# Patient Record
Sex: Female | Born: 1950 | Race: White | Hispanic: No | Smoking: Current every day smoker
Health system: Southern US, Community
[De-identification: ages and names within clinical notes are randomized; demographics above are authoritative.]

## PROBLEM LIST (undated history)

## (undated) DIAGNOSIS — T7840XA Allergy, unspecified, initial encounter: Secondary | ICD-10-CM

## (undated) DIAGNOSIS — I10 Essential (primary) hypertension: Secondary | ICD-10-CM

## (undated) DIAGNOSIS — Z460 Encounter for fitting and adjustment of spectacles and contact lenses: Secondary | ICD-10-CM

## (undated) DIAGNOSIS — C3490 Malignant neoplasm of unspecified part of unspecified bronchus or lung: Secondary | ICD-10-CM

## (undated) HISTORY — DX: Essential (primary) hypertension: I10

## (undated) HISTORY — DX: Allergy, unspecified, initial encounter: T78.40XA

## (undated) HISTORY — PX: ABDOMINAL HYSTERECTOMY: SHX81

## (undated) HISTORY — PX: TUBAL LIGATION: SHX77

## (undated) HISTORY — DX: Malignant neoplasm of unspecified part of unspecified bronchus or lung: C34.90

---

## 1997-07-14 ENCOUNTER — Ambulatory Visit: Admission: RE | Admit: 1997-07-14 | Discharge: 1997-07-14 | Payer: Self-pay | Admitting: Family Medicine

## 1997-11-15 ENCOUNTER — Other Ambulatory Visit: Admission: RE | Admit: 1997-11-15 | Discharge: 1997-11-15 | Payer: Self-pay | Admitting: Obstetrics and Gynecology

## 1998-11-23 ENCOUNTER — Other Ambulatory Visit: Admission: RE | Admit: 1998-11-23 | Discharge: 1998-11-23 | Payer: Self-pay | Admitting: Obstetrics and Gynecology

## 1999-12-28 ENCOUNTER — Other Ambulatory Visit: Admission: RE | Admit: 1999-12-28 | Discharge: 1999-12-28 | Payer: Self-pay | Admitting: Obstetrics and Gynecology

## 2001-02-26 ENCOUNTER — Other Ambulatory Visit: Admission: RE | Admit: 2001-02-26 | Discharge: 2001-02-26 | Payer: Self-pay | Admitting: Obstetrics and Gynecology

## 2002-03-16 ENCOUNTER — Other Ambulatory Visit: Admission: RE | Admit: 2002-03-16 | Discharge: 2002-03-16 | Payer: Self-pay | Admitting: Obstetrics and Gynecology

## 2004-10-10 ENCOUNTER — Ambulatory Visit (HOSPITAL_COMMUNITY): Admission: RE | Admit: 2004-10-10 | Discharge: 2004-10-10 | Payer: Self-pay | Admitting: Obstetrics and Gynecology

## 2004-11-19 ENCOUNTER — Ambulatory Visit: Payer: Self-pay | Admitting: Internal Medicine

## 2004-11-28 ENCOUNTER — Encounter (INDEPENDENT_AMBULATORY_CARE_PROVIDER_SITE_OTHER): Payer: Self-pay | Admitting: Specialist

## 2004-11-28 ENCOUNTER — Encounter: Admission: RE | Admit: 2004-11-28 | Discharge: 2004-11-28 | Payer: Self-pay | Admitting: Surgery

## 2004-11-28 ENCOUNTER — Other Ambulatory Visit: Admission: RE | Admit: 2004-11-28 | Discharge: 2004-11-28 | Payer: Self-pay | Admitting: Interventional Radiology

## 2005-11-07 ENCOUNTER — Encounter: Admission: RE | Admit: 2005-11-07 | Discharge: 2005-11-07 | Payer: Self-pay | Admitting: Surgery

## 2006-02-27 ENCOUNTER — Ambulatory Visit: Payer: Self-pay | Admitting: Internal Medicine

## 2006-05-08 ENCOUNTER — Ambulatory Visit: Payer: Self-pay | Admitting: Internal Medicine

## 2006-08-11 ENCOUNTER — Ambulatory Visit: Payer: Self-pay | Admitting: Internal Medicine

## 2006-11-04 ENCOUNTER — Ambulatory Visit: Payer: Self-pay | Admitting: Internal Medicine

## 2006-11-04 LAB — CONVERTED CEMR LAB
Cholesterol: 228 mg/dL (ref 0–200)
Total CHOL/HDL Ratio: 6.5
Triglycerides: 137 mg/dL (ref 0–149)
VLDL: 27 mg/dL (ref 0–40)

## 2006-11-06 ENCOUNTER — Ambulatory Visit: Payer: Self-pay | Admitting: Internal Medicine

## 2007-01-12 ENCOUNTER — Encounter: Admission: RE | Admit: 2007-01-12 | Discharge: 2007-01-12 | Payer: Self-pay | Admitting: Surgery

## 2007-03-16 ENCOUNTER — Ambulatory Visit: Payer: Self-pay | Admitting: Internal Medicine

## 2008-01-20 ENCOUNTER — Encounter: Admission: RE | Admit: 2008-01-20 | Discharge: 2008-01-20 | Payer: Self-pay | Admitting: Surgery

## 2008-02-05 ENCOUNTER — Encounter: Admission: RE | Admit: 2008-02-05 | Discharge: 2008-02-05 | Payer: Self-pay | Admitting: Obstetrics and Gynecology

## 2008-02-05 ENCOUNTER — Encounter (INDEPENDENT_AMBULATORY_CARE_PROVIDER_SITE_OTHER): Payer: Self-pay | Admitting: Diagnostic Radiology

## 2008-02-23 ENCOUNTER — Ambulatory Visit (HOSPITAL_BASED_OUTPATIENT_CLINIC_OR_DEPARTMENT_OTHER): Admission: RE | Admit: 2008-02-23 | Discharge: 2008-02-23 | Payer: Self-pay | Admitting: Surgery

## 2008-02-23 ENCOUNTER — Encounter: Admission: RE | Admit: 2008-02-23 | Discharge: 2008-02-23 | Payer: Self-pay | Admitting: Surgery

## 2008-02-23 ENCOUNTER — Encounter (INDEPENDENT_AMBULATORY_CARE_PROVIDER_SITE_OTHER): Payer: Self-pay | Admitting: Surgery

## 2008-04-08 ENCOUNTER — Ambulatory Visit: Payer: Self-pay | Admitting: Internal Medicine

## 2008-06-10 HISTORY — PX: BREAST SURGERY: SHX581

## 2008-06-21 ENCOUNTER — Encounter: Admission: RE | Admit: 2008-06-21 | Discharge: 2008-06-21 | Payer: Self-pay | Admitting: Surgery

## 2009-02-22 ENCOUNTER — Encounter (INDEPENDENT_AMBULATORY_CARE_PROVIDER_SITE_OTHER): Payer: Self-pay | Admitting: *Deleted

## 2009-07-10 ENCOUNTER — Encounter: Admission: RE | Admit: 2009-07-10 | Discharge: 2009-07-10 | Payer: Self-pay | Admitting: Obstetrics and Gynecology

## 2009-12-01 ENCOUNTER — Encounter: Admission: RE | Admit: 2009-12-01 | Discharge: 2009-12-01 | Payer: Self-pay | Admitting: Family Medicine

## 2010-01-17 ENCOUNTER — Other Ambulatory Visit: Admission: RE | Admit: 2010-01-17 | Discharge: 2010-01-17 | Payer: Self-pay | Admitting: Interventional Radiology

## 2010-01-17 ENCOUNTER — Encounter: Admission: RE | Admit: 2010-01-17 | Discharge: 2010-01-17 | Payer: Self-pay | Admitting: Internal Medicine

## 2010-10-23 NOTE — Assessment & Plan Note (Signed)
[facility]                            CARDIOLOGY OFFICE NOTE   Hayley Wu, Hayley Wu                      MRN:          [id number]  DATE:11/06/2006                            DOB:          1950-07-30    IDENTIFICATION:  Hayley Wu is a 60 year old woman with hypertension  and tobacco use. I saw her back in March.   When I saw her in March, I increased her Norvasc to 5.   The patient is taking it, otherwise notes no problems. She denies edema.   She is due to be seen by Dr. Gerrit Friends, her thyroid dose has been held down  and her thyroid nodules will be followed this summer.   CURRENT MEDICATIONS:  1. Multivitamin.  2. Synthroid 50 mcg daily.  3. Norvasc 5 mg daily.   REVIEW OF SYSTEMS:  Continues to smoke.   PHYSICAL EXAMINATION:  GENERAL:  The patient is no distress.  VITAL SIGNS:  Blood pressure 150/80 on arrival, 144/84 on my check,  pulse 75, weight 167 down 4 pounds from last visit.  LUNGS:  Clear.  NECK:  JVP is normal.  CARDIAC:  Regular rate and rhythm, S1, S2, no S3. No murmurs.  ABDOMEN:  Benign.  EXTREMITIES:  No edema.   IMPRESSION:  1. Hypertension. Not optimally controlled. I think that she is      probably going higher than the 140s during the day. We will      increase her Norvasc to 10 mg and follow up later this summer.  2. Dyslipidemia. Lipid panel done recently was worse than before with      an LDL of 156. Question if this is related to her thyroid. We will      check a TSH today for follow. We will be in touch with her and      where to proceed.  3. Healthcare maintenance. Counseled on smoking cessation, also I have      given her a prescription for Chantix.   Tentative follow up for six months, but I will be in touch with the  patient and she will call with her blood pressures and we will discuss  her lipids also.     Hayley Riffle, MD, [facility]  Electronically Signed    PVR/MedQ  DD: 11/06/2006  DT: 11/07/2006   Job #: [id number]   cc:   Juluis Mire, M.D.  Velora Heckler, MD

## 2010-10-23 NOTE — Assessment & Plan Note (Signed)
[facility]                            CARDIOLOGY OFFICE NOTE   NAME:Hayley Wu, Hayley Wu                      MRN:          [id number]  DATE:04/08/2008                            DOB:          1951/05/04    IDENTIFICATION:  Hayley Wu is a 60 year old woman with history of  hypertension, tobacco use, and dyslipidemia.  I last saw her back in  October 2008.   In the interval, she has done okay.  Denies chest pain.  No significant  shortness breath.  She exercises about 10-15 minutes a few times per  week.  Continues to smoke about a pack per day, which she has since age  4, has never tried to quit.   CURRENT MEDICATIONS:  1. Synthroid 25 mcg.  2. Norvasc 7.5.  3. Bactrim (sinus infection).  4. Nasonex.   PHYSICAL EXAMINATION:  GENERAL:  The patient is in no distress.  VITAL SIGNS:  Blood pressure 140/70, pulse is 76 and regular, weight  179, which is up from 171 in I last year.  NECK:  JVP is normal.  No bruits.  Prominent nodular right thyroid.  LUNGS:  Clear.  CARDIAC:  Regular rate and rhythm.  S1 and S2.  No S3.  No significant  murmurs.  ABDOMEN:  Benign.  EXTREMITIES:  No edema.   A 12-lead EKG, normal sinus rhythm, 79 beats per minute.   IMPRESSION:  1. Dyslipidemia.  We will check fasting lipids today.  2. Hypertension, fair control.  We  would continue.  3. Health care maintenance.  Counseled again extensively on smoking      cessation, gave her hand out for the program at [facility].  Also encouraged to increase her activity.  4. Thyroid nodule followed by Dr. Gerrit Friends.  No further testing for now      per her report.   I will set followup for 1 year, sooner if problems develop.     Pricilla Riffle, MD, [facility]  Electronically Signed    PVR/MedQ  DD: 04/08/2008  DT: 04/09/2008  Job #: [id number]

## 2010-10-23 NOTE — Assessment & Plan Note (Signed)
[facility]                            CARDIOLOGY OFFICE NOTE   Hayley Wu, Hayley Wu                      MRN:          [id number]  DATE:03/16/2007                            DOB:          May 13, 1951    IDENTIFICATION:  Hayley Wu is a 60 year old woman with a history of  hypertension, tobacco use and dyslipidemia.  I last saw her back in May.   In the interval, she has done okay.  She is now on the Norvasc 7.5 mg.  Her blood pressure appears to be better.  She says it is probably better  since she is down on her Synthroid.   The patient was seen by Dr. Velora Heckler.  Her Synthroid was decreased  to 25 mcg.  She says she feels her throat is a little more full with  this dose.   She is active and denies chest pain.  She is still smoking one pack per  day.   CURRENT MEDICATIONS:  1. Women's multivitamins.  2. Synthroid 25 mcg daily.  3. Norvasc 7.5 mg daily.   PHYSICAL EXAMINATION:  GENERAL:  The patient is in no distress.  VITAL SIGNS:  Blood pressure 120/70, pulse 83, weight 171 pounds, up  from 167 pounds.  NECK:  No bruits.  LUNGS:  Clear.  No rales.  HEART:  A regular rate and rhythm.  S1 and S2.  No S3.  ABDOMEN:  Benign.  EXTREMITIES:  No edema.   IMPRESSION:  1. Hypertension, under adequate control:  Would continue.  She does      not want to increase her dose further.  2. Dyslipidemia:  Her last lipid panel was not very good back in May.      An LDL of 157,  HDL 35, total of 228.  We talked about diet.      Again, I would put this behind stopping smoking.  Will reassess      with a Lipo-Medi next summer.  3. Health care maintenance:  Counseled at length about tobacco and      also gave her information regarding a program at [facility] for tobacco cessation.   I will set followup for the end of October with a Lipo-Medi the same  day.     Pricilla Riffle, MD, [facility]  Electronically Signed    PVR/MedQ  DD:  03/16/2007  DT: 03/17/2007  Job #: [id number]   cc:   Juluis Mire, M.D.  Velora Heckler, MD

## 2010-10-23 NOTE — Op Note (Signed)
NAME:  Hayley Wu, Hayley Wu               ACCOUNT NO.:  [id number]   MEDICAL RECORD NO.:  [id number]          PATIENT TYPE:  AMB   LOCATION:  [facility]                          FACILITY:  [facility]   PHYSICIAN:  Velora Heckler, MD      DATE OF BIRTH:  1951-03-25   DATE OF PROCEDURE:  DATE OF DISCHARGE:                               OPERATIVE REPORT   PREOPERATIVE DIAGNOSIS:  Granular cell tumor, right breast.   POSTOPERATIVE DIAGNOSIS:  Granular cell tumor, right breast.   PROCEDURE:  Right partial mastectomy with wire localization.   SURGEON:  Velora Heckler, MD, FACS   ANESTHESIA:  General Per Dr. Autumn Patty.   ESTIMATED BLOOD LOSS:  Minimal.   PREPARATION:  Betadine.   COMPLICATIONS:  None.   INDICATIONS:  The patient is a 60 year old white female followed in my  practice for bilateral thyroid nodules.  The patient had a routine  screening mammogram in late August 2009.  This showed an abnormality in  the upper portion of the right breast.  Core needle biopsy was performed  and shows a granular cell tumor.  The patient now comes to surgery for  definitive excision with wire localization.   OPERATIVE PROCEDURE:  Procedure was done in OR #8 at the [facility].  The patient was brought to the operating room, placed  in supine position on the operating room table.  Following  administration of general anesthesia, the patient was positioned and  then prepped and draped in usual strict aseptic fashion.  Previous  guidewire has been inserted from the upper medial quadrant of the right  breast.  Skin is incised with a #15 blade in a curvilinear fashion.  Dissection was carried into the subcutaneous tissues and hemostasis  obtained with the electrocautery.  A core of tissue was excised around  the guidewire until nearly reaching the chest wall.  At this point, the  mass is palpable.  It measures approximately 1.5 cm in greatest  dimension.  It appears to be fixed to the  underlying pectoralis major  muscle.  Using the electrocautery for hemostasis, margin of tissue was  excised surrounding the mass including approximately a 1 cm segment of  pectoralis major muscle.  The entire mass was excised in this fashion.  Guidewire appears to penetrate the mass and extends into the pectoralis  major muscle.  Entire specimen was excised and a mammogram of this  specimen was provided to radiology using the Faxitron.  This confirms  the presence of the lesion with the previous marking clip and guidewire  to be present within the specimen.  This was reviewed by Dr. Anselmo Pickler.   Good hemostasis was noted throughout the wound.  Subcutaneous tissues  were closed with interrupted 3-0 Vicryl sutures.  Skin was closed with a  running 4-0 subcuticular Vicryl suture.  Wound was dressed with  Dermabond as she has a latex allergy.   The specimen was oriented with sutures marking the deep margin, the  medial margin, and the lateral margin.  It was submitted to pathology  for  review.   The patient was awakened from anesthesia and brought to the recovery  room in stable condition.  The patient tolerated the procedure well.      Velora Heckler, MD  Electronically Signed     TMG/MEDQ  D:  02/23/2008  T:  02/24/2008  Job:  [id number]   cc:   Daryl Eastern, M.D.  Juluis Mire, M.D.

## 2010-10-26 NOTE — Assessment & Plan Note (Signed)
[facility]                            CARDIOLOGY OFFICE NOTE   Hayley Wu, Hayley Wu                      MRN:          [id number]  DATE:08/11/2006                            DOB:          12-Nov-1950    Hayley Wu is a 60 year old woman with hypertension and tobacco use.  I saw her in November.   When i saw her last, I had her on Norvasc 2.5 per day.  Actually on my  check, her blood pressure was a little high (158/88).   In the interval, she has been doing okay.  She still smokes, trying to  cut back.   CURRENT MEDICATIONS:  1. Norvasc 2.5.  2. Women multivitamin.  3. Synthroid 50 mcg.   REVIEW OF SYSTEMS:  Being followed by Dr. Gerrit Friends for a thyroid nodule.  Lipids also done by Dr. Arelia Sneddon.   PHYSICAL EXAMINATION:  On exam, the patient is in no distress.  Blood  pressure 149/85, pulse is 94.  On my check, blood pressure 136/82, pulse  94.  Weight 171.  LUNGS:  Clear.  NECK:  Thyroid slightly prominent.  CARDIAC EXAM:  Regular rate and rhythm.  S1-S2.  No S3, no murmurs.  ABDOMEN:  Benign.  EXTREMITIES:  No edema.  Good pulses.   IMPRESSION:  1. Hypertension:  I would increase her Norvasc to 5 and really treat      it with a goal of 110 to 130 for a systolic.  We will follow up in      a few months.  2. Thyroid:  Being followed by Dr. Gerrit Friends.  3. Health care maintenance:  We will follow up with Dr. Arelia Sneddon and her      lipid panel.  Encouraged her to continue staying active with      walking.     Pricilla Riffle, MD, [facility]  Electronically Signed    PVR/MedQ  DD: 08/11/2006  DT: 08/12/2006  Job #: [id number]   cc:   Juluis Mire, M.D.

## 2010-10-26 NOTE — Assessment & Plan Note (Signed)
[facility]                              CARDIOLOGY OFFICE NOTE   Hayley Wu, Hayley Wu Hayley Wu                      MRN:          [id number]  DATE:02/27/2006                            DOB:          [birth year]    IDENTIFICATION:  Ms. Hayley Wu is a 60 year old who was referred by Dr.  Arelia Sneddon for evaluation of hypertension.   HISTORY OF PRESENT ILLNESS:  The patient has been seen by Dr. Arelia Sneddon in the  past.  Blood pressure has been marked to be elevated on a few occasions  2006, September, at 156/92, another visit in 2007 168/90 and then 156/92.  Note here by Dr. Juanda Chance Lina Sar), blood pressure in June of 206 was  110/70.   On talking with the patient, she is under increased stress.  Otherwise, she  is active.  She played some golf, denies shortness of breath, no chest  pressure.   ALLERGIES:  1. AMOXICILLIN.  2. E-MYCIN.  3. PREDNISONE.   CURRENT MEDICATIONS:  Synthroid 50 mcg daily.   PAST MEDICAL HISTORY:  1. Hypothyroidism.  2. Hypertension.   PAST SURGICAL HISTORY:  1. Status post tubal ligation.  2. Status post hysterectomy, oophorectomy 1997.   SOCIAL HISTORY:  Patient is recently widowed this summer.  She smokes about  a pack per day for 35 years, does not drink.   FAMILY HISTORY:  Father with MI at age 96, brother with an MI at age 6,  mother died of lung cancer at age 75, father died, again, of heart problems  at age 11.  Four sisters, two other brothers.   REVIEW OF SYSTEMS:  Recent lipid panel:  Total cholesterol 201,  triglycerides of 165, HDL of 40, LDL of 128.  Talking to the patient about  her diet, breakfast is oatmeal with brown sugar, lunch is usually a  sandwich, dinner grilled meat.  She does eat junk food in between though.   PHYSICAL EXAM:  On exam, the patient is in no distress.  Blood pressure  162/86 in left arm, pulse is 87, weight 181.  NECK:  No bruits, thyroid is prominent, JVP is normal.  CARDIAC EXAM:   Regular rate and rhythm, S1, S2.  No S3, no significant  murmurs.  LUNGS:  Clear.  ABDOMEN:  Benign.  No hepatomegaly.  EXTREMITIES:  Good distal pulses, no edema.   A 12 lead EKG shows normal sinus rhythm at 87 b.p.m.   IMPRESSION:  1. Hypertension.  Again, she is high today and I think it has been on      several occasions.  I would recommend beginning therapy with Norvasc      2.5 mg daily, I will follow up in about six weeks' time.  Again, if she      were to get the weight down that may improve her blood pressure, but I      would treat for now.  2. Dyslipidemia.  In the setting with family history of hypertension again      cannot be ignored, along with her family history as well.  She  would      like to try diet changes and lifestyle changes and I encourage this.  I      have given her American Heart Association web site and the information      through them.  She is entering some diet program at work that is      starting up.  I would like to check the lipids in about 4-6 months'      time.   Again, follow up as noted above.                                Pricilla Riffle, MD, [facility]    PVR/MedQ  DD:  02/27/2006  DT:  03/01/2006  Job #:  [id number]   cc:   Juluis Mire, M.D.

## 2010-10-26 NOTE — Assessment & Plan Note (Signed)
[facility]                            CARDIOLOGY OFFICE NOTE   Hayley, Wu Hayley Wu                      MRN:          [id number]  DATE:05/08/2006                            DOB:          [birth year]    IDENTIFICATION:  Hayley Wu is a woman I saw back in September.  She  is a 60 year old with history of hypertension.   When I saw her last, I put her on a little bit of Norvasc.  She seems to  have tolerated it well.  Denies edema.   Note, at work she actually is involved in quite a bit of preventative  measures.  They have a workout facility now.  There are nutrition  classes.  She actually has lost quite a bit of weight.   She is without complaints.   CURRENT MEDICATIONS:  1. Norvasc 2.5 daily.  2. Multivitamin.  3. Synthroid 50 mcg daily.   PHYSICAL EXAMINATION:  Patient is in no distress.  Blood pressure in arrival 128/84, on my check 158/88.  Pulse is 80.  Weight 168, down from 181 on my check last visit.  LUNGS:  Clear.  CARDIAC:  Regular rate and rhythm.  S1 and S2.  No S3.  No murmurs.  ABDOMEN:  Benign.  EXTREMITIES:  No edema.   IMPRESSION:  1. Hypertension.  I think it is better overall.  I would continue on      the current regimen and follow up in several months.  Again, she      can have this checked locally as well.  2. Dyslipidemia.  She is making changes in diet.  Her weight has gone      down.  I would not recheck for now.  3. I will set to see her back in the spring, sooner if problems      develop.     Pricilla Riffle, MD, [facility]  Electronically Signed    PVR/MedQ  DD: 05/08/2006  DT: 05/09/2006  Job #: [id number]   cc:   Juluis Mire, M.D.

## 2011-02-13 ENCOUNTER — Other Ambulatory Visit: Payer: Self-pay | Admitting: Internal Medicine

## 2011-02-13 DIAGNOSIS — E042 Nontoxic multinodular goiter: Secondary | ICD-10-CM

## 2011-03-04 ENCOUNTER — Ambulatory Visit
Admission: RE | Admit: 2011-03-04 | Discharge: 2011-03-04 | Disposition: A | Discharge status: Home / Self Care | Payer: BC Managed Care – PPO | Source: Ambulatory Visit | Attending: Internal Medicine | Admitting: Internal Medicine

## 2011-03-04 DIAGNOSIS — E042 Nontoxic multinodular goiter: Secondary | ICD-10-CM

## 2011-03-13 LAB — CBC
HCT: 47.2 — ABNORMAL HIGH
MCHC: 34.2
MCV: 88.2
RBC: 5.35 — ABNORMAL HIGH
RDW: 13.1

## 2011-03-13 LAB — URINALYSIS, ROUTINE W REFLEX MICROSCOPIC
Hgb urine dipstick: NEGATIVE
Protein, ur: NEGATIVE
Specific Gravity, Urine: 1.003 — ABNORMAL LOW
Urobilinogen, UA: 0.2

## 2011-03-13 LAB — BASIC METABOLIC PANEL
BUN: 8
Creatinine, Ser: 0.61
GFR calc Af Amer: >60
Glucose, Bld: 98
Potassium: 4
Sodium: 142

## 2011-03-13 LAB — DIFFERENTIAL
Lymphocytes Relative: 27
Monocytes Absolute: 0.5
Monocytes Relative: 5
Neutrophils Relative %: 67

## 2011-03-13 LAB — URINE MICROSCOPIC-ADD ON

## 2011-03-13 LAB — PROTIME-INR
INR: 0.9
Prothrombin Time: 12.5

## 2011-09-13 ENCOUNTER — Ambulatory Visit (INDEPENDENT_AMBULATORY_CARE_PROVIDER_SITE_OTHER): Payer: BC Managed Care – PPO | Admitting: Physician Assistant

## 2011-09-13 VITALS — BP 151/81 | HR 94 | Temp 98.3°F | Resp 16 | Ht 63.0 in | Wt 174.0 lb

## 2011-09-13 DIAGNOSIS — H8090 Unspecified otosclerosis, unspecified ear: Secondary | ICD-10-CM

## 2011-09-13 DIAGNOSIS — H6690 Otitis media, unspecified, unspecified ear: Secondary | ICD-10-CM

## 2011-09-13 DIAGNOSIS — H9209 Otalgia, unspecified ear: Secondary | ICD-10-CM

## 2011-09-13 MED ORDER — LEVOFLOXACIN 500 MG PO TABS: 500.0000 mg | ORAL_TABLET | Freq: Every day | ORAL | Status: AC

## 2011-09-13 NOTE — Progress Notes (Signed)
Patient ID: Hayley Wu MRN: [id number], DOB: June 08, 1951, 61 y.o. Date of Encounter: 09/13/2011, 7:10 PM  Primary Physician: No primary provider on file.  Chief Complaint:  Chief Complaint  Patient presents with  . Otalgia    x 1 days    HPI: 61 y.o. year old female presents with a 2 day history of left otalgia. Symptoms began with nasal congestion, sore throat, and cough. Afebrile. Nasal congestion thick and green/yellow. Cough is mild and nonproductive. Ear painful and  feels full, leading to sensation of muffled hearing. Over night she noticed some drainage coming from the left ear. Left sided hearing is muffled. She does have a history of a perforated left TM years ago, but stats she was told this resolved without surgery. Has tried OTC cold preps without success. No GI complaints. Appetite normal. Smokes tobacco daily.  No sick contacts, recent antibiotics, or recent travels.    No past medical history on file.   Home Meds: Prior to Admission medications   Medication Sig Start Date End Date Taking? Authorizing Provider  amLODipine (NORVASC) 5 MG tablet Take 7.5 mg by mouth daily.   Yes Historical Provider, MD  levofloxacin (LEVAQUIN) 500 MG tablet Take 1 tablet (500 mg total) by mouth daily. 09/13/11 09/23/11  Sondra Barges, PA-C    Allergies:  Allergies  Allergen Reactions  . Amoxicillin   . Erythromycin Diarrhea  . Prednisone     History   Social History  . Marital Status: Widowed    Spouse Name: N/A    Number of Children: N/A  . Years of Education: N/A   Occupational History  . Not on file.   Social History Main Topics  . Smoking status: Current Everyday Smoker  . Smokeless tobacco: Not on file  . Alcohol Use: Not on file  . Drug Use: Not on file  . Sexually Active: Not on file   Other Topics Concern  . Not on file   Social History Narrative  . No narrative on file     Review of Systems: Constitutional: negative for chills, fever, night sweats or  weight changes HEENT: see above Respiratory: negative for hemoptysis, wheezing, or shortness of breath Abdominal: negative for abdominal pain, nausea, vomiting or diarrhea Dermatological: negative for rash Neurologic: negative for headache   Physical Exam: Blood pressure 151/81, pulse 94, temperature 98.3 F (36.8 C), temperature source Oral, resp. rate 16, height 5\' 3"  (1.6 m), weight 174 lb (78.926 kg)., Body mass index is 30.82 kg/(m^2). General: Well developed, well nourished, in no acute distress. Head: Normocephalic, atraumatic, eyes without discharge, sclera non-icteric, nares are congested. Bilateral auditory canals clear. Left TM with perforation, erythematous, dull, and purulent effusion behind. Contralateral TM pearly grey with reflective cone of light, no effusion or perforation visualized. Oral cavity moist, dentition normal. Posterior pharynx with post nasal drip and mild erythema. No peritonsillar abscess or tonsillar exudate. Neck: Supple. No thyromegaly. Full ROM. No lymphadenopathy. Lungs: Clear bilaterally to auscultation without wheezes, rales, or rhonchi. Breathing is unlabored.  Heart: RRR with S1 S2. No murmurs, rubs, or gallops appreciated. Msk:  Strength and tone normal for age. Extremities: No clubbing or cyanosis. No edema. Neuro: Alert and oriented X 3. Moves all extremities spontaneously. CNII-XII grossly in tact. Psych:  Responds to questions appropriately with a normal affect.     ASSESSMENT AND PLAN:  61 y.o. year old female with acute otitis media of left ear with perforation. -Levaquin 500 mg #10 1 po  daily no RF -Mucinex  -Tylenol prn -Rest/fluids -Recheck 48-72 hours to check for healing of perforation, if persists consider ENT -RTC precautions  Signed, Eula Listen, PA-C 09/13/2011 7:10 PM

## 2011-09-23 ENCOUNTER — Ambulatory Visit (INDEPENDENT_AMBULATORY_CARE_PROVIDER_SITE_OTHER): Payer: BC Managed Care – PPO | Admitting: Internal Medicine

## 2011-09-23 VITALS — BP 147/81 | HR 88 | Temp 98.3°F | Resp 16 | Ht 63.0 in | Wt 177.8 lb

## 2011-09-23 DIAGNOSIS — I10 Essential (primary) hypertension: Secondary | ICD-10-CM | POA: Insufficient documentation

## 2011-09-23 DIAGNOSIS — J309 Allergic rhinitis, unspecified: Secondary | ICD-10-CM

## 2011-09-23 DIAGNOSIS — H6980 Other specified disorders of Eustachian tube, unspecified ear: Secondary | ICD-10-CM

## 2011-09-23 MED ORDER — FLUTICASONE PROPIONATE 50 MCG/ACT NA SUSP: 2.0000 | Freq: Every day | NASAL | Status: DC

## 2011-09-23 NOTE — Patient Instructions (Signed)
Take your Zyrtec twice daily for 3 days.  Use your Flonase everyday.  It is fine to use Nasal Saline spray if you wish.  Your symptoms should improve within 7 days.  Allergies, Generic Allergies may happen from anything your body is sensitive to. This may be food, medicines, pollens, chemicals, and nearly anything around you in everyday life that produces allergens. An allergen is anything that causes an allergy producing substance. Heredity is often a factor in causing these problems. This means you may have some of the same allergies as your parents. Food allergies happen in all age groups. Food allergies are some of the most severe and life threatening. Some common food allergies are cow's milk, seafood, eggs, nuts, wheat, and soybeans. SYMPTOMS   Swelling around the mouth.   An itchy red rash or hives.   Vomiting or diarrhea.   Difficulty breathing.  SEVERE ALLERGIC REACTIONS ARE LIFE-THREATENING. This reaction is called anaphylaxis. It can cause the mouth and throat to swell and cause difficulty with breathing and swallowing. In severe reactions only a trace amount of food (for example, peanut oil in a salad) may cause death within seconds. Seasonal allergies occur in all age groups. These are seasonal because they usually occur during the same season every year. They may be a reaction to molds, grass pollens, or tree pollens. Other causes of problems are house dust mite allergens, pet dander, and mold spores. The symptoms often consist of nasal congestion, a runny itchy nose associated with sneezing, and tearing itchy eyes. There is often an associated itching of the mouth and ears. The problems happen when you come in contact with pollens and other allergens. Allergens are the particles in the air that the body reacts to with an allergic reaction. This causes you to release allergic antibodies. Through a chain of events, these eventually cause you to release histamine into the blood stream.  Although it is meant to be protective to the body, it is this release that causes your discomfort. This is why you were given anti-histamines to feel better. If you are unable to pinpoint the offending allergen, it may be determined by skin or blood testing. Allergies cannot be cured but can be controlled with medicine. Hay fever is a collection of all or some of the seasonal allergy problems. It may often be treated with simple over-the-counter medicine such as diphenhydramine. Take medicine as directed. Do not drink alcohol or drive while taking this medicine. Check with your caregiver or package insert for child dosages. If these medicines are not effective, there are many new medicines your caregiver can prescribe. Stronger medicine such as nasal spray, eye drops, and corticosteroids may be used if the first things you try do not work well. Other treatments such as immunotherapy or desensitizing injections can be used if all else fails. Follow up with your caregiver if problems continue. These seasonal allergies are usually not life threatening. They are generally more of a nuisance that can often be handled using medicine. HOME CARE INSTRUCTIONS   If unsure what causes a reaction, keep a diary of foods eaten and symptoms that follow. Avoid foods that cause reactions.   If hives or rash are present:   Take medicine as directed.   You may use an over-the-counter antihistamine (diphenhydramine) for hives and itching as needed.   Apply cold compresses (cloths) to the skin or take baths in cool water. Avoid hot baths or showers. Heat will make a rash and itching worse.  If you are severely allergic:   Following a treatment for a severe reaction, hospitalization is often required for closer follow-up.   Wear a medic-alert bracelet or necklace stating the allergy.   You and your family must learn how to give adrenaline or use an anaphylaxis kit.   If you have had a severe reaction, always  carry your anaphylaxis kit or EpiPen with you. Use this medicine as directed by your caregiver if a severe reaction is occurring. Failure to do so could have a fatal outcome.  SEEK MEDICAL CARE IF:  You suspect a food allergy. Symptoms generally happen within 30 minutes of eating a food.   Your symptoms have not gone away within 2 days or are getting worse.   You develop new symptoms.   You want to retest yourself or your child with a food or drink you think causes an allergic reaction. Never do this if an anaphylactic reaction to that food or drink has happened before. Only do this under the care of a caregiver.  SEEK IMMEDIATE MEDICAL CARE IF:   You have difficulty breathing, are wheezing, or have a tight feeling in your chest or throat.   You have a swollen mouth, or you have hives, swelling, or itching all over your body.   You have had a severe reaction that has responded to your anaphylaxis kit or an EpiPen. These reactions may return when the medicine has worn off. These reactions should be considered life threatening.  MAKE SURE YOU:   Understand these instructions.   Will watch your condition.   Will get help right away if you are not doing well or get worse.  Document Released: 08/20/2002 Document Revised: 05/16/2011 Document Reviewed: 01/25/2008 [facility] Patient Information 2012 [place], [place].

## 2011-09-23 NOTE — Progress Notes (Signed)
  Subjective:    Patient ID: Hayley Wu, female    DOB: 10/26/1950, 61 y.o.   MRN: [id number]  HPI  Hayley Wu is here for a follow-up on her left ear infection.  She states she has popping in her ears at times, no drainage or fever.  She has a history of allergies and has not been taking any antihistamines for this but has used Flonase in the past with good results.  She has no cough or wheezing in her chest.  She continues to smoke.    Review of Systems  All other systems reviewed and are negative.  Negative except as noted in HPI     Objective:   Physical Exam  Vitals reviewed. Constitutional: She is oriented to person, place, and time. She appears well-developed and well-nourished.  HENT:  Head: Normocephalic.  Right Ear: External ear normal.  Mouth/Throat: Oropharynx is clear and moist. No oropharyngeal exudate.       Left TM is clear and intact, slightly injected but no perf seen  Eyes:       Conjunctiva injected  Neck: Neck supple.  Cardiovascular: Normal rate, regular rhythm and normal heart sounds.   Pulmonary/Chest: Effort normal and breath sounds normal. She has no wheezes. She has no rales.  Lymphadenopathy:    She has no cervical adenopathy.  Neurological: She is alert and oriented to person, place, and time.  Skin: Skin is warm and dry.  Psychiatric: She has a normal mood and affect. Her behavior is normal.          Assessment & Plan:  Left Otitis Media has resolved ETD:  Flonase prescribed. Allergic Rhinitis:  Advised to take Zyrtec 10 mg BID for 2-3 days then once daily for at least 2 weeks.  RTC if symptoms persist despite medical treatment.  AVS printed and given pt

## 2011-10-18 ENCOUNTER — Other Ambulatory Visit: Payer: Self-pay | Admitting: Internal Medicine

## 2011-10-18 DIAGNOSIS — E042 Nontoxic multinodular goiter: Secondary | ICD-10-CM

## 2011-10-23 ENCOUNTER — Ambulatory Visit
Admission: RE | Admit: 2011-10-23 | Discharge: 2011-10-23 | Disposition: A | Discharge status: Home / Self Care | Payer: BC Managed Care – PPO | Source: Ambulatory Visit | Attending: Internal Medicine | Admitting: Internal Medicine

## 2011-10-23 DIAGNOSIS — E042 Nontoxic multinodular goiter: Secondary | ICD-10-CM

## 2012-08-07 ENCOUNTER — Ambulatory Visit: Payer: BC Managed Care – PPO | Admitting: Family Medicine

## 2012-08-07 VITALS — BP 135/77 | HR 98 | Temp 98.7°F | Resp 18 | Ht 63.5 in | Wt 186.0 lb

## 2012-08-07 DIAGNOSIS — R0981 Nasal congestion: Secondary | ICD-10-CM

## 2012-08-07 DIAGNOSIS — H9202 Otalgia, left ear: Secondary | ICD-10-CM

## 2012-08-07 DIAGNOSIS — H9209 Otalgia, unspecified ear: Secondary | ICD-10-CM

## 2012-08-07 DIAGNOSIS — J3489 Other specified disorders of nose and nasal sinuses: Secondary | ICD-10-CM

## 2012-08-07 DIAGNOSIS — J019 Acute sinusitis, unspecified: Secondary | ICD-10-CM

## 2012-08-07 MED ORDER — LEVOFLOXACIN 500 MG PO TABS: 500.0000 mg | ORAL_TABLET | Freq: Every day | ORAL | Status: DC

## 2012-08-07 NOTE — Progress Notes (Signed)
[facility]:  Office Visit  Chief Complaint:  Chief Complaint  Patient presents with  . Otalgia    1 week  . Cough  . URI  . Sore Throat    HPI: Hayley Wu is a 62 y.o. female who complains of  1 week h/o left ear pain, sinus congestion, tickle in back of throat, denies fevers, chills. [name] was sick, he is 4 months. Tried tussin , nasal spray and also dimetapp. + tobacco use.  Past Medical History  Diagnosis Date  . Allergy   . Hypertension    Past Surgical History  Procedure Laterality Date  . Breast surgery    . Abdominal hysterectomy     History   Social History  . Marital Status: Widowed    Spouse Name: N/A    Number of Children: N/A  . Years of Education: N/A   Social History Main Topics  . Smoking status: Current Every Day Smoker  . Smokeless tobacco: None  . Alcohol Use: No  . Drug Use: No  . Sexually Active: No   Other Topics Concern  . None   Social History Narrative  . None   Family History  Problem Relation Age of Onset  . Cancer Mother   . Hypertension Father   . Heart attack Father   . Hypertension Sister   . Hypertension Brother   . Heart attack Brother   . Cancer Daughter     thyroid  . Diabetes Maternal Grandfather    Allergies  Allergen Reactions  . Amoxicillin   . Erythromycin Diarrhea  . Prednisone    Prior to Admission medications   Medication Sig Start Date End Date Taking? Authorizing Provider  amLODipine (NORVASC) 5 MG tablet Take 7.5 mg by mouth daily.   Yes Historical Provider, MD  fluticasone (FLONASE) 50 MCG/ACT nasal spray Place 2 sprays into the nose daily. 09/23/11 09/22/12  Rickard Patience, PA-C     ROS: The patient denies fevers, chills, night sweats, unintentional weight loss, chest pain, palpitations, wheezing, dyspnea on exertion, nausea, vomiting, abdominal pain, dysuria, hematuria, melena, numbness, weakness, or tingling.   All other systems have been reviewed and were otherwise  negative with the exception of those mentioned in the HPI and as above.    PHYSICAL EXAM: Filed Vitals:   08/07/12 1508  BP: 135/77  Pulse: 98  Temp: 98.7 F (37.1 C)  Resp: 18   Filed Vitals:   08/07/12 1508  Height: 5' 3.5" (1.613 m)  Weight: 186 lb (84.369 kg)   Body mass index is 32.43 kg/(m^2).  General: Alert, no acute distress HEENT:  Normocephalic, atraumatic, oropharynx patent. + scarring left TM, no ear infection. + sinus tenderness, erythematous throat.  Cardiovascular:  Regular rate and rhythm, no rubs murmurs or gallops.  No Carotid bruits, radial pulse intact. No pedal edema.  Respiratory: Clear to auscultation bilaterally.  No wheezes, rales, or rhonchi.  No cyanosis, no use of accessory musculature GI: No organomegaly, abdomen is soft and non-tender, positive bowel sounds.  No masses. Skin: No rashes. Neurologic: Facial musculature symmetric. Psychiatric: Patient is appropriate throughout our interaction. Lymphatic: No cervical lymphadenopathy Musculoskeletal: Gait intact.   LABS: Results for orders placed during the hospital encounter of 02/23/08  URINALYSIS, ROUTINE W REFLEX MICROSCOPIC      Result Value Range   Color, Urine YELLOW     APPearance CLEAR     Specific Gravity, Urine 1.003 (*)    pH 7.0  Glucose, UA NEGATIVE     Hgb urine dipstick NEGATIVE     Bilirubin Urine NEGATIVE     Ketones, ur NEGATIVE     Protein, ur NEGATIVE     Urobilinogen, UA 0.2     Nitrite NEGATIVE     Leukocytes, UA TRACE (*)   URINE MICROSCOPIC-ADD ON      Result Value Range   Squamous Epithelial / LPF RARE     WBC, UA 0-2     RBC / HPF 0-2    BASIC METABOLIC PANEL      Result Value Range   Sodium 142     Potassium 4.0     Chloride 106     CO2 27     Glucose, Bld 98     BUN 8     Creatinine, Ser 0.61     Calcium 9.4     GFR calc non Af Amer >60     GFR calc Af Amer       Value: >60            The eGFR has been calculated     using the MDRD equation.      This calculation has not been     validated in all clinical  CBC      Result Value Range   WBC 10.4     RBC 5.35 (*)    Hemoglobin 16.1 (*)    HCT 47.2 (*)    MCV 88.2     MCHC 34.2     RDW 13.1     Platelets 196    DIFFERENTIAL      Result Value Range   Neutrophils Relative 67     Neutro Abs 7.0     Lymphocytes Relative 27     Lymphs Abs 2.8     Monocytes Relative 5     Monocytes Absolute 0.5     Eosinophils Relative 1     Eosinophils Absolute 0.1     Basophils Relative 0     Basophils Absolute 0.0    PROTIME-INR      Result Value Range   Prothrombin Time 12.5     INR 0.9       EKG/XRAY:   Primary read interpreted by Dr. Conley Rolls at [facility].   ASSESSMENT/PLAN: Encounter Diagnoses  Name Primary?  . Acute sinusitis Yes  . Sinus congestion   . Otalgia of left ear    Rx levaquin Patient to use her flonase F/u prn    Donna Snooks PHUONG, DO 08/07/2012 3:23 PM

## 2012-11-11 ENCOUNTER — Other Ambulatory Visit: Payer: Self-pay | Admitting: Internal Medicine

## 2012-11-11 DIAGNOSIS — E049 Nontoxic goiter, unspecified: Secondary | ICD-10-CM

## 2012-11-23 ENCOUNTER — Ambulatory Visit
Admission: RE | Admit: 2012-11-23 | Discharge: 2012-11-23 | Disposition: A | Discharge status: Home / Self Care | Payer: BC Managed Care – PPO | Source: Ambulatory Visit | Attending: Internal Medicine | Admitting: Internal Medicine

## 2012-11-23 DIAGNOSIS — E049 Nontoxic goiter, unspecified: Secondary | ICD-10-CM

## 2013-11-23 ENCOUNTER — Ambulatory Visit (INDEPENDENT_AMBULATORY_CARE_PROVIDER_SITE_OTHER): Payer: Self-pay | Admitting: Family Medicine

## 2013-11-23 ENCOUNTER — Ambulatory Visit: Payer: Self-pay

## 2013-11-23 ENCOUNTER — Ambulatory Visit (INDEPENDENT_AMBULATORY_CARE_PROVIDER_SITE_OTHER): Payer: Self-pay

## 2013-11-23 VITALS — BP 144/82 | HR 106 | Temp 98.3°F | Resp 18 | Wt 191.6 lb

## 2013-11-23 DIAGNOSIS — S59909A Unspecified injury of unspecified elbow, initial encounter: Secondary | ICD-10-CM

## 2013-11-23 DIAGNOSIS — S59919A Unspecified injury of unspecified forearm, initial encounter: Secondary | ICD-10-CM

## 2013-11-23 DIAGNOSIS — S59912A Unspecified injury of left forearm, initial encounter: Secondary | ICD-10-CM

## 2013-11-23 DIAGNOSIS — S6990XA Unspecified injury of unspecified wrist, hand and finger(s), initial encounter: Secondary | ICD-10-CM

## 2013-11-23 DIAGNOSIS — S59911A Unspecified injury of right forearm, initial encounter: Secondary | ICD-10-CM

## 2013-11-23 DIAGNOSIS — S52599A Other fractures of lower end of unspecified radius, initial encounter for closed fracture: Secondary | ICD-10-CM

## 2013-11-23 DIAGNOSIS — S52501A Unspecified fracture of the lower end of right radius, initial encounter for closed fracture: Secondary | ICD-10-CM

## 2013-11-23 MED ORDER — IBUPROFEN 200 MG PO TABS: 400.0000 mg | ORAL_TABLET | Freq: Once | ORAL | Status: DC

## 2013-11-23 MED ORDER — HYDROCODONE-ACETAMINOPHEN 5-325 MG PO TABS: 1.0000 | ORAL_TABLET | Freq: Three times a day (TID) | ORAL | Status: DC | PRN

## 2013-11-23 NOTE — Patient Instructions (Signed)
Leave the splint in place until you are seen by orthopedics- I will get this scheduled in the next day or two.  I will give you a call tomorrow. If you do not hear from me please let me know!  You can use the vicodin as needed for pain- remember this will make you sleepy

## 2013-11-23 NOTE — Progress Notes (Signed)
[facility] [address], [place] [id number] [phone]  Date:  11/23/2013   Name:  Hayley MeringMary E Cappella   DOB:  1950/07/15   MRN:  [id number]  PCP:  No PCP Per Patient    Chief Complaint: Arm Injury   History of Present Illness:  Hayley Wu is a 63 y.o. very pleasant female patient who presents with the following:  About 2 hours ago she tripped, fell back and landed on her left wrist in a FOOSH type injury. She is otherwise uninjured.  She takes norvasc for her HTN, is otherwise generally healthy.  She is afraid the wrist is broken- it is swollen and very painful.    Patient Active Problem List   Diagnosis Date Noted  . Hypertension 09/23/2011    Past Medical History  Diagnosis Date  . Allergy   . Hypertension     Past Surgical History  Procedure Laterality Date  . Breast surgery    . Abdominal hysterectomy      History  Substance Use Topics  . Smoking status: Current Every Day Smoker  . Smokeless tobacco: Not on file  . Alcohol Use: No    Family History  Problem Relation Age of Onset  . Cancer Mother   . Hypertension Father   . Heart attack Father   . Hypertension Sister   . Hypertension Brother   . Heart attack Brother   . Cancer Daughter     thyroid  . Diabetes Maternal Grandfather     Allergies  Allergen Reactions  . Amoxicillin   . Erythromycin Diarrhea  . Prednisone     Medication list has been reviewed and updated.  Current Outpatient Prescriptions on File Prior to Visit  Medication Sig Dispense Refill  . amLODipine (NORVASC) 5 MG tablet Take 7.5 mg by mouth daily.      . fluticasone (FLONASE) 50 MCG/ACT nasal spray Place 2 sprays into the nose daily.  16 g  3   No current facility-administered medications on file prior to visit.    Review of Systems:  As per HPI- otherwise negative.   Physical Examination: Filed Vitals:   11/23/13 1617  BP: 144/82  Pulse: 106  Temp: 98.3 F (36.8 C)  Resp: 18    Filed Vitals:   11/23/13 1617  Weight: 191 lb 9.6 oz (86.909 kg)   Body mass index is 33.4 kg/(m^2). Ideal Body Weight:    GEN: WDWN, NAD, Non-toxic, A & O x 3, overweight, looks well HEENT: Atraumatic, Normocephalic. Neck supple. No masses, No LAD. Ears and Nose: No external deformity. CV: RRR, No M/G/R. No JVD. No thrill. No extra heart sounds. PULM: CTA B, no wheezes, crackles, rhonchi. No retractions. No resp. distress. No accessory muscle use. EXTR: No c/c/e NEURO Normal gait.  PSYCH: Normally interactive. Conversant. Not depressed or anxious appearing.  Calm demeanor.  Left forearm: she is tender over the distal forearm and appears to have a broken wrist.  Normal perfusion and cap refill, normal pulses in his wrist and normal sensation of the hand No evidence of shoulder or elbow fracture or dislocation  [facility] reading (PRIMARY) by  Dr. Patsy Lageropland. Left wrist: angulated, communiated distal radius fracture Left forearm: as above, elbow is ok  LEFT WRIST - COMPLETE 3+ VIEW  COMPARISON: None.  FINDINGS:  Reverse Barton fracture of the distal radius with anterior  displacement of distal radial fracture fragments and the carpus. The  fracture is comminuted with transverse component  in the metaphysis  and sagittal/oblique components extending into the distal articular  surface of the radius.  Osteoarthritis of the first carpometacarpal articulation. Mild  lucency centrally in the scaphoid probably represents a geode.  IMPRESSION:  1. Reverse Barton fracture.  LEFT FOREARM - 2 VIEW  COMPARISON: None.  FINDINGS:  There is a comminuted fracture the distal radius which enters the  articular surface. The radiocarpal joint is intact. No evidence of  elbow fracture or dislocation.  IMPRESSION:  Comminuted fracture of the distal radius.    Assessment and Plan: Injury of left lower arm - Plan: DG Wrist Complete Left, DG Forearm Left  Injury of right lower arm - Plan: ibuprofen  (ADVIL,MOTRIN) tablet 400 mg, CANCELED: DG Wrist Complete Right, CANCELED: DG Forearm Right  Fracture of radius, distal, right, closed - Plan: HYDROcodone-acetaminophen (NORCO/VICODIN) 5-325 MG per tablet  Distal radius fracture. Placed in a sugar tong splint and sling. vicodin to use prn pain She has seen Dr. Amanda PeaGramig in the past- years ago.  Would be happy to see him again if possible but is also willing to see someone else. Schedule asap Signed Abbe AmsterdamJessica Copland, MD

## 2013-11-24 ENCOUNTER — Telehealth: Payer: Self-pay

## 2013-11-24 DIAGNOSIS — S59212D Salter-Harris Type I physeal fracture of lower end of radius, left arm, subsequent encounter for fracture with routine healing: Secondary | ICD-10-CM

## 2013-11-24 NOTE — Telephone Encounter (Signed)
Called [facility] per Dr. Cyndie Chimeopland's request to make an appt for the left distal radius fx on Hayley Wu. Made an appointment for 1:45 on 11/25/13.  Pt notified of appointment. Referrals fyi

## 2013-11-24 NOTE — Telephone Encounter (Signed)
Patient called to ask Ladona Ridgelaylor what her appointment time and date for ortho was, she received a voicemail from her. Dr. Patsy Lageropland needs to put referrals in epic. So this would have been worked on appropriately for the patient.   Cell phone not working.  Home: [phone]

## 2013-11-25 ENCOUNTER — Encounter (HOSPITAL_BASED_OUTPATIENT_CLINIC_OR_DEPARTMENT_OTHER)
Admission: RE | Admit: 2013-11-25 | Discharge: 2013-11-25 | Disposition: A | Discharge status: Home / Self Care | Payer: Self-pay | Source: Ambulatory Visit | Attending: Orthopedic Surgery | Admitting: Orthopedic Surgery

## 2013-11-25 ENCOUNTER — Other Ambulatory Visit: Payer: Self-pay | Admitting: Orthopedic Surgery

## 2013-11-25 ENCOUNTER — Encounter (HOSPITAL_BASED_OUTPATIENT_CLINIC_OR_DEPARTMENT_OTHER): Payer: Self-pay | Admitting: *Deleted

## 2013-11-25 ENCOUNTER — Other Ambulatory Visit: Payer: Self-pay

## 2013-11-25 DIAGNOSIS — Z01810 Encounter for preprocedural cardiovascular examination: Secondary | ICD-10-CM | POA: Insufficient documentation

## 2013-11-25 LAB — BASIC METABOLIC PANEL
BUN: 9 mg/dL (ref 6–23)
CHLORIDE: 101 meq/L (ref 96–112)
CO2: 24 meq/L (ref 19–32)
Calcium: 9.5 mg/dL (ref 8.4–10.5)
Creatinine, Ser: 0.61 mg/dL (ref 0.50–1.10)
GFR calc Af Amer: >90 mL/min (ref >90)
GFR calc non Af Amer: >90 mL/min (ref >90)
GLUCOSE: 100 mg/dL — AB (ref 70–99)
POTASSIUM: 4.1 meq/L (ref 3.7–5.3)
SODIUM: 141 meq/L (ref 137–147)

## 2013-11-25 NOTE — H&P (Signed)
Hayley Wu is an 63 y.o. female.   Chief Complaint: Left Wrist Pain  HPI: Patient presents with a chief complaint of patient slipped and fell 11/23/2013 and sustained a displaced distal radius fracture [facility] and [facility] obtained.  She is placed in a sugar tong splint and given a sling.  She reports that the pain in the wrist is constant moderate with throbbing and aching there.  Some associated swelling of the fingertips was gone we'll bit worse over the last 24 hours, but no numbness or tingling.  She has been icing the region as well.  Past Medical History  Diagnosis Date  . Allergy   . Hypertension   . Contact lens/glasses fitting     Past Surgical History  Procedure Laterality Date  . Abdominal hysterectomy    . Breast surgery  2010    rt lumpectomy-neg  . Tubal ligation      Family History  Problem Relation Age of Onset  . Cancer Mother   . Hypertension Father   . Heart attack Father   . Hypertension Sister   . Hypertension Brother   . Heart attack Brother   . Cancer Daughter     thyroid  . Diabetes Maternal Grandfather    Social History:  reports that she has been smoking.  She does not have any smokeless tobacco history on file. She reports that she does not drink alcohol or use illicit drugs.  Allergies:  Allergies  Allergen Reactions  . Erythromycin Diarrhea  . Amoxicillin Rash  . Prednisone Rash    No prescriptions prior to admission    No results found for this or any previous visit (from the past 48 hour(s)). Dg Forearm Left  11/23/2013   CLINICAL DATA:  Fall, pain  EXAM: LEFT FOREARM - 2 VIEW  COMPARISON:  None.  FINDINGS: There is a comminuted fracture the distal radius which enters the articular surface. The radiocarpal joint is intact. No evidence of elbow fracture or dislocation.  IMPRESSION: Comminuted fracture of the distal radius.   Electronically Signed   By: Genevive BiStewart  Edmunds M.D.   On: 11/23/2013 19:00   Dg Wrist Complete  Left  11/23/2013   CLINICAL DATA:  Fall.  Wrist pain and fracture.  EXAM: LEFT WRIST - COMPLETE 3+ VIEW  COMPARISON:  None.  FINDINGS: Reverse Barton fracture of the distal radius with anterior displacement of distal radial fracture fragments and the carpus. The fracture is comminuted with transverse component in the metaphysis and sagittal/oblique components extending into the distal articular surface of the radius.  Osteoarthritis of the first carpometacarpal articulation. Mild lucency centrally in the scaphoid probably represents a geode.  IMPRESSION: 1. Reverse Barton fracture.   Electronically Signed   By: Herbie BaltimoreWalt  Liebkemann M.D.   On: 11/23/2013 19:00    Review of Systems  Constitutional: Negative.   HENT: Negative.   Eyes: Negative.   Respiratory: Negative.   Cardiovascular:       HTN  Gastrointestinal: Negative.   Musculoskeletal: Positive for joint pain and myalgias.  Skin: Negative.   Neurological: Negative.   Endo/Heme/Allergies: Negative.   Psychiatric/Behavioral: Positive for depression. The patient is nervous/anxious.     Height 5' 3.5" (1.613 m), weight 86.183 kg (190 lb). Physical Exam  Constitutional: She is oriented to person, place, and time. She appears well-developed and well-nourished.  HENT:  Head: Normocephalic and atraumatic.  Eyes: Pupils are equal, round, and reactive to light.  Neck: Normal range of motion.  Neck supple.  Cardiovascular: Intact distal pulses.   Respiratory: Effort normal.  Musculoskeletal: She exhibits tenderness.  The patient is 1-2+ swelling to the fingertips was some bruising going out to the fingertips.  Normal sensation of the fingertips.  Her wrist is 2+ swollen and tender to palpation.  I did not go through a range of motion secondary to the fracture.  Her elbow is nontender.    Neurological: She is alert and oriented to person, place, and time.  Skin: Skin is warm and dry.  Psychiatric: She has a normal mood and affect. Her behavior is  normal. Judgment and thought content normal.    [facility] showing a displaced volar Barton's fracture with ulnar neutral on the AP.  Assessment/Plan Assess: Displaced volar Barton's fracture, left wrist and a right hand dominant woman.  Plan: Options were discussed at length.  We'll schedule her set up for open reduction internal fixation using a DVR plate, preferably sometime in the next day or 2.  Healing time will be a couple of months.  Today we'll put her in a Velfoam wrist forearm splint.  She has Norco for pain control.  The patient is aware of the risks and benefits of surgery  Colbey Wirtanen R 11/25/2013, 6:03 PM

## 2013-11-25 NOTE — Progress Notes (Signed)
Here for [id number]-bmet

## 2013-11-26 ENCOUNTER — Ambulatory Visit (HOSPITAL_BASED_OUTPATIENT_CLINIC_OR_DEPARTMENT_OTHER): Payer: Self-pay | Admitting: Anesthesiology

## 2013-11-26 ENCOUNTER — Encounter (HOSPITAL_BASED_OUTPATIENT_CLINIC_OR_DEPARTMENT_OTHER)
Admission: RE | Disposition: A | Discharge status: Home / Self Care | Payer: Self-pay | Source: Ambulatory Visit | Attending: Orthopedic Surgery

## 2013-11-26 ENCOUNTER — Encounter (HOSPITAL_BASED_OUTPATIENT_CLINIC_OR_DEPARTMENT_OTHER): Payer: Self-pay | Admitting: Anesthesiology

## 2013-11-26 ENCOUNTER — Ambulatory Visit (HOSPITAL_BASED_OUTPATIENT_CLINIC_OR_DEPARTMENT_OTHER)
Admission: RE | Admit: 2013-11-26 | Discharge: 2013-11-26 | Disposition: A | Discharge status: Home / Self Care | Payer: Self-pay | Source: Ambulatory Visit | Attending: Orthopedic Surgery | Admitting: Orthopedic Surgery

## 2013-11-26 DIAGNOSIS — S52562A Barton's fracture of left radius, initial encounter for closed fracture: Secondary | ICD-10-CM

## 2013-11-26 DIAGNOSIS — I10 Essential (primary) hypertension: Secondary | ICD-10-CM | POA: Insufficient documentation

## 2013-11-26 DIAGNOSIS — W010XXA Fall on same level from slipping, tripping and stumbling without subsequent striking against object, initial encounter: Secondary | ICD-10-CM | POA: Insufficient documentation

## 2013-11-26 DIAGNOSIS — Z87891 Personal history of nicotine dependence: Secondary | ICD-10-CM | POA: Insufficient documentation

## 2013-11-26 DIAGNOSIS — S52599A Other fractures of lower end of unspecified radius, initial encounter for closed fracture: Secondary | ICD-10-CM | POA: Insufficient documentation

## 2013-11-26 DIAGNOSIS — S52501A Unspecified fracture of the lower end of right radius, initial encounter for closed fracture: Secondary | ICD-10-CM

## 2013-11-26 HISTORY — DX: Encounter for fitting and adjustment of spectacles and contact lenses: Z46.0

## 2013-11-26 HISTORY — PX: OPEN REDUCTION INTERNAL FIXATION (ORIF) DISTAL RADIAL FRACTURE: SHX5989

## 2013-11-26 LAB — POCT HEMOGLOBIN-HEMACUE: Hemoglobin: 12.8 g/dL (ref 12.0–15.0)

## 2013-11-26 SURGERY — OPEN REDUCTION INTERNAL FIXATION (ORIF) DISTAL RADIUS FRACTURE
Anesthesia: General | Site: Wrist | Laterality: Left

## 2013-11-26 MED ORDER — BUPIVACAINE-EPINEPHRINE (PF) 0.5% -1:200000 IJ SOLN
INTRAMUSCULAR | Status: DC | PRN
  Administered 2013-11-26: 30 mL via PERINEURAL

## 2013-11-26 MED ORDER — MIDAZOLAM HCL 5 MG/5ML IJ SOLN
INTRAMUSCULAR | Status: DC | PRN
  Administered 2013-11-26: 2 mg via INTRAVENOUS

## 2013-11-26 MED ORDER — VANCOMYCIN HCL 1000 MG IV SOLR
1000.0000 mg | INTRAVENOUS | Status: DC | PRN
  Administered 2013-11-26: 1000 mg via INTRAVENOUS

## 2013-11-26 MED ORDER — LIDOCAINE HCL (CARDIAC) 20 MG/ML IV SOLN
INTRAVENOUS | Status: DC | PRN
  Administered 2013-11-26: 30 mg via INTRAVENOUS

## 2013-11-26 MED ORDER — ONDANSETRON HCL 4 MG/2ML IJ SOLN: 4.0000 mg | Freq: Four times a day (QID) | INTRAMUSCULAR | Status: DC | PRN

## 2013-11-26 MED ORDER — HYDROCODONE-ACETAMINOPHEN 5-325 MG PO TABS: 1.0000 | ORAL_TABLET | Freq: Three times a day (TID) | ORAL | Status: DC | PRN

## 2013-11-26 MED ORDER — METOCLOPRAMIDE HCL 5 MG PO TABS: 5.0000 mg | ORAL_TABLET | Freq: Three times a day (TID) | ORAL | Status: DC | PRN

## 2013-11-26 MED ORDER — OXYCODONE HCL 5 MG/5ML PO SOLN: 5.0000 mg | Freq: Once | ORAL | Status: DC | PRN

## 2013-11-26 MED ORDER — DEXTROSE-NACL 5-0.45 % IV SOLN: INTRAVENOUS | Status: DC

## 2013-11-26 MED ORDER — FENTANYL CITRATE 0.05 MG/ML IJ SOLN
INTRAMUSCULAR | Status: AC
  Filled 2013-11-26: qty 2

## 2013-11-26 MED ORDER — BUPIVACAINE HCL (PF) 0.5 % IJ SOLN
INTRAMUSCULAR | Status: AC
  Filled 2013-11-26: qty 30

## 2013-11-26 MED ORDER — ONDANSETRON HCL 4 MG/2ML IJ SOLN
INTRAMUSCULAR | Status: DC | PRN
  Administered 2013-11-26: 4 mg via INTRAVENOUS

## 2013-11-26 MED ORDER — ONDANSETRON HCL 4 MG PO TABS: 4.0000 mg | ORAL_TABLET | Freq: Four times a day (QID) | ORAL | Status: DC | PRN

## 2013-11-26 MED ORDER — CHLORHEXIDINE GLUCONATE 4 % EX LIQD: 60.0000 mL | Freq: Once | CUTANEOUS | Status: DC

## 2013-11-26 MED ORDER — MEPERIDINE HCL 25 MG/ML IJ SOLN: 6.2500 mg | INTRAMUSCULAR | Status: DC | PRN

## 2013-11-26 MED ORDER — OXYCODONE HCL 5 MG PO TABS: 5.0000 mg | ORAL_TABLET | Freq: Once | ORAL | Status: DC | PRN

## 2013-11-26 MED ORDER — ONDANSETRON HCL 4 MG/2ML IJ SOLN
4.0000 mg | Freq: Once | INTRAMUSCULAR | Status: DC | PRN
Start: 2013-11-26 — End: 2013-11-26

## 2013-11-26 MED ORDER — PROPOFOL 10 MG/ML IV BOLUS
INTRAVENOUS | Status: AC
  Filled 2013-11-26: qty 60

## 2013-11-26 MED ORDER — FENTANYL CITRATE 0.05 MG/ML IJ SOLN
INTRAMUSCULAR | Status: DC | PRN
  Administered 2013-11-26 (×2): 50 ug via INTRAVENOUS

## 2013-11-26 MED ORDER — FENTANYL CITRATE 0.05 MG/ML IJ SOLN
50.0000 ug | INTRAMUSCULAR | Status: DC | PRN
  Administered 2013-11-26: 50 ug via INTRAVENOUS

## 2013-11-26 MED ORDER — BUPIVACAINE HCL (PF) 0.25 % IJ SOLN
INTRAMUSCULAR | Status: AC
  Filled 2013-11-26: qty 30

## 2013-11-26 MED ORDER — MIDAZOLAM HCL 2 MG/2ML IJ SOLN
1.0000 mg | INTRAMUSCULAR | Status: DC | PRN
  Administered 2013-11-26: 2 mg via INTRAVENOUS

## 2013-11-26 MED ORDER — VANCOMYCIN HCL IN DEXTROSE 1-5 GM/200ML-% IV SOLN: 1000.0000 mg | INTRAVENOUS | Status: DC

## 2013-11-26 MED ORDER — METOCLOPRAMIDE HCL 5 MG/ML IJ SOLN: 5.0000 mg | Freq: Three times a day (TID) | INTRAMUSCULAR | Status: DC | PRN

## 2013-11-26 MED ORDER — PROPOFOL INFUSION 10 MG/ML OPTIME
INTRAVENOUS | Status: DC | PRN
  Administered 2013-11-26: 100 ug/kg/min via INTRAVENOUS

## 2013-11-26 MED ORDER — MIDAZOLAM HCL 2 MG/2ML IJ SOLN
INTRAMUSCULAR | Status: AC
  Filled 2013-11-26: qty 2

## 2013-11-26 MED ORDER — FENTANYL CITRATE 0.05 MG/ML IJ SOLN
INTRAMUSCULAR | Status: AC
  Filled 2013-11-26: qty 6

## 2013-11-26 MED ORDER — LACTATED RINGERS IV SOLN
INTRAVENOUS | Status: DC
  Administered 2013-11-26 (×2): via INTRAVENOUS

## 2013-11-26 MED ORDER — HYDROMORPHONE HCL PF 1 MG/ML IJ SOLN: 0.2500 mg | INTRAMUSCULAR | Status: DC | PRN

## 2013-11-26 MED ORDER — VANCOMYCIN HCL IN DEXTROSE 1-5 GM/200ML-% IV SOLN
INTRAVENOUS | Status: AC
  Filled 2013-11-26: qty 200

## 2013-11-26 SURGICAL SUPPLY — 72 items
BANDAGE ELASTIC 3 VELCRO ST LF (GAUZE/BANDAGES/DRESSINGS) ×3 IMPLANT
BANDAGE ELASTIC 4 VELCRO ST LF (GAUZE/BANDAGES/DRESSINGS) IMPLANT
BIT DRILL 2 FAST STEP (BIT) ×3 IMPLANT
BIT DRILL 2.5X4 QC (BIT) ×3 IMPLANT
BLADE SURG 15 STRL LF DISP TIS (BLADE) ×2 IMPLANT
BLADE SURG 15 STRL SS (BLADE) ×4
BNDG COHESIVE 3X5 TAN STRL LF (GAUZE/BANDAGES/DRESSINGS) ×3 IMPLANT
BNDG ESMARK 4X9 LF (GAUZE/BANDAGES/DRESSINGS) ×3 IMPLANT
CANISTER SUCT 1200ML W/VALVE (MISCELLANEOUS) IMPLANT
COVER TABLE BACK 60X90 (DRAPES) ×3 IMPLANT
DECANTER SPIKE VIAL GLASS SM (MISCELLANEOUS) IMPLANT
DRAPE EXTREMITY T 121X128X90 (DRAPE) ×3 IMPLANT
DRAPE OEC MINIVIEW 54X84 (DRAPES) IMPLANT
DRAPE SURG 17X23 STRL (DRAPES) ×6 IMPLANT
DRAPE U 20/CS (DRAPES) IMPLANT
DURAPREP 26ML APPLICATOR (WOUND CARE) ×3 IMPLANT
ELECT REM PT RETURN 9FT ADLT (ELECTROSURGICAL) ×3
ELECTRODE REM PT RTRN 9FT ADLT (ELECTROSURGICAL) ×1 IMPLANT
GAUZE SPONGE 4X4 12PLY STRL (GAUZE/BANDAGES/DRESSINGS) ×3 IMPLANT
GAUZE XEROFORM 1X8 LF (GAUZE/BANDAGES/DRESSINGS) ×3 IMPLANT
GLOVE BIO SURGEON STRL SZ7.5 (GLOVE) IMPLANT
GLOVE BIO SURGEON STRL SZ8.5 (GLOVE) IMPLANT
GLOVE BIOGEL PI IND STRL 7.0 (GLOVE) ×1 IMPLANT
GLOVE BIOGEL PI IND STRL 8 (GLOVE) ×1 IMPLANT
GLOVE BIOGEL PI IND STRL 9 (GLOVE) ×1 IMPLANT
GLOVE BIOGEL PI INDICATOR 7.0 (GLOVE) ×2
GLOVE BIOGEL PI INDICATOR 8 (GLOVE) ×2
GLOVE BIOGEL PI INDICATOR 9 (GLOVE) ×2
GLOVE SURG SS PI 6.5 STRL IVOR (GLOVE) ×3 IMPLANT
GLOVE SURG SS PI 7.5 STRL IVOR (GLOVE) ×3 IMPLANT
GLOVE SURG SS PI 8.0 STRL IVOR (GLOVE) ×3 IMPLANT
GOWN STRL REUS W/ TWL LRG LVL3 (GOWN DISPOSABLE) ×2 IMPLANT
GOWN STRL REUS W/ TWL XL LVL3 (GOWN DISPOSABLE) ×1 IMPLANT
GOWN STRL REUS W/TWL LRG LVL3 (GOWN DISPOSABLE) ×4
GOWN STRL REUS W/TWL XL LVL3 (GOWN DISPOSABLE) ×2
NEEDLE HYPO 25X1 1.5 SAFETY (NEEDLE) IMPLANT
NS IRRIG 1000ML POUR BTL (IV SOLUTION) ×3 IMPLANT
PACK BASIN DAY SURGERY FS (CUSTOM PROCEDURE TRAY) ×3 IMPLANT
PAD CAST 3X4 CTTN HI CHSV (CAST SUPPLIES) ×1 IMPLANT
PAD CAST 4YDX4 CTTN HI CHSV (CAST SUPPLIES) IMPLANT
PADDING CAST ABS 4INX4YD NS (CAST SUPPLIES) ×2
PADDING CAST ABS COTTON 4X4 ST (CAST SUPPLIES) ×1 IMPLANT
PADDING CAST COTTON 3X4 STRL (CAST SUPPLIES) ×2
PADDING CAST COTTON 4X4 STRL (CAST SUPPLIES)
PEG SUBCHONDRAL SMOOTH 2.0X18 (Peg) ×6 IMPLANT
PEG SUBCHONDRAL SMOOTH 2.0X20 (Peg) ×6 IMPLANT
PENCIL BUTTON HOLSTER BLD 10FT (ELECTRODE) ×3 IMPLANT
PLATE STAN 24.4X59.5 LT (Plate) ×3 IMPLANT
SCREW BN 12X3.5XNS CORT TI (Screw) ×2 IMPLANT
SCREW BN 13X3.5XNS CORT TI (Screw) ×1 IMPLANT
SCREW CORT 3.5X12 (Screw) ×4 IMPLANT
SCREW CORT 3.5X13 (Screw) ×2 IMPLANT
SCREW CORT 3.5X15 (Screw) ×3 IMPLANT
SCREW CORT 3.5X16 LNG (Screw) ×3 IMPLANT
SHEET MEDIUM DRAPE 40X70 STRL (DRAPES) ×3 IMPLANT
SLEEVE SCD COMPRESS KNEE MED (MISCELLANEOUS) IMPLANT
SPLINT PLASTER CAST XFAST 3X15 (CAST SUPPLIES) IMPLANT
SPLINT PLASTER XTRA FASTSET 3X (CAST SUPPLIES)
SPONGE LAP 4X18 X RAY DECT (DISPOSABLE) IMPLANT
STOCKINETTE 4X48 STRL (DRAPES) IMPLANT
SUCTION FRAZIER TIP 10 FR DISP (SUCTIONS) IMPLANT
SUT ETHILON 3 0 PS 1 (SUTURE) IMPLANT
SUT VIC AB 2-0 SH 27 (SUTURE) ×2
SUT VIC AB 2-0 SH 27XBRD (SUTURE) ×1 IMPLANT
SUT VIC AB 3-0 SH 27 (SUTURE) ×2
SUT VIC AB 3-0 SH 27X BRD (SUTURE) ×1 IMPLANT
SYR BULB 3OZ (MISCELLANEOUS) ×3 IMPLANT
SYR CONTROL 10ML LL (SYRINGE) IMPLANT
TOWEL OR 17X24 6PK STRL BLUE (TOWEL DISPOSABLE) ×6 IMPLANT
TUBE CONNECTING 20'X1/4 (TUBING)
TUBE CONNECTING 20X1/4 (TUBING) IMPLANT
UNDERPAD 30X30 INCONTINENT (UNDERPADS AND DIAPERS) ×3 IMPLANT

## 2013-11-26 NOTE — Anesthesia Procedure Notes (Addendum)
Anesthesia Regional Block:  Supraclavicular block  Pre-Anesthetic Checklist: ,, timeout performed, Correct Patient, Correct Site, Correct Laterality, Correct Procedure, Correct Position, site marked, Risks and benefits discussed,  Surgical consent,  Pre-op evaluation,  At surgeon's request and post-op pain management  Laterality: Left  Prep: chloraprep       Needles:   Needle Type: Echogenic Stimulator Needle     Needle Length: 10cm 10 cm Needle Gauge: 21 and 21 G    Additional Needles:  Procedures: ultrasound guided (picture in chart) and nerve stimulator Supraclavicular block  Nerve Stimulator or Paresthesia:  Response: 0.4 mA,   Additional Responses:   Narrative:  Start time: 11/26/2013 8:15 AM End time: 11/26/2013 8:25 AM Injection made incrementally with aspirations every 5 mL.  Performed by: Personally  Anesthesiologist: Arta BruceKevin Ossey MD  Additional Notes: Monitors applied. Patient sedated. Sterile prep and drape,hand hygiene and sterile gloves were used. Relevant anatomy identified.Needle position confirmed.Local anesthetic injected incrementally after negative aspiration. Local anesthetic spread visualized around nerve(s). Vascular puncture avoided. No complications. Image printed for medical record.The patient tolerated the procedure well.        Performed by: [place] DesanctisLINKA, MARGARET L    Procedure Name: MAC Date/Time: 11/26/2013 9:09 AM Performed by:  DesanctisLINKA, MARGARET L Pre-anesthesia Checklist: Patient identified, Emergency Drugs available, Suction available, Patient being monitored and Timeout performed Patient Re-evaluated:Patient Re-evaluated prior to inductionOxygen Delivery Method: Circle system utilized and Simple face mask Comments: Spontaneous respirations maintained throughout.

## 2013-11-26 NOTE — Interval H&P Note (Signed)
History and Physical Interval Note:  11/26/2013 8:48 AM  Hayley Wu  has presented today for surgery, with the diagnosis of LEFT DISTAL RADIUS FRACTURE   The various methods of treatment have been discussed with the patient and family. After consideration of risks, benefits and other options for treatment, the patient has consented to  Procedure(s): OPEN REDUCTION INTERNAL FIXATION (ORIF) LEFT  DISTAL RADIUS  (Left) as a surgical intervention .  The patient's history has been reviewed, patient examined, no change in status, stable for surgery.  I have reviewed the patient's chart and labs.  Questions were answered to the patient's satisfaction.     Nestor LewandowskyOWAN,FRANK J

## 2013-11-26 NOTE — Anesthesia Preprocedure Evaluation (Signed)
Anesthesia Evaluation  Patient identified by MRN, date of birth, ID band Patient awake    Reviewed: Allergy & Precautions, H&P , NPO status , Patient's Chart, lab work & pertinent test results  Airway Mallampati: I TM Distance: >3 FB Neck ROM: Full    Dental   Pulmonary Current Smoker,          Cardiovascular hypertension, Pt. on medications     Neuro/Psych    GI/Hepatic   Endo/Other    Renal/GU      Musculoskeletal   Abdominal   Peds  Hematology   Anesthesia Other Findings   Reproductive/Obstetrics                           Anesthesia Physical Anesthesia Plan  ASA: II  Anesthesia Plan: General   Post-op Pain Management:    Induction: Intravenous  Airway Management Planned: LMA  Additional Equipment:   Intra-op Plan:   Post-operative Plan: Extubation in OR  Informed Consent: I have reviewed the patients History and Physical, chart, labs and discussed the procedure including the risks, benefits and alternatives for the proposed anesthesia with the patient or authorized representative who has indicated his/her understanding and acceptance.     Plan Discussed with: CRNA and Surgeon  Anesthesia Plan Comments:         Anesthesia Quick Evaluation  

## 2013-11-26 NOTE — Op Note (Signed)
Pre Op Dx: Comminuted displaced left volar Barton's distal radius fracture, intra-articular  Post Op Dx: Same  Procedure: Open reduction internal fixation left distal radius fracture using a Biomet standard DVR plate  Surgeon: Nestor LewandowskyFrank J Rowan, MD  Assistant: Tomi LikensEric K. Gaylene BrooksPhillips PA-C  (present throughout entire procedure and necessary for timely completion of the procedure)  Anesthesia: Axillary block with MAC  EBL: Minimal  Fluids: 800 cc  Tourniquet Time: 44 min  Indications: 63 year old woman who slipped and fell 2 days ago sustaining a comminuted intra-articular displaced left distal radius volar Barton's fracture. Was seen at urgent care placed in a splint and evaluated in our office a few hours later and scheduled for urgent open reduction internal fixation to decrease pain increase function and give her a more congruent joint. The wrist joint was subluxed off about 50% volarly. The risks and benefits of surgery discussed at length and the patient is prepared for surgical intervention.  Procedure: Patient was identified by arm band and vancomycin preoperative IV antibiotics in the holding area at the day surgery Center. Axillary block was also placed in the holding area. She is taken to operating room 8 where the appropriate anesthetic monitors were attached and IV sedation administered. A tourniquet was applied to the left forearm proximally in the left upper extremity prepped and draped in usual sterile fashion from the fingertips to the tourniquet. A timeout procedure was performed. The limb was wrapped with an Esmarch bandage and the tourniquet inflated to 250 mm of mercury. We began the operation by making a longitudinal skin incision along the FCR tendon going from the wrist proximally for 8 cm. Small bleeders were identified and cauterized the tendon sheath over the FCR tendon was incised and FCR was retracted laterally. We then incised the dorsal sheath of the tendon taking us down to the  supinator over the fracture site the supinator was then detached from its radial border and reflected ulnarly exposing the fracture site itself. Using traction and manual pressure the fracture was reducible. We then selected a standard left lamina DVR plate and fixed it to the proximal radius through the glide hole. Using the mini C-arm we then took images in the AP and lateral plane confirming good reduction and adjusting the position of the plate longitudinally on the radius. The glide screw was then fixed and the distal row of fixation screws filled with pegs to support the subchondral bone of the distal radius. 3 more bicortical proximal screws were applied C-arm images were taken in the AP and lateral plane confirming good reduction of the fracture and the wrist is no longer subluxed and now out to length ulnar -2-3 mm which was appropriate. At this point a tourniquet was let down small bleeders identified and cauterized the wound irrigated out normal saline solution and closed in layers with 3-0 Vicryl subcutaneous suture and 3-0 Vicryl subcuticular suture followed by a dressing of Xerofoam 4 x 4 dressing sponges 2 inch web roll Ace wrap and a Velcro splint. The patient was then taken to the recovery without difficulty appear

## 2013-11-26 NOTE — Progress Notes (Signed)
Assisted Dr. Ossey with left, ultrasound guided, supraclavicular block. Side rails up, monitors on throughout procedure. See vital signs in flow sheet. Tolerated Procedure well. 

## 2013-11-26 NOTE — Anesthesia Postprocedure Evaluation (Signed)
Anesthesia Post Note  Patient: Hayley Wu  Procedure(s) Performed: Procedure(s) (LRB): OPEN REDUCTION INTERNAL FIXATION (ORIF) LEFT  DISTAL RADIUS  (Left)  Anesthesia type: general  Patient location: PACU  Post pain: Pain level controlled  Post assessment: Patient's Cardiovascular Status Stable  Last Vitals:  Filed Vitals:   11/26/13 1236  BP: 122/59  Pulse: 70  Temp: 36.6 C  Resp: 14    Post vital signs: Reviewed and stable  Level of consciousness: sedated  Complications: No apparent anesthesia complications

## 2013-11-26 NOTE — Transfer of Care (Signed)
Immediate Anesthesia Transfer of Care Note  Patient: Hayley Wu  Procedure(s) Performed: Procedure(s): OPEN REDUCTION INTERNAL FIXATION (ORIF) LEFT  DISTAL RADIUS  (Left)  Patient Location: PACU  Anesthesia Type:MAC  Level of Consciousness: awake, alert , oriented and patient cooperative  Airway & Oxygen Therapy: Patient Spontanous Breathing and Patient connected to face mask oxygen  Post-op Assessment: Report given to PACU RN and Post -op Vital signs reviewed and stable  Post vital signs: Reviewed and stable  Complications: No apparent anesthesia complications

## 2013-11-26 NOTE — Discharge Instructions (Addendum)
Radial Fracture You have a broken bone (fracture) of the forearm. This is the part of your arm between the elbow and your wrist. Your forearm is made up of two bones. These are the radius and ulna. Your fracture is in the radial shaft. This is the bone in your forearm located on the thumb side. A cast or splint is used to protect and keep your injured bone from moving. The cast or splint will be on generally for about 5 to 6 weeks, with individual variations. HOME CARE INSTRUCTIONS   Keep the injured part elevated while sitting or lying down. Keep the injury above the level of your heart (the center of the chest). This will decrease swelling and pain.  Apply ice to the injury for 15-20 minutes, 03-04 times per day while awake, for 2 days. Put the ice in a plastic bag and place a towel between the bag of ice and your cast or splint.  Move your fingers to avoid stiffness and minimize swelling.  If you have a plaster or fiberglass cast:  Do not try to scratch the skin under the cast using sharp or pointed objects.  Check the skin around the cast every day. You may put lotion on any red or sore areas.  Keep your cast dry and clean.  If you have a plaster splint:  Wear the splint as directed.  You may loosen the elastic around the splint if your fingers become numb, tingle, or turn cold or blue.  Do not put pressure on any part of your cast or splint. It may break. Rest your cast only on a pillow for the first 24 hours until it is fully hardened.  Your cast or splint can be protected during bathing with a plastic bag. Do not lower the cast or splint into water.  Only take over-the-counter or prescription medicines for pain, discomfort, or fever as directed by your caregiver. SEEK IMMEDIATE MEDICAL CARE IF:   Your cast gets damaged or breaks.  You have more severe pain or swelling than you did before getting the cast.  You have severe pain when stretching your fingers.  There is a bad  smell, new stains and/or pus-like (purulent) drainage coming from under the cast.  Your fingers or hand turn pale or blue and become cold or your loose feeling. Document Released: 11/07/2005 Document Revised: 08/19/2011 Document Reviewed: 02/03/2006 [facility] Patient Information 2015 [employer] [place]. This information is not intended to replace advice given to you by your health care provider. Make sure you discuss any questions you have with your health care provider.  Regional Anesthesia Blocks  1. Numbness or the inability to move the "blocked" extremity may last from 3-48 hours after placement. The length of time depends on the medication injected and your individual response to the medication. If the numbness is not going away after 48 hours, call your surgeon.  2. The extremity that is blocked will need to be protected until the numbness is gone and the  Strength has returned. Because you cannot feel it, you will need to take extra care to avoid injury. Because it may be weak, you may have difficulty moving it or using it. You may not know what position it is in without looking at it while the block is in effect.  3. For blocks in the legs and feet, returning to weight bearing and walking needs to be done carefully. You will need to wait until the numbness is entirely gone and the  strength has returned. You should be able to move your leg and foot normally before you try and bear weight or walk. You will need someone to be with you when you first try to ensure you do not fall and possibly risk injury.  4. Bruising and tenderness at the needle site are common side effects and will resolve in a few days.  5. Persistent numbness or new problems with movement should be communicated to the surgeon or the [facility] [phone])/ [facility] [phone]).  Post Anesthesia Home Care Instructions  Activity: Get plenty of rest for the remainder of the day. A responsible  adult should stay with you for 24 hours following the procedure.  For the next 24 hours, DO NOT: -Drive a car -Advertising copywriterperate machinery -Drink alcoholic beverages -Take any medication unless instructed by your physician -Make any legal decisions or sign important papers.  Meals: Start with liquid foods such as gelatin or soup. Progress to regular foods as tolerated. Avoid greasy, spicy, heavy foods. If nausea and/or vomiting occur, drink only clear liquids until the nausea and/or vomiting subsides. Call your physician if vomiting continues.  Special Instructions/Symptoms: Your throat may feel dry or sore from the anesthesia or the breathing tube placed in your throat during surgery. If this causes discomfort, gargle with warm salt water. The discomfort should disappear within 24 hours.

## 2013-11-29 ENCOUNTER — Encounter (HOSPITAL_BASED_OUTPATIENT_CLINIC_OR_DEPARTMENT_OTHER): Payer: Self-pay | Admitting: Orthopedic Surgery

## 2014-05-23 ENCOUNTER — Other Ambulatory Visit: Payer: Self-pay | Admitting: Internal Medicine

## 2014-05-23 DIAGNOSIS — E049 Nontoxic goiter, unspecified: Secondary | ICD-10-CM

## 2014-05-24 ENCOUNTER — Other Ambulatory Visit: Payer: Self-pay

## 2014-11-29 ENCOUNTER — Ambulatory Visit
Admission: RE | Admit: 2014-11-29 | Discharge: 2014-11-29 | Disposition: A | Discharge status: Home / Self Care | Payer: 59 | Source: Ambulatory Visit | Attending: Internal Medicine | Admitting: Internal Medicine

## 2014-11-29 DIAGNOSIS — E049 Nontoxic goiter, unspecified: Secondary | ICD-10-CM

## 2015-03-12 IMAGING — CR DG FOREARM 2V*L*
2 series · 2 of 2 positions shown · non-contrast
Comparison: None.

CLINICAL DATA: Fall, pain

EXAM:
LEFT FOREARM - 2 VIEW

[AP]
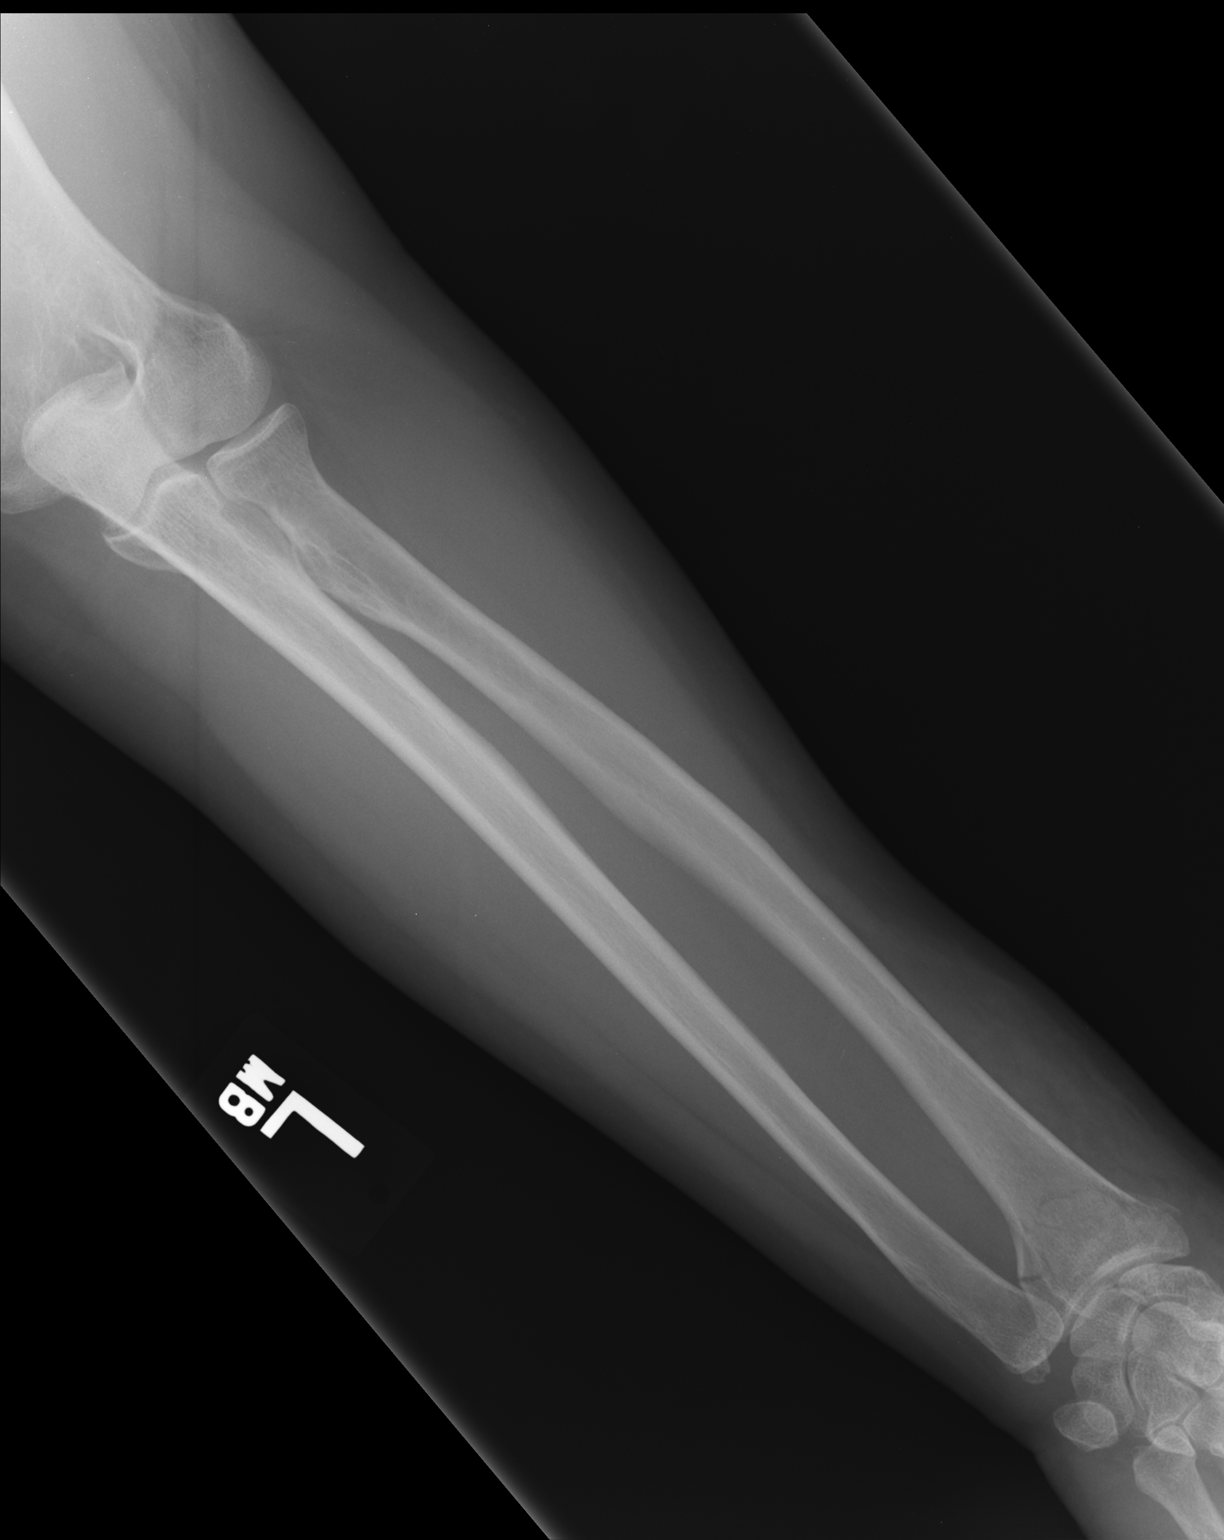

[lateral]
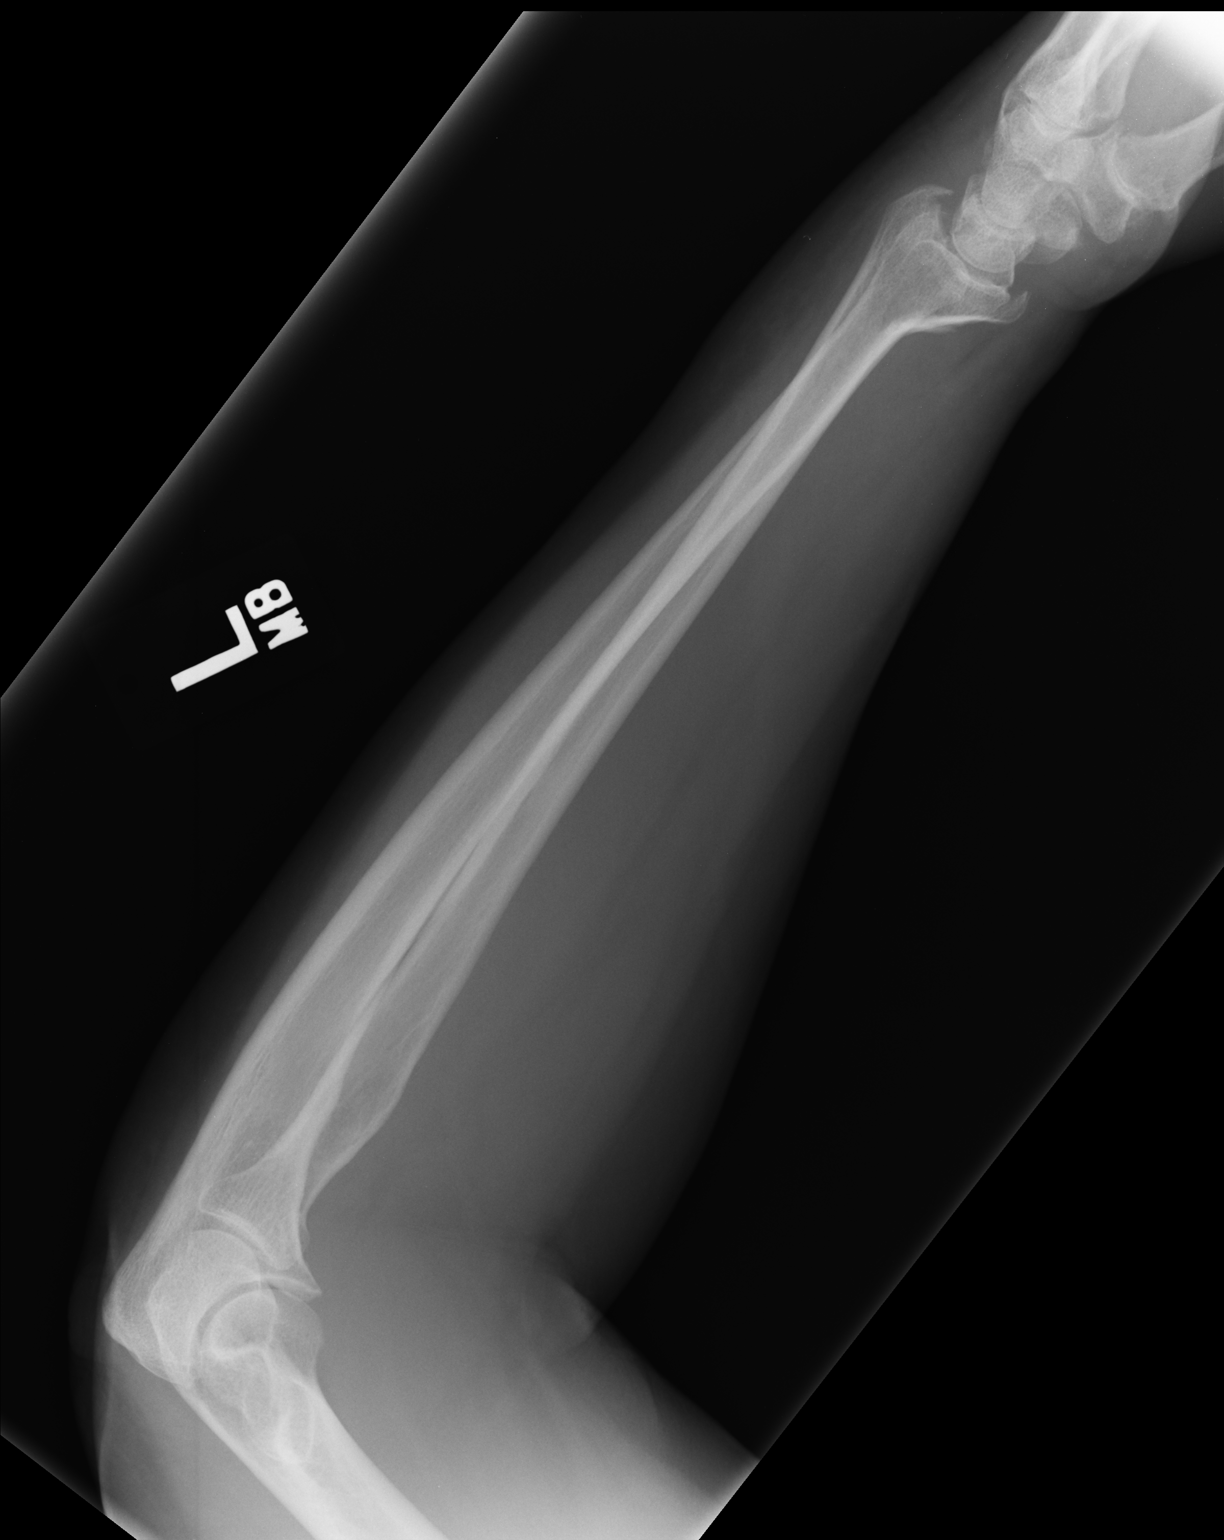

[2 of 2 positions shown; findings below may reference images not displayed]

FINDINGS: There is a comminuted fracture the distal radius which enters the
articular surface. The radiocarpal joint is intact. No evidence of
elbow fracture or dislocation.
IMPRESSION: Comminuted fracture of the distal radius.

## 2016-03-17 IMAGING — US US SOFT TISSUE HEAD/NECK
1 series · 13 of 25 positions shown · non-contrast
Comparison: 11/23/2012, 10/23/2011, 03/04/2011, 01/17/2010,
12/01/2009, 01/20/2008

Prior biopsy of 2 right-sided thyroid nodules 01/17/2010

CLINICAL DATA: 63-year-old female with a history of multinodular
goiter

EXAM:
THYROID ULTRASOUND
TECHNIQUE: Ultrasound examination of the thyroid gland and adjacent soft
tissues was performed.

[Series 1: us soft tissue head/neck · 0.08mm/px · 13 of 37 slices shown]
[im 1/37]
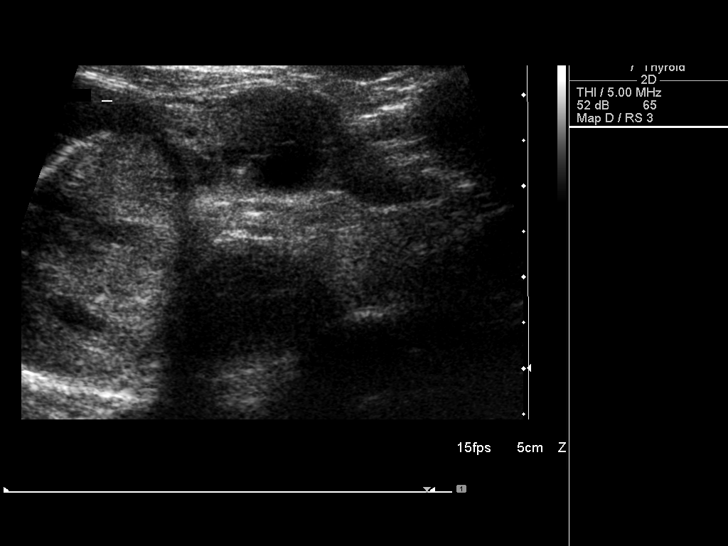
[im 4/37]
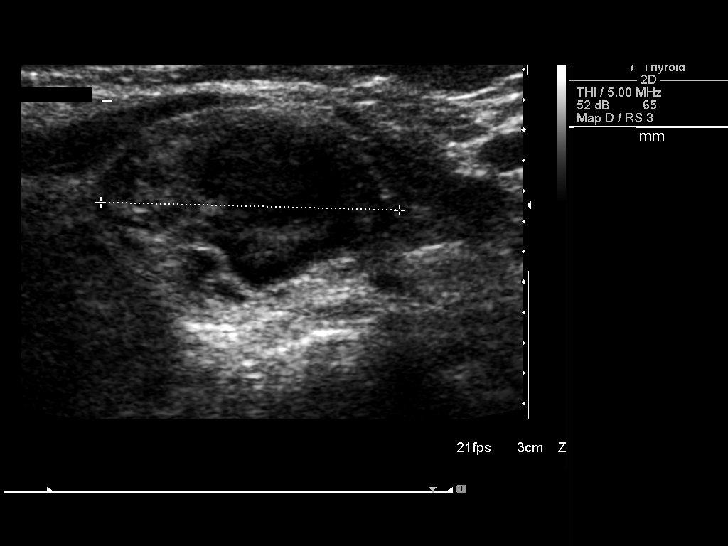
[im 7/37]
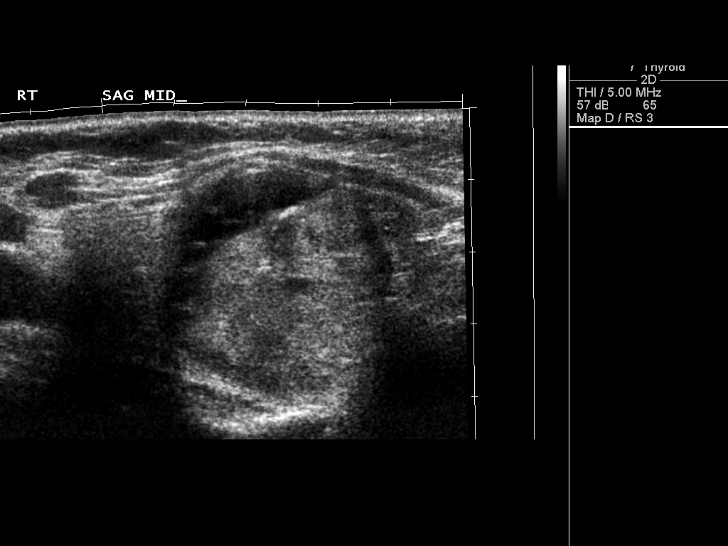
[im 10/37]
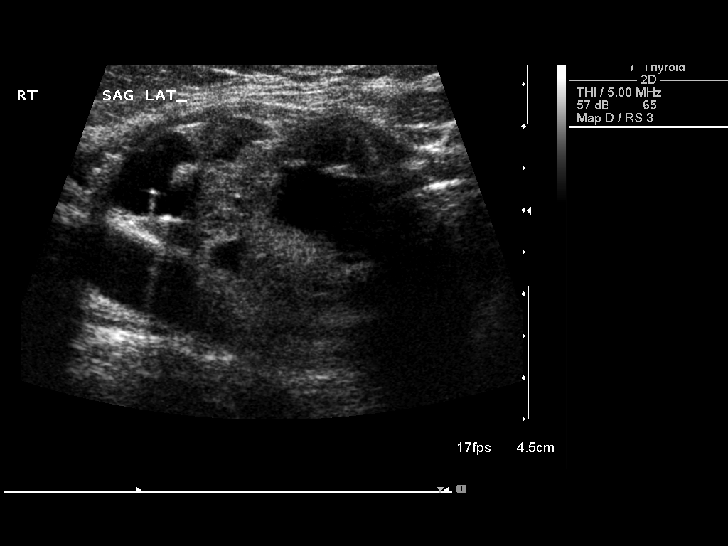
[im 13/37]
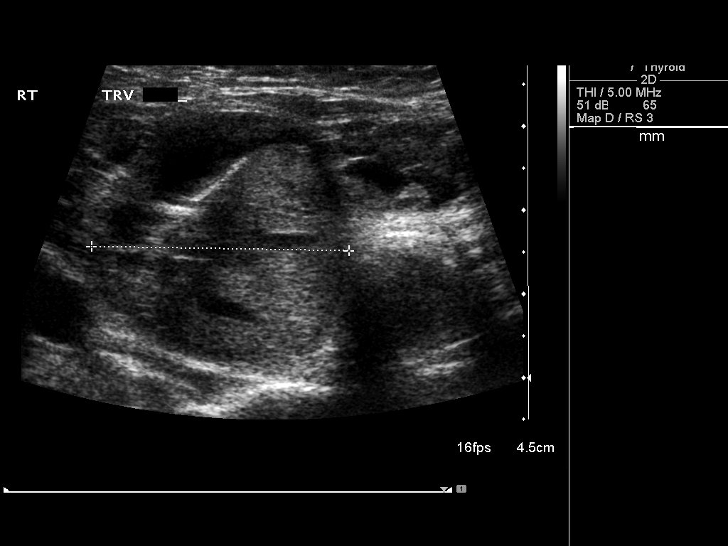
[im 16/37]
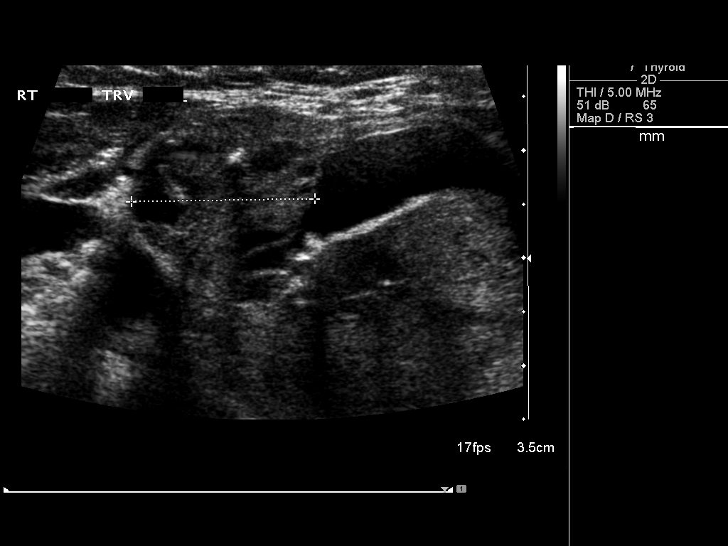
[im 19/37]
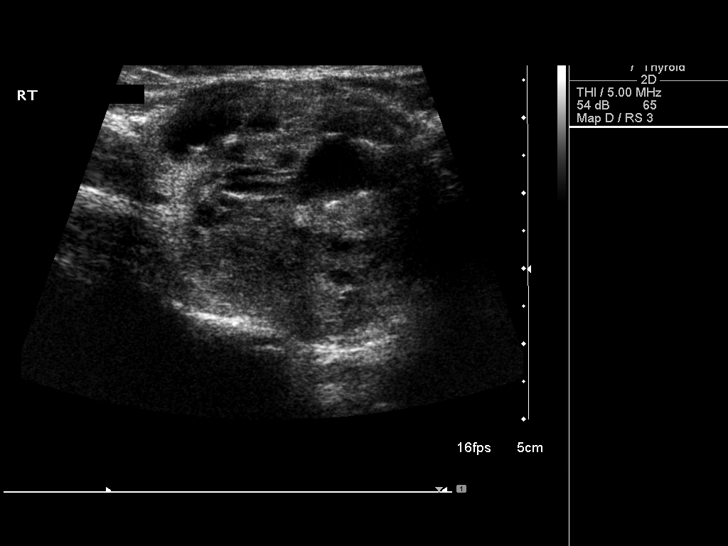
[im 22/37]
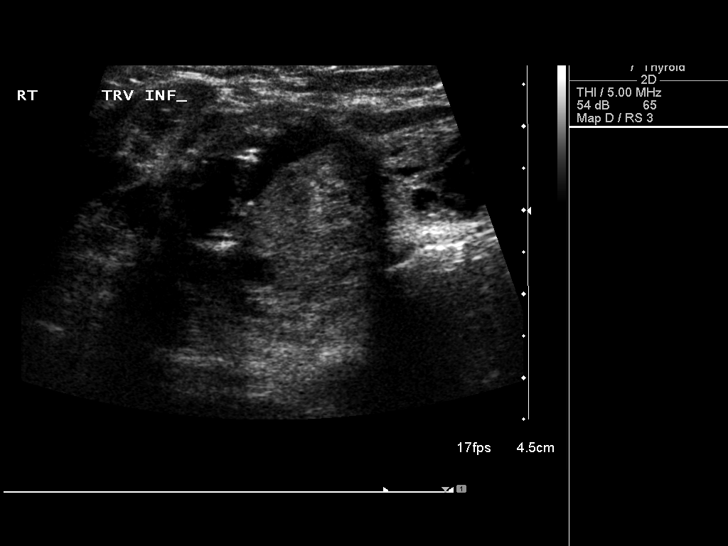
[im 25/37]
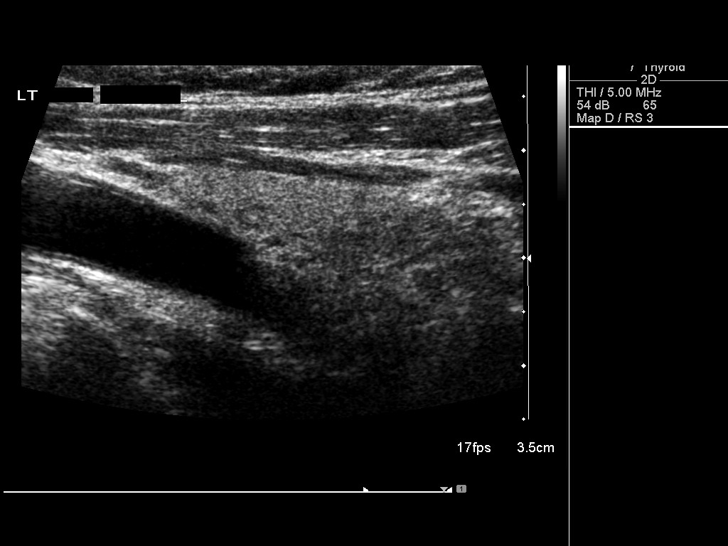
[im 28/37]
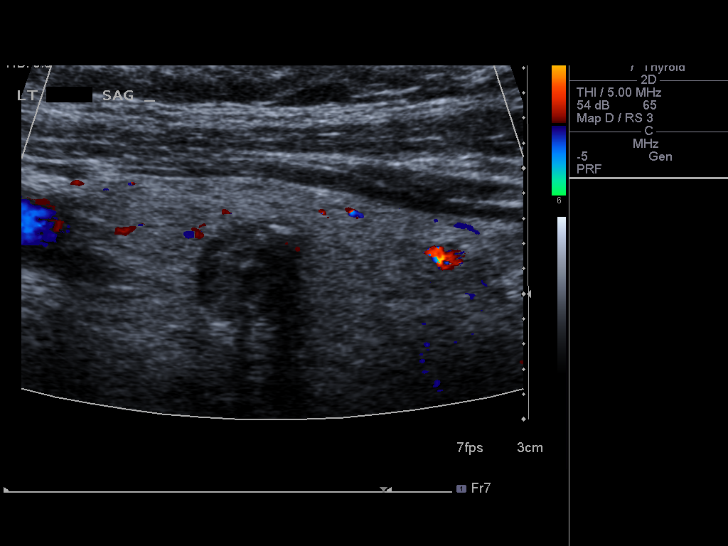
[im 31/37]
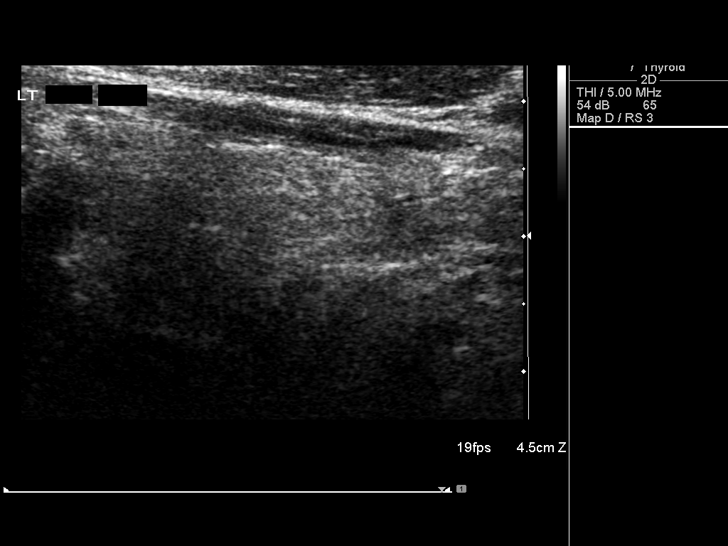
[im 34/37]
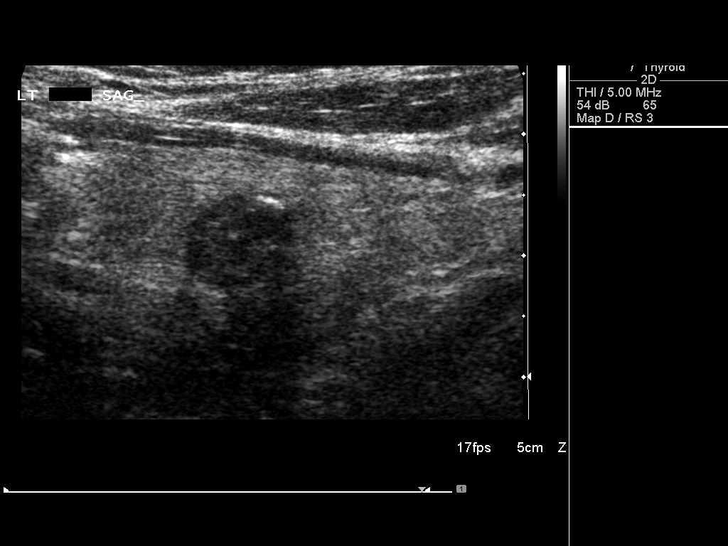
[im 37/37]
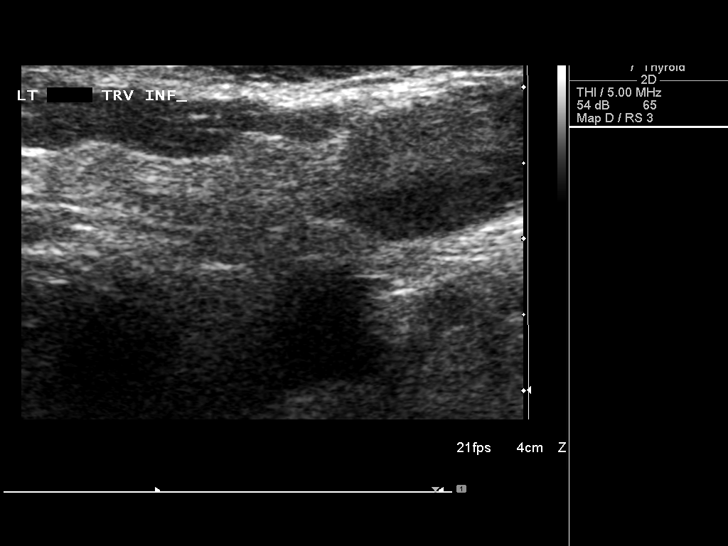

[13 of 25 positions shown; findings below may reference images not displayed]

FINDINGS: Right thyroid lobe

Measurements: 5.5 cm x 3.5 cm x 3.5 cm. Multiple nodules on the
right:

Superior: 2.6 cm x 1.3 cm x 1.7 cm. This nodule has solid features
previously biopsied.

Dominant nodule is inferior: 3.7 cm x 3.2 cm x 3.1 cm. Cystic and
solid changes with coarse calcifications or colloid. This has been
previously biopsied.

Medial sat level of isthmus:  2.2 cm x 1.3 cm x 2.0 cm

Left thyroid lobe

Measurements: 4.0 cm x 1.7 cm x 1.6 cm. Single left nodule measures
no greater than 9 mm. Coarse calcifications on the margin.

Isthmus

Thickness: 4 mm.  No nodules visualized.

Lymphadenopathy

None visualized.
IMPRESSION: Multinodular thyroid, with 2 right-sided nodules having previously
been biopsied. Recommend correlation with prior biopsy results.

Right-sided nodule at the isthmus also meets criteria for biopsy.
Ultrasound-guided fine needle aspiration should be considered, as
per the consensus statement: Management of Thyroid Nodules Detected
at US: Society of Radiologists in Ultrasound Consensus Conference

## 2016-10-11 ENCOUNTER — Other Ambulatory Visit: Payer: Self-pay | Admitting: Family Medicine

## 2016-10-11 DIAGNOSIS — E042 Nontoxic multinodular goiter: Secondary | ICD-10-CM

## 2017-06-09 DIAGNOSIS — N3000 Acute cystitis without hematuria: Secondary | ICD-10-CM | POA: Diagnosis not present

## 2017-06-09 DIAGNOSIS — N390 Urinary tract infection, site not specified: Secondary | ICD-10-CM | POA: Diagnosis not present

## 2017-10-10 DIAGNOSIS — E042 Nontoxic multinodular goiter: Secondary | ICD-10-CM | POA: Diagnosis not present

## 2017-10-10 DIAGNOSIS — I10 Essential (primary) hypertension: Secondary | ICD-10-CM | POA: Diagnosis not present

## 2017-10-10 DIAGNOSIS — E7800 Pure hypercholesterolemia, unspecified: Secondary | ICD-10-CM | POA: Diagnosis not present

## 2017-10-10 DIAGNOSIS — E559 Vitamin D deficiency, unspecified: Secondary | ICD-10-CM | POA: Diagnosis not present

## 2017-10-10 DIAGNOSIS — Z720 Tobacco use: Secondary | ICD-10-CM | POA: Diagnosis not present

## 2017-10-13 ENCOUNTER — Other Ambulatory Visit: Payer: Self-pay | Admitting: Family Medicine

## 2017-10-13 DIAGNOSIS — E042 Nontoxic multinodular goiter: Secondary | ICD-10-CM

## 2017-10-23 ENCOUNTER — Ambulatory Visit
Admission: RE | Admit: 2017-10-23 | Discharge: 2017-10-23 | Disposition: A | Discharge status: Home / Self Care | Payer: PPO | Source: Ambulatory Visit | Attending: Family Medicine | Admitting: Family Medicine

## 2017-10-23 DIAGNOSIS — E042 Nontoxic multinodular goiter: Secondary | ICD-10-CM | POA: Diagnosis not present

## 2017-11-06 DIAGNOSIS — E042 Nontoxic multinodular goiter: Secondary | ICD-10-CM | POA: Diagnosis not present

## 2017-11-06 DIAGNOSIS — Z808 Family history of malignant neoplasm of other organs or systems: Secondary | ICD-10-CM | POA: Diagnosis not present

## 2017-11-06 DIAGNOSIS — R7989 Other specified abnormal findings of blood chemistry: Secondary | ICD-10-CM | POA: Diagnosis not present

## 2017-11-11 DIAGNOSIS — R7989 Other specified abnormal findings of blood chemistry: Secondary | ICD-10-CM | POA: Diagnosis not present

## 2017-11-11 DIAGNOSIS — E042 Nontoxic multinodular goiter: Secondary | ICD-10-CM | POA: Diagnosis not present

## 2017-11-21 ENCOUNTER — Other Ambulatory Visit (HOSPITAL_COMMUNITY): Payer: Self-pay | Admitting: Internal Medicine

## 2017-11-21 DIAGNOSIS — E042 Nontoxic multinodular goiter: Secondary | ICD-10-CM

## 2017-11-21 DIAGNOSIS — E0590 Thyrotoxicosis, unspecified without thyrotoxic crisis or storm: Secondary | ICD-10-CM

## 2017-12-02 ENCOUNTER — Encounter (HOSPITAL_COMMUNITY): Payer: PPO

## 2017-12-02 ENCOUNTER — Encounter (HOSPITAL_COMMUNITY): Payer: PPO | Attending: Internal Medicine

## 2017-12-02 ENCOUNTER — Encounter (HOSPITAL_COMMUNITY): Payer: Self-pay

## 2017-12-03 ENCOUNTER — Encounter (HOSPITAL_COMMUNITY): Admission: RE | Admit: 2017-12-03 | Payer: PPO | Source: Ambulatory Visit

## 2018-02-23 ENCOUNTER — Encounter (HOSPITAL_COMMUNITY)
Admission: RE | Admit: 2018-02-23 | Discharge: 2018-02-23 | Disposition: A | Discharge status: Home / Self Care | Payer: PPO | Source: Ambulatory Visit | Attending: Internal Medicine | Admitting: Internal Medicine

## 2018-02-23 DIAGNOSIS — E0590 Thyrotoxicosis, unspecified without thyrotoxic crisis or storm: Secondary | ICD-10-CM

## 2018-02-23 DIAGNOSIS — E0520 Thyrotoxicosis with toxic multinodular goiter without thyrotoxic crisis or storm: Secondary | ICD-10-CM | POA: Diagnosis not present

## 2018-02-23 DIAGNOSIS — E042 Nontoxic multinodular goiter: Secondary | ICD-10-CM

## 2018-02-23 MED ORDER — SODIUM IODIDE I-123 7.4 MBQ CAPS
426.0000 | ORAL_CAPSULE | Freq: Once | ORAL | Status: AC
  Administered 2018-02-23: 426 via ORAL

## 2018-02-24 ENCOUNTER — Encounter (HOSPITAL_COMMUNITY)
Admission: RE | Admit: 2018-02-24 | Discharge: 2018-02-24 | Disposition: A | Discharge status: Home / Self Care | Payer: PPO | Source: Ambulatory Visit | Attending: Internal Medicine | Admitting: Internal Medicine

## 2018-02-24 DIAGNOSIS — E042 Nontoxic multinodular goiter: Secondary | ICD-10-CM | POA: Diagnosis not present

## 2018-03-10 DIAGNOSIS — E042 Nontoxic multinodular goiter: Secondary | ICD-10-CM | POA: Diagnosis not present

## 2018-03-10 DIAGNOSIS — E559 Vitamin D deficiency, unspecified: Secondary | ICD-10-CM | POA: Diagnosis not present

## 2018-03-17 ENCOUNTER — Other Ambulatory Visit (HOSPITAL_COMMUNITY): Payer: Self-pay | Admitting: Internal Medicine

## 2018-03-17 DIAGNOSIS — E042 Nontoxic multinodular goiter: Secondary | ICD-10-CM | POA: Diagnosis not present

## 2018-03-17 DIAGNOSIS — E0590 Thyrotoxicosis, unspecified without thyrotoxic crisis or storm: Secondary | ICD-10-CM | POA: Diagnosis not present

## 2018-03-17 DIAGNOSIS — Z720 Tobacco use: Secondary | ICD-10-CM | POA: Diagnosis not present

## 2018-03-17 DIAGNOSIS — Z808 Family history of malignant neoplasm of other organs or systems: Secondary | ICD-10-CM | POA: Diagnosis not present

## 2018-04-01 ENCOUNTER — Ambulatory Visit (HOSPITAL_COMMUNITY)
Admission: RE | Admit: 2018-04-01 | Discharge: 2018-04-01 | Disposition: A | Discharge status: Home / Self Care | Payer: PPO | Source: Ambulatory Visit | Attending: Internal Medicine | Admitting: Internal Medicine

## 2018-04-01 DIAGNOSIS — E0520 Thyrotoxicosis with toxic multinodular goiter without thyrotoxic crisis or storm: Secondary | ICD-10-CM | POA: Diagnosis not present

## 2018-04-01 DIAGNOSIS — E042 Nontoxic multinodular goiter: Secondary | ICD-10-CM | POA: Insufficient documentation

## 2018-04-01 DIAGNOSIS — E0590 Thyrotoxicosis, unspecified without thyrotoxic crisis or storm: Secondary | ICD-10-CM

## 2018-04-01 MED ORDER — SODIUM IODIDE I 131 CAPSULE
40.0000 | Freq: Once | INTRAVENOUS | Status: AC | PRN
  Administered 2018-04-01: 40 via ORAL

## 2018-04-03 ENCOUNTER — Ambulatory Visit (HOSPITAL_COMMUNITY): Payer: PPO

## 2018-04-28 DIAGNOSIS — E0590 Thyrotoxicosis, unspecified without thyrotoxic crisis or storm: Secondary | ICD-10-CM | POA: Diagnosis not present

## 2018-06-04 DIAGNOSIS — E042 Nontoxic multinodular goiter: Secondary | ICD-10-CM | POA: Diagnosis not present

## 2018-06-17 DIAGNOSIS — Z720 Tobacco use: Secondary | ICD-10-CM | POA: Diagnosis not present

## 2018-06-17 DIAGNOSIS — E042 Nontoxic multinodular goiter: Secondary | ICD-10-CM | POA: Diagnosis not present

## 2018-06-17 DIAGNOSIS — Z808 Family history of malignant neoplasm of other organs or systems: Secondary | ICD-10-CM | POA: Diagnosis not present

## 2018-06-21 DIAGNOSIS — M25571 Pain in right ankle and joints of right foot: Secondary | ICD-10-CM | POA: Diagnosis not present

## 2018-07-13 DIAGNOSIS — E042 Nontoxic multinodular goiter: Secondary | ICD-10-CM | POA: Diagnosis not present

## 2018-08-19 DIAGNOSIS — R7989 Other specified abnormal findings of blood chemistry: Secondary | ICD-10-CM | POA: Diagnosis not present

## 2018-10-15 DIAGNOSIS — E042 Nontoxic multinodular goiter: Secondary | ICD-10-CM | POA: Diagnosis not present

## 2018-12-15 DIAGNOSIS — E042 Nontoxic multinodular goiter: Secondary | ICD-10-CM | POA: Diagnosis not present

## 2018-12-17 DIAGNOSIS — E039 Hypothyroidism, unspecified: Secondary | ICD-10-CM | POA: Diagnosis not present

## 2018-12-17 DIAGNOSIS — Z808 Family history of malignant neoplasm of other organs or systems: Secondary | ICD-10-CM | POA: Diagnosis not present

## 2018-12-17 DIAGNOSIS — E042 Nontoxic multinodular goiter: Secondary | ICD-10-CM | POA: Diagnosis not present

## 2018-12-17 DIAGNOSIS — Z720 Tobacco use: Secondary | ICD-10-CM | POA: Diagnosis not present

## 2019-01-29 DIAGNOSIS — E039 Hypothyroidism, unspecified: Secondary | ICD-10-CM | POA: Diagnosis not present

## 2019-04-14 DIAGNOSIS — E039 Hypothyroidism, unspecified: Secondary | ICD-10-CM | POA: Diagnosis not present

## 2019-06-25 DIAGNOSIS — E559 Vitamin D deficiency, unspecified: Secondary | ICD-10-CM | POA: Diagnosis not present

## 2019-06-25 DIAGNOSIS — R739 Hyperglycemia, unspecified: Secondary | ICD-10-CM | POA: Diagnosis not present

## 2019-06-25 DIAGNOSIS — I10 Essential (primary) hypertension: Secondary | ICD-10-CM | POA: Diagnosis not present

## 2019-06-25 DIAGNOSIS — E7800 Pure hypercholesterolemia, unspecified: Secondary | ICD-10-CM | POA: Diagnosis not present

## 2019-12-16 DIAGNOSIS — E039 Hypothyroidism, unspecified: Secondary | ICD-10-CM | POA: Diagnosis not present

## 2019-12-17 DIAGNOSIS — Z720 Tobacco use: Secondary | ICD-10-CM | POA: Diagnosis not present

## 2019-12-17 DIAGNOSIS — E042 Nontoxic multinodular goiter: Secondary | ICD-10-CM | POA: Diagnosis not present

## 2019-12-17 DIAGNOSIS — Z808 Family history of malignant neoplasm of other organs or systems: Secondary | ICD-10-CM | POA: Diagnosis not present

## 2019-12-17 DIAGNOSIS — E039 Hypothyroidism, unspecified: Secondary | ICD-10-CM | POA: Diagnosis not present

## 2020-03-21 IMAGING — US US THYROID
1 series · 12 of 25 positions shown · non-contrast
Comparison: 11/29/2014 and previous back to 01/17/2010

CLINICAL DATA: Nontoxic multinodular goiter. Previous FNA biopsy of
2 right nodules 01/17/2010.

EXAM:
THYROID ULTRASOUND
TECHNIQUE: Ultrasound examination of the thyroid gland and adjacent soft
tissues was performed.

[Series 1: us thyroid · 0.05mm/px · 12 of 67 slices shown]
[im 3/67]
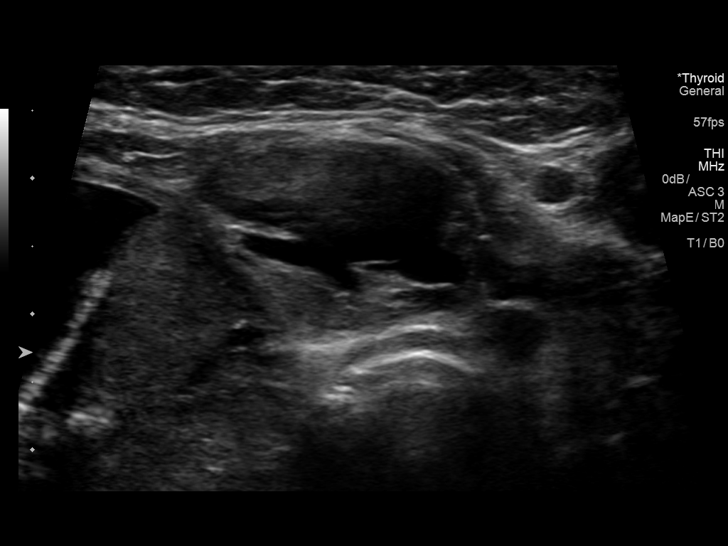
[im 9/67]
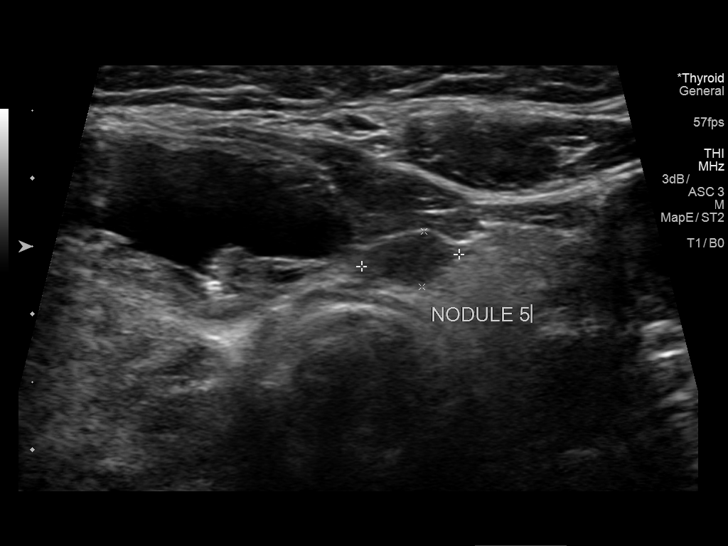
[im 14/67]
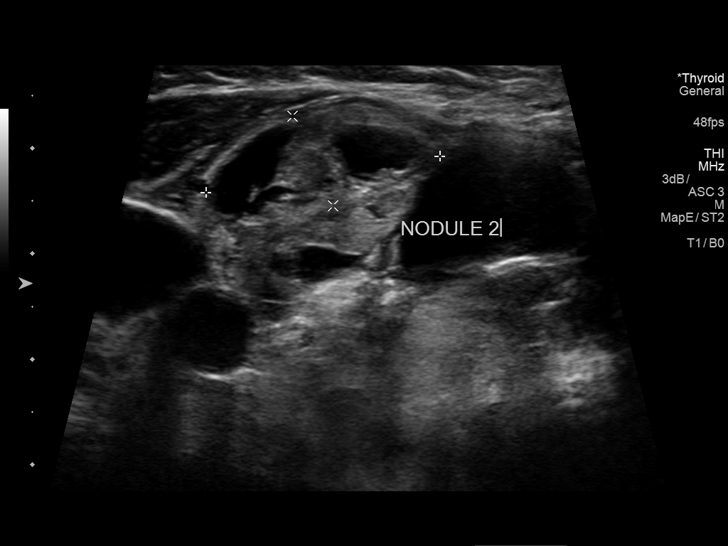
[im 20/67]
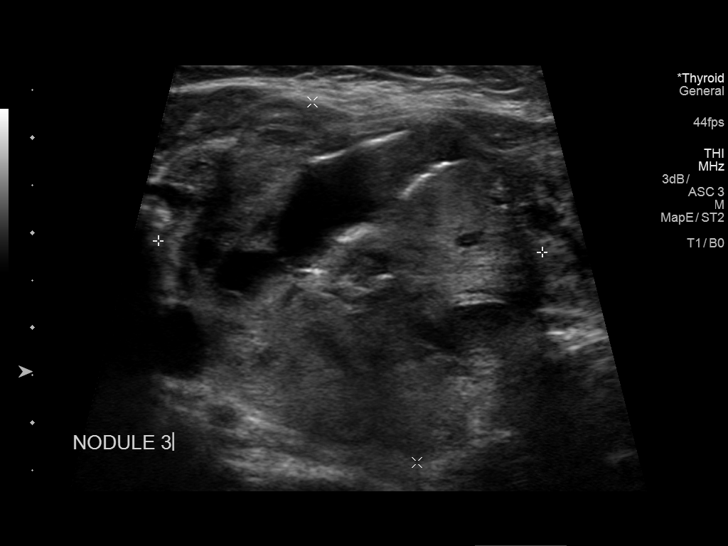
[im 25/67]
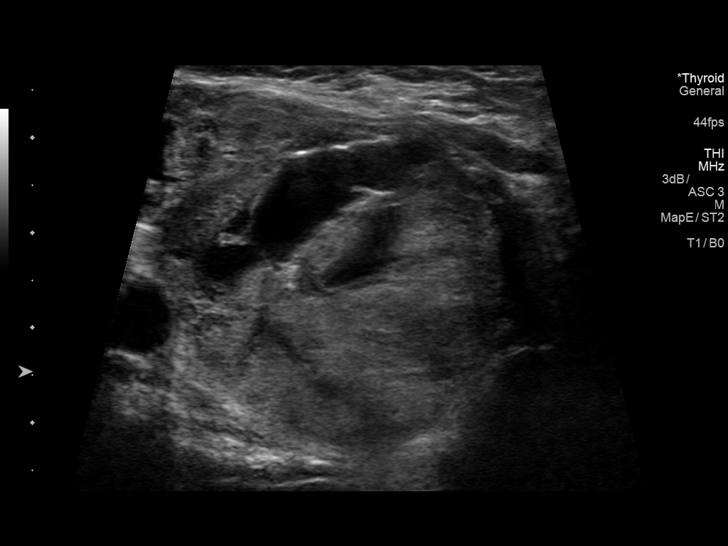
[im 31/67]
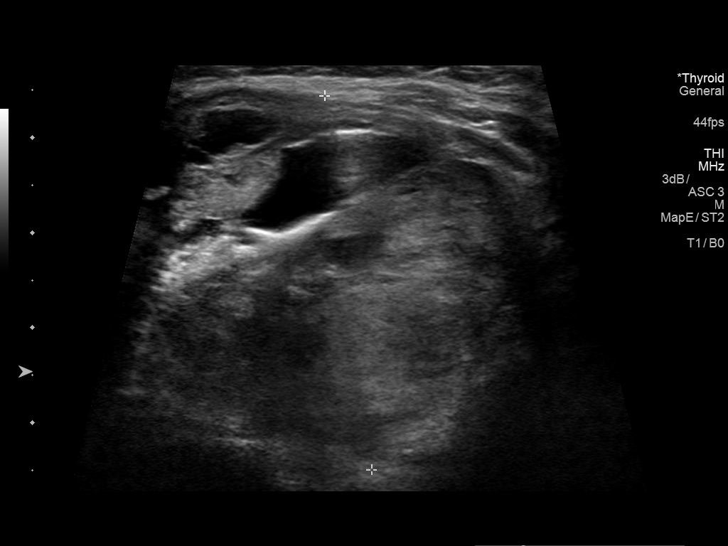
[im 36/67]
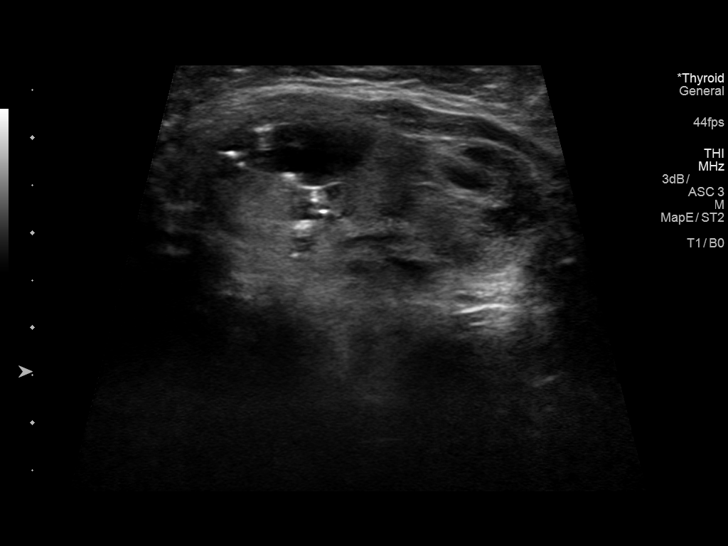
[im 42/67]
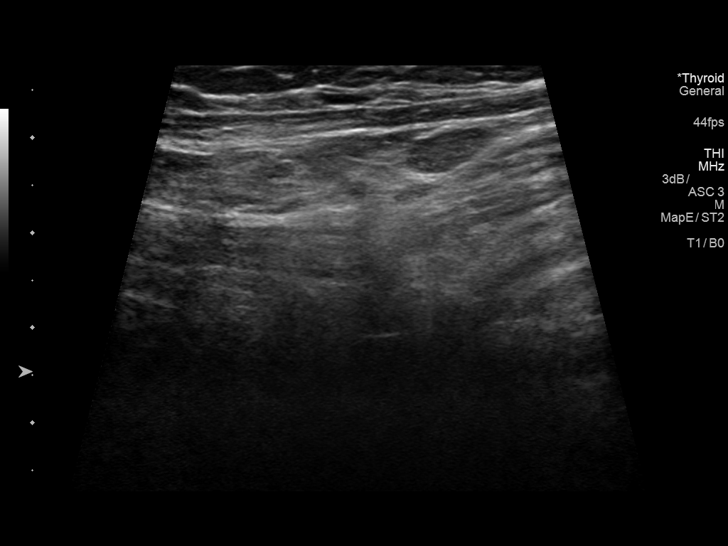
[im 47/67]
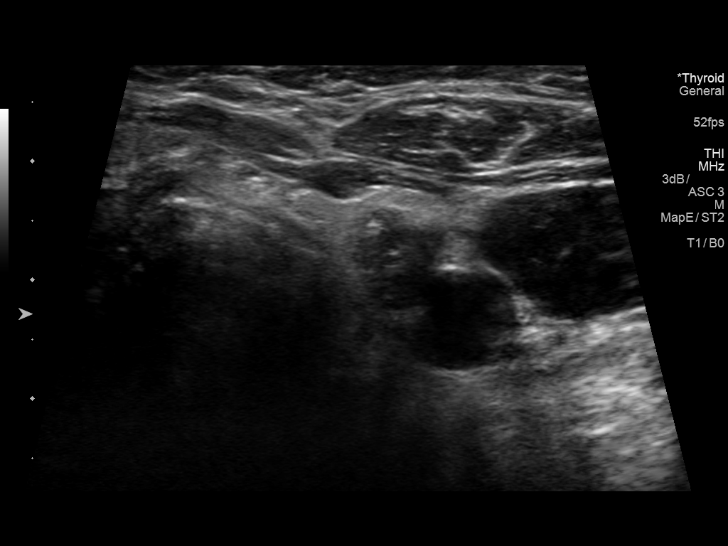
[im 53/67]
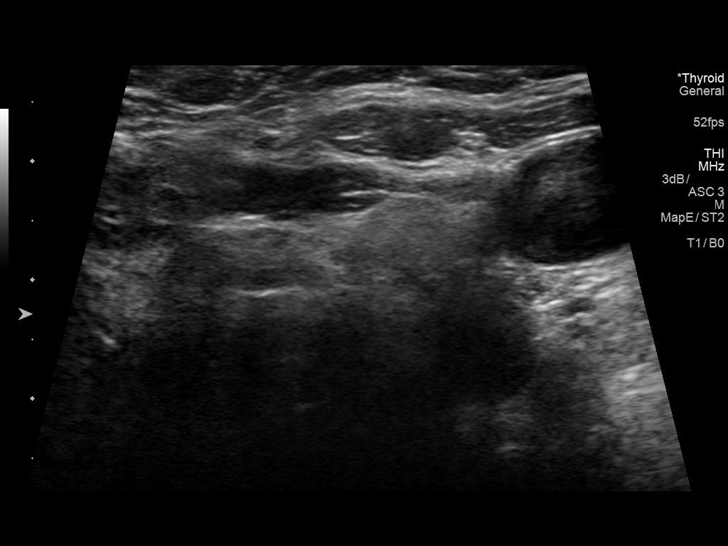
[im 58/67]
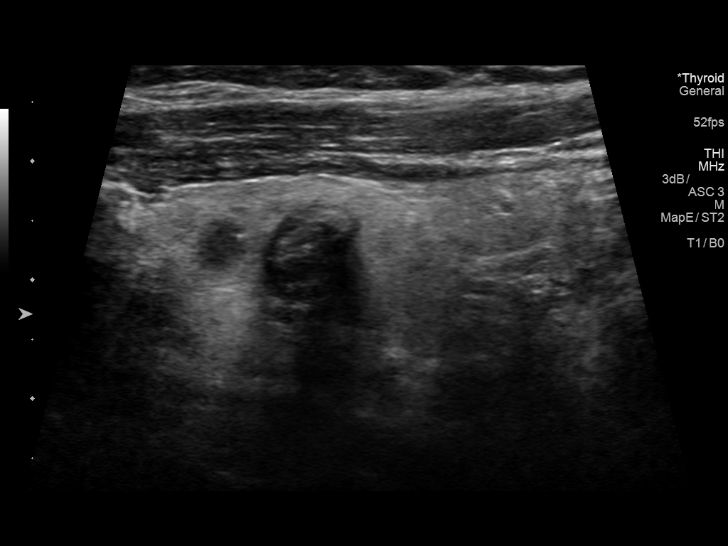
[im 64/67]
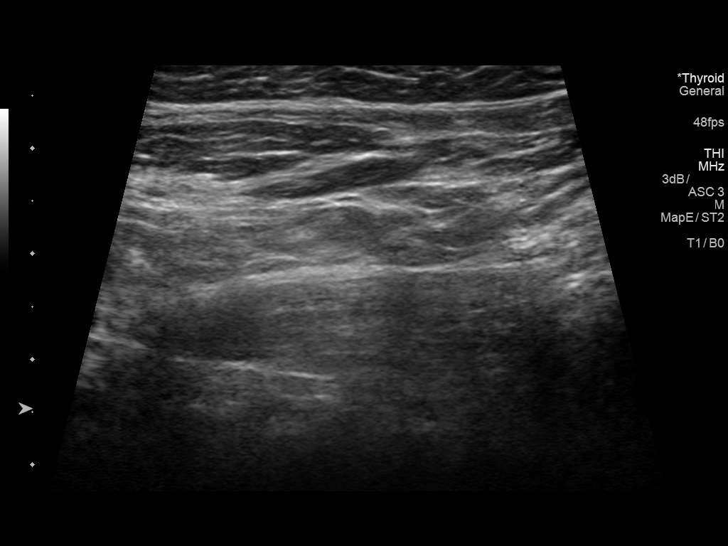

[12 of 25 positions shown; findings below may reference images not displayed]

FINDINGS: Parenchymal Echotexture: Markedly heterogenous

Isthmus: 1.4 cm thickness, previously

Right lobe: 6.7 x 4 x 3.8 cm, previously 5.5 x 3.5 x

Left lobe: 4.2 x 2 x 1.7, previously 4 x 1.7 x 1.6.

_________________________________________________________

Estimated total number of nodules >/= 1 cm: 3

Number of spongiform nodules >/=  2 cm not described below (TR1): 0

Number of mixed cystic and solid nodules >/= 1.5 cm not described
below (TR2): 0

_________________________________________________________

Nodule # 1:

Prior biopsy: No

Location: Isthmus; at right lobe

Maximum size: 2.8 cm; Other 2 dimensions: 1.4 x 2.2 cm, previously,
2.3 x 1.1 x 1.8 cm

Composition: mixed cystic and solid (1)

Echogenicity: hypoechoic (2)

Shape: not taller-than-wide (0)

Margins: ill-defined (0)

Echogenic foci: none (0)

ACR TI-RADS total points: 3.

ACR TI-RADS risk category:  TR3 (3 points).

Significant change in size (>/= 20% in two dimensions and minimal
increase of 2 mm): Yes

Change in features: No

Change in ACR TI-RADS risk category: No

ACR TI-RADS recommendations:

**Given size (>/= 2.5 cm) and appearance, fine needle aspiration of
this mildly suspicious nodule should be considered based on TI-RADS
criteria.

_________________________________________________________

Nodule # 2: 2.8 x 0.9 x 2.2 cm mixed solid/cystic, superior right,
previously 2.6 x 1.3 x 1.7; this has previously been biopsied

Nodule # 3: 4.7 x 4 x 4 cm mixed solid/cystic, mid right, previously
3.7 x 3.2 x 3.1; this has previously been biopsied

Nodule # 4:

Prior biopsy: No

Location: Left; Superior

Maximum size: 0.9 cm; Other 2 dimensions: 0.9 x 0.8 cm, previously,
1 x 0.9 x 0.8cm on 11/23/2012

Composition: solid/almost completely solid (2)

Echogenicity: hypoechoic (2)

Shape: taller-than-wide (3)

Margins: ill-defined (0)

Echogenic foci: macrocalcifications (1)

ACR TI-RADS total points: 8.

ACR TI-RADS risk category:  TR5 (>/= 7 points).

Significant change in size (>/= 20% in two dimensions and minimal
increase of 2 mm): No

Change in features: No

Change in ACR TI-RADS risk category: No

ACR TI-RADS recommendations:

Stability for greater than 5 years implies benignity. No biopsy or
follow-up recommended.

_________________________________________________________

Nodule # 5: 0.8 cm hypoechoic nodule without calcifications, isthmus
left of midline; no indication for biopsy or dedicated follow-up.
IMPRESSION: 1. Marked thyromegaly with bilateral nodules.
2. Recommend FNA biopsy of enlarging mildly suspicious 2.8 cm right
isthmic nodule.

The above is in keeping with the ACR TI-RADS recommendations - [HOSPITAL] 5432;[DATE].

## 2020-09-25 DIAGNOSIS — I10 Essential (primary) hypertension: Secondary | ICD-10-CM | POA: Diagnosis not present

## 2020-09-25 DIAGNOSIS — Z760 Encounter for issue of repeat prescription: Secondary | ICD-10-CM | POA: Diagnosis not present

## 2020-12-19 DIAGNOSIS — E039 Hypothyroidism, unspecified: Secondary | ICD-10-CM | POA: Diagnosis not present

## 2020-12-22 DIAGNOSIS — Z720 Tobacco use: Secondary | ICD-10-CM | POA: Diagnosis not present

## 2020-12-22 DIAGNOSIS — E039 Hypothyroidism, unspecified: Secondary | ICD-10-CM | POA: Diagnosis not present

## 2020-12-22 DIAGNOSIS — E042 Nontoxic multinodular goiter: Secondary | ICD-10-CM | POA: Diagnosis not present

## 2020-12-22 DIAGNOSIS — Z808 Family history of malignant neoplasm of other organs or systems: Secondary | ICD-10-CM | POA: Diagnosis not present

## 2021-01-04 DIAGNOSIS — L853 Xerosis cutis: Secondary | ICD-10-CM | POA: Diagnosis not present

## 2021-01-04 DIAGNOSIS — Z760 Encounter for issue of repeat prescription: Secondary | ICD-10-CM | POA: Diagnosis not present

## 2021-01-04 DIAGNOSIS — I10 Essential (primary) hypertension: Secondary | ICD-10-CM | POA: Diagnosis not present

## 2021-03-22 DIAGNOSIS — Z720 Tobacco use: Secondary | ICD-10-CM | POA: Diagnosis not present

## 2021-03-22 DIAGNOSIS — E039 Hypothyroidism, unspecified: Secondary | ICD-10-CM | POA: Diagnosis not present

## 2021-03-22 DIAGNOSIS — E7800 Pure hypercholesterolemia, unspecified: Secondary | ICD-10-CM | POA: Diagnosis not present

## 2021-03-22 DIAGNOSIS — I10 Essential (primary) hypertension: Secondary | ICD-10-CM | POA: Diagnosis not present

## 2021-04-23 DIAGNOSIS — E039 Hypothyroidism, unspecified: Secondary | ICD-10-CM | POA: Diagnosis not present

## 2021-04-23 DIAGNOSIS — E7800 Pure hypercholesterolemia, unspecified: Secondary | ICD-10-CM | POA: Diagnosis not present

## 2021-04-23 DIAGNOSIS — I10 Essential (primary) hypertension: Secondary | ICD-10-CM | POA: Diagnosis not present

## 2021-05-23 DIAGNOSIS — E7800 Pure hypercholesterolemia, unspecified: Secondary | ICD-10-CM | POA: Diagnosis not present

## 2021-05-23 DIAGNOSIS — I10 Essential (primary) hypertension: Secondary | ICD-10-CM | POA: Diagnosis not present

## 2021-07-09 DIAGNOSIS — I10 Essential (primary) hypertension: Secondary | ICD-10-CM | POA: Diagnosis not present

## 2021-07-09 DIAGNOSIS — H60502 Unspecified acute noninfective otitis externa, left ear: Secondary | ICD-10-CM | POA: Diagnosis not present

## 2021-08-22 DIAGNOSIS — I10 Essential (primary) hypertension: Secondary | ICD-10-CM | POA: Diagnosis not present

## 2021-12-24 DIAGNOSIS — E042 Nontoxic multinodular goiter: Secondary | ICD-10-CM | POA: Diagnosis not present

## 2021-12-24 DIAGNOSIS — E039 Hypothyroidism, unspecified: Secondary | ICD-10-CM | POA: Diagnosis not present

## 2021-12-24 DIAGNOSIS — Z808 Family history of malignant neoplasm of other organs or systems: Secondary | ICD-10-CM | POA: Diagnosis not present

## 2021-12-24 DIAGNOSIS — Z720 Tobacco use: Secondary | ICD-10-CM | POA: Diagnosis not present

## 2022-02-08 DIAGNOSIS — E039 Hypothyroidism, unspecified: Secondary | ICD-10-CM | POA: Diagnosis not present

## 2022-02-22 DIAGNOSIS — I10 Essential (primary) hypertension: Secondary | ICD-10-CM | POA: Diagnosis not present

## 2022-02-22 DIAGNOSIS — E7800 Pure hypercholesterolemia, unspecified: Secondary | ICD-10-CM | POA: Diagnosis not present

## 2022-03-26 DIAGNOSIS — E039 Hypothyroidism, unspecified: Secondary | ICD-10-CM | POA: Diagnosis not present

## 2022-04-04 DIAGNOSIS — M546 Pain in thoracic spine: Secondary | ICD-10-CM | POA: Insufficient documentation

## 2022-04-04 DIAGNOSIS — M545 Low back pain, unspecified: Secondary | ICD-10-CM | POA: Insufficient documentation

## 2022-05-01 DIAGNOSIS — M546 Pain in thoracic spine: Secondary | ICD-10-CM | POA: Diagnosis not present

## 2022-08-23 DIAGNOSIS — M5440 Lumbago with sciatica, unspecified side: Secondary | ICD-10-CM | POA: Diagnosis not present

## 2022-08-26 DIAGNOSIS — M47896 Other spondylosis, lumbar region: Secondary | ICD-10-CM | POA: Diagnosis not present

## 2022-08-26 DIAGNOSIS — M4316 Spondylolisthesis, lumbar region: Secondary | ICD-10-CM | POA: Diagnosis not present

## 2022-08-26 DIAGNOSIS — M546 Pain in thoracic spine: Secondary | ICD-10-CM | POA: Diagnosis not present

## 2022-08-26 DIAGNOSIS — M47816 Spondylosis without myelopathy or radiculopathy, lumbar region: Secondary | ICD-10-CM | POA: Insufficient documentation

## 2022-08-27 DIAGNOSIS — Z1239 Encounter for other screening for malignant neoplasm of breast: Secondary | ICD-10-CM | POA: Diagnosis not present

## 2022-08-27 DIAGNOSIS — M5442 Lumbago with sciatica, left side: Secondary | ICD-10-CM | POA: Diagnosis not present

## 2022-08-27 DIAGNOSIS — Z1211 Encounter for screening for malignant neoplasm of colon: Secondary | ICD-10-CM | POA: Diagnosis not present

## 2022-08-27 DIAGNOSIS — I10 Essential (primary) hypertension: Secondary | ICD-10-CM | POA: Diagnosis not present

## 2022-08-27 DIAGNOSIS — Z23 Encounter for immunization: Secondary | ICD-10-CM | POA: Diagnosis not present

## 2022-08-27 DIAGNOSIS — Z0000 Encounter for general adult medical examination without abnormal findings: Secondary | ICD-10-CM | POA: Diagnosis not present

## 2022-08-27 DIAGNOSIS — E039 Hypothyroidism, unspecified: Secondary | ICD-10-CM | POA: Diagnosis not present

## 2022-08-27 DIAGNOSIS — Z720 Tobacco use: Secondary | ICD-10-CM | POA: Diagnosis not present

## 2022-08-27 DIAGNOSIS — E7800 Pure hypercholesterolemia, unspecified: Secondary | ICD-10-CM | POA: Diagnosis not present

## 2022-09-09 DIAGNOSIS — M5451 Vertebrogenic low back pain: Secondary | ICD-10-CM | POA: Diagnosis not present

## 2022-09-20 ENCOUNTER — Emergency Department (HOSPITAL_COMMUNITY): Payer: PPO

## 2022-09-20 ENCOUNTER — Encounter (HOSPITAL_COMMUNITY): Payer: Self-pay

## 2022-09-20 ENCOUNTER — Other Ambulatory Visit: Payer: Self-pay

## 2022-09-20 ENCOUNTER — Inpatient Hospital Stay (HOSPITAL_COMMUNITY)
Admission: EM | Admit: 2022-09-20 | Discharge: 2022-09-27 | DRG: 519 | Disposition: A | Discharge status: Home Health | Payer: PPO | Attending: Internal Medicine | Admitting: Internal Medicine

## 2022-09-20 DIAGNOSIS — C7951 Secondary malignant neoplasm of bone: Secondary | ICD-10-CM | POA: Diagnosis present

## 2022-09-20 DIAGNOSIS — I10 Essential (primary) hypertension: Secondary | ICD-10-CM | POA: Diagnosis present

## 2022-09-20 DIAGNOSIS — G992 Myelopathy in diseases classified elsewhere: Secondary | ICD-10-CM | POA: Diagnosis not present

## 2022-09-20 DIAGNOSIS — K7689 Other specified diseases of liver: Secondary | ICD-10-CM | POA: Diagnosis not present

## 2022-09-20 DIAGNOSIS — G9520 Unspecified cord compression: Secondary | ICD-10-CM | POA: Diagnosis present

## 2022-09-20 DIAGNOSIS — Z720 Tobacco use: Secondary | ICD-10-CM | POA: Diagnosis not present

## 2022-09-20 DIAGNOSIS — M21371 Foot drop, right foot: Secondary | ICD-10-CM | POA: Diagnosis present

## 2022-09-20 DIAGNOSIS — Z888 Allergy status to other drugs, medicaments and biological substances status: Secondary | ICD-10-CM

## 2022-09-20 DIAGNOSIS — M8458XA Pathological fracture in neoplastic disease, other specified site, initial encounter for fracture: Secondary | ICD-10-CM | POA: Diagnosis not present

## 2022-09-20 DIAGNOSIS — M4854XA Collapsed vertebra, not elsewhere classified, thoracic region, initial encounter for fracture: Secondary | ICD-10-CM | POA: Diagnosis not present

## 2022-09-20 DIAGNOSIS — Z91040 Latex allergy status: Secondary | ICD-10-CM | POA: Diagnosis not present

## 2022-09-20 DIAGNOSIS — E876 Hypokalemia: Secondary | ICD-10-CM

## 2022-09-20 DIAGNOSIS — K5730 Diverticulosis of large intestine without perforation or abscess without bleeding: Secondary | ICD-10-CM | POA: Diagnosis not present

## 2022-09-20 DIAGNOSIS — C771 Secondary and unspecified malignant neoplasm of intrathoracic lymph nodes: Secondary | ICD-10-CM | POA: Diagnosis not present

## 2022-09-20 DIAGNOSIS — G9529 Other cord compression: Secondary | ICD-10-CM | POA: Diagnosis not present

## 2022-09-20 DIAGNOSIS — K8020 Calculus of gallbladder without cholecystitis without obstruction: Secondary | ICD-10-CM

## 2022-09-20 DIAGNOSIS — Z880 Allergy status to penicillin: Secondary | ICD-10-CM | POA: Diagnosis not present

## 2022-09-20 DIAGNOSIS — I6782 Cerebral ischemia: Secondary | ICD-10-CM | POA: Diagnosis not present

## 2022-09-20 DIAGNOSIS — M4804 Spinal stenosis, thoracic region: Secondary | ICD-10-CM | POA: Diagnosis not present

## 2022-09-20 DIAGNOSIS — Z79899 Other long term (current) drug therapy: Secondary | ICD-10-CM | POA: Diagnosis not present

## 2022-09-20 DIAGNOSIS — D751 Secondary polycythemia: Secondary | ICD-10-CM | POA: Diagnosis present

## 2022-09-20 DIAGNOSIS — R918 Other nonspecific abnormal finding of lung field: Secondary | ICD-10-CM

## 2022-09-20 DIAGNOSIS — R339 Retention of urine, unspecified: Secondary | ICD-10-CM | POA: Diagnosis not present

## 2022-09-20 DIAGNOSIS — Z0389 Encounter for observation for other suspected diseases and conditions ruled out: Secondary | ICD-10-CM | POA: Diagnosis not present

## 2022-09-20 DIAGNOSIS — Z833 Family history of diabetes mellitus: Secondary | ICD-10-CM

## 2022-09-20 DIAGNOSIS — E039 Hypothyroidism, unspecified: Secondary | ICD-10-CM | POA: Diagnosis not present

## 2022-09-20 DIAGNOSIS — T502X5A Adverse effect of carbonic-anhydrase inhibitors, benzothiadiazides and other diuretics, initial encounter: Secondary | ICD-10-CM | POA: Diagnosis not present

## 2022-09-20 DIAGNOSIS — F17210 Nicotine dependence, cigarettes, uncomplicated: Secondary | ICD-10-CM | POA: Diagnosis present

## 2022-09-20 DIAGNOSIS — F419 Anxiety disorder, unspecified: Secondary | ICD-10-CM | POA: Diagnosis present

## 2022-09-20 DIAGNOSIS — Z8249 Family history of ischemic heart disease and other diseases of the circulatory system: Secondary | ICD-10-CM | POA: Diagnosis not present

## 2022-09-20 DIAGNOSIS — D4989 Neoplasm of unspecified behavior of other specified sites: Secondary | ICD-10-CM | POA: Diagnosis not present

## 2022-09-20 DIAGNOSIS — D492 Neoplasm of unspecified behavior of bone, soft tissue, and skin: Secondary | ICD-10-CM | POA: Diagnosis present

## 2022-09-20 DIAGNOSIS — C3431 Malignant neoplasm of lower lobe, right bronchus or lung: Secondary | ICD-10-CM | POA: Diagnosis present

## 2022-09-20 DIAGNOSIS — Z90710 Acquired absence of both cervix and uterus: Secondary | ICD-10-CM

## 2022-09-20 DIAGNOSIS — R29898 Other symptoms and signs involving the musculoskeletal system: Secondary | ICD-10-CM

## 2022-09-20 DIAGNOSIS — G959 Disease of spinal cord, unspecified: Secondary | ICD-10-CM | POA: Diagnosis not present

## 2022-09-20 DIAGNOSIS — C3491 Malignant neoplasm of unspecified part of right bronchus or lung: Secondary | ICD-10-CM | POA: Diagnosis not present

## 2022-09-20 DIAGNOSIS — C787 Secondary malignant neoplasm of liver and intrahepatic bile duct: Secondary | ICD-10-CM | POA: Diagnosis present

## 2022-09-20 DIAGNOSIS — R531 Weakness: Secondary | ICD-10-CM | POA: Diagnosis not present

## 2022-09-20 DIAGNOSIS — Z981 Arthrodesis status: Secondary | ICD-10-CM | POA: Diagnosis not present

## 2022-09-20 DIAGNOSIS — R262 Difficulty in walking, not elsewhere classified: Secondary | ICD-10-CM | POA: Diagnosis not present

## 2022-09-20 DIAGNOSIS — J439 Emphysema, unspecified: Secondary | ICD-10-CM | POA: Diagnosis not present

## 2022-09-20 DIAGNOSIS — M40204 Unspecified kyphosis, thoracic region: Secondary | ICD-10-CM | POA: Diagnosis not present

## 2022-09-20 LAB — HEPATIC FUNCTION PANEL
ALT: 19 U/L (ref 0–44)
AST: 19 U/L (ref 15–41)
Albumin: 4.3 g/dL (ref 3.5–5.0)
Alkaline Phosphatase: 124 U/L (ref 38–126)
Bilirubin, Direct: <0.1 mg/dL (ref 0.0–0.2)
Total Bilirubin: 0.7 mg/dL (ref 0.3–1.2)
Total Protein: 7 g/dL (ref 6.5–8.1)

## 2022-09-20 LAB — CBC WITH DIFFERENTIAL/PLATELET
Abs Immature Granulocytes: 0.02 10*3/uL (ref 0.00–0.07)
Basophils Absolute: 0 10*3/uL (ref 0.0–0.1)
Basophils Relative: 0 %
Eosinophils Absolute: 0 10*3/uL (ref 0.0–0.5)
Eosinophils Relative: 0 %
HCT: 46.3 % — ABNORMAL HIGH (ref 36.0–46.0)
Hemoglobin: 15.4 g/dL — ABNORMAL HIGH (ref 12.0–15.0)
Immature Granulocytes: 0 %
Lymphocytes Relative: 20 %
Lymphs Abs: 1.4 10*3/uL (ref 0.7–4.0)
MCH: 28.8 pg (ref 26.0–34.0)
MCHC: 33.3 g/dL (ref 30.0–36.0)
MCV: 86.7 fL (ref 80.0–100.0)
Monocytes Absolute: 0.4 10*3/uL (ref 0.1–1.0)
Monocytes Relative: 6 %
Neutro Abs: 5 10*3/uL (ref 1.7–7.7)
Neutrophils Relative %: 74 %
Platelets: 227 10*3/uL (ref 150–400)
RBC: 5.34 MIL/uL — ABNORMAL HIGH (ref 3.87–5.11)
RDW: 12.7 % (ref 11.5–15.5)
WBC: 6.9 10*3/uL (ref 4.0–10.5)
nRBC: 0 % (ref 0.0–0.2)

## 2022-09-20 LAB — MAGNESIUM: Magnesium: 1.9 mg/dL (ref 1.7–2.4)

## 2022-09-20 LAB — BASIC METABOLIC PANEL
Anion gap: 9 (ref 5–15)
BUN: 13 mg/dL (ref 8–23)
CO2: 26 mmol/L (ref 22–32)
Calcium: 9.3 mg/dL (ref 8.9–10.3)
Chloride: 105 mmol/L (ref 98–111)
Creatinine, Ser: 0.68 mg/dL (ref 0.44–1.00)
GFR, Estimated: >60 mL/min (ref >60)
Glucose, Bld: 100 mg/dL — ABNORMAL HIGH (ref 70–99)
Potassium: 3.2 mmol/L — ABNORMAL LOW (ref 3.5–5.1)
Sodium: 140 mmol/L (ref 135–145)

## 2022-09-20 LAB — TYPE AND SCREEN
ABO/RH(D): B POS
Antibody Screen: NEGATIVE

## 2022-09-20 LAB — PROTIME-INR
INR: 1 (ref 0.8–1.2)
Prothrombin Time: 13.5 seconds (ref 11.4–15.2)

## 2022-09-20 LAB — ABO/RH: ABO/RH(D): B POS

## 2022-09-20 MED ORDER — HYDROCHLOROTHIAZIDE 12.5 MG PO TABS: 12.5000 mg | ORAL_TABLET | Freq: Every day | ORAL | Status: DC

## 2022-09-20 MED ORDER — LORAZEPAM 0.5 MG PO TABS
0.5000 mg | ORAL_TABLET | Freq: Once | ORAL | Status: AC
  Administered 2022-09-20: 0.5 mg via ORAL
  Filled 2022-09-20: qty 1

## 2022-09-20 MED ORDER — AMLODIPINE BESYLATE 10 MG PO TABS
10.0000 mg | ORAL_TABLET | Freq: Every day | ORAL | Status: DC
  Administered 2022-09-21: 10 mg via ORAL
  Filled 2022-09-20: qty 1

## 2022-09-20 MED ORDER — GADOBUTROL 1 MMOL/ML IV SOLN
7.0000 mL | Freq: Once | INTRAVENOUS | Status: AC | PRN
  Administered 2022-09-20: 7 mL via INTRAVENOUS

## 2022-09-20 MED ORDER — ACETAMINOPHEN 325 MG PO TABS
650.0000 mg | ORAL_TABLET | Freq: Four times a day (QID) | ORAL | Status: DC | PRN
  Administered 2022-09-23: 650 mg via ORAL
  Filled 2022-09-20: qty 2

## 2022-09-20 MED ORDER — ONDANSETRON HCL 4 MG/2ML IJ SOLN: 4.0000 mg | Freq: Four times a day (QID) | INTRAMUSCULAR | Status: DC | PRN

## 2022-09-20 MED ORDER — SODIUM CHLORIDE 0.9 % IV SOLN: INTRAVENOUS | Status: DC

## 2022-09-20 MED ORDER — MORPHINE SULFATE (PF) 2 MG/ML IV SOLN: 2.0000 mg | INTRAVENOUS | Status: DC | PRN

## 2022-09-20 MED ORDER — LEVOTHYROXINE SODIUM 25 MCG PO TABS
137.0000 ug | ORAL_TABLET | Freq: Every day | ORAL | Status: DC
  Administered 2022-09-21 – 2022-09-22 (×2): 137 ug via ORAL
  Filled 2022-09-20 (×3): qty 1

## 2022-09-20 MED ORDER — DEXAMETHASONE SODIUM PHOSPHATE 4 MG/ML IJ SOLN
4.0000 mg | Freq: Four times a day (QID) | INTRAMUSCULAR | Status: AC
  Administered 2022-09-20 – 2022-09-21 (×4): 4 mg via INTRAVENOUS
  Filled 2022-09-20 (×4): qty 1

## 2022-09-20 MED ORDER — MORPHINE SULFATE (PF) 2 MG/ML IV SOLN
2.0000 mg | Freq: Once | INTRAVENOUS | Status: AC
  Administered 2022-09-20: 2 mg via INTRAVENOUS
  Filled 2022-09-20: qty 1

## 2022-09-20 MED ORDER — ACETAMINOPHEN 650 MG RE SUPP: 650.0000 mg | Freq: Four times a day (QID) | RECTAL | Status: DC | PRN

## 2022-09-20 MED ORDER — POTASSIUM CHLORIDE CRYS ER 20 MEQ PO TBCR
20.0000 meq | EXTENDED_RELEASE_TABLET | Freq: Once | ORAL | Status: AC
  Administered 2022-09-20: 20 meq via ORAL
  Filled 2022-09-20: qty 1

## 2022-09-20 MED ORDER — IOHEXOL 300 MG/ML  SOLN
100.0000 mL | Freq: Once | INTRAMUSCULAR | Status: AC | PRN
  Administered 2022-09-20: 100 mL via INTRAVENOUS

## 2022-09-20 MED ORDER — LOSARTAN POTASSIUM 50 MG PO TABS
100.0000 mg | ORAL_TABLET | Freq: Every day | ORAL | Status: DC
  Administered 2022-09-21 – 2022-09-26 (×7): 100 mg via ORAL
  Filled 2022-09-20 (×7): qty 2

## 2022-09-20 MED ORDER — DIPHENHYDRAMINE HCL 50 MG/ML IJ SOLN: 12.5000 mg | Freq: Three times a day (TID) | INTRAMUSCULAR | Status: DC | PRN

## 2022-09-20 MED ORDER — ONDANSETRON HCL 4 MG PO TABS: 4.0000 mg | ORAL_TABLET | Freq: Four times a day (QID) | ORAL | Status: DC | PRN

## 2022-09-20 NOTE — ED Provider Notes (Signed)
[name] [facility] AT [facility] Provider Note   CSN: [id number] Arrival date & time: 09/20/22  1135     History  Chief Complaint  Patient presents with   Right Leg Pain    Hayley Wu is a 72 y.o. female with past medical history significant for hypertension, lumbar spondylolisthesis and spondylosis presents to the ED complaining of inability to walk for the past 4 days.  She states she is unable to move her right leg without manually moving it.  She states she feels like a nerve is compressed in her leg.  Patient was recently seen for bilateral leg weakness and pain at [facility] and is being treated conservatively with gabapentin and meloxicam.  She has an MRI scheduled for 09/26/22.  She has been getting around the house using a wheeling chair and walker.  She also reports numbness to her lower back and buttocks.  Denies loss of bladder or bowel control, urinary retention, fever, chills, headache, nausea, vomiting, dizziness, lightheadedness.         Home Medications Prior to Admission medications   Medication Sig Start Date End Date Taking? Authorizing Provider  amLODipine (NORVASC) 5 MG tablet Take 7.5 mg by mouth daily.    [provider]  HYDROcodone-acetaminophen (NORCO/VICODIN) 5-325 MG per tablet Take 1 tablet by mouth every 8 (eight) hours as needed. 11/26/13   Allena Katz, PA-C      Allergies    Erythromycin, Amoxicillin, Latex, and Prednisone    Review of Systems   Review of Systems  Constitutional:  Negative for chills and fever.  Gastrointestinal:  Negative for nausea and vomiting.  Genitourinary:  Negative for difficulty urinating.  Musculoskeletal:  Positive for gait problem. Negative for back pain and neck pain.  Neurological:  Positive for weakness and numbness. Negative for dizziness, syncope, light-headedness and headaches.    Physical Exam Updated Vital Signs BP 138/72   Pulse 84   Temp 97.6 F (36.4 C)  (Oral)   Resp 18   Ht 5\' 3"  (1.6 m)   Wt 72.6 kg   SpO2 96%   BMI 28.34 kg/m  Physical Exam Vitals and nursing note reviewed.  Constitutional:      General: She is not in acute distress.    Appearance: Normal appearance. She is not ill-appearing or diaphoretic.  HENT:     Mouth/Throat:     Mouth: Mucous membranes are moist.  Eyes:     Extraocular Movements: Extraocular movements intact.     Conjunctiva/sclera: Conjunctivae normal.     Pupils: Pupils are equal, round, and reactive to light.  Cardiovascular:     Rate and Rhythm: Normal rate and regular rhythm.  Pulmonary:     Effort: Pulmonary effort is normal.  Musculoskeletal:     Cervical back: Full passive range of motion without pain.     Right lower leg: No edema.     Left lower leg: No edema.  Skin:    General: Skin is warm and dry.     Capillary Refill: Capillary refill takes less than 2 seconds.  Neurological:     Mental Status: She is alert and oriented to person, place, and time. Mental status is at baseline.     Cranial Nerves: Cranial nerves 2-12 are intact.     Sensory: Sensory deficit present.     Motor: Weakness present. No atrophy, abnormal muscle tone or pronator drift.     Coordination: Coordination is intact.     Comments:  Motor and Sensation:  2/5 strength in the right lower extremity and 5/5 strength in left lower extremity.  Decreased sensation to sharp and dull in RLE, especially when compared with LLE.  5/5 strength in bilateral upper extremities.   Patient able to sit herself up in bed with minimal assistance.  Decreased sensation to lumbar region and bilateral buttocks.    Psychiatric:        Mood and Affect: Mood normal.        Behavior: Behavior normal.     ED Results / Procedures / Treatments   Labs (all labs ordered are listed, but only abnormal results are displayed) Labs Reviewed  CBC WITH DIFFERENTIAL/PLATELET - Abnormal; Notable for the following components:      Result Value   RBC  5.34 (*)    Hemoglobin 15.4 (*)    HCT 46.3 (*)    All other components within normal limits  BASIC METABOLIC PANEL - Abnormal; Notable for the following components:   Potassium 3.2 (*)    Glucose, Bld 100 (*)    All other components within normal limits  HEPATIC FUNCTION PANEL  HEPATIC FUNCTION PANEL  MAGNESIUM    EKG None  Radiology CT CHEST ABDOMEN PELVIS W CONTRAST  Result Date: 09/20/2022 CLINICAL DATA:  Metastatic disease evaluation. Suspected diffuse osseous metastatic disease in the spine on MRI earlier today. EXAM: CT CHEST, ABDOMEN, AND PELVIS WITH CONTRAST TECHNIQUE: Multidetector CT imaging of the chest, abdomen and pelvis was performed following the standard protocol during bolus administration of intravenous contrast. RADIATION DOSE REDUCTION: This exam was performed according to the departmental dose-optimization program which includes automated exposure control, adjustment of the mA and/or kV according to patient size and/or use of iterative reconstruction technique. CONTRAST:  OMNIPAQUE IOHEXOL 300 MG/ML  SOLN COMPARISON:  Thoracic and lumbar spine MRI earlier today. Chest radiograph earlier today. No remote imaging available. FINDINGS: CT CHEST FINDINGS Cardiovascular: Aortic atherosclerosis and tortuosity. No aortic aneurysm. Normal heart size. There are coronary artery calcifications. No pericardial effusion. No central pulmonary embolus. Mediastinum/Nodes: 12 mm right subcarinal node series 2, image 26. 10 mm right hilar node series 2, image 25. No enlarged left hilar lymph nodes. Coarsened calcifications in the thyroid gland, patient with history of radioactive iodine therapy. Hypodense 10 mm isthmic nodule, no specific imaging follow-up is recommended per consensus guidelines. No esophageal wall thickening. Lungs/Pleura: Moderate emphysema. Spiculated right lower lobe nodule measuring 17 x 15 mm, series 6, image 83. Linear projections extend peripherally and abuts the  major fissure. Spiculated right upper lobe nodule measures 8 x 6 x 7 mm, series 6, image 37. Tiny right apical pulmonary nodules, series 6, images 18, 23, 25. 2 mm nodule superior segment of the right lower lobe series 6, image 41. 4 mm central right middle lobe nodule series 6, image 69. Tiny nodules in the left lung including upper lobe series 6, image 32, 35, and 64. No significant pleural effusion. Minor atelectasis in the dependent lung bases. Musculoskeletal: Thoracic spinal lesions were better assessed on MRI earlier today. Vertebral plana appearance of T7 with mass effect on the spinal canal. Lucent lesions are seen involving C6, T3, T4 posterior elements, and T8. No obvious destructive rib lesions are seen. No evidence of dominant breast mass. CT ABDOMEN PELVIS FINDINGS Hepatobiliary: Circumscribed low-density lesions in the liver, Hounsfield units of approximately 20. There are additional ill-defined liver lesions in the right lobe series 2, images 53, 54 and 58. Largest lesion in the right  lobe measures 2.2 cm. Calcified gallstone in the gallbladder. Upper normal common bile duct at 8 mm no visualized obstructing lesion or stone. Pancreas: Parenchymal atrophy, no evidence of pancreatic mass or inflammation. Spleen: Normal in size without focal abnormality. Adrenals/Urinary Tract: No adrenal nodules. No hydronephrosis or suspicious renal lesion. High-density material within both kidneys in the urinary bladder is likely excreted contrast. Moderate bladder distension but no wall thickening or focal lesion. Stomach/Bowel: Detailed bowel assessment is limited in the absence of enteric contrast. Minimal hiatal hernia. Stomach is decompressed. Small duodenal diverticulum. No small bowel inflammation or evidence of mass lesion. The cecum is high-riding in the right mid abdomen. Appendix not confidently visualized. Small to moderate colonic stool burden. Descending colonic diverticulosis. No diverticulitis. No  evidence of colonic mass. Vascular/Lymphatic: Aortic and branch atherosclerosis. No aortic aneurysm. The portal vein is patent. Periportal lymph nodes are not enlarged by size criteria. No retroperitoneal or pelvic adenopathy. Reproductive: Status post hysterectomy. No adnexal masses. Other: No ascites. No definite mesenteric thickening. No mesenteric nodularity. Diminutive fat containing umbilical hernia Musculoskeletal: Osseous metastatic disease was assessed on lumbar spine exam earlier today, including L2 vertebral body. Diminished cortex in the medial right acetabulum may represent metastatic disease, series 2, image 92. Irregular sclerotic lesion within the intertrochanteric left femur. No abdominal wall soft tissue abnormalities. IMPRESSION: 1. Spiculated right lower lobe pulmonary nodules measures 17 x 15 mm, suspicious for primary bronchogenic malignancy. There is also a spiculated right upper lobe nodule measuring 8 mm. This may be metastatic or second primary. 2. Multiple additional small pulmonary nodules in both lungs. 3. Enlarged right hilar and subcarinal lymph nodes, suspicious for metastatic disease. 4. Multiple hepatic lesions, suspicious for metastatic disease. There may be also additional hepatic cysts. 5. Osseous metastatic disease with thoracic and lumbar spinal lesions, better assessed on MRI earlier today. Possible lesion in the medial right acetabulum. 6. Cholelithiasis. Colonic diverticulosis without diverticulitis. Aortic Atherosclerosis (ICD10-I70.0) and Emphysema (ICD10-J43.9). Electronically Signed   By: Narda Rutherford M.D.   On: 09/20/2022 19:53   MR THORACIC SPINE W WO CONTRAST  Result Date: 09/20/2022 CLINICAL DATA:  Initial evaluation for ataxia, metastatic disease. EXAM: MRI THORACIC WITHOUT AND WITH CONTRAST TECHNIQUE: Multiplanar and multiecho pulse sequences of the thoracic spine were obtained without and with intravenous contrast. CONTRAST:  7mL GADAVIST GADOBUTROL 1  MMOL/ML IV SOLN COMPARISON:  MRI of the lumbar spine performed earlier the same day. FINDINGS: Alignment: Focal kyphotic angulation of the midthoracic spine centered at the level of T7. Straightening of the normal thoracic kyphosis elsewhere. 3 mm degenerative retrolisthesis of T12 on L1. Vertebrae: Multiple scattered osseous lesions are seen, consistent with osseous metastatic disease. Lesions are seen involving the T3, T6, T7, T8, T9, T11, and T12 vertebral bodies. Involvement of the left greater than right posterior elements at T4. Involvement is most pronounced at the T6 through T8 levels. Associated severe pathologic compression fracture of T7 with vertebral plana formation and focal kyphotic angulation. Additional more mild compression deformities at the adjacent T6 and T8 vertebral bodies. Epidural extension with tumor seen involving the adjacent ventral epidural space (series 20, image 20). Enhancing ventral epidural tumor seen extending from T5 through T9. Additional enhancing epidural tumor seen at the dorsal epidural space, extending from T4 through T9-10. Resultant cord compression at T7 and severe spinal stenosis. Patchy cord signal changes seen at this level (series 19, image 8). Slight extra osseous extension of tumor also noted within the paravertebral soft tissues at the T6  through T8 levels. Additional associated pathologic compression deformity noted at the superior endplate of T12 with up to 40% height loss and trace bony retropulsion, but no significant stenosis at this level. Additional scattered involvement of the cervical spine, most notably at C6 is noted on counter sequence. Cord: Epidural tumor with severe spinal stenosis and cord compression at T7. Patchy cord signal changes consistent with edema and/or myelomalacia. Paraspinal and other soft tissues: Paraspinous soft tissues demonstrate no other acute finding. Few scattered probable pulmonary nodules seen within the partially visualized  right lung, concerning for metastatic disease. Possible cholelithiasis noted. Disc levels: Underlying mild for age degenerative spondylosis. No other significant spinal stenosis. IMPRESSION: 1. Widespread osseous metastatic disease involving the thoracic spine as above. Associated pathologic compression fractures of T6, T7, T8, and T12. Fracture most severe at T7 where there is associated vertebral plana formation. Adjacent epidural extension as above with resultant severe spinal stenosis and cord compression at T7. Emergent neuro surgical consultation recommended. 2. Few scattered probable pulmonary nodules within the partially visualized right lung, concerning for metastatic disease. 3. Possible cholelithiasis. Critical Value/emergent results were called by telephone at the time of interpretation on 09/20/2022 at 7:13 pm to provider [facility] , who verbally acknowledged these results. Electronically Signed   By: Rise Mu M.D.   On: 09/20/2022 19:17   DG Chest Portable 1 View  Result Date: 09/20/2022 CLINICAL DATA:  Evaluate for lung lesions. MRI lumbar spine report from earlier same day describes findings suspicious for osseous metastatic disease. EXAM: PORTABLE CHEST 1 VIEW COMPARISON:  None Available. FINDINGS: Questionable spiculated nodule within the RIGHT mid/lower lung. RIGHT lung appears otherwise clear. LEFT lung appears clear. No pleural effusion or pneumothorax is seen. Heart size and mediastinal contours are within normal limits. IMPRESSION: 1. Questionable spiculated pulmonary nodule within the RIGHT mid/lower lung. Recommend chest CT for further characterization. 2. Lungs otherwise clear. No evidence of pneumonia or pulmonary edema. Electronically Signed   By: Bary Richard M.D.   On: 09/20/2022 16:02   MR Lumbar Spine W Wo Contrast  Result Date: 09/20/2022 CLINICAL DATA:  Lumbar arthritis, difficulty walking. Can not move right leg. EXAM: MRI LUMBAR SPINE WITHOUT AND WITH  CONTRAST TECHNIQUE: Multiplanar and multiecho pulse sequences of the lumbar spine were obtained without and with intravenous contrast. CONTRAST:  7mL GADAVIST GADOBUTROL 1 MMOL/ML IV SOLN COMPARISON:  None Available. FINDINGS: Segmentation: Standard; the lowest formed disc space is designated L5-S1. Alignment: There is mild levocurvature centered at L2-L3. There is grade 1 retrolisthesis of T12 on L1 through L2 on L3 and 2grade 1 anterolisthesis of L3 on L4 and L4 on L5. Vertebrae: There is mild degenerative endplate signal abnormality anteriorly at T11-T12. There is a 1.1 cm T1 hypointense, enhancing lesion along the superior T11 endplate. There is a 1.9 cm enhancing lesion along the superior T12 endplate. Much of the L2 vertebral body has been replaced. Findings are highly suspicious for osseous metastatic disease. There is extraosseous tumor along the right aspect of the L2 vertebral body extending into the L2-L3 neural foramen (12-6, 15-15). There is no appreciable epidural tumor within the spinal canal. There is compression deformity of the superior T12 endplate with approximately 10% loss of vertebral body height and no bony retropulsion, possibly acute. There is no other evidence of pathologic fracture. Conus medullaris and cauda equina: Conus extends to the L2 level. Conus and cauda equina appear normal. Paraspinal and other soft tissues: A 3.6 cm gallstone is noted. The common  bile duct is dilated measuring up to 9 mm with tapering at the level of the ampulla. There is a 2.1 cm T2 hyperintense lesion in the left pelvis abutting the medial aspect of the psoas muscle (8-37). The paraspinal soft tissues are unremarkable. Disc levels: T12-L1: Trace retrolisthesis without significant spinal canal or neural foraminal stenosis L1-L2: There is trace retrolisthesis and mild facet arthropathy but no significant spinal canal or neural foraminal stenosis L2-L3:There is a diffuse disc bulge and advanced bilateral facet  arthropathy with ligamentum flavum thickening resulting moderate spinal canal stenosis with bilateral subarticular zone narrowing, and moderate right and mild left neural foraminal stenosis. L3-L4: There is trace anterolisthesis with slight uncovering of the disc posteriorly and a minimal bulge, and advanced bilateral facet arthropathy with ligamentum flavum thickening resulting in moderate spinal canal stenosis with bilateral subarticular zone narrowing and no significant neural foraminal stenosis L4-L5: There is grade 1 anterolisthesis with uncovering of the disc posteriorly and advanced bilateral facet arthropathy resulting in moderate spinal canal stenosis with bilateral subarticular zone narrowing and no significant neural foraminal stenosis L5-S1: There is a shallow left subarticular zone protrusion and annular fissure and advanced bilateral facet arthropathy but no significant spinal canal or neural foraminal stenosis. IMPRESSION: 1. 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 No appreciable epidural tumor within the spinal canal. Recommend oncologic workup. 2. Mild compression deformity of the superior T12 endplate with approximately 10% loss of vertebral body height and no bony retropulsion consistent with pathologic fracture, possibly acute. 3. Advanced bilateral facet arthropathy at L2-L3 through L5-S1 with moderate spinal canal stenosis and bilateral subarticular zone narrowing at L2-L3 through L4-L5 4. 3.6 cm gallstone. The common bile duct is dilated measuring up to 9 mm with tapering at the level of the ampulla. Correlate with LFTs and consider dedicated imaging as indicated. 5. 2.1 cm T2 hyperintense lesion in the left pelvis abutting the medial aspect of the psoas muscle is indeterminate and may reflect an ovarian cyst, lymphocele, or inclusion cyst.  Recommend attention on follow-up imaging. Electronically Signed   By: Lesia Hausen M.D.   On: 09/20/2022 14:54    Procedures Procedures    Medications Ordered in ED Medications  dexamethasone (DECADRON) injection 4 mg (has no administration in time range)  diphenhydrAMINE (BENADRYL) injection 12.5 mg (has no administration in time range)  LORazepam (ATIVAN) tablet 0.5 mg (0.5 mg Oral Given 09/20/22 1320)  gadobutrol (GADAVIST) 1 MMOL/ML injection 7 mL (7 mLs Intravenous Contrast Given 09/20/22 1404)  LORazepam (ATIVAN) tablet 0.5 mg (0.5 mg Oral Given 09/20/22 1808)  gadobutrol (GADAVIST) 1 MMOL/ML injection 7 mL (7 mLs Intravenous Contrast Given 09/20/22 1846)  iohexol (OMNIPAQUE) 300 MG/ML solution 100 mL (100 mLs Intravenous Contrast Given 09/20/22 1857)  morphine (PF) 2 MG/ML injection 2 mg (2 mg Intravenous Given 09/20/22 2028)    ED Course/ Medical Decision Making/ A&P                             Medical Decision Making Amount and/or Complexity of Data Reviewed Labs: ordered. Radiology: ordered.  Risk Prescription drug management.   This patient presents to the ED with chief complaint(s) of bilateral leg weakness with right worse than left, numbness in RLE with pertinent past medical history of lumbar spondylosis, lumbar spondylolisthesis, tobacco abuse, hypertension.  The  complaint involves an extensive differential diagnosis and also carries with it a high risk of complications and morbidity.    The differential diagnosis includes cauda equina syndrome, spinal cord compression, herniated disc, vertebral fracture   The initial plan is to obtain baseline labs and MR imaging of lumbar spine  Additional history obtained: Records reviewed  [facility], patient was seen on 08/26/22 for bilateral lower extremity weakness.  She is being treated conservatively with meloxicam and gabapentin. MRI scheduled for later this month.   Initial Assessment:   Exam significant for 2/5 strength  in RLE with decreased sensation to light touch.  4/5 strength with normal sensation in LLE.  DP pulses are 2+ bilaterally.  No gross deformities to lower extremities.  5/5 strength in bilateral upper extremities.  Patient is able to sit herself up in bed with minimal assistance.  She does experience lower back pain with positional changes.  She has decreased sensation to light touch of lumbar region and bilateral buttocks.    Independent ECG/labs interpretation:  The following labs were independently interpreted:  CBC with erythrocytosis, elevated hemoglobin, no leukocytosis.  Metabolic panel with mild hypokalemia, no other electrolyte disturbance.  Renal function is normal.  Hepatic function normal.    Independent visualization and interpretation of imaging: I independently visualized the following imaging with scope of interpretation limited to determining acute life threatening conditions related to emergency care: chest x-ray, which revealed spiculated right sided lung mass concerning for malignancy.  MR of lumbar spine ordered due to patient symptoms.  Multiple lesions concerning for osseous metastatic disease present, no lumbar spinal cord compression.  There is some spinal stenosis.  Thoracic MRI ordered, which demonstrates multiple pathologic thoracic fractures with spinal cord compression at T7.    Patient has findings of metastatic disease, therefore, CT chest, abdomen, and pelvis were ordered to assess disease extent.  Patient has evidence of probably primary lung cancer with metastasis to lymph nodes, liver, and spine.    I agree with radiologist interpretation.    Treatment and Reassessment: Patient's MRI related anxiety was successfully managed with oral Ativan.  Upon reassessment, patient is now having some discomfort in her back.  Will give IV morphine and reassess.   Patient's pain improved following medication.   Consultations Obtained:   I requested consultation with on-call  neurosurgery provider and spoke with Dr. Yetta Barre who agreed to consult on patient.  Patient was seen following lumbar MRI results.  Dr. Yetta Barre recommended MRI of thoracic spine as lumbar findings did not explain patient's weakness.  Dr. Yetta Barre recommended patient be transferred to [facility] if thoracic MRI demonstrated spinal cord compression, otherwise, patient could remain at [facility].   Following results of thoracic MRI, neurosurgery was consulted again.  I spoke with Leo Grosser and discussed results of imaging.  Neurosurgery will see patient at [facility].    I requested consultation with on-call hospitalist provider and discussed patient case and neurosurgery recommendations with Orland Mustard, who agreed with hospital admission.   Disposition:   Patient to be admitted to [facility] with neurosurgery involvement.  Patient to be transferred via [facility] once bed becomes available.  Discussed plan with patient who is in agreement with hospital admission.    Social Determinants of Health:   Patient's  tobacco abuse   increases the complexity of managing their presentation         Final Clinical Impression(s) / ED Diagnoses Final diagnoses:  Spinal cord compression  Weakness of both lower extremities  Lung mass  Metastatic cancer to spine    Rx / DC Orders ED Discharge Orders     None         Lenard Simmer, PA-C 09/20/22 2047    Sloan Leiter, DO 09/26/22 1533

## 2022-09-20 NOTE — ED Triage Notes (Signed)
L4 and L5 are misplaced, out of line. Has arthritis in lumbar area. Unable to walk x4days. Can not move right leg without manually moving it. Feels like a nerve is compressed in that leg. Took Tylenol about 30 mins ago.

## 2022-09-20 NOTE — ED Notes (Signed)
ED TO INPATIENT HANDOFF REPORT  Name/Age/Gender Hayley Wu 72 y.o. female  Code Status    Code Status Orders  (From admission, onward)           Start     Ordered   09/20/22 2117  Full code  Continuous       Question:  By:  Answer:  Consent: discussion documented in EHR   09/20/22 2117           Code Status History     Date Active Date Inactive Code Status Order ID Comments User Context   11/26/2013 1051 11/26/2013 1538 Full Code [id number]  Janetta Hora Inpatient       Home/SNF/Other Home  Chief Complaint Cord compression [G95.20]  Level of Care/Admitting Diagnosis ED Disposition     ED Disposition  Admit   Condition  --   Comment  Hospital Area: [facility] [[id number]]  Level of Care: Progressive [102]  Admit to Progressive based on following criteria: NEUROLOGICAL AND NEUROSURGICAL complex patients with significant risk of instability, who do not meet ICU criteria, yet require close observation or frequent assessment (< / = every 2 - 4 hours) with medical / nursing intervention.  May admit patient to [facility] or [facility] if equivalent level of care is available:: No  Covid Evaluation: Asymptomatic - no recent exposure (last 10 days) testing not required  Diagnosis: Cord compression [409811]  Admitting Physician: Orland Mustard [[id number]]  Attending Physician: Orland Mustard [phone]  Certification:: I certify this patient will need inpatient services for at least 2 midnights  Estimated Length of Stay: 4          Medical History Past Medical History:  Diagnosis Date   Allergy    Contact lens/glasses fitting    Hypertension     Allergies Allergies  Allergen Reactions   Erythromycin Diarrhea   Amoxicillin Rash   Latex Rash   Prednisone Rash    IV Location/Drains/Wounds Patient Lines/Drains/Airways Status     Active Line/Drains/Airways     Name Placement date Placement time Site Days   Peripheral IV  09/20/22 20 G Left;Anterior Antecubital 09/20/22  1415  Antecubital  less than 1   Incision (Closed) 11/26/13 Arm Left 11/26/13  0952  -- 3220            Labs/Imaging Results for orders placed or performed during the hospital encounter of 09/20/22 (from the past 48 hour(s))  CBC with Differential     Status: Abnormal   Collection Time: 09/20/22 12:25 PM  Result Value Ref Range   WBC 6.9 4.0 - 10.5 K/uL   RBC 5.34 (H) 3.87 - 5.11 MIL/uL   Hemoglobin 15.4 (H) 12.0 - 15.0 g/dL   HCT 65.7 (H) 84.6 - 96.2 %   MCV 86.7 80.0 - 100.0 fL   MCH 28.8 26.0 - 34.0 pg   MCHC 33.3 30.0 - 36.0 g/dL   RDW 95.2 84.1 - 32.4 %   Platelets 227 150 - 400 K/uL   nRBC 0.0 0.0 - 0.2 %   Neutrophils Relative % 74 %   Neutro Abs 5.0 1.7 - 7.7 K/uL   Lymphocytes Relative 20 %   Lymphs Abs 1.4 0.7 - 4.0 K/uL   Monocytes Relative 6 %   Monocytes Absolute 0.4 0.1 - 1.0 K/uL   Eosinophils Relative 0 %   Eosinophils Absolute 0.0 0.0 - 0.5 K/uL   Basophils Relative 0 %   Basophils Absolute 0.0 0.0 -  0.1 K/uL   Immature Granulocytes 0 %   Abs Immature Granulocytes 0.02 0.00 - 0.07 K/uL    Comment: Performed at [facility], [address], [place], [place] [address]  Basic metabolic panel     Status: Abnormal   Collection Time: 09/20/22 12:25 PM  Result Value Ref Range   Sodium 140 135 - 145 mmol/L   Potassium 3.2 (L) 3.5 - 5.1 mmol/L   Chloride 105 98 - 111 mmol/L   CO2 26 22 - 32 mmol/L   Glucose, Bld 100 (H) 70 - 99 mg/dL    Comment: Glucose reference range applies only to samples taken after fasting for at least 8 hours.   BUN 13 8 - 23 mg/dL   Creatinine, Ser 0.71 0.44 - 1.00 mg/dL   Calcium 9.3 8.9 - 21.9 mg/dL   GFR, Estimated >75 >88 mL/min    Comment: (NOTE) Calculated using the CKD-EPI Creatinine Equation (2021)    Anion gap 9 5 - 15    Comment: Performed at [facility], [address] [place], [place] [address]  Hepatic function panel     Status:  None   Collection Time: 09/20/22  3:07 PM  Result Value Ref Range   Total Protein 7.0 6.5 - 8.1 g/dL   Albumin 4.3 3.5 - 5.0 g/dL   AST 19 15 - 41 U/L   ALT 19 0 - 44 U/L   Alkaline Phosphatase 124 38 - 126 U/L   Total Bilirubin 0.7 0.3 - 1.2 mg/dL   Bilirubin, Direct <8.2 0.0 - 0.2 mg/dL   Indirect Bilirubin NOT CALCULATED 0.3 - 0.9 mg/dL    Comment: Performed at [facility], [address] [place], [place] [address]  Magnesium     Status: None   Collection Time: 09/20/22 10:01 PM  Result Value Ref Range   Magnesium 1.9 1.7 - 2.4 mg/dL    Comment: Performed at [facility], [address] [place], [place] [address]  Protime-INR     Status: None   Collection Time: 09/20/22 10:01 PM  Result Value Ref Range   Prothrombin Time 13.5 11.4 - 15.2 seconds   INR 1.0 0.8 - 1.2    Comment: (NOTE) INR goal varies based on device and disease states. Performed at [facility], [address] [place], [place] [address]   Type and screen [facility]     Status: None (Preliminary result)   Collection Time: 09/20/22 10:05 PM  Result Value Ref Range   ABO/RH(D) PENDING    Antibody Screen PENDING    Sample Expiration      09/23/2022,2359 Performed at [facility], [address] [place], [place] [address]    CT CHEST ABDOMEN PELVIS W CONTRAST  Result Date: 09/20/2022 CLINICAL DATA:  Metastatic disease evaluation. Suspected diffuse osseous metastatic disease in the spine on MRI earlier today. EXAM: CT CHEST, ABDOMEN, AND PELVIS WITH CONTRAST TECHNIQUE: Multidetector CT imaging of the chest, abdomen and pelvis was performed following the standard protocol during bolus administration of intravenous contrast. RADIATION DOSE REDUCTION: This exam was performed according to the departmental dose-optimization program which includes automated exposure control, adjustment of the mA and/or kV according to  patient size and/or use of iterative reconstruction technique. CONTRAST:  OMNIPAQUE IOHEXOL 300 MG/ML  SOLN COMPARISON:  Thoracic and lumbar spine MRI earlier today. Chest radiograph earlier today. No remote imaging available. FINDINGS: CT CHEST FINDINGS Cardiovascular: Aortic atherosclerosis  and tortuosity. No aortic aneurysm. Normal heart size. There are coronary artery calcifications. No pericardial effusion. No central pulmonary embolus. Mediastinum/Nodes: 12 mm right subcarinal node series 2, image 26. 10 mm right hilar node series 2, image 25. No enlarged left hilar lymph nodes. Coarsened calcifications in the thyroid gland, patient with history of radioactive iodine therapy. Hypodense 10 mm isthmic nodule, no specific imaging follow-up is recommended per consensus guidelines. No esophageal wall thickening. Lungs/Pleura: Moderate emphysema. Spiculated right lower lobe nodule measuring 17 x 15 mm, series 6, image 83. Linear projections extend peripherally and abuts the major fissure. Spiculated right upper lobe nodule measures 8 x 6 x 7 mm, series 6, image 37. Tiny right apical pulmonary nodules, series 6, images 18, 23, 25. 2 mm nodule superior segment of the right lower lobe series 6, image 41. 4 mm central right middle lobe nodule series 6, image 69. Tiny nodules in the left lung including upper lobe series 6, image 32, 35, and 64. No significant pleural effusion. Minor atelectasis in the dependent lung bases. Musculoskeletal: Thoracic spinal lesions were better assessed on MRI earlier today. Vertebral plana appearance of T7 with mass effect on the spinal canal. Lucent lesions are seen involving C6, T3, T4 posterior elements, and T8. No obvious destructive rib lesions are seen. No evidence of dominant breast mass. CT ABDOMEN PELVIS FINDINGS Hepatobiliary: Circumscribed low-density lesions in the liver, Hounsfield units of approximately 20. There are additional ill-defined liver lesions in the right  lobe series 2, images 53, 54 and 58. Largest lesion in the right lobe measures 2.2 cm. Calcified gallstone in the gallbladder. Upper normal common bile duct at 8 mm no visualized obstructing lesion or stone. Pancreas: Parenchymal atrophy, no evidence of pancreatic mass or inflammation. Spleen: Normal in size without focal abnormality. Adrenals/Urinary Tract: No adrenal nodules. No hydronephrosis or suspicious renal lesion. High-density material within both kidneys in the urinary bladder is likely excreted contrast. Moderate bladder distension but no wall thickening or focal lesion. Stomach/Bowel: Detailed bowel assessment is limited in the absence of enteric contrast. Minimal hiatal hernia. Stomach is decompressed. Small duodenal diverticulum. No small bowel inflammation or evidence of mass lesion. The cecum is high-riding in the right mid abdomen. Appendix not confidently visualized. Small to moderate colonic stool burden. Descending colonic diverticulosis. No diverticulitis. No evidence of colonic mass. Vascular/Lymphatic: Aortic and branch atherosclerosis. No aortic aneurysm. The portal vein is patent. Periportal lymph nodes are not enlarged by size criteria. No retroperitoneal or pelvic adenopathy. Reproductive: Status post hysterectomy. No adnexal masses. Other: No ascites. No definite mesenteric thickening. No mesenteric nodularity. Diminutive fat containing umbilical hernia Musculoskeletal: Osseous metastatic disease was assessed on lumbar spine exam earlier today, including L2 vertebral body. Diminished cortex in the medial right acetabulum may represent metastatic disease, series 2, image 92. Irregular sclerotic lesion within the intertrochanteric left femur. No abdominal wall soft tissue abnormalities. IMPRESSION: 1. Spiculated right lower lobe pulmonary nodules measures 17 x 15 mm, suspicious for primary bronchogenic malignancy. There is also a spiculated right upper lobe nodule measuring 8 mm. This may  be metastatic or second primary. 2. Multiple additional small pulmonary nodules in both lungs. 3. Enlarged right hilar and subcarinal lymph nodes, suspicious for metastatic disease. 4. Multiple hepatic lesions, suspicious for metastatic disease. There may be also additional hepatic cysts. 5. Osseous metastatic disease with thoracic and lumbar spinal lesions, better assessed on MRI earlier today. Possible lesion in the medial right acetabulum. 6. Cholelithiasis. Colonic diverticulosis without diverticulitis. Aortic Atherosclerosis (  ICD10-I70.0) and Emphysema (ICD10-J43.9). Electronically Signed   By: Narda Rutherford M.D.   On: 09/20/2022 19:53   MR THORACIC SPINE W WO CONTRAST  Result Date: 09/20/2022 CLINICAL DATA:  Initial evaluation for ataxia, metastatic disease. EXAM: MRI THORACIC WITHOUT AND WITH CONTRAST TECHNIQUE: Multiplanar and multiecho pulse sequences of the thoracic spine were obtained without and with intravenous contrast. CONTRAST:  7mL GADAVIST GADOBUTROL 1 MMOL/ML IV SOLN COMPARISON:  MRI of the lumbar spine performed earlier the same day. FINDINGS: Alignment: Focal kyphotic angulation of the midthoracic spine centered at the level of T7. Straightening of the normal thoracic kyphosis elsewhere. 3 mm degenerative retrolisthesis of T12 on L1. Vertebrae: Multiple scattered osseous lesions are seen, consistent with osseous metastatic disease. Lesions are seen involving the T3, T6, T7, T8, T9, T11, and T12 vertebral bodies. Involvement of the left greater than right posterior elements at T4. Involvement is most pronounced at the T6 through T8 levels. Associated severe pathologic compression fracture of T7 with vertebral plana formation and focal kyphotic angulation. Additional more mild compression deformities at the adjacent T6 and T8 vertebral bodies. Epidural extension with tumor seen involving the adjacent ventral epidural space (series 20, image 20). Enhancing ventral epidural tumor seen  extending from T5 through T9. Additional enhancing epidural tumor seen at the dorsal epidural space, extending from T4 through T9-10. Resultant cord compression at T7 and severe spinal stenosis. Patchy cord signal changes seen at this level (series 19, image 8). Slight extra osseous extension of tumor also noted within the paravertebral soft tissues at the T6 through T8 levels. Additional associated pathologic compression deformity noted at the superior endplate of T12 with up to 40% height loss and trace bony retropulsion, but no significant stenosis at this level. Additional scattered involvement of the cervical spine, most notably at C6 is noted on counter sequence. Cord: Epidural tumor with severe spinal stenosis and cord compression at T7. Patchy cord signal changes consistent with edema and/or myelomalacia. Paraspinal and other soft tissues: Paraspinous soft tissues demonstrate no other acute finding. Few scattered probable pulmonary nodules seen within the partially visualized right lung, concerning for metastatic disease. Possible cholelithiasis noted. Disc levels: Underlying mild for age degenerative spondylosis. No other significant spinal stenosis. IMPRESSION: 1. Widespread osseous metastatic disease involving the thoracic spine as above. Associated pathologic compression fractures of T6, T7, T8, and T12. Fracture most severe at T7 where there is associated vertebral plana formation. Adjacent epidural extension as above with resultant severe spinal stenosis and cord compression at T7. Emergent neuro surgical consultation recommended. 2. Few scattered probable pulmonary nodules within the partially visualized right lung, concerning for metastatic disease. 3. Possible cholelithiasis. Critical Value/emergent results were called by telephone at the time of interpretation on 09/20/2022 at 7:13 pm to provider [facility] , who verbally acknowledged these results. Electronically Signed   By: Rise Mu  M.D.   On: 09/20/2022 19:17   DG Chest Portable 1 View  Result Date: 09/20/2022 CLINICAL DATA:  Evaluate for lung lesions. MRI lumbar spine report from earlier same day describes findings suspicious for osseous metastatic disease. EXAM: PORTABLE CHEST 1 VIEW COMPARISON:  None Available. FINDINGS: Questionable spiculated nodule within the RIGHT mid/lower lung. RIGHT lung appears otherwise clear. LEFT lung appears clear. No pleural effusion or pneumothorax is seen. Heart size and mediastinal contours are within normal limits. IMPRESSION: 1. Questionable spiculated pulmonary nodule within the RIGHT mid/lower lung. Recommend chest CT for further characterization. 2. Lungs otherwise clear. No evidence of pneumonia or pulmonary  edema. Electronically Signed   By: Bary Richard M.D.   On: 09/20/2022 16:02   MR Lumbar Spine W Wo Contrast  Result Date: 09/20/2022 CLINICAL DATA:  Lumbar arthritis, difficulty walking. Can not move right leg. EXAM: MRI LUMBAR SPINE WITHOUT AND WITH CONTRAST TECHNIQUE: Multiplanar and multiecho pulse sequences of the lumbar spine were obtained without and with intravenous contrast. CONTRAST:  7mL GADAVIST GADOBUTROL 1 MMOL/ML IV SOLN COMPARISON:  None Available. FINDINGS: Segmentation: Standard; the lowest formed disc space is designated L5-S1. Alignment: There is mild levocurvature centered at L2-L3. There is grade 1 retrolisthesis of T12 on L1 through L2 on L3 and 2grade 1 anterolisthesis of L3 on L4 and L4 on L5. Vertebrae: There is mild degenerative endplate signal abnormality anteriorly at T11-T12. There is a 1.1 cm T1 hypointense, enhancing lesion along the superior T11 endplate. There is a 1.9 cm enhancing lesion along the superior T12 endplate. Much of the L2 vertebral body has been replaced. Findings are highly suspicious for osseous metastatic disease. There is extraosseous tumor along the right aspect of the L2 vertebral body extending into the L2-L3 neural foramen (12-6,  15-15). There is no appreciable epidural tumor within the spinal canal. There is compression deformity of the superior T12 endplate with approximately 10% loss of vertebral body height and no bony retropulsion, possibly acute. There is no other evidence of pathologic fracture. Conus medullaris and cauda equina: Conus extends to the L2 level. Conus and cauda equina appear normal. Paraspinal and other soft tissues: A 3.6 cm gallstone is noted. The common bile duct is dilated measuring up to 9 mm with tapering at the level of the ampulla. There is a 2.1 cm T2 hyperintense lesion in the left pelvis abutting the medial aspect of the psoas muscle (8-37). The paraspinal soft tissues are unremarkable. Disc levels: T12-L1: Trace retrolisthesis without significant spinal canal or neural foraminal stenosis L1-L2: There is trace retrolisthesis and mild facet arthropathy but no significant spinal canal or neural foraminal stenosis L2-L3:There is a diffuse disc bulge and advanced bilateral facet arthropathy with ligamentum flavum thickening resulting moderate spinal canal stenosis with bilateral subarticular zone narrowing, and moderate right and mild left neural foraminal stenosis. L3-L4: There is trace anterolisthesis with slight uncovering of the disc posteriorly and a minimal bulge, and advanced bilateral facet arthropathy with ligamentum flavum thickening resulting in moderate spinal canal stenosis with bilateral subarticular zone narrowing and no significant neural foraminal stenosis L4-L5: There is grade 1 anterolisthesis with uncovering of the disc posteriorly and advanced bilateral facet arthropathy resulting in moderate spinal canal stenosis with bilateral subarticular zone narrowing and no significant neural foraminal stenosis L5-S1: There is a shallow left subarticular zone protrusion and annular fissure and advanced bilateral facet arthropathy but no significant spinal canal or neural foraminal stenosis. IMPRESSION:  1. 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 No appreciable epidural tumor within the spinal canal. Recommend oncologic workup. 2. Mild compression deformity of the superior T12 endplate with approximately 10% loss of vertebral body height and no bony retropulsion consistent with pathologic fracture, possibly acute. 3. Advanced bilateral facet arthropathy at L2-L3 through L5-S1 with moderate spinal canal stenosis and bilateral subarticular zone narrowing at L2-L3 through L4-L5 4. 3.6 cm gallstone. The common bile duct is dilated measuring up to 9 mm with tapering at the level of the ampulla. Correlate with LFTs and  consider dedicated imaging as indicated. 5. 2.1 cm T2 hyperintense lesion in the left pelvis abutting the medial aspect of the psoas muscle is indeterminate and may reflect an ovarian cyst, lymphocele, or inclusion cyst. Recommend attention on follow-up imaging. Electronically Signed   By: Lesia Hausen M.D.   On: 09/20/2022 14:54    Pending Labs Unresulted Labs (From admission, onward)     Start     Ordered   09/21/22 0500  Basic metabolic panel  Tomorrow morning,   R        09/20/22 2117   09/21/22 0500  CBC  Tomorrow morning,   R        09/20/22 2117   09/20/22 2205  ABO/Rh  Once,   R        09/20/22 2205   09/20/22 2117  HIV Antibody (routine testing w rflx)  (HIV Antibody (Routine testing w reflex) panel)  Once,   R        09/20/22 2117            Vitals/Pain Today's Vitals   09/20/22 1925 09/20/22 2151 09/20/22 2157 09/20/22 2207  BP: 138/72   138/80  Pulse: 84   84  Resp: 18   15  Temp:  98.4 F (36.9 C)    TempSrc:  Oral    SpO2: 96%   95%  Weight:      Height:      PainSc:   2      Isolation Precautions No active isolations  Medications Medications  dexamethasone (DECADRON) injection 4 mg (4 mg Intravenous  Given 09/20/22 2158)  diphenhydrAMINE (BENADRYL) injection 12.5 mg (has no administration in time range)  amLODipine (NORVASC) tablet 10 mg (has no administration in time range)  levothyroxine (SYNTHROID) tablet 137 mcg (has no administration in time range)  losartan (COZAAR) tablet 100 mg (has no administration in time range)  0.9 %  sodium chloride infusion ( Intravenous New Bag/Given 09/20/22 2158)  acetaminophen (TYLENOL) tablet 650 mg (has no administration in time range)    Or  acetaminophen (TYLENOL) suppository 650 mg (has no administration in time range)  morphine (PF) 2 MG/ML injection 2 mg (has no administration in time range)  ondansetron (ZOFRAN) tablet 4 mg (has no administration in time range)    Or  ondansetron (ZOFRAN) injection 4 mg (has no administration in time range)  LORazepam (ATIVAN) tablet 0.5 mg (0.5 mg Oral Given 09/20/22 1320)  gadobutrol (GADAVIST) 1 MMOL/ML injection 7 mL (7 mLs Intravenous Contrast Given 09/20/22 1404)  LORazepam (ATIVAN) tablet 0.5 mg (0.5 mg Oral Given 09/20/22 1808)  gadobutrol (GADAVIST) 1 MMOL/ML injection 7 mL (7 mLs Intravenous Contrast Given 09/20/22 1846)  iohexol (OMNIPAQUE) 300 MG/ML solution 100 mL (100 mLs Intravenous Contrast Given 09/20/22 1857)  morphine (PF) 2 MG/ML injection 2 mg (2 mg Intravenous Given 09/20/22 2028)  potassium chloride SA (KLOR-CON M) CR tablet 20 mEq (20 mEq Oral Given 09/20/22 2159)    Mobility  Bedrest

## 2022-09-20 NOTE — Assessment & Plan Note (Signed)
She tells me her levels have been off. Due for recheck of labs in 5/15.  Medication decreased recently on march 20th.

## 2022-09-20 NOTE — Assessment & Plan Note (Addendum)
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  Her brain MRI negative for metastasis Follow up with Oncology and radiation oncology.

## 2022-09-20 NOTE — ED Notes (Signed)
Patient transported to MRI 

## 2022-09-20 NOTE — Assessment & Plan Note (Signed)
Down to 3 cigarettes/day  55 pack year history  Smoking cessation counseling.

## 2022-09-20 NOTE — Assessment & Plan Note (Signed)
On amlodipine 10mg , losartan 100mg  and hctz 12.5mg  daily She has not taken the hctz in past 4 days Bp to goal, continue norvasc and losartan

## 2022-09-20 NOTE — Assessment & Plan Note (Signed)
Renal function stable, serum cr is 0,77, K is 4,6 and serum bicarbonate at 26, Plan to continue close follow up on renal function and electrolytes.

## 2022-09-20 NOTE — Assessment & Plan Note (Signed)
Imagine shows CBD dilated measuring up to 40mm and tapering at level of ampulla LFTs wnl and abdominal exam benign Multiple hepatic lesions seen on CT suspicious for metastatic disease

## 2022-09-20 NOTE — Consult Note (Signed)
Reason for Consult:lumbar mets Referring Physician: EDP  LASHAUNDRA LEHRMANN is an 72 y.o. female.   HPI:  72 year old female presented to the ED today with progressive leg weakness bilaterally. She states that over the last couple weeks she has noticed difficulty walking. Lately has been using a walker. She feels like her right leg is going to give out on her. She has had upper thoracic pain in the past but this has improved. She complains of lower back pain but no radicular pain in her legs. She is a smoker. No history of cancer  Past Medical History:  Diagnosis Date   Allergy    Contact lens/glasses fitting    Hypertension     Past Surgical History:  Procedure Laterality Date   ABDOMINAL HYSTERECTOMY     BREAST SURGERY  2010   rt lumpectomy-neg   OPEN REDUCTION INTERNAL FIXATION (ORIF) DISTAL RADIAL FRACTURE Left 11/26/2013   Procedure: OPEN REDUCTION INTERNAL FIXATION (ORIF) LEFT  DISTAL RADIUS ;  Surgeon: Nestor Lewandowsky, MD;  Location: [facility];  Service: Orthopedics;  Laterality: Left;  with block   TUBAL LIGATION      Allergies  Allergen Reactions   Erythromycin Diarrhea   Amoxicillin Rash   Latex Rash   Prednisone Rash    Social History   Tobacco Use   Smoking status: Every Day    Packs/day: 1    Types: Cigarettes   Smokeless tobacco: Not on file  Substance Use Topics   Alcohol use: No    Comment: rare    Family History  Problem Relation Age of Onset   Cancer Mother    Hypertension Father    Heart attack Father    Hypertension Sister    Hypertension Brother    Heart attack Brother    Cancer Daughter        thyroid   Diabetes Maternal Grandfather      Review of Systems  Positive ROS: as above  All other systems have been reviewed and were otherwise negative with the exception of those mentioned in the HPI and as above.  Objective: Vital signs in last 24 hours: Temp:  [97.4 F (36.3 C)-97.9 F (36.6 C)] 97.6 F (36.4 C) (04/12  1241) Pulse Rate:  [78-87] 79 (04/12 1245) Resp:  [15-18] 15 (04/12 1241) BP: (124-145)/(75-84) 124/84 (04/12 1245) SpO2:  [95 %-100 %] 95 % (04/12 1245) Weight:  [72.6 kg] 72.6 kg (04/12 1150)  General Appearance: Alert, cooperative, no distress, appears stated age Head: Normocephalic, without obvious abnormality, atraumatic Neck: Supple, symmetrical, trachea midline, no adenopathy; thyroid: No enlargement/tenderness/nodules; no carotid bruit or JVD Back: Symmetric, no curvature, ROM normal, no CVA tenderness Lungs: respirations unlabored Heart: Regular rate and rhythm Extremities: Extremities normal, atraumatic, no cyanosis or edema Pulses: 2+ and symmetric all extremities Skin: Skin color, texture, turgor normal, no rashes or lesions  NEUROLOGIC:   Mental status: A&O x4, no aphasia, good attention span, Memory and fund of knowledge Motor Exam - R hip flexor 2/5, right knee extension 2/5, right dorsiflexion 2/5, right EHL 4/5, left hip flexor 4/5, left knee extension 4/5, left dorsiflexion 2/5, left EHL 4/5 Sensory Exam - grossly normal Reflexes: symmetric, hyperreflexive patellar, No Hoffman's, No clonus Coordination - grossly normal Gait - not tested Balance - not testd Cranial Nerves: I: smell Not tested  II: visual acuity  OS: na    OD: na  II: visual fields Full to confrontation  II: pupils Equal, round, reactive  to light  III,VII: ptosis None  III,IV,VI: extraocular muscles  Full ROM  V: mastication   V: facial light touch sensation    V,VII: corneal reflex    VII: facial muscle function - upper    VII: facial muscle function - lower   VIII: hearing   IX: soft palate elevation    IX,X: gag reflex   XI: trapezius strength    XI: sternocleidomastoid strength   XI: neck flexion strength    XII: tongue strength      Data Review Lab Results  Component Value Date   WBC 6.9 09/20/2022   HGB 15.4 (H) 09/20/2022   HCT 46.3 (H) 09/20/2022   MCV 86.7 09/20/2022    PLT 227 09/20/2022   Lab Results  Component Value Date   NA 140 09/20/2022   K 3.2 (L) 09/20/2022   CL 105 09/20/2022   CO2 26 09/20/2022   BUN 13 09/20/2022   CREATININE 0.68 09/20/2022   GLUCOSE 100 (H) 09/20/2022   Lab Results  Component Value Date   INR 0.9 02/22/2008    Radiology: MR Lumbar Spine W Wo Contrast  Result Date: 09/20/2022 CLINICAL DATA:  Lumbar arthritis, difficulty walking. Can not move right leg. EXAM: MRI LUMBAR SPINE WITHOUT AND WITH CONTRAST TECHNIQUE: Multiplanar and multiecho pulse sequences of the lumbar spine were obtained without and with intravenous contrast. CONTRAST:  7mL GADAVIST GADOBUTROL 1 MMOL/ML IV SOLN COMPARISON:  None Available. FINDINGS: Segmentation: Standard; the lowest formed disc space is designated L5-S1. Alignment: There is mild levocurvature centered at L2-L3. There is grade 1 retrolisthesis of T12 on L1 through L2 on L3 and 2grade 1 anterolisthesis of L3 on L4 and L4 on L5. Vertebrae: There is mild degenerative endplate signal abnormality anteriorly at T11-T12. There is a 1.1 cm T1 hypointense, enhancing lesion along the superior T11 endplate. There is a 1.9 cm enhancing lesion along the superior T12 endplate. Much of the L2 vertebral body has been replaced. Findings are highly suspicious for osseous metastatic disease. There is extraosseous tumor along the right aspect of the L2 vertebral body extending into the L2-L3 neural foramen (12-6, 15-15). There is no appreciable epidural tumor within the spinal canal. There is compression deformity of the superior T12 endplate with approximately 10% loss of vertebral body height and no bony retropulsion, possibly acute. There is no other evidence of pathologic fracture. Conus medullaris and cauda equina: Conus extends to the L2 level. Conus and cauda equina appear normal. Paraspinal and other soft tissues: A 3.6 cm gallstone is noted. The common bile duct is dilated measuring up to 9 mm with tapering at  the level of the ampulla. There is a 2.1 cm T2 hyperintense lesion in the left pelvis abutting the medial aspect of the psoas muscle (8-37). The paraspinal soft tissues are unremarkable. Disc levels: T12-L1: Trace retrolisthesis without significant spinal canal or neural foraminal stenosis L1-L2: There is trace retrolisthesis and mild facet arthropathy but no significant spinal canal or neural foraminal stenosis L2-L3:There is a diffuse disc bulge and advanced bilateral facet arthropathy with ligamentum flavum thickening resulting moderate spinal canal stenosis with bilateral subarticular zone narrowing, and moderate right and mild left neural foraminal stenosis. L3-L4: There is trace anterolisthesis with slight uncovering of the disc posteriorly and a minimal bulge, and advanced bilateral facet arthropathy with ligamentum flavum thickening resulting in moderate spinal canal stenosis with bilateral subarticular zone narrowing and no significant neural foraminal stenosis L4-L5: There is grade 1 anterolisthesis with uncovering of  the disc posteriorly and advanced bilateral facet arthropathy resulting in moderate spinal canal stenosis with bilateral subarticular zone narrowing and no significant neural foraminal stenosis L5-S1: There is a shallow left subarticular zone protrusion and annular fissure and advanced bilateral facet arthropathy but no significant spinal canal or neural foraminal stenosis. IMPRESSION: 1. 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 No appreciable epidural tumor within the spinal canal. Recommend oncologic workup. 2. Mild compression deformity of the superior T12 endplate with approximately 10% loss of vertebral body height and no bony retropulsion consistent with pathologic fracture, possibly acute. 3. Advanced bilateral facet arthropathy at L2-L3  through L5-S1 with moderate spinal canal stenosis and bilateral subarticular zone narrowing at L2-L3 through L4-L5 4. 3.6 cm gallstone. The common bile duct is dilated measuring up to 9 mm with tapering at the level of the ampulla. Correlate with LFTs and consider dedicated imaging as indicated. 5. 2.1 cm T2 hyperintense lesion in the left pelvis abutting the medial aspect of the psoas muscle is indeterminate and may reflect an ovarian cyst, lymphocele, or inclusion cyst. Recommend attention on follow-up imaging. Electronically Signed   By: Lesia Hausen M.D.   On: 09/20/2022 14:54     Assessment/Plan: 72 year old female presented to the ED with progressive leg weakness. No history of cancer. MRI lumbar suspicisou for metastatic diseaseat T11, T12, and L2. There is no epidural component though and no spinal stenosis at these levels. I do not think her lumbar mri findings are explaining her leg weakness. Given her exam I think we need to MRI her thoracic spine to rule out metastatic disease here causing her symptoms. Please let us know when this is complete. Discussed plan of care with the patient with Dr. Yetta Barre at bedside as well.   [facility] 09/20/2022 4:03 PM

## 2022-09-20 NOTE — Assessment & Plan Note (Deleted)
72 year old female presenting with 2 week history of worsening right sided leg weakness and numbness found to have masses in her lungs concerning for primary lung cancer with mets to bone resulting in pathological fracture and cord compression most severe at T7 -admit to [facility] -neurosurgery consult with plans for OR tomorrow -frequent neuro checks, any worsening symptoms, contact NS -NPO after midnight -check INR, type and screen  -no issues with urination/BM at this time  -decadron per neurosurgery -morphine for pain -will need pulm/oncology consult for further work up  -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 Possibly acute compression deformity of T12.  -possible PT

## 2022-09-20 NOTE — ED Notes (Signed)
Neruosurgery repaged

## 2022-09-20 NOTE — H&P (Signed)
History and Physical    Patient: Hayley Wu WGN:[id number] DOB: [birth year] DOA: 09/20/2022 DOS: the patient was seen and examined on 09/20/2022 PCP: [name], PA-C  Patient coming from: Home - lives alone. Ambulates independently    Chief Complaint: acute right sided leg weakness   HPI: Hayley Wu is a 72 y.o. female with medical history significant of HTN, hypothyroidism, tobacco abuse presenting for acute right leg weakness and inability to walk or lift right leg. She states she started to have pain in her upper back (T4) area that she described as muscle tightening. She had an xray and was told it could be from a former compression fracture or posture related.  After 2 weeks it went away. She would have intermittent upper back pain and she would lay down and ice it and it would go away.  She started to have right hip pain since around February. She started to have a shuffle, but never went to see physician. She also describes having trouble walking at the end of February with numbness down both legs. She would have pain in her right hip when she laid on it. She was seen at PT on 4/1 and everything started to go downhill. Her legs continued to get weaker and she started to have to use a walker.  The day after PT she fell due to her right leg just giving out. Her legs have continued to be weak and today just had a really hard time moving her right leg. She can't lift it off of the bed. She has decreased sensation in her legs. She has some saddle paraesthesias and even some numbness around her waist. No urinary incontinence.    Denies any fever/chills, vision changes/headaches, chest pain or palpitations, shortness of breath or cough, abdominal pain, N/V/D, dysuria or leg swelling.   Smoker: 3 cig/day and no alcohol. Started smoking at age 55 years and smoked 1 PPD.   ER Course:  vitals: afebrile, bp: 138/72, HR: 82, RR: 18, oxygen: 96% Ra Pertinent labs: hgb: 15.4, potassium:  3.2,  CXR:  Questionable spiculated pulmonary nodule within the RIGHT mid/lower lung. Recommend chest CT for further characterization. 2. Lungs otherwise clear. No evidence of pneumonia or pulmonary edema. CT chest/abdomen/pelvis: Spiculated right lower lobe pulmonary nodules measures 17 x 15 mm, suspicious for primary bronchogenic malignancy. There is also a spiculated right upper lobe nodule measuring 8 mm. This may be metastatic or second primary. 2. Multiple additional small pulmonary nodules in both lungs. 3. Enlarged right hilar and subcarinal lymph nodes, suspicious for metastatic disease. 4. Multiple hepatic lesions, suspicious for metastatic disease. There may be also additional hepatic cysts. 5. Osseous metastatic disease with thoracic and lumbar spinal lesions, better assessed on MRI earlier today. Possible lesion in the medial right acetabulum. 6. Cholelithiasis. Colonic diverticulosis without diverticulitis.  MRI thoracic spine: Widespread osseous metastatic disease involving the thoracic spine as above. Associated pathologic compression fractures of T6, T7, T8, and T12. Fracture most severe at T7 where there is associated vertebral plana formation. Adjacent epidural extension as above with resultant severe spinal stenosis and cord compression at T7. Emergent neuro surgical consultation recommended. 2. Few scattered probable pulmonary nodules within the partially visualized right lung, concerning for metastatic disease. 3. Possible cholelithiasis.  Mri lumbar:  . 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 No appreciable epidural  tumor within the spinal canal. Recommend oncologic workup. 2. Mild compression deformity of the superior T12 endplate with approximately 10% loss of vertebral body height and no bony retropulsion  consistent with pathologic fracture, possibly acute. 3. Advanced bilateral facet arthropathy at L2-L3 through L5-S1 with moderate spinal canal stenosis and bilateral subarticular zone narrowing at L2-L3 through L4-L5 4. 3.6 cm gallstone. The common bile duct is dilated measuring up to 9 mm with tapering at the level of the ampulla. Correlate with LFTs and consider dedicated imaging as indicated. 5. 2.1 cm T2 hyperintense lesion in the left pelvis abutting the medial aspect of the psoas muscle is indeterminate and may reflect an ovarian cyst, lymphocele, or inclusion cyst. Recommend attention on follow-up imaging. In ED: neurosurgery consulted. Steroids q6. TRH asked to admit.    Review of Systems: As mentioned in the history of present illness. All other systems reviewed and are negative. Past Medical History:  Diagnosis Date   Allergy    Contact lens/glasses fitting    Hypertension    Past Surgical History:  Procedure Laterality Date   ABDOMINAL HYSTERECTOMY     BREAST SURGERY  2010   rt lumpectomy-neg   OPEN REDUCTION INTERNAL FIXATION (ORIF) DISTAL RADIAL FRACTURE Left 11/26/2013   Procedure: OPEN REDUCTION INTERNAL FIXATION (ORIF) LEFT  DISTAL RADIUS ;  Surgeon: Nestor Lewandowsky, MD;  Location: [facility];  Service: Orthopedics;  Laterality: Left;  with block   TUBAL LIGATION     Social History:  reports that she has been smoking. She has been smoking an average of 1 pack per day. She does not have any smokeless tobacco history on file. She reports that she does not drink alcohol and does not use drugs.  Allergies  Allergen Reactions   Erythromycin Diarrhea   Amoxicillin Rash   Latex Rash   Prednisone Rash    Family History  Problem Relation Age of Onset   Cancer Mother    Hypertension Father    Heart attack Father    Hypertension Sister    Hypertension Brother    Heart attack Brother    Cancer Daughter        thyroid   Diabetes Maternal Grandfather      Prior to Admission medications   Medication Sig Start Date End Date Taking? Authorizing Provider  amLODipine (NORVASC) 5 MG tablet Take 7.5 mg by mouth daily.    [provider]  HYDROcodone-acetaminophen (NORCO/VICODIN) 5-325 MG per tablet Take 1 tablet by mouth every 8 (eight) hours as needed. 11/26/13   Allena Katz, PA-C    Physical Exam: Vitals:   09/20/22 1241 09/20/22 1245 09/20/22 1619 09/20/22 1925  BP: (!) 143/75 124/84 130/75 138/72  Pulse: 78 79 86 84  Resp: 15  18 18   Temp: 97.6 F (36.4 C)     TempSrc: Oral     SpO2: 95% 95% 94% 96%  Weight:      Height:       General:  Appears calm and comfortable and is in NAD Eyes:  PERRL, EOMI, normal lids, iris ENT:  grossly normal hearing, lips & tongue, mmm; appropriate dentition Neck:  no LAD, masses or thyromegaly; no carotid bruits Cardiovascular:  RRR, no m/r/g. No LE edema.  Respiratory:   CTA bilaterally with no wheezes/rales/rhonchi.  Normal respiratory effort. Abdomen:  soft, NT, ND, NABS Back:   normal alignment, no CVAT Skin:  no rash or induration seen on limited exam Musculoskeletal:  grossly normal tone  BUE. RLE: 2/5 strength. Can not lift against gravity. Sensation dulled. Can move foot. LLE: 4/5. Sensation decreased.  Lower extremity:  No LE edema.  Limited foot exam with no ulcerations.  2+ distal pulses. Psychiatric:  grossly normal mood and affect, speech fluent and appropriate, AOx3 Neurologic:  CN 2-12 grossly intact, moves all extremities in coordinated fashion, sensation intact   Radiological Exams on Admission: Independently reviewed - see discussion in A/P where applicable  CT CHEST ABDOMEN PELVIS W CONTRAST  Result Date: 09/20/2022 CLINICAL DATA:  Metastatic disease evaluation. Suspected diffuse osseous metastatic disease in the spine on MRI earlier today. EXAM: CT CHEST, ABDOMEN, AND PELVIS WITH CONTRAST TECHNIQUE: Multidetector CT imaging of the chest, abdomen and pelvis was  performed following the standard protocol during bolus administration of intravenous contrast. RADIATION DOSE REDUCTION: This exam was performed according to the departmental dose-optimization program which includes automated exposure control, adjustment of the mA and/or kV according to patient size and/or use of iterative reconstruction technique. CONTRAST:  OMNIPAQUE IOHEXOL 300 MG/ML  SOLN COMPARISON:  Thoracic and lumbar spine MRI earlier today. Chest radiograph earlier today. No remote imaging available. FINDINGS: CT CHEST FINDINGS Cardiovascular: Aortic atherosclerosis and tortuosity. No aortic aneurysm. Normal heart size. There are coronary artery calcifications. No pericardial effusion. No central pulmonary embolus. Mediastinum/Nodes: 12 mm right subcarinal node series 2, image 26. 10 mm right hilar node series 2, image 25. No enlarged left hilar lymph nodes. Coarsened calcifications in the thyroid gland, patient with history of radioactive iodine therapy. Hypodense 10 mm isthmic nodule, no specific imaging follow-up is recommended per consensus guidelines. No esophageal wall thickening. Lungs/Pleura: Moderate emphysema. Spiculated right lower lobe nodule measuring 17 x 15 mm, series 6, image 83. Linear projections extend peripherally and abuts the major fissure. Spiculated right upper lobe nodule measures 8 x 6 x 7 mm, series 6, image 37. Tiny right apical pulmonary nodules, series 6, images 18, 23, 25. 2 mm nodule superior segment of the right lower lobe series 6, image 41. 4 mm central right middle lobe nodule series 6, image 69. Tiny nodules in the left lung including upper lobe series 6, image 32, 35, and 64. No significant pleural effusion. Minor atelectasis in the dependent lung bases. Musculoskeletal: Thoracic spinal lesions were better assessed on MRI earlier today. Vertebral plana appearance of T7 with mass effect on the spinal canal. Lucent lesions are seen involving C6, T3, T4 posterior  elements, and T8. No obvious destructive rib lesions are seen. No evidence of dominant breast mass. CT ABDOMEN PELVIS FINDINGS Hepatobiliary: Circumscribed low-density lesions in the liver, Hounsfield units of approximately 20. There are additional ill-defined liver lesions in the right lobe series 2, images 53, 54 and 58. Largest lesion in the right lobe measures 2.2 cm. Calcified gallstone in the gallbladder. Upper normal common bile duct at 8 mm no visualized obstructing lesion or stone. Pancreas: Parenchymal atrophy, no evidence of pancreatic mass or inflammation. Spleen: Normal in size without focal abnormality. Adrenals/Urinary Tract: No adrenal nodules. No hydronephrosis or suspicious renal lesion. High-density material within both kidneys in the urinary bladder is likely excreted contrast. Moderate bladder distension but no wall thickening or focal lesion. Stomach/Bowel: Detailed bowel assessment is limited in the absence of enteric contrast. Minimal hiatal hernia. Stomach is decompressed. Small duodenal diverticulum. No small bowel inflammation or evidence of mass lesion. The cecum is high-riding in the right mid abdomen. Appendix not confidently visualized. Small to moderate colonic stool burden. Descending colonic diverticulosis. No diverticulitis.  No evidence of colonic mass. Vascular/Lymphatic: Aortic and branch atherosclerosis. No aortic aneurysm. The portal vein is patent. Periportal lymph nodes are not enlarged by size criteria. No retroperitoneal or pelvic adenopathy. Reproductive: Status post hysterectomy. No adnexal masses. Other: No ascites. No definite mesenteric thickening. No mesenteric nodularity. Diminutive fat containing umbilical hernia Musculoskeletal: Osseous metastatic disease was assessed on lumbar spine exam earlier today, including L2 vertebral body. Diminished cortex in the medial right acetabulum may represent metastatic disease, series 2, image 92. Irregular sclerotic lesion  within the intertrochanteric left femur. No abdominal wall soft tissue abnormalities. IMPRESSION: 1. Spiculated right lower lobe pulmonary nodules measures 17 x 15 mm, suspicious for primary bronchogenic malignancy. There is also a spiculated right upper lobe nodule measuring 8 mm. This may be metastatic or second primary. 2. Multiple additional small pulmonary nodules in both lungs. 3. Enlarged right hilar and subcarinal lymph nodes, suspicious for metastatic disease. 4. Multiple hepatic lesions, suspicious for metastatic disease. There may be also additional hepatic cysts. 5. Osseous metastatic disease with thoracic and lumbar spinal lesions, better assessed on MRI earlier today. Possible lesion in the medial right acetabulum. 6. Cholelithiasis. Colonic diverticulosis without diverticulitis. Aortic Atherosclerosis (ICD10-I70.0) and Emphysema (ICD10-J43.9). Electronically Signed   By: Narda Rutherford M.D.   On: 09/20/2022 19:53   MR THORACIC SPINE W WO CONTRAST  Result Date: 09/20/2022 CLINICAL DATA:  Initial evaluation for ataxia, metastatic disease. EXAM: MRI THORACIC WITHOUT AND WITH CONTRAST TECHNIQUE: Multiplanar and multiecho pulse sequences of the thoracic spine were obtained without and with intravenous contrast. CONTRAST:  7mL GADAVIST GADOBUTROL 1 MMOL/ML IV SOLN COMPARISON:  MRI of the lumbar spine performed earlier the same day. FINDINGS: Alignment: Focal kyphotic angulation of the midthoracic spine centered at the level of T7. Straightening of the normal thoracic kyphosis elsewhere. 3 mm degenerative retrolisthesis of T12 on L1. Vertebrae: Multiple scattered osseous lesions are seen, consistent with osseous metastatic disease. Lesions are seen involving the T3, T6, T7, T8, T9, T11, and T12 vertebral bodies. Involvement of the left greater than right posterior elements at T4. Involvement is most pronounced at the T6 through T8 levels. Associated severe pathologic compression fracture of T7 with  vertebral plana formation and focal kyphotic angulation. Additional more mild compression deformities at the adjacent T6 and T8 vertebral bodies. Epidural extension with tumor seen involving the adjacent ventral epidural space (series 20, image 20). Enhancing ventral epidural tumor seen extending from T5 through T9. Additional enhancing epidural tumor seen at the dorsal epidural space, extending from T4 through T9-10. Resultant cord compression at T7 and severe spinal stenosis. Patchy cord signal changes seen at this level (series 19, image 8). Slight extra osseous extension of tumor also noted within the paravertebral soft tissues at the T6 through T8 levels. Additional associated pathologic compression deformity noted at the superior endplate of T12 with up to 40% height loss and trace bony retropulsion, but no significant stenosis at this level. Additional scattered involvement of the cervical spine, most notably at C6 is noted on counter sequence. Cord: Epidural tumor with severe spinal stenosis and cord compression at T7. Patchy cord signal changes consistent with edema and/or myelomalacia. Paraspinal and other soft tissues: Paraspinous soft tissues demonstrate no other acute finding. Few scattered probable pulmonary nodules seen within the partially visualized right lung, concerning for metastatic disease. Possible cholelithiasis noted. Disc levels: Underlying mild for age degenerative spondylosis. No other significant spinal stenosis. IMPRESSION: 1. Widespread osseous metastatic disease involving the thoracic spine as above. Associated pathologic  compression fractures of T6, T7, T8, and T12. Fracture most severe at T7 where there is associated vertebral plana formation. Adjacent epidural extension as above with resultant severe spinal stenosis and cord compression at T7. Emergent neuro surgical consultation recommended. 2. Few scattered probable pulmonary nodules within the partially visualized right lung,  concerning for metastatic disease. 3. Possible cholelithiasis. Critical Value/emergent results were called by telephone at the time of interpretation on 09/20/2022 at 7:13 pm to provider [facility] , who verbally acknowledged these results. Electronically Signed   By: Rise Mu M.D.   On: 09/20/2022 19:17   DG Chest Portable 1 View  Result Date: 09/20/2022 CLINICAL DATA:  Evaluate for lung lesions. MRI lumbar spine report from earlier same day describes findings suspicious for osseous metastatic disease. EXAM: PORTABLE CHEST 1 VIEW COMPARISON:  None Available. FINDINGS: Questionable spiculated nodule within the RIGHT mid/lower lung. RIGHT lung appears otherwise clear. LEFT lung appears clear. No pleural effusion or pneumothorax is seen. Heart size and mediastinal contours are within normal limits. IMPRESSION: 1. Questionable spiculated pulmonary nodule within the RIGHT mid/lower lung. Recommend chest CT for further characterization. 2. Lungs otherwise clear. No evidence of pneumonia or pulmonary edema. Electronically Signed   By: Bary Richard M.D.   On: 09/20/2022 16:02   MR Lumbar Spine W Wo Contrast  Result Date: 09/20/2022 CLINICAL DATA:  Lumbar arthritis, difficulty walking. Can not move right leg. EXAM: MRI LUMBAR SPINE WITHOUT AND WITH CONTRAST TECHNIQUE: Multiplanar and multiecho pulse sequences of the lumbar spine were obtained without and with intravenous contrast. CONTRAST:  7mL GADAVIST GADOBUTROL 1 MMOL/ML IV SOLN COMPARISON:  None Available. FINDINGS: Segmentation: Standard; the lowest formed disc space is designated L5-S1. Alignment: There is mild levocurvature centered at L2-L3. There is grade 1 retrolisthesis of T12 on L1 through L2 on L3 and 2grade 1 anterolisthesis of L3 on L4 and L4 on L5. Vertebrae: There is mild degenerative endplate signal abnormality anteriorly at T11-T12. There is a 1.1 cm T1 hypointense, enhancing lesion along the superior T11 endplate. There is a 1.9 cm  enhancing lesion along the superior T12 endplate. Much of the L2 vertebral body has been replaced. Findings are highly suspicious for osseous metastatic disease. There is extraosseous tumor along the right aspect of the L2 vertebral body extending into the L2-L3 neural foramen (12-6, 15-15). There is no appreciable epidural tumor within the spinal canal. There is compression deformity of the superior T12 endplate with approximately 10% loss of vertebral body height and no bony retropulsion, possibly acute. There is no other evidence of pathologic fracture. Conus medullaris and cauda equina: Conus extends to the L2 level. Conus and cauda equina appear normal. Paraspinal and other soft tissues: A 3.6 cm gallstone is noted. The common bile duct is dilated measuring up to 9 mm with tapering at the level of the ampulla. There is a 2.1 cm T2 hyperintense lesion in the left pelvis abutting the medial aspect of the psoas muscle (8-37). The paraspinal soft tissues are unremarkable. Disc levels: T12-L1: Trace retrolisthesis without significant spinal canal or neural foraminal stenosis L1-L2: There is trace retrolisthesis and mild facet arthropathy but no significant spinal canal or neural foraminal stenosis L2-L3:There is a diffuse disc bulge and advanced bilateral facet arthropathy with ligamentum flavum thickening resulting moderate spinal canal stenosis with bilateral subarticular zone narrowing, and moderate right and mild left neural foraminal stenosis. L3-L4: There is trace anterolisthesis with slight uncovering of the disc posteriorly and a minimal bulge, and advanced bilateral  facet arthropathy with ligamentum flavum thickening resulting in moderate spinal canal stenosis with bilateral subarticular zone narrowing and no significant neural foraminal stenosis L4-L5: There is grade 1 anterolisthesis with uncovering of the disc posteriorly and advanced bilateral facet arthropathy resulting in moderate spinal canal  stenosis with bilateral subarticular zone narrowing and no significant neural foraminal stenosis L5-S1: There is a shallow left subarticular zone protrusion and annular fissure and advanced bilateral facet arthropathy but no significant spinal canal or neural foraminal stenosis. IMPRESSION: 1. 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 No appreciable epidural tumor within the spinal canal. Recommend oncologic workup. 2. Mild compression deformity of the superior T12 endplate with approximately 10% loss of vertebral body height and no bony retropulsion consistent with pathologic fracture, possibly acute. 3. Advanced bilateral facet arthropathy at L2-L3 through L5-S1 with moderate spinal canal stenosis and bilateral subarticular zone narrowing at L2-L3 through L4-L5 4. 3.6 cm gallstone. The common bile duct is dilated measuring up to 9 mm with tapering at the level of the ampulla. Correlate with LFTs and consider dedicated imaging as indicated. 5. 2.1 cm T2 hyperintense lesion in the left pelvis abutting the medial aspect of the psoas muscle is indeterminate and may reflect an ovarian cyst, lymphocele, or inclusion cyst. Recommend attention on follow-up imaging. Electronically Signed   By: Lesia Hausen M.D.   On: 09/20/2022 14:54    EKG: Independently reviewed.  pending  Labs on Admission: I have personally reviewed the available labs and imaging studies at the time of the admission.  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 Active Problems:   Lung mass   Hypokalemia   Cholelithiasis   Hypertension   Hypothyroidism   Tobacco abuse    Assessment and Plan: * Cord compression from widespread  metastatic disease and pathological fracutres of thoracic spine, most severe at T81. 72 year old female presenting with 2 week history of worsening right sided leg weakness and numbness found to have masses in her lungs concerning for primary lung cancer with mets to bone resulting in pathological fracture and cord compression most severe at T7 -admit to [facility] -neurosurgery consult with plans for OR tomorrow -frequent neuro checks, any worsening symptoms, contact NS -NPO after midnight -check INR, type and screen  -no issues with urination/BM at this time  -decadron per neurosurgery -morphine for pain -will need pulm/oncology consult for further work up  -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 Possibly acute compression deformity of T12.  -possible PT   Lung mass Imaging appears to be primary lung cancer  Will need pulm/oncology consult at [facility]  -2.1 cm T2 hyperintense lesion in the left pelvis abutting the medial aspect of the psoas muscle is indeterminate and may reflect an ovarian cyst, lymphocele, or inclusion cyst. Recommend attention on follow-up imaging.  Hypokalemia Likely from hctz Check magnesium Replete   Cholelithiasis Imagine shows CBD dilated measuring up to 52mm and tapering at level of ampulla LFTs wnl and abdominal exam benign Multiple hepatic lesions seen on CT suspicious for metastatic disease   Hypertension On amlodipine 10mg , losartan 100mg  and hctz 12.5mg  daily She has not taken the  hctz in past 4 days Bp to goal, continue norvasc and losartan   Hypothyroidism She tells me her levels have been off. Due for recheck of labs in 5/15.  Medication decreased recently on march 20th.   Tobacco abuse Down to 3 cigarettes/day  55 pack year history  Desires to quit, declines nicotine patch     Advance Care Planning:   Code Status: Full Code   Consults: neurosurgery: Dr. Yetta Barre and SW    DVT Prophylaxis: SCDs  Family Communication: daughter at bedside-Kristin Swing   Severity of Illness: The appropriate patient status for this patient is INPATIENT. Inpatient status is judged to be reasonable and necessary in order to provide the required intensity of service to ensure the patient's safety. The patient's presenting symptoms, physical exam findings, and initial radiographic and laboratory data in the context of their chronic comorbidities is felt to place them at high risk for further clinical deterioration. Furthermore, it is not anticipated that the patient will be medically stable for discharge from the hospital within 2 midnights of admission.   * I certify that at the point of admission it is my clinical judgment that the patient will require inpatient hospital care spanning beyond 2 midnights from the point of admission due to high intensity of service, high risk for further deterioration and high frequency of surveillance required.*  Author: Orland Mustard, MD 09/20/2022 9:27 PM  For on call review [email or web address].

## 2022-09-20 NOTE — Progress Notes (Signed)
MRI T spine reviewed. She will need surgery tomorrow. Will order CT T spine for surgical planning. NPO after midnight

## 2022-09-21 ENCOUNTER — Inpatient Hospital Stay (HOSPITAL_COMMUNITY): Payer: PPO | Admitting: Anesthesiology

## 2022-09-21 ENCOUNTER — Other Ambulatory Visit: Payer: Self-pay

## 2022-09-21 ENCOUNTER — Inpatient Hospital Stay (HOSPITAL_COMMUNITY): Payer: PPO

## 2022-09-21 ENCOUNTER — Encounter (HOSPITAL_COMMUNITY)
Admission: EM | Disposition: A | Discharge status: Home Health | Payer: Self-pay | Source: Home / Self Care | Attending: Internal Medicine

## 2022-09-21 ENCOUNTER — Encounter (HOSPITAL_COMMUNITY): Payer: Self-pay | Admitting: Family Medicine

## 2022-09-21 DIAGNOSIS — D4989 Neoplasm of unspecified behavior of other specified sites: Secondary | ICD-10-CM | POA: Diagnosis not present

## 2022-09-21 DIAGNOSIS — M4804 Spinal stenosis, thoracic region: Secondary | ICD-10-CM

## 2022-09-21 DIAGNOSIS — M8458XA Pathological fracture in neoplastic disease, other specified site, initial encounter for fracture: Secondary | ICD-10-CM | POA: Diagnosis not present

## 2022-09-21 DIAGNOSIS — D492 Neoplasm of unspecified behavior of bone, soft tissue, and skin: Secondary | ICD-10-CM | POA: Diagnosis present

## 2022-09-21 DIAGNOSIS — G9520 Unspecified cord compression: Secondary | ICD-10-CM | POA: Diagnosis not present

## 2022-09-21 HISTORY — PX: LAMINECTOMY: SHX219

## 2022-09-21 LAB — TYPE AND SCREEN
ABO/RH(D): B POS
Antibody Screen: NEGATIVE

## 2022-09-21 LAB — HIV ANTIBODY (ROUTINE TESTING W REFLEX): HIV Screen 4th Generation wRfx: NONREACTIVE

## 2022-09-21 LAB — SURGICAL PCR SCREEN
MRSA, PCR: NEGATIVE
Staphylococcus aureus: POSITIVE — AB

## 2022-09-21 SURGERY — THORACIC LAMINECTOMY FOR TUMOR
Anesthesia: General | Site: Back

## 2022-09-21 MED ORDER — OXYCODONE HCL 5 MG PO TABS
5.0000 mg | ORAL_TABLET | ORAL | Status: DC | PRN
  Administered 2022-09-21 – 2022-09-27 (×15): 5 mg via ORAL
  Filled 2022-09-21 (×15): qty 1

## 2022-09-21 MED ORDER — PROPOFOL 10 MG/ML IV BOLUS
INTRAVENOUS | Status: AC
  Filled 2022-09-21: qty 20

## 2022-09-21 MED ORDER — ONDANSETRON HCL 4 MG/2ML IJ SOLN: 4.0000 mg | Freq: Four times a day (QID) | INTRAMUSCULAR | Status: DC | PRN

## 2022-09-21 MED ORDER — OXYCODONE HCL 5 MG PO TABS: 5.0000 mg | ORAL_TABLET | Freq: Once | ORAL | Status: DC | PRN

## 2022-09-21 MED ORDER — FENTANYL CITRATE (PF) 250 MCG/5ML IJ SOLN
INTRAMUSCULAR | Status: AC
  Filled 2022-09-21: qty 5

## 2022-09-21 MED ORDER — SODIUM CHLORIDE 0.9% FLUSH
3.0000 mL | Freq: Two times a day (BID) | INTRAVENOUS | Status: DC
  Administered 2022-09-21 – 2022-09-27 (×11): 3 mL via INTRAVENOUS

## 2022-09-21 MED ORDER — ONDANSETRON HCL 4 MG/2ML IJ SOLN
INTRAMUSCULAR | Status: AC
  Filled 2022-09-21: qty 2

## 2022-09-21 MED ORDER — BUPIVACAINE HCL (PF) 0.25 % IJ SOLN
INTRAMUSCULAR | Status: AC
  Filled 2022-09-21: qty 30

## 2022-09-21 MED ORDER — TAMSULOSIN HCL 0.4 MG PO CAPS
0.4000 mg | ORAL_CAPSULE | Freq: Every day | ORAL | Status: DC
  Administered 2022-09-21 – 2022-09-27 (×7): 0.4 mg via ORAL
  Filled 2022-09-21 (×7): qty 1

## 2022-09-21 MED ORDER — POTASSIUM CHLORIDE IN NACL 20-0.9 MEQ/L-% IV SOLN
INTRAVENOUS | Status: AC
  Filled 2022-09-21 (×2): qty 1000

## 2022-09-21 MED ORDER — FENTANYL CITRATE (PF) 100 MCG/2ML IJ SOLN: 25.0000 ug | INTRAMUSCULAR | Status: DC | PRN

## 2022-09-21 MED ORDER — CEFAZOLIN SODIUM-DEXTROSE 2-4 GM/100ML-% IV SOLN
2.0000 g | Freq: Once | INTRAVENOUS | Status: AC
  Administered 2022-09-21: 2 g via INTRAVENOUS

## 2022-09-21 MED ORDER — CELECOXIB 200 MG PO CAPS
200.0000 mg | ORAL_CAPSULE | Freq: Two times a day (BID) | ORAL | Status: DC
  Administered 2022-09-21 – 2022-09-27 (×13): 200 mg via ORAL
  Filled 2022-09-21 (×14): qty 1

## 2022-09-21 MED ORDER — CHLORHEXIDINE GLUCONATE 0.12 % MT SOLN
OROMUCOSAL | Status: AC
  Administered 2022-09-21: 15 mL via OROMUCOSAL
  Filled 2022-09-21: qty 15

## 2022-09-21 MED ORDER — PHENYLEPHRINE HCL-NACL 20-0.9 MG/250ML-% IV SOLN
INTRAVENOUS | Status: DC | PRN
  Administered 2022-09-21: 25 ug/min via INTRAVENOUS

## 2022-09-21 MED ORDER — METHOCARBAMOL 500 MG PO TABS
500.0000 mg | ORAL_TABLET | Freq: Four times a day (QID) | ORAL | Status: DC | PRN
  Administered 2022-09-21 – 2022-09-27 (×16): 500 mg via ORAL
  Filled 2022-09-21 (×17): qty 1

## 2022-09-21 MED ORDER — ONDANSETRON HCL 4 MG PO TABS: 4.0000 mg | ORAL_TABLET | Freq: Four times a day (QID) | ORAL | Status: DC | PRN

## 2022-09-21 MED ORDER — ACETAMINOPHEN 500 MG PO TABS
1000.0000 mg | ORAL_TABLET | Freq: Four times a day (QID) | ORAL | Status: AC
  Administered 2022-09-21 – 2022-09-22 (×3): 1000 mg via ORAL
  Filled 2022-09-21 (×2): qty 2

## 2022-09-21 MED ORDER — THROMBIN 20000 UNITS EX SOLR
CUTANEOUS | Status: DC | PRN
  Administered 2022-09-21: 20 mL via TOPICAL

## 2022-09-21 MED ORDER — CHLORHEXIDINE GLUCONATE 0.12 % MT SOLN: 15.0000 mL | Freq: Once | OROMUCOSAL | Status: AC

## 2022-09-21 MED ORDER — ONDANSETRON HCL 4 MG/2ML IJ SOLN
4.0000 mg | Freq: Four times a day (QID) | INTRAMUSCULAR | Status: AC | PRN
  Administered 2022-09-21: 4 mg via INTRAVENOUS

## 2022-09-21 MED ORDER — ORAL CARE MOUTH RINSE: 15.0000 mL | Freq: Once | OROMUCOSAL | Status: AC

## 2022-09-21 MED ORDER — MORPHINE SULFATE (PF) 2 MG/ML IV SOLN: 2.0000 mg | INTRAVENOUS | Status: DC | PRN

## 2022-09-21 MED ORDER — LACTATED RINGERS IV SOLN: INTRAVENOUS | Status: DC

## 2022-09-21 MED ORDER — BUPIVACAINE HCL (PF) 0.25 % IJ SOLN
INTRAMUSCULAR | Status: DC | PRN
  Administered 2022-09-21: 5 mL

## 2022-09-21 MED ORDER — 0.9 % SODIUM CHLORIDE (POUR BTL) OPTIME
TOPICAL | Status: DC | PRN
  Administered 2022-09-21: 1000 mL

## 2022-09-21 MED ORDER — PHENOL 1.4 % MT LIQD: 1.0000 | OROMUCOSAL | Status: DC | PRN

## 2022-09-21 MED ORDER — DEXAMETHASONE SODIUM PHOSPHATE 4 MG/ML IJ SOLN: 4.0000 mg | Freq: Four times a day (QID) | INTRAMUSCULAR | Status: DC

## 2022-09-21 MED ORDER — POTASSIUM CHLORIDE IN NACL 20-0.9 MEQ/L-% IV SOLN
INTRAVENOUS | Status: DC
  Filled 2022-09-21: qty 1000

## 2022-09-21 MED ORDER — SODIUM CHLORIDE 0.9% FLUSH: 3.0000 mL | INTRAVENOUS | Status: DC | PRN

## 2022-09-21 MED ORDER — CEFAZOLIN SODIUM-DEXTROSE 2-4 GM/100ML-% IV SOLN
INTRAVENOUS | Status: AC
  Filled 2022-09-21: qty 100

## 2022-09-21 MED ORDER — CEFAZOLIN SODIUM-DEXTROSE 2-4 GM/100ML-% IV SOLN
2.0000 g | Freq: Three times a day (TID) | INTRAVENOUS | Status: AC
  Administered 2022-09-21 – 2022-09-22 (×2): 2 g via INTRAVENOUS
  Filled 2022-09-21 (×2): qty 100

## 2022-09-21 MED ORDER — THROMBIN 5000 UNITS EX SOLR
OROMUCOSAL | Status: DC | PRN
  Administered 2022-09-21: 5 mL via TOPICAL

## 2022-09-21 MED ORDER — SENNA 8.6 MG PO TABS
1.0000 | ORAL_TABLET | Freq: Two times a day (BID) | ORAL | Status: DC
  Administered 2022-09-21 – 2022-09-26 (×10): 8.6 mg via ORAL
  Filled 2022-09-21 (×10): qty 1

## 2022-09-21 MED ORDER — MENTHOL 3 MG MT LOZG: 1.0000 | LOZENGE | OROMUCOSAL | Status: DC | PRN

## 2022-09-21 MED ORDER — FENTANYL CITRATE (PF) 250 MCG/5ML IJ SOLN
INTRAMUSCULAR | Status: DC | PRN
  Administered 2022-09-21 (×3): 50 ug via INTRAVENOUS
  Administered 2022-09-21: 100 ug via INTRAVENOUS

## 2022-09-21 MED ORDER — DEXAMETHASONE 4 MG PO TABS: 4.0000 mg | ORAL_TABLET | Freq: Four times a day (QID) | ORAL | Status: DC

## 2022-09-21 MED ORDER — MUPIROCIN 2 % EX OINT
1.0000 | TOPICAL_OINTMENT | Freq: Two times a day (BID) | CUTANEOUS | Status: AC
  Administered 2022-09-21 – 2022-09-25 (×10): 1 via NASAL
  Filled 2022-09-21: qty 22

## 2022-09-21 MED ORDER — DEXAMETHASONE SODIUM PHOSPHATE 10 MG/ML IJ SOLN
INTRAMUSCULAR | Status: AC
  Filled 2022-09-21: qty 1

## 2022-09-21 MED ORDER — PROPOFOL 10 MG/ML IV BOLUS
INTRAVENOUS | Status: DC | PRN
  Administered 2022-09-21: 130 mg via INTRAVENOUS

## 2022-09-21 MED ORDER — SUGAMMADEX SODIUM 200 MG/2ML IV SOLN
INTRAVENOUS | Status: DC | PRN
  Administered 2022-09-21: 200 mg via INTRAVENOUS

## 2022-09-21 MED ORDER — LIDOCAINE 2% (20 MG/ML) 5 ML SYRINGE
INTRAMUSCULAR | Status: DC | PRN
  Administered 2022-09-21: 60 mg via INTRAVENOUS

## 2022-09-21 MED ORDER — THROMBIN 5000 UNITS EX SOLR
CUTANEOUS | Status: AC
  Filled 2022-09-21: qty 5000

## 2022-09-21 MED ORDER — OXYCODONE HCL 5 MG/5ML PO SOLN: 5.0000 mg | Freq: Once | ORAL | Status: DC | PRN

## 2022-09-21 MED ORDER — ROCURONIUM BROMIDE 10 MG/ML (PF) SYRINGE
PREFILLED_SYRINGE | INTRAVENOUS | Status: DC | PRN
  Administered 2022-09-21: 50 mg via INTRAVENOUS

## 2022-09-21 MED ORDER — SODIUM CHLORIDE 0.9 % IV SOLN: 250.0000 mL | INTRAVENOUS | Status: DC

## 2022-09-21 MED ORDER — METHOCARBAMOL 1000 MG/10ML IJ SOLN: 500.0000 mg | Freq: Four times a day (QID) | INTRAVENOUS | Status: DC | PRN

## 2022-09-21 MED ORDER — THROMBIN 20000 UNITS EX SOLR
CUTANEOUS | Status: AC
  Filled 2022-09-21: qty 20000

## 2022-09-21 SURGICAL SUPPLY — 55 items
BAG COUNTER SPONGE SURGICOUNT (BAG) ×1 IMPLANT
BAND RUBBER #18 3X1/16 STRL (MISCELLANEOUS) ×2 IMPLANT
BENZOIN TINCTURE PRP APPL 2/3 (GAUZE/BANDAGES/DRESSINGS) ×1 IMPLANT
BLADE CLIPPER SURG (BLADE) IMPLANT
BUR CARBIDE MATCH 3.0 (BURR) IMPLANT
CANISTER SUCT 3000ML PPV (MISCELLANEOUS) ×1 IMPLANT
DERMABOND ADVANCED .7 DNX12 (GAUZE/BANDAGES/DRESSINGS) ×1 IMPLANT
DRAPE C-ARM 42X72 X-RAY (DRAPES) IMPLANT
DRAPE LAPAROTOMY 100X72 PEDS (DRAPES) IMPLANT
DRAPE LAPAROTOMY 100X72X124 (DRAPES) ×1 IMPLANT
DRAPE MICROSCOPE SLANT 54X150 (MISCELLANEOUS) ×1 IMPLANT
DRAPE SURG 17X23 STRL (DRAPES) ×2 IMPLANT
DRSG OPSITE POSTOP 4X6 (GAUZE/BANDAGES/DRESSINGS) IMPLANT
ELECT REM PT RETURN 9FT ADLT (ELECTROSURGICAL) ×1
ELECTRODE REM PT RTRN 9FT ADLT (ELECTROSURGICAL) ×1 IMPLANT
GAUZE 4X4 16PLY ~~LOC~~+RFID DBL (SPONGE) IMPLANT
GAUZE SPONGE 4X4 12PLY STRL (GAUZE/BANDAGES/DRESSINGS) ×1 IMPLANT
GLOVE BIO SURGEON STRL SZ7 (GLOVE) IMPLANT
GLOVE BIO SURGEON STRL SZ8 (GLOVE) ×1 IMPLANT
GLOVE BIOGEL PI IND STRL 7.0 (GLOVE) IMPLANT
GOWN STRL REUS W/ TWL LRG LVL3 (GOWN DISPOSABLE) IMPLANT
GOWN STRL REUS W/ TWL XL LVL3 (GOWN DISPOSABLE) IMPLANT
GOWN STRL REUS W/TWL 2XL LVL3 (GOWN DISPOSABLE) IMPLANT
GOWN STRL REUS W/TWL LRG LVL3 (GOWN DISPOSABLE) ×2
GOWN STRL REUS W/TWL XL LVL3 (GOWN DISPOSABLE) ×1
HEMOSTAT POWDER KIT SURGIFOAM (HEMOSTASIS) IMPLANT
HEMOSTAT SURGICEL 2X14 (HEMOSTASIS) IMPLANT
KIT BASIN OR (CUSTOM PROCEDURE TRAY) ×1 IMPLANT
KIT TURNOVER KIT B (KITS) ×1 IMPLANT
NDL SPNL 20GX3.5 QUINCKE YW (NEEDLE) IMPLANT
NEEDLE HYPO 22GX1.5 SAFETY (NEEDLE) ×1 IMPLANT
NEEDLE SPNL 20GX3.5 QUINCKE YW (NEEDLE) IMPLANT
NS IRRIG 1000ML POUR BTL (IV SOLUTION) ×1 IMPLANT
PACK LAMINECTOMY NEURO (CUSTOM PROCEDURE TRAY) ×1 IMPLANT
PATTIES SURGICAL .5 X3 (DISPOSABLE) ×1 IMPLANT
SOL ELECTROSURG ANTI STICK (MISCELLANEOUS) ×1
SOLUTION ELECTROSURG ANTI STCK (MISCELLANEOUS) ×1 IMPLANT
SPONGE SURGIFOAM ABS GEL 100 (HEMOSTASIS) IMPLANT
SPONGE T-LAP 4X18 ~~LOC~~+RFID (SPONGE) IMPLANT
STAPLER VISISTAT 35W (STAPLE) ×1 IMPLANT
STRIP CLOSURE SKIN 1/2X4 (GAUZE/BANDAGES/DRESSINGS) ×1 IMPLANT
SUT BONE WAX W31G (SUTURE) IMPLANT
SUT ETHILON 4 0 PS 2 18 (SUTURE) ×1 IMPLANT
SUT NURALON 4 0 TR CR/8 (SUTURE) IMPLANT
SUT PROLENE 6 0 BV (SUTURE) IMPLANT
SUT SILK 2 0 TIES 10X30 (SUTURE) ×1 IMPLANT
SUT VIC AB 0 CT1 18XCR BRD8 (SUTURE) IMPLANT
SUT VIC AB 0 CT1 8-18 (SUTURE) ×1
SUT VIC AB 2-0 CP2 18 (SUTURE) ×1 IMPLANT
SUT VIC AB 3-0 SH 8-18 (SUTURE) IMPLANT
SUT VICRYL 4-0 PS2 18IN ABS (SUTURE) IMPLANT
TOWEL GREEN STERILE (TOWEL DISPOSABLE) ×1 IMPLANT
TOWEL GREEN STERILE FF (TOWEL DISPOSABLE) ×1 IMPLANT
TRAY FOLEY MTR SLVR 16FR STAT (SET/KITS/TRAYS/PACK) IMPLANT
WATER STERILE IRR 1000ML POUR (IV SOLUTION) ×1 IMPLANT

## 2022-09-21 NOTE — H&P (Signed)
  Patient seen and examined this morning.  She has no significant thoracic back pain but does have low back pain.  She has improved strength in her lower extremities on steroids.  She now has 4-5 strength in the hip flexors bilaterally but still 2 out of 5 strength in the dorsiflexors.  I have reviewed her imaging including MRI of thoracic spine and CT scan of the chest.  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  I agree with the report.  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  We considered instrumented fusion but there is significant tumor burden within the adjacent vertebral bodies making the bone quite soft and she has tiny pedicles and therefore I think the risk of complications with hardware placement outweigh the risk of issues because of lack of instrumentation.  I explained this to her.  She understands the risk of the surgery include but are not limited to bleeding, infection, nerve injury, spinal cord injury, CSF leak, paralysis, lack of relief of symptoms, worsening symptoms, progressive kyphosis, instability, pain, need for further surgery and anesthesia risk including DVT pneumonia MI and death.  She agrees to proceed

## 2022-09-21 NOTE — Consult Note (Signed)
[facility] [phone]   Fax:([phone]) [phone]  CONSULT NOTE  REFERRING PHYSICIAN: Dr. Albertine Grates  REASON FOR CONSULTATION:  72 years old white female with likely metastatic lung cancer  HPI Hayley Wu is a 72 y.o. female with past medical history significant for hypertension as well as long history of smoking.  The patient mentioned that she has been complaining of back pain since November 2023.  She was seen by [facility] clinic few times in the past and she was treated with medication.  Her pain issues continues to get worse and she started having numbness in the lower extremities.  She presented to the emergency department for further evaluation.  She started with MRI of the lumbar spine without contrast yesterday and that showed findings highly suspicious for osseous metastatic disease at T11, T12 and L2 with extraosseous tumor along the right aspect of the L2 vertebral body extending into the L2-L3 neural foramen.  There was no appreciable epidural tumor within the spinal canal.  MRI of the thoracic spine performed on 09/20/2022 showed widespread osseous metastatic disease involving the thoracic spine.  There was associated pathologic compression fracture of T6, T7, T8 and T12.  The fracture most severe at T7 where there is associated vertebral plana formation.  Adjacent epidural extension with resultant severe spinal stenosis and cord compression at T7.  There was also a few scattered probable pulmonary nodules within the partially visualized right lung concerning for metastatic disease.  The patient had CT scan of the chest, abdomen and pelvis with contrast performed on 09/20/2022 and that showed spiculated right upper lobe pulmonary nodules measuring 1.7 x 1.5 cm suspicious for primary bronchogenic malignancy.  There was also a spiculated right upper lobe nodule measuring 0.8 cm and may be metastatic or second primary.  There was multiple additional small pulmonary  nodules in both lungs and enlarged right hilar and subcarinal lymph nodes suspicious for metastatic disease.  The scan also showed multiple hepatic lesions suspicious for metastatic disease with some additional hepatic cysts.  There was also osseous metastatic disease with thoracic and lumbar spine lesions that was better seen on the MRI performed at earlier.  There was also possible lesion in the medial right acetabulum.  The patient was seen by neurosurgery and on 09/21/2022 she underwent decompressive thoracic laminectomy and medial facetectomy of T7 with the spinal cord decompression and biopsy of the epidural mass by Dr. Marikay Alar.  The final pathology is still pending. When seen today the patient was still recovering from her surgery earlier today with some back pain and she is currently on pain medication.  She denied having any chest pain, shortness of breath, cough or hemoptysis.  She has no nausea, vomiting, diarrhea or constipation.  She has no headache or visual changes. Family history significant for several family members with malignancy including a daughter with thyroid cancer, mother had lung cancer, a sister had breast cancer, another sister had kidney cancer and her brother had prostate cancer. The patient is a widow and her husband who was my patient died from lung cancer 17 years ago.  The patient has 3 children 2 daughters and 1 son.  She used to work in [employer].  She has a history of smoking for around 55 years and quit a week ago.  She has no history of alcohol or drug abuse. HPI  Past Medical History:  Diagnosis Date   Allergy    Contact lens/glasses fitting  Hypertension     Past Surgical History:  Procedure Laterality Date   ABDOMINAL HYSTERECTOMY     BREAST SURGERY  2010   rt lumpectomy-neg   OPEN REDUCTION INTERNAL FIXATION (ORIF) DISTAL RADIAL FRACTURE Left 11/26/2013   Procedure: OPEN REDUCTION INTERNAL FIXATION (ORIF) LEFT  DISTAL RADIUS ;  Surgeon: Nestor Lewandowsky,  MD;  Location:  SURGERY CENTER;  Service: Orthopedics;  Laterality: Left;  with block   TUBAL LIGATION      Family History  Problem Relation Age of Onset   Cancer Mother    Hypertension Father    Heart attack Father    Hypertension Sister    Hypertension Brother    Heart attack Brother    Cancer Daughter        thyroid   Diabetes Maternal Grandfather     Social History Social History   Tobacco Use   Smoking status: Every Day    Packs/day: 1    Types: Cigarettes  Substance Use Topics   Alcohol use: No    Comment: rare   Drug use: No    Allergies  Allergen Reactions   Erythromycin Diarrhea   Amoxicillin Rash   Latex Rash   Prednisone Rash    Current Facility-Administered Medications  Medication Dose Route Frequency Provider Last Rate Last Admin   0.9 %  sodium chloride infusion  250 mL Intravenous Continuous Tia Alert, MD       0.9 % NaCl with KCl 20 mEq/ L  infusion   Intravenous Continuous Tia Alert, MD 75 mL/hr at 09/21/22 1411 New Bag at 09/21/22 1411   0.9 % NaCl with KCl 20 mEq/ L  infusion   Intravenous Continuous Albertine Grates, MD       acetaminophen (TYLENOL) tablet 650 mg  650 mg Oral Q6H PRN Orland Mustard, MD       Or   acetaminophen (TYLENOL) suppository 650 mg  650 mg Rectal Q6H PRN Orland Mustard, MD       acetaminophen (TYLENOL) tablet 1,000 mg  1,000 mg Oral Q6H Tia Alert, MD   1,000 mg at 09/21/22 1358   ceFAZolin (ANCEF) IVPB 2g/100 mL premix  2 g Intravenous Q8H Tia Alert, MD       celecoxib (CELEBREX) capsule 200 mg  200 mg Oral Q12H Tia Alert, MD   200 mg at 09/21/22 1414   diphenhydrAMINE (BENADRYL) injection 12.5 mg  12.5 mg Intravenous Q8H PRN Orland Mustard, MD       levothyroxine (SYNTHROID) tablet 137 mcg  137 mcg Oral Q0600 Orland Mustard, MD   137 mcg at 09/21/22 0500   losartan (COZAAR) tablet 100 mg  100 mg Oral QHS Orland Mustard, MD   100 mg at 09/21/22 0029   menthol-cetylpyridinium (CEPACOL) lozenge 3  mg  1 lozenge Oral PRN Tia Alert, MD       Or   phenol (CHLORASEPTIC) mouth spray 1 spray  1 spray Mouth/Throat PRN Tia Alert, MD       methocarbamol (ROBAXIN) tablet 500 mg  500 mg Oral Q6H PRN Tia Alert, MD       Or   methocarbamol (ROBAXIN) 500 mg in dextrose 5 % 50 mL IVPB  500 mg Intravenous Q6H PRN Tia Alert, MD       morphine (PF) 2 MG/ML injection 2 mg  2 mg Intravenous Q2H PRN Tia Alert, MD       mupirocin ointment (BACTROBAN) 2 %  1 Application  1 Application Nasal BID Albertine Grates, MD   1 Application at 09/21/22 1411   ondansetron (ZOFRAN) tablet 4 mg  4 mg Oral Q6H PRN Orland Mustard, MD       Or   ondansetron [facility]) injection 4 mg  4 mg Intravenous Q6H PRN Orland Mustard, MD       oxyCODONE (Oxy IR/ROXICODONE) immediate release tablet 5 mg  5 mg Oral Q3H PRN Tia Alert, MD       senna [facility]) tablet 8.6 mg  1 tablet Oral BID Tia Alert, MD       sodium chloride flush (NS) 0.9 % injection 3 mL  3 mL Intravenous Q12H Tia Alert, MD   3 mL at 09/21/22 1420   sodium chloride flush (NS) 0.9 % injection 3 mL  3 mL Intravenous PRN Tia Alert, MD        Review of Systems  Constitutional: positive for fatigue Eyes: negative Ears, nose, mouth, throat, and face: negative Respiratory: negative Cardiovascular: negative Gastrointestinal: negative Genitourinary:negative Integument/breast: negative Hematologic/lymphatic: negative Musculoskeletal:positive for back pain Neurological: positive for paresthesia Behavioral/Psych: negative Endocrine: negative Allergic/Immunologic: negative  Physical Exam  HYQ:MVHQI, healthy, no distress, well nourished, and well developed SKIN: skin color, texture, turgor are normal, no rashes or significant lesions HEAD: Normocephalic, No masses, lesions, tenderness or abnormalities EYES: normal, PERRLA, Conjunctiva are pink and non-injected EARS: External ears normal, Canals clear OROPHARYNX:no exudate and no  erythema  NECK: supple, no adenopathy, no JVD LYMPH:  no palpable lymphadenopathy, no hepatosplenomegaly BREAST:not examined LUNGS: clear to auscultation , and palpation HEART: regular rate & rhythm, no murmurs, and no gallops ABDOMEN:abdomen soft, non-tender, normal bowel sounds, and no masses or organomegaly BACK: Back symmetric, no curvature., No CVA tenderness EXTREMITIES:no joint deformities, effusion, or inflammation, no edema  NEURO: alert & oriented x 3 with fluent speech, no focal motor/sensory deficits  PERFORMANCE STATUS: ECOG 1  LABORATORY DATA: Lab Results  Component Value Date   WBC 6.9 09/20/2022   HGB 15.4 (H) 09/20/2022   HCT 46.3 (H) 09/20/2022   MCV 86.7 09/20/2022   PLT 227 09/20/2022    @LASTCHEM @  RADIOGRAPHIC STUDIES: DG Thoracic Spine 2 View  Result Date: 09/21/2022 CLINICAL DATA:  Thoracic laminectomy for tumor. EXAM: THORACIC SPINE 2 VIEWS; DG C-ARM 1-60 MIN-NO REPORT Radiation exposure index: 4.49 mGy. COMPARISON:  MRI of September 20, 2022. FINDINGS: Single intraoperative fluoroscopic image was obtained of the mid to lower thoracic spine. This demonstrates surgical probe directed toward midthoracic vertebral body, although exact level cannot be determined based on limited image. IMPRESSION: Fluoroscopic guidance provided during thoracic spinal surgery. Electronically Signed   By: Lupita Raider M.D.   On: 09/21/2022 12:07   DG C-Arm 1-60 Min-No Report  Result Date: 09/21/2022 Fluoroscopy was utilized by the requesting physician.  No radiographic interpretation.   CT CHEST ABDOMEN PELVIS W CONTRAST  Result Date: 09/20/2022 CLINICAL DATA:  Metastatic disease evaluation. Suspected diffuse osseous metastatic disease in the spine on MRI earlier today. EXAM: CT CHEST, ABDOMEN, AND PELVIS WITH CONTRAST TECHNIQUE: Multidetector CT imaging of the chest, abdomen and pelvis was performed following the standard protocol during bolus administration of intravenous  contrast. RADIATION DOSE REDUCTION: This exam was performed according to the departmental dose-optimization program which includes automated exposure control, adjustment of the mA and/or kV according to patient size and/or use of iterative reconstruction technique. CONTRAST:  OMNIPAQUE IOHEXOL 300 MG/ML  SOLN COMPARISON:  Thoracic and lumbar  spine MRI earlier today. Chest radiograph earlier today. No remote imaging available. FINDINGS: CT CHEST FINDINGS Cardiovascular: Aortic atherosclerosis and tortuosity. No aortic aneurysm. Normal heart size. There are coronary artery calcifications. No pericardial effusion. No central pulmonary embolus. Mediastinum/Nodes: 12 mm right subcarinal node series 2, image 26. 10 mm right hilar node series 2, image 25. No enlarged left hilar lymph nodes. Coarsened calcifications in the thyroid gland, patient with history of radioactive iodine therapy. Hypodense 10 mm isthmic nodule, no specific imaging follow-up is recommended per consensus guidelines. No esophageal wall thickening. Lungs/Pleura: Moderate emphysema. Spiculated right lower lobe nodule measuring 17 x 15 mm, series 6, image 83. Linear projections extend peripherally and abuts the major fissure. Spiculated right upper lobe nodule measures 8 x 6 x 7 mm, series 6, image 37. Tiny right apical pulmonary nodules, series 6, images 18, 23, 25. 2 mm nodule superior segment of the right lower lobe series 6, image 41. 4 mm central right middle lobe nodule series 6, image 69. Tiny nodules in the left lung including upper lobe series 6, image 32, 35, and 64. No significant pleural effusion. Minor atelectasis in the dependent lung bases. Musculoskeletal: Thoracic spinal lesions were better assessed on MRI earlier today. Vertebral plana appearance of T7 with mass effect on the spinal canal. Lucent lesions are seen involving C6, T3, T4 posterior elements, and T8. No obvious destructive rib lesions are seen. No evidence of dominant  breast mass. CT ABDOMEN PELVIS FINDINGS Hepatobiliary: Circumscribed low-density lesions in the liver, Hounsfield units of approximately 20. There are additional ill-defined liver lesions in the right lobe series 2, images 53, 54 and 58. Largest lesion in the right lobe measures 2.2 cm. Calcified gallstone in the gallbladder. Upper normal common bile duct at 8 mm no visualized obstructing lesion or stone. Pancreas: Parenchymal atrophy, no evidence of pancreatic mass or inflammation. Spleen: Normal in size without focal abnormality. Adrenals/Urinary Tract: No adrenal nodules. No hydronephrosis or suspicious renal lesion. High-density material within both kidneys in the urinary bladder is likely excreted contrast. Moderate bladder distension but no wall thickening or focal lesion. Stomach/Bowel: Detailed bowel assessment is limited in the absence of enteric contrast. Minimal hiatal hernia. Stomach is decompressed. Small duodenal diverticulum. No small bowel inflammation or evidence of mass lesion. The cecum is high-riding in the right mid abdomen. Appendix not confidently visualized. Small to moderate colonic stool burden. Descending colonic diverticulosis. No diverticulitis. No evidence of colonic mass. Vascular/Lymphatic: Aortic and branch atherosclerosis. No aortic aneurysm. The portal vein is patent. Periportal lymph nodes are not enlarged by size criteria. No retroperitoneal or pelvic adenopathy. Reproductive: Status post hysterectomy. No adnexal masses. Other: No ascites. No definite mesenteric thickening. No mesenteric nodularity. Diminutive fat containing umbilical hernia Musculoskeletal: Osseous metastatic disease was assessed on lumbar spine exam earlier today, including L2 vertebral body. Diminished cortex in the medial right acetabulum may represent metastatic disease, series 2, image 92. Irregular sclerotic lesion within the intertrochanteric left femur. No abdominal wall soft tissue abnormalities.  IMPRESSION: 1. Spiculated right lower lobe pulmonary nodules measures 17 x 15 mm, suspicious for primary bronchogenic malignancy. There is also a spiculated right upper lobe nodule measuring 8 mm. This may be metastatic or second primary. 2. Multiple additional small pulmonary nodules in both lungs. 3. Enlarged right hilar and subcarinal lymph nodes, suspicious for metastatic disease. 4. Multiple hepatic lesions, suspicious for metastatic disease. There may be also additional hepatic cysts. 5. Osseous metastatic disease with thoracic and lumbar spinal lesions, better assessed  on MRI earlier today. Possible lesion in the medial right acetabulum. 6. Cholelithiasis. Colonic diverticulosis without diverticulitis. Aortic Atherosclerosis (ICD10-I70.0) and Emphysema (ICD10-J43.9). Electronically Signed   By: Narda Rutherford M.D.   On: 09/20/2022 19:53   MR THORACIC SPINE W WO CONTRAST  Result Date: 09/20/2022 CLINICAL DATA:  Initial evaluation for ataxia, metastatic disease. EXAM: MRI THORACIC WITHOUT AND WITH CONTRAST TECHNIQUE: Multiplanar and multiecho pulse sequences of the thoracic spine were obtained without and with intravenous contrast. CONTRAST:  7mL GADAVIST GADOBUTROL 1 MMOL/ML IV SOLN COMPARISON:  MRI of the lumbar spine performed earlier the same day. FINDINGS: Alignment: Focal kyphotic angulation of the midthoracic spine centered at the level of T7. Straightening of the normal thoracic kyphosis elsewhere. 3 mm degenerative retrolisthesis of T12 on L1. Vertebrae: Multiple scattered osseous lesions are seen, consistent with osseous metastatic disease. Lesions are seen involving the T3, T6, T7, T8, T9, T11, and T12 vertebral bodies. Involvement of the left greater than right posterior elements at T4. Involvement is most pronounced at the T6 through T8 levels. Associated severe pathologic compression fracture of T7 with vertebral plana formation and focal kyphotic angulation. Additional more mild compression  deformities at the adjacent T6 and T8 vertebral bodies. Epidural extension with tumor seen involving the adjacent ventral epidural space (series 20, image 20). Enhancing ventral epidural tumor seen extending from T5 through T9. Additional enhancing epidural tumor seen at the dorsal epidural space, extending from T4 through T9-10. Resultant cord compression at T7 and severe spinal stenosis. Patchy cord signal changes seen at this level (series 19, image 8). Slight extra osseous extension of tumor also noted within the paravertebral soft tissues at the T6 through T8 levels. Additional associated pathologic compression deformity noted at the superior endplate of T12 with up to 40% height loss and trace bony retropulsion, but no significant stenosis at this level. Additional scattered involvement of the cervical spine, most notably at C6 is noted on counter sequence. Cord: Epidural tumor with severe spinal stenosis and cord compression at T7. Patchy cord signal changes consistent with edema and/or myelomalacia. Paraspinal and other soft tissues: Paraspinous soft tissues demonstrate no other acute finding. Few scattered probable pulmonary nodules seen within the partially visualized right lung, concerning for metastatic disease. Possible cholelithiasis noted. Disc levels: Underlying mild for age degenerative spondylosis. No other significant spinal stenosis. IMPRESSION: 1. Widespread osseous metastatic disease involving the thoracic spine as above. Associated pathologic compression fractures of T6, T7, T8, and T12. Fracture most severe at T7 where there is associated vertebral plana formation. Adjacent epidural extension as above with resultant severe spinal stenosis and cord compression at T7. Emergent neuro surgical consultation recommended. 2. Few scattered probable pulmonary nodules within the partially visualized right lung, concerning for metastatic disease. 3. Possible cholelithiasis. Critical Value/emergent  results were called by telephone at the time of interpretation on 09/20/2022 at 7:13 pm to provider [facility] , who verbally acknowledged these results. Electronically Signed   By: Rise Mu M.D.   On: 09/20/2022 19:17   DG Chest Portable 1 View  Result Date: 09/20/2022 CLINICAL DATA:  Evaluate for lung lesions. MRI lumbar spine report from earlier same day describes findings suspicious for osseous metastatic disease. EXAM: PORTABLE CHEST 1 VIEW COMPARISON:  None Available. FINDINGS: Questionable spiculated nodule within the RIGHT mid/lower lung. RIGHT lung appears otherwise clear. LEFT lung appears clear. No pleural effusion or pneumothorax is seen. Heart size and mediastinal contours are within normal limits. IMPRESSION: 1. Questionable spiculated pulmonary nodule within the  RIGHT mid/lower lung. Recommend chest CT for further characterization. 2. Lungs otherwise clear. No evidence of pneumonia or pulmonary edema. Electronically Signed   By: Bary Richard M.D.   On: 09/20/2022 16:02   MR Lumbar Spine W Wo Contrast  Result Date: 09/20/2022 CLINICAL DATA:  Lumbar arthritis, difficulty walking. Can not move right leg. EXAM: MRI LUMBAR SPINE WITHOUT AND WITH CONTRAST TECHNIQUE: Multiplanar and multiecho pulse sequences of the lumbar spine were obtained without and with intravenous contrast. CONTRAST:  58mL GADAVIST GADOBUTROL 1 MMOL/ML IV SOLN COMPARISON:  None Available. FINDINGS: Segmentation: Standard; the lowest formed disc space is designated L5-S1. Alignment: There is mild levocurvature centered at L2-L3. There is grade 1 retrolisthesis of T12 on L1 through L2 on L3 and 2grade 1 anterolisthesis of L3 on L4 and L4 on L5. Vertebrae: There is mild degenerative endplate signal abnormality anteriorly at T11-T12. There is a 1.1 cm T1 hypointense, enhancing lesion along the superior T11 endplate. There is a 1.9 cm enhancing lesion along the superior T12 endplate. Much of the L2 vertebral body has  been replaced. Findings are highly suspicious for osseous metastatic disease. There is extraosseous tumor along the right aspect of the L2 vertebral body extending into the L2-L3 neural foramen (12-6, 15-15). There is no appreciable epidural tumor within the spinal canal. There is compression deformity of the superior T12 endplate with approximately 10% loss of vertebral body height and no bony retropulsion, possibly acute. There is no other evidence of pathologic fracture. Conus medullaris and cauda equina: Conus extends to the L2 level. Conus and cauda equina appear normal. Paraspinal and other soft tissues: A 3.6 cm gallstone is noted. The common bile duct is dilated measuring up to 9 mm with tapering at the level of the ampulla. There is a 2.1 cm T2 hyperintense lesion in the left pelvis abutting the medial aspect of the psoas muscle (8-37). The paraspinal soft tissues are unremarkable. Disc levels: T12-L1: Trace retrolisthesis without significant spinal canal or neural foraminal stenosis L1-L2: There is trace retrolisthesis and mild facet arthropathy but no significant spinal canal or neural foraminal stenosis L2-L3:There is a diffuse disc bulge and advanced bilateral facet arthropathy with ligamentum flavum thickening resulting moderate spinal canal stenosis with bilateral subarticular zone narrowing, and moderate right and mild left neural foraminal stenosis. L3-L4: There is trace anterolisthesis with slight uncovering of the disc posteriorly and a minimal bulge, and advanced bilateral facet arthropathy with ligamentum flavum thickening resulting in moderate spinal canal stenosis with bilateral subarticular zone narrowing and no significant neural foraminal stenosis L4-L5: There is grade 1 anterolisthesis with uncovering of the disc posteriorly and advanced bilateral facet arthropathy resulting in moderate spinal canal stenosis with bilateral subarticular zone narrowing and no significant neural foraminal  stenosis L5-S1: There is a shallow left subarticular zone protrusion and annular fissure and advanced bilateral facet arthropathy but no significant spinal canal or neural foraminal stenosis. IMPRESSION: 1. 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 No appreciable epidural tumor within the spinal canal. Recommend oncologic workup. 2. Mild compression deformity of the superior T12 endplate with approximately 10% loss of vertebral body height and no bony retropulsion consistent with pathologic fracture, possibly acute. 3. Advanced bilateral facet arthropathy at L2-L3 through L5-S1 with moderate spinal canal stenosis and bilateral subarticular zone narrowing at L2-L3 through L4-L5 4. 3.6 cm gallstone. The common bile duct  is dilated measuring up to 9 mm with tapering at the level of the ampulla. Correlate with LFTs and consider dedicated imaging as indicated. 5. 2.1 cm T2 hyperintense lesion in the left pelvis abutting the medial aspect of the psoas muscle is indeterminate and may reflect an ovarian cyst, lymphocele, or inclusion cyst. Recommend attention on follow-up imaging. Electronically Signed   By: Lesia Hausen M.D.   On: 09/20/2022 14:54    ASSESSMENT: This is a very pleasant 72 years old white female with highly suspicious stage IV (T1b, N2, M1 C) lung cancer pending tissue diagnosis and presented with right lower lobe lung nodule in addition to smaller bilateral pulmonary nodules as well as right hilar and mediastinal lymphadenopathy in addition to liver and bone metastasis diagnosed in April 2024.   PLAN: I had a lengthy discussion with the patient today about her current disease condition and further investigation to confirm her diagnosis as well as the staging workup. I recommended for the patient to have MRI of the brain hopefully tomorrow or the day  after to rule out any brain metastasis. The patient will benefit from palliative radiotherapy to the metastatic disease in the thoracic spine especially the area of T7 after decompression laminectomy. I will arrange for the patient to have a PET scan performed on outpatient basis. If the final pathology confirms the presence of non-small cell lung cancer, adenocarcinoma we will send the tissue for molecular studies and PD-L1 expression. For pain management the patient will continue on her current pain medication and she may benefit from physical therapy before discharge and also at home. I will arrange for the patient a follow-up appointment with me at the [facility] [facility] for more detailed discussion of her treatment options based on the final pathology and staging workup. I provided the patient with my contact information and she knows to call when she gets out of the hospital for a follow-up visit.  The patient voices understanding of current disease status and treatment options and is in agreement with the current care plan.  All questions were answered. The patient knows to call the clinic with any problems, questions or concerns. We can certainly see the patient much sooner if necessary.  Thank you so much for allowing me to participate in the care of Herma Mering. I will continue to follow up the patient with you and assist in her care.  Disclaimer: This note was dictated with voice recognition software. Similar sounding words can inadvertently be transcribed and may not be corrected upon review.   Lajuana Matte September 21, 2022, 3:24 PM

## 2022-09-21 NOTE — H&P (Signed)
See previously dictated note.

## 2022-09-21 NOTE — Anesthesia Procedure Notes (Signed)
Procedure Name: Intubation Date/Time: 09/21/2022 10:33 AM  Performed by: Quentin Ore, CRNAPre-anesthesia Checklist: Patient identified, Emergency Drugs available, Suction available and Patient being monitored Patient Re-evaluated:Patient Re-evaluated prior to induction Oxygen Delivery Method: Circle system utilized Preoxygenation: Pre-oxygenation with 100% oxygen Induction Type: IV induction Ventilation: Mask ventilation without difficulty Laryngoscope Size: Mac and 3 Grade View: Grade I Tube type: Oral Tube size: 7.0 mm Number of attempts: 1 Airway Equipment and Method: Stylet Placement Confirmation: ETT inserted through vocal cords under direct vision, positive ETCO2 and breath sounds checked- equal and bilateral Secured at: 21 cm Tube secured with: Tape Dental Injury: Teeth and Oropharynx as per pre-operative assessment

## 2022-09-21 NOTE — Op Note (Signed)
09/21/2022  12:04 PM  PATIENT:  Hayley Wu  72 y.o. 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 Imaging showed typical pathologic compression fractures and bony metastasis likely from the lungs. Recommended thoracic laminectomy. Patient understood the risks, benefits, and alternatives and potential outcomes and wished to proceed.  PROCEDURE DETAILS: The patient was taken to the operating room and after induction of adequate generalized endotracheal anesthesia, the patient was rolled into the prone position on chest rolls and all pressure points were padded. The thoracic region was cleaned with Hibiclens and then prepped with DuraPrep and draped in the usual sterile fashion. 5 cc of local anesthesia was injected and then a dorsal midline incision was made and carried down to the thoracic fascia via a thoracic incision marked preoperatively with fluoroscopy. The fascia was opened and the paraspinous musculature was taken down in a subperiosteal fashion to expose T6-7 and T7-8 bilaterally. Intraoperative AP and lateral fluoroscopy confirmed my level, and then I the spinous process of T7 used a combination of the high-speed drill and the Kerrison punches to perform a complete laminectomy, medial facetectomy, and foraminotomy at T7 bilaterally.  Decompressed from the pedicle of T7 to the pedicle of  T8. the underlying yellow ligament was opened and removed in a piecemeal fashion to expose the underlying dura and exiting nerve root.  The dura was covered in thick tenacious layer which we felt was either epidural tumor or epidural fat that become thickened and scarred to the dura.  We removed this carefully from the surface of the dura a sharp nerve hook and sent several pieces for permanent pathology.  I undercut the lateral recess and dissected down until I was medial to and distal to the pedicle.  Was able to work lateral to the dura and removed several small pieces of the T7 vertebral body and sent that for pathology.  We confirmed this with lateral fluoroscopy.   I irrigated with saline solution containing bacitracin. Achieved hemostasis with bipolar cautery and Surgifoam, lined the dura with Gelfoam, and then closed the fascia with 0 Vicryl. I closed the subcutaneous tissues with 2-0 Vicryl and the subcuticular tissues with 3-0 Vicryl. The skin was then closed with benzoin and Steri-Strips. The drapes were removed, a sterile dressing was applied.  My nurse practitioner was involved in the exposure, safe retraction of the neural elements, tumor removal and the closure. the patient was awakened from general anesthesia and transferred to the recovery room in stable condition. At the end of the procedure all sponge, needle and instrument counts were correct.    PLAN OF CARE: Admit to inpatient   PATIENT DISPOSITION:  PACU - hemodynamically stable.   Delay start of Pharmacological VTE agent (>24hrs) due to surgical blood loss or risk of bleeding:  yes

## 2022-09-21 NOTE — Anesthesia Preprocedure Evaluation (Signed)
Anesthesia Evaluation  Patient identified by MRN, date of birth, ID band Patient awake    Reviewed: Allergy & Precautions, H&P , NPO status , Patient's Chart, lab work & pertinent test results  Airway Mallampati: II   Neck ROM: full    Dental   Pulmonary Current Smoker   breath sounds clear to auscultation       Cardiovascular hypertension,  Rhythm:regular Rate:Normal     Neuro/Psych    GI/Hepatic   Endo/Other  Hypothyroidism    Renal/GU      Musculoskeletal   Abdominal   Peds  Hematology   Anesthesia Other Findings   Reproductive/Obstetrics                             Anesthesia Physical Anesthesia Plan  ASA: 2  Anesthesia Plan: General   Post-op Pain Management:    Induction: Intravenous  PONV Risk Score and Plan: 2 and Ondansetron, Dexamethasone, Midazolam and Treatment may vary due to age or medical condition  Airway Management Planned: Oral ETT  Additional Equipment:   Intra-op Plan:   Post-operative Plan: Extubation in OR  Informed Consent: I have reviewed the patients History and Physical, chart, labs and discussed the procedure including the risks, benefits and alternatives for the proposed anesthesia with the patient or authorized representative who has indicated his/her understanding and acceptance.     Dental advisory given  Plan Discussed with: CRNA, Anesthesiologist and Surgeon  Anesthesia Plan Comments:        Anesthesia Quick Evaluation

## 2022-09-21 NOTE — Progress Notes (Addendum)
PROGRESS NOTE    Hayley Wu  ZOX:[id number] DOB: May 03, 1951 DOA: 09/20/2022 PCP: [name], PA-C     Brief Narrative:   HTN, hypothyroidism, tobacco abuse presenting for acute right leg weakness and inability to walk or lift right leg  CT C/A/P: Spiculated right lower lobe pulmonary nodules measures 17 x 15 mm, suspicious for primary bronchogenic malignancy. There is also a spiculated right upper lobe nodule measuring 8 mm. This may be metastatic or second primary. 2. Multiple additional small pulmonary nodules in both lungs. 3. Enlarged right hilar and subcarinal lymph nodes, suspicious for metastatic disease. 4. Multiple hepatic lesions, suspicious for metastatic disease. There may be also additional hepatic cysts. 5. Osseous metastatic disease with thoracic and lumbar spinal lesions, better assessed on MRI earlier today. Possible lesion in the medial right acetabulum. 6. Cholelithiasis. Colonic diverticulosis without diverticulitis.   MRI thoracic spine: 1. Widespread osseous metastatic disease involving the thoracic spine as above. Associated pathologic compression fractures of T6, T7, T8, and T12. Fracture most severe at T7 where there is associated vertebral plana formation. Adjacent epidural extension as above with resultant severe spinal stenosis and cord compression at T7. Emergent neuro surgical consultation recommended.  Subjective: She is seen after return from the OR, reports pain 2 out of 10, family at bedside   Assessment & Plan:  Principal Problem:   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 Arbutus Ped who will see patient in consult  6pm addendum: PVR by bladder scan is 763cc, in and out cath, continue bladder scan ,. At risk of needing foley insertion, start flomax.   Hypokalemia Likely from hctz  Magnesium 1.9 Replete , recheck in the morning  Cholelithiasis Imagine shows CBD dilated measuring up to 9mm and tapering at level of ampulla LFTs wnl and abdominal exam benign   Hypertension On amlodipine , losartan  and hctz 12.5mg  daily at home She has not taken the hctz in past 4 days Continue losartan , hold Norvasc for now  Hypothyroidism She tells me her levels have been off. Due for recheck of labs in 5/15, Medication decreased recently on march 20th.   check tsh  Tobacco abuse Down to 3 cigarettes/day  55 pack year history  Desires to quit, declines nicotine patch     .   I have Reviewed nursing notes, Vitals, pain scores, I/o's, Lab results and  imaging results since pt's last encounter, details please see discussion above  I ordered the following labs:  Unresulted Labs (From admission, onward)     Start     Ordered   09/22/22 0500  CBC  Tomorrow morning,   R        09/21/22 0913   09/22/22 0500  Basic metabolic panel  Daily,   R      09/21/22 0913   09/22/22 0500  TSH  Tomorrow morning,   R  09/21/22 1632             DVT prophylaxis: SCD's Start: 09/21/22 1340 SCDs Start: 09/20/22 2117   Code Status:   Code Status: Full Code  Family Communication: Family at bedside Disposition:   Dispo: The patient is from: Home              Anticipated d/c is to: Home with family, likely will need home health              Anticipated d/c date is:  TBD  Antimicrobials:   Anti-infectives (From admission, onward)    Start     Dose/Rate Route Frequency Ordered Stop   09/21/22 1800  ceFAZolin (ANCEF) IVPB 2g/100 mL premix        2 g 200 mL/hr over 30 Minutes Intravenous Every 8 hours 09/21/22 1340 09/22/22 0959   09/21/22 1015  ceFAZolin (ANCEF) IVPB 2g/100 mL premix        2 g 200 mL/hr over 30 Minutes Intravenous  Once 09/21/22 1012 09/21/22 1050   09/21/22 1011  ceFAZolin (ANCEF) 2-4 GM/100ML-% IVPB       Note to Pharmacy: Crissie Sickles: cabinet override      09/21/22 1011 09/21/22 1056           Objective: Vitals:   09/21/22 1245 09/21/22 1300 09/21/22 1315 09/21/22 1335  BP: 113/64 119/65 121/83 112/64  Pulse: 69 76 82 76  Resp: 12 13 18 15   Temp:  98.5 F (36.9 C) 98.2 F (36.8 C)   TempSrc:   Oral   SpO2: 94% 93% 97% 93%  Weight:      Height:        Intake/Output Summary (Last 24 hours) at 09/21/2022 1633 Last data filed at 09/21/2022 1500 Gross per 24 hour  Intake 757.51 ml  Output 100 ml  Net 657.51 ml   Filed Weights   09/20/22 1150 09/21/22 0945  Weight: 72.6 kg 72.6 kg    Examination:  General exam: alert, awake, communicative,calm, NAD Respiratory system: Clear to auscultation. Respiratory effort normal. Cardiovascular system:  RRR.  Gastrointestinal system: Abdomen is nondistended, soft and nontender.  Normal bowel sounds heard. Central nervous system: Alert and oriented. No focal neurological deficits. Extremities:  no edema Skin: No rashes, lesions or ulcers Psychiatry: Judgement and insight appear normal. Mood & affect appropriate.     Data Reviewed: I have personally reviewed  labs and visualized  imaging studies since the last encounter and formulate the plan        Scheduled Meds:  acetaminophen  1,000 mg Oral Q6H   celecoxib  200 mg Oral Q12H   levothyroxine  137 mcg Oral Q0600   losartan  100 mg Oral QHS   mupirocin ointment  1 Application Nasal BID   senna  1 tablet  Oral BID   sodium chloride flush  3 mL Intravenous Q12H   Continuous Infusions:  sodium chloride     0.9 % NaCl with KCl 20 mEq / L 75 mL/hr at 09/21/22 1601    ceFAZolin (ANCEF) IV     methocarbamol (ROBAXIN) IV       LOS: 1 day   Time spent:  Albertine Grates, MD PhD FACP [facility]  Available via Epic secure chat 7am-7pm for nonurgent issues Please page for urgent issues To page the attending provider between 7A-7P or the covering provider during after hours 7P-7A, please log into the web site [email or web address] and access using universal [place]  password for that web site. If you do not have the password, please call the hospital operator.    09/21/2022, 4:33 PM

## 2022-09-21 NOTE — Transfer of Care (Signed)
Immediate Anesthesia Transfer of Care Note  Patient: Hayley Wu  Procedure(s) Performed: THORACIC LAMINECTOMY FOR TUMOR (Back)  Patient Location: PACU  Anesthesia Type:General  Level of Consciousness: awake, alert , and oriented  Airway & Oxygen Therapy: Patient Spontanous Breathing  Post-op Assessment: Report given to RN, Post -op Vital signs reviewed and stable, and Patient moving all extremities X 4  Post vital signs: Reviewed and stable  Last Vitals:  Vitals Value Taken Time  BP 138/71 09/21/22 1215  Temp 37.1 C 09/21/22 1212  Pulse 87 09/21/22 1218  Resp 15 09/21/22 1218  SpO2 94 % 09/21/22 1218  Vitals shown include unvalidated device data.  Last Pain:  Vitals:   09/21/22 0800  TempSrc:   PainSc: 0-No pain         Complications: No notable events documented.

## 2022-09-22 ENCOUNTER — Inpatient Hospital Stay (HOSPITAL_COMMUNITY): Payer: PPO

## 2022-09-22 ENCOUNTER — Encounter (HOSPITAL_COMMUNITY): Payer: Self-pay | Admitting: Neurological Surgery

## 2022-09-22 DIAGNOSIS — G9520 Unspecified cord compression: Secondary | ICD-10-CM | POA: Diagnosis not present

## 2022-09-22 LAB — CBC
HCT: 39.3 % (ref 36.0–46.0)
Hemoglobin: 13.5 g/dL (ref 12.0–15.0)
MCH: 29.3 pg (ref 26.0–34.0)
MCHC: 34.4 g/dL (ref 30.0–36.0)
MCV: 85.2 fL (ref 80.0–100.0)
Platelets: 200 10*3/uL (ref 150–400)
RBC: 4.61 MIL/uL (ref 3.87–5.11)
RDW: 12.7 % (ref 11.5–15.5)
WBC: 9.3 10*3/uL (ref 4.0–10.5)
nRBC: 0 % (ref 0.0–0.2)

## 2022-09-22 LAB — TSH: TSH: 0.179 u[IU]/mL — ABNORMAL LOW (ref 0.350–4.500)

## 2022-09-22 LAB — BASIC METABOLIC PANEL
Anion gap: 6 (ref 5–15)
BUN: 21 mg/dL (ref 8–23)
CO2: 24 mmol/L (ref 22–32)
Calcium: 8.6 mg/dL — ABNORMAL LOW (ref 8.9–10.3)
Chloride: 107 mmol/L (ref 98–111)
Creatinine, Ser: 0.74 mg/dL (ref 0.44–1.00)
GFR, Estimated: >60 mL/min (ref >60)
Glucose, Bld: 133 mg/dL — ABNORMAL HIGH (ref 70–99)
Potassium: 3.7 mmol/L (ref 3.5–5.1)
Sodium: 137 mmol/L (ref 135–145)

## 2022-09-22 MED ORDER — LEVOTHYROXINE SODIUM 100 MCG PO TABS
100.0000 ug | ORAL_TABLET | Freq: Every day | ORAL | Status: DC
  Administered 2022-09-23 – 2022-09-27 (×5): 100 ug via ORAL
  Filled 2022-09-22 (×5): qty 1

## 2022-09-22 MED ORDER — DEXAMETHASONE 4 MG PO TABS
4.0000 mg | ORAL_TABLET | Freq: Three times a day (TID) | ORAL | Status: DC
  Administered 2022-09-22 – 2022-09-25 (×7): 4 mg via ORAL
  Filled 2022-09-22 (×11): qty 1

## 2022-09-22 NOTE — Evaluation (Signed)
Occupational Therapy Evaluation Patient Details Name: Hayley Wu MRN: [id number] DOB: 1950-06-22 Today's Date: 09/22/2022   History of Present Illness 72 yo female admitted 4/12 with RLE weakness and saddle paresthesia with back pain since Nov 23. MRI with pathologic compression fx T6-8 and T12 as well as L2-3 tumor. Ct with bil lung nodules. 4/13 T7 laminectomy and biopsy with probable metastatic lung CA. PMhx: HTN, hypothyroidism   Clinical Impression   Pt currently with functional limitations due to the deficits listed below (see OT Problem List). Prior to surgery, pt reports that she was independent with all ADL tasks and living alone. Pt plans to stay with her sister when discharged as she has all the recommended DME Pt will benefit from acute skilled OT to increase their safety and independence with ADL and functional mobility for ADL to facilitate discharge. Recommend follow-up HHOT when discharged. OT will continue to follow patient acutely.       Recommendations for follow up therapy are one component of a multi-disciplinary discharge planning process, led by the attending physician.  Recommendations may be updated based on patient status, additional functional criteria and insurance authorization.   Assistance Recommended at Discharge Frequent or constant Supervision/Assistance  Patient can return home with the following A little help with walking and/or transfers;A little help with bathing/dressing/bathroom;Help with stairs or ramp for entrance;Assist for transportation;Assistance with cooking/housework    Functional Status Assessment  Patient has had a recent decline in their functional status and demonstrates the ability to make significant improvements in function in a reasonable and predictable amount of time.  Equipment Recommendations  None recommended by OT       Precautions / Restrictions Precautions Precautions: Fall;Back Precaution Booklet Issued: Yes  (comment) Precaution Comments: Verbal education provided regarding back precautions with handout provided. Restrictions Weight Bearing Restrictions: No      Mobility Bed Mobility Overal bed mobility: Needs Assistance Bed Mobility: Rolling, Sidelying to Sit, Sit to Sidelying Rolling: Min guard Sidelying to sit: Min assist     Sit to sidelying: Min assist General bed mobility comments: VC for sequencing and hand placement. Required assist to bring trunk off of bed from sidelying. Assist to guide legs up onto bed when returning to sidelying. VC to follow back precautions Patient Response: Cooperative  Transfers Overall transfer level:  (defer to PT evaluation)               Balance Overall balance assessment: Needs assistance Sitting-balance support: Single extremity supported, Feet supported Sitting balance-Leahy Scale: Poor Sitting balance - Comments: sitting EOB. Required at least one UE to provide support during sitting.        ADL either performed or assessed with clinical judgement   ADL Overall ADL's : Needs assistance/impaired Eating/Feeding: Modified independent;Bed level   Grooming: Wash/dry hands;Wash/dry face;Oral care;Set up;Sitting   Upper Body Bathing: Minimal assistance;Cueing for safety;Cueing for compensatory techniques;Sitting   Lower Body Bathing: Maximal assistance;Sitting/lateral leans;Bed level;Cueing for back precautions   Upper Body Dressing : Minimal assistance;Cueing for sequencing;Sitting;Cueing for compensatory techniques   Lower Body Dressing: Maximal assistance;Sitting/lateral leans;Cueing for back precautions;Cueing for compensatory techniques   Toilet Transfer: Minimal assistance;Ambulation;BSC/3in1   Toileting- Architect and Hygiene: Total assistance;Sit to/from stand;Adhering to back precautions               Vision Baseline Vision/History: 1 Wears glasses (for reading) Ability to See in Adequate Light: 0  Adequate Patient Visual Report: No change from baseline Vision Assessment?: No apparent visual deficits  Pertinent Vitals/Pain Pain Assessment Pain Assessment: Faces Faces Pain Scale: Hurts little more Pain Location: back during mobility Pain Descriptors / Indicators: Grimacing Pain Intervention(s): Limited activity within patient's tolerance, Monitored during session, Repositioned     Hand Dominance Right   Extremity/Trunk Assessment Upper Extremity Assessment Upper Extremity Assessment: Overall WFL for tasks assessed   Lower Extremity Assessment Lower Extremity Assessment: Defer to PT evaluation   Cervical / Trunk Assessment Cervical / Trunk Assessment: Back Surgery   Communication Communication Communication: No difficulties   Cognition Arousal/Alertness: Awake/alert Behavior During Therapy: WFL for tasks assessed/performed Overall Cognitive Status: Within Functional Limits for tasks assessed       General Comments  VSS on RA            Home Living Family/patient expects to be discharged to:: Private residence Living Arrangements: Alone Available Help at Discharge: Family;Available 24 hours/day Type of Home: House Home Access: Ramped entrance     Home Layout: Two level;Able to live on main level with bedroom/bathroom     Bathroom Shower/Tub: Arts development officer Toilet: Handicapped height     Home Equipment: Agricultural consultant (2 wheels);Wheelchair - manual;Cane - single point;Shower seat   Additional Comments: lives alone plans to go to sister's house at discharge      Prior Functioning/Environment Prior Level of Function : Independent/Modified Independent;Driving      Mobility Comments: independent ADLs Comments: independent        OT Problem List: Decreased strength;Pain;Decreased activity tolerance;Impaired balance (sitting and/or standing);Decreased knowledge of use of DME or AE;Decreased knowledge of precautions      OT  Treatment/Interventions: Self-care/ADL training;Therapeutic exercise;Therapeutic activities;Energy conservation;DME and/or AE instruction;Patient/family education;Balance training    OT Goals(Current goals can be found in the care plan section) Acute Rehab OT Goals Patient Stated Goal: to not get out of bed again OT Goal Formulation: Patient unable to participate in goal setting Time For Goal Achievement: 10/06/22 Potential to Achieve Goals: Good  OT Frequency: Min 1X/week       AM-PAC OT "6 Clicks" Daily Activity     Outcome Measure Help from another person eating meals?: None Help from another person taking care of personal grooming?: A Little Help from another person toileting, which includes using toliet, bedpan, or urinal?: Total Help from another person bathing (including washing, rinsing, drying)?: A Lot Help from another person to put on and taking off regular upper body clothing?: A Little Help from another person to put on and taking off regular lower body clothing?: A Lot 6 Click Score: 15   End of Session    Activity Tolerance: Patient tolerated treatment well Patient left: in bed;with call bell/phone within reach;with family/visitor present;Other (comment) (bed in chair position)  OT Visit Diagnosis: Unsteadiness on feet (R26.81);Muscle weakness (generalized) (M62.81);Pain Pain - part of body:  (back)                Time: 4383-8184 OT Time Calculation (min): 28 min Charges:  OT General Charges $OT Visit: 1 Visit OT Evaluation $OT Eval Moderate Complexity: 1 Mod OT Treatments $Self Care/Home Management : 8-22 mins  Limmie Patricia, OTR/L,CBIS  Supplemental OT - [facility] and [facility] Secure Chat Preferred    Zamyah Wiesman, Charisse March 09/22/2022, 3:50 PM

## 2022-09-22 NOTE — Progress Notes (Signed)
Patient ID: Hayley Wu, female   DOB: 1951-04-30, 72 y.o.   MRN: [id number] She looks good on postop day 1.  She does not have much in the way of postoperative pain.  She is moving her legs significantly better than preop.  She seems to have good hip flexion now.  Good knee extension.  Poor dorsiflexion on the right and limited dorsiflexion on the left but fairly good toe extension and good plantarflexion.  Mobilize with therapy  Appreciate medical oncology input

## 2022-09-22 NOTE — Progress Notes (Signed)
PROGRESS NOTE    Hayley Wu  GDJ:[id number] DOB: [birth year] DOA: 09/20/2022 PCP: [name], PA-C     Brief Narrative:   H/o HTN, hypothyroidism, tobacco abuse presenting for acute right leg weakness and inability to walk or lift right leg  CT C/A/P: Spiculated right lower lobe pulmonary nodules measures 17 x 15 mm, suspicious for primary bronchogenic malignancy. There is also a spiculated right upper lobe nodule measuring 8 mm. This may be metastatic or second primary. 2. Multiple additional small pulmonary nodules in both lungs. 3. Enlarged right hilar and subcarinal lymph nodes, suspicious for metastatic disease. 4. Multiple hepatic lesions, suspicious for metastatic disease. There may be also additional hepatic cysts. 5. Osseous metastatic disease with thoracic and lumbar spinal lesions, better assessed on MRI earlier today. Possible lesion in the medial right acetabulum. 6. Cholelithiasis. Colonic diverticulosis without diverticulitis.   MRI thoracic spine: 1. Widespread osseous metastatic disease involving the thoracic spine as above. Associated pathologic compression fractures of T6, T7, T8, and T12. Fracture most severe at T7 where there is associated vertebral plana formation. Adjacent epidural extension as above with resultant severe spinal stenosis and cord compression at T7. Emergent neuro surgical consultation recommended.  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 Arbutus Ped who recommended brain MRI and radiation oncology consult (secure chat message sent to radiation oncology weekend coverage)   Acute urinary retention in and out cathx2, continue bladder scan ,. At risk of needing foley insertion, started on  flomax, increase activity if able   Hypokalemia Likely from hctz  Magnesium 1.9 Replaced and improved  Cholelithiasis Imagine shows CBD dilated measuring up to 50mm and tapering at level of ampulla LFTs wnl and abdominal exam benign   Hypertension On amlodipine 10mg , losartan 100mg  and hctz 12.5mg  daily at home She has not taken the hctz in past 4 days Continue losartan , hold Norvasc for now  Hypothyroidism She tells me her levels have been off. Due for recheck of labs in 5/15, Medication decreased recently on march 20th.    Tsh 0.179, reduce levothyroxine from to , recommend repeat tsh in 4-6 weeks, f/u with pcp  Tobacco abuse Down to 3 cigarettes/day  55 pack year history  Desires to quit, declines nicotine patch     .   I have Reviewed nursing notes, Vitals, pain scores, I/o's, Lab results and  imaging results since pt's last encounter, details please see discussion above  I ordered the following labs:  Unresulted Labs (From admission, onward)     Start     Ordered   09/22/22 0500  Basic metabolic panel  Daily,   R      09/21/22 0913  DVT prophylaxis: SCD's Start: 09/21/22 1340 SCDs Start: 09/20/22 2117   Code Status:   Code Status: Full Code  Family Communication: Family at bedside Disposition:   Dispo: The patient is from: Home               Anticipated d/c is to: Home with family, home health              Anticipated d/c date is: awaiting for brain mri result and rad onc consult ( secure chat message sent to rad on weekend on call Dr Basilio Cairo)  Antimicrobials:   Anti-infectives (From admission, onward)    Start     Dose/Rate Route Frequency Ordered Stop   09/21/22 1800  ceFAZolin (ANCEF) IVPB 2g/100 mL premix        2 g 200 mL/hr over 30 Minutes Intravenous Every 8 hours 09/21/22 1340 09/22/22 0234   09/21/22 1015  ceFAZolin (ANCEF) IVPB 2g/100 mL premix        2 g 200 mL/hr over 30 Minutes Intravenous  Once 09/21/22 1012 09/21/22 1050   09/21/22 1011  ceFAZolin (ANCEF) 2-4 GM/100ML-% IVPB       Note to Pharmacy: Crissie Sickles: cabinet override      09/21/22 1011 09/21/22 1056           Objective: Vitals:   09/22/22 0410 09/22/22 0828 09/22/22 1230 09/22/22 1609  BP: (!) 115/57 (!) 120/58 121/61 119/61  Pulse: 60 71 80 68  Resp: Temp: 98 F (36.7 C) 98.4 F (36.9 C) 97.8 F (36.6 C) 97.9 F (36.6 C)  TempSrc: Oral Oral Oral Oral  SpO2: 96% 98% 97% 95%  Weight:      Height:        Intake/Output Summary (Last 24 hours) at 09/22/2022 1700 Last data filed at 09/22/2022 1000 Gross per 24 hour  Intake 818.75 ml  Output 2000 ml  Net -1181.25 ml   Filed Weights   09/20/22 1150 09/21/22 0945  Weight: 72.6 kg 72.6 kg    Examination:  General exam: alert, awake, communicative,calm, NAD Respiratory system: Clear to auscultation. Respiratory effort normal. Cardiovascular system:  RRR.  Gastrointestinal system: Abdomen is nondistended, soft and nontender.  Normal bowel sounds heard. Central nervous system: Alert and oriented. Able to move legs more Extremities:  no edema Skin: No rashes, lesions or ulcers Psychiatry: Judgement and insight appear normal. Mood & affect appropriate.     Data Reviewed: I have personally reviewed  labs and visualized  imaging studies since the last encounter and  formulate the plan        Scheduled Meds:  celecoxib  200 mg Oral Q12H   dexamethasone  4 mg Oral Q8H   [START ON 09/23/2022] levothyroxine  100 mcg Oral Q0600   losartan  100 mg Oral QHS   mupirocin ointment  1 Application Nasal BID   senna  1 tablet Oral BID   sodium chloride flush  3 mL Intravenous Q12H   tamsulosin  0.4 mg Oral Daily   Continuous Infusions:  sodium chloride     methocarbamol (ROBAXIN) IV       LOS: 2 days   Time spent:  Albertine Grates, MD PhD FACP [facility]  Available via Epic secure chat 7am-7pm for nonurgent issues Please page for urgent issues To page the attending provider between 7A-7P or the covering provider during after hours 7P-7A, please log into the web site [email or web address] and access using universal [place]  password for that web site. If you do not have the password, please call the hospital operator.    09/22/2022, 5:00 PM

## 2022-09-22 NOTE — Evaluation (Signed)
Physical Therapy Evaluation Patient Details Name: Hayley Wu MRN: [id number] DOB: [birth year] Today's Date: 09/22/2022  History of Present Illness  72 yo female admitted 4/12 with RLE weakness and saddle paresthesia with back pain since Nov 23. MRI with pathologic compression fx T6-8 and T12 as well as L2-3 tumor. Ct with bil lung nodules. 4/13 T7 laminectomy and biopsy with probable metastatic lung CA. PMhx: HTN, hypothyroidism  Clinical Impression  Pt pleasant with Rt hip and dorsiflexion weakness, decreased left dorsiflexion and bil LE impaired sensation, limited by fatigue. Pt educated for back precautions and reports she plans to DC to sister's house who has available DME. Pt reports deficits in bladder control since admission, deficits in strength and sensation for 2 weeks PTA. Pt will benefit from acute therapy to maximize mobility, safety, strength and function to decrease burden of care.         Recommendations for follow up therapy are one component of a multi-disciplinary discharge planning process, led by the attending physician.  Recommendations may be updated based on patient status, additional functional criteria and insurance authorization.  Follow Up Recommendations       Assistance Recommended at Discharge Intermittent Supervision/Assistance  Patient can return home with the following  A little help with walking and/or transfers;A little help with bathing/dressing/bathroom;Assistance with cooking/housework;Assist for transportation    Equipment Recommendations None recommended by PT  Recommendations for Other Services  OT consult    Functional Status Assessment Patient has had a recent decline in their functional status and demonstrates the ability to make significant improvements in function in a reasonable and predictable amount of time.     Precautions / Restrictions Precautions Precautions: Fall;Back Precaution Booklet Issued: No Restrictions Weight Bearing  Restrictions: No      Mobility  Bed Mobility Overal bed mobility: Needs Assistance Bed Mobility: Rolling, Sidelying to Sit Rolling: Min guard Sidelying to sit: Min assist       General bed mobility comments: cues for sequence with use of rail and increased time    Transfers Overall transfer level: Needs assistance   Transfers: Sit to/from Stand Sit to Stand: Min guard           General transfer comment: cues for hand placement and safety particularly with backing fully to surface    Ambulation/Gait Ambulation/Gait assistance: Min guard Gait Distance (Feet): 15 Feet Assistive device: Rolling walker (2 wheels) Gait Pattern/deviations: Step-to pattern, Decreased dorsiflexion - right, Decreased stance time - right, Decreased step length - left   Gait velocity interpretation: <1.8 ft/sec, indicate of risk for recurrent falls   General Gait Details: pt with foot drop and limited hip flexion RLE with reliance on bil UE with cues for sequence, safety and awareness of foot placement. Limited by fatigue  Stairs            Wheelchair Mobility    Modified Rankin (Stroke Patients Only)       Balance Overall balance assessment: Needs assistance Sitting-balance support: No upper extremity supported, Feet supported Sitting balance-Leahy Scale: Fair     Standing balance support: Bilateral upper extremity supported, Reliant on assistive device for balance, During functional activity Standing balance-Leahy Scale: Poor Standing balance comment: RW in standing                             Pertinent Vitals/Pain Pain Assessment Pain Assessment: No/denies pain    Home Living Family/patient expects to be discharged to:: Private residence  Living Arrangements: Alone Available Help at Discharge: Family;Available 24 hours/day Type of Home: House Home Access: Ramped entrance       Home Layout: Two level;Able to live on main level with bedroom/bathroom Home  Equipment: Rolling Walker (2 wheels);Wheelchair - manual;Cane - single point;Shower seat Additional Comments: lives alone plans to go to sister's house    Prior Function Prior Level of Function : Independent/Modified Independent;Driving                     Hand Dominance        Extremity/Trunk Assessment   Upper Extremity Assessment Upper Extremity Assessment: Overall WFL for tasks assessed    Lower Extremity Assessment Lower Extremity Assessment: RLE deficits/detail;LLE deficits/detail RLE Deficits / Details: hip flexion 1/5, dorsiflexion 1/5, quad strength 3/5, hamstring 4/5, tingling sensation knee and distally LLE Deficits / Details: hip flexion 3/5, quad 4/5, hamstring 5/5, dorsiflexion 2/5, tingling knee and distally    Cervical / Trunk Assessment Cervical / Trunk Assessment: Back Surgery  Communication   Communication: No difficulties  Cognition Arousal/Alertness: Awake/alert Behavior During Therapy: WFL for tasks assessed/performed Overall Cognitive Status: Within Functional Limits for tasks assessed                                          General Comments      Exercises     Assessment/Plan    PT Assessment Patient needs continued PT services  PT Problem List Decreased strength;Decreased range of motion;Decreased coordination;Decreased cognition;Decreased activity tolerance;Decreased balance;Decreased mobility;Decreased knowledge of precautions       PT Treatment Interventions DME instruction;Therapeutic exercise;Gait training;Functional mobility training;Therapeutic activities;Patient/family education;Balance training    PT Goals (Current goals can be found in the Care Plan section)  Acute Rehab PT Goals Patient Stated Goal: return home and do puzzles PT Goal Formulation: With patient Time For Goal Achievement: 10/06/22 Potential to Achieve Goals: Fair    Frequency Min 1X/week     Co-evaluation               AM-PAC  PT "6 Clicks" Mobility  Outcome Measure Help needed turning from your back to your side while in a flat bed without using bedrails?: A Little Help needed moving from lying on your back to sitting on the side of a flat bed without using bedrails?: A Little Help needed moving to and from a bed to a chair (including a wheelchair)?: A Little Help needed standing up from a chair using your arms (e.g., wheelchair or bedside chair)?: A Little Help needed to walk in hospital room?: A Little Help needed climbing 3-5 steps with a railing? : A Lot 6 Click Score: 17    End of Session Equipment Utilized During Treatment: Gait belt Activity Tolerance: Patient tolerated treatment well Patient left: in chair;with call bell/phone within reach;with chair alarm set;with family/visitor present Nurse Communication: Mobility status;Precautions PT Visit Diagnosis: Other abnormalities of gait and mobility (R26.89);History of falling (Z91.81);Muscle weakness (generalized) (M62.81);Other symptoms and signs involving the nervous system (R29.898)    Time: 1610-9604 PT Time Calculation (min) (ACUTE ONLY): 29 min   Charges:   PT Evaluation $PT Eval Moderate Complexity: 1 Mod PT Treatments $Therapeutic Activity: 8-22 mins        Hayley Wu, PT Acute Rehabilitation Services Office: [phone]   Hayley Wu 09/22/2022, 11:55 AM

## 2022-09-23 ENCOUNTER — Other Ambulatory Visit: Payer: Self-pay | Admitting: Radiation Therapy

## 2022-09-23 DIAGNOSIS — G9529 Other cord compression: Secondary | ICD-10-CM

## 2022-09-23 DIAGNOSIS — G9520 Unspecified cord compression: Secondary | ICD-10-CM | POA: Diagnosis not present

## 2022-09-23 DIAGNOSIS — D492 Neoplasm of unspecified behavior of bone, soft tissue, and skin: Secondary | ICD-10-CM

## 2022-09-23 DIAGNOSIS — Z720 Tobacco use: Secondary | ICD-10-CM

## 2022-09-23 DIAGNOSIS — E876 Hypokalemia: Secondary | ICD-10-CM | POA: Diagnosis not present

## 2022-09-23 LAB — BASIC METABOLIC PANEL
Anion gap: 7 (ref 5–15)
BUN: 15 mg/dL (ref 8–23)
CO2: 26 mmol/L (ref 22–32)
Calcium: 8.8 mg/dL — ABNORMAL LOW (ref 8.9–10.3)
Chloride: 105 mmol/L (ref 98–111)
Creatinine, Ser: 0.77 mg/dL (ref 0.44–1.00)
GFR, Estimated: >60 mL/min (ref >60)
Glucose, Bld: 128 mg/dL — ABNORMAL HIGH (ref 70–99)
Potassium: 4.6 mmol/L (ref 3.5–5.1)
Sodium: 138 mmol/L (ref 135–145)

## 2022-09-23 MED ORDER — CHLORHEXIDINE GLUCONATE CLOTH 2 % EX PADS
6.0000 | MEDICATED_PAD | Freq: Every day | CUTANEOUS | Status: DC
  Administered 2022-09-24 – 2022-09-25 (×2): 6 via TOPICAL

## 2022-09-23 NOTE — TOC Initial Note (Signed)
Transition of Care [facility]) - Initial/Assessment Note    Patient Details  Name: Hayley Wu MRN: [id number] Date of Birth: 1950/09/08  Transition of Care [facility]) CM/SW Contact:    Harriet Masson, RN Phone Number: 09/23/2022, 4:05 PM  Clinical Narrative:                 Spoke to patient and sister, [name], at bedside regarding transition needs. Patient is agreeable to Home health and sister has used [facility] in the past and [facility] is patient's first choice.  Amy with [facility] accepted referral.  Patient has BSC, walker and wheelchair.  Patient will be staying with sister, [name]. Address below: [address] [place] [address] [place], [place] [address]  [facility] will continue to follow for needs.   Expected Discharge Plan: Home w Home Health Services Barriers to Discharge: Continued Medical Work up   Patient Goals and CMS Choice Patient states their goals for this hospitalization and ongoing recovery are:: return home CMS Medicare.gov Compare Post Acute Care list provided to:: Patient Choice offered to / list presented to : Patient      Expected Discharge Plan and Services   Discharge Planning Services: CM Consult Post Acute Care Choice: Home Health Living arrangements for the past 2 months: Single Family Home                           HH Arranged: PT, OT HH Agency: [facility] Date [facility] Agency Contacted: 09/23/22 Time HH Agency Contacted: 1605 Representative spoke with at [facility] Agency: Amy  Prior Living Arrangements/Services Living arrangements for the past 2 months: Single Family Home Lives with:: Self Patient language and need for interpreter reviewed:: Yes Do you feel safe going back to the place where you live?: Yes      Need for Family Participation in Patient Care: Yes (Comment) Care giver support system in place?: Yes (comment) Current home services: DME (BSC, walker, WC) Criminal Activity/Legal Involvement Pertinent to Current Situation/Hospitalization: No -  Comment as needed  Activities of Daily Living Home Assistive Devices/Equipment: Environmental consultant (specify type), Cane (specify quad or straight) ADL Screening (condition at time of admission) Patient's cognitive ability adequate to safely complete daily activities?: Yes Is the patient deaf or have difficulty hearing?: No Does the patient have difficulty seeing, even when wearing glasses/contacts?: No Does the patient have difficulty concentrating, remembering, or making decisions?: No Patient able to express need for assistance with ADLs?: Yes Does the patient have difficulty dressing or bathing?: Yes Independently performs ADLs?: No Communication: Independent Dressing (OT): Needs assistance Is this a change from baseline?: Pre-admission baseline Grooming: Independent Feeding: Independent Bathing: Needs assistance Is this a change from baseline?: Pre-admission baseline Toileting: Needs assistance Is this a change from baseline?: Pre-admission baseline In/Out Bed: Needs assistance Is this a change from baseline?: Pre-admission baseline Walks in Home: Needs assistance Is this a change from baseline?: Pre-admission baseline Does the patient have difficulty walking or climbing stairs?: Yes Weakness of Legs: Both Weakness of Arms/Hands: None  Permission Sought/Granted                  Emotional Assessment Appearance:: Appears stated age Attitude/Demeanor/Rapport: Gracious Affect (typically observed): Accepting Orientation: : Oriented to Self, Oriented to Place, Oriented to  Time, Oriented to Situation Alcohol / Substance Use: Not Applicable Psych Involvement: No (comment)  Admission diagnosis:  Spinal cord compression [G95.20] Lung mass [R91.8] Cord compression [G95.20] Metastatic cancer to spine [C79.51] Weakness of both  lower extremities [R29.898] Thoracic spine tumor [D49.2] Patient Active Problem List   Diagnosis Date Noted   Thoracic spine tumor 09/21/2022   Tobacco abuse  09/20/2022   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 09/20/2022   Hypokalemia 09/20/2022   Lung mass 09/20/2022   Hypothyroidism 09/20/2022   Cholelithiasis 09/20/2022   Hypertension 09/23/2011   PCP:  [name], PA-C Pharmacy:   [id number] [place], [place] - [address] AT [facility] [address] [place] [address] Phone: [phone] Fax: [phone]  [facility] [address] [place], [place] - [address] [place] [address] Phone: [phone] Fax: [phone]     Social Determinants of Health (SDOH) Social History: SDOH Screenings   Food Insecurity: No Food Insecurity (09/21/2022)  Housing: Low Risk  (09/21/2022)  Transportation Needs: No Transportation Needs (09/21/2022)  Utilities: Not At Risk (09/21/2022)  Tobacco Use: High Risk (09/22/2022)   SDOH Interventions:     Readmission Risk Interventions     No data to display

## 2022-09-23 NOTE — Progress Notes (Signed)
Mobility Specialist: Progress Note   09/23/22 1514  Mobility  Activity Ambulated with assistance in room  Level of Assistance Minimal assist, patient does 75% or more  Assistive Device Front wheel walker  Distance Ambulated (ft) 24 ft  Activity Response Tolerated well  Mobility Referral Yes  $Mobility charge 1 Mobility   Pre-Mobility: 96 HR, 98% SpO2 Post-Mobility: 90 HR, 94% SpO2  Pt received in the chair and agreeable to mobility. Mod I to stand and minA during ambulation to assist with weight shift and to advance RLE. C/o general fatigue throughout. Pt back to bed per request after session with call bell in her lap. Bed alarm is on.   Analucia Hush Mobility Specialist Please contact via SecureChat or Rehab office at [phone]

## 2022-09-23 NOTE — Assessment & Plan Note (Signed)
Sp Decompressive thoracic laminectomy and medial facetectomy T7 with spinal cord decompression and biopsy of epidural mass.   Back pain is controlled with analgesics.  Continue to have difficulty ambulating but improving.  She is using analgesics prior to working with PT.  Plan to continue post op care, and follow up with neurosurgery recommendations.

## 2022-09-23 NOTE — Progress Notes (Signed)
Patient ID: Hayley Wu, female   DOB: 20-Nov-1950, 72 y.o.   MRN: [id number] She is doing well on postop day 2.  Not much in the way of pain.  Her leg strength is stable.  She states she walked to the door and back yesterday.  Now has antigravity strength in both hip flexors, wiggles her toes well.  Poor dorsiflexion on the right almost antigravity dorsiflexion on the left.  Knee extension.  Continue PT and OT.  Will need radiation and medical oncology.  Already seen by medical oncology.  Radiation oncology plans to see her as an outpatient I believe.

## 2022-09-23 NOTE — Progress Notes (Signed)
Physical Therapy Treatment Patient Details Name: Hayley Wu MRN: [id number] DOB: 01-Jun-1951 Today's Date: 09/23/2022   History of Present Illness 72 yo female admitted 4/12 with RLE weakness and saddle paresthesia with back pain since Nov 23. MRI with pathologic compression fx T6-8 and T12 as well as L2-3 tumor. Ct with bil lung nodules. 4/13 T7 laminectomy and biopsy with probable metastatic lung CA. PMhx: HTN, hypothyroidism    PT Comments    Pt received in supine, agreeable to therapy session and with good participation and tolerance for transfer and gait training after premedication by RN. Pt needing up to minA for transfers with dense cues for safe body mechanics and chair follow for safety with gait as pt quick to fatigue. Pt needed to be pushed back into room post-exertion in wheeled recliner. Pt with continued R foot drop, MD notified she would benefit from AFO order from outside vendor to reduce fall risk as pt with decreased R dorsiflexion and hip flexion strength and tending to shuffle RLE. Daughter present and encouraging, receptive to caregiver instruction. Pt continues to benefit from PT services to progress toward functional mobility goals.    Recommendations for follow up therapy are one component of a multi-disciplinary discharge planning process, led by the attending physician.  Recommendations may be updated based on patient status, additional functional criteria and insurance authorization.  Follow Up Recommendations       Assistance Recommended at Discharge Intermittent Supervision/Assistance  Patient can return home with the following A little help with walking and/or transfers;A little help with bathing/dressing/bathroom;Assistance with cooking/housework;Assist for transportation   Equipment Recommendations  None recommended by PT    Recommendations for Other Services       Precautions / Restrictions Precautions Precautions: Fall;Back Precaution Booklet  Issued: Yes (comment) Precaution Comments: Verbal education provided regarding back precautions with handout provided. Restrictions Weight Bearing Restrictions: No     Mobility  Bed Mobility Overal bed mobility: Needs Assistance Bed Mobility: Rolling, Sidelying to Sit, Sit to Sidelying Rolling: Min guard Sidelying to sit: Min guard       General bed mobility comments: VC for sequencing and hand placement. Required assist to bring trunk off of bed from sidelying and to advance BLE over EOB.    Transfers Overall transfer level: Needs assistance Equipment used: Rolling walker (2 wheels) Transfers: Sit to/from Stand Sit to Stand: Min guard           General transfer comment: cues for hand placement and safety particularly with backing fully to surface    Ambulation/Gait Ambulation/Gait assistance: Min guard Gait Distance (Feet): 30 Feet Assistive device: Rolling walker (2 wheels) Gait Pattern/deviations: Step-to pattern, Decreased dorsiflexion - right, Decreased stance time - right, Decreased step length - left, Shuffle (decreased R hip flexion)       General Gait Details: Pt with foot drop RLE and limited hip flexion RLE with reliance on bil UE with cues for sequence, safety and awareness of foot placement. distance limited due to pt fatigue and daughter provided close chair follow for safety.   Stairs             Wheelchair Mobility    Modified Rankin (Stroke Patients Only)       Balance Overall balance assessment: Needs assistance Sitting-balance support: Single extremity supported, Feet supported Sitting balance-Leahy Scale: Fair Sitting balance - Comments: sitting EOB. Required at least one UE to provide support during sitting, posterior lean when unsupported   Standing balance support: Bilateral upper extremity supported,  Reliant on assistive device for balance, During functional activity Standing balance-Leahy Scale: Poor Standing balance comment:  RW in standing                            Cognition Arousal/Alertness: Awake/alert Behavior During Therapy: WFL for tasks assessed/performed Overall Cognitive Status: Within Functional Limits for tasks assessed                                          Exercises General Exercises - Lower Extremity Ankle Circles/Pumps: AROM, Both, 10 reps, Supine, AAROM (AA on RLE) Quad Sets: AROM, Both, 10 reps, Supine Gluteal Sets: AROM, 5 reps, Supine Long Arc Quad: AROM, Both, 10 reps, Seated Hip ABduction/ADduction: AAROM, Both, 10 reps, Supine    General Comments General comments (skin integrity, edema, etc.): HR to 125 bpm with exertion      Pertinent Vitals/Pain Pain Assessment Pain Assessment: Faces Faces Pain Scale: Hurts little more Pain Location: back during mobility Pain Descriptors / Indicators: Grimacing, Guarding Pain Intervention(s): Monitored during session, Repositioned, Premedicated before session     PT Goals (current goals can now be found in the care plan section) Acute Rehab PT Goals Patient Stated Goal: return home and do puzzles PT Goal Formulation: With patient Time For Goal Achievement: 10/06/22 Progress towards PT goals: Progressing toward goals    Frequency    Min 1X/week      PT Plan Current plan remains appropriate       AM-PAC PT "6 Clicks" Mobility   Outcome Measure  Help needed turning from your back to your side while in a flat bed without using bedrails?: A Little Help needed moving from lying on your back to sitting on the side of a flat bed without using bedrails?: A Little Help needed moving to and from a bed to a chair (including a wheelchair)?: A Little Help needed standing up from a chair using your arms (e.g., wheelchair or bedside chair)?: A Little Help needed to walk in hospital room?: A Lot (chair follow) Help needed climbing 3-5 steps with a railing? : Total (not yet safe to attempt) 6 Click Score:  15    End of Session Equipment Utilized During Treatment: Gait belt Activity Tolerance: Patient tolerated treatment well Patient left: in chair;with call bell/phone within reach;with chair alarm set;with family/visitor present (daughter present) Nurse Communication: Mobility status;Precautions;Patient requests pain meds PT Visit Diagnosis: Other abnormalities of gait and mobility (R26.89);History of falling (Z91.81);Muscle weakness (generalized) (M62.81);Other symptoms and signs involving the nervous system (R29.898)     Time: 5701-7793 PT Time Calculation (min) (ACUTE ONLY): 39 min  Charges:  $Gait Training: 8-22 mins $Therapeutic Exercise: 8-22 mins $Therapeutic Activity: 8-22 mins                     Wynette Jersey P., PTA Acute Rehabilitation Services Secure Chat Preferred 9a-5:30pm Office: [phone]    Dorathy Kinsman [facility] 09/23/2022, 3:21 PM

## 2022-09-23 NOTE — Care Management Important Message (Signed)
Important Message  Patient Details  Name: Hayley Wu MRN: [id number] Date of Birth: 10/27/1950   Medicare Important Message Given:  Yes     Renie Ora 09/23/2022, 10:44 AM

## 2022-09-23 NOTE — Progress Notes (Signed)
Progress Note   Patient: Hayley Wu IZT:[id number] DOB: Jun 20, 1950 DOA: 09/20/2022     3 DOS: the patient was seen and examined on 09/23/2022   Brief hospital course: Mrs. Pedraza was admitted to the hospital with the working diagnosis of cord compression from widespread metastatic disease and pathological fractures of thoracic spine, most severe T7.   72 yo female with the past medical history of hypertension, hypothyroid and tobacco abuse who presented with acute right lower extremity weakness, not able to ambulate or lift right leg. Reported about 2 months of progressive lower extremity weakness and difficulty ambulating. Over last 24 hrs she had acute worsening on the right that prompted her to come to the ED. On her initial physical examination her blood pressure was 138/72, HR 82, RR 18 and 02 saturation 96%, lungs with no wheezing or rales, heart with S1 and S2 present and rhythmic, abdomen with no distention and no lower extremity edema.   CT chest/ abdomen and pelvis with spiculated right lower lobe pulmonary nodule measuring 17x15 mm, suspicious for primary bronchogenic malignancy. Spiculated right upper lobe nodule measuring 8 mm. \ Multiple additional small pulmonary nodules bilaterally. Enlarged right hilar and subcarinal lymph nodes, suspicious for metastatic disease.  Multiple hepatic lesions, suspicious for metastatic disease.  Osseous metastatic disease with thoracic and lumbar spinal lesions.   Thoracic spine MRI with highly suspicious osseous metastatic disease at T11, T12 and L2. Mild compression deformity on the superior T12 endplate.  Pathologic compression fractures of T6, T7, T8, and T12. Fracture most severe at T7 where there is associated vertebral plana formation. Adjacent epidural extension as above with resultant severe spinal stenosis and cord compression at T7.   Brain MRI negative for metastatic lesions.   Neurosurgery was consulted.  04/13 :  Decompressive thoracic laminectomy and medial facetectomy T7 with spinal cord decompression and biopsy of epidural mass  Oncology has been consulted.     Assessment and Plan: * 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 Sp Decompressive thoracic laminectomy and medial facetectomy T7 with spinal cord decompression and biopsy of epidural mass.   Back pain is controlled with analgesics.  Continue to have difficulty ambulating but improving.  She is using analgesics prior to working with PT.  Plan to continue post op care, and follow up with neurosurgery recommendations.   Lung mass Likely primary lung carcinoma.  Her brain MRI negative for metastasis Follow up with Oncology and radiation oncology.    Hypokalemia Renal function stable, serum cr is 0,77, K is 4,6 and serum bicarbonate at 26, Plan to continue close follow up on renal function and electrolytes.   Cholelithiasis Follow up as outpatient.   Hypertension Continue blood pressure control with losartan.  Hypothyroidism Continue with levothyroxine.   Tobacco abuse Down to 3 cigarettes/day  55 pack year history  Smoking cessation counseling.         Subjective: Patient with improvement in back pain, continue to have difficulty ambulating and using analgesics prior to working with PT  Physical Exam: Vitals:   09/22/22 2310 09/23/22 0316 09/23/22 0846 09/23/22 1115  BP: 116/77 (!) 119/59 136/73 128/71  Pulse: 74 69 86 76  Resp: 16 17 19 18   Temp: 98.7 F (37.1 C) 98.5 F (36.9 C) 98.4 F (36.9 C) 98.3 F (36.8 C)  TempSrc: Oral Oral Oral Oral  SpO2: 93% 99% 99% 95%  Weight:      Height:  Neurology awake and alert ENT With mild pallor Cardiovascular with S1 and S2 present and rhythmic with no gallops, rubs or murmurs Respiratory with no rales or wheezing Abdomen with no distention  No lower extremity edema  Data  Reviewed:    Family Communication: I spoke with patient's daughter at the bedside, we talked in detail about patient's condition, plan of care and prognosis and all questions were addressed.   Disposition: Status is: Inpatient Remains inpatient appropriate because: post op care   Planned Discharge Destination:  to be determined      Author: Coralie Keens, MD 09/23/2022 4:50 PM  For on call review [email or web address].

## 2022-09-23 NOTE — Hospital Course (Signed)
Hayley Wu was admitted to the hospital with the working diagnosis of cord compression from widespread metastatic disease and pathological fractures of thoracic spine, most severe T7.   72 yo female with the past medical history of hypertension, hypothyroid and tobacco abuse who presented with acute right lower extremity weakness, not able to ambulate or lift right leg. Reported about 2 months of progressive lower extremity weakness and difficulty ambulating. Over last 24 hrs she had acute worsening on the right that prompted her to come to the ED. On her initial physical examination her blood pressure was 138/72, HR 82, RR 18 and 02 saturation 96%, lungs with no wheezing or rales, heart with S1 and S2 present and rhythmic, abdomen with no distention and no lower extremity edema.   CT chest/ abdomen and pelvis with spiculated right lower lobe pulmonary nodule measuring 17x15 mm, suspicious for primary bronchogenic malignancy. Spiculated right upper lobe nodule measuring 8 mm. \ Multiple additional small pulmonary nodules bilaterally. Enlarged right hilar and subcarinal lymph nodes, suspicious for metastatic disease.  Multiple hepatic lesions, suspicious for metastatic disease.  Osseous metastatic disease with thoracic and lumbar spinal lesions.   Thoracic spine MRI with highly suspicious osseous metastatic disease at T11, T12 and L2. Mild compression deformity on the superior T12 endplate.  Pathologic compression fractures of T6, T7, T8, and T12. Fracture most severe at T7 where there is associated vertebral plana formation. Adjacent epidural extension as above with resultant severe spinal stenosis and cord compression at T7.   Brain MRI negative for metastatic lesions.   Neurosurgery was consulted.  04/13 : Decompressive thoracic laminectomy and medial facetectomy T7 with spinal cord decompression and biopsy of epidural mass  Oncology has been consulted.   04/16 foley catheter removed and  discontinued telemetry monitoring.

## 2022-09-23 NOTE — Progress Notes (Signed)
[name] provided advance care directive information to patient and her daughter. Patient will be completing the paperwork and will let staff know if/when the documents are ready for notary.   Hayley Wu

## 2022-09-24 ENCOUNTER — Other Ambulatory Visit (HOSPITAL_COMMUNITY): Payer: Self-pay

## 2022-09-24 DIAGNOSIS — G9520 Unspecified cord compression: Secondary | ICD-10-CM | POA: Diagnosis not present

## 2022-09-24 DIAGNOSIS — E876 Hypokalemia: Secondary | ICD-10-CM | POA: Diagnosis not present

## 2022-09-24 DIAGNOSIS — R918 Other nonspecific abnormal finding of lung field: Secondary | ICD-10-CM | POA: Diagnosis not present

## 2022-09-24 NOTE — Progress Notes (Signed)
Subjective: Patient reports doing well  Objective: Vital signs in last 24 hours: Temp:  [98 F (36.7 C)-98.4 F (36.9 C)] 98.3 F (36.8 C) (04/16 0732) Pulse Rate:  [67-86] 73 (04/16 0732) Resp:  [14-19] 14 (04/16 0732) BP: (128-153)/(63-75) 153/73 (04/16 0732) SpO2:  [92 %-99 %] 95 % (04/16 0732)  Intake/Output from previous day: 04/15 0701 - 04/16 0700 In: 600 [P.O.:600] Out: 3050 [Urine:3050] Intake/Output this shift: Total I/O In: -  Out: 600 [Urine:600]  Neurologic: Grossly normal, strength improving, still right foot drop  Lab Results: Lab Results  Component Value Date   WBC 9.3 09/22/2022   HGB 13.5 09/22/2022   HCT 39.3 09/22/2022   MCV 85.2 09/22/2022   PLT 200 09/22/2022   Lab Results  Component Value Date   INR 1.0 09/20/2022   BMET Lab Results  Component Value Date   NA 138 09/23/2022   K 4.6 09/23/2022   CL 105 09/23/2022   CO2 26 09/23/2022   GLUCOSE 128 (H) 09/23/2022   BUN 15 09/23/2022   CREATININE 0.77 09/23/2022   CALCIUM 8.8 (L) 09/23/2022    Studies/Results: MR BRAIN WO CONTRAST  Result Date: 09/22/2022 CLINICAL DATA:  Initial evaluation for metastatic disease. EXAM: MRI HEAD WITHOUT CONTRAST TECHNIQUE: Multiplanar, multiecho pulse sequences of the brain and surrounding structures were obtained without intravenous contrast. COMPARISON:  Comparison made with prior studies from 09/20/2022 and earlier. FINDINGS: Brain: Cerebral volume within normal limits for age. Scattered patchy T2/FLAIR hyperintensity involving the periventricular, deep, and subcortical white matter both cerebral hemispheres, nonspecific, but most commonly related to chronic microvascular ischemic disease. Overall, changes are moderately advanced in nature. No evidence for acute or subacute ischemia. Gray-white matter differentiation maintained. No areas of chronic cortical infarction. No acute intracranial hemorrhage. Few punctate chronic micro hemorrhages noted about the  cerebellum and left cerebral hemisphere, likely small vessel related. No mass lesion, midline shift or mass effect. No hydrocephalus or extra-axial fluid collection. Pituitary gland and suprasellar region within normal limits. No evidence for intracranial metastatic disease on this noncontrast examination. Vascular: Major intracranial vascular flow voids are well maintained. Skull and upper cervical spine: Craniocervical junction within normal limits. Bone marrow signal intensity normal. No visible focal marrow replacing lesion. No scalp soft tissue abnormality. Sinuses/Orbits: Globes and orbital soft tissues within normal limits. Paranasal sinuses are largely clear. Small bilateral mastoid effusions noted, of doubtful significance. Visualized nasopharynx unremarkable. Other: None. IMPRESSION: 1. No acute intracranial abnormality. No evidence for intracranial metastatic disease on this noncontrast examination. 2. Moderately advanced chronic microvascular ischemic disease. Electronically Signed   By: Rise Mu M.D.   On: 09/22/2022 20:43    Assessment/Plan: S/p thoracic decompression for resection of mass. Will order brace for right foot drop.    LOS: 4 days    Hayley Wu 09/24/2022, 8:09 AM

## 2022-09-24 NOTE — Progress Notes (Signed)
Orthopedic Tech Progress Note Patient Details:  Hayley Wu 1951/04/25 [id number]  Called in order to [employer] for an AFO BRACE   Patient ID: Hayley Wu, female   DOB: 02/15/1951, 72 y.o.   MRN: [id number]  [name] 09/24/2022, 8:20 AM

## 2022-09-24 NOTE — Progress Notes (Addendum)
Progress Note   Patient: CARRELL PALMATIER ZOX:[id number] DOB: 1950-06-28 DOA: 09/20/2022     4 DOS: the patient was seen and examined on 09/24/2022   Brief hospital course: Mrs. Long was admitted to the hospital with the working diagnosis of cord compression from widespread metastatic disease and pathological fractures of thoracic spine, most severe T7.   72 yo female with the past medical history of hypertension, hypothyroid and tobacco abuse who presented with acute right lower extremity weakness, not able to ambulate or lift right leg. Reported about 2 months of progressive lower extremity weakness and difficulty ambulating. Over last 24 hrs she had acute worsening on the right that prompted her to come to the ED. On her initial physical examination her blood pressure was 138/72, HR 82, RR 18 and 02 saturation 96%, lungs with no wheezing or rales, heart with S1 and S2 present and rhythmic, abdomen with no distention and no lower extremity edema.   CT chest/ abdomen and pelvis with spiculated right lower lobe pulmonary nodule measuring 17x15 mm, suspicious for primary bronchogenic malignancy. Spiculated right upper lobe nodule measuring 8 mm. \ Multiple additional small pulmonary nodules bilaterally. Enlarged right hilar and subcarinal lymph nodes, suspicious for metastatic disease.  Multiple hepatic lesions, suspicious for metastatic disease.  Osseous metastatic disease with thoracic and lumbar spinal lesions.   Thoracic spine MRI with highly suspicious osseous metastatic disease at T11, T12 and L2. Mild compression deformity on the superior T12 endplate.  Pathologic compression fractures of T6, T7, T8, and T12. Fracture most severe at T7 where there is associated vertebral plana formation. Adjacent epidural extension as above with resultant severe spinal stenosis and cord compression at T7.   Brain MRI negative for metastatic lesions.   Neurosurgery was consulted.  04/13 :  Decompressive thoracic laminectomy and medial facetectomy T7 with spinal cord decompression and biopsy of epidural mass  Oncology has been consulted.     Assessment and Plan: * 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 Sp Decompressive thoracic laminectomy and medial facetectomy T7 with spinal cord decompression and biopsy of epidural mass.   Back pain is controlled with analgesics.  Continue to have difficulty ambulating but improving.  She is using analgesics prior to working with PT.  Plan to continue post op care, and follow up with neurosurgery recommendations.   Lung mass Likely primary lung carcinoma.  Her brain MRI negative for metastasis Follow up with Oncology and radiation oncology.    Hypokalemia Renal function stable and electrolytes have been corrected. Patient is tolerating po well.   Cholelithiasis Follow up as outpatient.   Hypertension Continue blood pressure control with losartan.  Hypothyroidism Continue with levothyroxine.   Tobacco abuse Down to 3 cigarettes/day  55 pack year history  Smoking cessation counseling.         Subjective: Patient is out of bed to chair, and working with physical therapy, foley catheter has been removed. Patient with no chest pain or dyspnea and back pain is controlled with analgesics.   Physical Exam: Vitals:   09/24/22 0317 09/24/22 0732 09/24/22 1205 09/24/22 1502  BP: 131/73 (!) 153/73 (!) 151/77 132/82  Pulse: 67 73 67 89  Resp: Temp: 98.4 F (36.9 C) 98.3 F (36.8 C) 97.6 F (36.4 C) 98.4 F (36.9 C)  TempSrc: Oral Oral Oral Oral  SpO2: 97% 95% 98%   Weight:      Height:  Neurology awake and alert ENT with mild pallor Cardiovascular with S1 and S2 present and rhythmic with no gallops.  Respiratory with no rales or wheezing Abdomen with no distention  No lower extremity edema  Data Reviewed:   Family  Communication: no family at the bedside   Disposition: Status is: Inpatient Remains inpatient appropriate because: pending final recommendations from neurosurgery   Planned Discharge Destination: Home  Author: Coralie Keens, MD 09/24/2022 3:55 PM  For on call review [email or web address].

## 2022-09-24 NOTE — Progress Notes (Signed)
Physical Therapy Treatment Patient Details Name: Hayley Wu MRN: [id number] DOB: [birth year] Today's Date: 09/24/2022   History of Present Illness 72 yo female admitted 4/12 with RLE weakness and saddle paresthesia with back pain since Nov 23. MRI with pathologic compression fx T6-8 and T12 as well as L2-3 tumor. Ct with bil lung nodules. 4/13 T7 laminectomy and biopsy with probable metastatic lung CA. PMhx: HTN, hypothyroidism    PT Comments    AFO ordered this morning had not arrived by time of PT session. PT utilized Ace wrap to facilitate dorsiflexion of R LE with gait. After performing seated LE exercise, pt able to ambulate in hallway with close chair follow. Pt able to progress gait distance and sequencing with ambulation today with one time seated rest break. Pt reports that she does not feel confident about going home with her sister until her ambulation is a little stronger. PT will follow back for AFO training tomorrow.     Recommendations for follow up therapy are one component of a multi-disciplinary discharge planning process, led by the attending physician.  Recommendations may be updated based on patient status, additional functional criteria and insurance authorization.     Assistance Recommended at Discharge Intermittent Supervision/Assistance  Patient can return home with the following A little help with walking and/or transfers;A little help with bathing/dressing/bathroom;Assistance with cooking/housework;Assist for transportation   Equipment Recommendations  None recommended by PT       Precautions / Restrictions Precautions Precautions: Fall;Back Precaution Booklet Issued: Yes (comment) Precaution Comments: Verbal education provided regarding back precautions with handout provided. Restrictions Weight Bearing Restrictions: No     Mobility  Bed Mobility               General bed mobility comments: sitting up in recliner on entry     Transfers Overall transfer level: Needs assistance Equipment used: Rolling walker (2 wheels) Transfers: Sit to/from Stand Sit to Stand: Min guard           General transfer comment: cues for hand placement and safety particularly with backing fully to surface, as well as eccentric control with descent into chair    Ambulation/Gait Ambulation/Gait assistance: Min guard Gait Distance (Feet): 20 Feet (x2) Assistive device: Rolling walker (2 wheels) Gait Pattern/deviations: Decreased dorsiflexion - right, Decreased stance time - right, Decreased step length - left, Shuffle, Step-through pattern (decreased R hip flexion) Gait velocity: slows with fatigue Gait velocity interpretation: <1.8 ft/sec, indicate of risk for recurrent falls   General Gait Details: AFO had not arrived in time for PT session, PT utilzed Ace wrap to make toe lift for ambulation, with increased concentration pt able to perform enough hip flexion to advance R foot, pt with increased fatigue at about 20 feet ambulation and starts to slide foot and circumduct R hip, asked pt to take seated rest break. Pt able to perform second bout of ambulation with similar results, however with cues for proper proximity to RW and use of triceps for increased support with R LE weight bearing able to improve R LE advancement         Balance Overall balance assessment: Needs assistance Sitting-balance support: Single extremity supported, Feet supported Sitting balance-Leahy Scale: Fair Sitting balance - Comments: sitting EOB. Required at least one UE to provide support during sitting, posterior lean when unsupported   Standing balance support: Bilateral upper extremity supported, Reliant on assistive device for balance, During functional activity Standing balance-Leahy Scale: Poor Standing balance comment: RW in standing  Cognition Arousal/Alertness: Awake/alert Behavior During Therapy: WFL  for tasks assessed/performed Overall Cognitive Status: Within Functional Limits for tasks assessed                                          Exercises General Exercises - Lower Extremity Long Arc Quad: AROM, Both, 10 reps, Seated Hip Flexion/Marching: AROM, Both, 5 reps, Seated    General Comments  Pt family in room during session. VSS on RA       Pertinent Vitals/Pain Pain Assessment Pain Assessment: Faces Faces Pain Scale: Hurts little more Pain Location: back during mobility Pain Descriptors / Indicators: Grimacing, Guarding Pain Intervention(s): Limited activity within patient's tolerance, Monitored during session, Repositioned     PT Goals (current goals can now be found in the care plan section) Acute Rehab PT Goals Patient Stated Goal: return home and do puzzles PT Goal Formulation: With patient Time For Goal Achievement: 10/06/22 Potential to Achieve Goals: Fair Progress towards PT goals: Progressing toward goals    Frequency    Min 1X/week      PT Plan Current plan remains appropriate       AM-PAC PT "6 Clicks" Mobility   Outcome Measure  Help needed turning from your back to your side while in a flat bed without using bedrails?: A Little Help needed moving from lying on your back to sitting on the side of a flat bed without using bedrails?: A Little Help needed moving to and from a bed to a chair (including a wheelchair)?: A Little Help needed standing up from a chair using your arms (e.g., wheelchair or bedside chair)?: A Little Help needed to walk in hospital room?: A Lot (chair follow) Help needed climbing 3-5 steps with a railing? : Total (not yet safe to attempt) 6 Click Score: 15    End of Session Equipment Utilized During Treatment: Gait belt Activity Tolerance: Patient tolerated treatment well Patient left: in chair;with call bell/phone within reach;with chair alarm set;with family/visitor present (sister and brother  present) Nurse Communication: Mobility status PT Visit Diagnosis: Other abnormalities of gait and mobility (R26.89);History of falling (Z91.81);Muscle weakness (generalized) (M62.81);Other symptoms and signs involving the nervous system (Z61.096)     Time: 0454-0981 PT Time Calculation (min) (ACUTE ONLY): 41 min  Charges:  $Gait Training: 23-37 mins $Therapeutic Exercise: 8-22 mins                     Jamille Yoshino B. Beverely Risen PT, DPT Acute Rehabilitation Services Please use secure chat or  Call Office [phone]    Elon Alas [facility] 09/24/2022, 4:33 PM

## 2022-09-24 NOTE — Anesthesia Postprocedure Evaluation (Signed)
Anesthesia Post Note  Patient: Hayley Wu  Procedure(s) Performed: THORACIC LAMINECTOMY FOR TUMOR (Back)     Patient location during evaluation: PACU Anesthesia Type: General Level of consciousness: awake and alert Pain management: pain level controlled Vital Signs Assessment: post-procedure vital signs reviewed and stable Respiratory status: spontaneous breathing, nonlabored ventilation, respiratory function stable and patient connected to nasal cannula oxygen Cardiovascular status: blood pressure returned to baseline and stable Postop Assessment: no apparent nausea or vomiting Anesthetic complications: no   No notable events documented.  Last Vitals:  Vitals:   09/24/22 0317 09/24/22 0732  BP: 131/73 (!) 153/73  Pulse: 67 73  Resp: 16 14  Temp: 36.9 C 36.8 C  SpO2: 97% 95%    Last Pain:  Vitals:   09/24/22 0732  TempSrc: Oral  PainSc:                  Thelda Gagan S

## 2022-09-24 NOTE — Progress Notes (Signed)
Mobility Specialist: Progress Note   09/24/22 1700  Mobility  Activity Ambulated with assistance in room  Level of Assistance Contact guard assist, steadying assist  Assistive Device Front wheel walker  Distance Ambulated (ft) 24 ft  Activity Response Tolerated well  Mobility Referral Yes  $Mobility charge 1 Mobility   Received pt in chair having no complaints and agreeable to mobility. Pt was asymptomatic throughout ambulation and returned to room w/o fault. Left in bed w/ call bell in reach and all needs met.  Alexzandria Massman Mobility Specialist Please contact via SecureChat or Rehab office at [phone]

## 2022-09-25 ENCOUNTER — Telehealth: Payer: Self-pay | Admitting: Radiation Therapy

## 2022-09-25 DIAGNOSIS — C7951 Secondary malignant neoplasm of bone: Secondary | ICD-10-CM

## 2022-09-25 DIAGNOSIS — G9520 Unspecified cord compression: Secondary | ICD-10-CM | POA: Diagnosis not present

## 2022-09-25 DIAGNOSIS — R918 Other nonspecific abnormal finding of lung field: Secondary | ICD-10-CM | POA: Diagnosis not present

## 2022-09-25 MED ORDER — AMLODIPINE BESYLATE 10 MG PO TABS
10.0000 mg | ORAL_TABLET | Freq: Every day | ORAL | Status: DC
  Administered 2022-09-25 – 2022-09-27 (×3): 10 mg via ORAL
  Filled 2022-09-25 (×3): qty 1

## 2022-09-25 MED ORDER — DEXAMETHASONE 2 MG PO TABS
2.0000 mg | ORAL_TABLET | Freq: Three times a day (TID) | ORAL | Status: DC
  Administered 2022-09-25 – 2022-09-26 (×3): 2 mg via ORAL
  Filled 2022-09-25 (×4): qty 1

## 2022-09-25 MED ORDER — POLYETHYLENE GLYCOL 3350 17 G PO PACK
17.0000 g | PACK | Freq: Two times a day (BID) | ORAL | Status: AC
  Administered 2022-09-25 (×2): 17 g via ORAL
  Filled 2022-09-25 (×4): qty 1

## 2022-09-25 NOTE — Telephone Encounter (Signed)
Hayley Wu will be discharged on Friday 4/19. I have scheduled her for a consult with Dr. Basilio Cairo and post op spine simulation on Friday 4/26. She has my contact information in case she has other questions or concerns.   Jalene Mullet R.T>(R)(T) Radiation Special Procedures Navigator

## 2022-09-25 NOTE — Progress Notes (Signed)
PT Cancellation Note  Patient Details Name: Hayley Wu MRN: [id number] DOB: April 20, 1951   Cancelled Treatment:    Reason Eval/Treat Not Completed: (P) Other (comment) [employer] to bring AFO today and daughter can not bring shoes until 3:30 today. PT will follow back then to perform gait training with AFO.  Amos Micheals B. Beverely Risen PT, DPT Acute Rehabilitation Services Please use secure chat or  Call Office [phone]    Elon Alas [facility] 09/25/2022, 8:07 AM

## 2022-09-25 NOTE — Progress Notes (Addendum)
[facility] PROGRESS NOTE    Progress Note  Hayley Wu  LKG:[id number] DOB: 09/11/1950 DOA: 09/20/2022 PCP: [name], PA-C     Brief Narrative:   Hayley Wu is an 72 y.o. female past medical history of essential hypertension, hypothyroidism tobacco abuse comes in with acute right lower extremity weakness and inability to ambulate that started about 2 months ago with progressive difficulty ambulating but over the last 24 hours prior to admission prompted him to come to the ED.  CT scan of the abdomen pelvis and chest showed a spiculated right lower lobe nodule measuring about 17 x 15 mm concerning for bronchogenic carcinoma and multiple additional small pulmonary nodules bilaterally and enlarged right hilar and subcarinal lymph nodes suspicious for metastatic disease with multiple hepatic lesions and osseous metastatic thoracic and lumbar spine lesions.  Thoracic MRI highly suspicious for metastatic disease and T11, T12 and L2 with mild compression fraction of T12 and pathogenic compression fracture of T6 and T7 which is the most severe 1.  MRI of the brain was negative for metastatic lesion admitted for working diagnosis of cord compression due to wide metastatic disease and pathologic fracture 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 Continue Conex for analgesics as he is having difficulty ambulating. PT has been consulted, recommended home health PT. Neurosurgery recommended brace of right foot for foot drop. Awaiting PT OT evaluation with a brace for the foot drop and new shoes from home.  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 Will follow-up with oncology and radiation oncology as an  outpatient.  Hypokalemia Replete orally now resolved.  Cholelithiasis Follow-up with PCP as an outpatient.  Essential hypertension: Blood pressure relatively well-controlled continue losartan.  Hypothyroidism: Continue Synthroid. Recheck in 4 to 6 weeks TSH and free T4.  Tobacco abuse: Down to 3 cigarettes a day. Patient nicotine patch.   DVT prophylaxis: lovenox Family Communication:none Status is: Inpatient Remains inpatient appropriate because: Lung mass with metastatic lesions    Code Status:     Code Status Orders  (From admission, onward)           Start     Ordered   09/20/22 2117  Full code  Continuous       Question:  By:  Answer:  Consent: discussion documented in EHR   09/20/22 2117           Code Status History     Date Active Date Inactive Code Status Order ID Comments User Context   11/26/2013 1051 11/26/2013 1538 Full Code [id number]  Allena Katz, PA-C Inpatient         IV Access:   Peripheral IV   Procedures and diagnostic studies:   No results found.   Medical Consultants:   None.   Subjective:    Hayley Wu has not had a bowel movement.  Objective:    Vitals:   09/24/22 1205 09/24/22 1502 09/24/22 2033 09/25/22 0745  BP: (!) 151/77 132/82 126/78 (!) 141/64  Pulse: 67 89 79 78  Resp: Temp: 97.6 F (36.4 C) 98.4 F (36.9 C) 98.3 F (36.8 C) 98.4 F (36.9 C)  TempSrc: Oral Oral Oral Oral  SpO2: 98%  94% 92%  Weight:  Height:       SpO2: 92 % O2 Flow Rate (L/min): 1 L/min   Intake/Output Summary (Last 24 hours) at 09/25/2022 1022 Last data filed at 09/25/2022 0745 Gross per 24 hour  Intake 480 ml  Output 1400 ml  Net -920 ml   Filed Weights   09/20/22 1150 09/21/22 0945  Weight: 72.6 kg 72.6 kg    Exam: General exam: In no acute distress. Respiratory system: Good air movement and clear to auscultation. Cardiovascular system: S1 & S2 heard, RRR. No  JVD. Gastrointestinal system: Abdomen is nondistended, soft and nontender.  Extremities: No pedal edema. Skin: No rashes, lesions or ulcers Psychiatry: Judgement and insight appear normal. Mood & affect appropriate.    Data Reviewed:    Labs: Basic Metabolic Panel: Recent Labs  Lab 09/20/22 1225 09/20/22 2201 09/22/22 0145 09/23/22 0151  NA 140  --  137 138  K 3.2*  --  3.7 4.6  CL 105  --  107 105  CO2 26  --  24 26  GLUCOSE 100*  --  133* 128*  BUN 13  --  21 15  CREATININE 0.68  --  0.74 0.77  CALCIUM 9.3  --  8.6* 8.8*  MG  --  1.9  --   --    GFR Estimated Creatinine Clearance: 62.5 mL/min (by C-G formula based on SCr of 0.77 mg/dL). Liver Function Tests: Recent Labs  Lab 09/20/22 1507  AST 19  ALT 19  ALKPHOS 124  BILITOT 0.7  PROT 7.0  ALBUMIN 4.3   No results for input(s): "LIPASE", "AMYLASE" in the last 168 hours. No results for input(s): "AMMONIA" in the last 168 hours. Coagulation profile Recent Labs  Lab 09/20/22 2201  INR 1.0   COVID-19 Labs  No results for input(s): "DDIMER", "FERRITIN", "LDH", "CRP" in the last 72 hours.  No results found for: "SARSCOV2NAA"  CBC: Recent Labs  Lab 09/20/22 1225 09/22/22 0145  WBC 6.9 9.3  NEUTROABS 5.0  --   HGB 15.4* 13.5  HCT 46.3* 39.3  MCV 86.7 85.2  PLT 227 200   Cardiac Enzymes: No results for input(s): "CKTOTAL", "CKMB", "CKMBINDEX", "TROPONINI" in the last 168 hours. BNP (last 3 results) No results for input(s): "PROBNP" in the last 8760 hours. CBG: No results for input(s): "GLUCAP" in the last 168 hours. D-Dimer: No results for input(s): "DDIMER" in the last 72 hours. Hgb A1c: No results for input(s): "HGBA1C" in the last 72 hours. Lipid Profile: No results for input(s): "CHOL", "HDL", "LDLCALC", "TRIG", "CHOLHDL", "LDLDIRECT" in the last 72 hours. Thyroid function studies: No results for input(s): "TSH", "T4TOTAL", "T3FREE", "THYROIDAB" in the last 72 hours.  Invalid input(s):  "FREET3" Anemia work up: No results for input(s): "VITAMINB12", "FOLATE", "FERRITIN", "TIBC", "IRON", "RETICCTPCT" in the last 72 hours. Sepsis Labs: Recent Labs  Lab 09/20/22 1225 09/22/22 0145  WBC 6.9 9.3   Microbiology Recent Results (from the past 240 hour(s))  Surgical PCR screen     Status: Abnormal   Collection Time: 09/21/22  9:32 AM   Specimen: Nasal Mucosa; Nasal Swab  Result Value Ref Range Status   MRSA, PCR NEGATIVE NEGATIVE Final   Staphylococcus aureus POSITIVE (A) NEGATIVE Final    Comment: RESULT CALLED TO, READ BACK BY AND VERIFIED WITH: 09/21/2022 AT 1125 RN AMY K. , ADC (NOTE) The Xpert SA Assay (FDA approved for NASAL specimens in patients 36 years of age and older), is one component of a comprehensive surveillance program. It is  not intended to diagnose infection nor to guide or monitor treatment. Performed at [facility], [address] [place], [place] [address]      Medications:    celecoxib  200 mg Oral Q12H   Chlorhexidine Gluconate Cloth  6 each Topical Daily   dexamethasone  4 mg Oral Q8H   levothyroxine  100 mcg Oral Q0600   losartan  100 mg Oral QHS   mupirocin ointment  1 Application Nasal BID   senna  1 tablet Oral BID   sodium chloride flush  3 mL Intravenous Q12H   tamsulosin  0.4 mg Oral Daily   Continuous Infusions:  sodium chloride     methocarbamol (ROBAXIN) IV        LOS: 5 days   Marinda Elk  [facility]  09/25/2022, 10:22 AM

## 2022-09-25 NOTE — Progress Notes (Signed)
Patient ID: Hayley Wu, female   DOB: [birth year], 72 y.o.   MRN: [id number] She looks good, neuro stable, AFO in room, urinating ok no foley replaced  PT/OT Home on Friday with HH and f/u with onc/ rads onc

## 2022-09-25 NOTE — Progress Notes (Signed)
Physical Therapy Treatment Patient Details Name: Hayley Wu MRN: [id number] DOB: 1950/11/11 Today's Date: 09/25/2022   History of Present Illness 72 yo female admitted 4/12 with RLE weakness and saddle paresthesia with back pain since Nov 23. MRI with pathologic compression fx T6-8 and T12 as well as L2-3 tumor. Ct with bil lung nodules. 4/13 T7 laminectomy and biopsy with probable metastatic lung CA. PMhx: HTN, hypothyroidism    PT Comments    AFO arrived and daughter brought running shoes.  After shoes donned pt stood and immediately needed to urinate. BSC brought out, and pt had decreased safety with transfer due to fear of incontinency. Discussed wearing briefs at discharge so that she can maintain safety knowing she will not make a mess at her sister's home. Afterwards pt able to ambulate in hallway with AFO. Initially gait was better with increased R foot clearance, however with fatigue of R hip flexors pt with decreased ability to clear foot with increased foot drag. Discussed need for seated rest breaks to recover. In long run pt will be able to go further if she takes appropriate breaks. PT will work on standing rest breaks tomorrow.     Recommendations for follow up therapy are one component of a multi-disciplinary discharge planning process, led by the attending physician.  Recommendations may be updated based on patient status, additional functional criteria and insurance authorization.     Assistance Recommended at Discharge Intermittent Supervision/Assistance  Patient can return home with the following A little help with walking and/or transfers;A little help with bathing/dressing/bathroom;Assistance with cooking/housework;Assist for transportation   Equipment Recommendations  None recommended by PT       Precautions / Restrictions Precautions Precautions: Fall;Back Precaution Booklet Issued: Yes (comment) Precaution Comments: pt able to recall 3/3 precautions, needs vc  for maintaining Restrictions Weight Bearing Restrictions: No     Mobility  Bed Mobility Overal bed mobility: Needs Assistance Bed Mobility: Rolling, Sidelying to Sit, Sit to Sidelying Rolling: Supervision Sidelying to sit: Supervision            Transfers Overall transfer level: Needs assistance Equipment used: Rolling walker (2 wheels) Transfers: Sit to/from Stand Sit to Stand: Min guard           General transfer comment: cue for hand placement and kicking R LE out infront of her with the AFO donned    Ambulation/Gait Ambulation/Gait assistance: Min assist Gait Distance (Feet): 30 Feet (x2) Assistive device: Rolling walker (2 wheels) Gait Pattern/deviations: Decreased dorsiflexion - right, Decreased stance time - right, Decreased step length - left, Shuffle, Step-through pattern (decreased R hip flexion) Gait velocity: slows with fatigue Gait velocity interpretation: <1.8 ft/sec, indicate of risk for recurrent falls   General Gait Details: improved initial gait with AFO as R LE fatigues increased difficulty with enough hip and knee flexion to advance R LE, discussed need to rest in order to progress distance       Balance Overall balance assessment: Needs assistance Sitting-balance support: Single extremity supported, Feet supported Sitting balance-Leahy Scale: Fair Sitting balance - Comments: sitting EOB. Required at least one UE to provide support during sitting, posterior lean when unsupported   Standing balance support: Bilateral upper extremity supported, Reliant on assistive device for balance, During functional activity Standing balance-Leahy Scale: Poor Standing balance comment: RW in standing                            Cognition Arousal/Alertness: Awake/alert Behavior  During Therapy: WFL for tasks assessed/performed Overall Cognitive Status: Within Functional Limits for tasks assessed                                              General Comments General comments (skin integrity, edema, etc.): R AFO placed in running shoes and assisted pt with donning,      Pertinent Vitals/Pain Pain Assessment Pain Assessment: 0-10 Faces Pain Scale: Hurts little more Pain Location: back after mobility Pain Descriptors / Indicators: Grimacing, Guarding Pain Intervention(s): Limited activity within patient's tolerance, Monitored during session, Repositioned     PT Goals (current goals can now be found in the care plan section) Acute Rehab PT Goals Patient Stated Goal: return home and do puzzles PT Goal Formulation: With patient Time For Goal Achievement: 10/06/22 Potential to Achieve Goals: Fair Progress towards PT goals: Progressing toward goals    Frequency    Min 1X/week      PT Plan Current plan remains appropriate       AM-PAC PT "6 Clicks" Mobility   Outcome Measure  Help needed turning from your back to your side while in a flat bed without using bedrails?: A Little Help needed moving from lying on your back to sitting on the side of a flat bed without using bedrails?: A Little Help needed moving to and from a bed to a chair (including a wheelchair)?: A Little Help needed standing up from a chair using your arms (e.g., wheelchair or bedside chair)?: A Little Help needed to walk in hospital room?: A Lot (chair follow) Help needed climbing 3-5 steps with a railing? : Total (not yet safe to attempt) 6 Click Score: 15    End of Session Equipment Utilized During Treatment: Gait belt Activity Tolerance: Patient tolerated treatment well Patient left: in chair;with call bell/phone within reach;with chair alarm set;with family/visitor present (sister and brother present) Nurse Communication: Mobility status PT Visit Diagnosis: Other abnormalities of gait and mobility (R26.89);History of falling (Z91.81);Muscle weakness (generalized) (M62.81);Other symptoms and signs involving the nervous system  (R29.898)     Time: 1610-1706 PT Time Calculation (min) (ACUTE ONLY): 56 min  Charges:  $Gait Training: 38-52 mins $Self Care/Home Management: 8-22                     Wilborn Membreno B. Beverely Risen PT, DPT Acute Rehabilitation Services Please use secure chat or  Call Office [phone]    Elon Alas [facility] 09/25/2022, 5:23 PM

## 2022-09-25 NOTE — Progress Notes (Signed)
Occupational Therapy Treatment Patient Details Name: Hayley Wu MRN: [id number] DOB: 11/13/1950 Today's Date: 09/25/2022   History of present illness 72 yo female admitted 4/12 with RLE weakness and saddle paresthesia with back pain since Nov 23. MRI with pathologic compression fx T6-8 and T12 as well as L2-3 tumor. Ct with bil lung nodules. 4/13 T7 laminectomy and biopsy with probable metastatic lung CA. PMhx: HTN, hypothyroidism   OT comments  Pt making good progress with functional goals. Continue to await AFO and daughter bringing in shoes this afternoon. Pt agreeable for OOB activity to walk to bathroom for toileting tasks. Reviewed back precautions and compensator techniques and A/E for LB selfcare. OT will continue to follow acutely to maximize level of function and safety   Recommendations for follow up therapy are one component of a multi-disciplinary discharge planning process, led by the attending physician.  Recommendations may be updated based on patient status, additional functional criteria and insurance authorization.    Assistance Recommended at Discharge Frequent or constant Supervision/Assistance  Patient can return home with the following  A little help with walking and/or transfers;A little help with bathing/dressing/bathroom;Help with stairs or ramp for entrance;Assist for transportation;Assistance with cooking/housework   Equipment Recommendations  Other (comment) (LH bath sponge, reacher)    Recommendations for Other Services      Precautions / Restrictions Precautions Precautions: Fall;Back Precaution Comments: reviewed back precautions Restrictions Weight Bearing Restrictions: No       Mobility Bed Mobility Overal bed mobility: Needs Assistance Bed Mobility: Rolling, Sidelying to Sit, Sit to Sidelying Rolling: Supervision Sidelying to sit: Supervision            Transfers Overall transfer level: Needs assistance Equipment used: Rolling  walker (2 wheels) Transfers: Sit to/from Stand Sit to Stand: Min guard                 Balance Overall balance assessment: Needs assistance Sitting-balance support: Single extremity supported, Feet supported Sitting balance-Leahy Scale: Fair     Standing balance support: Bilateral upper extremity supported, Reliant on assistive device for balance, During functional activity Standing balance-Leahy Scale: Poor                             ADL either performed or assessed with clinical judgement   ADL Overall ADL's : Needs assistance/impaired     Grooming: Wash/dry hands;Wash/dry face;Min guard;Standing       Lower Body Bathing: Moderate assistance;Sitting/lateral leans;Cueing for back precautions;Cueing for compensatory techniques Lower Body Bathing Details (indicate cue type and reason): simulated seated EOB, initiated LH bath sponge education Upper Body Dressing : Minimal assistance;Cueing for sequencing;Sitting;Cueing for compensatory techniques;Min guard       Toilet Transfer: Ambulation;Min guard;Rolling walker (2 wheels);Regular Toilet;Grab bars   Toileting- Clothing Manipulation and Hygiene: Moderate assistance;Sit to/from stand       Functional mobility during ADLs: Minimal assistance;Min guard;Rolling walker (2 wheels);Cueing for safety      Extremity/Trunk Assessment Upper Extremity Assessment Upper Extremity Assessment: Overall WFL for tasks assessed   Lower Extremity Assessment Lower Extremity Assessment: Defer to PT evaluation   Cervical / Trunk Assessment Cervical / Trunk Assessment: Back Surgery    Vision Baseline Vision/History: 1 Wears glasses Ability to See in Adequate Light: 0 Adequate Patient Visual Report: No change from baseline     Perception     Praxis      Cognition Arousal/Alertness: Awake/alert Behavior During Therapy: WFL for tasks assessed/performed Overall  Cognitive Status: Within Functional Limits for tasks  assessed                                          Exercises      Shoulder Instructions       General Comments      Pertinent Vitals/ Pain       Pain Assessment Pain Assessment: 0-10 Pain Score: 2  Pain Location: back aching during mobility Pain Descriptors / Indicators: Grimacing, Guarding Pain Intervention(s): Limited activity within patient's tolerance, Monitored during session, Repositioned  Home Living                                          Prior Functioning/Environment              Frequency  Min 1X/week        Progress Toward Goals  OT Goals(current goals can now be found in the care plan section)  Progress towards OT goals: Progressing toward goals     Plan Discharge plan remains appropriate;Frequency remains appropriate    Co-evaluation                 AM-PAC OT "6 Clicks" Daily Activity     Outcome Measure   Help from another person eating meals?: None Help from another person taking care of personal grooming?: A Little Help from another person toileting, which includes using toliet, bedpan, or urinal?: A Lot Help from another person bathing (including washing, rinsing, drying)?: A Lot Help from another person to put on and taking off regular upper body clothing?: A Little Help from another person to put on and taking off regular lower body clothing?: A Lot 6 Click Score: 16    End of Session Equipment Utilized During Treatment: Gait belt;Rolling walker (2 wheels)  OT Visit Diagnosis: Unsteadiness on feet (R26.81);Muscle weakness (generalized) (M62.81);Pain Pain - part of body:  (back)   Activity Tolerance Patient tolerated treatment well   Patient Left with call bell/phone within reach;in chair   Nurse Communication          Time: 9528-4132 OT Time Calculation (min): 25 min  Charges: OT General Charges $OT Visit: 1 Visit OT Treatments $Self Care/Home Management : 8-22  mins $Therapeutic Activity: 8-22 mins    Galen Manila 09/25/2022, 12:53 PM

## 2022-09-26 DIAGNOSIS — G9520 Unspecified cord compression: Secondary | ICD-10-CM | POA: Diagnosis not present

## 2022-09-26 LAB — SURGICAL PATHOLOGY

## 2022-09-26 NOTE — Progress Notes (Signed)
[facility] PROGRESS NOTE    Progress Note  Hayley Wu  ZOX:[id number] DOB: 03/02/1951 DOA: 09/20/2022 PCP: [name], PA-C     Brief Narrative:   Hayley Wu is an 72 y.o. female past medical history of essential hypertension, hypothyroidism tobacco abuse comes in with acute right lower extremity weakness and inability to ambulate that started about 2 months ago with progressive difficulty ambulating but over the last 24 hours prior to admission prompted him to come to the ED.  CT scan of the abdomen pelvis and chest showed a spiculated right lower lobe nodule measuring about 17 x 15 mm concerning for bronchogenic carcinoma and multiple additional small pulmonary nodules bilaterally and enlarged right hilar and subcarinal lymph nodes suspicious for metastatic disease with multiple hepatic lesions and osseous metastatic thoracic and lumbar spine lesions.  Thoracic MRI highly suspicious for metastatic disease and T11, T12 and L2 with mild compression fraction of T12 and pathogenic compression fracture of T6 and T7 which is the most severe 1.  MRI of the brain was negative for metastatic lesion admitted for working diagnosis of cord compression due to wide metastatic disease and pathologic fracture 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 Continue narcotics for analgesics as he is having difficulty ambulating. PT has been consulted, recommended home health PT. Neurosurgery recommended brace of right foot for foot drop. PT evaluated the patient she will need home health PT.  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 Will follow-up with oncology and radiation oncology as an outpatient.  Hypokalemia Replete  orally now resolved.  Cholelithiasis Follow-up with PCP as an outpatient.  Essential hypertension: Blood pressure relatively well-controlled continue losartan.  Hypothyroidism: Continue Synthroid. Recheck in 4 to 6 weeks TSH and free T4.  Tobacco abuse: Down to 3 cigarettes a day. Patient nicotine patch.   DVT prophylaxis: lovenox Family Communication:none Status is: Inpatient Remains inpatient appropriate because: Lung mass with metastatic lesions    Code Status:     Code Status Orders  (From admission, onward)           Start     Ordered   09/20/22 2117  Full code  Continuous       Question:  By:  Answer:  Consent: discussion documented in EHR   09/20/22 2117           Code Status History     Date Active Date Inactive Code Status Order ID Comments User Context   11/26/2013 1051 11/26/2013 1538 Full Code [id number]  Allena Katz, PA-C Inpatient         IV Access:   Peripheral IV   Procedures and diagnostic studies:   No results found.   Medical Consultants:   None.   Subjective:    Hayley Wu had a bowel movement  Objective:    Vitals:   09/25/22 1616 09/25/22 2103 09/26/22 0307 09/26/22 0742  BP: (!) 158/83 (!) 169/79 (!) 140/58 135/66  Pulse: 78 69 64 68  Resp: 20 17 17 18   Temp: 98.4 F (36.9 C) 98.2 F (36.8 C) 98 F (36.7 C) 98.7 F (37.1 C)  TempSrc: Oral Oral Oral Oral  SpO2: 92% 95% 95% 94%  Weight:      Height:  SpO2: 94 % O2 Flow Rate (L/min): 1 L/min   Intake/Output Summary (Last 24 hours) at 09/26/2022 1113 Last data filed at 09/26/2022 1044 Gross per 24 hour  Intake 363 ml  Output --  Net 363 ml    Filed Weights   09/20/22 1150 09/21/22 0945  Weight: 72.6 kg 72.6 kg    Exam: General exam: In no acute distress. Respiratory system: Good air movement and clear to auscultation. Cardiovascular system: S1 & S2 heard, RRR. No JVD. Gastrointestinal system: Abdomen is nondistended, soft and  nontender.  Extremities: No pedal edema. Skin: No rashes, lesions or ulcers Psychiatry: Judgement and insight appear normal. Mood & affect appropriate.    Data Reviewed:    Labs: Basic Metabolic Panel: Recent Labs  Lab 09/20/22 1225 09/20/22 2201 09/22/22 0145 09/23/22 0151  NA 140  --  137 138  K 3.2*  --  3.7 4.6  CL 105  --  107 105  CO2 26  --  24 26  GLUCOSE 100*  --  133* 128*  BUN 13  --  21 15  CREATININE 0.68  --  0.74 0.77  CALCIUM 9.3  --  8.6* 8.8*  MG  --  1.9  --   --     GFR Estimated Creatinine Clearance: 62.5 mL/min (by C-G formula based on SCr of 0.77 mg/dL). Liver Function Tests: Recent Labs  Lab 09/20/22 1507  AST 19  ALT 19  ALKPHOS 124  BILITOT 0.7  PROT 7.0  ALBUMIN 4.3    No results for input(s): "LIPASE", "AMYLASE" in the last 168 hours. No results for input(s): "AMMONIA" in the last 168 hours. Coagulation profile Recent Labs  Lab 09/20/22 2201  INR 1.0    COVID-19 Labs  No results for input(s): "DDIMER", "FERRITIN", "LDH", "CRP" in the last 72 hours.  No results found for: "SARSCOV2NAA"  CBC: Recent Labs  Lab 09/20/22 1225 09/22/22 0145  WBC 6.9 9.3  NEUTROABS 5.0  --   HGB 15.4* 13.5  HCT 46.3* 39.3  MCV 86.7 85.2  PLT 227 200    Cardiac Enzymes: No results for input(s): "CKTOTAL", "CKMB", "CKMBINDEX", "TROPONINI" in the last 168 hours. BNP (last 3 results) No results for input(s): "PROBNP" in the last 8760 hours. CBG: No results for input(s): "GLUCAP" in the last 168 hours. D-Dimer: No results for input(s): "DDIMER" in the last 72 hours. Hgb A1c: No results for input(s): "HGBA1C" in the last 72 hours. Lipid Profile: No results for input(s): "CHOL", "HDL", "LDLCALC", "TRIG", "CHOLHDL", "LDLDIRECT" in the last 72 hours. Thyroid function studies: No results for input(s): "TSH", "T4TOTAL", "T3FREE", "THYROIDAB" in the last 72 hours.  Invalid input(s): "FREET3" Anemia work up: No results for input(s):  "VITAMINB12", "FOLATE", "FERRITIN", "TIBC", "IRON", "RETICCTPCT" in the last 72 hours. Sepsis Labs: Recent Labs  Lab 09/20/22 1225 09/22/22 0145  WBC 6.9 9.3    Microbiology Recent Results (from the past 240 hour(s))  Surgical PCR screen     Status: Abnormal   Collection Time: 09/21/22  9:32 AM   Specimen: Nasal Mucosa; Nasal Swab  Result Value Ref Range Status   MRSA, PCR NEGATIVE NEGATIVE Final   Staphylococcus aureus POSITIVE (A) NEGATIVE Final    Comment: RESULT CALLED TO, READ BACK BY AND VERIFIED WITH: 09/21/2022 AT 1125 RN AMY K. , ADC (NOTE) The Xpert SA Assay (FDA approved for NASAL specimens in patients 74 years of age and older), is one component of a comprehensive surveillance program. It is not intended  to diagnose infection nor to guide or monitor treatment. Performed at [facility], [address], [place], [place] [address]      Medications:    amLODipine  10 mg Oral Daily   celecoxib  200 mg Oral Q12H   Chlorhexidine Gluconate Cloth  6 each Topical Daily   levothyroxine  100 mcg Oral Q0600   losartan  100 mg Oral QHS   polyethylene glycol  17 g Oral BID   senna  1 tablet Oral BID   sodium chloride flush  3 mL Intravenous Q12H   tamsulosin  0.4 mg Oral Daily   Continuous Infusions:  sodium chloride     methocarbamol (ROBAXIN) IV        LOS: 6 days   Marinda Elk  [facility]  09/26/2022, 11:13 AM

## 2022-09-26 NOTE — TOC Progression Note (Signed)
Transition of Care (TOC) - Progression Note   Asked MD for home health orders and face to face.   Updated Amy with [id number]  Patient Details  Name: Hayley Wu MRN: [id number] Date of Birth: 03-10-1951  Transition of Care [facility]) CM/SW Contact  Kalianne Fetting, Adria Devon, RN Phone Number: 09/26/2022, 12:36 PM  Clinical Narrative:       Expected Discharge Plan: Home w Home Health Services Barriers to Discharge: Continued Medical Work up  Expected Discharge Plan and Services   Discharge Planning Services: CM Consult Post Acute Care Choice: Home Health Living arrangements for the past 2 months: Single Family Home                           HH Arranged: PT, OT [facility] Agency: [facility] Date [facility] Agency Contacted: 09/23/22 Time HH Agency Contacted: 1605 Representative spoke with at [facility] Agency: Amy   Social Determinants of Health (SDOH) Interventions SDOH Screenings   Food Insecurity: No Food Insecurity (09/21/2022)  Housing: Low Risk  (09/21/2022)  Transportation Needs: No Transportation Needs (09/21/2022)  Utilities: Not At Risk (09/21/2022)  Tobacco Use: High Risk (09/22/2022)    Readmission Risk Interventions     No data to display

## 2022-09-26 NOTE — Progress Notes (Signed)
Histology and Location of Primary Cancer: CT CHEST ABDOMEN PELVIS W CONTRAST 09-20-22 IMPRESSION: 1. Spiculated right lower lobe pulmonary nodules measures 17 x 15 mm, suspicious for primary bronchogenic malignancy. There is also a spiculated right upper lobe nodule measuring 8 mm. This may be metastatic or second primary. 2. Multiple additional small pulmonary nodules in both lungs. 3. Enlarged right hilar and subcarinal lymph nodes, suspicious for metastatic disease. 4. Multiple hepatic lesions, suspicious for metastatic disease. There may be also additional hepatic cysts. 5. Osseous metastatic disease with thoracic and lumbar spinal lesions, better assessed on MRI earlier today. Possible lesion in the medial right acetabulum. 6. Cholelithiasis. Colonic diverticulosis without diverticulitis.   09-21-22 Clinical History: thoracic tumor (cm) FINAL MICROSCOPIC DIAGNOSIS:   A. THORACIC TUMOR, RESECTION:  - Metastatic carcinoma  - See comment   B. THORACIC TUMOR, RESECTION:  - Metastatic carcinoma  - See comment   Location(s) of Symptomatic tumor(s): Spinal cord compression due to malignant neoplasm metastatic to spine   MRI of Thoracic spine on 09-20-22 IMPRESSION: 1. Widespread osseous metastatic disease involving the thoracic spine as above. Associated pathologic compression fractures of T6, T7, T8, and T12. Fracture most severe at T7 where there is associated vertebral plana formation. Adjacent epidural extension as above with resultant severe spinal stenosis and cord compression at T7. Emergent neuro surgical consultation recommended. 2. Few scattered probable pulmonary nodules within the partially visualized right lung, concerning for metastatic disease. 3. Possible cholelithiasis.   Critical Value/emergent results were called by telephone at the time of interpretation on 09/20/2022 at 7:13 pm to provider [facility] , who verbally acknowledged these results.  MRI of  Lumbar spine on 09-20-22 IMPRESSION: 1. 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 No appreciable epidural tumor within the spinal canal. Recommend oncologic workup. 2. Mild compression deformity of the superior T12 endplate with approximately 10% loss of vertebral body height and no bony retropulsion consistent with pathologic fracture, possibly acute. 3. Advanced bilateral facet arthropathy at L2-L3 through L5-S1 with moderate spinal canal stenosis and bilateral subarticular zone narrowing at L2-L3 through L4-L5 4. 3.6 cm gallstone. The common bile duct is dilated measuring up to 9 mm with tapering at the level of the ampulla. Correlate with LFTs and consider dedicated imaging as indicated. 5. 2.1 cm T2 hyperintense lesion in the left pelvis abutting the medial aspect of the psoas muscle is indeterminate and may reflect an ovarian cyst, lymphocele, or inclusion cyst. Recommend attention on follow-up imaging.   MRI of brain on 09-22-22 IMPRESSION: 1. No acute intracranial abnormality. No evidence for intracranial metastatic disease on this noncontrast examination. 2. Moderately advanced chronic microvascular ischemic disease.   Past/Anticipated chemotherapy by medical oncology, if any:  Dr. Arbutus Ped on 09-21-22 Hayley Wu is a 72 y.o. female with past medical history significant for hypertension as well as long history of smoking.  The patient mentioned that she has been complaining of back pain since November 2023.  She was seen by [facility] clinic few times in the past and she was treated with medication.  Her pain issues continues to get worse and she started having numbness in the lower extremities.  She presented to the emergency department for further evaluation.  She started with MRI of the lumbar spine without contrast yesterday  and that showed findings highly suspicious for osseous metastatic disease at T11, T12  and L2 with extraosseous tumor along the right aspect of the L2 vertebral body extending into the L2-L3 neural foramen.  There was no appreciable epidural tumor within the spinal canal.  MRI of the thoracic spine performed on 09/20/2022 showed widespread osseous metastatic disease involving the thoracic spine.  There was associated pathologic compression fracture of T6, T7, T8 and T12.  The fracture most severe at T7 where there is associated vertebral plana formation.  Adjacent epidural extension with resultant severe spinal stenosis and cord compression at T7.   Family history significant for several family members with malignancy including a daughter with thyroid cancer, mother had lung cancer, a sister had breast cancer, another sister had kidney cancer and her brother had prostate cancer.   The patient is a widow and her husband who was my patient died from lung cancer 17 years ago.  The patient has 3 children 2 daughters and 1 son.  She used to work in [employer].  She has a history of smoking for around 55 years and quit a week ago.  She has no history of alcohol or drug abuse. HPI  ASSESSMENT: This is a very pleasant 72 years old white female with highly suspicious stage IV (T1b, N2, M1 C) lung cancer pending tissue diagnosis and presented with right lower lobe lung nodule in addition to smaller bilateral pulmonary nodules as well as right hilar and mediastinal lymphadenopathy in addition to liver and bone metastasis diagnosed in April 2024.   PLAN: I had a lengthy discussion with the patient today about her current disease condition and further investigation to confirm her diagnosis as well as the staging workup. I recommended for the patient to have MRI of the brain hopefully tomorrow or the day after to rule out any brain metastasis. The patient will benefit from palliative radiotherapy to the metastatic disease in  the thoracic spine especially the area of T7 after decompression laminectomy. I will arrange for the patient to have a PET scan performed on outpatient basis. If the final pathology confirms the presence of non-small cell lung cancer, adenocarcinoma we will send the tissue for molecular studies and PD-L1 expression. For pain management the patient will continue on her current pain medication and she may benefit from physical therapy before discharge and also at home. I will arrange for the patient a follow-up appointment with me at [facility] after discharge for more detailed discussion of her treatment options based on the final pathology and staging workup. I provided the patient with my contact information and she knows to call when she gets out of the hospital for a follow-up visit.  Patient's main complaints related to symptomatic tumor(s) are:  (Per Dr. Asa Lente note) The patient mentioned that she has been complaining of back pain since November 2023.  She was seen by [facility] clinic few times in the past and she was treated with medication.  Her pain issues continues to get worse and she started having numbness in the lower extremities.  She presented to the emergency department for further evaluation.    Pain on a scale of 0-10 is: mild back pain 2/10, goes to 4 or 5/10 with movement, oxy and robaxin help to control pain  If Spine Met(s), symptoms, if any, include: Bowel/Bladder retention or incontinence (please describe): none to report Numbness or weakness in extremities (please describe): legs weakness, feels likes legs asleep for about two week Current Decadron regimen, if applicable: na  Ambulatory status? Walker? Wheelchair?: use  walker and wheelchair at baseline due to leg weakness/ numbness for last two weeks. She has been working with physical therapy  SAFETY ISSUES: Prior radiation? no Pacemaker/ICD? no Possible current pregnancy? no Is the patient on  methotrexate? no  Additional Complaints / other details:  Overall the pt had not major concerns or questions. Pt just wants to know the plan overall (number of treatments, etc). Rn also reminded pt that she was will have CT simulation on Friday as well.

## 2022-09-26 NOTE — Progress Notes (Signed)
Physical Therapy Treatment Patient Details Name: Hayley Wu MRN: [id number] DOB: 1950/10/07 Today's Date: 09/26/2022   History of Present Illness 72 yo female admitted 4/12 with RLE weakness and saddle paresthesia with back pain since Nov 23. MRI with pathologic compression fx T6-8 and T12 as well as L2-3 tumor. Ct with bil lung nodules. 4/13 T7 laminectomy and biopsy with probable metastatic lung CA. PMhx: HTN, hypothyroidism    PT Comments    Pt reports good night's rest and eager to work with therapy today. Focus of session on gait training to include visualizing foot placement, equalizing step length, recognizing fatigue and HEP at discharge. Pt is making good progress towards her goals and is tolerating AFO well. Educated on need for visualizing LE after AFO wear to look for possible "hot spots" before wounds occur as her sensation in her LE is also impaired. D/c plans remain appropriate.      Recommendations for follow up therapy are one component of a multi-disciplinary discharge planning process, led by the attending physician.  Recommendations may be updated based on patient status, additional functional criteria and insurance authorization.     Assistance Recommended at Discharge Intermittent Supervision/Assistance  Patient can return home with the following A little help with walking and/or transfers;A little help with bathing/dressing/bathroom;Assistance with cooking/housework;Assist for transportation   Equipment Recommendations  None recommended by PT       Precautions / Restrictions Precautions Precautions: Fall;Back Precaution Booklet Issued: Yes (comment) Precaution Comments: pt able to recall 3/3 precautions, needs vc for maintaining Restrictions Weight Bearing Restrictions: No     Mobility  Bed Mobility Overal bed mobility: Needs Assistance Bed Mobility: Rolling, Sidelying to Sit, Sit to Sidelying Rolling: Supervision Sidelying to sit: Supervision             Transfers Overall transfer level: Needs assistance Equipment used: Rolling walker (2 wheels) Transfers: Sit to/from Stand Sit to Stand: Min guard           General transfer comment: cue for hand placement and kicking R LE out infront of her with the AFO donned    Ambulation/Gait Ambulation/Gait assistance: Min assist Gait Distance (Feet): 60 Feet (+20) Assistive device: Rolling walker (2 wheels) Gait Pattern/deviations: Decreased dorsiflexion - right, Decreased stance time - right, Decreased step length - left, Shuffle, Step-through pattern (decreased R hip flexion) Gait velocity: slows with fatigue Gait velocity interpretation: <1.8 ft/sec, indicate of risk for recurrent falls   General Gait Details: focus on equalizing step length, watching foot alignment especially with fatigue, and working on safety       Balance Overall balance assessment: Needs assistance Sitting-balance support: Single extremity supported, Feet supported Sitting balance-Leahy Scale: Fair Sitting balance - Comments: sitting EOB. Required at least one UE to provide support during sitting, posterior lean when unsupported   Standing balance support: Bilateral upper extremity supported, Reliant on assistive device for balance, During functional activity Standing balance-Leahy Scale: Poor Standing balance comment: RW in standing                            Cognition Arousal/Alertness: Awake/alert Behavior During Therapy: WFL for tasks assessed/performed Overall Cognitive Status: Within Functional Limits for tasks assessed                                          Exercises General Exercises -  Lower Extremity Long Arc Quad: AROM, Both, 10 reps, Seated Hip Flexion/Marching: AROM, Both, 10 reps, Seated    General Comments General comments (skin integrity, edema, etc.): pt able to don socks and L shoe, assist with R shoe      Pertinent Vitals/Pain Pain  Assessment Pain Assessment: 0-10 Faces Pain Scale: Hurts little more Pain Location: back after mobility Pain Descriptors / Indicators: Grimacing, Guarding Pain Intervention(s): Limited activity within patient's tolerance, Monitored during session, Repositioned     PT Goals (current goals can now be found in the care plan section) Acute Rehab PT Goals Patient Stated Goal: return home and do puzzles PT Goal Formulation: With patient Time For Goal Achievement: 10/06/22 Potential to Achieve Goals: Fair Progress towards PT goals: Progressing toward goals    Frequency    Min 1X/week      PT Plan Current plan remains appropriate       AM-PAC PT "6 Clicks" Mobility   Outcome Measure  Help needed turning from your back to your side while in a flat bed without using bedrails?: A Little Help needed moving from lying on your back to sitting on the side of a flat bed without using bedrails?: A Little Help needed moving to and from a bed to a chair (including a wheelchair)?: A Little Help needed standing up from a chair using your arms (e.g., wheelchair or bedside chair)?: A Little Help needed to walk in hospital room?: A Lot (chair follow) Help needed climbing 3-5 steps with a railing? : Total (not yet safe to attempt) 6 Click Score: 15    End of Session Equipment Utilized During Treatment: Gait belt Activity Tolerance: Patient tolerated treatment well Patient left: in chair;with call bell/phone within reach;with chair alarm set;with family/visitor present (sister present) Nurse Communication: Mobility status PT Visit Diagnosis: Other abnormalities of gait and mobility (R26.89);History of falling (Z91.81);Muscle weakness (generalized) (M62.81);Other symptoms and signs involving the nervous system (Z61.096)     Time: 0454-0981 PT Time Calculation (min) (ACUTE ONLY): 30 min  Charges:  $Gait Training: 8-22 mins $Therapeutic Exercise: 8-22 mins                     Leanard Dimaio B.  Beverely Risen PT, DPT Acute Rehabilitation Services Please use secure chat or  Call Office [phone]    Elon Alas [facility] 09/26/2022, 12:17 PM

## 2022-09-26 NOTE — Plan of Care (Signed)
  Problem: Education: Goal: Knowledge of General Education information will improve Description: Including pain rating scale, medication(s)/side effects and non-pharmacologic comfort measures Outcome: Progressing   Problem: Activity: Goal: Risk for activity intolerance will decrease Outcome: Progressing   Problem: Nutrition: Goal: Adequate nutrition will be maintained Outcome: Progressing   Problem: Coping: Goal: Level of anxiety will decrease Outcome: Progressing   Problem: Elimination: Goal: Will not experience complications related to bowel motility Outcome: Progressing Goal: Will not experience complications related to urinary retention Outcome: Progressing   Problem: Pain Managment: Goal: General experience of comfort will improve Outcome: Progressing   Problem: Safety: Goal: Ability to remain free from injury will improve Outcome: Progressing   Problem: Skin Integrity: Goal: Risk for impaired skin integrity will decrease Outcome: Progressing   Problem: Education: Goal: Ability to verbalize activity precautions or restrictions will improve Outcome: Progressing   Problem: Education: Goal: Knowledge of the prescribed therapeutic regimen will improve Outcome: Progressing   Problem: Activity: Goal: Ability to tolerate increased activity will improve Outcome: Progressing   Problem: Activity: Goal: Will remain free from falls Outcome: Progressing   Problem: Pain Management: Goal: Pain level will decrease Outcome: Progressing   Problem: Skin Integrity: Goal: Will show signs of wound healing Outcome: Progressing

## 2022-09-26 NOTE — Progress Notes (Signed)
   09/26/22 1215  Spiritual Encounters  Type of Visit Follow up  Care provided to: Patient  Referral source Patient request  Reason for visit Advance directives  OnCall Visit No  Interventions  Spiritual Care Interventions Made Established relationship of care and support;Compassionate presence;Reflective listening  Intervention Outcomes  Outcomes Connection to spiritual care;Awareness of support  Advance Directives (For Healthcare)  Does Patient Have a Medical Advance Directive? No  Would patient like information on creating a medical advance directive? Yes (Inpatient - patient requests chaplain consult to create a medical advance directive)   Chaplain Tiburcio Pea responded to a page stating that the patient had completed her AD paperwork. The patient needed it to be notarized. Chaplain scheduled a notary and volunteers to complete the AD. Document has been completed, scanned and returned the original to the patient. A hard copy is in the patient's paper chart.   Note prepared by Arlyce Dice, Chaplain Resident [phone]

## 2022-09-26 NOTE — Progress Notes (Signed)
Patient ID: Hayley Wu, female   DOB: Jul 11, 1950, 72 y.o.   MRN: [id number] Subjective: Patient reports no pain, strength stable, walked 30 ft with PT in AFO  Objective: Vital signs in last 24 hours: Temp:  [98 F (36.7 C)-98.7 F (37.1 C)] 98.7 F (37.1 C) (04/18 0742) Pulse Rate:  [64-78] 68 (04/18 0742) Resp:  [17-20] 18 (04/18 0742) BP: (135-169)/(58-83) 135/66 (04/18 0742) SpO2:  [92 %-98 %] 94 % (04/18 0742)  Intake/Output from previous day: 04/17 0701 - 04/18 0700 In: 840 [P.O.:840] Out: -  Intake/Output this shift: No intake/output data recorded.  Neuro exam unchanged, steris in place, no signs of infection  Lab Results: Lab Results  Component Value Date   WBC 9.3 09/22/2022   HGB 13.5 09/22/2022   HCT 39.3 09/22/2022   MCV 85.2 09/22/2022   PLT 200 09/22/2022   Lab Results  Component Value Date   INR 1.0 09/20/2022   BMET Lab Results  Component Value Date   NA 138 09/23/2022   K 4.6 09/23/2022   CL 105 09/23/2022   CO2 26 09/23/2022   GLUCOSE 128 (H) 09/23/2022   BUN 15 09/23/2022   CREATININE 0.77 09/23/2022   CALCIUM 8.8 (L) 09/23/2022    Studies/Results: No results found.  Assessment/Plan: Doing well, neuro stable, cont PT/OT, d/c decadron, home tomorrow F/u with me 2 wks Rad onc appt arranged 4/26   Estimated body mass index is 28.34 kg/m as calculated from the following:   Height as of this encounter:  (1.6 m).   Weight as of this encounter: 72.6 kg.    LOS: 6 days    Tia Alert 09/26/2022, 8:15 AM

## 2022-09-27 ENCOUNTER — Telehealth: Payer: Self-pay | Admitting: Internal Medicine

## 2022-09-27 DIAGNOSIS — G9520 Unspecified cord compression: Secondary | ICD-10-CM | POA: Diagnosis not present

## 2022-09-27 MED ORDER — TAMSULOSIN HCL 0.4 MG PO CAPS: 0.4000 mg | ORAL_CAPSULE | Freq: Every day | ORAL | 0 refills | Status: DC

## 2022-09-27 MED ORDER — SENNA 8.6 MG PO TABS: 1.0000 | ORAL_TABLET | Freq: Two times a day (BID) | ORAL | 0 refills | Status: DC

## 2022-09-27 MED ORDER — POLYETHYLENE GLYCOL 3350 17 G PO PACK: 17.0000 g | PACK | Freq: Two times a day (BID) | ORAL | 0 refills | Status: DC

## 2022-09-27 MED ORDER — OXYCODONE HCL 5 MG PO TABS: 5.0000 mg | ORAL_TABLET | ORAL | 0 refills | Status: DC | PRN

## 2022-09-27 MED ORDER — CELECOXIB 200 MG PO CAPS: 200.0000 mg | ORAL_CAPSULE | Freq: Two times a day (BID) | ORAL | 0 refills | Status: AC

## 2022-09-27 MED ORDER — LEVOTHYROXINE SODIUM 100 MCG PO TABS: 100.0000 ug | ORAL_TABLET | Freq: Every day | ORAL | 0 refills | Status: DC

## 2022-09-27 MED ORDER — METHOCARBAMOL 500 MG PO TABS: 500.0000 mg | ORAL_TABLET | Freq: Four times a day (QID) | ORAL | 0 refills | Status: AC | PRN

## 2022-09-27 NOTE — Discharge Summary (Signed)
Physician Discharge Summary  Patient ID: Hayley Wu MRN: [id number] DOB/AGE: [birth year] 72 y.o.  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 Active Problems:   Hypertension   Tobacco abuse   Hypokalemia   Lung mass   Hypothyroidism   Cholelithiasis   Thoracic spine tumor   Discharged Condition: fair  Hospital Course: Patient is a 72 year old female with past medical history significant for hypertension, hypothyroidism and tobacco abuse.  Patient was admitted with acute right lower extremity weakness and inability to ambulate that started about 2 months ago with progressive difficulty ambulating.  CT scan of the abdomen pelvis and chest showed a spiculated right lower lobe nodule measuring about 17 x 15 mm concerning for bronchogenic carcinoma and multiple additional small pulmonary nodules bilaterally and enlarged right hilar and subcarinal lymph nodes suspicious for metastatic disease with multiple hepatic lesions and osseous metastatic thoracic and lumbar spine lesions.  Thoracic MRI highly suspicious for metastatic disease and T11, T12 and L2 with mild compression fraction of T12 and pathogenic compression fracture of T6 and T7 which is the most severe.  MRI of the brain was negative for metastatic lesion.  Patient was admitted with working diagnosis of cord compression due to wide metastatic disease and pathologic fracture 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 -Oncology and neurosurgery team assisted in directing patient's care. -Pain was controlled. -Physical Therapy team assisted in managing  patient. -Patient will be discharged back home with home health PT. -Patient plans to move in with her older sister.   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 -Patient was seen by Dr. Shirline Frees, local oncologist.  Patient will follow-up with the oncology team.  Patient may benefit from palliative radiation.     Hypokalemia: -Resolved. -Last documented potassium was 4.6.   Cholelithiasis Follow-up with PCP as an outpatient.   Essential hypertension: Blood pressure relatively well-controlled continue losartan. Goal blood pressure should be less than 130/80 mmHg.   Hypothyroidism: Continue Synthroid. Recheck TSH and free T4 in 4 to 6 weeks.   Tobacco abuse: Patient is Down to 3 cigarettes a day. Nicotine patch.  Consults: hematology/oncology and neurosurgery   Discharge Exam: Blood pressure 130/62, pulse 78, temperature 98.1 F (36.7 C), temperature source Oral, resp. rate 16, height  (1.6 m), weight 72.6 kg, SpO2 95 %.   Disposition: Discharge disposition: 06-Home-Health Care Svc       Discharge Instructions     Diet - low sodium heart healthy   Complete by: As directed    Increase activity slowly   Complete by: As directed       Allergies as of 09/27/2022       Reactions   Erythromycin Diarrhea   Amoxicillin Rash   Latex Rash   Prednisone Rash        Medication List     STOP taking these medications    hydrochlorothiazide 12.5 MG tablet Commonly known as: HYDRODIURIL   HYDROcodone-acetaminophen 5-325 MG tablet Commonly known as: NORCO/VICODIN   meloxicam 15 MG tablet Commonly known as: MOBIC       TAKE these medications    acetaminophen 500 MG tablet Commonly  known as: TYLENOL Take 500 mg by mouth every 6 (six) hours as needed for moderate pain.   amLODipine 10 MG tablet Commonly known as: NORVASC Take 10 mg by mouth daily.   celecoxib 200 MG capsule Commonly known as:  CELEBREX Take 1 capsule (200 mg total) by mouth every 12 (twelve) hours.   levothyroxine 100 MCG tablet Commonly known as: SYNTHROID Take 1 tablet (100 mcg total) by mouth daily at 6 (six) AM. Start taking on: September 28, 2022 What changed:  medication strength how much to take when to take this   losartan 100 MG tablet Commonly known as: COZAAR Take 100 mg by mouth daily.   methocarbamol 500 MG tablet Commonly known as: ROBAXIN Take 1 tablet (500 mg total) by mouth every 6 (six) hours as needed for muscle spasms.   oxyCODONE 5 MG immediate release tablet Commonly known as: Oxy IR/ROXICODONE Take 1 tablet (5 mg total) by mouth every 3 (three) hours as needed for moderate pain ((score 4 to 6)).   polyethylene glycol 17 g packet Commonly known as: MIRALAX / GLYCOLAX Take 17 g by mouth 2 (two) times daily.   senna 8.6 MG Tabs tablet Commonly known as: SENOKOT Take 1 tablet (8.6 mg total) by mouth 2 (two) times daily.   tamsulosin 0.4 MG Caps capsule Commonly known as: FLOMAX Take 1 capsule (0.4 mg total) by mouth daily. Start taking on: September 28, 2022   VITAMIN D PO Take 1 tablet by mouth daily.        Follow-up Information     [facility] Follow up.   Contact information: [address] [place] [address] [phone]                  Time spent: 36 minutes.   SignedBarnetta Chapel 09/27/2022, 4:39 PM

## 2022-09-27 NOTE — Progress Notes (Signed)
All DC instructions and meds reviewed and copy given. Reviewed all follow up appts with pt and she understands. Pt is going to stay with sister post discharge. Riding home with brother. Pt verbalized no questions about discharge.

## 2022-09-27 NOTE — Telephone Encounter (Signed)
Scheduled per 04/19 inbasket message, patient has been called and notified.

## 2022-09-27 NOTE — Progress Notes (Signed)
Occupational Therapy Treatment Patient Details Name: Hayley Wu MRN: [id number] DOB: 1951/06/05 Today's Date: 09/27/2022   History of present illness 72 yo female admitted 4/12 with RLE weakness and saddle paresthesia with back pain since Nov 23. MRI with pathologic compression fx T6-8 and T12 as well as L2-3 tumor. Ct with bil lung nodules. 4/13 T7 laminectomy and biopsy with probable metastatic lung CA. PMhx: HTN, hypothyroidism   OT comments  Pt making excellent progress towards OT goals and remains eager to participate. Focused on AE education for LB dressing, DME use at home and bathroom mobility. Overall Min A needed for LB ADLs during session and min guard for mobility using RW. Fatigue noted by end of session though pt hopeful to return home today and continue rehab.   Recommendations for follow up therapy are one component of a multi-disciplinary discharge planning process, led by the attending physician.  Recommendations may be updated based on patient status, additional functional criteria and insurance authorization.    Assistance Recommended at Discharge Frequent or constant Supervision/Assistance  Patient can return home with the following  A little help with walking and/or transfers;A little help with bathing/dressing/bathroom;Help with stairs or ramp for entrance;Assist for transportation;Assistance with cooking/housework   Equipment Recommendations  Other (comment) (AE for LB ADL)    Recommendations for Other Services      Precautions / Restrictions Precautions Precautions: Fall;Back Precaution Booklet Issued: Yes (comment) Precaution Comments: pt able to recall 3/3 precautions, needs vc for maintaining Restrictions Weight Bearing Restrictions: No       Mobility Bed Mobility Overal bed mobility: Needs Assistance Bed Mobility: Rolling, Sidelying to Sit, Sit to Sidelying Rolling: Supervision Sidelying to sit: Supervision     Sit to sidelying:  Supervision General bed mobility comments: use of leg lifter to get RLE back into bed w/o physical assist    Transfers Overall transfer level: Needs assistance Equipment used: Rolling walker (2 wheels) Transfers: Sit to/from Stand Sit to Stand: Min guard                 Balance Overall balance assessment: Needs assistance Sitting-balance support: Single extremity supported, Feet supported Sitting balance-Leahy Scale: Fair     Standing balance support: Bilateral upper extremity supported, Reliant on assistive device for balance, During functional activity Standing balance-Leahy Scale: Poor                             ADL either performed or assessed with clinical judgement   ADL Overall ADL's : Needs assistance/impaired     Grooming: Min guard;Standing;Wash/dry hands               Lower Body Dressing: Minimal assistance;Sit to/from stand;With adaptive equipment Lower Body Dressing Details (indicate cue type and reason): educated in various AE for LB dressing w/ pt able to complete most of task with figure four position. Min A needed for AFO mgmt w/ use of shoehorn. Toilet Transfer: Min guard;Ambulation;Rolling walker (2 wheels);Regular Toilet   Toileting- Clothing Manipulation and Hygiene: Minimal assistance;Sitting/lateral lean;Sit to/from stand Toileting - Clothing Manipulation Details (indicate cue type and reason): able to perform hygiene seated on toilet, Min A for clothing mgmt     Functional mobility during ADLs: Min guard;Rolling walker (2 wheels)      Extremity/Trunk Assessment Upper Extremity Assessment Upper Extremity Assessment: Generalized weakness   Lower Extremity Assessment Lower Extremity Assessment: Defer to PT evaluation        Vision  Vision Assessment?: No apparent visual deficits   Perception     Praxis      Cognition Arousal/Alertness: Awake/alert Behavior During Therapy: WFL for tasks assessed/performed Overall  Cognitive Status: Within Functional Limits for tasks assessed                                          Exercises      Shoulder Instructions       General Comments Brother at bedside    Pertinent Vitals/ Pain       Pain Assessment Pain Assessment: Faces Faces Pain Scale: Hurts a little bit Pain Location: back after mobility Pain Descriptors / Indicators: Grimacing, Guarding Pain Intervention(s): Monitored during session  Home Living                                          Prior Functioning/Environment              Frequency  Min 2X/week        Progress Toward Goals  OT Goals(current goals can now be found in the care plan section)  Progress towards OT goals: Progressing toward goals  Acute Rehab OT Goals Patient Stated Goal: home today OT Goal Formulation: With patient Time For Goal Achievement: 10/06/22 Potential to Achieve Goals: Good ADL Goals Pt Will Perform Upper Body Bathing: with modified independence;sitting Pt Will Perform Lower Body Bathing: with modified independence;with adaptive equipment;sit to/from stand;sitting/lateral leans Pt Will Perform Lower Body Dressing: with modified independence;with adaptive equipment;sitting/lateral leans;sit to/from stand Pt Will Transfer to Toilet: with modified independence;ambulating;bedside commode Pt Will Perform Toileting - Clothing Manipulation and hygiene: with modified independence;sitting/lateral leans;sit to/from stand Additional ADL Goal #1: Pt will be able to follow and recall 3/3 back precautions  Plan Discharge plan remains appropriate;Frequency remains appropriate    Co-evaluation                 AM-PAC OT "6 Clicks" Daily Activity     Outcome Measure   Help from another person eating meals?: None Help from another person taking care of personal grooming?: A Little Help from another person toileting, which includes using toliet, bedpan, or urinal?: A  Little Help from another person bathing (including washing, rinsing, drying)?: A Lot Help from another person to put on and taking off regular upper body clothing?: A Little Help from another person to put on and taking off regular lower body clothing?: A Little 6 Click Score: 18    End of Session Equipment Utilized During Treatment: Rolling walker (2 wheels)  OT Visit Diagnosis: Unsteadiness on feet (R26.81);Muscle weakness (generalized) (M62.81);Pain   Activity Tolerance Patient tolerated treatment well   Patient Left in bed;with call bell/phone within reach;with bed alarm set;with family/visitor present   Nurse Communication          Time: 1234-1310 OT Time Calculation (min): 36 min  Charges: OT General Charges $OT Visit: 1 Visit OT Treatments $Self Care/Home Management : 23-37 mins  Hayley Wu, OTR/L Acute Rehab Services Office: [phone]   Lorre Munroe 09/27/2022, 2:07 PM

## 2022-09-28 DIAGNOSIS — G9520 Unspecified cord compression: Secondary | ICD-10-CM | POA: Diagnosis not present

## 2022-09-28 DIAGNOSIS — C7951 Secondary malignant neoplasm of bone: Secondary | ICD-10-CM | POA: Diagnosis not present

## 2022-09-28 DIAGNOSIS — Z720 Tobacco use: Secondary | ICD-10-CM | POA: Diagnosis not present

## 2022-09-28 DIAGNOSIS — Z79891 Long term (current) use of opiate analgesic: Secondary | ICD-10-CM | POA: Diagnosis not present

## 2022-09-28 DIAGNOSIS — R918 Other nonspecific abnormal finding of lung field: Secondary | ICD-10-CM | POA: Diagnosis not present

## 2022-09-28 DIAGNOSIS — M8448XD Pathological fracture, other site, subsequent encounter for fracture with routine healing: Secondary | ICD-10-CM | POA: Diagnosis not present

## 2022-09-28 DIAGNOSIS — D492 Neoplasm of unspecified behavior of bone, soft tissue, and skin: Secondary | ICD-10-CM | POA: Diagnosis not present

## 2022-09-28 DIAGNOSIS — Z4789 Encounter for other orthopedic aftercare: Secondary | ICD-10-CM | POA: Diagnosis not present

## 2022-09-28 DIAGNOSIS — R29898 Other symptoms and signs involving the musculoskeletal system: Secondary | ICD-10-CM | POA: Diagnosis not present

## 2022-09-28 DIAGNOSIS — K8000 Calculus of gallbladder with acute cholecystitis without obstruction: Secondary | ICD-10-CM | POA: Diagnosis not present

## 2022-09-28 DIAGNOSIS — E039 Hypothyroidism, unspecified: Secondary | ICD-10-CM | POA: Diagnosis not present

## 2022-09-28 DIAGNOSIS — I10 Essential (primary) hypertension: Secondary | ICD-10-CM | POA: Diagnosis not present

## 2022-10-01 ENCOUNTER — Other Ambulatory Visit: Payer: Self-pay

## 2022-10-01 ENCOUNTER — Inpatient Hospital Stay: Payer: PPO | Attending: Internal Medicine | Admitting: Internal Medicine

## 2022-10-01 ENCOUNTER — Inpatient Hospital Stay: Payer: PPO

## 2022-10-01 VITALS — BP 120/67 | HR 101 | Temp 97.7°F | Resp 15 | Wt 159.9 lb

## 2022-10-01 DIAGNOSIS — C787 Secondary malignant neoplasm of liver and intrahepatic bile duct: Secondary | ICD-10-CM | POA: Insufficient documentation

## 2022-10-01 DIAGNOSIS — C7951 Secondary malignant neoplasm of bone: Secondary | ICD-10-CM | POA: Insufficient documentation

## 2022-10-01 DIAGNOSIS — C3491 Malignant neoplasm of unspecified part of right bronchus or lung: Secondary | ICD-10-CM | POA: Insufficient documentation

## 2022-10-01 DIAGNOSIS — C3490 Malignant neoplasm of unspecified part of unspecified bronchus or lung: Secondary | ICD-10-CM

## 2022-10-01 DIAGNOSIS — C3431 Malignant neoplasm of lower lobe, right bronchus or lung: Secondary | ICD-10-CM | POA: Diagnosis not present

## 2022-10-01 NOTE — Progress Notes (Signed)
[facility] [phone]   [phone]  OFFICE PROGRESS NOTE  [name], PA-C [address] [place] [address]  DIAGNOSIS: Stage IV (T1b, N2, M1 C) non-small cell lung cancer, adenocarcinoma presented with right lower lobe lung nodule in addition to smaller bilateral pulmonary nodules as well as right hilar and mediastinal lymphadenopathy in addition to liver and bone metastasis diagnosed in April 2024.   PRIOR THERAPY: Status last decompressive thoracic laminectomy and medial facetectomy of T7 with a spinal cord decompression and biopsy of epidural mass under the care of Dr. Marikay Alar on September 21, 2022  CURRENT THERAPY: None  INTERVAL HISTORY: Hayley Wu 72 y.o. female returns to the clinic today for follow-up visit accompanied by her Sister [name] and her daughter [name].  The patient was seen in the hospital and when she presented with back pain and she was found to have progressive tumor at the T7 with epidural extension and cord compression.  She underwent decompressive thoracic laminectomy with spinal cord decompression and biopsy under the care of Dr. Marikay Alar.  The final pathology was consistent with adenocarcinoma of lung primary.  The patient was discharged home few days ago and she is feeling fine and started physical therapy yesterday.  She continues to have weakness in the lower extremities.  She denied having any current chest pain, shortness of breath, cough or hemoptysis.  She has no nausea, vomiting, diarrhea or constipation.  She has no headache or visual changes.  Her MRI of the brain was negative for metastatic disease.  She is here for evaluation and discussion of her treatment options.  MEDICAL HISTORY: Past Medical History:  Diagnosis Date   Allergy    Contact lens/glasses fitting    Hypertension     ALLERGIES:  is allergic to erythromycin, amoxicillin, latex, and prednisone.  MEDICATIONS:  Current  Outpatient Medications  Medication Sig Dispense Refill   acetaminophen (TYLENOL) 500 MG tablet Take 500 mg by mouth every 6 (six) hours as needed for moderate pain.     amLODipine (NORVASC) 10 MG tablet Take 10 mg by mouth daily.     celecoxib (CELEBREX) 200 MG capsule Take 1 capsule (200 mg total) by mouth every 12 (twelve) hours. 60 capsule 0   levothyroxine (SYNTHROID) 100 MCG tablet Take 1 tablet (100 mcg total) by mouth daily at 6 (six) AM. 30 tablet 0   losartan (COZAAR) 100 MG tablet Take 100 mg by mouth daily.     methocarbamol (ROBAXIN) 500 MG tablet Take 1 tablet (500 mg total) by mouth every 6 (six) hours as needed for muscle spasms. 60 tablet 0   oxyCODONE (OXY IR/ROXICODONE) 5 MG immediate release tablet Take 1 tablet (5 mg total) by mouth every 3 (three) hours as needed for moderate pain ((score 4 to 6)). 30 tablet 0   polyethylene glycol (MIRALAX / GLYCOLAX) 17 g packet Take 17 g by mouth 2 (two) times daily. 14 each 0   senna (SENOKOT) 8.6 MG TABS tablet Take 1 tablet (8.6 mg total) by mouth 2 (two) times daily. 120 tablet 0   tamsulosin (FLOMAX) 0.4 MG CAPS capsule Take 1 capsule (0.4 mg total) by mouth daily. 30 capsule 0   VITAMIN D PO Take 1 tablet by mouth daily.     No current facility-administered medications for this visit.    SURGICAL HISTORY:  Past Surgical History:  Procedure Laterality Date   ABDOMINAL HYSTERECTOMY  BREAST SURGERY  2010   rt lumpectomy-neg   LAMINECTOMY N/A 09/21/2022   Procedure: THORACIC LAMINECTOMY FOR TUMOR;  Surgeon: Tia Alert, MD;  Location: [facility] OR;  Service: Neurosurgery;  Laterality: N/A;   OPEN REDUCTION INTERNAL FIXATION (ORIF) DISTAL RADIAL FRACTURE Left 11/26/2013   Procedure: OPEN REDUCTION INTERNAL FIXATION (ORIF) LEFT  DISTAL RADIUS ;  Surgeon: Nestor Lewandowsky, MD;  Location: [facility];  Service: Orthopedics;  Laterality: Left;  with block   TUBAL LIGATION      REVIEW OF SYSTEMS:  Constitutional: positive for  fatigue Eyes: negative Ears, nose, mouth, throat, and face: negative Respiratory: negative Cardiovascular: negative Gastrointestinal: negative Genitourinary:negative Integument/breast: negative Hematologic/lymphatic: negative Musculoskeletal:positive for back pain Neurological: negative Behavioral/Psych: negative Endocrine: negative Allergic/Immunologic: negative   PHYSICAL EXAMINATION: General appearance: alert, cooperative, fatigued, and no distress Head: Normocephalic, without obvious abnormality, atraumatic Neck: no adenopathy, no JVD, supple, symmetrical, trachea midline, and thyroid not enlarged, symmetric, no tenderness/mass/nodules Lymph nodes: Cervical, supraclavicular, and axillary nodes normal. Resp: clear to auscultation bilaterally Back: symmetric, no curvature. ROM normal. No CVA tenderness. Cardio: regular rate and rhythm, S1, S2 normal, no murmur, click, rub or gallop GI: soft, non-tender; bowel sounds normal; no masses,  no organomegaly Extremities: extremities normal, atraumatic, no cyanosis or edema Neurologic: Alert and oriented X 3, normal strength and tone. Normal symmetric reflexes. Normal coordination and gait  ECOG PERFORMANCE STATUS: 1 - Symptomatic but completely ambulatory  Blood pressure 120/67, pulse (!) 101, temperature 97.7 F (36.5 C), temperature source Temporal, resp. rate 15, weight 159 lb 14.4 oz (72.5 kg), SpO2 97 %.  LABORATORY DATA: Lab Results  Component Value Date   WBC 9.3 09/22/2022   HGB 13.5 09/22/2022   HCT 39.3 09/22/2022   MCV 85.2 09/22/2022   PLT 200 09/22/2022      Chemistry      Component Value Date/Time   NA 138 09/23/2022 0151   K 4.6 09/23/2022 0151   CL 105 09/23/2022 0151   CO2 26 09/23/2022 0151   BUN 15 09/23/2022 0151   CREATININE 0.77 09/23/2022 0151      Component Value Date/Time   CALCIUM 8.8 (L) 09/23/2022 0151   ALKPHOS 124 09/20/2022 1507   AST 19 09/20/2022 1507   ALT 19 09/20/2022 1507    BILITOT 0.7 09/20/2022 1507       RADIOGRAPHIC STUDIES: MR BRAIN WO CONTRAST  Result Date: 09/22/2022 CLINICAL DATA:  Initial evaluation for metastatic disease. EXAM: MRI HEAD WITHOUT CONTRAST TECHNIQUE: Multiplanar, multiecho pulse sequences of the brain and surrounding structures were obtained without intravenous contrast. COMPARISON:  Comparison made with prior studies from 09/20/2022 and earlier. FINDINGS: Brain: Cerebral volume within normal limits for age. Scattered patchy T2/FLAIR hyperintensity involving the periventricular, deep, and subcortical white matter both cerebral hemispheres, nonspecific, but most commonly related to chronic microvascular ischemic disease. Overall, changes are moderately advanced in nature. No evidence for acute or subacute ischemia. Gray-white matter differentiation maintained. No areas of chronic cortical infarction. No acute intracranial hemorrhage. Few punctate chronic micro hemorrhages noted about the cerebellum and left cerebral hemisphere, likely small vessel related. No mass lesion, midline shift or mass effect. No hydrocephalus or extra-axial fluid collection. Pituitary gland and suprasellar region within normal limits. No evidence for intracranial metastatic disease on this noncontrast examination. Vascular: Major intracranial vascular flow voids are well maintained. Skull and upper cervical spine: Craniocervical junction within normal limits. Bone marrow signal intensity normal. No visible focal marrow replacing lesion. No scalp soft  tissue abnormality. Sinuses/Orbits: Globes and orbital soft tissues within normal limits. Paranasal sinuses are largely clear. Small bilateral mastoid effusions noted, of doubtful significance. Visualized nasopharynx unremarkable. Other: None. IMPRESSION: 1. No acute intracranial abnormality. No evidence for intracranial metastatic disease on this noncontrast examination. 2. Moderately advanced chronic microvascular ischemic disease.  Electronically Signed   By: Rise Mu M.D.   On: 09/22/2022 20:43   DG Thoracic Spine 2 View  Result Date: 09/21/2022 CLINICAL DATA:  Thoracic laminectomy for tumor. EXAM: THORACIC SPINE 2 VIEWS; DG C-ARM 1-60 MIN-NO REPORT Radiation exposure index: 4.49 mGy. COMPARISON:  MRI of September 20, 2022. FINDINGS: Single intraoperative fluoroscopic image was obtained of the mid to lower thoracic spine. This demonstrates surgical probe directed toward midthoracic vertebral body, although exact level cannot be determined based on limited image. IMPRESSION: Fluoroscopic guidance provided during thoracic spinal surgery. Electronically Signed   By: Lupita Raider M.D.   On: 09/21/2022 12:07   DG C-Arm 1-60 Min-No Report  Result Date: 09/21/2022 Fluoroscopy was utilized by the requesting physician.  No radiographic interpretation.   CT CHEST ABDOMEN PELVIS W CONTRAST  Result Date: 09/20/2022 CLINICAL DATA:  Metastatic disease evaluation. Suspected diffuse osseous metastatic disease in the spine on MRI earlier today. EXAM: CT CHEST, ABDOMEN, AND PELVIS WITH CONTRAST TECHNIQUE: Multidetector CT imaging of the chest, abdomen and pelvis was performed following the standard protocol during bolus administration of intravenous contrast. RADIATION DOSE REDUCTION: This exam was performed according to the departmental dose-optimization program which includes automated exposure control, adjustment of the mA and/or kV according to patient size and/or use of iterative reconstruction technique. CONTRAST:  OMNIPAQUE IOHEXOL 300 MG/ML  SOLN COMPARISON:  Thoracic and lumbar spine MRI earlier today. Chest radiograph earlier today. No remote imaging available. FINDINGS: CT CHEST FINDINGS Cardiovascular: Aortic atherosclerosis and tortuosity. No aortic aneurysm. Normal heart size. There are coronary artery calcifications. No pericardial effusion. No central pulmonary embolus. Mediastinum/Nodes: 12 mm right subcarinal node  series 2, image 26. 10 mm right hilar node series 2, image 25. No enlarged left hilar lymph nodes. Coarsened calcifications in the thyroid gland, patient with history of radioactive iodine therapy. Hypodense 10 mm isthmic nodule, no specific imaging follow-up is recommended per consensus guidelines. No esophageal wall thickening. Lungs/Pleura: Moderate emphysema. Spiculated right lower lobe nodule measuring 17 x 15 mm, series 6, image 83. Linear projections extend peripherally and abuts the major fissure. Spiculated right upper lobe nodule measures 8 x 6 x 7 mm, series 6, image 37. Tiny right apical pulmonary nodules, series 6, images 18, 23, 25. 2 mm nodule superior segment of the right lower lobe series 6, image 41. 4 mm central right middle lobe nodule series 6, image 69. Tiny nodules in the left lung including upper lobe series 6, image 32, 35, and 64. No significant pleural effusion. Minor atelectasis in the dependent lung bases. Musculoskeletal: Thoracic spinal lesions were better assessed on MRI earlier today. Vertebral plana appearance of T7 with mass effect on the spinal canal. Lucent lesions are seen involving C6, T3, T4 posterior elements, and T8. No obvious destructive rib lesions are seen. No evidence of dominant breast mass. CT ABDOMEN PELVIS FINDINGS Hepatobiliary: Circumscribed low-density lesions in the liver, Hounsfield units of approximately 20. There are additional ill-defined liver lesions in the right lobe series 2, images 53, 54 and 58. Largest lesion in the right lobe measures 2.2 cm. Calcified gallstone in the gallbladder. Upper normal common bile duct at 8 mm no visualized obstructing  lesion or stone. Pancreas: Parenchymal atrophy, no evidence of pancreatic mass or inflammation. Spleen: Normal in size without focal abnormality. Adrenals/Urinary Tract: No adrenal nodules. No hydronephrosis or suspicious renal lesion. High-density material within both kidneys in the urinary bladder is likely  excreted contrast. Moderate bladder distension but no wall thickening or focal lesion. Stomach/Bowel: Detailed bowel assessment is limited in the absence of enteric contrast. Minimal hiatal hernia. Stomach is decompressed. Small duodenal diverticulum. No small bowel inflammation or evidence of mass lesion. The cecum is high-riding in the right mid abdomen. Appendix not confidently visualized. Small to moderate colonic stool burden. Descending colonic diverticulosis. No diverticulitis. No evidence of colonic mass. Vascular/Lymphatic: Aortic and branch atherosclerosis. No aortic aneurysm. The portal vein is patent. Periportal lymph nodes are not enlarged by size criteria. No retroperitoneal or pelvic adenopathy. Reproductive: Status post hysterectomy. No adnexal masses. Other: No ascites. No definite mesenteric thickening. No mesenteric nodularity. Diminutive fat containing umbilical hernia Musculoskeletal: Osseous metastatic disease was assessed on lumbar spine exam earlier today, including L2 vertebral body. Diminished cortex in the medial right acetabulum may represent metastatic disease, series 2, image 92. Irregular sclerotic lesion within the intertrochanteric left femur. No abdominal wall soft tissue abnormalities. IMPRESSION: 1. Spiculated right lower lobe pulmonary nodules measures 17 x 15 mm, suspicious for primary bronchogenic malignancy. There is also a spiculated right upper lobe nodule measuring 8 mm. This may be metastatic or second primary. 2. Multiple additional small pulmonary nodules in both lungs. 3. Enlarged right hilar and subcarinal lymph nodes, suspicious for metastatic disease. 4. Multiple hepatic lesions, suspicious for metastatic disease. There may be also additional hepatic cysts. 5. Osseous metastatic disease with thoracic and lumbar spinal lesions, better assessed on MRI earlier today. Possible lesion in the medial right acetabulum. 6. Cholelithiasis. Colonic diverticulosis without  diverticulitis. Aortic Atherosclerosis (ICD10-I70.0) and Emphysema (ICD10-J43.9). Electronically Signed   By: Narda Rutherford M.D.   On: 09/20/2022 19:53   MR THORACIC SPINE W WO CONTRAST  Result Date: 09/20/2022 CLINICAL DATA:  Initial evaluation for ataxia, metastatic disease. EXAM: MRI THORACIC WITHOUT AND WITH CONTRAST TECHNIQUE: Multiplanar and multiecho pulse sequences of the thoracic spine were obtained without and with intravenous contrast. CONTRAST:  7mL GADAVIST GADOBUTROL 1 MMOL/ML IV SOLN COMPARISON:  MRI of the lumbar spine performed earlier the same day. FINDINGS: Alignment: Focal kyphotic angulation of the midthoracic spine centered at the level of T7. Straightening of the normal thoracic kyphosis elsewhere. 3 mm degenerative retrolisthesis of T12 on L1. Vertebrae: Multiple scattered osseous lesions are seen, consistent with osseous metastatic disease. Lesions are seen involving the T3, T6, T7, T8, T9, T11, and T12 vertebral bodies. Involvement of the left greater than right posterior elements at T4. Involvement is most pronounced at the T6 through T8 levels. Associated severe pathologic compression fracture of T7 with vertebral plana formation and focal kyphotic angulation. Additional more mild compression deformities at the adjacent T6 and T8 vertebral bodies. Epidural extension with tumor seen involving the adjacent ventral epidural space (series 20, image 20). Enhancing ventral epidural tumor seen extending from T5 through T9. Additional enhancing epidural tumor seen at the dorsal epidural space, extending from T4 through T9-10. Resultant cord compression at T7 and severe spinal stenosis. Patchy cord signal changes seen at this level (series 19, image 8). Slight extra osseous extension of tumor also noted within the paravertebral soft tissues at the T6 through T8 levels. Additional associated pathologic compression deformity noted at the superior endplate of T12 with up to 40% height  loss and  trace bony retropulsion, but no significant stenosis at this level. Additional scattered involvement of the cervical spine, most notably at C6 is noted on counter sequence. Cord: Epidural tumor with severe spinal stenosis and cord compression at T7. Patchy cord signal changes consistent with edema and/or myelomalacia. Paraspinal and other soft tissues: Paraspinous soft tissues demonstrate no other acute finding. Few scattered probable pulmonary nodules seen within the partially visualized right lung, concerning for metastatic disease. Possible cholelithiasis noted. Disc levels: Underlying mild for age degenerative spondylosis. No other significant spinal stenosis. IMPRESSION: 1. Widespread osseous metastatic disease involving the thoracic spine as above. Associated pathologic compression fractures of T6, T7, T8, and T12. Fracture most severe at T7 where there is associated vertebral plana formation. Adjacent epidural extension as above with resultant severe spinal stenosis and cord compression at T7. Emergent neuro surgical consultation recommended. 2. Few scattered probable pulmonary nodules within the partially visualized right lung, concerning for metastatic disease. 3. Possible cholelithiasis. Critical Value/emergent results were called by telephone at the time of interpretation on 09/20/2022 at 7:13 pm to provider [facility] , who verbally acknowledged these results. Electronically Signed   By: Rise Mu M.D.   On: 09/20/2022 19:17   DG Chest Portable 1 View  Result Date: 09/20/2022 CLINICAL DATA:  Evaluate for lung lesions. MRI lumbar spine report from earlier same day describes findings suspicious for osseous metastatic disease. EXAM: PORTABLE CHEST 1 VIEW COMPARISON:  None Available. FINDINGS: Questionable spiculated nodule within the RIGHT mid/lower lung. RIGHT lung appears otherwise clear. LEFT lung appears clear. No pleural effusion or pneumothorax is seen. Heart size and mediastinal  contours are within normal limits. IMPRESSION: 1. Questionable spiculated pulmonary nodule within the RIGHT mid/lower lung. Recommend chest CT for further characterization. 2. Lungs otherwise clear. No evidence of pneumonia or pulmonary edema. Electronically Signed   By: Bary Richard M.D.   On: 09/20/2022 16:02   MR Lumbar Spine W Wo Contrast  Result Date: 09/20/2022 CLINICAL DATA:  Lumbar arthritis, difficulty walking. Can not move right leg. EXAM: MRI LUMBAR SPINE WITHOUT AND WITH CONTRAST TECHNIQUE: Multiplanar and multiecho pulse sequences of the lumbar spine were obtained without and with intravenous contrast. CONTRAST:  7mL GADAVIST GADOBUTROL 1 MMOL/ML IV SOLN COMPARISON:  None Available. FINDINGS: Segmentation: Standard; the lowest formed disc space is designated L5-S1. Alignment: There is mild levocurvature centered at L2-L3. There is grade 1 retrolisthesis of T12 on L1 through L2 on L3 and 2grade 1 anterolisthesis of L3 on L4 and L4 on L5. Vertebrae: There is mild degenerative endplate signal abnormality anteriorly at T11-T12. There is a 1.1 cm T1 hypointense, enhancing lesion along the superior T11 endplate. There is a 1.9 cm enhancing lesion along the superior T12 endplate. Much of the L2 vertebral body has been replaced. Findings are highly suspicious for osseous metastatic disease. There is extraosseous tumor along the right aspect of the L2 vertebral body extending into the L2-L3 neural foramen (12-6, 15-15). There is no appreciable epidural tumor within the spinal canal. There is compression deformity of the superior T12 endplate with approximately 10% loss of vertebral body height and no bony retropulsion, possibly acute. There is no other evidence of pathologic fracture. Conus medullaris and cauda equina: Conus extends to the L2 level. Conus and cauda equina appear normal. Paraspinal and other soft tissues: A 3.6 cm gallstone is noted. The common bile duct is dilated measuring up to 9 mm with  tapering at the level of the ampulla. There is  a 2.1 cm T2 hyperintense lesion in the left pelvis abutting the medial aspect of the psoas muscle (8-37). The paraspinal soft tissues are unremarkable. Disc levels: T12-L1: Trace retrolisthesis without significant spinal canal or neural foraminal stenosis L1-L2: There is trace retrolisthesis and mild facet arthropathy but no significant spinal canal or neural foraminal stenosis L2-L3:There is a diffuse disc bulge and advanced bilateral facet arthropathy with ligamentum flavum thickening resulting moderate spinal canal stenosis with bilateral subarticular zone narrowing, and moderate right and mild left neural foraminal stenosis. L3-L4: There is trace anterolisthesis with slight uncovering of the disc posteriorly and a minimal bulge, and advanced bilateral facet arthropathy with ligamentum flavum thickening resulting in moderate spinal canal stenosis with bilateral subarticular zone narrowing and no significant neural foraminal stenosis L4-L5: There is grade 1 anterolisthesis with uncovering of the disc posteriorly and advanced bilateral facet arthropathy resulting in moderate spinal canal stenosis with bilateral subarticular zone narrowing and no significant neural foraminal stenosis L5-S1: There is a shallow left subarticular zone protrusion and annular fissure and advanced bilateral facet arthropathy but no significant spinal canal or neural foraminal stenosis. IMPRESSION: 1. 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 No appreciable epidural tumor within the spinal canal. Recommend oncologic workup. 2. Mild compression deformity of the superior T12 endplate with approximately 10% loss of vertebral body height and no bony retropulsion consistent with pathologic fracture, possibly acute. 3. Advanced bilateral facet  arthropathy at L2-L3 through L5-S1 with moderate spinal canal stenosis and bilateral subarticular zone narrowing at L2-L3 through L4-L5 4. 3.6 cm gallstone. The common bile duct is dilated measuring up to 9 mm with tapering at the level of the ampulla. Correlate with LFTs and consider dedicated imaging as indicated. 5. 2.1 cm T2 hyperintense lesion in the left pelvis abutting the medial aspect of the psoas muscle is indeterminate and may reflect an ovarian cyst, lymphocele, or inclusion cyst. Recommend attention on follow-up imaging. Electronically Signed   By: Lesia Hausen M.D.   On: 09/20/2022 14:54    ASSESSMENT AND PLAN: This is a very pleasant 72 years old white female recently diagnosed with stage IV (T1b, N2, M1 C) non-small cell lung cancer, adenocarcinoma presented with right lower lobe lung nodule in addition to small bilateral pulmonary nodules and right hilar and mediastinal lymphadenopathy in addition to liver and bone metastasis diagnosed in April 2024.  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 The patient is seen by Dr. Basilio Cairo and expected to start palliative radiotherapy to the area of the bone metastasis. I had a lengthy discussion with the patient and her family today about her current disease stage, prognosis and treatment options. The patient understands that she has incurable condition and all the treatment will be of palliative nature. I recommended for the patient to have molecular studies done by [email or web address] on the blood sample and I will also send her tissue to [facility] for molecular studies and PD-L1 expression. I will complete the staging workup by ordering a PET scan. I will see the patient back for follow-up visit in around 2 weeks for evaluation and more detailed discussion of her treatment options based on the final staging workup and molecular studies. For the weakness of  the lower extremities, she will continue with the physical therapy.  The patient was advised to call immediately if she has any other concerning symptoms in the interval. The patient voices understanding of current disease status and treatment options and is in agreement with the current care plan.  All questions were answered. The patient knows to call the clinic with any problems, questions or concerns. We can certainly see the patient much sooner if necessary.  The total time spent in the appointment was 40 minutes.  Disclaimer: This note was dictated with voice recognition software. Similar sounding words can inadvertently be transcribed and may not be corrected upon review.

## 2022-10-01 NOTE — Progress Notes (Signed)
Request for [employer] sent to Pathology tech at [facility]

## 2022-10-02 ENCOUNTER — Encounter: Payer: Self-pay | Admitting: Radiation Oncology

## 2022-10-02 ENCOUNTER — Telehealth: Payer: Self-pay

## 2022-10-02 NOTE — Progress Notes (Addendum)
NN was unable to attend the pt's consult appt on 4/23. NN called the pt today to make self-introduction and assess for any navigation needs.  At this time, pt denies any concerns for which she may need assistance. NN provided contact information and encouraged pt to call with any concerns or questions.

## 2022-10-02 NOTE — Telephone Encounter (Signed)
RN called pt to obtain meaningful use and consult information. Pt was doing well with this call and had no major concerns.

## 2022-10-03 NOTE — Progress Notes (Signed)
Radiation Oncology         [phone] ________________________________  Initial Outpatient Consultation  Name: Hayley Wu MRN: [id number]  Date: 10/04/2022  DOB: 10/18/1950  [id number], Lawrence Marseilles, MD   REFERRING PHYSICIAN: Tia Alert, MD  DIAGNOSIS:  C79.51   ICD-10-CM   1. 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 Cancer, metastatic to bone [facility])  C79.51      Stage IV (T1b, N2, M1 C) non-small cell lung cancer, adenocarcinoma presented with right lower lobe lung nodule, small bilateral pulmonary nodules, right hilar and mediastinal lymphadenopathy in addition to liver and bone metastasis diagnosed in April 2024. S/p decompressive thoracic laminectomy of T7 with spinal cord decompression and biopsy of the epidural mass in April 2024.    Cancer Staging  Adenocarcinoma of right lung, stage 4 (HCC) Staging form: Lung, AJCC 8th Edition - Clinical: Stage IVB (cT1b, cN2, cM1c) - Signed by Si Gaul, MD on 10/01/2022   CHIEF COMPLAINT: Here to discuss palliative management of osseous metastatic disease to the thoracic spine from a lung cancer primary   HISTORY OF PRESENT ILLNESS::Hayley Wu is a 72 y.o. female who presents today for consideration of palliative radiation therapy as a part of management for her recently diagnosed spinal metastasis from a right lung cancer primary. On record review, it seems as though the patient first began to endorse thoracic back pain around November of 2023. She presented to an [facility] facility at that time but encounter details are not available.   On 09/20/22, the patient presented to the ED with acute right lower extremity weakness and progressive issues with ambulation x 2 months. Imaging performed while inpatient revealed evidence of widespread metastatic disease, detailed as follows.  -- MRI of the lumbar spine performed on 04/12 demonstrated:  findings highly suspicious for osseous metastatic disease at T11, T12, and L2 with extraosseous tumor along the right aspect of the L2 vertebral body extending into the L2-L3 neural foramen; mild compression deformity of the superior T12 endplate with approximately 10% loss of vertebral body height and no bony retropulsion consistent with pathologic fracture, (possibly acute); advanced bilateral facet arthropathy at L2-L3 through L5-S1 with moderate spinal canal stenosis and bilateral subarticular zone narrowing at L2-L3 through L4-L5; and an indeterminate 2.1 cm T2 hyperintense lesion in the left pelvis abutting the medial aspect of the psoas muscle. -- MRI of the thoracic spine on 04/12 showed: widespread osseous metastatic disease involving the thoracic Spine, with associated pathologic compression fractures of T6, T7, T8, and T12, most severe at T7 containing associated vertebral plana formation; adjacent epidural extension with resultant severe spinal stenosis and cord compression at T7; and a few scattered probable pulmonary nodules within the partially visualized right lung, concerning for metastatic disease. -- CT CAP on 04/12 demonstrated: a spiculated right lower lobe pulmonary nodule measuring 17 x 15 mm, suspicious for primary bronchogenic malignancy; a spiculated right upper lobe nodule measuring 8 mm (possibly representing metastatic disease or second primary); multiple additional small pulmonary nodules in both lungs; enlarged right hilar and subcarinal lymph nodes, suspicious for metastatic disease; multiple hepatic lesions, suspicious for metastatic disease along with possible additional hepatic cysts; and the previously demonstrated osseous metastatic disease consisting of thoracic and lumbar spinal lesions, as well as a possible lesion in the medial right acetabulum. -- MRI of the brain on 04/14 to evaluate for metastatic  disease showed no evidence of intracranial metastatic disease.    She  was ultimately admitted from 09/20/22 through 09/27/22 with the working diagnosis of cord compression due to wide metastatic disease and pathologic fracture 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 She was evaluated by neurosurgery and underwent resection of the thoracic tumor (with decompressive thoracic laminectomy, medial facetectomy of T7, and spinal cord decompression) on 09/21/22. Biopsy of the thoracic tumor revealed metastatic carcinoma with morphologic and immunohistochemical features most consistent with a metastatic adenocarcinoma of lung origin. The remainder of her hospital course included pain management, and she began meeting with PT following discharge. She was evaluated Dr. Arbutus Ped while inpatient who recommended proceeding with OP follow-up for further management.   Accordingly, the patient met with Dr. Arbutus Ped on 10/01/22 to discuss treatment options. During this visit, the patient endorsed ongoing bilateral LE weakness. She denied any other symptoms whatsoever. Moving forward, Dr. Arbutus Ped recommends obtaining molecular studies including Guardant360 on the blood sample and foundation 1 for molecular studies and PD-L1 expression. He has also ordered a PET scan to complete her staging work-up (scheduled for 10/17/22). The patient will return to Dr. Arbutus Ped in approximately 2 weeks for evaluation and a more detailed discussion of her treatment options based on the final staging workup and molecular studies.  For her LE weakness, she will continue to meet with physical therapy  She will see neurosurgery next week to have the staples removed from her back.  PET scan is pending for May 9  I personally reviewed her imaging  PREVIOUS RADIATION THERAPY: No  PAST MEDICAL HISTORY:  has a past medical history of Allergy, Contact lens/glasses fitting, and Hypertension.    PAST SURGICAL HISTORY: Past Surgical History:  Procedure Laterality Date   ABDOMINAL HYSTERECTOMY     BREAST SURGERY  2010   rt lumpectomy-neg    LAMINECTOMY N/A 09/21/2022   Procedure: THORACIC LAMINECTOMY FOR TUMOR;  Surgeon: Tia Alert, MD;  Location: [facility] OR;  Service: Neurosurgery;  Laterality: N/A;   OPEN REDUCTION INTERNAL FIXATION (ORIF) DISTAL RADIAL FRACTURE Left 11/26/2013   Procedure: OPEN REDUCTION INTERNAL FIXATION (ORIF) LEFT  DISTAL RADIUS ;  Surgeon: Nestor Lewandowsky, MD;  Location: [facility];  Service: Orthopedics;  Laterality: Left;  with block   TUBAL LIGATION      FAMILY HISTORY: family history includes Cancer in her daughter and mother; Diabetes in her maternal grandfather; Heart attack in her brother and father; Hypertension in her brother, father, and sister.  SOCIAL HISTORY:  reports that she has been smoking. She has been smoking an average of 1 pack per day. She does not have any smokeless tobacco history on file. She reports that she does not drink alcohol and does not use drugs.  ALLERGIES: Erythromycin, Amoxicillin, Latex, and Prednisone  MEDICATIONS:  Current Outpatient Medications  Medication Sig Dispense Refill   acetaminophen (TYLENOL) 500 MG tablet Take 500 mg by mouth every 6 (six) hours as needed for moderate pain.     amLODipine (NORVASC) 10 MG tablet Take 10 mg by mouth daily.     celecoxib (CELEBREX) 200 MG capsule Take 1 capsule (200 mg total) by mouth every 12 (twelve) hours. 60 capsule 0   levothyroxine (SYNTHROID) 100 MCG tablet Take 1 tablet (100 mcg total) by mouth daily at 6 (six) AM. 30 tablet 0   losartan (COZAAR) 100 MG tablet Take 100 mg by mouth daily.     methocarbamol (ROBAXIN) 500 MG tablet Take 1 tablet (500 mg total)  by mouth every 6 (six) hours as needed for muscle spasms. 60 tablet 0   oxyCODONE (OXY IR/ROXICODONE) 5 MG immediate release tablet Take 1 tablet (5 mg total) by mouth every 3 (three) hours as needed for moderate pain ((score 4 to 6)). 30 tablet 0   polyethylene glycol (MIRALAX / GLYCOLAX) 17 g packet Take 17 g by mouth 2 (two) times daily. 14 each 0    senna (SENOKOT) 8.6 MG TABS tablet Take 1 tablet (8.6 mg total) by mouth 2 (two) times daily. 120 tablet 0   tamsulosin (FLOMAX) 0.4 MG CAPS capsule Take 1 capsule (0.4 mg total) by mouth daily. 30 capsule 0   VITAMIN D PO Take 1 tablet by mouth daily.     No current facility-administered medications for this encounter.    REVIEW OF SYSTEMS:  Notable for that above.   PHYSICAL EXAM:  height is 5\' 3"  (1.6 m) and weight is 159 lb (72.1 kg). Her temporal temperature is 97 F (36.1 C) (abnormal). Her blood pressure is 121/70 and her pulse is 78. Her respiration is 18 and oxygen saturation is 98%.   General: Alert and oriented, in no acute distress -sitting in a wheelchair HEENT: Head is normocephalic. Extraocular movements are intact. Oropharynx is clear. Neck: Neck is supple Heart: Regular in rate and rhythm with no murmurs, rubs, or gallops. Chest: Clear to auscultation bilaterally, with no rhonchi, wheezes, or rales. Abdomen: Soft, nontender, nondistended, with no rigidity or guarding. Extremities: No cyanosis or edema.   Skin: No concerning lesions.  There is still some staples in the surgical site of her mid thoracic spine.  Incision healing nicely without any drainage Musculoskeletal:   Significant weakness in bilateral lower extremities, right weaker than left.  She needs to physically lift her legs with her hands as hip flexion is particularly compromised.   Neurologic: Cranial nerves II through XII are grossly intact. No obvious focalities. Speech is fluent. Coordination is intact.  She reports reduced sensation throughout her legs bilaterally Psychiatric: Judgment and insight are intact. Affect is appropriate.  KPS = 60  100 - Normal; no complaints; no evidence of disease. 90   - Able to carry on normal activity; minor signs or symptoms of disease. 80   - Normal activity with effort; some signs or symptoms of disease. 28   - Cares for self; unable to carry on normal activity or to  do active work. 60   - Requires occasional assistance, but is able to care for most of his personal needs. 50   - Requires considerable assistance and frequent medical care. 40   - Disabled; requires special care and assistance. 30   - Severely disabled; hospital admission is indicated although death not imminent. 20   - Very sick; hospital admission necessary; active supportive treatment necessary. 10   - Moribund; fatal processes progressing rapidly. 0     - Dead  Karnofsky DA, Abelmann WH, Craver LS and Burchenal [facility] [phone]) The use of the nitrogen mustards in the palliative treatment of carcinoma: with particular reference to bronchogenic carcinoma Cancer 1 [id number]  ECOG = 3  0 - Asymptomatic (Fully active, able to carry on all predisease activities without restriction)  1 - Symptomatic but completely ambulatory (Restricted in physically strenuous activity but ambulatory and able to carry out work of a light or sedentary nature. For example, light housework, office work)  2 - Symptomatic, <50% in bed during the day (Ambulatory and capable of all self care  but unable to carry out any work activities. Up and about more than 50% of waking hours)  3 - Symptomatic, >50% in bed, but not bedbound (Capable of only limited self-care, confined to bed or chair 50% or more of waking hours)  4 - Bedbound (Completely disabled. Cannot carry on any self-care. Totally confined to bed or chair)  5 - Death   Santiago Glad MM, Creech RH, Tormey DC, et al. [phone]). "Toxicity and response criteria of the [facility]". Am. Evlyn Clines. Oncol. 5 (6): 649-55   LABORATORY DATA:  Lab Results  Component Value Date   WBC 9.3 09/22/2022   HGB 13.5 09/22/2022   HCT 39.3 09/22/2022   MCV 85.2 09/22/2022   PLT 200 09/22/2022   CMP     Component Value Date/Time   NA 138 09/23/2022 0151   K 4.6 09/23/2022 0151   CL 105 09/23/2022 0151   CO2 26 09/23/2022 0151   GLUCOSE 128 (H) 09/23/2022 0151    BUN 15 09/23/2022 0151   CREATININE 0.77 09/23/2022 0151   CALCIUM 8.8 (L) 09/23/2022 0151   PROT 7.0 09/20/2022 1507   ALBUMIN 4.3 09/20/2022 1507   AST 19 09/20/2022 1507   ALT 19 09/20/2022 1507   ALKPHOS 124 09/20/2022 1507   BILITOT 0.7 09/20/2022 1507   GFRNONAA >60 09/23/2022 0151   GFRAA >90 11/25/2013 1600         RADIOGRAPHY: MR BRAIN WO CONTRAST  Result Date: 09/22/2022 CLINICAL DATA:  Initial evaluation for metastatic disease. EXAM: MRI HEAD WITHOUT CONTRAST TECHNIQUE: Multiplanar, multiecho pulse sequences of the brain and surrounding structures were obtained without intravenous contrast. COMPARISON:  Comparison made with prior studies from 09/20/2022 and earlier. FINDINGS: Brain: Cerebral volume within normal limits for age. Scattered patchy T2/FLAIR hyperintensity involving the periventricular, deep, and subcortical white matter both cerebral hemispheres, nonspecific, but most commonly related to chronic microvascular ischemic disease. Overall, changes are moderately advanced in nature. No evidence for acute or subacute ischemia. Gray-white matter differentiation maintained. No areas of chronic cortical infarction. No acute intracranial hemorrhage. Few punctate chronic micro hemorrhages noted about the cerebellum and left cerebral hemisphere, likely small vessel related. No mass lesion, midline shift or mass effect. No hydrocephalus or extra-axial fluid collection. Pituitary gland and suprasellar region within normal limits. No evidence for intracranial metastatic disease on this noncontrast examination. Vascular: Major intracranial vascular flow voids are well maintained. Skull and upper cervical spine: Craniocervical junction within normal limits. Bone marrow signal intensity normal. No visible focal marrow replacing lesion. No scalp soft tissue abnormality. Sinuses/Orbits: Globes and orbital soft tissues within normal limits. Paranasal sinuses are largely clear. Small bilateral  mastoid effusions noted, of doubtful significance. Visualized nasopharynx unremarkable. Other: None. IMPRESSION: 1. No acute intracranial abnormality. No evidence for intracranial metastatic disease on this noncontrast examination. 2. Moderately advanced chronic microvascular ischemic disease. Electronically Signed   By: Rise Mu M.D.   On: 09/22/2022 20:43   DG Thoracic Spine 2 View  Result Date: 09/21/2022 CLINICAL DATA:  Thoracic laminectomy for tumor. EXAM: THORACIC SPINE 2 VIEWS; DG C-ARM 1-60 MIN-NO REPORT Radiation exposure index: 4.49 mGy. COMPARISON:  MRI of September 20, 2022. FINDINGS: Single intraoperative fluoroscopic image was obtained of the mid to lower thoracic spine. This demonstrates surgical probe directed toward midthoracic vertebral body, although exact level cannot be determined based on limited image. IMPRESSION: Fluoroscopic guidance provided during thoracic spinal surgery. Electronically Signed   By: Lupita Raider M.D.   On: 09/21/2022  12:07   DG C-Arm 1-60 Min-No Report  Result Date: 09/21/2022 Fluoroscopy was utilized by the requesting physician.  No radiographic interpretation.   CT CHEST ABDOMEN PELVIS W CONTRAST  Result Date: 09/20/2022 CLINICAL DATA:  Metastatic disease evaluation. Suspected diffuse osseous metastatic disease in the spine on MRI earlier today. EXAM: CT CHEST, ABDOMEN, AND PELVIS WITH CONTRAST TECHNIQUE: Multidetector CT imaging of the chest, abdomen and pelvis was performed following the standard protocol during bolus administration of intravenous contrast. RADIATION DOSE REDUCTION: This exam was performed according to the departmental dose-optimization program which includes automated exposure control, adjustment of the mA and/or kV according to patient size and/or use of iterative reconstruction technique. CONTRAST:  OMNIPAQUE IOHEXOL 300 MG/ML  SOLN COMPARISON:  Thoracic and lumbar spine MRI earlier today. Chest radiograph earlier today.  No remote imaging available. FINDINGS: CT CHEST FINDINGS Cardiovascular: Aortic atherosclerosis and tortuosity. No aortic aneurysm. Normal heart size. There are coronary artery calcifications. No pericardial effusion. No central pulmonary embolus. Mediastinum/Nodes: 12 mm right subcarinal node series 2, image 26. 10 mm right hilar node series 2, image 25. No enlarged left hilar lymph nodes. Coarsened calcifications in the thyroid gland, patient with history of radioactive iodine therapy. Hypodense 10 mm isthmic nodule, no specific imaging follow-up is recommended per consensus guidelines. No esophageal wall thickening. Lungs/Pleura: Moderate emphysema. Spiculated right lower lobe nodule measuring 17 x 15 mm, series 6, image 83. Linear projections extend peripherally and abuts the major fissure. Spiculated right upper lobe nodule measures 8 x 6 x 7 mm, series 6, image 37. Tiny right apical pulmonary nodules, series 6, images 18, 23, 25. 2 mm nodule superior segment of the right lower lobe series 6, image 41. 4 mm central right middle lobe nodule series 6, image 69. Tiny nodules in the left lung including upper lobe series 6, image 32, 35, and 64. No significant pleural effusion. Minor atelectasis in the dependent lung bases. Musculoskeletal: Thoracic spinal lesions were better assessed on MRI earlier today. Vertebral plana appearance of T7 with mass effect on the spinal canal. Lucent lesions are seen involving C6, T3, T4 posterior elements, and T8. No obvious destructive rib lesions are seen. No evidence of dominant breast mass. CT ABDOMEN PELVIS FINDINGS Hepatobiliary: Circumscribed low-density lesions in the liver, Hounsfield units of approximately 20. There are additional ill-defined liver lesions in the right lobe series 2, images 53, 54 and 58. Largest lesion in the right lobe measures 2.2 cm. Calcified gallstone in the gallbladder. Upper normal common bile duct at 8 mm no visualized obstructing lesion or stone.  Pancreas: Parenchymal atrophy, no evidence of pancreatic mass or inflammation. Spleen: Normal in size without focal abnormality. Adrenals/Urinary Tract: No adrenal nodules. No hydronephrosis or suspicious renal lesion. High-density material within both kidneys in the urinary bladder is likely excreted contrast. Moderate bladder distension but no wall thickening or focal lesion. Stomach/Bowel: Detailed bowel assessment is limited in the absence of enteric contrast. Minimal hiatal hernia. Stomach is decompressed. Small duodenal diverticulum. No small bowel inflammation or evidence of mass lesion. The cecum is high-riding in the right mid abdomen. Appendix not confidently visualized. Small to moderate colonic stool burden. Descending colonic diverticulosis. No diverticulitis. No evidence of colonic mass. Vascular/Lymphatic: Aortic and branch atherosclerosis. No aortic aneurysm. The portal vein is patent. Periportal lymph nodes are not enlarged by size criteria. No retroperitoneal or pelvic adenopathy. Reproductive: Status post hysterectomy. No adnexal masses. Other: No ascites. No definite mesenteric thickening. No mesenteric nodularity. Diminutive fat containing umbilical  hernia Musculoskeletal: Osseous metastatic disease was assessed on lumbar spine exam earlier today, including L2 vertebral body. Diminished cortex in the medial right acetabulum may represent metastatic disease, series 2, image 92. Irregular sclerotic lesion within the intertrochanteric left femur. No abdominal wall soft tissue abnormalities. IMPRESSION: 1. Spiculated right lower lobe pulmonary nodules measures 17 x 15 mm, suspicious for primary bronchogenic malignancy. There is also a spiculated right upper lobe nodule measuring 8 mm. This may be metastatic or second primary. 2. Multiple additional small pulmonary nodules in both lungs. 3. Enlarged right hilar and subcarinal lymph nodes, suspicious for metastatic disease. 4. Multiple hepatic  lesions, suspicious for metastatic disease. There may be also additional hepatic cysts. 5. Osseous metastatic disease with thoracic and lumbar spinal lesions, better assessed on MRI earlier today. Possible lesion in the medial right acetabulum. 6. Cholelithiasis. Colonic diverticulosis without diverticulitis. Aortic Atherosclerosis (ICD10-I70.0) and Emphysema (ICD10-J43.9). Electronically Signed   By: Narda Rutherford M.D.   On: 09/20/2022 19:53   MR THORACIC SPINE W WO CONTRAST  Result Date: 09/20/2022 CLINICAL DATA:  Initial evaluation for ataxia, metastatic disease. EXAM: MRI THORACIC WITHOUT AND WITH CONTRAST TECHNIQUE: Multiplanar and multiecho pulse sequences of the thoracic spine were obtained without and with intravenous contrast. CONTRAST:  7mL GADAVIST GADOBUTROL 1 MMOL/ML IV SOLN COMPARISON:  MRI of the lumbar spine performed earlier the same day. FINDINGS: Alignment: Focal kyphotic angulation of the midthoracic spine centered at the level of T7. Straightening of the normal thoracic kyphosis elsewhere. 3 mm degenerative retrolisthesis of T12 on L1. Vertebrae: Multiple scattered osseous lesions are seen, consistent with osseous metastatic disease. Lesions are seen involving the T3, T6, T7, T8, T9, T11, and T12 vertebral bodies. Involvement of the left greater than right posterior elements at T4. Involvement is most pronounced at the T6 through T8 levels. Associated severe pathologic compression fracture of T7 with vertebral plana formation and focal kyphotic angulation. Additional more mild compression deformities at the adjacent T6 and T8 vertebral bodies. Epidural extension with tumor seen involving the adjacent ventral epidural space (series 20, image 20). Enhancing ventral epidural tumor seen extending from T5 through T9. Additional enhancing epidural tumor seen at the dorsal epidural space, extending from T4 through T9-10. Resultant cord compression at T7 and severe spinal stenosis. Patchy cord  signal changes seen at this level (series 19, image 8). Slight extra osseous extension of tumor also noted within the paravertebral soft tissues at the T6 through T8 levels. Additional associated pathologic compression deformity noted at the superior endplate of T12 with up to 40% height loss and trace bony retropulsion, but no significant stenosis at this level. Additional scattered involvement of the cervical spine, most notably at C6 is noted on counter sequence. Cord: Epidural tumor with severe spinal stenosis and cord compression at T7. Patchy cord signal changes consistent with edema and/or myelomalacia. Paraspinal and other soft tissues: Paraspinous soft tissues demonstrate no other acute finding. Few scattered probable pulmonary nodules seen within the partially visualized right lung, concerning for metastatic disease. Possible cholelithiasis noted. Disc levels: Underlying mild for age degenerative spondylosis. No other significant spinal stenosis. IMPRESSION: 1. Widespread osseous metastatic disease involving the thoracic spine as above. Associated pathologic compression fractures of T6, T7, T8, and T12. Fracture most severe at T7 where there is associated vertebral plana formation. Adjacent epidural extension as above with resultant severe spinal stenosis and cord compression at T7. Emergent neuro surgical consultation recommended. 2. Few scattered probable pulmonary nodules within the partially visualized right lung, concerning  for metastatic disease. 3. Possible cholelithiasis. Critical Value/emergent results were called by telephone at the time of interpretation on 09/20/2022 at 7:13 pm to provider [facility] , who verbally acknowledged these results. Electronically Signed   By: Rise Mu M.D.   On: 09/20/2022 19:17   DG Chest Portable 1 View  Result Date: 09/20/2022 CLINICAL DATA:  Evaluate for lung lesions. MRI lumbar spine report from earlier same day describes findings suspicious for  osseous metastatic disease. EXAM: PORTABLE CHEST 1 VIEW COMPARISON:  None Available. FINDINGS: Questionable spiculated nodule within the RIGHT mid/lower lung. RIGHT lung appears otherwise clear. LEFT lung appears clear. No pleural effusion or pneumothorax is seen. Heart size and mediastinal contours are within normal limits. IMPRESSION: 1. Questionable spiculated pulmonary nodule within the RIGHT mid/lower lung. Recommend chest CT for further characterization. 2. Lungs otherwise clear. No evidence of pneumonia or pulmonary edema. Electronically Signed   By: Bary Richard M.D.   On: 09/20/2022 16:02   MR Lumbar Spine W Wo Contrast  Result Date: 09/20/2022 CLINICAL DATA:  Lumbar arthritis, difficulty walking. Can not move right leg. EXAM: MRI LUMBAR SPINE WITHOUT AND WITH CONTRAST TECHNIQUE: Multiplanar and multiecho pulse sequences of the lumbar spine were obtained without and with intravenous contrast. CONTRAST:  7mL GADAVIST GADOBUTROL 1 MMOL/ML IV SOLN COMPARISON:  None Available. FINDINGS: Segmentation: Standard; the lowest formed disc space is designated L5-S1. Alignment: There is mild levocurvature centered at L2-L3. There is grade 1 retrolisthesis of T12 on L1 through L2 on L3 and 2grade 1 anterolisthesis of L3 on L4 and L4 on L5. Vertebrae: There is mild degenerative endplate signal abnormality anteriorly at T11-T12. There is a 1.1 cm T1 hypointense, enhancing lesion along the superior T11 endplate. There is a 1.9 cm enhancing lesion along the superior T12 endplate. Much of the L2 vertebral body has been replaced. Findings are highly suspicious for osseous metastatic disease. There is extraosseous tumor along the right aspect of the L2 vertebral body extending into the L2-L3 neural foramen (12-6, 15-15). There is no appreciable epidural tumor within the spinal canal. There is compression deformity of the superior T12 endplate with approximately 10% loss of vertebral body height and no bony retropulsion,  possibly acute. There is no other evidence of pathologic fracture. Conus medullaris and cauda equina: Conus extends to the L2 level. Conus and cauda equina appear normal. Paraspinal and other soft tissues: A 3.6 cm gallstone is noted. The common bile duct is dilated measuring up to 9 mm with tapering at the level of the ampulla. There is a 2.1 cm T2 hyperintense lesion in the left pelvis abutting the medial aspect of the psoas muscle (8-37). The paraspinal soft tissues are unremarkable. Disc levels: T12-L1: Trace retrolisthesis without significant spinal canal or neural foraminal stenosis L1-L2: There is trace retrolisthesis and mild facet arthropathy but no significant spinal canal or neural foraminal stenosis L2-L3:There is a diffuse disc bulge and advanced bilateral facet arthropathy with ligamentum flavum thickening resulting moderate spinal canal stenosis with bilateral subarticular zone narrowing, and moderate right and mild left neural foraminal stenosis. L3-L4: There is trace anterolisthesis with slight uncovering of the disc posteriorly and a minimal bulge, and advanced bilateral facet arthropathy with ligamentum flavum thickening resulting in moderate spinal canal stenosis with bilateral subarticular zone narrowing and no significant neural foraminal stenosis L4-L5: There is grade 1 anterolisthesis with uncovering of the disc posteriorly and advanced bilateral facet arthropathy resulting in moderate spinal canal stenosis with bilateral subarticular zone narrowing and no  significant neural foraminal stenosis L5-S1: There is a shallow left subarticular zone protrusion and annular fissure and advanced bilateral facet arthropathy but no significant spinal canal or neural foraminal stenosis. IMPRESSION: 1. 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 No appreciable  epidural tumor within the spinal canal. Recommend oncologic workup. 2. Mild compression deformity of the superior T12 endplate with approximately 10% loss of vertebral body height and no bony retropulsion consistent with pathologic fracture, possibly acute. 3. Advanced bilateral facet arthropathy at L2-L3 through L5-S1 with moderate spinal canal stenosis and bilateral subarticular zone narrowing at L2-L3 through L4-L5 4. 3.6 cm gallstone. The common bile duct is dilated measuring up to 9 mm with tapering at the level of the ampulla. Correlate with LFTs and consider dedicated imaging as indicated. 5. 2.1 cm T2 hyperintense lesion in the left pelvis abutting the medial aspect of the psoas muscle is indeterminate and may reflect an ovarian cyst, lymphocele, or inclusion cyst. Recommend attention on follow-up imaging. Electronically Signed   By: Lesia Hausen M.D.   On: 09/20/2022 14:54      IMPRESSION/PLAN: Bone metastases throughout the spine, status post decompressive surgery for T7 cord compression  Today, I talked to the patient about the findings and work-up thus far.  We discussed the patient's diagnosis of bone metastases to the spine and general treatment for this, highlighting the role of radiotherapy in the management.  We discussed the available radiation techniques, and focused on the details of logistics and delivery.    After reviewing her imaging the most substantial disease in her spine appears to be from T3-T12.  At this point in time I recommend that she pursue 30 Gray in 10 fractions, delivered over 2 weeks, to that portion of her thoracic spine.  We discussed that if other lesions are detected in her spine in the future that are severe in nature, she may require modification of her plan or future radiation to other bony sites.  We talked about the importance of being judicious in terms of how much of the spine is treated at once so that she does not undergo severe bone marrow suppression or other  acute side effects.  We discussed the risks, benefits, and side effects of radiotherapy. Side effects may include but not necessarily be limited to: Skin irritation, esophagitis, fatigue, nausea, diarrhea, irritation to the internal organs.  We discussed the risk of late toxicity to thoracic and abdominal organs as well, including possible irritation of pelvic organs if she ends up needing radiation lower in her spine.  No guarantees of treatment were given. A consent form was signed and placed in the patient's medical record.  We will proceed with treatment planning today and start her treatment on May 6.  The patient was encouraged to ask questions that I answered to the best of my ability.  She is enthusiastic to proceed.  Staples will be removed from her back at the neurosurgical follow-up next week.   On date of service, in total, I spent 45 minutes on this encounter. Patient was seen in person.   __________________________________________   Lonie Peak, MD  This document serves as a record of services personally performed by Lonie Peak, MD. It was created on her behalf by Neena Rhymes, a trained medical scribe. The creation of this record is based on the scribe's personal observations and the  provider's statements to them. This document has been checked and approved by the attending provider.  

## 2022-10-04 ENCOUNTER — Ambulatory Visit
Admission: RE | Admit: 2022-10-04 | Discharge: 2022-10-04 | Disposition: A | Discharge status: Home / Self Care | Payer: PPO | Source: Ambulatory Visit | Attending: Radiation Oncology | Admitting: Radiation Oncology

## 2022-10-04 ENCOUNTER — Encounter: Payer: Self-pay | Admitting: Radiation Oncology

## 2022-10-04 ENCOUNTER — Telehealth: Payer: Self-pay | Admitting: Internal Medicine

## 2022-10-04 VITALS — BP 121/70 | HR 78 | Temp 97.0°F | Resp 18 | Ht 63.0 in | Wt 159.0 lb

## 2022-10-04 DIAGNOSIS — Z87891 Personal history of nicotine dependence: Secondary | ICD-10-CM | POA: Diagnosis not present

## 2022-10-04 DIAGNOSIS — Z800 Family history of malignant neoplasm of digestive organs: Secondary | ICD-10-CM | POA: Diagnosis not present

## 2022-10-04 DIAGNOSIS — C7951 Secondary malignant neoplasm of bone: Secondary | ICD-10-CM | POA: Insufficient documentation

## 2022-10-04 DIAGNOSIS — I6782 Cerebral ischemia: Secondary | ICD-10-CM | POA: Diagnosis not present

## 2022-10-04 DIAGNOSIS — K5730 Diverticulosis of large intestine without perforation or abscess without bleeding: Secondary | ICD-10-CM | POA: Insufficient documentation

## 2022-10-04 DIAGNOSIS — I10 Essential (primary) hypertension: Secondary | ICD-10-CM | POA: Insufficient documentation

## 2022-10-04 DIAGNOSIS — G9529 Other cord compression: Secondary | ICD-10-CM

## 2022-10-04 DIAGNOSIS — J439 Emphysema, unspecified: Secondary | ICD-10-CM | POA: Insufficient documentation

## 2022-10-04 DIAGNOSIS — C3431 Malignant neoplasm of lower lobe, right bronchus or lung: Secondary | ICD-10-CM | POA: Diagnosis not present

## 2022-10-04 DIAGNOSIS — Z79899 Other long term (current) drug therapy: Secondary | ICD-10-CM | POA: Diagnosis not present

## 2022-10-04 DIAGNOSIS — I700 Atherosclerosis of aorta: Secondary | ICD-10-CM | POA: Diagnosis not present

## 2022-10-04 DIAGNOSIS — C787 Secondary malignant neoplasm of liver and intrahepatic bile duct: Secondary | ICD-10-CM | POA: Insufficient documentation

## 2022-10-04 NOTE — Telephone Encounter (Signed)
Scheduled per 04/24 los, patient has been called and notified.  ?

## 2022-10-08 ENCOUNTER — Encounter (HOSPITAL_COMMUNITY): Payer: Self-pay | Admitting: Internal Medicine

## 2022-10-09 DIAGNOSIS — R918 Other nonspecific abnormal finding of lung field: Secondary | ICD-10-CM | POA: Diagnosis not present

## 2022-10-09 DIAGNOSIS — M8448XD Pathological fracture, other site, subsequent encounter for fracture with routine healing: Secondary | ICD-10-CM | POA: Diagnosis not present

## 2022-10-09 DIAGNOSIS — I10 Essential (primary) hypertension: Secondary | ICD-10-CM | POA: Diagnosis not present

## 2022-10-09 DIAGNOSIS — Z510 Encounter for antineoplastic radiation therapy: Secondary | ICD-10-CM | POA: Diagnosis not present

## 2022-10-09 DIAGNOSIS — G9520 Unspecified cord compression: Secondary | ICD-10-CM | POA: Diagnosis not present

## 2022-10-09 DIAGNOSIS — C3491 Malignant neoplasm of unspecified part of right bronchus or lung: Secondary | ICD-10-CM | POA: Diagnosis not present

## 2022-10-09 DIAGNOSIS — D492 Neoplasm of unspecified behavior of bone, soft tissue, and skin: Secondary | ICD-10-CM | POA: Diagnosis not present

## 2022-10-09 DIAGNOSIS — C7951 Secondary malignant neoplasm of bone: Secondary | ICD-10-CM | POA: Insufficient documentation

## 2022-10-09 DIAGNOSIS — K8000 Calculus of gallbladder with acute cholecystitis without obstruction: Secondary | ICD-10-CM | POA: Diagnosis not present

## 2022-10-09 DIAGNOSIS — E039 Hypothyroidism, unspecified: Secondary | ICD-10-CM | POA: Diagnosis not present

## 2022-10-09 DIAGNOSIS — G9529 Other cord compression: Secondary | ICD-10-CM | POA: Diagnosis not present

## 2022-10-09 DIAGNOSIS — Z4789 Encounter for other orthopedic aftercare: Secondary | ICD-10-CM | POA: Diagnosis not present

## 2022-10-09 DIAGNOSIS — C3431 Malignant neoplasm of lower lobe, right bronchus or lung: Secondary | ICD-10-CM | POA: Diagnosis not present

## 2022-10-09 DIAGNOSIS — R29898 Other symptoms and signs involving the musculoskeletal system: Secondary | ICD-10-CM | POA: Diagnosis not present

## 2022-10-11 ENCOUNTER — Encounter (HOSPITAL_COMMUNITY): Payer: Self-pay

## 2022-10-14 ENCOUNTER — Ambulatory Visit
Admission: RE | Admit: 2022-10-14 | Discharge: 2022-10-14 | Disposition: A | Discharge status: Home / Self Care | Payer: PPO | Source: Ambulatory Visit | Attending: Radiation Oncology | Admitting: Radiation Oncology

## 2022-10-14 ENCOUNTER — Ambulatory Visit: Payer: PPO

## 2022-10-14 ENCOUNTER — Other Ambulatory Visit: Payer: Self-pay

## 2022-10-14 DIAGNOSIS — C3431 Malignant neoplasm of lower lobe, right bronchus or lung: Secondary | ICD-10-CM | POA: Diagnosis not present

## 2022-10-14 DIAGNOSIS — Z510 Encounter for antineoplastic radiation therapy: Secondary | ICD-10-CM | POA: Diagnosis not present

## 2022-10-14 DIAGNOSIS — G9529 Other cord compression: Secondary | ICD-10-CM | POA: Diagnosis not present

## 2022-10-14 DIAGNOSIS — C7951 Secondary malignant neoplasm of bone: Secondary | ICD-10-CM | POA: Diagnosis not present

## 2022-10-14 LAB — RAD ONC ARIA SESSION SUMMARY
Course Elapsed Days: 0
Plan Fractions Treated to Date: 1
Plan Prescribed Dose Per Fraction: 3 Gy
Plan Total Fractions Prescribed: 10
Plan Total Prescribed Dose: 30 Gy
Reference Point Dosage Given to Date: 3 Gy
Reference Point Session Dosage Given: 3 Gy
Session Number: 1

## 2022-10-15 ENCOUNTER — Ambulatory Visit
Admission: RE | Admit: 2022-10-15 | Discharge: 2022-10-15 | Disposition: A | Discharge status: Home / Self Care | Payer: PPO | Source: Ambulatory Visit | Attending: Radiation Oncology | Admitting: Radiation Oncology

## 2022-10-15 ENCOUNTER — Other Ambulatory Visit: Payer: Self-pay

## 2022-10-15 ENCOUNTER — Telehealth: Payer: Self-pay | Admitting: Medical Oncology

## 2022-10-15 DIAGNOSIS — C3431 Malignant neoplasm of lower lobe, right bronchus or lung: Secondary | ICD-10-CM | POA: Diagnosis not present

## 2022-10-15 DIAGNOSIS — Z510 Encounter for antineoplastic radiation therapy: Secondary | ICD-10-CM | POA: Diagnosis not present

## 2022-10-15 DIAGNOSIS — C7951 Secondary malignant neoplasm of bone: Secondary | ICD-10-CM | POA: Diagnosis not present

## 2022-10-15 DIAGNOSIS — G9529 Other cord compression: Secondary | ICD-10-CM | POA: Diagnosis not present

## 2022-10-15 DIAGNOSIS — C3492 Malignant neoplasm of unspecified part of left bronchus or lung: Secondary | ICD-10-CM | POA: Diagnosis not present

## 2022-10-15 LAB — RAD ONC ARIA SESSION SUMMARY
Course Elapsed Days: 1
Plan Fractions Treated to Date: 2
Plan Prescribed Dose Per Fraction: 3 Gy
Plan Total Fractions Prescribed: 10
Plan Total Prescribed Dose: 30 Gy
Reference Point Dosage Given to Date: 6 Gy
Reference Point Session Dosage Given: 3 Gy
Session Number: 2

## 2022-10-15 NOTE — Telephone Encounter (Signed)
Bilateral legs swelling R > L -. I instructed Kerilyn to contact PCP.  She is not getting chemo/immuno . She has had 2 days of radiation.

## 2022-10-15 NOTE — Progress Notes (Signed)
[facility] OFFICE PROGRESS NOTE  [name] [address] [place] [address]  DIAGNOSIS: stage IV (T1b, N2, M1 C) non-small cell lung cancer, adenocarcinoma presented with right lower lobe lung nodule in addition to small bilateral pulmonary nodules and right hilar and mediastinal lymphadenopathy in addition to liver and bone metastasis diagnosed in April 2024. 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  PDL1: 20%  Guardant 360: no actionable mutations.   PRIOR THERAPY: Decompressive thoracic laminectomy of the T7 with spinal cord decompression and biopsy of the epidural mass in April 2024.   CURRENT THERAPY: 1) Palliative radiation to the bone under the care of Dr. Basilio Cairo. Last day scheduled for 10/25/22 2) carboplatin for an AUC of 5, Alimta 500 mg/m, Keytruda 200 mg IV every 3 weeks.  First dose expected on 10/28/2022.  INTERVAL HISTORY: Hayley Wu 72 y.o. female returns to the clinic today for a follow-up visit accompanied by her sister and niece.  The patient first was seen in the hospital after presenting for back pain.  She followed up outpatient on 10/01/2022 and establish care for her newly diagnosed stage IV non-small cell lung cancer.  In the interval since last being seen, the patient has been receiving palliative radiation to the metastatic bone lesions.  She also completed the staging workup with a PET scan.  She also had molecular studies performed to see what systemic treatment options she is a candidate for.  Since last being seen she denies any major changes in her health.  She denies any fever, chills, night sweats, or unexplained weight loss.  She denies any chest pain, shortness of breath, cough, or hemoptysis.  Has been having some ongoing concerns with weakness in the legs for which she is undergoing physical therapy 2x per week.  She  denies any nausea, vomiting, diarrhea, or constipation.  Denies any headache or visual changes.  She takes muscle relaxer and ibuprofen for her back pain.  She is here today for evaluation and to review all of her scans and molecular studies for more a detailed discussion about her current condition and recommended  treatment options.   MEDICAL HISTORY: Past Medical History:  Diagnosis Date   Allergy    Contact lens/glasses fitting    Hypertension     ALLERGIES:  is allergic to erythromycin, amoxicillin, latex, and prednisone.  MEDICATIONS:  Current Outpatient Medications  Medication Sig Dispense Refill   folic acid (FOLVITE) 1 MG tablet Take 1 tablet (1 mg total) by mouth daily. 30 tablet 2   prochlorperazine (COMPAZINE) 10 MG tablet Take 1 tablet (10 mg total) by mouth every 6 (six) hours as needed. 30 tablet 2   acetaminophen (TYLENOL) 500 MG tablet Take 500 mg by mouth every 6 (six) hours as needed for moderate pain.     amLODipine (NORVASC) 10 MG tablet Take 10 mg by mouth daily.     celecoxib (CELEBREX) 200 MG capsule Take 1 capsule (200 mg total) by mouth every 12 (twelve) hours. 60 capsule 0   levothyroxine (SYNTHROID) 100 MCG tablet Take 1 tablet (100 mcg total) by mouth daily at 6 (six) AM. 30 tablet 2   losartan (COZAAR) 100 MG tablet Take 100 mg by mouth daily.     methocarbamol (ROBAXIN) 500 MG tablet Take 1 tablet (500 mg total) by mouth every 6 (six) hours as needed  for muscle spasms. 60 tablet 0   oxyCODONE (OXY IR/ROXICODONE) 5 MG immediate release tablet Take 1 tablet (5 mg total) by mouth every 3 (three) hours as needed for moderate pain ((score 4 to 6)). 30 tablet 0   polyethylene glycol (MIRALAX / GLYCOLAX) 17 g packet Take 17 g by mouth 2 (two) times daily. 14 each 0   senna (SENOKOT) 8.6 MG TABS tablet Take 1 tablet (8.6 mg total) by mouth 2 (two) times daily. 120 tablet 0   tamsulosin (FLOMAX) 0.4 MG CAPS capsule Take 1 capsule (0.4 mg total) by mouth daily. 30  capsule 0   VITAMIN D PO Take 1 tablet by mouth daily.     No current facility-administered medications for this visit.   Facility-Administered Medications Ordered in Other Visits  Medication Dose Route Frequency Provider Last Rate Last Admin   cyanocobalamin (VITAMIN B12) injection 1,000 mcg  1,000 mcg Intramuscular Once Shauntell Iglesia L, PA-C        SURGICAL HISTORY:  Past Surgical History:  Procedure Laterality Date   ABDOMINAL HYSTERECTOMY     BREAST SURGERY  2010   rt lumpectomy-neg   LAMINECTOMY N/A 09/21/2022   Procedure: THORACIC LAMINECTOMY FOR TUMOR;  Surgeon: Tia Alert, MD;  Location: [facility] OR;  Service: Neurosurgery;  Laterality: N/A;   OPEN REDUCTION INTERNAL FIXATION (ORIF) DISTAL RADIAL FRACTURE Left 11/26/2013   Procedure: OPEN REDUCTION INTERNAL FIXATION (ORIF) LEFT  DISTAL RADIUS ;  Surgeon: Nestor Lewandowsky, MD;  Location: [facility];  Service: Orthopedics;  Laterality: Left;  with block   TUBAL LIGATION      REVIEW OF SYSTEMS:   Review of Systems  Constitutional: Negative for appetite change, chills, fatigue, fever and unexpected weight change.  HENT: Negative for mouth sores, nosebleeds, sore throat and trouble swallowing.   Eyes: Negative for eye problems and icterus.  Respiratory: Negative for cough, hemoptysis, shortness of breath and wheezing.   Cardiovascular: Negative for chest pain and leg swelling.  Gastrointestinal: Negative for abdominal pain, constipation, diarrhea, nausea and vomiting.  Genitourinary: Negative for bladder incontinence, difficulty urinating, dysuria, frequency and hematuria.   Musculoskeletal: Positive for back pain.  Negative for back pain, gait problem, neck pain and neck stiffness.  Skin: Negative for itching and rash.  Neurological: Positive for weakness in the legs bilaterally.  Negative for dizziness, gait problem, headaches, light-headedness and seizures.  Hematological: Negative for adenopathy. Does not  bruise/bleed easily.  Psychiatric/Behavioral: Negative for confusion, depression and sleep disturbance. The patient is not nervous/anxious.     PHYSICAL EXAMINATION:  Blood pressure 125/71, pulse 98, temperature 97.8 F (36.6 C), temperature source Oral, resp. rate 18, height 5\' 3"  (1.6 m), weight 163 lb (73.9 kg), SpO2 100 %.  ECOG PERFORMANCE STATUS: 2-3  Physical Exam  Constitutional: Oriented to person, place, and time and well-developed, well-nourished, and in no distress.   HENT:  Head: Normocephalic and atraumatic.  Mouth/Throat: Oropharynx is clear and moist. No oropharyngeal exudate.  Eyes: Conjunctivae are normal. Right eye exhibits no discharge. Left eye exhibits no discharge. No scleral icterus.  Neck: Normal range of motion. Neck supple.  Cardiovascular: Normal rate, regular rhythm, normal heart sounds and intact distal pulses.   Pulmonary/Chest: Effort normal and breath sounds normal. No respiratory distress. No wheezes. No rales.  Abdominal: Soft. Bowel sounds are normal. Exhibits no distension and no mass. There is no tenderness.  Musculoskeletal: Normal range of motion. Exhibits no edema.  Lymphadenopathy:    No cervical adenopathy.  Neurological: Alert and oriented to person, place, and time.  Exhibits muscle wasting.  Is examined in the wheelchair.  Skin: Skin is warm and dry. No rash noted. Not diaphoretic. No erythema. No pallor.  Psychiatric: Mood, memory and judgment normal.  Vitals reviewed.  LABORATORY DATA: Lab Results  Component Value Date   WBC 6.5 10/21/2022   HGB 15.0 10/21/2022   HCT 45.8 10/21/2022   MCV 90.9 10/21/2022   PLT 190 10/21/2022      Chemistry      Component Value Date/Time   NA 142 10/21/2022 1019   K 3.9 10/21/2022 1019   CL 104 10/21/2022 1019   CO2 31 10/21/2022 1019   BUN 12 10/21/2022 1019   CREATININE 0.61 10/21/2022 1019      Component Value Date/Time   CALCIUM 9.5 10/21/2022 1019   ALKPHOS 159 (H) 10/21/2022 1019    AST 12 (L) 10/21/2022 1019   ALT 11 10/21/2022 1019   BILITOT 0.6 10/21/2022 1019       RADIOGRAPHIC STUDIES:  NM PET Image Initial (PI) Skull Base To Thigh (F-18 FDG)  Result Date: 10/21/2022 CLINICAL DATA:  Initial treatment strategy for non-small cell lung cancer. EXAM: NUCLEAR MEDICINE PET SKULL BASE TO THIGH TECHNIQUE: 8.0 mCi F-18 FDG was injected intravenously. Full-ring PET imaging was performed from the skull base to thigh after the radiotracer. CT data was obtained and used for attenuation correction and anatomic localization. Fasting blood glucose: 114 mg/dl COMPARISON:  CT scan 09/60/4540 FINDINGS: Mediastinal blood pool activity: SUV max 1.96 Liver activity: SUV max NA NECK: No hypermetabolic lymph nodes in the neck. Incidental CT findings: None. CHEST: The dominant right lower lobe pulmonary nodule is hypermetabolic with SUV max of 2.87 and consistent pulmonary neoplasm. Hypermetabolic right hilar and mediastinal nodes consistent with metastatic disease. Right hilar node has an SUV max of 3.1. Right-sided subcarinal node has an SUV max of 5.53. Pretracheal lymph node has an SUV max of 3.23 Other small pulmonary nodules are mildly hypermetabolic and consistent with pulmonary metastatic disease. 5 mm right upper lobe nodule has an SUV max of 1.64. No hypermetabolic breast masses, supraclavicular or axillary adenopathy. Incidental CT findings: Advanced emphysematous changes and pulmonary scarring. Small pleural effusions. Aortic and coronary artery calcifications are noted. ABDOMEN/PELVIS: Scattered low-attenuation liver lesions are again demonstrated. Some of these are clearly benign cysts but others are mildly hypermetabolic and consistent with hepatic metastatic disease. Segment 5 lesion has an SUV max of 3.62. Segment 6 lesion has an SUV max of 3.82. Other smaller lesions are indeterminate. No findings for adrenal gland metastasis. No abdominopelvic lymphadenopathy. Incidental CT findings:  Advanced vascular disease but no aneurysm. Cholelithiasis. SKELETON: Diffuse hypermetabolic metastatic bone disease. T4 vertebral body lesion has an SUV max of 6.53. There is also a lesion in the left pedicle and lamina area with SUV max of 6.15. T6 lesion has an SUV max of 7.80 and is also involvement the posterior elements bilaterally and evidence of prior postsurgical changes. T7 lesion has an SUV max of 7.68. Left acetabular lesion has an SUV max of 4.01. Left hip lesion has an SUV max of 4.13. Right scapular tip lesion has an SUV max of 3.22 Incidental CT findings: None. IMPRESSION: 1. Hypermetabolic right lower lobe pulmonary nodule consistent with pulmonary neoplasm. 2. Hypermetabolic right hilar and mediastinal lymphadenopathy. 3. Pulmonary metastatic disease. 4. Hepatic metastatic disease. 5. Diffuse hypermetabolic osseous metastatic disease. Aortic Atherosclerosis (ICD10-I70.0). Electronically Signed   By: Orlene Plum.D.  On: 10/21/2022 10:18   MR BRAIN WO CONTRAST  Result Date: 09/22/2022 CLINICAL DATA:  Initial evaluation for metastatic disease. EXAM: MRI HEAD WITHOUT CONTRAST TECHNIQUE: Multiplanar, multiecho pulse sequences of the brain and surrounding structures were obtained without intravenous contrast. COMPARISON:  Comparison made with prior studies from 09/20/2022 and earlier. FINDINGS: Brain: Cerebral volume within normal limits for age. Scattered patchy T2/FLAIR hyperintensity involving the periventricular, deep, and subcortical white matter both cerebral hemispheres, nonspecific, but most commonly related to chronic microvascular ischemic disease. Overall, changes are moderately advanced in nature. No evidence for acute or subacute ischemia. Gray-white matter differentiation maintained. No areas of chronic cortical infarction. No acute intracranial hemorrhage. Few punctate chronic micro hemorrhages noted about the cerebellum and left cerebral hemisphere, likely small vessel related. No  mass lesion, midline shift or mass effect. No hydrocephalus or extra-axial fluid collection. Pituitary gland and suprasellar region within normal limits. No evidence for intracranial metastatic disease on this noncontrast examination. Vascular: Major intracranial vascular flow voids are well maintained. Skull and upper cervical spine: Craniocervical junction within normal limits. Bone marrow signal intensity normal. No visible focal marrow replacing lesion. No scalp soft tissue abnormality. Sinuses/Orbits: Globes and orbital soft tissues within normal limits. Paranasal sinuses are largely clear. Small bilateral mastoid effusions noted, of doubtful significance. Visualized nasopharynx unremarkable. Other: None. IMPRESSION: 1. No acute intracranial abnormality. No evidence for intracranial metastatic disease on this noncontrast examination. 2. Moderately advanced chronic microvascular ischemic disease. Electronically Signed   By: Rise Mu M.D.   On: 09/22/2022 20:43     ASSESSMENT/PLAN:  This is a very pleasant 72 year old Caucasian female recently diagnosed with stage IV (T1b, N2, M1 C) non-small cell lung cancer, adenocarcinoma presented with right lower lobe lung nodule in addition to small bilateral pulmonary nodules and right hilar and mediastinal lymphadenopathy in addition to liver and bone metastasis diagnosed in April 2024. 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 Her PDL1 expression is 20%. She has no actionable mutations.  She is currently undergoing palliative radiation to the painful metastatic bone lesions under the care of Dr. Basilio Cairo in the last day radiation is tentatively scheduled for 10/25/2022.  The patient recently had a PET scan.  The patient was seen with Dr. Arbutus Ped today.  Dr. Arbutus Ped personally and independently reviewed the scan and discussed the results with the patient  today.  The scan showed known right lower lobe pulmonary nodule, right hilar and mediastinal lymphadenopathy, pulmonary metastatic disease, and hepatic metastatic disease and diffuse hypermetabolic osseous metastatic disease.  Dr. Arbutus Ped had a lengthly discussion with the patient today about her current condition and treatment options. The patient was given the option of a referral to hospice/palliative vs. Treatment with systemic chemotherapy with carboplatin for an AUC of 5, Alimta 500 mg/m, and Keytruda 200 mg IV every 3 weeks.  The patient is interested in proceeding with systemic chemotherapy.  She is expected to start her first dose of this treatment on 10/28/22  We discussed the adverse side effects of treatment including but not limited to alopecia, myelosuppression, nausea and vomiting, peripheral neuropathy, liver or renal dysfunction as well as immunotherapy mediated adverse effects.   I will arrange for the patient to have a chemoeducation class prior to receiving her first cycle of chemotherapy.   We will arrange for the patient to have a B12 injection while in the clinic today.  I sent prescriptions for 1 mg folic acid p.o. daily as well as Compazine 10 mg every 6 hours as needed for nausea.   The patient will follow-up in 2 weeks for a one-week follow-up visit after completing her first cycle of chemotherapy.  I refilled her Synthroid.  Discussed the importance of continuing physical therapy and doing the exercises at home on her own time to help with the weakness in her legs.  The patient was advised to call immediately if she has any concerning symptoms in the interval. The patient voices understanding of current disease status and treatment options and is in agreement with the current care plan. All questions were answered. The patient knows to call the clinic with any problems, questions or concerns. We can certainly see the patient much sooner if necessary  No orders of  the defined types were placed in this encounter.    Tedi Hughson L Laasya Peyton, PA-C 10/21/22  ADDENDUM:: Hematology/Oncology Attending: I had a face-to-face encounter with the patient today.  I reviewed her records, scan and recommended her care plan.  This is a very pleasant 72 years old white female with a stage IV non-small cell lung cancer, adenocarcinoma diagnosed in April 2024 status post decompression thoracic laminectomy of the T7 with a spinal cord decompression and biopsy of the epidural mass.  Her molecular studies showed no actionable mutations and her PD-L1 expression is 20%. The patient also underwent palliative radiotherapy to the metastatic bone lesion under the care of Dr. Basilio Cairo and she will complete this course on 10/25/2022. I had a lengthy discussion with the patient and her sister and niece today about her current condition and treatment options. Unfortunately patient is not a candidate for any treatment with targeted therapy because of the absence of any actionable mutations. She was given the option of palliative care versus palliative systemic chemotherapy in combination with immunotherapy.  She is interested in treatment and I will treat her with a course of carboplatin for AUC of 5, Alimta 500 Mg/M2 and Keytruda 200 Mg IV every 3 weeks.  We discussed with the patient the adverse effect of this treatment including but not limited to alopecia, myelosuppression, nausea and vomiting, peripheral neuropathy, liver or renal dysfunction. She is expected to start the first cycle of this treatment next week. The patient will receive vitamin B12 injection today.  Will also call her pharmacy with prescription for Compazine 10 mg p.o. every 6 hours as needed for nausea and folic acid 1 mg p.o. daily. She will come back for follow-up visit in 2 weeks for evaluation and management of any adverse effect of her treatment. The patient was advised to call immediately if she has any concerning  symptoms in the interval. The total time spent in the appointment was 30 minutes. Disclaimer: This note was dictated with voice recognition software. Similar sounding words can inadvertently be transcribed and may be missed upon review. Lajuana Matte, MD

## 2022-10-16 ENCOUNTER — Other Ambulatory Visit: Payer: Self-pay

## 2022-10-16 ENCOUNTER — Ambulatory Visit
Admission: RE | Admit: 2022-10-16 | Discharge: 2022-10-16 | Disposition: A | Discharge status: Home / Self Care | Payer: PPO | Source: Ambulatory Visit | Attending: Radiation Oncology | Admitting: Radiation Oncology

## 2022-10-16 DIAGNOSIS — C7951 Secondary malignant neoplasm of bone: Secondary | ICD-10-CM | POA: Diagnosis not present

## 2022-10-16 DIAGNOSIS — C3431 Malignant neoplasm of lower lobe, right bronchus or lung: Secondary | ICD-10-CM | POA: Diagnosis not present

## 2022-10-16 DIAGNOSIS — G9529 Other cord compression: Secondary | ICD-10-CM | POA: Diagnosis not present

## 2022-10-16 DIAGNOSIS — Z510 Encounter for antineoplastic radiation therapy: Secondary | ICD-10-CM | POA: Diagnosis not present

## 2022-10-16 LAB — RAD ONC ARIA SESSION SUMMARY
Course Elapsed Days: 2
Plan Fractions Treated to Date: 3
Plan Prescribed Dose Per Fraction: 3 Gy
Plan Total Fractions Prescribed: 10
Plan Total Prescribed Dose: 30 Gy
Reference Point Dosage Given to Date: 9 Gy
Reference Point Session Dosage Given: 3 Gy
Session Number: 3

## 2022-10-17 ENCOUNTER — Other Ambulatory Visit: Payer: Self-pay

## 2022-10-17 ENCOUNTER — Ambulatory Visit
Admission: RE | Admit: 2022-10-17 | Discharge: 2022-10-17 | Disposition: A | Discharge status: Home / Self Care | Payer: PPO | Source: Ambulatory Visit | Attending: Radiation Oncology | Admitting: Radiation Oncology

## 2022-10-17 ENCOUNTER — Encounter: Payer: Self-pay | Admitting: Internal Medicine

## 2022-10-17 ENCOUNTER — Encounter (HOSPITAL_COMMUNITY)
Admission: RE | Admit: 2022-10-17 | Discharge: 2022-10-17 | Disposition: A | Discharge status: Home / Self Care | Payer: PPO | Source: Ambulatory Visit | Attending: Internal Medicine | Admitting: Internal Medicine

## 2022-10-17 DIAGNOSIS — G9529 Other cord compression: Secondary | ICD-10-CM | POA: Diagnosis not present

## 2022-10-17 DIAGNOSIS — C7951 Secondary malignant neoplasm of bone: Secondary | ICD-10-CM | POA: Diagnosis not present

## 2022-10-17 DIAGNOSIS — C3431 Malignant neoplasm of lower lobe, right bronchus or lung: Secondary | ICD-10-CM | POA: Diagnosis not present

## 2022-10-17 DIAGNOSIS — Z510 Encounter for antineoplastic radiation therapy: Secondary | ICD-10-CM | POA: Diagnosis not present

## 2022-10-17 DIAGNOSIS — C3490 Malignant neoplasm of unspecified part of unspecified bronchus or lung: Secondary | ICD-10-CM | POA: Diagnosis not present

## 2022-10-17 LAB — RAD ONC ARIA SESSION SUMMARY
Course Elapsed Days: 3
Plan Fractions Treated to Date: 4
Plan Prescribed Dose Per Fraction: 3 Gy
Plan Total Fractions Prescribed: 10
Plan Total Prescribed Dose: 30 Gy
Reference Point Dosage Given to Date: 12 Gy
Reference Point Session Dosage Given: 3 Gy
Session Number: 4

## 2022-10-17 LAB — GUARDANT 360

## 2022-10-17 LAB — GLUCOSE, CAPILLARY: Glucose-Capillary: 114 mg/dL — ABNORMAL HIGH (ref 70–99)

## 2022-10-17 MED ORDER — FLUDEOXYGLUCOSE F - 18 (FDG) INJECTION
7.9000 | Freq: Once | INTRAVENOUS | Status: AC
  Administered 2022-10-17: 8 via INTRAVENOUS

## 2022-10-18 ENCOUNTER — Other Ambulatory Visit: Payer: Self-pay | Admitting: Medical Oncology

## 2022-10-18 ENCOUNTER — Other Ambulatory Visit: Payer: Self-pay

## 2022-10-18 ENCOUNTER — Ambulatory Visit
Admission: RE | Admit: 2022-10-18 | Discharge: 2022-10-18 | Disposition: A | Discharge status: Home / Self Care | Payer: PPO | Source: Ambulatory Visit | Attending: Radiation Oncology | Admitting: Radiation Oncology

## 2022-10-18 DIAGNOSIS — Z510 Encounter for antineoplastic radiation therapy: Secondary | ICD-10-CM | POA: Diagnosis not present

## 2022-10-18 DIAGNOSIS — G9529 Other cord compression: Secondary | ICD-10-CM | POA: Diagnosis not present

## 2022-10-18 DIAGNOSIS — C7951 Secondary malignant neoplasm of bone: Secondary | ICD-10-CM | POA: Diagnosis not present

## 2022-10-18 DIAGNOSIS — C3431 Malignant neoplasm of lower lobe, right bronchus or lung: Secondary | ICD-10-CM | POA: Diagnosis not present

## 2022-10-18 DIAGNOSIS — C3490 Malignant neoplasm of unspecified part of unspecified bronchus or lung: Secondary | ICD-10-CM

## 2022-10-18 LAB — RAD ONC ARIA SESSION SUMMARY
Course Elapsed Days: 4
Plan Fractions Treated to Date: 5
Plan Prescribed Dose Per Fraction: 3 Gy
Plan Total Fractions Prescribed: 10
Plan Total Prescribed Dose: 30 Gy
Reference Point Dosage Given to Date: 15 Gy
Reference Point Session Dosage Given: 3 Gy
Session Number: 5

## 2022-10-21 ENCOUNTER — Ambulatory Visit: Payer: PPO | Admitting: Physician Assistant

## 2022-10-21 ENCOUNTER — Ambulatory Visit
Admission: RE | Admit: 2022-10-21 | Discharge: 2022-10-21 | Disposition: A | Discharge status: Home / Self Care | Payer: PPO | Source: Ambulatory Visit | Attending: Radiation Oncology | Admitting: Radiation Oncology

## 2022-10-21 ENCOUNTER — Other Ambulatory Visit: Payer: Self-pay | Admitting: Radiation Oncology

## 2022-10-21 ENCOUNTER — Ambulatory Visit: Payer: PPO

## 2022-10-21 ENCOUNTER — Inpatient Hospital Stay: Payer: PPO | Attending: Internal Medicine

## 2022-10-21 ENCOUNTER — Inpatient Hospital Stay (HOSPITAL_BASED_OUTPATIENT_CLINIC_OR_DEPARTMENT_OTHER): Payer: PPO | Admitting: Physician Assistant

## 2022-10-21 ENCOUNTER — Other Ambulatory Visit: Payer: Self-pay

## 2022-10-21 ENCOUNTER — Encounter: Payer: Self-pay | Admitting: Internal Medicine

## 2022-10-21 ENCOUNTER — Inpatient Hospital Stay: Payer: PPO

## 2022-10-21 ENCOUNTER — Other Ambulatory Visit: Payer: PPO

## 2022-10-21 VITALS — BP 125/71 | HR 98 | Temp 97.8°F | Resp 18 | Ht 63.0 in | Wt 163.0 lb

## 2022-10-21 DIAGNOSIS — C3431 Malignant neoplasm of lower lobe, right bronchus or lung: Secondary | ICD-10-CM | POA: Diagnosis not present

## 2022-10-21 DIAGNOSIS — C3490 Malignant neoplasm of unspecified part of unspecified bronchus or lung: Secondary | ICD-10-CM

## 2022-10-21 DIAGNOSIS — C3491 Malignant neoplasm of unspecified part of right bronchus or lung: Secondary | ICD-10-CM | POA: Diagnosis not present

## 2022-10-21 DIAGNOSIS — Z5111 Encounter for antineoplastic chemotherapy: Secondary | ICD-10-CM | POA: Insufficient documentation

## 2022-10-21 DIAGNOSIS — Z7189 Other specified counseling: Secondary | ICD-10-CM | POA: Insufficient documentation

## 2022-10-21 DIAGNOSIS — Z5112 Encounter for antineoplastic immunotherapy: Secondary | ICD-10-CM | POA: Insufficient documentation

## 2022-10-21 DIAGNOSIS — Z923 Personal history of irradiation: Secondary | ICD-10-CM | POA: Insufficient documentation

## 2022-10-21 DIAGNOSIS — C7951 Secondary malignant neoplasm of bone: Secondary | ICD-10-CM | POA: Diagnosis not present

## 2022-10-21 DIAGNOSIS — Z79890 Hormone replacement therapy (postmenopausal): Secondary | ICD-10-CM | POA: Insufficient documentation

## 2022-10-21 DIAGNOSIS — G9529 Other cord compression: Secondary | ICD-10-CM | POA: Diagnosis not present

## 2022-10-21 DIAGNOSIS — Z79620 Long term (current) use of immunosuppressive biologic: Secondary | ICD-10-CM | POA: Insufficient documentation

## 2022-10-21 DIAGNOSIS — K5900 Constipation, unspecified: Secondary | ICD-10-CM | POA: Insufficient documentation

## 2022-10-21 DIAGNOSIS — M549 Dorsalgia, unspecified: Secondary | ICD-10-CM | POA: Insufficient documentation

## 2022-10-21 DIAGNOSIS — C787 Secondary malignant neoplasm of liver and intrahepatic bile duct: Secondary | ICD-10-CM | POA: Insufficient documentation

## 2022-10-21 DIAGNOSIS — Z510 Encounter for antineoplastic radiation therapy: Secondary | ICD-10-CM | POA: Diagnosis not present

## 2022-10-21 LAB — RAD ONC ARIA SESSION SUMMARY
Course Elapsed Days: 7
Plan Fractions Treated to Date: 6
Plan Prescribed Dose Per Fraction: 3 Gy
Plan Total Fractions Prescribed: 10
Plan Total Prescribed Dose: 30 Gy
Reference Point Dosage Given to Date: 18 Gy
Reference Point Session Dosage Given: 3 Gy
Session Number: 6

## 2022-10-21 LAB — CMP (CANCER CENTER ONLY)
ALT: 11 U/L (ref 0–44)
AST: 12 U/L — ABNORMAL LOW (ref 15–41)
Albumin: 4.2 g/dL (ref 3.5–5.0)
Alkaline Phosphatase: 159 U/L — ABNORMAL HIGH (ref 38–126)
Anion gap: 7 (ref 5–15)
BUN: 12 mg/dL (ref 8–23)
CO2: 31 mmol/L (ref 22–32)
Calcium: 9.5 mg/dL (ref 8.9–10.3)
Chloride: 104 mmol/L (ref 98–111)
Creatinine: 0.61 mg/dL (ref 0.44–1.00)
GFR, Estimated: >60 mL/min (ref >60)
Glucose, Bld: 111 mg/dL — ABNORMAL HIGH (ref 70–99)
Potassium: 3.9 mmol/L (ref 3.5–5.1)
Sodium: 142 mmol/L (ref 135–145)
Total Bilirubin: 0.6 mg/dL (ref 0.3–1.2)
Total Protein: 6.6 g/dL (ref 6.5–8.1)

## 2022-10-21 LAB — CBC WITH DIFFERENTIAL (CANCER CENTER ONLY)
Abs Immature Granulocytes: 0.03 10*3/uL (ref 0.00–0.07)
Basophils Absolute: 0 10*3/uL (ref 0.0–0.1)
Basophils Relative: 1 %
Eosinophils Absolute: 0.1 10*3/uL (ref 0.0–0.5)
Eosinophils Relative: 1 %
HCT: 45.8 % (ref 36.0–46.0)
Hemoglobin: 15 g/dL (ref 12.0–15.0)
Immature Granulocytes: 1 %
Lymphocytes Relative: 10 %
Lymphs Abs: 0.7 10*3/uL (ref 0.7–4.0)
MCH: 29.8 pg (ref 26.0–34.0)
MCHC: 32.8 g/dL (ref 30.0–36.0)
MCV: 90.9 fL (ref 80.0–100.0)
Monocytes Absolute: 0.4 10*3/uL (ref 0.1–1.0)
Monocytes Relative: 6 %
Neutro Abs: 5.4 10*3/uL (ref 1.7–7.7)
Neutrophils Relative %: 81 %
Platelet Count: 190 10*3/uL (ref 150–400)
RBC: 5.04 MIL/uL (ref 3.87–5.11)
RDW: 14.4 % (ref 11.5–15.5)
WBC Count: 6.5 10*3/uL (ref 4.0–10.5)
nRBC: 0 % (ref 0.0–0.2)

## 2022-10-21 MED ORDER — LEVOTHYROXINE SODIUM 100 MCG PO TABS
100.0000 ug | ORAL_TABLET | Freq: Every day | ORAL | 2 refills | Status: DC
Start: 2022-10-21 — End: 2023-03-25

## 2022-10-21 MED ORDER — LIDOCAINE VISCOUS HCL 2 % MT SOLN
OROMUCOSAL | 2 refills | Status: DC
Start: 2022-10-21 — End: 2023-01-20

## 2022-10-21 MED ORDER — RADIAPLEXRX EX GEL: Freq: Once | CUTANEOUS | Status: AC

## 2022-10-21 MED ORDER — CYANOCOBALAMIN 1000 MCG/ML IJ SOLN
1000.0000 ug | Freq: Once | INTRAMUSCULAR | Status: AC
  Administered 2022-10-21: 1000 ug via INTRAMUSCULAR
  Filled 2022-10-21: qty 1

## 2022-10-21 MED ORDER — FOLIC ACID 1 MG PO TABS
1.0000 mg | ORAL_TABLET | Freq: Every day | ORAL | 2 refills | Status: DC
Start: 2022-10-21 — End: 2023-01-12

## 2022-10-21 MED ORDER — PROCHLORPERAZINE MALEATE 10 MG PO TABS
10.0000 mg | ORAL_TABLET | Freq: Four times a day (QID) | ORAL | 2 refills | Status: DC | PRN
Start: 2022-10-21 — End: 2024-01-01

## 2022-10-21 NOTE — Patient Instructions (Addendum)

## 2022-10-21 NOTE — Progress Notes (Signed)
Pharmacist Chemotherapy Monitoring - Initial Assessment    Anticipated start date: 10/28/22   The following has been reviewed per standard work regarding the patient's treatment regimen: The patient's diagnosis, treatment plan and drug doses, and organ/hematologic function Lab orders and baseline tests specific to treatment regimen  The treatment plan start date, drug sequencing, and pre-medications Prior authorization status  Patient's documented medication list, including drug-drug interaction screen and prescriptions for anti-emetics and supportive care specific to the treatment regimen The drug concentrations, fluid compatibility, administration routes, and timing of the medications to be used The patient's access for treatment and lifetime cumulative dose history, if applicable  The patient's medication allergies and previous infusion related reactions, if applicable   Changes made to treatment plan:  N/A  Follow up needed:  N/A   Demetrius Charity, RPH, 10/21/2022  2:59 PM

## 2022-10-21 NOTE — Progress Notes (Signed)
START ON PATHWAY REGIMEN - Non-Small Cell Lung     A cycle is every 21 days:     Pembrolizumab      Pemetrexed      Carboplatin   **Always confirm dose/schedule in your pharmacy ordering system**  Patient Characteristics: Stage IV Metastatic, Nonsquamous, Molecular Analysis Completed, Molecular Alteration Present and Targeted Therapy Exhausted OR KRAS G12C+ or HER2+ Present and No Prior Chemo/Immunotherapy OR No Alteration Present, Initial Chemotherapy/Immunotherapy, PS =  0, 1, No Alteration Present, No Alteration Present, Candidate for Immunotherapy, PD-L1 Expression Positive 1-49% (TPS) / Negative / Not Tested / Awaiting Test Results and Immunotherapy Candidate Therapeutic Status: Stage IV Metastatic Histology: Nonsquamous Cell Broad Molecular Profiling Status: Molecular Analysis Completed Molecular Analysis Results: No Alteration Present ECOG Performance Status: 1 Chemotherapy/Immunotherapy Line of Therapy: Initial Chemotherapy/Immunotherapy EGFR Exons 18-21 Mutation Testing Status: Completed and Negative ALK Fusion/Rearrangement Testing Status: Completed and Negative BRAF V600 Mutation Testing Status: Completed and Negative KRAS G12C Mutation Testing Status: Completed and Negative MET Exon 14 Mutation Testing Status: Completed and Negative RET Fusion/Rearrangement Testing Status: Completed and Negative HER2 Mutation Testing Status: Completed and Negative NTRK Fusion/Rearrangement Testing Status: Completed and Negative ROS1 Fusion/Rearrangement Testing Status: Completed and Negative Immunotherapy Candidate Status: Candidate for Immunotherapy PD-L1 Expression Status: PD-L1 Positive 1-49% (TPS) Intent of Therapy: Non-Curative / Palliative Intent, Discussed with Patient

## 2022-10-22 ENCOUNTER — Ambulatory Visit
Admission: RE | Admit: 2022-10-22 | Discharge: 2022-10-22 | Disposition: A | Discharge status: Home / Self Care | Payer: PPO | Source: Ambulatory Visit | Attending: Radiation Oncology | Admitting: Radiation Oncology

## 2022-10-22 ENCOUNTER — Other Ambulatory Visit: Payer: Self-pay

## 2022-10-22 DIAGNOSIS — C3431 Malignant neoplasm of lower lobe, right bronchus or lung: Secondary | ICD-10-CM | POA: Diagnosis not present

## 2022-10-22 DIAGNOSIS — G9529 Other cord compression: Secondary | ICD-10-CM | POA: Diagnosis not present

## 2022-10-22 DIAGNOSIS — C7951 Secondary malignant neoplasm of bone: Secondary | ICD-10-CM | POA: Diagnosis not present

## 2022-10-22 DIAGNOSIS — Z510 Encounter for antineoplastic radiation therapy: Secondary | ICD-10-CM | POA: Diagnosis not present

## 2022-10-22 LAB — RAD ONC ARIA SESSION SUMMARY
Course Elapsed Days: 8
Plan Fractions Treated to Date: 7
Plan Prescribed Dose Per Fraction: 3 Gy
Plan Total Fractions Prescribed: 10
Plan Total Prescribed Dose: 30 Gy
Reference Point Dosage Given to Date: 21 Gy
Reference Point Session Dosage Given: 3 Gy
Session Number: 7

## 2022-10-23 ENCOUNTER — Other Ambulatory Visit: Payer: Self-pay

## 2022-10-23 ENCOUNTER — Inpatient Hospital Stay: Payer: PPO

## 2022-10-23 ENCOUNTER — Ambulatory Visit
Admission: RE | Admit: 2022-10-23 | Discharge: 2022-10-23 | Disposition: A | Discharge status: Home / Self Care | Payer: PPO | Source: Ambulatory Visit | Attending: Radiation Oncology | Admitting: Radiation Oncology

## 2022-10-23 DIAGNOSIS — G9529 Other cord compression: Secondary | ICD-10-CM | POA: Diagnosis not present

## 2022-10-23 DIAGNOSIS — Z510 Encounter for antineoplastic radiation therapy: Secondary | ICD-10-CM | POA: Diagnosis not present

## 2022-10-23 DIAGNOSIS — C3431 Malignant neoplasm of lower lobe, right bronchus or lung: Secondary | ICD-10-CM | POA: Diagnosis not present

## 2022-10-23 DIAGNOSIS — C7951 Secondary malignant neoplasm of bone: Secondary | ICD-10-CM | POA: Diagnosis not present

## 2022-10-23 LAB — RAD ONC ARIA SESSION SUMMARY
Course Elapsed Days: 9
Plan Fractions Treated to Date: 8
Plan Prescribed Dose Per Fraction: 3 Gy
Plan Total Fractions Prescribed: 10
Plan Total Prescribed Dose: 30 Gy
Reference Point Dosage Given to Date: 24 Gy
Reference Point Session Dosage Given: 3 Gy
Session Number: 8

## 2022-10-24 ENCOUNTER — Other Ambulatory Visit: Payer: Self-pay

## 2022-10-24 ENCOUNTER — Ambulatory Visit
Admission: RE | Admit: 2022-10-24 | Discharge: 2022-10-24 | Disposition: A | Discharge status: Home / Self Care | Payer: PPO | Source: Ambulatory Visit | Attending: Radiation Oncology | Admitting: Radiation Oncology

## 2022-10-24 DIAGNOSIS — G9529 Other cord compression: Secondary | ICD-10-CM | POA: Diagnosis not present

## 2022-10-24 DIAGNOSIS — Z510 Encounter for antineoplastic radiation therapy: Secondary | ICD-10-CM | POA: Diagnosis not present

## 2022-10-24 DIAGNOSIS — C7951 Secondary malignant neoplasm of bone: Secondary | ICD-10-CM | POA: Diagnosis not present

## 2022-10-24 DIAGNOSIS — C3431 Malignant neoplasm of lower lobe, right bronchus or lung: Secondary | ICD-10-CM | POA: Diagnosis not present

## 2022-10-24 LAB — RAD ONC ARIA SESSION SUMMARY
Course Elapsed Days: 10
Plan Fractions Treated to Date: 9
Plan Prescribed Dose Per Fraction: 3 Gy
Plan Total Fractions Prescribed: 10
Plan Total Prescribed Dose: 30 Gy
Reference Point Dosage Given to Date: 27 Gy
Reference Point Session Dosage Given: 3 Gy
Session Number: 9

## 2022-10-25 ENCOUNTER — Other Ambulatory Visit: Payer: Self-pay

## 2022-10-25 ENCOUNTER — Ambulatory Visit
Admission: RE | Admit: 2022-10-25 | Discharge: 2022-10-25 | Disposition: A | Discharge status: Home / Self Care | Payer: PPO | Source: Ambulatory Visit | Attending: Radiation Oncology | Admitting: Radiation Oncology

## 2022-10-25 DIAGNOSIS — C7951 Secondary malignant neoplasm of bone: Secondary | ICD-10-CM | POA: Diagnosis not present

## 2022-10-25 DIAGNOSIS — G9529 Other cord compression: Secondary | ICD-10-CM | POA: Diagnosis not present

## 2022-10-25 DIAGNOSIS — Z510 Encounter for antineoplastic radiation therapy: Secondary | ICD-10-CM | POA: Diagnosis not present

## 2022-10-25 DIAGNOSIS — C3431 Malignant neoplasm of lower lobe, right bronchus or lung: Secondary | ICD-10-CM | POA: Diagnosis not present

## 2022-10-25 LAB — RAD ONC ARIA SESSION SUMMARY
Course Elapsed Days: 11
Plan Fractions Treated to Date: 10
Plan Prescribed Dose Per Fraction: 3 Gy
Plan Total Fractions Prescribed: 10
Plan Total Prescribed Dose: 30 Gy
Reference Point Dosage Given to Date: 30 Gy
Reference Point Session Dosage Given: 3 Gy
Session Number: 10

## 2022-10-25 MED FILL — Fosaprepitant Dimeglumine For IV Infusion 150 MG (Base Eq): INTRAVENOUS | Qty: 5 | Status: AC

## 2022-10-26 ENCOUNTER — Other Ambulatory Visit: Payer: Self-pay

## 2022-10-28 ENCOUNTER — Inpatient Hospital Stay: Payer: PPO

## 2022-10-28 ENCOUNTER — Other Ambulatory Visit: Payer: Self-pay | Admitting: Physician Assistant

## 2022-10-28 VITALS — BP 120/68 | HR 93 | Temp 98.7°F | Resp 18

## 2022-10-28 DIAGNOSIS — C7951 Secondary malignant neoplasm of bone: Secondary | ICD-10-CM | POA: Diagnosis not present

## 2022-10-28 DIAGNOSIS — Z79890 Hormone replacement therapy (postmenopausal): Secondary | ICD-10-CM | POA: Insufficient documentation

## 2022-10-28 DIAGNOSIS — K5900 Constipation, unspecified: Secondary | ICD-10-CM | POA: Insufficient documentation

## 2022-10-28 DIAGNOSIS — M549 Dorsalgia, unspecified: Secondary | ICD-10-CM | POA: Diagnosis not present

## 2022-10-28 DIAGNOSIS — C787 Secondary malignant neoplasm of liver and intrahepatic bile duct: Secondary | ICD-10-CM | POA: Diagnosis not present

## 2022-10-28 DIAGNOSIS — C3491 Malignant neoplasm of unspecified part of right bronchus or lung: Secondary | ICD-10-CM

## 2022-10-28 DIAGNOSIS — Z79620 Long term (current) use of immunosuppressive biologic: Secondary | ICD-10-CM | POA: Diagnosis not present

## 2022-10-28 DIAGNOSIS — Z5111 Encounter for antineoplastic chemotherapy: Secondary | ICD-10-CM | POA: Insufficient documentation

## 2022-10-28 DIAGNOSIS — C3431 Malignant neoplasm of lower lobe, right bronchus or lung: Secondary | ICD-10-CM | POA: Diagnosis not present

## 2022-10-28 DIAGNOSIS — Z5112 Encounter for antineoplastic immunotherapy: Secondary | ICD-10-CM | POA: Diagnosis not present

## 2022-10-28 DIAGNOSIS — Z923 Personal history of irradiation: Secondary | ICD-10-CM | POA: Diagnosis not present

## 2022-10-28 LAB — CBC WITH DIFFERENTIAL (CANCER CENTER ONLY)
Abs Immature Granulocytes: 0.03 10*3/uL (ref 0.00–0.07)
Basophils Absolute: 0 10*3/uL (ref 0.0–0.1)
Basophils Relative: 0 %
Eosinophils Absolute: 0 10*3/uL (ref 0.0–0.5)
Eosinophils Relative: 0 %
HCT: 45.6 % (ref 36.0–46.0)
Hemoglobin: 15.1 g/dL — ABNORMAL HIGH (ref 12.0–15.0)
Immature Granulocytes: 0 %
Lymphocytes Relative: 5 %
Lymphs Abs: 0.4 10*3/uL — ABNORMAL LOW (ref 0.7–4.0)
MCH: 29.8 pg (ref 26.0–34.0)
MCHC: 33.1 g/dL (ref 30.0–36.0)
MCV: 90.1 fL (ref 80.0–100.0)
Monocytes Absolute: 0.6 10*3/uL (ref 0.1–1.0)
Monocytes Relative: 8 %
Neutro Abs: 6.5 10*3/uL (ref 1.7–7.7)
Neutrophils Relative %: 87 %
Platelet Count: 142 10*3/uL — ABNORMAL LOW (ref 150–400)
RBC: 5.06 MIL/uL (ref 3.87–5.11)
RDW: 14.2 % (ref 11.5–15.5)
WBC Count: 7.6 10*3/uL (ref 4.0–10.5)
nRBC: 0 % (ref 0.0–0.2)

## 2022-10-28 LAB — CMP (CANCER CENTER ONLY)
ALT: 10 U/L (ref 0–44)
AST: 13 U/L — ABNORMAL LOW (ref 15–41)
Albumin: 4.4 g/dL (ref 3.5–5.0)
Alkaline Phosphatase: 135 U/L — ABNORMAL HIGH (ref 38–126)
Anion gap: 8 (ref 5–15)
BUN: 20 mg/dL (ref 8–23)
CO2: 28 mmol/L (ref 22–32)
Calcium: 9.7 mg/dL (ref 8.9–10.3)
Chloride: 105 mmol/L (ref 98–111)
Creatinine: 0.5 mg/dL (ref 0.44–1.00)
GFR, Estimated: >60 mL/min (ref >60)
Glucose, Bld: 105 mg/dL — ABNORMAL HIGH (ref 70–99)
Potassium: 3.9 mmol/L (ref 3.5–5.1)
Sodium: 141 mmol/L (ref 135–145)
Total Bilirubin: 0.5 mg/dL (ref 0.3–1.2)
Total Protein: 6.7 g/dL (ref 6.5–8.1)

## 2022-10-28 LAB — TSH: TSH: 3.964 u[IU]/mL (ref 0.350–4.500)

## 2022-10-28 MED ORDER — SODIUM CHLORIDE 0.9 % IV SOLN
200.0000 mg | Freq: Once | INTRAVENOUS | Status: AC
  Administered 2022-10-28: 200 mg via INTRAVENOUS
  Filled 2022-10-28: qty 200

## 2022-10-28 MED ORDER — SODIUM CHLORIDE 0.9 % IV SOLN
150.0000 mg | Freq: Once | INTRAVENOUS | Status: AC
  Administered 2022-10-28: 150 mg via INTRAVENOUS
  Filled 2022-10-28: qty 150

## 2022-10-28 MED ORDER — SODIUM CHLORIDE 0.9 % IV SOLN
430.5000 mg | Freq: Once | INTRAVENOUS | Status: AC
  Administered 2022-10-28: 430 mg via INTRAVENOUS
  Filled 2022-10-28: qty 43

## 2022-10-28 MED ORDER — SODIUM CHLORIDE 0.9 % IV SOLN: Freq: Once | INTRAVENOUS | Status: AC

## 2022-10-28 MED ORDER — SODIUM CHLORIDE 0.9 % IV SOLN
10.0000 mg | Freq: Once | INTRAVENOUS | Status: AC
  Administered 2022-10-28: 10 mg via INTRAVENOUS
  Filled 2022-10-28: qty 10

## 2022-10-28 MED ORDER — PALONOSETRON HCL INJECTION 0.25 MG/5ML
0.2500 mg | Freq: Once | INTRAVENOUS | Status: AC
  Administered 2022-10-28: 0.25 mg via INTRAVENOUS
  Filled 2022-10-28: qty 5

## 2022-10-28 MED ORDER — SODIUM CHLORIDE 0.9 % IV SOLN
500.0000 mg/m2 | Freq: Once | INTRAVENOUS | Status: AC
  Administered 2022-10-28: 900 mg via INTRAVENOUS
  Filled 2022-10-28: qty 20

## 2022-10-28 NOTE — Patient Instructions (Signed)
[facility]   Discharge Instructions: Thank you for choosing [facility] to provide your oncology and hematology care.   If you have a lab appointment with the Cancer Center, please go directly to the Cancer Center and check in at the registration area.   Wear comfortable clothing and clothing appropriate for easy access to any Portacath or PICC line.   We strive to give you quality time with your provider. You may need to reschedule your appointment if you arrive late (15 or more minutes).  Arriving late affects you and other patients whose appointments are after yours.  Also, if you miss three or more appointments without notifying the office, you may be dismissed from the clinic at the provider's discretion.      For prescription refill requests, have your pharmacy contact our office and allow 72 hours for refills to be completed.    Today you received the following chemotherapy and/or immunotherapy agents: Pembrolizumab (Keytruda), Pemetrexed (Alimta), and Carboplatin    To help prevent nausea and vomiting after your treatment, we encourage you to take your nausea medication as directed.  BELOW ARE SYMPTOMS THAT SHOULD BE REPORTED IMMEDIATELY: *FEVER GREATER THAN 100.4 F (38 C) OR HIGHER *CHILLS OR SWEATING *NAUSEA AND VOMITING THAT IS NOT CONTROLLED WITH YOUR NAUSEA MEDICATION *UNUSUAL SHORTNESS OF BREATH *UNUSUAL BRUISING OR BLEEDING *URINARY PROBLEMS (pain or burning when urinating, or frequent urination) *BOWEL PROBLEMS (unusual diarrhea, constipation, pain near the anus) TENDERNESS IN MOUTH AND THROAT WITH OR WITHOUT PRESENCE OF ULCERS (sore throat, sores in mouth, or a toothache) UNUSUAL RASH, SWELLING OR PAIN  UNUSUAL VAGINAL DISCHARGE OR ITCHING   Items with * indicate a potential emergency and should be followed up as soon as possible or go to the Emergency Department if any problems should occur.  Please show the  CHEMOTHERAPY ALERT CARD or IMMUNOTHERAPY ALERT CARD at check-in to the Emergency Department and triage nurse.  Should you have questions after your visit or need to cancel or reschedule your appointment, please contact [facility]  Dept: [phone]  and follow the prompts.  Office hours are 8:00 a.m. to 4:30 p.m. Monday - Friday. Please note that voicemails left after 4:00 p.m. may not be returned until the following business day.  We are closed weekends and major holidays. You have access to a nurse at all times for urgent questions. Please call the main number to the clinic Dept: [phone] and follow the prompts.   For any non-urgent questions, you may also contact your provider using MyChart. We now offer e-Visits for anyone 18 and older to request care online for non-urgent symptoms. For details visit [email or web address].   Also download the MyChart app! Go to the app store, search "MyChart", open the app, select [facility], and log in with your MyChart username and password.   

## 2022-10-29 ENCOUNTER — Telehealth: Payer: Self-pay | Admitting: *Deleted

## 2022-10-29 LAB — T4: T4, Total: 12 ug/dL (ref 4.5–12.0)

## 2022-10-29 NOTE — Telephone Encounter (Signed)
Called & left message to call back to let us know how she did with her treatment yesterday.

## 2022-10-29 NOTE — Telephone Encounter (Signed)
-----   Message from Caesar Chestnut, RN sent at 10/28/2022  4:33 PM EDT ----- Regarding: First Time Keytruda, Alimta, Carboplatin - Dr. Arbutus Ped Patient First Time Keytruda, Alimta, Carboplatin - Dr. Arbutus Ped Patient  Patient tolerated treatment well.

## 2022-10-30 ENCOUNTER — Telehealth: Payer: Self-pay | Admitting: Medical Oncology

## 2022-10-30 NOTE — Radiation Completion Notes (Signed)
Patient Name: Hayley Wu, FROMMER MRN: [id number] Date of Birth: 25-May-1951 Referring Physician: Marikay Alar, M.D. Date of Service: 2022-10-30 Radiation Oncologist: Lonie Peak, M.D. [facility] [place]                             RADIATION ONCOLOGY END OF TREATMENT NOTE     Diagnosis: C79.51 Secondary malignant neoplasm of bone Staging on 2022-10-01: Adenocarcinoma of right lung, stage 4 (HCC) T=cT1b, N=cN2, M=cM1c Intent: Palliative     ==========DELIVERED PLANS==========  First Treatment Date: 2022-10-14 - Last Treatment Date: 2022-10-25   Plan Name: Spine_T Site: Thoracic Spine Technique: 3D Mode: Photon Dose Per Fraction: 3 Gy Prescribed Dose (Delivered / Prescribed): 30 Gy / 30 Gy Prescribed Fxs (Delivered / Prescribed): 10 / 10     ==========ON TREATMENT VISIT DATES========== 2022-10-14, 2022-10-21     ==========UPCOMING VISITS==========       ==========APPENDIX - ON TREATMENT VISIT NOTES==========   See weekly On Treatment Notes is Epic for details.

## 2022-10-30 NOTE — Telephone Encounter (Signed)
Cytotoxic amounts affect on Septic system. Pt is staying w/ family who has a septic tank. Hayley Wu read that flushing cytotoxic medications  will kill the friendly bacteria and lead to sewage backup and system failure. It is expensive to repair it ,pump it out.   Hayley Wu said she cannot have someone stay at pts house . I told her to contact the company that flushes their system. She said they have not called her back.

## 2022-10-30 NOTE — Progress Notes (Signed)
[facility] OFFICE PROGRESS NOTE  [name] [address] [place] [address]  DIAGNOSIS: stage IV (T1b, N2, M1 C) non-small cell lung cancer, adenocarcinoma presented with right lower lobe lung nodule in addition to small bilateral pulmonary nodules and right hilar and mediastinal lymphadenopathy in addition to liver and bone metastasis diagnosed in April 2024. 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   PDL1: 20%   Guardant 360: no actionable mutations.   PRIOR THERAPY:  1) Decompressive thoracic laminectomy of the T7 with spinal cord decompression and biopsy of the epidural mass in April 2024.  2) Palliative radiation to the bone under the care of Dr. Basilio Cairo. Last day scheduled for 10/25/22   CURRENT THERAPY:  Carboplatin for an AUC of 5, Alimta 500 mg/m, Keytruda 200 mg IV every 3 weeks.  First dose expected on 10/28/2022. Status post 1 cycle.  INTERVAL HISTORY: MATTILYNN WHITCOMB 72 y.o. female returns to the clinic today for a follow-up visit accompanied by her son-in-law.  The patient was recently diagnosed with lung cancer after presenting to the hospital last month with a chief complaint of back pain for which she had decompressive thoracic laminectomy.  She recently completed palliative radiation to the metastatic bone lesions.  She is currently undergoing palliative systemic chemotherapy and immunotherapy.  She is status post her first cycle of treatment last week which she tolerated well except she is having constipation. She called the triage line over the weekend.  She has tried Dulcolax and MiraLAX.  She thinks her last bowel movement may have been 3 to 4 days ago.  She was wondering if she can use a suppository or enema.  Of note, she is prescribed pain medication for her back pain which she takes on average 1 time per day. She states this controls her  pain and she does not feel she needs to see palliative care. Otherwise, she generally will take Tylenol.  Her back is hurting today and she did not take any of her oxycodone today.  With the constipation, she denies any significant abdominal pain.  She only had 1 mild episode of nausea this morning without any vomiting, although she felt like she was going to. Today she denies any fever, chills, or night sweats. Per chart review, it looks like she has lost weight; however, she tells me today that this is the first time she was weighted and the other weights in the chart were her estimates based on her weight at home. She does not think she lost weight.    Denies any chest pain, shortness of breath, cough, or hemoptysis.  She continues to have weakness in her legs for which she is currently undergoing physical therapy twice a week. They are wondering who to reach out to renew this. They also mention that she is starting to get signs of early bedsore. Denies any headache or visual changes.  She is here today for evaluation and a 1 week follow-up visit to manage any adverse side effects of treatment.   MEDICAL HISTORY: Past Medical History:  Diagnosis Date   Allergy    Contact lens/glasses fitting    Hypertension     ALLERGIES:  is allergic to erythromycin, amoxicillin, latex, and prednisone.  MEDICATIONS:  Current Outpatient Medications  Medication Sig Dispense Refill   acetaminophen (TYLENOL) 500 MG tablet Take 500 mg by  mouth every 6 (six) hours as needed for moderate pain.     amLODipine (NORVASC) 10 MG tablet Take 10 mg by mouth daily.     folic acid (FOLVITE) 1 MG tablet Take 1 tablet (1 mg total) by mouth daily. 30 tablet 2   levothyroxine (SYNTHROID) 100 MCG tablet Take 1 tablet (100 mcg total) by mouth daily at 6 (six) AM. 30 tablet 2   lidocaine (XYLOCAINE) 2 % solution Patient: Mix 1part 2% viscous lidocaine, 1part H20. Swallow 10mL of diluted mixture, before meals and at bedtime,  up to QID 200 mL 2   losartan (COZAAR) 100 MG tablet Take 100 mg by mouth daily.     oxyCODONE (OXY IR/ROXICODONE) 5 MG immediate release tablet Take 1 tablet (5 mg total) by mouth every 3 (three) hours as needed for moderate pain ((score 4 to 6)). 30 tablet 0   prochlorperazine (COMPAZINE) 10 MG tablet Take 1 tablet (10 mg total) by mouth every 6 (six) hours as needed. 30 tablet 2   senna (SENOKOT) 8.6 MG TABS tablet Take 1 tablet (8.6 mg total) by mouth 2 (two) times daily. 120 tablet 0   VITAMIN D PO Take 1 tablet by mouth daily. (Patient not taking: Reported on 11/05/2022)     No current facility-administered medications for this visit.    SURGICAL HISTORY:  Past Surgical History:  Procedure Laterality Date   ABDOMINAL HYSTERECTOMY     BREAST SURGERY  2010   rt lumpectomy-neg   LAMINECTOMY N/A 09/21/2022   Procedure: THORACIC LAMINECTOMY FOR TUMOR;  Surgeon: Tia Alert, MD;  Location: [facility];  Service: Neurosurgery;  Laterality: N/A;   OPEN REDUCTION INTERNAL FIXATION (ORIF) DISTAL RADIAL FRACTURE Left 11/26/2013   Procedure: OPEN REDUCTION INTERNAL FIXATION (ORIF) LEFT  DISTAL RADIUS ;  Surgeon: Nestor Lewandowsky, MD;  Location: [facility];  Service: Orthopedics;  Laterality: Left;  with block   TUBAL LIGATION      REVIEW OF SYSTEMS:   Review of Systems  Constitutional: Positive for fatigue and questionable weight loss. Negative for appetite change, chills, and fever.  HENT: Negative for mouth sores, nosebleeds, sore throat and trouble swallowing.   Eyes: Negative for eye problems and icterus.  Respiratory: Negative for cough, hemoptysis, shortness of breath and wheezing.   Cardiovascular: Negative for chest pain and leg swelling.  Gastrointestinal: Positive for constipation and mild nausea. Negative for abdominal pain, diarrhea, and vomiting.  Genitourinary: Negative for bladder incontinence, difficulty urinating, dysuria, frequency and hematuria.   Musculoskeletal:  Positive for back pain.  Negative for back pain, gait problem, neck pain and neck stiffness.  Skin: Negative for itching and rash.  Neurological: Positive for weakness in the legs bilaterally.  Negative for dizziness, gait problem, headaches, light-headedness and seizures.  Hematological: Negative for adenopathy. Does not bruise/bleed easily.  Psychiatric/Behavioral: Negative for confusion, depression and sleep disturbance. The patient is not nervous/anxious.      PHYSICAL EXAMINATION:  Blood pressure 97/62, pulse 95, temperature 97.7 F (36.5 C), temperature source Temporal, resp. rate 17, weight 150 lb 4.8 oz (68.2 kg), SpO2 94 %.  ECOG PERFORMANCE STATUS: 1  Physical Exam  Constitutional: Oriented to person, place, and time and well-developed, well-nourished, and in no distress. She seemed uncomfortable due to her back pain and took a oxycodone while in the clinic.  HENT:  Head: Normocephalic and atraumatic.  Mouth/Throat: Oropharynx is clear and moist. No oropharyngeal exudate.  Eyes: Conjunctivae are normal. Right eye exhibits no  discharge. Left eye exhibits no discharge. No scleral icterus.  Neck: Normal range of motion. Neck supple.  Cardiovascular: Normal rate, regular rhythm, normal heart sounds and intact distal pulses.   Pulmonary/Chest: Effort normal and breath sounds normal. No respiratory distress. No wheezes. No rales.  Abdominal: Soft. Bowel sounds are normal. Exhibits no distension and no mass. There is no tenderness.  Musculoskeletal: Normal range of motion. Exhibits no edema.  Lymphadenopathy:    No cervical adenopathy.  Neurological: Alert and oriented to person, place, and time.  Exhibits muscle wasting.  Is examined in the wheelchair.  Skin: Skin is warm and dry. No rash noted. Not diaphoretic. No erythema. No pallor.  Psychiatric: Mood, memory and judgment normal.  Vitals reviewed.  LABORATORY DATA: Lab Results  Component Value Date   WBC 2.4 (L) 11/05/2022    HGB 13.8 11/05/2022   HCT 41.8 11/05/2022   MCV 90.1 11/05/2022   PLT 90 (L) 11/05/2022      Chemistry      Component Value Date/Time   NA 140 11/05/2022 1121   K 3.8 11/05/2022 1121   CL 103 11/05/2022 1121   CO2 28 11/05/2022 1121   BUN 27 (H) 11/05/2022 1121   CREATININE 0.53 11/05/2022 1121      Component Value Date/Time   CALCIUM 9.3 11/05/2022 1121   ALKPHOS 111 11/05/2022 1121   AST 16 11/05/2022 1121   ALT 25 11/05/2022 1121   BILITOT 0.7 11/05/2022 1121       RADIOGRAPHIC STUDIES:  NM PET Image Initial (PI) Skull Base To Thigh (F-18 FDG)  Result Date: 10/21/2022 CLINICAL DATA:  Initial treatment strategy for non-small cell lung cancer. EXAM: NUCLEAR MEDICINE PET SKULL BASE TO THIGH TECHNIQUE: 8.0 mCi F-18 FDG was injected intravenously. Full-ring PET imaging was performed from the skull base to thigh after the radiotracer. CT data was obtained and used for attenuation correction and anatomic localization. Fasting blood glucose: 114 mg/dl COMPARISON:  CT scan 40/98/1191 FINDINGS: Mediastinal blood pool activity: SUV max 1.96 Liver activity: SUV max NA NECK: No hypermetabolic lymph nodes in the neck. Incidental CT findings: None. CHEST: The dominant right lower lobe pulmonary nodule is hypermetabolic with SUV max of 2.87 and consistent pulmonary neoplasm. Hypermetabolic right hilar and mediastinal nodes consistent with metastatic disease. Right hilar node has an SUV max of 3.1. Right-sided subcarinal node has an SUV max of 5.53. Pretracheal lymph node has an SUV max of 3.23 Other small pulmonary nodules are mildly hypermetabolic and consistent with pulmonary metastatic disease. 5 mm right upper lobe nodule has an SUV max of 1.64. No hypermetabolic breast masses, supraclavicular or axillary adenopathy. Incidental CT findings: Advanced emphysematous changes and pulmonary scarring. Small pleural effusions. Aortic and coronary artery calcifications are noted. ABDOMEN/PELVIS:  Scattered low-attenuation liver lesions are again demonstrated. Some of these are clearly benign cysts but others are mildly hypermetabolic and consistent with hepatic metastatic disease. Segment 5 lesion has an SUV max of 3.62. Segment 6 lesion has an SUV max of 3.82. Other smaller lesions are indeterminate. No findings for adrenal gland metastasis. No abdominopelvic lymphadenopathy. Incidental CT findings: Advanced vascular disease but no aneurysm. Cholelithiasis. SKELETON: Diffuse hypermetabolic metastatic bone disease. T4 vertebral body lesion has an SUV max of 6.53. There is also a lesion in the left pedicle and lamina area with SUV max of 6.15. T6 lesion has an SUV max of 7.80 and is also involvement the posterior elements bilaterally and evidence of prior postsurgical changes. T7 lesion has an  SUV max of 7.68. Left acetabular lesion has an SUV max of 4.01. Left hip lesion has an SUV max of 4.13. Right scapular tip lesion has an SUV max of 3.22 Incidental CT findings: None. IMPRESSION: 1. Hypermetabolic right lower lobe pulmonary nodule consistent with pulmonary neoplasm. 2. Hypermetabolic right hilar and mediastinal lymphadenopathy. 3. Pulmonary metastatic disease. 4. Hepatic metastatic disease. 5. Diffuse hypermetabolic osseous metastatic disease. Aortic Atherosclerosis (ICD10-I70.0). Electronically Signed   By: Rudie Meyer M.D.   On: 10/21/2022 10:18     ASSESSMENT/PLAN:  This is a very pleasant 72 year old Caucasian female recently diagnosed with stage IV (T1b, N2, M1 C) non-small cell lung cancer, adenocarcinoma presented with right lower lobe lung nodule in addition to small bilateral pulmonary nodules and right hilar and mediastinal lymphadenopathy in addition to liver and bone metastasis diagnosed in April 2024. 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 Her PDL1 expression is 20%. She has no  actionable mutations.   She is completed palliative radiation to the painful metastatic bone lesions under the care of Dr. Basilio Cairo in the last day radiation was on 10/25/2022.   She is currently undergoing systemic chemotherapy with carboplatin for an AUC of 5, Alimta 500 mg/m, and Keytruda 200 mg IV every 3 weeks. She is status post 1 cycle and tolerated it well.   Her main complaint is constipation today.  Discussed that in general we would recommend avoiding enemas or suppositories due to concerns with introduction of infection.  She was given a handout of options to try over-the-counter such as milk of magnesia, Dulcolax, MiraLAX, fruits and vegetables, prune juice, drinking plenty of fluids, and being as active as possible.  Once she achieves a bowel movement, recommend being on bowel prophylaxis with stool softeners daily such as senna or Colace.  If she has not achieved a bowel movement into her days more than her normal bowel habits and in the future would recommend taking a laxative at that time.  I offered arranging for a KUB today to rule out obstruction.  The patient denies any abdominal pain.  She is passing gas.  She had some mild nausea this morning without any vomiting.  She is eager to return home due to her back pain.  Therefore, she will not have a KUB today but she knows this is ordered if she develops any worsening symptoms this week such as abdominal pain, continued constipation, nausea, vomiting, etc.  Her back pain, she took an oxycodone while in the clinic today.  She states this typically controls her pain.  We discussed referral to palliative care for pain management versus sending in MS Contin 15 mg twice daily.  However, discussed this may worsen constipation.  Because the patient's pain is controlled with oxycodone, she will discontinue taking oxycodone for breakthrough pain at this time which she typically only needs to take once a day.  She also takes Tylenol.  Regarding  the beginning of the bedsore, discussed the importance of alternating positions every 2 hours and using pillow/wedges to redistribute weight.  If she develops any skin breakdown, advised to please let us know.   Will reach out to their PCP and neurologist regarding extending the physical therapy.  Labs were reviewed which are acceptable.  We will continue to monitor these on a weekly basis.  Her white blood cell count and ANC is slightly low.  We will continue  to monitor this and would consider G-CSF injection if her ANC or around 0.6 or less.  Her ANC is 1.5 today.  Of course, the patient understands if she develops any signs or symptoms of infection in the interval that we recommend evaluation regardless.  We will see her back for follow-up visit in 2 weeks for evaluation and repeat blood work before undergoing cycle #2.  The patient was advised to call immediately if he has any concerning symptoms in the interval. The patient voices understanding of current disease status and treatment options and is in agreement with the current care plan. All questions were answered. The patient knows to call the clinic with any problems, questions or concerns. We can certainly see the patient much sooner if necessary       Orders Placed This Encounter  Procedures   DG Abd 1 View    Standing Status:   Future    Standing Expiration Date:   11/05/2023    Order Specific Question:   Reason for Exam (SYMPTOM  OR DIAGNOSIS REQUIRED)    Answer:   chemo patient, constipation. Assess to rule out obstructive gas pattern    Order Specific Question:   Preferred imaging location?    Answer:   [facility]    The total time spent in the appointment was 30-39 minutes   Shakenna Herrero L Nathalie Cavendish, PA-C 11/05/22

## 2022-11-01 ENCOUNTER — Other Ambulatory Visit: Payer: Self-pay | Admitting: Medical Oncology

## 2022-11-01 DIAGNOSIS — C3491 Malignant neoplasm of unspecified part of right bronchus or lung: Secondary | ICD-10-CM

## 2022-11-01 NOTE — Progress Notes (Signed)
Lab orders entered

## 2022-11-05 ENCOUNTER — Inpatient Hospital Stay (HOSPITAL_BASED_OUTPATIENT_CLINIC_OR_DEPARTMENT_OTHER): Payer: PPO | Admitting: Physician Assistant

## 2022-11-05 ENCOUNTER — Inpatient Hospital Stay: Payer: PPO

## 2022-11-05 ENCOUNTER — Other Ambulatory Visit: Payer: Self-pay

## 2022-11-05 VITALS — BP 97/62 | HR 95 | Temp 97.7°F | Resp 17 | Wt 150.3 lb

## 2022-11-05 DIAGNOSIS — C7951 Secondary malignant neoplasm of bone: Secondary | ICD-10-CM

## 2022-11-05 DIAGNOSIS — C3491 Malignant neoplasm of unspecified part of right bronchus or lung: Secondary | ICD-10-CM

## 2022-11-05 DIAGNOSIS — Z5112 Encounter for antineoplastic immunotherapy: Secondary | ICD-10-CM | POA: Diagnosis not present

## 2022-11-05 DIAGNOSIS — K5900 Constipation, unspecified: Secondary | ICD-10-CM

## 2022-11-05 LAB — CMP (CANCER CENTER ONLY)
ALT: 25 U/L (ref 0–44)
AST: 16 U/L (ref 15–41)
Albumin: 4.1 g/dL (ref 3.5–5.0)
Alkaline Phosphatase: 111 U/L (ref 38–126)
Anion gap: 9 (ref 5–15)
BUN: 27 mg/dL — ABNORMAL HIGH (ref 8–23)
CO2: 28 mmol/L (ref 22–32)
Calcium: 9.3 mg/dL (ref 8.9–10.3)
Chloride: 103 mmol/L (ref 98–111)
Creatinine: 0.53 mg/dL (ref 0.44–1.00)
GFR, Estimated: >60 mL/min (ref >60)
Glucose, Bld: 106 mg/dL — ABNORMAL HIGH (ref 70–99)
Potassium: 3.8 mmol/L (ref 3.5–5.1)
Sodium: 140 mmol/L (ref 135–145)
Total Bilirubin: 0.7 mg/dL (ref 0.3–1.2)
Total Protein: 6.3 g/dL — ABNORMAL LOW (ref 6.5–8.1)

## 2022-11-05 LAB — CBC WITH DIFFERENTIAL (CANCER CENTER ONLY)
Abs Immature Granulocytes: 0.01 10*3/uL (ref 0.00–0.07)
Basophils Absolute: 0 10*3/uL (ref 0.0–0.1)
Basophils Relative: 1 %
Eosinophils Absolute: 0.1 10*3/uL (ref 0.0–0.5)
Eosinophils Relative: 3 %
HCT: 41.8 % (ref 36.0–46.0)
Hemoglobin: 13.8 g/dL (ref 12.0–15.0)
Immature Granulocytes: 0 %
Lymphocytes Relative: 13 %
Lymphs Abs: 0.3 10*3/uL — ABNORMAL LOW (ref 0.7–4.0)
MCH: 29.7 pg (ref 26.0–34.0)
MCHC: 33 g/dL (ref 30.0–36.0)
MCV: 90.1 fL (ref 80.0–100.0)
Monocytes Absolute: 0.4 10*3/uL (ref 0.1–1.0)
Monocytes Relative: 19 %
Neutro Abs: 1.5 10*3/uL — ABNORMAL LOW (ref 1.7–7.7)
Neutrophils Relative %: 64 %
Platelet Count: 90 10*3/uL — ABNORMAL LOW (ref 150–400)
RBC: 4.64 MIL/uL (ref 3.87–5.11)
RDW: 13.2 % (ref 11.5–15.5)
WBC Count: 2.4 10*3/uL — ABNORMAL LOW (ref 4.0–10.5)
nRBC: 0 % (ref 0.0–0.2)

## 2022-11-05 NOTE — Patient Instructions (Signed)
It is common for patients who are undergoing treatment and taking certain prescribed medications to experience side-effects with constipation.  If you experience constipation, please take stool softener such as Colace or Senna one tablet twice a day everyday to avoid constipation.  These medications are available over the counter.  Of course, if you have diarrhea, stop taking stool softeners.  Drinking plenty of fluid, eating fruits and vegetable, and being active also reduces the risk of constipation.   If despite taking stool softeners, and you still have no bowel movement for 2 days or more than your normal bowel habit frequency, please take one of the following over the counter laxatives:  MiraLax, Milk of Magnesia or Mag Citrate everyday. The goal is to have at least one bowel movement every other day.   

## 2022-11-09 ENCOUNTER — Other Ambulatory Visit: Payer: Self-pay

## 2022-11-11 ENCOUNTER — Encounter: Payer: Self-pay | Admitting: Internal Medicine

## 2022-11-11 NOTE — Progress Notes (Signed)
Patient's daughter Belenda Cruise also had concern about assistance in home with patient. Advised I would send message to social worker to follow up. Staff message sent to [employer].  I also provided number to Joi with Adult Medicaid at [phone] if interested in applying. She was appreciative and verbalized understanding.  She has my card for any additional financial questions or concerns.

## 2022-11-11 NOTE — Progress Notes (Signed)
Patient's daughter Belenda Cruise called regarding financial assistance and assistance with help at home.  Introduced myself as Dance movement psychotherapist and provided available resources. Discussed one-time $1000 Marketing executive to assist with personal expenses while going through treatment. Advised what is needed to apply and she can submit at next visit on 11/18/22 to provide to registration staff and they will scan and email to me and have patient sign grant paperwork. They will then call me at earliest convenience to discuss grant expenses in detail.  They have my card for any additional financial questions or concerns.

## 2022-11-14 NOTE — Progress Notes (Signed)
[name] OFFICE PROGRESS NOTE  Hayley Wu [address] [place] [address]  DIAGNOSIS: Stage IV (T1b, N2, M1 C) non-small cell lung cancer, adenocarcinoma presented with right lower lobe lung nodule in addition to small bilateral pulmonary nodules and right hilar and mediastinal lymphadenopathy in addition to liver and bone metastasis diagnosed in April 2024. 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   PDL1: 20%   Guardant 360: no actionable mutations.   PRIOR THERAPY: 1) Decompressive thoracic laminectomy of the T7 with spinal cord decompression and biopsy of the epidural mass in April 2024.  2) Palliative radiation to the bone under the care of Dr. Basilio Wu. Last day scheduled for 10/25/22   CURRENT THERAPY: Carboplatin for an AUC of 5, Alimta 500 mg/m, Keytruda 200 mg IV every 3 weeks.  First dose expected on 10/28/2022. Status post 1 cycle.   INTERVAL HISTORY: Hayley Wu 72 y.o. female returns to the clinic today for a follow-up visit accompanied by her daughter. The patient was recently diagnosed with lung cancer after presenting to the hospital last month with a chief complaint of back pain for which she had decompressive thoracic laminectomy.  She recently completed palliative radiation to the metastatic bone lesions. She has a follow up visit with radiation oncology on 11/28/22.   She is currently undergoing palliative systemic chemotherapy and immunotherapy.  She is status post her first cycle of treatment 3 weeks ago which she tolerated well except she had constipation which has improved at this time.    Of note, she is prescribed pain medication for her back pain if needed. She does not take this unless absolutely needed. She mostly takes tylenol. She did have to take a percocet last night. Today she denies any fever, chills, or night sweats.  She  is unable to stand on the scale today for a weight.    Denies any chest pain, shortness of breath, cough, or hemoptysis.  She continues to have weakness in her legs for which she is currently undergoing physical therapy twice a week. Denies any headache or visual changes.  Denies any constipation, nausea, vomiting, or diarrhea. She is here today for evaluation and repeat blood work before undergoing cycle #2.   MEDICAL HISTORY: Past Medical History:  Diagnosis Date   Allergy    Contact lens/glasses fitting    Hypertension     ALLERGIES:  is allergic to erythromycin, amoxicillin, latex, and prednisone.  MEDICATIONS:  Current Outpatient Medications  Medication Sig Dispense Refill   acetaminophen (TYLENOL) 500 MG tablet Take 500 mg by mouth every 6 (six) hours as needed for moderate pain.     amLODipine (NORVASC) 10 MG tablet Take 10 mg by mouth daily.     folic acid (FOLVITE) 1 MG tablet Take 1 tablet (1 mg total) by mouth daily. 30 tablet 2   levothyroxine (SYNTHROID) 100 MCG tablet Take 1 tablet (100 mcg total) by mouth daily at 6 (six) AM. 30 tablet 2   lidocaine (XYLOCAINE) 2 % solution Patient: Mix 1part 2% viscous lidocaine, 1part H20. Swallow 10mL of diluted mixture, before meals and at bedtime, up to QID 200 mL 2   losartan (COZAAR) 100 MG tablet Take 100 mg by mouth daily.     oxyCODONE (OXY IR/ROXICODONE) 5 MG immediate release tablet Take 1 tablet (5 mg total) by mouth every 3 (  three) hours as needed for moderate pain ((score 4 to 6)). 30 tablet 0   prochlorperazine (COMPAZINE) 10 MG tablet Take 1 tablet (10 mg total) by mouth every 6 (six) hours as needed. 30 tablet 2   senna (SENOKOT) 8.6 MG TABS tablet Take 1 tablet (8.6 mg total) by mouth 2 (two) times daily. 120 tablet 0   VITAMIN D PO Take 1 tablet by mouth daily. (Patient not taking: Reported on 11/05/2022)     No current facility-administered medications for this visit.    SURGICAL HISTORY:  Past Surgical  History:  Procedure Laterality Date   ABDOMINAL HYSTERECTOMY     BREAST SURGERY  2010   rt lumpectomy-neg   LAMINECTOMY N/A 09/21/2022   Procedure: THORACIC LAMINECTOMY FOR TUMOR;  Surgeon: Hayley Alert, MD;  Location: [facility] OR;  Service: Neurosurgery;  Laterality: N/A;   OPEN REDUCTION INTERNAL FIXATION (ORIF) DISTAL RADIAL FRACTURE Left 11/26/2013   Procedure: OPEN REDUCTION INTERNAL FIXATION (ORIF) LEFT  DISTAL RADIUS ;  Surgeon: Hayley Lewandowsky, MD;  Location: [facility];  Service: Orthopedics;  Laterality: Left;  with block   TUBAL LIGATION      REVIEW OF SYSTEMS:   Review of Systems  Constitutional: Negative for appetite change, chills, fatigue, fever and unexpected weight change.  HENT: Negative for mouth sores, nosebleeds, sore throat and trouble swallowing.   Eyes: Negative for eye problems and icterus.  Respiratory: Negative for cough, hemoptysis, shortness of breath and wheezing.   Cardiovascular: Negative for chest pain and leg swelling.  Gastrointestinal: Negative for abdominal pain, constipation, diarrhea, nausea and vomiting.  Genitourinary: Negative for bladder incontinence, difficulty urinating, dysuria, frequency and hematuria.   Musculoskeletal: Positive for back pain. Negative for gait problem, neck pain and neck stiffness.  Skin: Negative for itching and rash.  Neurological: Positive for weakness in the legs bilaterally. Negative for dizziness, gait problem, headaches, light-headedness and seizures.  Hematological: Negative for adenopathy. Does not bruise/bleed easily.  Psychiatric/Behavioral: Negative for confusion, depression and sleep disturbance. The patient is not nervous/anxious.     PHYSICAL EXAMINATION:  Blood pressure 106/65, pulse (!) 106, temperature 98.3 F (36.8 C), temperature source Oral, resp. rate 16, height 5\' 3"  (1.6 m), SpO2 96 %.  ECOG PERFORMANCE STATUS: 3  Physical Exam  Constitutional: Oriented to person, place, and time and  well-developed, well-nourished, and in no distress. She seemed uncomfortable due to her back pain and took a oxycodone while in the clinic.  HENT:  Head: Normocephalic and atraumatic.  Mouth/Throat: Oropharynx is clear and moist. No oropharyngeal exudate.  Eyes: Conjunctivae are normal. Right eye exhibits no discharge. Left eye exhibits no discharge. No scleral icterus.  Neck: Normal range of motion. Neck supple.  Cardiovascular: Normal rate, regular rhythm, normal heart sounds and intact distal pulses.   Pulmonary/Chest: Effort normal and breath sounds normal. No respiratory distress. No wheezes. No rales.  Abdominal: Soft. Bowel sounds are normal. Exhibits no distension and no mass. There is no tenderness.  Musculoskeletal: Normal range of motion. Exhibits no edema.  Lymphadenopathy:    No cervical adenopathy.  Neurological: Wu and oriented to person, place, and time.  Exhibits muscle wasting. She was examined in the wheelchair.  Skin: Skin is warm and dry. No rash noted. Not diaphoretic. No erythema. No pallor.  Psychiatric: Mood, memory and judgment normal.  Vitals reviewed.  LABORATORY DATA: Lab Results  Component Value Date   WBC 8.9 11/18/2022   HGB 13.9 11/18/2022   HCT 40.1 11/18/2022  MCV 88.7 11/18/2022   PLT 270 11/18/2022      Chemistry      Component Value Date/Time   NA 139 11/18/2022 1022   K 3.4 (L) 11/18/2022 1022   CL 103 11/18/2022 1022   CO2 28 11/18/2022 1022   BUN 8 11/18/2022 1022   CREATININE 0.48 11/18/2022 1022      Component Value Date/Time   CALCIUM 9.2 11/18/2022 1022   ALKPHOS 106 11/18/2022 1022   AST 23 11/18/2022 1022   ALT 27 11/18/2022 1022   BILITOT 0.4 11/18/2022 1022       RADIOGRAPHIC STUDIES:  No results found.   ASSESSMENT/PLAN:  This is a very pleasant 72 year old Caucasian female recently diagnosed with stage IV (T1b, N2, M1 C) non-small cell lung cancer, adenocarcinoma presented with right lower lobe lung nodule in  addition to small bilateral pulmonary nodules and right hilar and mediastinal lymphadenopathy in addition to liver and bone metastasis diagnosed in April 2024. 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 Her PDL1 expression is 20%. She has no actionable mutations.    She is completed palliative radiation to the painful metastatic bone lesions under the care of Dr. Basilio Wu in the last day radiation was on 10/25/2022.    She is currently undergoing systemic chemotherapy with carboplatin for an AUC of 5, Alimta 500 mg/m, and Keytruda 200 mg IV every 3 weeks. She is status post 1 cycle and tolerated it well.   Labs were reviewed. Recommend that she proceed with cycle #2 today as scheduled.    I have filled out a disability placard for the patient.  She has limitations with walking due to her back pain, weakness in the legs, and breathing due to her malignancy.   We will see her back for follow-up visit in 3 weeks for evaluation and repeat blood work before undergoing cycle #3.  Advised to use stool softener and take a laxative if she has not had a bowel movement in 1 or 2 days more than her normal bowel habits.  She has oxycodone if needed for pain. She mostly uses tylenol.   The patient was advised to call immediately if she has any concerning symptoms in the interval. The patient voices understanding of current disease status and treatment options and is in agreement with the current care plan. All questions were answered. The patient knows to call the clinic with any problems, questions or concerns. We can certainly see the patient much sooner if necessary     No orders of the defined types were placed in this encounter.    The total time spent in the appointment was 20-29 minutes.   Nikkole Placzek L Ronen Bromwell, PA-C 11/18/22

## 2022-11-14 NOTE — Progress Notes (Signed)
Mr. 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 She completed treatment on 10-25-22.   Recent neurologic symptoms, if any:  Seizures: no Headaches: no Nausea: yes, coming from chemo Dizziness/ataxia: no Difficulty with hand coordination: no Focal numbness/weakness: yes, pt has bilateral leg weakness (does use wheelchair), she is working with physical therapy to use a walker more. She also reports some numbness in her legs as well   Other issues of note: Pt skin is healing well. She also reports her fatigue is improving daily. She overall had no concerns at this time.

## 2022-11-15 MED FILL — Fosaprepitant Dimeglumine For IV Infusion 150 MG (Base Eq): INTRAVENOUS | Qty: 5 | Status: AC

## 2022-11-18 ENCOUNTER — Inpatient Hospital Stay (HOSPITAL_BASED_OUTPATIENT_CLINIC_OR_DEPARTMENT_OTHER): Payer: PPO | Admitting: Physician Assistant

## 2022-11-18 ENCOUNTER — Other Ambulatory Visit: Payer: Self-pay | Admitting: Physician Assistant

## 2022-11-18 ENCOUNTER — Inpatient Hospital Stay: Payer: PPO | Attending: Internal Medicine

## 2022-11-18 ENCOUNTER — Encounter: Payer: Self-pay | Admitting: Internal Medicine

## 2022-11-18 ENCOUNTER — Inpatient Hospital Stay: Payer: PPO

## 2022-11-18 ENCOUNTER — Other Ambulatory Visit: Payer: Self-pay

## 2022-11-18 VITALS — BP 107/63 | HR 97 | Resp 16

## 2022-11-18 VITALS — BP 106/65 | HR 106 | Temp 98.3°F | Resp 16 | Ht 63.0 in

## 2022-11-18 DIAGNOSIS — Z5111 Encounter for antineoplastic chemotherapy: Secondary | ICD-10-CM | POA: Insufficient documentation

## 2022-11-18 DIAGNOSIS — Z79620 Long term (current) use of immunosuppressive biologic: Secondary | ICD-10-CM | POA: Diagnosis not present

## 2022-11-18 DIAGNOSIS — Z5112 Encounter for antineoplastic immunotherapy: Secondary | ICD-10-CM | POA: Insufficient documentation

## 2022-11-18 DIAGNOSIS — Z923 Personal history of irradiation: Secondary | ICD-10-CM | POA: Insufficient documentation

## 2022-11-18 DIAGNOSIS — C3431 Malignant neoplasm of lower lobe, right bronchus or lung: Secondary | ICD-10-CM | POA: Diagnosis not present

## 2022-11-18 DIAGNOSIS — C787 Secondary malignant neoplasm of liver and intrahepatic bile duct: Secondary | ICD-10-CM | POA: Insufficient documentation

## 2022-11-18 DIAGNOSIS — C3491 Malignant neoplasm of unspecified part of right bronchus or lung: Secondary | ICD-10-CM

## 2022-11-18 DIAGNOSIS — M549 Dorsalgia, unspecified: Secondary | ICD-10-CM | POA: Insufficient documentation

## 2022-11-18 DIAGNOSIS — C7951 Secondary malignant neoplasm of bone: Secondary | ICD-10-CM | POA: Diagnosis not present

## 2022-11-18 LAB — CBC WITH DIFFERENTIAL (CANCER CENTER ONLY)
Abs Immature Granulocytes: 0.05 10*3/uL (ref 0.00–0.07)
Basophils Absolute: 0 10*3/uL (ref 0.0–0.1)
Basophils Relative: 1 %
Eosinophils Absolute: 0 10*3/uL (ref 0.0–0.5)
Eosinophils Relative: 1 %
HCT: 40.1 % (ref 36.0–46.0)
Hemoglobin: 13.9 g/dL (ref 12.0–15.0)
Immature Granulocytes: 1 %
Lymphocytes Relative: 5 %
Lymphs Abs: 0.4 10*3/uL — ABNORMAL LOW (ref 0.7–4.0)
MCH: 30.8 pg (ref 26.0–34.0)
MCHC: 34.7 g/dL (ref 30.0–36.0)
MCV: 88.7 fL (ref 80.0–100.0)
Monocytes Absolute: 0.6 10*3/uL (ref 0.1–1.0)
Monocytes Relative: 7 %
Neutro Abs: 7.7 10*3/uL (ref 1.7–7.7)
Neutrophils Relative %: 85 %
Platelet Count: 270 10*3/uL (ref 150–400)
RBC: 4.52 MIL/uL (ref 3.87–5.11)
RDW: 13.7 % (ref 11.5–15.5)
WBC Count: 8.9 10*3/uL (ref 4.0–10.5)
nRBC: 0 % (ref 0.0–0.2)

## 2022-11-18 LAB — CMP (CANCER CENTER ONLY)
ALT: 27 U/L (ref 0–44)
AST: 23 U/L (ref 15–41)
Albumin: 3.7 g/dL (ref 3.5–5.0)
Alkaline Phosphatase: 106 U/L (ref 38–126)
Anion gap: 8 (ref 5–15)
BUN: 8 mg/dL (ref 8–23)
CO2: 28 mmol/L (ref 22–32)
Calcium: 9.2 mg/dL (ref 8.9–10.3)
Chloride: 103 mmol/L (ref 98–111)
Creatinine: 0.48 mg/dL (ref 0.44–1.00)
GFR, Estimated: >60 mL/min (ref >60)
Glucose, Bld: 143 mg/dL — ABNORMAL HIGH (ref 70–99)
Potassium: 3.4 mmol/L — ABNORMAL LOW (ref 3.5–5.1)
Sodium: 139 mmol/L (ref 135–145)
Total Bilirubin: 0.4 mg/dL (ref 0.3–1.2)
Total Protein: 6.5 g/dL (ref 6.5–8.1)

## 2022-11-18 MED ORDER — SODIUM CHLORIDE 0.9 % IV SOLN
150.0000 mg | Freq: Once | INTRAVENOUS | Status: AC
  Administered 2022-11-18: 150 mg via INTRAVENOUS
  Filled 2022-11-18: qty 150

## 2022-11-18 MED ORDER — SODIUM CHLORIDE 0.9 % IV SOLN
200.0000 mg | Freq: Once | INTRAVENOUS | Status: AC
  Administered 2022-11-18: 200 mg via INTRAVENOUS
  Filled 2022-11-18: qty 200

## 2022-11-18 MED ORDER — SODIUM CHLORIDE 0.9 % IV SOLN
10.0000 mg | Freq: Once | INTRAVENOUS | Status: AC
  Administered 2022-11-18: 10 mg via INTRAVENOUS
  Filled 2022-11-18: qty 10

## 2022-11-18 MED ORDER — SODIUM CHLORIDE 0.9 % IV SOLN
500.0000 mg/m2 | Freq: Once | INTRAVENOUS | Status: AC
  Administered 2022-11-18: 900 mg via INTRAVENOUS
  Filled 2022-11-18: qty 20

## 2022-11-18 MED ORDER — PALONOSETRON HCL INJECTION 0.25 MG/5ML
0.2500 mg | Freq: Once | INTRAVENOUS | Status: AC
  Administered 2022-11-18: 0.25 mg via INTRAVENOUS
  Filled 2022-11-18: qty 5

## 2022-11-18 MED ORDER — SODIUM CHLORIDE 0.9 % IV SOLN: Freq: Once | INTRAVENOUS | Status: AC

## 2022-11-18 MED ORDER — SODIUM CHLORIDE 0.9 % IV SOLN
430.5000 mg | Freq: Once | INTRAVENOUS | Status: AC
  Administered 2022-11-18: 430 mg via INTRAVENOUS
  Filled 2022-11-18: qty 43

## 2022-11-18 NOTE — Progress Notes (Signed)
Patient presented documentation at check-in for [employer].  Patient approved for one-time $1000 [employer] grant to assist with personal expenses while going through treatment. She has a copy of the approval letter and expense sheet in green folder. She was advised to contact me at earliest convenience to discuss expense sheet in detail.  She has my card to do so and for any additional financial questions or concerns.

## 2022-11-18 NOTE — Patient Instructions (Signed)
[facility]   Discharge Instructions: Thank you for choosing [facility] to provide your oncology and hematology care.   If you have a lab appointment with the Cancer Center, please go directly to the Cancer Center and check in at the registration area.   Wear comfortable clothing and clothing appropriate for easy access to any Portacath or PICC line.   We strive to give you quality time with your provider. You may need to reschedule your appointment if you arrive late (15 or more minutes).  Arriving late affects you and other patients whose appointments are after yours.  Also, if you miss three or more appointments without notifying the office, you may be dismissed from the clinic at the provider's discretion.      For prescription refill requests, have your pharmacy contact our office and allow 72 hours for refills to be completed.    Today you received the following chemotherapy and/or immunotherapy agents: Pembrolizumab (Keytruda), Pemetrexed (Alimta), and Carboplatin    To help prevent nausea and vomiting after your treatment, we encourage you to take your nausea medication as directed.  BELOW ARE SYMPTOMS THAT SHOULD BE REPORTED IMMEDIATELY: *FEVER GREATER THAN 100.4 F (38 C) OR HIGHER *CHILLS OR SWEATING *NAUSEA AND VOMITING THAT IS NOT CONTROLLED WITH YOUR NAUSEA MEDICATION *UNUSUAL SHORTNESS OF BREATH *UNUSUAL BRUISING OR BLEEDING *URINARY PROBLEMS (pain or burning when urinating, or frequent urination) *BOWEL PROBLEMS (unusual diarrhea, constipation, pain near the anus) TENDERNESS IN MOUTH AND THROAT WITH OR WITHOUT PRESENCE OF ULCERS (sore throat, sores in mouth, or a toothache) UNUSUAL RASH, SWELLING OR PAIN  UNUSUAL VAGINAL DISCHARGE OR ITCHING   Items with * indicate a potential emergency and should be followed up as soon as possible or go to the Emergency Department if any problems should occur.  Please show the  CHEMOTHERAPY ALERT CARD or IMMUNOTHERAPY ALERT CARD at check-in to the Emergency Department and triage nurse.  Should you have questions after your visit or need to cancel or reschedule your appointment, please contact [facility]  Dept: [phone]  and follow the prompts.  Office hours are 8:00 a.m. to 4:30 p.m. Monday - Friday. Please note that voicemails left after 4:00 p.m. may not be returned until the following business day.  We are closed weekends and major holidays. You have access to a nurse at all times for urgent questions. Please call the main number to the clinic Dept: [phone] and follow the prompts.   For any non-urgent questions, you may also contact your provider using MyChart. We now offer e-Visits for anyone 18 and older to request care online for non-urgent symptoms. For details visit [email or web address].   Also download the MyChart app! Go to the app store, search "MyChart", open the app, select [facility], and log in with your MyChart username and password.   

## 2022-11-20 ENCOUNTER — Telehealth: Payer: Self-pay | Admitting: Internal Medicine

## 2022-11-20 NOTE — Telephone Encounter (Signed)
Called patient regarding June/July appointments, patient has been called and nitrified.

## 2022-11-22 ENCOUNTER — Other Ambulatory Visit: Payer: Self-pay

## 2022-11-22 DIAGNOSIS — C3491 Malignant neoplasm of unspecified part of right bronchus or lung: Secondary | ICD-10-CM

## 2022-11-25 ENCOUNTER — Other Ambulatory Visit: Payer: Self-pay

## 2022-11-25 ENCOUNTER — Inpatient Hospital Stay: Payer: PPO

## 2022-11-25 DIAGNOSIS — Z5112 Encounter for antineoplastic immunotherapy: Secondary | ICD-10-CM | POA: Diagnosis not present

## 2022-11-25 DIAGNOSIS — C3491 Malignant neoplasm of unspecified part of right bronchus or lung: Secondary | ICD-10-CM

## 2022-11-25 LAB — CBC WITH DIFFERENTIAL/PLATELET
Abs Immature Granulocytes: 0.02 10*3/uL (ref 0.00–0.07)
Basophils Absolute: 0 10*3/uL (ref 0.0–0.1)
Basophils Relative: 1 %
Eosinophils Absolute: 0.2 10*3/uL (ref 0.0–0.5)
Eosinophils Relative: 5 %
HCT: 39.9 % (ref 36.0–46.0)
Hemoglobin: 13.7 g/dL (ref 12.0–15.0)
Immature Granulocytes: 1 %
Lymphocytes Relative: 11 %
Lymphs Abs: 0.4 10*3/uL — ABNORMAL LOW (ref 0.7–4.0)
MCH: 30.5 pg (ref 26.0–34.0)
MCHC: 34.3 g/dL (ref 30.0–36.0)
MCV: 88.9 fL (ref 80.0–100.0)
Monocytes Absolute: 0.3 10*3/uL (ref 0.1–1.0)
Monocytes Relative: 7 %
Neutro Abs: 3 10*3/uL (ref 1.7–7.7)
Neutrophils Relative %: 75 %
Platelets: 107 10*3/uL — ABNORMAL LOW (ref 150–400)
RBC: 4.49 MIL/uL (ref 3.87–5.11)
RDW: 13.1 % (ref 11.5–15.5)
WBC: 4 10*3/uL (ref 4.0–10.5)
nRBC: 0 % (ref 0.0–0.2)

## 2022-11-25 LAB — COMPREHENSIVE METABOLIC PANEL
ALT: 45 U/L — ABNORMAL HIGH (ref 0–44)
AST: 29 U/L (ref 15–41)
Albumin: 3.9 g/dL (ref 3.5–5.0)
Alkaline Phosphatase: 115 U/L (ref 38–126)
Anion gap: 10 (ref 5–15)
BUN: 12 mg/dL (ref 8–23)
CO2: 29 mmol/L (ref 22–32)
Calcium: 9.6 mg/dL (ref 8.9–10.3)
Chloride: 98 mmol/L (ref 98–111)
Creatinine, Ser: 0.44 mg/dL (ref 0.44–1.00)
GFR, Estimated: >60 mL/min (ref >60)
Glucose, Bld: 112 mg/dL — ABNORMAL HIGH (ref 70–99)
Potassium: 3.5 mmol/L (ref 3.5–5.1)
Sodium: 137 mmol/L (ref 135–145)
Total Bilirubin: 0.5 mg/dL (ref 0.3–1.2)
Total Protein: 6.5 g/dL (ref 6.5–8.1)

## 2022-11-25 LAB — TSH: TSH: 6.158 u[IU]/mL — ABNORMAL HIGH (ref 0.350–4.500)

## 2022-11-26 ENCOUNTER — Ambulatory Visit: Payer: Self-pay | Admitting: Radiation Oncology

## 2022-11-26 ENCOUNTER — Inpatient Hospital Stay: Payer: PPO | Admitting: Licensed Clinical Social Worker

## 2022-11-26 DIAGNOSIS — C3491 Malignant neoplasm of unspecified part of right bronchus or lung: Secondary | ICD-10-CM

## 2022-11-26 LAB — T4: T4, Total: 10.6 ug/dL (ref 4.5–12.0)

## 2022-11-26 NOTE — Progress Notes (Signed)
[facility] CSW Progress Note  Clinical Child psychotherapist contacted caregiver by phone to discuss community support services which may be available to pt.  Pt's daughter Hayley Wu) is employed as a Runner, broadcasting/film/video and will be off over the summer, but will need to return to work in the spring.  Per daughter pt's ability to complete ADLs independently is significantly compromised and it is not anticipated pt will recover to the extent she will regain independence.  Pt currently is receiving PT through her home care referral, which is anticipated to be extended, but is the only service pt is determined to be eligible for.  Pt's monthly income exceeds the limit for pt to qualify for Medicaid.  CSW suggested pt's daughter speak with [facility] [employer] as they would be able to more completely assess pt's resources and determine what services pt would be eligible for to accommodate her needs.  Contact information given to pt's daughter and Karen Kays informed of referral.  CSW to remain available throughout duration of treatment to provide support as appropriate.     Rachel Moulds, LCSW Clinical Social Worker [facility]

## 2022-11-27 DIAGNOSIS — M8448XD Pathological fracture, other site, subsequent encounter for fracture with routine healing: Secondary | ICD-10-CM | POA: Diagnosis not present

## 2022-11-27 DIAGNOSIS — R918 Other nonspecific abnormal finding of lung field: Secondary | ICD-10-CM | POA: Diagnosis not present

## 2022-11-27 DIAGNOSIS — K8000 Calculus of gallbladder with acute cholecystitis without obstruction: Secondary | ICD-10-CM | POA: Diagnosis not present

## 2022-11-27 DIAGNOSIS — Z4789 Encounter for other orthopedic aftercare: Secondary | ICD-10-CM | POA: Diagnosis not present

## 2022-11-27 DIAGNOSIS — E039 Hypothyroidism, unspecified: Secondary | ICD-10-CM | POA: Diagnosis not present

## 2022-11-27 DIAGNOSIS — R29898 Other symptoms and signs involving the musculoskeletal system: Secondary | ICD-10-CM | POA: Diagnosis not present

## 2022-11-27 DIAGNOSIS — Z79891 Long term (current) use of opiate analgesic: Secondary | ICD-10-CM | POA: Diagnosis not present

## 2022-11-27 DIAGNOSIS — G9520 Unspecified cord compression: Secondary | ICD-10-CM | POA: Diagnosis not present

## 2022-11-27 DIAGNOSIS — C7951 Secondary malignant neoplasm of bone: Secondary | ICD-10-CM | POA: Diagnosis not present

## 2022-11-27 DIAGNOSIS — I10 Essential (primary) hypertension: Secondary | ICD-10-CM | POA: Diagnosis not present

## 2022-11-27 DIAGNOSIS — D492 Neoplasm of unspecified behavior of bone, soft tissue, and skin: Secondary | ICD-10-CM | POA: Diagnosis not present

## 2022-11-27 DIAGNOSIS — Z720 Tobacco use: Secondary | ICD-10-CM | POA: Diagnosis not present

## 2022-11-28 ENCOUNTER — Encounter: Payer: Self-pay | Admitting: Radiation Oncology

## 2022-11-28 ENCOUNTER — Ambulatory Visit
Admission: RE | Admit: 2022-11-28 | Discharge: 2022-11-28 | Disposition: A | Discharge status: Home / Self Care | Payer: PPO | Source: Ambulatory Visit | Attending: Radiation Oncology | Admitting: Radiation Oncology

## 2022-11-28 ENCOUNTER — Telehealth: Payer: Self-pay

## 2022-11-28 NOTE — Telephone Encounter (Signed)
Pt called for telephone follow up. Note completed and routed to Dr. Squire for review.  

## 2022-12-02 ENCOUNTER — Inpatient Hospital Stay: Payer: PPO

## 2022-12-02 DIAGNOSIS — C3491 Malignant neoplasm of unspecified part of right bronchus or lung: Secondary | ICD-10-CM

## 2022-12-02 DIAGNOSIS — Z5112 Encounter for antineoplastic immunotherapy: Secondary | ICD-10-CM | POA: Diagnosis not present

## 2022-12-02 LAB — CBC WITH DIFFERENTIAL/PLATELET
Abs Immature Granulocytes: 0.02 10*3/uL (ref 0.00–0.07)
Basophils Absolute: 0 10*3/uL (ref 0.0–0.1)
Basophils Relative: 1 %
Eosinophils Absolute: 0.1 10*3/uL (ref 0.0–0.5)
Eosinophils Relative: 2 %
HCT: 38.7 % (ref 36.0–46.0)
Hemoglobin: 13.3 g/dL (ref 12.0–15.0)
Immature Granulocytes: 1 %
Lymphocytes Relative: 11 %
Lymphs Abs: 0.4 10*3/uL — ABNORMAL LOW (ref 0.7–4.0)
MCH: 31 pg (ref 26.0–34.0)
MCHC: 34.4 g/dL (ref 30.0–36.0)
MCV: 90.2 fL (ref 80.0–100.0)
Monocytes Absolute: 0.5 10*3/uL (ref 0.1–1.0)
Monocytes Relative: 14 %
Neutro Abs: 2.7 10*3/uL (ref 1.7–7.7)
Neutrophils Relative %: 71 %
Platelets: 156 10*3/uL (ref 150–400)
RBC: 4.29 MIL/uL (ref 3.87–5.11)
RDW: 14 % (ref 11.5–15.5)
WBC: 3.8 10*3/uL — ABNORMAL LOW (ref 4.0–10.5)
nRBC: 0 % (ref 0.0–0.2)

## 2022-12-02 LAB — COMPREHENSIVE METABOLIC PANEL
ALT: 51 U/L — ABNORMAL HIGH (ref 0–44)
AST: 33 U/L (ref 15–41)
Albumin: 3.9 g/dL (ref 3.5–5.0)
Alkaline Phosphatase: 118 U/L (ref 38–126)
Anion gap: 8 (ref 5–15)
BUN: 8 mg/dL (ref 8–23)
CO2: 32 mmol/L (ref 22–32)
Calcium: 9.6 mg/dL (ref 8.9–10.3)
Chloride: 100 mmol/L (ref 98–111)
Creatinine, Ser: 0.54 mg/dL (ref 0.44–1.00)
GFR, Estimated: >60 mL/min (ref >60)
Glucose, Bld: 115 mg/dL — ABNORMAL HIGH (ref 70–99)
Potassium: 3.4 mmol/L — ABNORMAL LOW (ref 3.5–5.1)
Sodium: 140 mmol/L (ref 135–145)
Total Bilirubin: 0.4 mg/dL (ref 0.3–1.2)
Total Protein: 6.5 g/dL (ref 6.5–8.1)

## 2022-12-06 MED FILL — Fosaprepitant Dimeglumine For IV Infusion 150 MG (Base Eq): INTRAVENOUS | Qty: 5 | Status: AC

## 2022-12-09 ENCOUNTER — Other Ambulatory Visit: Payer: Self-pay

## 2022-12-09 ENCOUNTER — Inpatient Hospital Stay: Payer: PPO | Attending: Internal Medicine

## 2022-12-09 ENCOUNTER — Inpatient Hospital Stay: Payer: PPO

## 2022-12-09 ENCOUNTER — Inpatient Hospital Stay (HOSPITAL_BASED_OUTPATIENT_CLINIC_OR_DEPARTMENT_OTHER): Payer: PPO | Admitting: Internal Medicine

## 2022-12-09 ENCOUNTER — Other Ambulatory Visit: Payer: Self-pay | Admitting: Medical Oncology

## 2022-12-09 VITALS — BP 106/66 | HR 96 | Temp 97.6°F | Resp 17 | Ht 63.0 in | Wt 148.7 lb

## 2022-12-09 DIAGNOSIS — C3431 Malignant neoplasm of lower lobe, right bronchus or lung: Secondary | ICD-10-CM | POA: Diagnosis not present

## 2022-12-09 DIAGNOSIS — C3490 Malignant neoplasm of unspecified part of unspecified bronchus or lung: Secondary | ICD-10-CM | POA: Diagnosis not present

## 2022-12-09 DIAGNOSIS — C3491 Malignant neoplasm of unspecified part of right bronchus or lung: Secondary | ICD-10-CM | POA: Diagnosis not present

## 2022-12-09 DIAGNOSIS — Z5111 Encounter for antineoplastic chemotherapy: Secondary | ICD-10-CM | POA: Diagnosis not present

## 2022-12-09 DIAGNOSIS — Z79620 Long term (current) use of immunosuppressive biologic: Secondary | ICD-10-CM | POA: Insufficient documentation

## 2022-12-09 DIAGNOSIS — Z452 Encounter for adjustment and management of vascular access device: Secondary | ICD-10-CM | POA: Diagnosis not present

## 2022-12-09 DIAGNOSIS — I878 Other specified disorders of veins: Secondary | ICD-10-CM

## 2022-12-09 DIAGNOSIS — C7951 Secondary malignant neoplasm of bone: Secondary | ICD-10-CM | POA: Insufficient documentation

## 2022-12-09 DIAGNOSIS — Z5112 Encounter for antineoplastic immunotherapy: Secondary | ICD-10-CM | POA: Diagnosis not present

## 2022-12-09 DIAGNOSIS — C787 Secondary malignant neoplasm of liver and intrahepatic bile duct: Secondary | ICD-10-CM | POA: Insufficient documentation

## 2022-12-09 LAB — CBC WITH DIFFERENTIAL (CANCER CENTER ONLY)
Abs Immature Granulocytes: 0.03 10*3/uL (ref 0.00–0.07)
Basophils Absolute: 0 10*3/uL (ref 0.0–0.1)
Basophils Relative: 1 %
Eosinophils Absolute: 0.1 10*3/uL (ref 0.0–0.5)
Eosinophils Relative: 2 %
HCT: 38.3 % (ref 36.0–46.0)
Hemoglobin: 12.9 g/dL (ref 12.0–15.0)
Immature Granulocytes: 1 %
Lymphocytes Relative: 9 %
Lymphs Abs: 0.4 10*3/uL — ABNORMAL LOW (ref 0.7–4.0)
MCH: 30.8 pg (ref 26.0–34.0)
MCHC: 33.7 g/dL (ref 30.0–36.0)
MCV: 91.4 fL (ref 80.0–100.0)
Monocytes Absolute: 0.5 10*3/uL (ref 0.1–1.0)
Monocytes Relative: 10 %
Neutro Abs: 3.7 10*3/uL (ref 1.7–7.7)
Neutrophils Relative %: 77 %
Platelet Count: 186 10*3/uL (ref 150–400)
RBC: 4.19 MIL/uL (ref 3.87–5.11)
RDW: 14.5 % (ref 11.5–15.5)
WBC Count: 4.7 10*3/uL (ref 4.0–10.5)
nRBC: 0 % (ref 0.0–0.2)

## 2022-12-09 LAB — CMP (CANCER CENTER ONLY)
ALT: 34 U/L (ref 0–44)
AST: 22 U/L (ref 15–41)
Albumin: 3.8 g/dL (ref 3.5–5.0)
Alkaline Phosphatase: 111 U/L (ref 38–126)
Anion gap: 9 (ref 5–15)
BUN: 7 mg/dL — ABNORMAL LOW (ref 8–23)
CO2: 28 mmol/L (ref 22–32)
Calcium: 8.9 mg/dL (ref 8.9–10.3)
Chloride: 102 mmol/L (ref 98–111)
Creatinine: 0.49 mg/dL (ref 0.44–1.00)
GFR, Estimated: >60 mL/min (ref >60)
Glucose, Bld: 107 mg/dL — ABNORMAL HIGH (ref 70–99)
Potassium: 3.4 mmol/L — ABNORMAL LOW (ref 3.5–5.1)
Sodium: 139 mmol/L (ref 135–145)
Total Bilirubin: 0.4 mg/dL (ref 0.3–1.2)
Total Protein: 6.9 g/dL (ref 6.5–8.1)

## 2022-12-09 LAB — TSH: TSH: 6.749 u[IU]/mL — ABNORMAL HIGH (ref 0.350–4.500)

## 2022-12-09 MED ORDER — SODIUM CHLORIDE 0.9 % IV SOLN: Freq: Once | INTRAVENOUS | Status: AC

## 2022-12-09 MED ORDER — PALONOSETRON HCL INJECTION 0.25 MG/5ML
0.2500 mg | Freq: Once | INTRAVENOUS | Status: AC
  Administered 2022-12-09: 0.25 mg via INTRAVENOUS
  Filled 2022-12-09: qty 5

## 2022-12-09 MED ORDER — SODIUM CHLORIDE 0.9 % IV SOLN
200.0000 mg | Freq: Once | INTRAVENOUS | Status: AC
  Administered 2022-12-09: 200 mg via INTRAVENOUS
  Filled 2022-12-09: qty 200

## 2022-12-09 MED ORDER — SODIUM CHLORIDE 0.9 % IV SOLN
150.0000 mg | Freq: Once | INTRAVENOUS | Status: AC
  Administered 2022-12-09: 150 mg via INTRAVENOUS
  Filled 2022-12-09: qty 150

## 2022-12-09 MED ORDER — CYANOCOBALAMIN 1000 MCG/ML IJ SOLN
1000.0000 ug | Freq: Once | INTRAMUSCULAR | Status: AC
  Administered 2022-12-09: 1000 ug via INTRAMUSCULAR
  Filled 2022-12-09: qty 1

## 2022-12-09 MED ORDER — SODIUM CHLORIDE 0.9 % IV SOLN
430.5000 mg | Freq: Once | INTRAVENOUS | Status: AC
  Administered 2022-12-09: 430 mg via INTRAVENOUS
  Filled 2022-12-09: qty 43

## 2022-12-09 MED ORDER — SODIUM CHLORIDE 0.9 % IV SOLN
500.0000 mg/m2 | Freq: Once | INTRAVENOUS | Status: AC
  Administered 2022-12-09: 900 mg via INTRAVENOUS
  Filled 2022-12-09: qty 20

## 2022-12-09 MED ORDER — SODIUM CHLORIDE 0.9 % IV SOLN
10.0000 mg | Freq: Once | INTRAVENOUS | Status: AC
  Administered 2022-12-09: 10 mg via INTRAVENOUS
  Filled 2022-12-09: qty 10

## 2022-12-09 NOTE — Progress Notes (Signed)
[facility] [phone]   [phone]  OFFICE PROGRESS NOTE  [name], PA-C [address] [place] [address]  DIAGNOSIS: Stage IV (T1b, N2, M1 C) non-small cell lung cancer, adenocarcinoma presented with right lower lobe lung nodule in addition to small bilateral pulmonary nodules and right hilar and mediastinal lymphadenopathy in addition to liver and bone metastasis diagnosed in April 2024. 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   PDL1: 20%   Guardant 360: no actionable mutations.    PRIOR THERAPY: 1) Decompressive thoracic laminectomy of the T7 with spinal cord decompression and biopsy of the epidural mass in April 2024.  2) Palliative radiation to the bone under the care of Dr. Basilio Cairo. Last day scheduled for 10/25/22    CURRENT THERAPY: Carboplatin for an AUC of 5, Alimta 500 mg/m, Keytruda 200 mg IV every 3 weeks.  First dose expected on 10/28/2022. Status post 2 cycles.   INTERVAL HISTORY: Hayley Wu 72 y.o. female returns to the clinic today for follow-up visit accompanied by her daughter.  The patient is feeling fine today with no concerning complaints.  She has been tolerating her treatment fairly well with no significant adverse effects. She denied having any current chest pain, shortness of breath, cough or hemoptysis.  She has no nausea, vomiting, diarrhea or constipation.  She denied having any headache or visual changes.  The patient has no weight loss or night sweats.  She is here today for evaluation before starting cycle #3 of her treatment.  MEDICAL HISTORY: Past Medical History:  Diagnosis Date   Allergy    Contact lens/glasses fitting    Hypertension     ALLERGIES:  is allergic to erythromycin, amoxicillin, latex, and prednisone.  MEDICATIONS:  Current Outpatient Medications  Medication Sig Dispense  Refill   oxyCODONE (OXY IR/ROXICODONE) 5 MG immediate release tablet Take 1 tablet (5 mg total) by mouth every 3 (three) hours as needed for moderate pain ((score 4 to 6)). 30 tablet 0   prochlorperazine (COMPAZINE) 10 MG tablet Take 1 tablet (10 mg total) by mouth every 6 (six) hours as needed. 30 tablet 2   acetaminophen (TYLENOL) 500 MG tablet Take 500 mg by mouth every 6 (six) hours as needed for moderate pain.     amLODipine (NORVASC) 10 MG tablet Take 10 mg by mouth daily.     folic acid (FOLVITE) 1 MG tablet Take 1 tablet (1 mg total) by mouth daily. 30 tablet 2   levothyroxine (SYNTHROID) 100 MCG tablet Take 1 tablet (100 mcg total) by mouth daily at 6 (six) AM. 30 tablet 2   lidocaine (XYLOCAINE) 2 % solution Patient: Mix 1part 2% viscous lidocaine, 1part H20. Swallow 10mL of diluted mixture, before meals and at bedtime, up to QID (Patient not taking: Reported on 12/09/2022) 200 mL 2   losartan (COZAAR) 100 MG tablet Take 100 mg by mouth daily.     senna (SENOKOT) 8.6 MG TABS tablet Take 1 tablet (8.6 mg total) by mouth 2 (two) times daily. 120 tablet 0   VITAMIN D PO Take 1 tablet by mouth daily.     No current facility-administered medications for this visit.    SURGICAL HISTORY:  Past Surgical History:  Procedure Laterality Date   ABDOMINAL HYSTERECTOMY     BREAST SURGERY  2010   rt lumpectomy-neg  LAMINECTOMY N/A 09/21/2022   Procedure: THORACIC LAMINECTOMY FOR TUMOR;  Surgeon: Tia Alert, MD;  Location: [facility] OR;  Service: Neurosurgery;  Laterality: N/A;   OPEN REDUCTION INTERNAL FIXATION (ORIF) DISTAL RADIAL FRACTURE Left 11/26/2013   Procedure: OPEN REDUCTION INTERNAL FIXATION (ORIF) LEFT  DISTAL RADIUS ;  Surgeon: Nestor Lewandowsky, MD;  Location: [facility];  Service: Orthopedics;  Laterality: Left;  with block   TUBAL LIGATION      REVIEW OF SYSTEMS:  A comprehensive review of systems was negative except for: Constitutional: positive for fatigue    PHYSICAL EXAMINATION: General appearance: alert, cooperative, and no distress Head: Normocephalic, without obvious abnormality, atraumatic Neck: no adenopathy, no JVD, supple, symmetrical, trachea midline, and thyroid not enlarged, symmetric, no tenderness/mass/nodules Lymph nodes: Cervical, supraclavicular, and axillary nodes normal. Resp: clear to auscultation bilaterally Back: symmetric, no curvature. ROM normal. No CVA tenderness. Cardio: regular rate and rhythm, S1, S2 normal, no murmur, click, rub or gallop GI: soft, non-tender; bowel sounds normal; no masses,  no organomegaly Extremities: extremities normal, atraumatic, no cyanosis or edema  ECOG PERFORMANCE STATUS: 1 - Symptomatic but completely ambulatory  Blood pressure 106/66, pulse 96, temperature 97.6 F (36.4 C), temperature source Oral, resp. rate 17, height 5\' 3"  (1.6 m), weight 148 lb 11.2 oz (67.4 kg), SpO2 98 %.  LABORATORY DATA: Lab Results  Component Value Date   WBC 4.7 12/09/2022   HGB 12.9 12/09/2022   HCT 38.3 12/09/2022   MCV 91.4 12/09/2022   PLT 186 12/09/2022      Chemistry      Component Value Date/Time   NA 139 12/09/2022 1030   K 3.4 (L) 12/09/2022 1030   CL 102 12/09/2022 1030   CO2 28 12/09/2022 1030   BUN 7 (L) 12/09/2022 1030   CREATININE 0.49 12/09/2022 1030      Component Value Date/Time   CALCIUM 8.9 12/09/2022 1030   ALKPHOS 111 12/09/2022 1030   AST 22 12/09/2022 1030   ALT 34 12/09/2022 1030   BILITOT 0.4 12/09/2022 1030       RADIOGRAPHIC STUDIES: No results found.  ASSESSMENT AND PLAN: This is a very pleasant 72 years old white female with Stage IV (T1b, N2, M1 C) non-small cell lung cancer, adenocarcinoma presented with right lower lobe lung nodule in addition to small bilateral pulmonary nodules and right hilar and mediastinal lymphadenopathy in addition to liver and bone metastasis diagnosed in April 2024. She is status post decompressive thoracic laminectomy of the T7  with spinal cord decompression and biopsy of the epidural mass in April 2024. Molecular studies showed no actionable mutations and PD-L1 expression of 20%. 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  She also had palliative radiation to the bone under the care of Dr. Basilio Cairo. Last day scheduled for 10/25/22. She is currently on palliative systemic chemotherapy with carboplatin for AUC of 5, Alimta 500 Mg/M2 and Keytruda 200 Mg IV every 3 weeks status post 2 cycles.  She has been tolerating this treatment well with no concerning adverse effects. I recommended for the patient to proceed with cycle #3 today as planned. I will see her back for follow-up visit in 3 weeks for evaluation with repeat CT scan of the chest, abdomen and pelvis before starting cycle #4. The patient was advised to call immediately if she has any other concerning symptoms in the interval. The patient  voices understanding of current disease status and treatment options and is in agreement with the current care plan.  All questions were answered. The patient knows to call the clinic with any problems, questions or concerns. We can certainly see the patient much sooner if necessary.  The total time spent in the appointment was 20 minutes.  Disclaimer: This note was dictated with voice recognition software. Similar sounding words can inadvertently be transcribed and may not be corrected upon review.

## 2022-12-09 NOTE — Progress Notes (Signed)
Per pt request , a port a cath order was requested of Dr. Arbutus Ped.

## 2022-12-09 NOTE — Patient Instructions (Signed)
[facility]  Discharge Instructions: Thank you for choosing [facility] to provide your oncology and hematology care.   If you have a lab appointment with the Cancer Center, please go directly to the Cancer Center and check in at the registration area.   Wear comfortable clothing and clothing appropriate for easy access to any Portacath or PICC line.   We strive to give you quality time with your provider. You may need to reschedule your appointment if you arrive late (15 or more minutes).  Arriving late affects you and other patients whose appointments are after yours.  Also, if you miss three or more appointments without notifying the office, you may be dismissed from the clinic at the provider's discretion.      For prescription refill requests, have your pharmacy contact our office and allow 72 hours for refills to be completed.    Today you received the following chemotherapy and/or immunotherapy agents Keytruda, alimta, carboplatin.      To help prevent nausea and vomiting after your treatment, we encourage you to take your nausea medication as directed.  BELOW ARE SYMPTOMS THAT SHOULD BE REPORTED IMMEDIATELY: *FEVER GREATER THAN 100.4 F (38 C) OR HIGHER *CHILLS OR SWEATING *NAUSEA AND VOMITING THAT IS NOT CONTROLLED WITH YOUR NAUSEA MEDICATION *UNUSUAL SHORTNESS OF BREATH *UNUSUAL BRUISING OR BLEEDING *URINARY PROBLEMS (pain or burning when urinating, or frequent urination) *BOWEL PROBLEMS (unusual diarrhea, constipation, pain near the anus) TENDERNESS IN MOUTH AND THROAT WITH OR WITHOUT PRESENCE OF ULCERS (sore throat, sores in mouth, or a toothache) UNUSUAL RASH, SWELLING OR PAIN  UNUSUAL VAGINAL DISCHARGE OR ITCHING   Items with * indicate a potential emergency and should be followed up as soon as possible or go to the Emergency Department if any problems should occur.  Please show the CHEMOTHERAPY ALERT CARD or IMMUNOTHERAPY  ALERT CARD at check-in to the Emergency Department and triage nurse.  Should you have questions after your visit or need to cancel or reschedule your appointment, please contact [facility]  Dept: [phone]  and follow the prompts.  Office hours are 8:00 a.m. to 4:30 p.m. Monday - Friday. Please note that voicemails left after 4:00 p.m. may not be returned until the following business day.  We are closed weekends and major holidays. You have access to a nurse at all times for urgent questions. Please call the main number to the clinic Dept: [phone] and follow the prompts.   For any non-urgent questions, you may also contact your provider using MyChart. We now offer e-Visits for anyone 18 and older to request care online for non-urgent symptoms. For details visit [email or web address].   Also download the MyChart app! Go to the app store, search "MyChart", open the app, select [place], and log in with your MyChart username and password.   

## 2022-12-10 LAB — T4: T4, Total: 10.4 ug/dL (ref 4.5–12.0)

## 2022-12-14 ENCOUNTER — Other Ambulatory Visit: Payer: Self-pay

## 2022-12-16 ENCOUNTER — Inpatient Hospital Stay: Payer: PPO

## 2022-12-16 DIAGNOSIS — C3491 Malignant neoplasm of unspecified part of right bronchus or lung: Secondary | ICD-10-CM

## 2022-12-16 DIAGNOSIS — Z5112 Encounter for antineoplastic immunotherapy: Secondary | ICD-10-CM | POA: Diagnosis not present

## 2022-12-16 LAB — CBC WITH DIFFERENTIAL/PLATELET
Abs Immature Granulocytes: 0.01 10*3/uL (ref 0.00–0.07)
Basophils Absolute: 0 10*3/uL (ref 0.0–0.1)
Basophils Relative: 1 %
Eosinophils Absolute: 0.1 10*3/uL (ref 0.0–0.5)
Eosinophils Relative: 3 %
HCT: 37.6 % (ref 36.0–46.0)
Hemoglobin: 12.7 g/dL (ref 12.0–15.0)
Immature Granulocytes: 0 %
Lymphocytes Relative: 15 %
Lymphs Abs: 0.5 10*3/uL — ABNORMAL LOW (ref 0.7–4.0)
MCH: 30.8 pg (ref 26.0–34.0)
MCHC: 33.8 g/dL (ref 30.0–36.0)
MCV: 91 fL (ref 80.0–100.0)
Monocytes Absolute: 0.3 10*3/uL (ref 0.1–1.0)
Monocytes Relative: 9 %
Neutro Abs: 2.4 10*3/uL (ref 1.7–7.7)
Neutrophils Relative %: 72 %
Platelets: 84 10*3/uL — ABNORMAL LOW (ref 150–400)
RBC: 4.13 MIL/uL (ref 3.87–5.11)
RDW: 13.5 % (ref 11.5–15.5)
WBC: 3.3 10*3/uL — ABNORMAL LOW (ref 4.0–10.5)
nRBC: 0 % (ref 0.0–0.2)

## 2022-12-16 LAB — COMPREHENSIVE METABOLIC PANEL
ALT: 52 U/L — ABNORMAL HIGH (ref 0–44)
AST: 27 U/L (ref 15–41)
Albumin: 3.9 g/dL (ref 3.5–5.0)
Alkaline Phosphatase: 113 U/L (ref 38–126)
Anion gap: 8 (ref 5–15)
BUN: 11 mg/dL (ref 8–23)
CO2: 32 mmol/L (ref 22–32)
Calcium: 9.5 mg/dL (ref 8.9–10.3)
Chloride: 98 mmol/L (ref 98–111)
Creatinine, Ser: 0.57 mg/dL (ref 0.44–1.00)
GFR, Estimated: >60 mL/min (ref >60)
Glucose, Bld: 100 mg/dL — ABNORMAL HIGH (ref 70–99)
Potassium: 3.4 mmol/L — ABNORMAL LOW (ref 3.5–5.1)
Sodium: 138 mmol/L (ref 135–145)
Total Bilirubin: 0.4 mg/dL (ref 0.3–1.2)
Total Protein: 6.9 g/dL (ref 6.5–8.1)

## 2022-12-17 ENCOUNTER — Other Ambulatory Visit: Payer: Self-pay | Admitting: Physician Assistant

## 2022-12-17 DIAGNOSIS — Z01818 Encounter for other preprocedural examination: Secondary | ICD-10-CM

## 2022-12-18 ENCOUNTER — Ambulatory Visit (HOSPITAL_COMMUNITY)
Admission: RE | Admit: 2022-12-18 | Discharge: 2022-12-18 | Disposition: A | Discharge status: Home / Self Care | Payer: PPO | Source: Ambulatory Visit | Attending: Internal Medicine | Admitting: Internal Medicine

## 2022-12-18 ENCOUNTER — Other Ambulatory Visit: Payer: Self-pay

## 2022-12-18 ENCOUNTER — Encounter (HOSPITAL_COMMUNITY): Payer: Self-pay

## 2022-12-18 DIAGNOSIS — F17210 Nicotine dependence, cigarettes, uncomplicated: Secondary | ICD-10-CM | POA: Insufficient documentation

## 2022-12-18 DIAGNOSIS — C3491 Malignant neoplasm of unspecified part of right bronchus or lung: Secondary | ICD-10-CM | POA: Diagnosis not present

## 2022-12-18 DIAGNOSIS — I878 Other specified disorders of veins: Secondary | ICD-10-CM | POA: Diagnosis not present

## 2022-12-18 DIAGNOSIS — C3490 Malignant neoplasm of unspecified part of unspecified bronchus or lung: Secondary | ICD-10-CM | POA: Diagnosis not present

## 2022-12-18 DIAGNOSIS — I10 Essential (primary) hypertension: Secondary | ICD-10-CM | POA: Diagnosis not present

## 2022-12-18 DIAGNOSIS — Z01818 Encounter for other preprocedural examination: Secondary | ICD-10-CM

## 2022-12-18 DIAGNOSIS — C7951 Secondary malignant neoplasm of bone: Secondary | ICD-10-CM | POA: Insufficient documentation

## 2022-12-18 HISTORY — PX: IR IMAGING GUIDED PORT INSERTION: IMG5740

## 2022-12-18 HISTORY — PX: IR FLUORO GUIDE CV LINE RIGHT: IMG2283

## 2022-12-18 MED ORDER — HEPARIN SOD (PORK) LOCK FLUSH 100 UNIT/ML IV SOLN
INTRAVENOUS | Status: AC
  Filled 2022-12-18: qty 5

## 2022-12-18 MED ORDER — MIDAZOLAM HCL 2 MG/2ML IJ SOLN
INTRAMUSCULAR | Status: AC
  Filled 2022-12-18: qty 2

## 2022-12-18 MED ORDER — FENTANYL CITRATE (PF) 100 MCG/2ML IJ SOLN
INTRAMUSCULAR | Status: AC | PRN
  Administered 2022-12-18 (×2): 50 ug via INTRAVENOUS

## 2022-12-18 MED ORDER — FENTANYL CITRATE (PF) 100 MCG/2ML IJ SOLN
INTRAMUSCULAR | Status: AC
  Filled 2022-12-18: qty 2

## 2022-12-18 MED ORDER — SODIUM CHLORIDE 0.9 % IV SOLN: INTRAVENOUS | Status: DC

## 2022-12-18 MED ORDER — HEPARIN SOD (PORK) LOCK FLUSH 100 UNIT/ML IV SOLN
500.0000 [IU] | Freq: Once | INTRAVENOUS | Status: AC
  Administered 2022-12-18: 500 [IU] via INTRAVENOUS

## 2022-12-18 MED ORDER — LIDOCAINE-EPINEPHRINE 1 %-1:100000 IJ SOLN
20.0000 mL | Freq: Once | INTRAMUSCULAR | Status: AC
  Administered 2022-12-18: 15 mL via INTRADERMAL

## 2022-12-18 MED ORDER — LIDOCAINE-EPINEPHRINE 1 %-1:100000 IJ SOLN
INTRAMUSCULAR | Status: AC
  Filled 2022-12-18: qty 1

## 2022-12-18 MED ORDER — MIDAZOLAM HCL 2 MG/2ML IJ SOLN
INTRAMUSCULAR | Status: AC | PRN
  Administered 2022-12-18 (×2): 1 mg via INTRAVENOUS

## 2022-12-18 NOTE — H&P (Signed)
Chief Complaint: Patient was seen in consultation today for port placement  Referring Physician(s): Mohamed,Mohamed  Supervising Physician: Ruel Favors  Patient Status: [facility] - Out-pt  History of Present Illness: Hayley Wu is a 72 y.o. female with PMH significant for hypertension and stage IV adenocarcinoma of the right lung being seen today in relation to a request for port placement. Patient has been under the care of Dr Arbutus Ped for her lung cancer and has received several rounds of chemotherapy through peripheral IV access and has also previously undergone palliative radiation therapy for bone metastases under Dr Basilio Cairo. Patient has been referred to IR for image-guided port placement to help facilitate continued chemotherapy.  Past Medical History:  Diagnosis Date   Allergy    Contact lens/glasses fitting    Hypertension     Past Surgical History:  Procedure Laterality Date   ABDOMINAL HYSTERECTOMY     BREAST SURGERY  2010   rt lumpectomy-neg   LAMINECTOMY N/A 09/21/2022   Procedure: THORACIC LAMINECTOMY FOR TUMOR;  Surgeon: Tia Alert, MD;  Location: [facility];  Service: Neurosurgery;  Laterality: N/A;   OPEN REDUCTION INTERNAL FIXATION (ORIF) DISTAL RADIAL FRACTURE Left 11/26/2013   Procedure: OPEN REDUCTION INTERNAL FIXATION (ORIF) LEFT  DISTAL RADIUS ;  Surgeon: Nestor Lewandowsky, MD;  Location: [facility];  Service: Orthopedics;  Laterality: Left;  with block   TUBAL LIGATION      Allergies: Erythromycin, Amoxicillin, Latex, and Prednisone  Medications: Prior to Admission medications   Medication Sig Start Date End Date Taking? Authorizing Provider  acetaminophen (TYLENOL) 500 MG tablet Take 500 mg by mouth every 6 (six) hours as needed for moderate pain.   Yes [provider]  amLODipine (NORVASC) 10 MG tablet Take 10 mg by mouth daily.   Yes [provider]  folic acid (FOLVITE) 1 MG tablet Take 1 tablet (1 mg total) by mouth  daily. 10/21/22  Yes Heilingoetter, Cassandra L, PA-C  levothyroxine (SYNTHROID) 100 MCG tablet Take 1 tablet (100 mcg total) by mouth daily at 6 (six) AM. 10/21/22 01/19/23 Yes Heilingoetter, Cassandra L, PA-C  losartan (COZAAR) 100 MG tablet Take 100 mg by mouth daily.   Yes [provider]  oxyCODONE (OXY IR/ROXICODONE) 5 MG immediate release tablet Take 1 tablet (5 mg total) by mouth every 3 (three) hours as needed for moderate pain ((score 4 to 6)). 09/27/22  Yes Barnetta Chapel, MD  prochlorperazine (COMPAZINE) 10 MG tablet Take 1 tablet (10 mg total) by mouth every 6 (six) hours as needed. 10/21/22  Yes Heilingoetter, Cassandra L, PA-C  senna (SENOKOT) 8.6 MG TABS tablet Take 1 tablet (8.6 mg total) by mouth 2 (two) times daily. 09/27/22  Yes Berton Mount I, MD  VITAMIN D PO Take 1 tablet by mouth daily.   Yes [provider]  lidocaine (XYLOCAINE) 2 % solution Patient: Mix 1part 2% viscous lidocaine, 1part H20. Swallow 10mL of diluted mixture, before meals and at bedtime, up to QID Patient not taking: Reported on 12/09/2022 10/21/22   Lonie Peak, MD     Family History  Problem Relation Age of Onset   Cancer Mother    Hypertension Father    Heart attack Father    Hypertension Sister    Hypertension Brother    Heart attack Brother    Cancer Daughter        thyroid   Diabetes Maternal Grandfather     Social History   Socioeconomic History  Marital status: Widowed    Spouse name: Not on file   Number of children: Not on file   Years of education: Not on file   Highest education level: Not on file  Occupational History   Not on file  Tobacco Use   Smoking status: Every Day    Packs/day: 1    Types: Cigarettes   Smokeless tobacco: Not on file  Substance and Sexual Activity   Alcohol use: No    Comment: rare   Drug use: No   Sexual activity: Never  Other Topics Concern   Not on file  Social History Narrative   Not on file   Social  Determinants of Health   Financial Resource Strain: Not on file  Food Insecurity: No Food Insecurity (09/21/2022)   Hunger Vital Sign    Worried About Running Out of Food in the Last Year: Never true    Ran Out of Food in the Last Year: Never true  Transportation Needs: No Transportation Needs (09/21/2022)   PRAPARE - Administrator, Civil Service (Medical): No    Lack of Transportation (Non-Medical): No  Physical Activity: Not on file  Stress: Not on file  Social Connections: Not on file    Code Status: Full Code  Review of Systems: A 12 point ROS discussed and pertinent positives are indicated in the HPI above.  All other systems are negative.  Review of Systems  Constitutional:  Negative for chills and fever.  Respiratory:  Negative for chest tightness and shortness of breath.   Cardiovascular:  Positive for leg swelling. Negative for chest pain.  Gastrointestinal:  Negative for abdominal pain, diarrhea, nausea and vomiting.  Neurological:  Negative for dizziness and headaches.  Psychiatric/Behavioral:  Negative for confusion.     Vital Signs: Ht 5\' 3"  (1.6 m)   Wt 148 lb 9.4 oz (67.4 kg)   BMI 26.32 kg/m    Physical Exam Vitals reviewed.  Constitutional:      General: She is not in acute distress.    Appearance: She is ill-appearing.  HENT:     Mouth/Throat:     Mouth: Mucous membranes are moist.  Eyes:     Pupils: Pupils are equal, round, and reactive to light.  Cardiovascular:     Rate and Rhythm: Normal rate and regular rhythm.     Pulses: Normal pulses.     Heart sounds: Normal heart sounds.  Pulmonary:     Effort: Pulmonary effort is normal.     Breath sounds: Normal breath sounds.  Abdominal:     Palpations: Abdomen is soft.     Tenderness: There is no abdominal tenderness.  Musculoskeletal:     Right lower leg: Edema present.     Left lower leg: Edema present.  Skin:    General: Skin is warm and dry.  Neurological:     Mental Status:  She is alert and oriented to person, place, and time.  Psychiatric:        Mood and Affect: Mood normal.        Behavior: Behavior normal.        Thought Content: Thought content normal.        Judgment: Judgment normal.     Imaging: No results found.  Labs:  CBC: Recent Labs    11/25/22 1320 12/02/22 1243 12/09/22 1030 12/16/22 1338  WBC 4.0 3.8* 4.7 3.3*  HGB 13.7 13.3 12.9 12.7  HCT 39.9 38.7 38.3 37.6  PLT 107* 156  186 84*    COAGS: Recent Labs    09/20/22 2201  INR 1.0    BMP: Recent Labs    11/25/22 1320 12/02/22 1243 12/09/22 1030 12/16/22 1338  NA 137 140 139 138  K 3.5 3.4* 3.4* 3.4*  CL 98 100 102 98  CO2 29 32 28 32  GLUCOSE 112* 115* 107* 100*  BUN 12 8 7* 11  CALCIUM 9.6 9.6 8.9 9.5  CREATININE 0.44 0.54 0.49 0.57  GFRNONAA >60 >60 >60 >60    LIVER FUNCTION TESTS: Recent Labs    11/25/22 1320 12/02/22 1243 12/09/22 1030 12/16/22 1338  BILITOT 0.5 0.4 0.4 0.4  AST 29 33 22 27  ALT 45* 51* 34 52*  ALKPHOS 115 118 111 113  PROT 6.5 6.5 6.9 6.9  ALBUMIN 3.9 3.9 3.8 3.9    TUMOR MARKERS: No results for input(s): "AFPTM", "CEA", "CA199", "CHROMGRNA" in the last 8760 hours.  Assessment and Plan:  Hayley [facility] is a 72 yo female with stage IV right lung non-small cell adenocarcinoma being seen today for image-guided port placement to facilitate chemotherapy. Patient has been referred to IR by Dr Arbutus Ped. She presents today in her usual state of health and is NPO. Case is scheduled to proceed with Dr Miles Costain on 12/18/22.  Risks and benefits of image guided port-a-catheter placement was discussed with the patient including, but not limited to bleeding, infection, pneumothorax, or fibrin sheath development and need for additional procedures.  All of the patient's questions were answered, patient is agreeable to proceed. Consent signed and in chart.   Thank you for this interesting consult.  I greatly enjoyed meeting Hayley Wu and  look forward to participating in their care.  A copy of this report was sent to the requesting provider on this date.  Electronically Signed: Kennieth Francois, PA-C 12/18/2022, 12:33 PM   I spent a total of  30 Minutes   in face to face in clinical consultation, greater than 50% of which was counseling/coordinating care for port placement.

## 2022-12-18 NOTE — Discharge Instructions (Signed)
Discharge Instructions:   Please call Interventional Radiology clinic [phone] with any questions or concerns.  You may remove your dressing and shower tomorrow.  Do not use EMLA / Lidocaine cream for 2 weeks post Port Insertion this will remove the surgical glue.  Do not ser merge under water.    Moderate Conscious Sedation, Adult, Care After This sheet gives you information about how to care for yourself after your procedure. Your health care provider may also give you more specific instructions. If you have problems or questions, contact your health care provider. What can I expect after the procedure? After the procedure, it is common to have: Sleepiness for several hours. Impaired judgment for several hours. Difficulty with balance. Vomiting if you eat too soon. Follow these instructions at home: For the time period you were told by your health care provider: Rest. Do not participate in activities where you could fall or become injured. Do not drive or use machinery. Do not drink alcohol. Do not take sleeping pills or medicines that cause drowsiness. Do not make important decisions or sign legal documents. Do not take care of children on your own. Eating and drinking  Follow the diet recommended by your health care provider. Drink enough fluid to keep your urine pale yellow. If you vomit: Drink water, juice, or soup when you can drink without vomiting. Make sure you have little or no nausea before eating solid foods. General instructions Take over-the-counter and prescription medicines only as told by your health care provider. Have a responsible adult stay with you for the time you are told. It is important to have someone help care for you until you are awake and alert. Do not smoke. Keep all follow-up visits as told by your health care provider. This is important. Contact a health care provider if: You are still sleepy or having trouble with balance after 24  hours. You feel light-headed. You keep feeling nauseous or you keep vomiting. You develop a rash. You have a fever. You have redness or swelling around the IV site. Get help right away if: You have trouble breathing. You have new-onset confusion at home. Summary After the procedure, it is common to feel sleepy, have impaired judgment, or feel nauseous if you eat too soon. Rest after you get home. Know the things you should not do after the procedure. Follow the diet recommended by your health care provider and drink enough fluid to keep your urine pale yellow. Get help right away if you have trouble breathing or new-onset confusion at home. This information is not intended to replace advice given to you by your health care provider. Make sure you discuss any questions you have with your health care provider. Document Revised: 09/24/2019 Document Reviewed: 04/22/2019 Elsevier Patient Education  2023 [employer] Inc[employer]   Implanted [place] Insertion, Care After The following information offers guidance on how to care for yourself after your procedure. Your health care provider may also give you more specific instructions. If you have problems or questions, contact your health care provider. What can I expect after the procedure? After the procedure, it is common to have: Discomfort at the port insertion site. Bruising on the skin over the port. This should improve over 3-4 days. Follow these instructions at home: [facility] care After your port is placed, you will get a manufacturer's information card. The card has information about your port. Keep this card with you at all times. Take care of the port as told by your health  care provider. Ask your health care provider if you or a family member can get training for taking care of the port at home. A home health care nurse will be be available to help care for the port. Make sure to remember what type of port you have. Incision care     Follow  instructions from your health care provider about how to take care of your port insertion site. Make sure you: Wash your hands with soap and water for at least 20 seconds before and after you change your bandage (dressing). If soap and water are not available, use hand sanitizer. Change your dressing as told by your health care provider. Leave stitches (sutures), skin glue, or adhesive strips in place. These skin closures may need to stay in place for 2 weeks or longer. If adhesive strip edges start to loosen and curl up, you may trim the loose edges. Do not remove adhesive strips completely unless your health care provider tells you to do that. Check your port insertion site every day for signs of infection. Check for: Redness, swelling, or pain. Fluid or blood. Warmth. Pus or a bad smell. Activity Return to your normal activities as told by your health care provider. Ask your health care provider what activities are safe for you. You may have to avoid lifting. Ask your health care provider how much you can safely lift. General instructions Take over-the-counter and prescription medicines only as told by your health care provider. Do not take baths, swim, or use a hot tub until your health care provider approves. Ask your health care provider if you may take showers. You may only be allowed to take sponge baths. If you were given a sedative during the procedure, it can affect you for several hours. Do not drive or operate machinery until your health care provider says that it is safe. Wear a medical alert bracelet in case of an emergency. This will tell any health care providers that you have a port. Keep all follow-up visits. This is important. Contact a health care provider if: You cannot flush your port with saline as directed, or you cannot draw blood from the port. You have a fever or chills. You have redness, swelling, or pain around your port insertion site. You have fluid or blood  coming from your port insertion site. Your port insertion site feels warm to the touch. You have pus or a bad smell coming from the port insertion site. Get help right away if: You have chest pain or shortness of breath. You have bleeding from your port that you cannot control. These symptoms may be an emergency. Get help right away. Call 911. Do not wait to see if the symptoms will go away. Do not drive yourself to the hospital. Summary Take care of the port as told by your health care provider. Keep the manufacturer's information card with you at all times. Change your dressing as told by your health care provider. Contact a health care provider if you have a fever or chills or if you have redness, swelling, or pain around your port insertion site. Keep all follow-up visits. This information is not intended to replace advice given to you by your health care provider. Make sure you discuss any questions you have with your health care provider. Document Revised: 11/28/2020 Document Reviewed: 11/28/2020 Elsevier Patient Education  2023 [employer].

## 2022-12-18 NOTE — Procedures (Signed)
Interventional Radiology Procedure Note  Procedure: rt internal jugular power port    Complications: None  Estimated Blood Loss:  min  Findings: Tip svcra    Sharen Counter, MD

## 2022-12-19 ENCOUNTER — Other Ambulatory Visit: Payer: Self-pay | Admitting: Internal Medicine

## 2022-12-19 ENCOUNTER — Encounter (HOSPITAL_COMMUNITY): Payer: Self-pay | Admitting: Radiology

## 2022-12-19 DIAGNOSIS — I878 Other specified disorders of veins: Secondary | ICD-10-CM

## 2022-12-20 ENCOUNTER — Encounter: Payer: Self-pay | Admitting: Internal Medicine

## 2022-12-22 ENCOUNTER — Other Ambulatory Visit: Payer: Self-pay

## 2022-12-22 ENCOUNTER — Ambulatory Visit (HOSPITAL_BASED_OUTPATIENT_CLINIC_OR_DEPARTMENT_OTHER)
Admission: RE | Admit: 2022-12-22 | Discharge: 2022-12-22 | Disposition: A | Discharge status: Home / Self Care | Payer: PPO | Source: Ambulatory Visit | Attending: Internal Medicine | Admitting: Internal Medicine

## 2022-12-22 DIAGNOSIS — C3490 Malignant neoplasm of unspecified part of unspecified bronchus or lung: Secondary | ICD-10-CM | POA: Diagnosis not present

## 2022-12-22 DIAGNOSIS — K8020 Calculus of gallbladder without cholecystitis without obstruction: Secondary | ICD-10-CM | POA: Diagnosis not present

## 2022-12-22 DIAGNOSIS — C7951 Secondary malignant neoplasm of bone: Secondary | ICD-10-CM | POA: Diagnosis not present

## 2022-12-22 DIAGNOSIS — C787 Secondary malignant neoplasm of liver and intrahepatic bile duct: Secondary | ICD-10-CM | POA: Diagnosis not present

## 2022-12-22 MED ORDER — IOHEXOL 300 MG/ML  SOLN
100.0000 mL | Freq: Once | INTRAMUSCULAR | Status: AC | PRN
  Administered 2022-12-22: 70 mL via INTRAVENOUS

## 2022-12-23 ENCOUNTER — Inpatient Hospital Stay: Payer: PPO

## 2022-12-23 DIAGNOSIS — R202 Paresthesia of skin: Secondary | ICD-10-CM | POA: Diagnosis not present

## 2022-12-23 DIAGNOSIS — K5903 Drug induced constipation: Secondary | ICD-10-CM | POA: Diagnosis not present

## 2022-12-23 DIAGNOSIS — R29898 Other symptoms and signs involving the musculoskeletal system: Secondary | ICD-10-CM | POA: Diagnosis not present

## 2022-12-23 DIAGNOSIS — Z5112 Encounter for antineoplastic immunotherapy: Secondary | ICD-10-CM | POA: Diagnosis not present

## 2022-12-23 DIAGNOSIS — C7951 Secondary malignant neoplasm of bone: Secondary | ICD-10-CM | POA: Diagnosis not present

## 2022-12-23 DIAGNOSIS — C3491 Malignant neoplasm of unspecified part of right bronchus or lung: Secondary | ICD-10-CM

## 2022-12-23 LAB — COMPREHENSIVE METABOLIC PANEL
ALT: 118 U/L — ABNORMAL HIGH (ref 0–44)
AST: 58 U/L — ABNORMAL HIGH (ref 15–41)
Albumin: 4.2 g/dL (ref 3.5–5.0)
Alkaline Phosphatase: 119 U/L (ref 38–126)
Anion gap: 7 (ref 5–15)
BUN: 9 mg/dL (ref 8–23)
CO2: 29 mmol/L (ref 22–32)
Calcium: 9.6 mg/dL (ref 8.9–10.3)
Chloride: 102 mmol/L (ref 98–111)
Creatinine, Ser: 0.55 mg/dL (ref 0.44–1.00)
GFR, Estimated: >60 mL/min (ref >60)
Glucose, Bld: 107 mg/dL — ABNORMAL HIGH (ref 70–99)
Potassium: 3.7 mmol/L (ref 3.5–5.1)
Sodium: 138 mmol/L (ref 135–145)
Total Bilirubin: 0.4 mg/dL (ref 0.3–1.2)
Total Protein: 6.8 g/dL (ref 6.5–8.1)

## 2022-12-23 LAB — CBC WITH DIFFERENTIAL/PLATELET
Abs Immature Granulocytes: 0.01 10*3/uL (ref 0.00–0.07)
Basophils Absolute: 0 10*3/uL (ref 0.0–0.1)
Basophils Relative: 1 %
Eosinophils Absolute: 0 10*3/uL (ref 0.0–0.5)
Eosinophils Relative: 1 %
HCT: 36 % (ref 36.0–46.0)
Hemoglobin: 12.4 g/dL (ref 12.0–15.0)
Immature Granulocytes: 0 %
Lymphocytes Relative: 13 %
Lymphs Abs: 0.5 10*3/uL — ABNORMAL LOW (ref 0.7–4.0)
MCH: 31.6 pg (ref 26.0–34.0)
MCHC: 34.4 g/dL (ref 30.0–36.0)
MCV: 91.6 fL (ref 80.0–100.0)
Monocytes Absolute: 0.4 10*3/uL (ref 0.1–1.0)
Monocytes Relative: 11 %
Neutro Abs: 2.8 10*3/uL (ref 1.7–7.7)
Neutrophils Relative %: 74 %
Platelets: 165 10*3/uL (ref 150–400)
RBC: 3.93 MIL/uL (ref 3.87–5.11)
RDW: 14.9 % (ref 11.5–15.5)
WBC: 3.8 10*3/uL — ABNORMAL LOW (ref 4.0–10.5)
nRBC: 0 % (ref 0.0–0.2)

## 2022-12-24 ENCOUNTER — Other Ambulatory Visit: Payer: Self-pay | Admitting: Internal Medicine

## 2022-12-24 DIAGNOSIS — C3491 Malignant neoplasm of unspecified part of right bronchus or lung: Secondary | ICD-10-CM

## 2022-12-25 NOTE — Progress Notes (Signed)
[facility] OFFICE PROGRESS NOTE  Hayley Hatch, PA-C [address] [place] [address]  DIAGNOSIS: Stage IV (T1b, N2, M1 C) non-small cell lung cancer, adenocarcinoma presented with right lower lobe lung nodule in addition to small bilateral pulmonary nodules and right hilar and mediastinal lymphadenopathy in addition to liver and bone metastasis diagnosed in April 2024. 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   PDL1: 20%   Guardant 360: no actionable mutations.   PRIOR THERAPY: 1) Decompressive thoracic laminectomy of the T7 with spinal cord decompression and biopsy of the epidural mass in April 2024.  2) Palliative radiation to the bone under the care of Dr. Basilio Cairo. Last day scheduled for 10/25/22   CURRENT THERAPY: Carboplatin for an AUC of 5, Alimta 500 mg/m, Keytruda 200 mg IV every 3 weeks.  First dose expected on 10/28/2022. Status post 3 cycle.   INTERVAL HISTORY: Hayley Wu 72 y.o. female returns to the clinic today for a follow-up visit accompanied by her daughter. She is currently undergoing palliative systemic chemotherapy and immunotherapy.  She is status post her 3rd cycle of treatment 3 weeks ago which she tolerated well.  He sometimes has queasiness and fatigue a few days following treatment.  She is going to start taking her antiemetic First thing in the morning a few days following chemotherapy when she typically has queasiness to try and prevent her symptoms.  Of note, she is prescribed pain medication for her back pain if needed. She does not take this unless absolutely needed. She mostly takes tylenol.  Today she denies any fever, chills, or night sweats. Her weight is stable. She mentions she sometimes feels anxious and sad with her diagnosis.   Denies any chest pain, shortness of breath, cough, or hemoptysis.  She completed PT for the  weakness in her legs. She is making progress and is able to stand on the scan which she could not do previously. Her PTP referred her for neuroPT soon.  Denies any headache or visual changes.  Denies any vomiting or diarrhea.  She sometimes has constipation and takes senna.  No headaches or vision changes. No rashes or skin changes. She recently had a restaging CT scan performed.  She is here today for evaluation and to review her scan before undergoing cycle #4.     MEDICAL HISTORY: Past Medical History:  Diagnosis Date   Allergy    Contact lens/glasses fitting    Hypertension     ALLERGIES:  is allergic to erythromycin, amoxicillin, latex, and prednisone.  MEDICATIONS:  Current Outpatient Medications  Medication Sig Dispense Refill   lidocaine-prilocaine (EMLA) cream Apply 1 Application topically as needed. 30 g 2   acetaminophen (TYLENOL) 500 MG tablet Take 500 mg by mouth every 6 (six) hours as needed for moderate pain.     amLODipine (NORVASC) 10 MG tablet Take 10 mg by mouth daily.     folic acid (FOLVITE) 1 MG tablet Take 1 tablet (1 mg total) by mouth daily. 30 tablet 2   levothyroxine (SYNTHROID) 100 MCG tablet Take 1 tablet (100 mcg total) by mouth daily at 6 (six) AM. 30 tablet 2   lidocaine (XYLOCAINE) 2 % solution Patient: Mix 1part 2% viscous lidocaine, 1part H20. Swallow 10mL of diluted mixture, before meals and at bedtime, up to QID (Patient not taking: Reported on 12/09/2022) 200 mL 2  losartan (COZAAR) 100 MG tablet Take 100 mg by mouth daily.     oxyCODONE (OXY IR/ROXICODONE) 5 MG immediate release tablet Take 1 tablet (5 mg total) by mouth every 3 (three) hours as needed for moderate pain ((score 4 to 6)). 30 tablet 0   prochlorperazine (COMPAZINE) 10 MG tablet Take 1 tablet (10 mg total) by mouth every 6 (six) hours as needed. 30 tablet 2   senna (SENOKOT) 8.6 MG TABS tablet Take 1 tablet (8.6 mg total) by mouth 2 (two) times daily. 120 tablet 0   VITAMIN D PO  Take 1 tablet by mouth daily.     No current facility-administered medications for this visit.    SURGICAL HISTORY:  Past Surgical History:  Procedure Laterality Date   ABDOMINAL HYSTERECTOMY     BREAST SURGERY  2010   rt lumpectomy-neg   IR IMAGING GUIDED PORT INSERTION  12/18/2022   LAMINECTOMY N/A 09/21/2022   Procedure: THORACIC LAMINECTOMY FOR TUMOR;  Surgeon: Tia Alert, MD;  Location: [facility] OR;  Service: Neurosurgery;  Laterality: N/A;   OPEN REDUCTION INTERNAL FIXATION (ORIF) DISTAL RADIAL FRACTURE Left 11/26/2013   Procedure: OPEN REDUCTION INTERNAL FIXATION (ORIF) LEFT  DISTAL RADIUS ;  Surgeon: Nestor Lewandowsky, MD;  Location: [facility];  Service: Orthopedics;  Laterality: Left;  with block   TUBAL LIGATION      REVIEW OF SYSTEMS:   Constitutional: Positive for fatigue following treatment.  Negative for appetite change, chills, fever and unexpected weight change.  HENT: Negative for mouth sores, nosebleeds, sore throat and trouble swallowing.   Eyes: Negative for eye problems and icterus.  Respiratory: Negative for cough, hemoptysis, shortness of breath and wheezing.   Cardiovascular: Negative for chest pain.  Positive for stable baseline lower extremity swelling. Gastrointestinal: Positive for occasional queasiness and constipation following treatment.  Negative for abdominal pain, diarrhea, and vomiting.  Genitourinary: Negative for bladder incontinence, difficulty urinating, dysuria, frequency and hematuria.   Musculoskeletal: Positive for back pain. Negative for gait problem, neck pain and neck stiffness.  Skin: Negative for itching and rash.  Neurological: Positive for weakness in the legs bilaterally. Negative for dizziness, gait problem, headaches, light-headedness and seizures.  Hematological: Negative for adenopathy. Does not bruise/bleed easily.  Psychiatric/Behavioral: Negative for confusion, depression and sleep disturbance. The patient is not  nervous/anxious.     PHYSICAL EXAMINATION:  Blood pressure 111/72, pulse 100, temperature 97.7 F (36.5 C), resp. rate 18, weight 148 lb 4.8 oz (67.3 kg), SpO2 100%.  ECOG PERFORMANCE STATUS: 2  Physical Exam  Constitutional: Oriented to person, place, and time and well-developed, well-nourished, and in no distress. She seemed uncomfortable due to her back pain and took a oxycodone while in the clinic.  HENT:  Head: Normocephalic and atraumatic.  Mouth/Throat: Oropharynx is clear and moist. No oropharyngeal exudate.  Eyes: Conjunctivae are normal. Right eye exhibits no discharge. Left eye exhibits no discharge. No scleral icterus.  Neck: Normal range of motion. Neck supple.  Cardiovascular: Normal rate, regular rhythm, normal heart sounds and intact distal pulses.   Pulmonary/Chest: Effort normal and breath sounds normal. No respiratory distress. No wheezes. No rales.  Abdominal: Soft. Bowel sounds are normal. Exhibits no distension and no mass. There is no tenderness.  Musculoskeletal: Normal range of motion.  Positive for stable bilateral ankle swelling. Lymphadenopathy:    No cervical adenopathy.  Neurological: Alert and oriented to person, place, and time.  Exhibits muscle wasting. She was examined in the wheelchair.  Skin:  Skin is warm and dry. No rash noted. Not diaphoretic. No erythema. No pallor.  Psychiatric: Mood, memory and judgment normal.  Vitals reviewed.  LABORATORY DATA: Lab Results  Component Value Date   WBC 3.8 (L) 12/30/2022   HGB 12.5 12/30/2022   HCT 37.5 12/30/2022   MCV 92.1 12/30/2022   PLT 182 12/30/2022      Chemistry      Component Value Date/Time   NA 138 12/30/2022 1027   K 3.3 (L) 12/30/2022 1027   CL 103 12/30/2022 1027   CO2 28 12/30/2022 1027   BUN 10 12/30/2022 1027   CREATININE 0.45 12/30/2022 1027      Component Value Date/Time   CALCIUM 9.3 12/30/2022 1027   ALKPHOS 108 12/30/2022 1027   AST 16 12/30/2022 1027   ALT 31 12/30/2022  1027   BILITOT 0.5 12/30/2022 1027       RADIOGRAPHIC STUDIES:  CT CHEST ABDOMEN PELVIS W CONTRAST  Result Date: 12/22/2022 CLINICAL DATA:  Lung cancer restaging, status post radiation therapy, ongoing chemotherapy * Tracking Code: BO * EXAM: CT CHEST, ABDOMEN, AND PELVIS WITH CONTRAST TECHNIQUE: Multidetector CT imaging of the chest, abdomen and pelvis was performed following the standard protocol during bolus administration of intravenous contrast. RADIATION DOSE REDUCTION: This exam was performed according to the departmental dose-optimization program which includes automated exposure control, adjustment of the mA and/or kV according to patient size and/or use of iterative reconstruction technique. CONTRAST:  70mL OMNIPAQUE IOHEXOL 300 MG/ML  SOLN COMPARISON:  PET-CT, 10/17/2022, CT chest abdomen pelvis, 09/20/2022 FINDINGS: CT CHEST FINDINGS Cardiovascular: Right chest port catheter. Aortic atherosclerosis. Normal heart size. Left and right coronary artery calcifications. No pericardial effusion. Mediastinum/Nodes: Diminished size of previously seen enlarged FDG avid right hilar and pretracheal lymph nodes, subcarinal node measuring 1.1 x 0.9 cm, previously 1.4 x 1.3 cm (series 2, image 24). Thyroid gland, trachea, and esophagus demonstrate no significant findings. Lungs/Pleura: Moderate centrilobular and paraseptal emphysema. Diffuse bilateral bronchial wall thickening. Diminished size and fullness of a spiculated nodule of the peripheral right lower lobe, measuring 2.9 x 1.3 cm, previously 3.1 x 1.6 cm when measured similarly (series 4, image 77). Unchanged spiculated nodule of the right upper lobe measuring 0.8 x 0.6 cm (series 4, image 31). Additional tiny bilateral pulmonary nodules are unchanged or slightly diminished, for example near complete resolution of a tiny nodule of the right apex measuring 0.2 cm, previously 0.4 cm (series 4, image 21) unchanged irregular nodule of the peripheral left  upper lobe measuring 0.6 cm (series 4, image 24). No pleural effusion or pneumothorax. Musculoskeletal: No chest wall abnormality. No acute osseous findings. CT ABDOMEN PELVIS FINDINGS Hepatobiliary: Interval decrease in size of multiple hypodense, previously FDG avid hepatic metastases, largest in the right lobe of the liver, hepatic segment V measuring 1.5 x 1.1 cm, previously 2.2 x 2.1 cm (series 2, image 58). Background of multiple additional simple fluid attenuation cysts, benign, requiring no specific further follow-up or characterization rim calcified gallstone. No gallbladder wall thickening, or biliary dilatation. Pancreas: Unremarkable. No pancreatic ductal dilatation or surrounding inflammatory changes. Spleen: Normal in size without significant abnormality. Adrenals/Urinary Tract: Adrenal glands are unremarkable. Punctuate nonobstructive calculus of the inferior pole of the left kidney. No right-sided calculi, ureteral calculi, or hydronephrosis. Bladder is unremarkable. Stomach/Bowel: Stomach is within normal limits. Appendix appears normal. No evidence of bowel wall thickening, distention, or inflammatory changes. Sigmoid diverticulosis. Vascular/Lymphatic: Aortic atherosclerosis. No enlarged abdominal or pelvic lymph nodes. Reproductive: Status post hysterectomy.  Other: No abdominal wall hernia or abnormality. No ascites. Musculoskeletal: No acute osseous findings. Interval increase in sclerosis of multiple osseous metastases of the vertebral bodies, most notably a high-grade, vertebral plana wedge deformity of T7 (series 6, image 59). IMPRESSION: 1. Diminished size and fullness of a spiculated nodule of the peripheral right lower lobe, consistent with treatment response. 2. Unchanged spiculated nodule of the right upper lobe measuring 0.8 x 0.6 cm. Other small nodules slightly improved or unchanged. 3. Diminished size of previously seen enlarged FDG avid right hilar and pretracheal lymph nodes. 4.  Interval improvement of multiple hepatic metastases. 5. Interval post treatment sclerosis of multiple osseous metastases of the vertebral bodies, most notably a high-grade, vertebral plana wedge deformity of T7. 6. Constellation of findings is consistent with treatment response of primary lung malignancy and metastatic disease. 7. Emphysema and diffuse bilateral bronchial wall thickening. 8. Cholelithiasis. 9. Punctuate nonobstructive left nephrolithiasis. 10. Coronary artery disease. Aortic Atherosclerosis (ICD10-I70.0) and Emphysema (ICD10-J43.9). Electronically Signed   By: Jearld Lesch M.D.   On: 12/22/2022 15:10   IR IMAGING GUIDED PORT INSERTION  Result Date: 12/18/2022 CLINICAL DATA:  Metastatic lung cancer EXAM: RIGHT INTERNAL JUGULAR SINGLE LUMEN POWER PORT CATHETER INSERTION Date:  12/18/2022 12/18/2022 2:10 pm Radiologist:  M. Ruel Favors, MD Guidance:  Ultrasound and fluoroscopic MEDICATIONS: 1% lidocaine local with epinephrine; The antibiotic was administered within an appropriate time interval prior to skin puncture. ANESTHESIA/SEDATION: Versed 2.0 mg IV; Fentanyl 100 mcg IV; Moderate Sedation Time:  26 minutes The patient was continuously monitored during the procedure by the interventional radiology nurse under my direct supervision. FLUOROSCOPY: 0 minutes, 48 seconds (3 mGy) COMPLICATIONS: None immediate. CONTRAST:  None. PROCEDURE: Informed consent was obtained from the patient following explanation of the procedure, risks, benefits and alternatives. The patient understands, agrees and consents for the procedure. All questions were addressed. A time out was performed. Maximal barrier sterile technique utilized including caps, mask, sterile gowns, sterile gloves, large sterile drape, hand hygiene, and 2% chlorhexidine scrub. Under sterile conditions and local anesthesia, right internal jugular micropuncture venous access was performed. Access was performed with ultrasound. Images were obtained  for documentation of the patent right internal jugular vein. A guide wire was inserted followed by a transitional dilator. This allowed insertion of a guide wire and catheter into the IVC. Measurements were obtained from the SVC / RA junction back to the right IJ venotomy site. In the right infraclavicular chest, a subcutaneous pocket was created over the second anterior rib. This was done under sterile conditions and local anesthesia. 1% lidocaine with epinephrine was utilized for this. A 2.5 cm incision was made in the skin. Blunt dissection was performed to create a subcutaneous pocket over the right pectoralis major muscle. The pocket was flushed with saline vigorously. There was adequate hemostasis. The port catheter was assembled and checked for leakage. The port catheter was secured in the pocket with two retention sutures. The tubing was tunneled subcutaneously to the right venotomy site and inserted into the SVC/RA junction through a valved peel-away sheath. Position was confirmed with fluoroscopy. Images were obtained for documentation. The patient tolerated the procedure well. No immediate complications. Incisions were closed in a two layer fashion with 4 - 0 Vicryl suture. Dermabond was applied to the skin. The port catheter was accessed, blood was aspirated followed by saline and heparin flushes. Needle was removed. A dry sterile dressing was applied. IMPRESSION: Ultrasound and fluoroscopically guided right internal jugular single lumen power port catheter  insertion. Tip in the SVC/RA junction. Catheter ready for use. Electronically Signed   By: Judie Petit.  Shick M.D.   On: 12/18/2022 14:41     ASSESSMENT/PLAN:  This is a very pleasant 72 year old Caucasian female recently diagnosed with stage IV (T1b, N2, M1 C) non-small cell lung cancer, adenocarcinoma presented with right lower lobe lung nodule in addition to small bilateral pulmonary nodules and right hilar and mediastinal lymphadenopathy in addition to  liver and bone metastasis diagnosed in April 2024. 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 Her PDL1 expression is 20%. She has no actionable mutations.    She is completed palliative radiation to the painful metastatic bone lesions under the care of Dr. Basilio Cairo in the last day radiation was on 10/25/2022.    She is currently undergoing systemic chemotherapy with carboplatin for an AUC of 5, Alimta 500 mg/m, and Keytruda 200 mg IV every 3 weeks. She is status post 3 cycles and tolerated it well.   The patient was seen with Dr. Arbutus Ped today.  Dr. Arbutus Ped personally and independently reviewed the scan and discussed results with the patient today.  The scan showed a positive response to treatment.  Dr. Arbutus Ped recommends she continue with cycle #4 as scheduled.   Labs were reviewed. Recommend that she proceed with cycle #4 today as scheduled.  He will start maintenance treatment at her next appointment and she will no longer need weekly labs after cycle #5.   I sent a prescription for Emla cream to her pharmacy.  We will also change her lab appointments to lab and flush.   We will see her back for follow-up visit in 3 weeks for evaluation and repeat blood work before undergoing cycle #4.   I discussed the lung cancer support group with the patient and her daughter.  She will let me know if she is interested.  If she was started on any medications for anxiety or depression by her PCP, discussed that it is okay from our standpoint and there should not be any drug to drug interactions.  The patient was advised to call immediately if she has any concerning symptoms in the interval. The patient voices understanding of current disease status and treatment options and is in agreement with the current care plan. All questions were answered. The patient knows to call the clinic with any problems, questions or  concerns. We can certainly see the patient much sooner if necessary   No orders of the defined types were placed in this encounter.    Hayley Chastang L Vanshika Jastrzebski, PA-C 12/30/22  ADDENDUM: Hematology/Oncology Attending: I had a face-to-face encounter with the patient today.  I reviewed her records, lab, scan and recommended her care plan.  This is a very pleasant 72 years old white female with stage IV non-small cell lung cancer, adenocarcinoma with no actionable mutations and PD-L1 expression of 20% diagnosed in April 2024 status post decompressive thoracic laminectomy followed by palliative radiotherapy to the bone metastasis.  The patient is currently undergoing systemic chemotherapy with carboplatin, Alimta and Keytruda status post 3 cycles and has been tolerating her treatment fairly well. She had repeat CT scan of the chest, abdomen and pelvis performed recently.  I personally and independently reviewed the scan images and discussed the results with the patient and her daughter. Her scan showed improvement of her disease with no concerning findings of progression. I recommended for  the patient to continue her current treatment with the same regimen and she will proceed with cycle #4 today. The patient will come back for follow-up visit in 3 weeks for evaluation before the next cycle of her maintenance therapy. She was advised to call immediately if she has any other concerning symptoms in the interval. The total time spent in the appointment was 30 minutes.  Disclaimer: This note was dictated with voice recognition software. Similar sounding words can inadvertently be transcribed and may be missed upon review. Lajuana Matte, MD

## 2022-12-26 DIAGNOSIS — H2513 Age-related nuclear cataract, bilateral: Secondary | ICD-10-CM | POA: Diagnosis not present

## 2022-12-27 MED FILL — Dexamethasone Sodium Phosphate Inj 100 MG/10ML: INTRAMUSCULAR | Qty: 1 | Status: AC

## 2022-12-27 MED FILL — Fosaprepitant Dimeglumine For IV Infusion 150 MG (Base Eq): INTRAVENOUS | Qty: 5 | Status: AC

## 2022-12-30 ENCOUNTER — Inpatient Hospital Stay: Payer: PPO | Admitting: Physician Assistant

## 2022-12-30 ENCOUNTER — Other Ambulatory Visit: Payer: Self-pay | Admitting: Physician Assistant

## 2022-12-30 ENCOUNTER — Inpatient Hospital Stay: Payer: PPO

## 2022-12-30 ENCOUNTER — Encounter: Payer: Self-pay | Admitting: Internal Medicine

## 2022-12-30 ENCOUNTER — Other Ambulatory Visit: Payer: Self-pay

## 2022-12-30 VITALS — BP 106/65 | HR 84 | Temp 98.3°F | Resp 16

## 2022-12-30 DIAGNOSIS — C3491 Malignant neoplasm of unspecified part of right bronchus or lung: Secondary | ICD-10-CM

## 2022-12-30 DIAGNOSIS — C7951 Secondary malignant neoplasm of bone: Secondary | ICD-10-CM

## 2022-12-30 DIAGNOSIS — Z5112 Encounter for antineoplastic immunotherapy: Secondary | ICD-10-CM | POA: Diagnosis not present

## 2022-12-30 DIAGNOSIS — Z95828 Presence of other vascular implants and grafts: Secondary | ICD-10-CM

## 2022-12-30 LAB — CMP (CANCER CENTER ONLY)
ALT: 31 U/L (ref 0–44)
AST: 16 U/L (ref 15–41)
Albumin: 4 g/dL (ref 3.5–5.0)
Alkaline Phosphatase: 108 U/L (ref 38–126)
Anion gap: 7 (ref 5–15)
BUN: 10 mg/dL (ref 8–23)
CO2: 28 mmol/L (ref 22–32)
Calcium: 9.3 mg/dL (ref 8.9–10.3)
Chloride: 103 mmol/L (ref 98–111)
Creatinine: 0.45 mg/dL (ref 0.44–1.00)
GFR, Estimated: >60 mL/min (ref >60)
Glucose, Bld: 131 mg/dL — ABNORMAL HIGH (ref 70–99)
Potassium: 3.3 mmol/L — ABNORMAL LOW (ref 3.5–5.1)
Sodium: 138 mmol/L (ref 135–145)
Total Bilirubin: 0.5 mg/dL (ref 0.3–1.2)
Total Protein: 6.2 g/dL — ABNORMAL LOW (ref 6.5–8.1)

## 2022-12-30 LAB — CBC WITH DIFFERENTIAL (CANCER CENTER ONLY)
Abs Immature Granulocytes: 0.01 10*3/uL (ref 0.00–0.07)
Basophils Absolute: 0 10*3/uL (ref 0.0–0.1)
Basophils Relative: 1 %
Eosinophils Absolute: 0.1 10*3/uL (ref 0.0–0.5)
Eosinophils Relative: 1 %
HCT: 37.5 % (ref 36.0–46.0)
Hemoglobin: 12.5 g/dL (ref 12.0–15.0)
Immature Granulocytes: 0 %
Lymphocytes Relative: 11 %
Lymphs Abs: 0.4 10*3/uL — ABNORMAL LOW (ref 0.7–4.0)
MCH: 30.7 pg (ref 26.0–34.0)
MCHC: 33.3 g/dL (ref 30.0–36.0)
MCV: 92.1 fL (ref 80.0–100.0)
Monocytes Absolute: 0.5 10*3/uL (ref 0.1–1.0)
Monocytes Relative: 13 %
Neutro Abs: 2.8 10*3/uL (ref 1.7–7.7)
Neutrophils Relative %: 74 %
Platelet Count: 182 10*3/uL (ref 150–400)
RBC: 4.07 MIL/uL (ref 3.87–5.11)
RDW: 15.5 % (ref 11.5–15.5)
WBC Count: 3.8 10*3/uL — ABNORMAL LOW (ref 4.0–10.5)
nRBC: 0 % (ref 0.0–0.2)

## 2022-12-30 MED ORDER — HEPARIN SOD (PORK) LOCK FLUSH 100 UNIT/ML IV SOLN
500.0000 [IU] | Freq: Once | INTRAVENOUS | Status: AC | PRN
  Administered 2022-12-30: 500 [IU]

## 2022-12-30 MED ORDER — SODIUM CHLORIDE 0.9 % IV SOLN
500.0000 mg/m2 | Freq: Once | INTRAVENOUS | Status: AC
  Administered 2022-12-30: 900 mg via INTRAVENOUS
  Filled 2022-12-30: qty 20

## 2022-12-30 MED ORDER — SODIUM CHLORIDE 0.9 % IV SOLN
430.5000 mg | Freq: Once | INTRAVENOUS | Status: AC
  Administered 2022-12-30: 430 mg via INTRAVENOUS
  Filled 2022-12-30: qty 43

## 2022-12-30 MED ORDER — SODIUM CHLORIDE 0.9% FLUSH
10.0000 mL | INTRAVENOUS | Status: DC | PRN
  Administered 2022-12-30: 10 mL

## 2022-12-30 MED ORDER — SODIUM CHLORIDE 0.9 % IV SOLN
150.0000 mg | Freq: Once | INTRAVENOUS | Status: AC
  Administered 2022-12-30: 150 mg via INTRAVENOUS
  Filled 2022-12-30: qty 150

## 2022-12-30 MED ORDER — SODIUM CHLORIDE 0.9 % IV SOLN
10.0000 mg | Freq: Once | INTRAVENOUS | Status: AC
  Administered 2022-12-30: 10 mg via INTRAVENOUS
  Filled 2022-12-30: qty 10

## 2022-12-30 MED ORDER — LIDOCAINE-PRILOCAINE 2.5-2.5 % EX CREA
1.0000 | TOPICAL_CREAM | CUTANEOUS | 2 refills | Status: AC | PRN
Start: 2022-12-30 — End: ?

## 2022-12-30 MED ORDER — SODIUM CHLORIDE 0.9 % IV SOLN
200.0000 mg | Freq: Once | INTRAVENOUS | Status: AC
  Administered 2022-12-30: 200 mg via INTRAVENOUS
  Filled 2022-12-30: qty 200

## 2022-12-30 MED ORDER — PALONOSETRON HCL INJECTION 0.25 MG/5ML
0.2500 mg | Freq: Once | INTRAVENOUS | Status: AC
  Administered 2022-12-30: 0.25 mg via INTRAVENOUS
  Filled 2022-12-30: qty 5

## 2022-12-30 MED ORDER — SODIUM CHLORIDE 0.9 % IV SOLN: Freq: Once | INTRAVENOUS | Status: AC

## 2022-12-30 MED ORDER — SODIUM CHLORIDE 0.9% FLUSH
10.0000 mL | Freq: Once | INTRAVENOUS | Status: AC
  Administered 2022-12-30: 10 mL

## 2022-12-30 NOTE — Progress Notes (Signed)
Glucose 319, Dr. Mosetta Putt notified and new order received. Medicated with Novolog 8 units and rechecked after she ate lunch CBG 279.

## 2022-12-31 ENCOUNTER — Ambulatory Visit: Payer: PPO | Admitting: Physical Therapy

## 2023-01-02 ENCOUNTER — Telehealth: Payer: Self-pay

## 2023-01-02 NOTE — Telephone Encounter (Signed)
TC from pt, stating that she wanted to pick up the Rx for EMLA cream that was prescribed on 12/30/22, but pharmacy told her she needed a pre-authorization from insurance company. Routed note to [place].

## 2023-01-02 NOTE — Telephone Encounter (Signed)
I will take care of it.

## 2023-01-03 ENCOUNTER — Telehealth: Payer: Self-pay

## 2023-01-03 NOTE — Telephone Encounter (Signed)
Notified Patient of prior authorization approval for Lidocaine-Prilocaine 2.5% Cream (EMLA). Medication is approved through 04/02/2023. No other needs or concerns voiced at this time.

## 2023-01-05 ENCOUNTER — Other Ambulatory Visit: Payer: Self-pay

## 2023-01-06 ENCOUNTER — Other Ambulatory Visit: Payer: PPO

## 2023-01-06 ENCOUNTER — Inpatient Hospital Stay: Payer: PPO

## 2023-01-06 ENCOUNTER — Other Ambulatory Visit: Payer: Self-pay

## 2023-01-06 DIAGNOSIS — Z95828 Presence of other vascular implants and grafts: Secondary | ICD-10-CM

## 2023-01-06 DIAGNOSIS — C3491 Malignant neoplasm of unspecified part of right bronchus or lung: Secondary | ICD-10-CM

## 2023-01-06 DIAGNOSIS — Z5112 Encounter for antineoplastic immunotherapy: Secondary | ICD-10-CM | POA: Diagnosis not present

## 2023-01-06 DIAGNOSIS — C7951 Secondary malignant neoplasm of bone: Secondary | ICD-10-CM

## 2023-01-06 LAB — COMPREHENSIVE METABOLIC PANEL
ALT: 48 U/L — ABNORMAL HIGH (ref 0–44)
AST: 27 U/L (ref 15–41)
Albumin: 4.2 g/dL (ref 3.5–5.0)
Alkaline Phosphatase: 103 U/L (ref 38–126)
Anion gap: 9 (ref 5–15)
BUN: 11 mg/dL (ref 8–23)
CO2: 28 mmol/L (ref 22–32)
Calcium: 9.5 mg/dL (ref 8.9–10.3)
Chloride: 101 mmol/L (ref 98–111)
Creatinine, Ser: 0.39 mg/dL — ABNORMAL LOW (ref 0.44–1.00)
GFR, Estimated: >60 mL/min (ref >60)
Glucose, Bld: 93 mg/dL (ref 70–99)
Potassium: 3.3 mmol/L — ABNORMAL LOW (ref 3.5–5.1)
Sodium: 138 mmol/L (ref 135–145)
Total Bilirubin: 0.6 mg/dL (ref 0.3–1.2)
Total Protein: 6.4 g/dL — ABNORMAL LOW (ref 6.5–8.1)

## 2023-01-06 LAB — CBC WITH DIFFERENTIAL/PLATELET
Abs Immature Granulocytes: 0.01 10*3/uL (ref 0.00–0.07)
Basophils Absolute: 0 10*3/uL (ref 0.0–0.1)
Basophils Relative: 1 %
Eosinophils Absolute: 0.1 10*3/uL (ref 0.0–0.5)
Eosinophils Relative: 3 %
HCT: 35.5 % — ABNORMAL LOW (ref 36.0–46.0)
Hemoglobin: 12 g/dL (ref 12.0–15.0)
Immature Granulocytes: 0 %
Lymphocytes Relative: 20 %
Lymphs Abs: 0.5 10*3/uL — ABNORMAL LOW (ref 0.7–4.0)
MCH: 30.6 pg (ref 26.0–34.0)
MCHC: 33.8 g/dL (ref 30.0–36.0)
MCV: 90.6 fL (ref 80.0–100.0)
Monocytes Absolute: 0.3 10*3/uL (ref 0.1–1.0)
Monocytes Relative: 11 %
Neutro Abs: 1.5 10*3/uL — ABNORMAL LOW (ref 1.7–7.7)
Neutrophils Relative %: 65 %
Platelets: 101 10*3/uL — ABNORMAL LOW (ref 150–400)
RBC: 3.92 MIL/uL (ref 3.87–5.11)
RDW: 14.5 % (ref 11.5–15.5)
WBC: 2.3 10*3/uL — ABNORMAL LOW (ref 4.0–10.5)
nRBC: 0 % (ref 0.0–0.2)

## 2023-01-06 MED ORDER — SODIUM CHLORIDE 0.9% FLUSH
10.0000 mL | Freq: Once | INTRAVENOUS | Status: AC
  Administered 2023-01-06: 10 mL

## 2023-01-06 MED ORDER — HEPARIN SOD (PORK) LOCK FLUSH 100 UNIT/ML IV SOLN
500.0000 [IU] | Freq: Once | INTRAVENOUS | Status: AC
  Administered 2023-01-06: 500 [IU]

## 2023-01-07 ENCOUNTER — Ambulatory Visit: Payer: PPO | Attending: Family Medicine

## 2023-01-07 DIAGNOSIS — R262 Difficulty in walking, not elsewhere classified: Secondary | ICD-10-CM | POA: Diagnosis not present

## 2023-01-07 DIAGNOSIS — R29898 Other symptoms and signs involving the musculoskeletal system: Secondary | ICD-10-CM

## 2023-01-07 DIAGNOSIS — R2689 Other abnormalities of gait and mobility: Secondary | ICD-10-CM | POA: Diagnosis not present

## 2023-01-07 DIAGNOSIS — M6281 Muscle weakness (generalized): Secondary | ICD-10-CM

## 2023-01-07 DIAGNOSIS — R2681 Unsteadiness on feet: Secondary | ICD-10-CM

## 2023-01-07 NOTE — Therapy (Unsigned)
OUTPATIENT PHYSICAL THERAPY NEURO EVALUATION   Patient Name: Hayley Wu MRN: [id number] DOB:09/19/1950, 72 y.o., female Today's Date: 01/07/2023   PCP: [name] REFERRING PROVIDER: [name], PA-C  END OF SESSION:  PT End of Session - 01/07/23 1306     Visit Number 1    Number of Visits 8    Date for PT Re-Evaluation 02/04/23    Authorization Type [employer]    PT Start Time 1315    PT Stop Time 1400    PT Time Calculation (min) 45 min             Past Medical History:  Diagnosis Date   Allergy    Contact lens/glasses fitting    Hypertension    Past Surgical History:  Procedure Laterality Date   ABDOMINAL HYSTERECTOMY     BREAST SURGERY  2010   rt lumpectomy-neg   IR IMAGING GUIDED PORT INSERTION  12/18/2022   LAMINECTOMY N/A 09/21/2022   Procedure: THORACIC LAMINECTOMY FOR TUMOR;  Surgeon: Tia Alert, MD;  Location: [facility] OR;  Service: Neurosurgery;  Laterality: N/A;   OPEN REDUCTION INTERNAL FIXATION (ORIF) DISTAL RADIAL FRACTURE Left 11/26/2013   Procedure: OPEN REDUCTION INTERNAL FIXATION (ORIF) LEFT  DISTAL RADIUS ;  Surgeon: Nestor Lewandowsky, MD;  Location: [facility];  Service: Orthopedics;  Laterality: Left;  with block   TUBAL LIGATION     Patient Active Problem List   Diagnosis Date Noted   Port-A-Cath in place 12/30/2022   Encounter for antineoplastic immunotherapy 11/18/2022   Encounter for antineoplastic chemotherapy 11/18/2022   Constipation 11/05/2022   Goals of care, counseling/discussion 10/21/2022   Cancer, metastatic to bone (HCC) 10/04/2022   Adenocarcinoma of right lung, stage 4 (HCC) 10/01/2022   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 09/20/2022   Hypokalemia 09/20/2022   Lung mass 09/20/2022   Hypothyroidism 09/20/2022   Cholelithiasis 09/20/2022    Hypertension 09/23/2011    ONSET DATE: April 2024  REFERRING DIAG:  R29.898 (ICD-10-CM) - Other symptoms and signs involving the musculoskeletal system    THERAPY DIAG:  Muscle weakness (generalized)  Difficulty in walking, not elsewhere classified  Other abnormalities of gait and mobility  Other symptoms and signs involving the musculoskeletal system  Unsteadiness on feet  Rationale for Evaluation and Treatment: Rehabilitation  SUBJECTIVE:  SUBJECTIVE STATEMENT: Underwent T-spine surgery due to metastatic lesions.  Has been participating with home health therapy up until recently. Primarily uses w/c for mobility and uses RW for transfers. Uses tub-transfer bench. Notes continued dysfunction in BLE and limited activity/walking.  Pt accompanied by: family member  PERTINENT HISTORY:  Stage IV (T1b, N2, M1 C) non-small cell lung cancer, adenocarcinoma presented with right lower lobe lung nodule in addition to small bilateral pulmonary nodules and right hilar and mediastinal lymphadenopathy in addition to liver and bone metastasis diagnosed in April 2024. 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 She is currently undergoing palliative systemic chemotherapy and immunotherapy.  PAIN:  Are you having pain? Yes: NPRS scale: 1-2/10 Pain location: mid to low back Pain description: ache Aggravating factors: certain positions/prolonged sitting Relieving factors: change of position  PRECAUTIONS: Fall  RED FLAGS: None   WEIGHT BEARING RESTRICTIONS: No  FALLS: Has patient fallen in last 6 months? Yes. Number of falls 3  LIVING ENVIRONMENT: Lives with: lives with their family Lives in:  House/apartment Stairs: No Has following equipment at home: Dan Humphreys - 2 wheeled and Wheelchair (manual)  PLOF: Independent  PATIENT GOALS: improve mobility to return to walking  OBJECTIVE:   DIAGNOSTIC FINDINGS:  MPRESSION: 1. No acute intracranial abnormality. No evidence for intracranial metastatic disease on this noncontrast examination. 2. Moderately advanced chronic microvascular ischemic disease.  IMPRESSION: 1. Widespread osseous metastatic disease involving the thoracic spine as above. Associated pathologic compression fractures of T6, T7, T8, and T12. Fracture most severe at T7 where there is associated vertebral plana formation. Adjacent epidural extension as above with resultant severe spinal stenosis and cord compression at T7. Emergent neuro surgical consultation recommended. 2. Few scattered probable pulmonary nodules within the partially visualized right lung, concerning for metastatic disease. 3. Possible cholelithiasis.  COGNITION: Overall cognitive status: Within functional limits for tasks assessed   SENSATION: Decreased light touch BLE  COORDINATION: Impaired by LE weakness  EDEMA:  Bilat ankles, non-pitting  MUSCLE TONE: RLE: Hypotonic  MUSCLE LENGTH: WFL  DTRs:  NT  POSTURE: No Significant postural limitations  LOWER EXTREMITY ROM:     Active  Right Eval Left Eval  Hip flexion    Hip extension    Hip abduction    Hip adduction    Hip internal rotation    Hip external rotation    Knee flexion 115 120  Knee extension 0 0  Ankle dorsiflexion 10 10  Ankle plantarflexion    Ankle inversion    Ankle eversion     (Blank rows = not tested)  LOWER EXTREMITY MMT:    MMT Right Eval Left Eval  Hip flexion 3- 3+  Hip extension    Hip abduction 3- 3  Hip adduction 3 3  Hip internal rotation    Hip external rotation    Knee flexion 3 3+  Knee extension 3- 3  Ankle dorsiflexion 2+ 3+  Ankle plantarflexion    Ankle inversion     Ankle eversion    (Blank rows = not tested)  BED MOBILITY:  Mod A--pt reports using lift-chair recliner for sleeping at this time  TRANSFERS: Assistive device utilized: Environmental consultant - 2 wheeled  Sit to stand: SBA and CGA Stand to sit: SBA and CGA Chair to chair: SBA and CGA Floor:  NT    CURB:  Level of Assistance:  NT due to fear/anxiety from recent falls  Assistive device utilized: Environmental consultant - 2 wheeled Curb Comments:   STAIRS: Level of Assistance:  NT due to deficits and fear  GAIT: Gait pattern: Right foot flat, Left foot flat, genu recurvatum- Right, and genu recurvatum- Left Distance walked: 40 ft Assistive device utilized: Walker - 2 wheeled Level of assistance: CGA and Min A Comments: pt reports she was provided AFO at hospital for right ankle--does not have it with her today  FUNCTIONAL TESTS:  5 times sit to stand: requires use of BUE Timed up and go (TUG): 62 sec w/ RW Static standing: Fair balance x 10 sec    PATIENT EDUCATION: Education details: trials of standing at kitchen sink with couch cushion to block knees to prevent buckling and to reduce need for knee hyperextension Person educated: Patient and Child(ren) Education method: Explanation Education comprehension: verbalized understanding  HOME EXERCISE PROGRAM: TBD  GOALS: Goals reviewed with patient? Yes  SHORT TERM GOALS: Target date: 01/22/2023    Patient will be independent in HEP to improve functional outcomes Baseline: Goal status: INITIAL  2.  Demo reduced risk for falls per TUG test time 35 sec  Baseline: 62 sec w/ RW Goal status: INITIAL    LONG TERM GOALS: Target date: 02/05/2023    Demo supervision ambulation x 75 ft w/ RW with multiple turns to improve safety with household ambulation Baseline: CGA-min A x 40 ft Goal status: INITIAL  2.  Demo improved BLE strength as evidenced by 30 sec 5xSTS with minimal use of hands Baseline: unable Goal status: INITIAL  3.  Improve  functional ambulation and safety with ability to negotiate 1-2 steps with HR to improve household navigation Baseline: unable Goal status: INITIAL  4.  Demo improved safety and mobility negotiating up/down curb w/ CGA and AD Baseline: unable Goal status: INITIAL    ASSESSMENT:  CLINICAL IMPRESSION: Patient is a 72 y.o. lady who was seen today for physical therapy evaluation and treatment for generalized weakness and difficulty in walking with high risk for falls.  Exhibits RLE > LE weakness and gait deviations requiring physical assistance for safety.  Currently, unable to negotiate curbs/stairs limiting access to household environment and limited standing tolerance/balance due to deficits.  Patient would benefit from PT interventions to address deficits/limitations and improve functional independence with reduced risk for falls to prepare for home re-entry and reduce level of assistance from caregivers   OBJECTIVE IMPAIRMENTS: Abnormal gait, decreased activity tolerance, decreased balance, decreased coordination, decreased knowledge of use of DME, decreased mobility, difficulty walking, decreased strength, and impaired tone.   ACTIVITY LIMITATIONS: carrying, lifting, bending, standing, stairs, transfers, bed mobility, and locomotion level  PARTICIPATION LIMITATIONS: meal prep, cleaning, laundry, shopping, and community activity  PERSONAL FACTORS: Age, Time since onset of injury/illness/exacerbation, and 1-2 comorbidities: PMH  are also affecting patient's functional outcome.   REHAB POTENTIAL: Good  CLINICAL DECISION MAKING: Evolving/moderate complexity  EVALUATION COMPLEXITY: Moderate  PLAN:  PT FREQUENCY: 2x/week  PT DURATION: 4 weeks  PLANNED INTERVENTIONS: Therapeutic exercises, Therapeutic activity, Neuromuscular re-education, Balance training, Gait training, Patient/Family education, Self Care, Joint mobilization, Stair training, Vestibular training, Canalith repositioning,  Orthotic/Fit training, DME instructions, Aquatic Therapy, Cryotherapy, Moist heat, Taping, and Manual therapy  PLAN FOR NEXT SESSION: review home standing strategy at kitchen counter with cushion, try knee bumps to improve quad control. Develop supine/long sitting HEP that she can perform in recliner for BLE strength   10:38 AM, 01/08/23 M. Shary Decamp, PT, DPT Physical Therapist- [place] Office Number: [phone]

## 2023-01-12 ENCOUNTER — Other Ambulatory Visit: Payer: Self-pay | Admitting: Physician Assistant

## 2023-01-12 DIAGNOSIS — C3491 Malignant neoplasm of unspecified part of right bronchus or lung: Secondary | ICD-10-CM

## 2023-01-13 ENCOUNTER — Inpatient Hospital Stay: Payer: PPO | Attending: Internal Medicine

## 2023-01-13 DIAGNOSIS — Z5111 Encounter for antineoplastic chemotherapy: Secondary | ICD-10-CM | POA: Insufficient documentation

## 2023-01-13 DIAGNOSIS — Z95828 Presence of other vascular implants and grafts: Secondary | ICD-10-CM

## 2023-01-13 DIAGNOSIS — C3431 Malignant neoplasm of lower lobe, right bronchus or lung: Secondary | ICD-10-CM | POA: Diagnosis not present

## 2023-01-13 DIAGNOSIS — C7951 Secondary malignant neoplasm of bone: Secondary | ICD-10-CM | POA: Insufficient documentation

## 2023-01-13 DIAGNOSIS — C3491 Malignant neoplasm of unspecified part of right bronchus or lung: Secondary | ICD-10-CM

## 2023-01-13 DIAGNOSIS — Z5112 Encounter for antineoplastic immunotherapy: Secondary | ICD-10-CM | POA: Insufficient documentation

## 2023-01-13 DIAGNOSIS — Z452 Encounter for adjustment and management of vascular access device: Secondary | ICD-10-CM | POA: Insufficient documentation

## 2023-01-13 DIAGNOSIS — C787 Secondary malignant neoplasm of liver and intrahepatic bile duct: Secondary | ICD-10-CM | POA: Insufficient documentation

## 2023-01-13 LAB — CBC WITH DIFFERENTIAL/PLATELET
Abs Immature Granulocytes: 0.02 10*3/uL (ref 0.00–0.07)
Basophils Absolute: 0 10*3/uL (ref 0.0–0.1)
Basophils Relative: 0 %
Eosinophils Absolute: 0.1 10*3/uL (ref 0.0–0.5)
Eosinophils Relative: 2 %
HCT: 35.5 % — ABNORMAL LOW (ref 36.0–46.0)
Hemoglobin: 12.3 g/dL (ref 12.0–15.0)
Immature Granulocytes: 1 %
Lymphocytes Relative: 15 %
Lymphs Abs: 0.6 10*3/uL — ABNORMAL LOW (ref 0.7–4.0)
MCH: 31.8 pg (ref 26.0–34.0)
MCHC: 34.6 g/dL (ref 30.0–36.0)
MCV: 91.7 fL (ref 80.0–100.0)
Monocytes Absolute: 0.5 10*3/uL (ref 0.1–1.0)
Monocytes Relative: 15 %
Neutro Abs: 2.5 10*3/uL (ref 1.7–7.7)
Neutrophils Relative %: 67 %
Platelets: 151 10*3/uL (ref 150–400)
RBC: 3.87 MIL/uL (ref 3.87–5.11)
RDW: 15.4 % (ref 11.5–15.5)
WBC: 3.7 10*3/uL — ABNORMAL LOW (ref 4.0–10.5)
nRBC: 0 % (ref 0.0–0.2)

## 2023-01-13 LAB — COMPREHENSIVE METABOLIC PANEL
ALT: 49 U/L — ABNORMAL HIGH (ref 0–44)
AST: 30 U/L (ref 15–41)
Albumin: 4.1 g/dL (ref 3.5–5.0)
Alkaline Phosphatase: 110 U/L (ref 38–126)
Anion gap: 6 (ref 5–15)
BUN: 12 mg/dL (ref 8–23)
CO2: 30 mmol/L (ref 22–32)
Calcium: 9.1 mg/dL (ref 8.9–10.3)
Chloride: 103 mmol/L (ref 98–111)
Creatinine, Ser: 0.51 mg/dL (ref 0.44–1.00)
GFR, Estimated: >60 mL/min (ref >60)
Glucose, Bld: 107 mg/dL — ABNORMAL HIGH (ref 70–99)
Potassium: 3.4 mmol/L — ABNORMAL LOW (ref 3.5–5.1)
Sodium: 139 mmol/L (ref 135–145)
Total Bilirubin: 0.3 mg/dL (ref 0.3–1.2)
Total Protein: 6.4 g/dL — ABNORMAL LOW (ref 6.5–8.1)

## 2023-01-13 MED ORDER — HEPARIN SOD (PORK) LOCK FLUSH 100 UNIT/ML IV SOLN
500.0000 [IU] | Freq: Once | INTRAVENOUS | Status: AC
  Administered 2023-01-13: 500 [IU]

## 2023-01-13 MED ORDER — SODIUM CHLORIDE 0.9% FLUSH
10.0000 mL | Freq: Once | INTRAVENOUS | Status: AC
  Administered 2023-01-13: 10 mL

## 2023-01-15 ENCOUNTER — Ambulatory Visit: Payer: PPO | Attending: Family Medicine

## 2023-01-15 DIAGNOSIS — R262 Difficulty in walking, not elsewhere classified: Secondary | ICD-10-CM | POA: Diagnosis not present

## 2023-01-15 DIAGNOSIS — R2689 Other abnormalities of gait and mobility: Secondary | ICD-10-CM | POA: Diagnosis not present

## 2023-01-15 DIAGNOSIS — R2681 Unsteadiness on feet: Secondary | ICD-10-CM | POA: Insufficient documentation

## 2023-01-15 DIAGNOSIS — M6281 Muscle weakness (generalized): Secondary | ICD-10-CM | POA: Insufficient documentation

## 2023-01-15 DIAGNOSIS — R29898 Other symptoms and signs involving the musculoskeletal system: Secondary | ICD-10-CM | POA: Diagnosis not present

## 2023-01-15 NOTE — Therapy (Signed)
OUTPATIENT PHYSICAL THERAPY NEURO TREATMENT   Patient Name: Hayley Wu MRN: [id number] DOB:09/20/1950, 72 y.o., female Today's Date: 01/15/2023   PCP: [name], PA-C REFERRING PROVIDER: [name], PA-C  END OF SESSION:  PT End of Session - 01/15/23 1315     Visit Number 2    Number of Visits 8    Date for PT Re-Evaluation 02/04/23    Authorization Type [employer]    PT Start Time 1315    PT Stop Time 1400    PT Time Calculation (min) 45 min             Past Medical History:  Diagnosis Date   Allergy    Contact lens/glasses fitting    Hypertension    Past Surgical History:  Procedure Laterality Date   ABDOMINAL HYSTERECTOMY     BREAST SURGERY  2010   rt lumpectomy-neg   IR IMAGING GUIDED PORT INSERTION  12/18/2022   LAMINECTOMY N/A 09/21/2022   Procedure: THORACIC LAMINECTOMY FOR TUMOR;  Surgeon: Tia Alert, MD;  Location: [facility] OR;  Service: Neurosurgery;  Laterality: N/A;   OPEN REDUCTION INTERNAL FIXATION (ORIF) DISTAL RADIAL FRACTURE Left 11/26/2013   Procedure: OPEN REDUCTION INTERNAL FIXATION (ORIF) LEFT  DISTAL RADIUS ;  Surgeon: Nestor Lewandowsky, MD;  Location: [facility];  Service: Orthopedics;  Laterality: Left;  with block   TUBAL LIGATION     Patient Active Problem List   Diagnosis Date Noted   Port-A-Cath in place 12/30/2022   Encounter for antineoplastic immunotherapy 11/18/2022   Encounter for antineoplastic chemotherapy 11/18/2022   Constipation 11/05/2022   Goals of care, counseling/discussion 10/21/2022   Cancer, metastatic to bone (HCC) 10/04/2022   Adenocarcinoma of right lung, stage 4 (HCC) 10/01/2022   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 09/20/2022   Hypokalemia 09/20/2022   Lung mass 09/20/2022   Hypothyroidism 09/20/2022   Cholelithiasis 09/20/2022    Hypertension 09/23/2011    ONSET DATE: April 2024  REFERRING DIAG:  R29.898 (ICD-10-CM) - Other symptoms and signs involving the musculoskeletal system    THERAPY DIAG:  Muscle weakness (generalized)  Difficulty in walking, not elsewhere classified  Other abnormalities of gait and mobility  Other symptoms and signs involving the musculoskeletal system  Unsteadiness on feet  Rationale for Evaluation and Treatment: Rehabilitation  SUBJECTIVE:  SUBJECTIVE STATEMENT: Same old same, doing standing at the sink Pt accompanied by: family member  PERTINENT HISTORY:  Stage IV (T1b, N2, M1 C) non-small cell lung cancer, adenocarcinoma presented with right lower lobe lung nodule in addition to small bilateral pulmonary nodules and right hilar and mediastinal lymphadenopathy in addition to liver and bone metastasis diagnosed in April 2024. 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 She is currently undergoing palliative systemic chemotherapy and immunotherapy.  PAIN:  Are you having pain? Yes: NPRS scale: 1-2/10 Pain location: mid to low back Pain description: ache Aggravating factors: certain positions/prolonged sitting Relieving factors: change of position  PRECAUTIONS: Fall  RED FLAGS: None   WEIGHT BEARING RESTRICTIONS: No  FALLS: Has patient fallen in last 6 months? Yes. Number of falls 3  LIVING ENVIRONMENT: Lives with: lives with their family Lives in: House/apartment Stairs: No Has following equipment at home: Environmental consultant - 2 wheeled and Wheelchair (manual)  PLOF: Independent  PATIENT GOALS: improve mobility to return to walking  OBJECTIVE:   TODAY'S TREATMENT: 01/15/23 Activity Comments   Standing at sink -pad against knees and cabinet for extension -TKE with pad and w/c seat for tactile guides 1x10 -mini-squats 1x10 holding onto sink  Gait training (wearing Right AFO) -RW and CGA x 50 ft--excessive hyperextension in stance -RW and CGA 3 x 50 ft left knee cage -RW and CGA 1x100 ft   NU-step resistance intervals x 8 min 30 sec rounds of heavy resist for knee extension strength              DIAGNOSTIC FINDINGS:  MPRESSION: 1. No acute intracranial abnormality. No evidence for intracranial metastatic disease on this noncontrast examination. 2. Moderately advanced chronic microvascular ischemic disease.  IMPRESSION: 1. Widespread osseous metastatic disease involving the thoracic spine as above. Associated pathologic compression fractures of T6, T7, T8, and T12. Fracture most severe at T7 where there is associated vertebral plana formation. Adjacent epidural extension as above with resultant severe spinal stenosis and cord compression at T7. Emergent neuro surgical consultation recommended. 2. Few scattered probable pulmonary nodules within the partially visualized right lung, concerning for metastatic disease. 3. Possible cholelithiasis.  COGNITION: Overall cognitive status: Within functional limits for tasks assessed   SENSATION: Decreased light touch BLE  COORDINATION: Impaired by LE weakness  EDEMA:  Bilat ankles, non-pitting  MUSCLE TONE: RLE: Hypotonic  MUSCLE LENGTH: WFL  DTRs:  NT  POSTURE: No Significant postural limitations  LOWER EXTREMITY ROM:     Active  Right Eval Left Eval  Hip flexion    Hip extension    Hip abduction    Hip adduction    Hip internal rotation    Hip external rotation    Knee flexion 115 120  Knee extension 0 0  Ankle dorsiflexion 10 10  Ankle plantarflexion    Ankle inversion    Ankle eversion     (Blank rows = not tested)  LOWER EXTREMITY MMT:    MMT Right Eval Left Eval  Hip flexion 3- 3+  Hip  extension    Hip abduction 3- 3  Hip adduction 3 3  Hip internal rotation    Hip external rotation    Knee flexion 3 3+  Knee extension 3- 3  Ankle dorsiflexion 2+ 3+  Ankle plantarflexion    Ankle inversion    Ankle eversion    (Blank rows = not tested)  BED  MOBILITY:  Mod A--pt reports using lift-chair recliner for sleeping at this time  TRANSFERS: Assistive device utilized: Environmental consultant - 2 wheeled  Sit to stand: SBA and CGA Stand to sit: SBA and CGA Chair to chair: SBA and CGA Floor:  NT    CURB:  Level of Assistance:  NT due to fear/anxiety from recent falls Assistive device utilized: Environmental consultant - 2 wheeled Curb Comments:   STAIRS: Level of Assistance:  NT due to deficits and fear  GAIT: Gait pattern: Right foot flat, Left foot flat, genu recurvatum- Right, and genu recurvatum- Left Distance walked: 40 ft Assistive device utilized: Environmental consultant - 2 wheeled Level of assistance: CGA and Min A Comments: pt reports she was provided AFO at hospital for right ankle--does not have it with her today  FUNCTIONAL TESTS:  5 times sit to stand: requires use of BUE Timed up and go (TUG): 62 sec w/ RW Static standing: Fair balance x 10 sec    PATIENT EDUCATION: Education details: trials of standing at kitchen sink with couch cushion to block knees to prevent buckling and to reduce need for knee hyperextension Person educated: Patient and Child(ren) Education method: Explanation Education comprehension: verbalized understanding  HOME EXERCISE PROGRAM: TBD  GOALS: Goals reviewed with patient? Yes  SHORT TERM GOALS: Target date: 01/22/2023    Patient will be independent in HEP to improve functional outcomes Baseline: Goal status: INITIAL  2.  Demo reduced risk for falls per TUG test time 35 sec  Baseline: 62 sec w/ RW Goal status: INITIAL    LONG TERM GOALS: Target date: 02/05/2023    Demo supervision ambulation x 75 ft w/ RW with multiple turns to improve safety with  household ambulation Baseline: CGA-min A x 40 ft Goal status: INITIAL  2.  Demo improved BLE strength as evidenced by 30 sec 5xSTS with minimal use of hands Baseline: unable Goal status: INITIAL  3.  Improve functional ambulation and safety with ability to negotiate 1-2 steps with HR to improve household navigation Baseline: unable Goal status: INITIAL  4.  Demo improved safety and mobility negotiating up/down curb w/ CGA and AD Baseline: unable Goal status: INITIAL    ASSESSMENT:  CLINICAL IMPRESSION: Initiated with standing to improve terminal knee extension control to limit degree of hyperextension for stance phase and mini-squats with tactile cues for controlled range to improve proprioception.  Gait training initiated w/ RW and right AFO demonstrating excessive knee hyperextension in stance phase, marked improvement in control and endurance with use fo Swedish knee cage on left knee with ability to traverse 100 ft with subsequent gait trial. Heel lift used on right foot for one trial with marginal improvement in right knee extension control. Ended with heavy resistance NU-step for knee extension strength and control (chair positioned to limit ability to hyperextend). Patient would benefit from continued sessions to progress POC details and improve safety with transfers and gait.  OBJECTIVE IMPAIRMENTS: Abnormal gait, decreased activity tolerance, decreased balance, decreased coordination, decreased knowledge of use of DME, decreased mobility, difficulty walking, decreased strength, and impaired tone.   ACTIVITY LIMITATIONS: carrying, lifting, bending, standing, stairs, transfers, bed mobility, and locomotion level  PARTICIPATION LIMITATIONS: meal prep, cleaning, laundry, shopping, and community activity  PERSONAL FACTORS: Age, Time since onset of injury/illness/exacerbation, and 1-2 comorbidities: PMH  are also affecting patient's functional outcome.   REHAB POTENTIAL:  Good  CLINICAL DECISION MAKING: Evolving/moderate complexity  EVALUATION COMPLEXITY: Moderate  PLAN:  PT FREQUENCY: 2x/week  PT DURATION: 4 weeks  PLANNED INTERVENTIONS: Therapeutic exercises, Therapeutic activity, Neuromuscular re-education, Balance training, Gait training, Patient/Family education, Self Care, Joint mobilization, Stair training, Vestibular training, Canalith repositioning, Orthotic/Fit training, DME instructions, Aquatic Therapy, Cryotherapy, Moist heat, Taping, and Manual therapy  PLAN FOR NEXT SESSION: review home standing strategy at kitchen counter with cushion, try knee bumps to improve quad control. Develop supine/long sitting HEP that she can perform in recliner for BLE strength   1:15 PM, 01/15/23 M. Shary Decamp, PT, DPT Physical Therapist- [place] Office Number: [phone]

## 2023-01-17 ENCOUNTER — Ambulatory Visit: Payer: PPO

## 2023-01-17 DIAGNOSIS — M6281 Muscle weakness (generalized): Secondary | ICD-10-CM | POA: Diagnosis not present

## 2023-01-17 DIAGNOSIS — R2689 Other abnormalities of gait and mobility: Secondary | ICD-10-CM

## 2023-01-17 DIAGNOSIS — R262 Difficulty in walking, not elsewhere classified: Secondary | ICD-10-CM

## 2023-01-17 DIAGNOSIS — R29898 Other symptoms and signs involving the musculoskeletal system: Secondary | ICD-10-CM

## 2023-01-17 DIAGNOSIS — R2681 Unsteadiness on feet: Secondary | ICD-10-CM

## 2023-01-17 MED FILL — Dexamethasone Sodium Phosphate Inj 100 MG/10ML: INTRAMUSCULAR | Qty: 1 | Status: AC

## 2023-01-17 NOTE — Therapy (Signed)
OUTPATIENT PHYSICAL THERAPY NEURO TREATMENT   Patient Name: Hayley Wu MRN: [id number] DOB:07/15/1950, 72 y.o., female Today's Date: 01/17/2023   PCP: [name], PA-C REFERRING PROVIDER: [name], PA-C  END OF SESSION:  PT End of Session - 01/17/23 1031     Visit Number 3    Number of Visits 8    Date for PT Re-Evaluation 02/04/23    Authorization Type [employer]    PT Start Time 1021    PT Stop Time 1100    PT Time Calculation (min) 39 min             Past Medical History:  Diagnosis Date   Allergy    Contact lens/glasses fitting    Hypertension    Past Surgical History:  Procedure Laterality Date   ABDOMINAL HYSTERECTOMY     BREAST SURGERY  2010   rt lumpectomy-neg   IR IMAGING GUIDED PORT INSERTION  12/18/2022   LAMINECTOMY N/A 09/21/2022   Procedure: THORACIC LAMINECTOMY FOR TUMOR;  Surgeon: Tia Alert, MD;  Location: [facility] OR;  Service: Neurosurgery;  Laterality: N/A;   OPEN REDUCTION INTERNAL FIXATION (ORIF) DISTAL RADIAL FRACTURE Left 11/26/2013   Procedure: OPEN REDUCTION INTERNAL FIXATION (ORIF) LEFT  DISTAL RADIUS ;  Surgeon: Nestor Lewandowsky, MD;  Location: [facility];  Service: Orthopedics;  Laterality: Left;  with block   TUBAL LIGATION     Patient Active Problem List   Diagnosis Date Noted   Port-A-Cath in place 12/30/2022   Encounter for antineoplastic immunotherapy 11/18/2022   Encounter for antineoplastic chemotherapy 11/18/2022   Constipation 11/05/2022   Goals of care, counseling/discussion 10/21/2022   Cancer, metastatic to bone (HCC) 10/04/2022   Adenocarcinoma of right lung, stage 4 (HCC) 10/01/2022   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 09/20/2022   Hypokalemia 09/20/2022   Lung mass 09/20/2022   Hypothyroidism 09/20/2022   Cholelithiasis 09/20/2022    Hypertension 09/23/2011    ONSET DATE: April 2024  REFERRING DIAG:  R29.898 (ICD-10-CM) - Other symptoms and signs involving the musculoskeletal system    THERAPY DIAG:  Muscle weakness (generalized)  Difficulty in walking, not elsewhere classified  Other abnormalities of gait and mobility  Other symptoms and signs involving the musculoskeletal system  Unsteadiness on feet  Rationale for Evaluation and Treatment: Rehabilitation  SUBJECTIVE:  SUBJECTIVE STATEMENT: Did some walking with family at home Pt accompanied by: family member  PERTINENT HISTORY:  Stage IV (T1b, N2, M1 C) non-small cell lung cancer, adenocarcinoma presented with right lower lobe lung nodule in addition to small bilateral pulmonary nodules and right hilar and mediastinal lymphadenopathy in addition to liver and bone metastasis diagnosed in April 2024. 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 She is currently undergoing palliative systemic chemotherapy and immunotherapy.  PAIN:  Are you having pain? Yes: NPRS scale: 1-2/10 Pain location: mid to low back Pain description: ache Aggravating factors: certain positions/prolonged sitting Relieving factors: change of position  PRECAUTIONS: Fall  RED FLAGS: None   WEIGHT BEARING RESTRICTIONS: No  FALLS: Has patient fallen in last 6 months? Yes. Number of falls 3  LIVING ENVIRONMENT: Lives with: lives with their family Lives in: House/apartment Stairs: No Has following equipment at home: Environmental consultant - 2 wheeled and Wheelchair (manual)  PLOF: Independent  PATIENT GOALS: improve mobility to return to walking  OBJECTIVE:   TODAY'S TREATMENT: 01/17/23 Activity Comments   NU-step x 8 min Resistance intervals 30 sec heavy; 30 sec light--to improve quad strength/endurance for gait  Forward/backward, sidestepping in parallel bars One lap, increased unsteadiness. Unable to apply knee cage due small size of device  Seated LE PRE OKC PRE 3x10 3#, assist for hip flexion due to weakness              TODAY'S TREATMENT: 01/15/23 Activity Comments  Standing at sink -pad against knees and cabinet for extension -TKE with pad and w/c seat for tactile guides 1x10 -mini-squats 1x10 holding onto sink  Gait training (wearing Right AFO) -RW and CGA x 50 ft--excessive hyperextension in stance -RW and CGA 3 x 50 ft left knee cage -RW and CGA 1x100 ft   NU-step resistance intervals x 8 min 30 sec rounds of heavy resist for knee extension strength              DIAGNOSTIC FINDINGS:  MPRESSION: 1. No acute intracranial abnormality. No evidence for intracranial metastatic disease on this noncontrast examination. 2. Moderately advanced chronic microvascular ischemic disease.  IMPRESSION: 1. Widespread osseous metastatic disease involving the thoracic spine as above. Associated pathologic compression fractures of T6, T7, T8, and T12. Fracture most severe at T7 where there is associated vertebral plana formation. Adjacent epidural extension as above with resultant severe spinal stenosis and cord compression at T7. Emergent neuro surgical consultation recommended. 2. Few scattered probable pulmonary nodules within the partially visualized right lung, concerning for metastatic disease. 3. Possible cholelithiasis.  COGNITION: Overall cognitive status: Within functional limits for tasks assessed   SENSATION: Decreased light touch BLE  COORDINATION: Impaired by LE weakness  EDEMA:  Bilat ankles, non-pitting  MUSCLE TONE: RLE: Hypotonic  MUSCLE LENGTH: WFL  DTRs:  NT  POSTURE: No Significant postural limitations  LOWER EXTREMITY ROM:     Active   Right Eval Left Eval  Hip flexion    Hip extension    Hip abduction    Hip adduction    Hip internal rotation    Hip external rotation    Knee flexion 115 120  Knee extension 0 0  Ankle dorsiflexion 10 10  Ankle plantarflexion    Ankle inversion    Ankle eversion     (Blank rows = not tested)  LOWER EXTREMITY MMT:    MMT Right Eval Left Eval  Hip flexion 3- 3+  Hip extension    Hip abduction 3- 3  Hip adduction 3 3  Hip internal rotation    Hip external rotation    Knee flexion 3 3+  Knee extension 3- 3  Ankle dorsiflexion 2+ 3+  Ankle plantarflexion    Ankle inversion    Ankle eversion    (Blank rows = not tested)  BED MOBILITY:  Mod A--pt reports using lift-chair recliner for sleeping at this time  TRANSFERS: Assistive device utilized: Environmental consultant - 2 wheeled  Sit to stand: SBA and CGA Stand to sit: SBA and CGA Chair to chair: SBA and CGA Floor:  NT    CURB:  Level of Assistance:  NT due to fear/anxiety from recent falls Assistive device utilized: Environmental consultant - 2 wheeled Curb Comments:   STAIRS: Level of Assistance:  NT due to deficits and fear  GAIT: Gait pattern: Right foot flat, Left foot flat, genu recurvatum- Right, and genu recurvatum- Left Distance walked: 40 ft Assistive device utilized: Environmental consultant - 2 wheeled Level of assistance: CGA and Min A Comments: pt reports she was provided AFO at hospital for right ankle--does not have it with her today  FUNCTIONAL TESTS:  5 times sit to stand: requires use of BUE Timed up and go (TUG): 62 sec w/ RW Static standing: Fair balance x 10 sec    PATIENT EDUCATION: Education details: trials of standing at kitchen sink with couch cushion to block knees to prevent buckling and to reduce need for knee hyperextension Person educated: Patient and Child(ren) Education method: Explanation Education comprehension: verbalized understanding  HOME EXERCISE PROGRAM: TBD  GOALS: Goals reviewed with patient?  Yes  SHORT TERM GOALS: Target date: 01/22/2023    Patient will be independent in HEP to improve functional outcomes Baseline: Goal status: IN PROGRESS  2.  Demo reduced risk for falls per TUG test time 35 sec  Baseline: 62 sec w/ RW Goal status: IN PROGRESS    LONG TERM GOALS: Target date: 02/05/2023    Demo supervision ambulation x 75 ft w/ RW with multiple turns to improve safety with household ambulation Baseline: CGA-min A x 40 ft Goal status: INITIAL  2.  Demo improved BLE strength as evidenced by 30 sec 5xSTS with minimal use of hands Baseline: unable Goal status: INITIAL  3.  Improve functional ambulation and safety with ability to negotiate 1-2 steps with HR to improve household navigation Baseline: unable Goal status: INITIAL  4.  Demo improved safety and mobility negotiating up/down curb w/ CGA and AD Baseline: unable Goal status: INITIAL    ASSESSMENT:  CLINICAL IMPRESSION: Initiated with NU-step under heavy resistance demands to improve quad strength for knee extension and ability to limit knee hyperextension.  Attempted dynamic balance but notes increased hip soreness from last session and increased unsteadiness on feet today.  Priority shifted to strengthening for improved recruitment and activity tolerance.  OBJECTIVE IMPAIRMENTS: Abnormal gait, decreased activity tolerance, decreased balance, decreased coordination, decreased knowledge of use of DME, decreased mobility, difficulty walking, decreased strength, and impaired tone.   ACTIVITY LIMITATIONS: carrying, lifting, bending, standing, stairs, transfers, bed mobility, and locomotion level  PARTICIPATION LIMITATIONS: meal prep, cleaning, laundry, shopping, and community activity  PERSONAL FACTORS: Age, Time since onset of injury/illness/exacerbation, and 1-2 comorbidities: PMH  are also affecting patient's functional outcome.   REHAB POTENTIAL: Good  CLINICAL DECISION MAKING: Evolving/moderate  complexity  EVALUATION COMPLEXITY: Moderate  PLAN:  PT FREQUENCY: 2x/week  PT DURATION: 4 weeks  PLANNED  INTERVENTIONS: Therapeutic exercises, Therapeutic activity, Neuromuscular re-education, Balance training, Gait training, Patient/Family education, Self Care, Joint mobilization, Stair training, Vestibular training, Canalith repositioning, Orthotic/Fit training, DME instructions, Aquatic Therapy, Cryotherapy, Moist heat, Taping, and Manual therapy  PLAN FOR NEXT SESSION: review home standing strategy at kitchen counter with cushion, try knee bumps to improve quad control. Develop supine/long sitting HEP that she can perform in recliner for BLE strength   10:32 AM, 01/17/23 M. Shary Decamp, PT, DPT Physical Therapist- [facility] Office Number: [phone]

## 2023-01-20 ENCOUNTER — Other Ambulatory Visit: Payer: Self-pay

## 2023-01-20 ENCOUNTER — Inpatient Hospital Stay (HOSPITAL_BASED_OUTPATIENT_CLINIC_OR_DEPARTMENT_OTHER): Payer: PPO | Admitting: Internal Medicine

## 2023-01-20 ENCOUNTER — Encounter: Payer: Self-pay | Admitting: Medical Oncology

## 2023-01-20 ENCOUNTER — Other Ambulatory Visit: Payer: PPO

## 2023-01-20 ENCOUNTER — Inpatient Hospital Stay: Payer: PPO

## 2023-01-20 DIAGNOSIS — C7951 Secondary malignant neoplasm of bone: Secondary | ICD-10-CM

## 2023-01-20 DIAGNOSIS — C3491 Malignant neoplasm of unspecified part of right bronchus or lung: Secondary | ICD-10-CM

## 2023-01-20 DIAGNOSIS — Z95828 Presence of other vascular implants and grafts: Secondary | ICD-10-CM

## 2023-01-20 DIAGNOSIS — Z5112 Encounter for antineoplastic immunotherapy: Secondary | ICD-10-CM | POA: Diagnosis not present

## 2023-01-20 LAB — CBC WITH DIFFERENTIAL (CANCER CENTER ONLY)
Abs Immature Granulocytes: 0.01 10*3/uL (ref 0.00–0.07)
Basophils Absolute: 0 10*3/uL (ref 0.0–0.1)
Basophils Relative: 1 %
Eosinophils Absolute: 0.1 10*3/uL (ref 0.0–0.5)
Eosinophils Relative: 2 %
HCT: 37.9 % (ref 36.0–46.0)
Hemoglobin: 12.7 g/dL (ref 12.0–15.0)
Immature Granulocytes: 0 %
Lymphocytes Relative: 16 %
Lymphs Abs: 0.5 10*3/uL — ABNORMAL LOW (ref 0.7–4.0)
MCH: 31.4 pg (ref 26.0–34.0)
MCHC: 33.5 g/dL (ref 30.0–36.0)
MCV: 93.8 fL (ref 80.0–100.0)
Monocytes Absolute: 0.4 10*3/uL (ref 0.1–1.0)
Monocytes Relative: 15 %
Neutro Abs: 1.9 10*3/uL (ref 1.7–7.7)
Neutrophils Relative %: 66 %
Platelet Count: 182 10*3/uL (ref 150–400)
RBC: 4.04 MIL/uL (ref 3.87–5.11)
RDW: 15.5 % (ref 11.5–15.5)
WBC Count: 3 10*3/uL — ABNORMAL LOW (ref 4.0–10.5)
nRBC: 0 % (ref 0.0–0.2)

## 2023-01-20 LAB — CMP (CANCER CENTER ONLY)
ALT: 26 U/L (ref 0–44)
AST: 20 U/L (ref 15–41)
Albumin: 4.1 g/dL (ref 3.5–5.0)
Alkaline Phosphatase: 95 U/L (ref 38–126)
Anion gap: 8 (ref 5–15)
BUN: 12 mg/dL (ref 8–23)
CO2: 29 mmol/L (ref 22–32)
Calcium: 9.2 mg/dL (ref 8.9–10.3)
Chloride: 105 mmol/L (ref 98–111)
Creatinine: 0.48 mg/dL (ref 0.44–1.00)
GFR, Estimated: >60 mL/min (ref >60)
Glucose, Bld: 107 mg/dL — ABNORMAL HIGH (ref 70–99)
Potassium: 3.6 mmol/L (ref 3.5–5.1)
Sodium: 142 mmol/L (ref 135–145)
Total Bilirubin: 0.4 mg/dL (ref 0.3–1.2)
Total Protein: 6.5 g/dL (ref 6.5–8.1)

## 2023-01-20 MED ORDER — SODIUM CHLORIDE 0.9 % IV SOLN: Freq: Once | INTRAVENOUS | Status: AC

## 2023-01-20 MED ORDER — HEPARIN SOD (PORK) LOCK FLUSH 100 UNIT/ML IV SOLN
500.0000 [IU] | Freq: Once | INTRAVENOUS | Status: AC | PRN
  Administered 2023-01-20: 500 [IU]

## 2023-01-20 MED ORDER — SODIUM CHLORIDE 0.9% FLUSH
10.0000 mL | INTRAVENOUS | Status: DC | PRN
  Administered 2023-01-20: 10 mL

## 2023-01-20 MED ORDER — SODIUM CHLORIDE 0.9% FLUSH
10.0000 mL | Freq: Once | INTRAVENOUS | Status: AC
  Administered 2023-01-20: 10 mL

## 2023-01-20 MED ORDER — PROCHLORPERAZINE MALEATE 10 MG PO TABS
10.0000 mg | ORAL_TABLET | Freq: Once | ORAL | Status: AC
  Administered 2023-01-20: 10 mg via ORAL
  Filled 2023-01-20: qty 1

## 2023-01-20 MED ORDER — SODIUM CHLORIDE 0.9 % IV SOLN
10.0000 mg | Freq: Once | INTRAVENOUS | Status: AC
  Administered 2023-01-20: 10 mg via INTRAVENOUS
  Filled 2023-01-20: qty 10

## 2023-01-20 MED ORDER — SODIUM CHLORIDE 0.9 % IV SOLN
500.0000 mg/m2 | Freq: Once | INTRAVENOUS | Status: AC
  Administered 2023-01-20: 900 mg via INTRAVENOUS
  Filled 2023-01-20: qty 20

## 2023-01-20 MED ORDER — SODIUM CHLORIDE 0.9 % IV SOLN
200.0000 mg | Freq: Once | INTRAVENOUS | Status: AC
  Administered 2023-01-20: 200 mg via INTRAVENOUS
  Filled 2023-01-20: qty 200

## 2023-01-20 NOTE — Addendum Note (Signed)
Addended by: Charma Igo on: 01/20/2023 11:15 AM   Modules accepted: Orders

## 2023-01-20 NOTE — Patient Instructions (Signed)
[facility]   Discharge Instructions: Thank you for choosing [facility] to provide your oncology and hematology care.   If you have a lab appointment with the Cancer Center, please go directly to the Cancer Center and check in at the registration area.   Wear comfortable clothing and clothing appropriate for easy access to any Portacath or PICC line.   We strive to give you quality time with your provider. You may need to reschedule your appointment if you arrive late (15 or more minutes).  Arriving late affects you and other patients whose appointments are after yours.  Also, if you miss three or more appointments without notifying the office, you may be dismissed from the clinic at the provider's discretion.      For prescription refill requests, have your pharmacy contact our office and allow 72 hours for refills to be completed.    Today you received the following chemotherapy and/or immunotherapy agents: Pembrolizumab (Keytruda) and Pemetrexed (Alimta)      To help prevent nausea and vomiting after your treatment, we encourage you to take your nausea medication as directed.  BELOW ARE SYMPTOMS THAT SHOULD BE REPORTED IMMEDIATELY: *FEVER GREATER THAN 100.4 F (38 C) OR HIGHER *CHILLS OR SWEATING *NAUSEA AND VOMITING THAT IS NOT CONTROLLED WITH YOUR NAUSEA MEDICATION *UNUSUAL SHORTNESS OF BREATH *UNUSUAL BRUISING OR BLEEDING *URINARY PROBLEMS (pain or burning when urinating, or frequent urination) *BOWEL PROBLEMS (unusual diarrhea, constipation, pain near the anus) TENDERNESS IN MOUTH AND THROAT WITH OR WITHOUT PRESENCE OF ULCERS (sore throat, sores in mouth, or a toothache) UNUSUAL RASH, SWELLING OR PAIN  UNUSUAL VAGINAL DISCHARGE OR ITCHING   Items with * indicate a potential emergency and should be followed up as soon as possible or go to the Emergency Department if any problems should occur.  Please show the CHEMOTHERAPY ALERT  CARD or IMMUNOTHERAPY ALERT CARD at check-in to the Emergency Department and triage nurse.  Should you have questions after your visit or need to cancel or reschedule your appointment, please contact [facility]  Dept: [phone]  and follow the prompts.  Office hours are 8:00 a.m. to 4:30 p.m. Monday - Friday. Please note that voicemails left after 4:00 p.m. may not be returned until the following business day.  We are closed weekends and major holidays. You have access to a nurse at all times for urgent questions. Please call the main number to the clinic Dept: [phone] and follow the prompts.   For any non-urgent questions, you may also contact your provider using MyChart. We now offer e-Visits for anyone 18 and older to request care online for non-urgent symptoms. For details visit [email or web address].   Also download the MyChart app! Go to the app store, search "MyChart", open the app, select [facility], and log in with your MyChart username and password. 

## 2023-01-20 NOTE — Progress Notes (Signed)
[name] [phone]   [phone]  OFFICE PROGRESS NOTE  Hayley Hatch, PA-C [address] [place] [address]  DIAGNOSIS: Stage IV (T1b, N2, M1 C) non-small cell lung cancer, adenocarcinoma presented with right lower lobe lung nodule in addition to small bilateral pulmonary nodules and right hilar and mediastinal lymphadenopathy in addition to liver and bone metastasis diagnosed in April 2024. 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   PDL1: 20%   Guardant 360: no actionable mutations.    PRIOR THERAPY: 1) Decompressive thoracic laminectomy of the T7 with spinal cord decompression and biopsy of the epidural mass in April 2024.  2) Palliative radiation to the bone under the care of Dr. Basilio Cairo. Last day scheduled for 10/25/22    CURRENT THERAPY: Carboplatin for an AUC of 5, Alimta 500 mg/m, Keytruda 200 mg IV every 3 weeks.  First dose expected on 10/28/2022. Status post 4 cycles.  Starting from cycle #5 the patient is on maintenance treatment with Alimta and Keytruda every 3 weeks.  INTERVAL HISTORY: Hayley Wu 72 y.o. female returns to the clinic today for follow-up visit accompanied by her daughter.  The patient is feeling fine today with no concerning complaints.  She denied having any chest pain, shortness of breath, cough or hemoptysis.  She denied having any fever or chills.  She has no nausea, vomiting, diarrhea or constipation.  She has no headache or visual changes.  She denied having any recent weight loss or night sweats.  She is here today for evaluation before starting cycle #5 of her treatment.   MEDICAL HISTORY: Past Medical History:  Diagnosis Date   Allergy    Contact lens/glasses fitting    Hypertension     ALLERGIES:  is allergic to erythromycin, amoxicillin, latex, and prednisone.  MEDICATIONS:   Current Outpatient Medications  Medication Sig Dispense Refill   acetaminophen (TYLENOL) 500 MG tablet Take 500 mg by mouth every 6 (six) hours as needed for moderate pain.     amLODipine (NORVASC) 10 MG tablet Take 10 mg by mouth daily.     folic acid (FOLVITE) 1 MG tablet Take 1 tablet by mouth once daily 30 tablet 0   levothyroxine (SYNTHROID) 100 MCG tablet Take 1 tablet (100 mcg total) by mouth daily at 6 (six) AM. 30 tablet 2   lidocaine (XYLOCAINE) 2 % solution Patient: Mix 1part 2% viscous lidocaine, 1part H20. Swallow 10mL of diluted mixture, before meals and at bedtime, up to QID (Patient not taking: Reported on 12/09/2022) 200 mL 2   lidocaine-prilocaine (EMLA) cream Apply 1 Application topically as needed. 30 g 2   losartan (COZAAR) 100 MG tablet Take 100 mg by mouth daily.     oxyCODONE (OXY IR/ROXICODONE) 5 MG immediate release tablet Take 1 tablet (5 mg total) by mouth every 3 (three) hours as needed for moderate pain ((score 4 to 6)). 30 tablet 0   prochlorperazine (COMPAZINE) 10 MG tablet Take 1 tablet (10 mg total) by mouth every 6 (six) hours as needed. 30 tablet 2   senna (SENOKOT) 8.6 MG TABS tablet Take 1 tablet (8.6 mg total) by mouth 2 (two) times daily. 120 tablet 0   VITAMIN D PO Take 1 tablet by mouth daily.     No current facility-administered medications for this visit.    SURGICAL HISTORY:  Past Surgical History:  Procedure Laterality Date   ABDOMINAL HYSTERECTOMY     BREAST SURGERY  2010   rt lumpectomy-neg   IR IMAGING GUIDED PORT INSERTION  12/18/2022   LAMINECTOMY N/A 09/21/2022   Procedure: THORACIC LAMINECTOMY FOR TUMOR;  Surgeon: Tia Alert, MD;  Location: [facility] OR;  Service: Neurosurgery;  Laterality: N/A;   OPEN REDUCTION INTERNAL FIXATION (ORIF) DISTAL RADIAL FRACTURE Left 11/26/2013   Procedure: OPEN REDUCTION INTERNAL FIXATION (ORIF) LEFT  DISTAL RADIUS ;  Surgeon: Nestor Lewandowsky, MD;  Location:  SURGERY CENTER;  Service: Orthopedics;   Laterality: Left;  with block   TUBAL LIGATION      REVIEW OF SYSTEMS:  A comprehensive review of systems was negative.   PHYSICAL EXAMINATION: General appearance: alert, cooperative, and no distress Head: Normocephalic, without obvious abnormality, atraumatic Neck: no adenopathy, no JVD, supple, symmetrical, trachea midline, and thyroid not enlarged, symmetric, no tenderness/mass/nodules Lymph nodes: Cervical, supraclavicular, and axillary nodes normal. Resp: clear to auscultation bilaterally Back: symmetric, no curvature. ROM normal. No CVA tenderness. Cardio: regular rate and rhythm, S1, S2 normal, no murmur, click, rub or gallop GI: soft, non-tender; bowel sounds normal; no masses,  no organomegaly Extremities: extremities normal, atraumatic, no cyanosis or edema  ECOG PERFORMANCE STATUS: 1 - Symptomatic but completely ambulatory  Blood pressure 124/64, pulse 96, temperature 97.9 F (36.6 C), temperature source Oral, resp. rate 17, height 5\' 3"  (1.6 m), weight 152 lb (68.9 kg), SpO2 98%.  LABORATORY DATA: Lab Results  Component Value Date   WBC 3.7 (L) 01/13/2023   HGB 12.3 01/13/2023   HCT 35.5 (L) 01/13/2023   MCV 91.7 01/13/2023   PLT 151 01/13/2023      Chemistry      Component Value Date/Time   NA 139 01/13/2023 1417   K 3.4 (L) 01/13/2023 1417   CL 103 01/13/2023 1417   CO2 30 01/13/2023 1417   BUN 12 01/13/2023 1417   CREATININE 0.51 01/13/2023 1417   CREATININE 0.45 12/30/2022 1027      Component Value Date/Time   CALCIUM 9.1 01/13/2023 1417   ALKPHOS 110 01/13/2023 1417   AST 30 01/13/2023 1417   AST 16 12/30/2022 1027   ALT 49 (H) 01/13/2023 1417   ALT 31 12/30/2022 1027   BILITOT 0.3 01/13/2023 1417   BILITOT 0.5 12/30/2022 1027       RADIOGRAPHIC STUDIES: CT CHEST ABDOMEN PELVIS W CONTRAST  Result Date: 12/22/2022 CLINICAL DATA:  Lung cancer restaging, status post radiation therapy, ongoing chemotherapy * Tracking Code: BO * EXAM: CT CHEST,  ABDOMEN, AND PELVIS WITH CONTRAST TECHNIQUE: Multidetector CT imaging of the chest, abdomen and pelvis was performed following the standard protocol during bolus administration of intravenous contrast. RADIATION DOSE REDUCTION: This exam was performed according to the departmental dose-optimization program which includes automated exposure control, adjustment of the mA and/or kV according to patient size and/or use of iterative reconstruction technique. CONTRAST:  70mL OMNIPAQUE IOHEXOL 300 MG/ML  SOLN COMPARISON:  PET-CT, 10/17/2022, CT chest abdomen pelvis, 09/20/2022 FINDINGS: CT CHEST FINDINGS Cardiovascular: Right chest port catheter. Aortic atherosclerosis. Normal heart size. Left and right coronary artery calcifications. No pericardial effusion. Mediastinum/Nodes: Diminished size of previously seen enlarged FDG avid right hilar and pretracheal lymph nodes, subcarinal node measuring 1.1 x 0.9 cm, previously 1.4 x 1.3 cm (series 2, image 24). Thyroid gland, trachea, and esophagus demonstrate no significant findings. Lungs/Pleura: Moderate centrilobular and paraseptal emphysema. Diffuse bilateral bronchial wall thickening. Diminished size and fullness  of a spiculated nodule of the peripheral right lower lobe, measuring 2.9 x 1.3 cm, previously 3.1 x 1.6 cm when measured similarly (series 4, image 77). Unchanged spiculated nodule of the right upper lobe measuring 0.8 x 0.6 cm (series 4, image 31). Additional tiny bilateral pulmonary nodules are unchanged or slightly diminished, for example near complete resolution of a tiny nodule of the right apex measuring 0.2 cm, previously 0.4 cm (series 4, image 21) unchanged irregular nodule of the peripheral left upper lobe measuring 0.6 cm (series 4, image 24). No pleural effusion or pneumothorax. Musculoskeletal: No chest wall abnormality. No acute osseous findings. CT ABDOMEN PELVIS FINDINGS Hepatobiliary: Interval decrease in size of multiple hypodense, previously FDG  avid hepatic metastases, largest in the right lobe of the liver, hepatic segment V measuring 1.5 x 1.1 cm, previously 2.2 x 2.1 cm (series 2, image 58). Background of multiple additional simple fluid attenuation cysts, benign, requiring no specific further follow-up or characterization rim calcified gallstone. No gallbladder wall thickening, or biliary dilatation. Pancreas: Unremarkable. No pancreatic ductal dilatation or surrounding inflammatory changes. Spleen: Normal in size without significant abnormality. Adrenals/Urinary Tract: Adrenal glands are unremarkable. Punctuate nonobstructive calculus of the inferior pole of the left kidney. No right-sided calculi, ureteral calculi, or hydronephrosis. Bladder is unremarkable. Stomach/Bowel: Stomach is within normal limits. Appendix appears normal. No evidence of bowel wall thickening, distention, or inflammatory changes. Sigmoid diverticulosis. Vascular/Lymphatic: Aortic atherosclerosis. No enlarged abdominal or pelvic lymph nodes. Reproductive: Status post hysterectomy. Other: No abdominal wall hernia or abnormality. No ascites. Musculoskeletal: No acute osseous findings. Interval increase in sclerosis of multiple osseous metastases of the vertebral bodies, most notably a high-grade, vertebral plana wedge deformity of T7 (series 6, image 59). IMPRESSION: 1. Diminished size and fullness of a spiculated nodule of the peripheral right lower lobe, consistent with treatment response. 2. Unchanged spiculated nodule of the right upper lobe measuring 0.8 x 0.6 cm. Other small nodules slightly improved or unchanged. 3. Diminished size of previously seen enlarged FDG avid right hilar and pretracheal lymph nodes. 4. Interval improvement of multiple hepatic metastases. 5. Interval post treatment sclerosis of multiple osseous metastases of the vertebral bodies, most notably a high-grade, vertebral plana wedge deformity of T7. 6. Constellation of findings is consistent with  treatment response of primary lung malignancy and metastatic disease. 7. Emphysema and diffuse bilateral bronchial wall thickening. 8. Cholelithiasis. 9. Punctuate nonobstructive left nephrolithiasis. 10. Coronary artery disease. Aortic Atherosclerosis (ICD10-I70.0) and Emphysema (ICD10-J43.9). Electronically Signed   By: Jearld Lesch M.D.   On: 12/22/2022 15:10    ASSESSMENT AND PLAN: This is a very pleasant 72 years old white female with Stage IV (T1b, N2, M1 C) non-small cell lung cancer, adenocarcinoma presented with right lower lobe lung nodule in addition to small bilateral pulmonary nodules and right hilar and mediastinal lymphadenopathy in addition to liver and bone metastasis diagnosed in April 2024. 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 Molecular studies showed no actionable mutations and PD-L1 expression of 20%. 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  She also had palliative radiation to the bone under the care of Dr. Basilio Cairo. Last day scheduled for 10/25/22. She is currently on palliative systemic chemotherapy with carboplatin for AUC of 5, Alimta 500 Mg/M2 and Keytruda 200 Mg IV every 3 weeks status post  4 cycles.  Starting from cycle #5 the patient is on maintenance treatment with Alimta and Keytruda every 3 weeks. She has been tolerating her treatment fairly well. I recommended for her to proceed with cycle #5 today as planned. I will see her back for follow-up visit in 3 weeks for evaluation before the next cycle of her treatment. The patient was advised to call immediately if she has any other concerning symptoms in the interval. The patient voices understanding of current disease status and treatment options and is in agreement with the current care plan.  All questions were  answered. The patient knows to call the clinic with any problems, questions or concerns. We can certainly see the patient much sooner if necessary.  The total time spent in the appointment was 20 minutes.  Disclaimer: This note was dictated with voice recognition software. Similar sounding words can inadvertently be transcribed and may not be corrected upon review.

## 2023-01-20 NOTE — Progress Notes (Signed)
Patient seen by Dr. Mohamed  Vitals are within treatment parameters.  Labs reviewed: and are within treatment parameters.  Per physician team, patient is ready for treatment and there are NO modifications to the treatment plan.  

## 2023-01-21 ENCOUNTER — Encounter: Payer: Self-pay | Admitting: Physical Therapy

## 2023-01-21 ENCOUNTER — Ambulatory Visit: Payer: PPO | Admitting: Physical Therapy

## 2023-01-21 DIAGNOSIS — R29898 Other symptoms and signs involving the musculoskeletal system: Secondary | ICD-10-CM

## 2023-01-21 DIAGNOSIS — M6281 Muscle weakness (generalized): Secondary | ICD-10-CM

## 2023-01-21 DIAGNOSIS — R2689 Other abnormalities of gait and mobility: Secondary | ICD-10-CM

## 2023-01-21 DIAGNOSIS — R2681 Unsteadiness on feet: Secondary | ICD-10-CM

## 2023-01-21 NOTE — Therapy (Signed)
OUTPATIENT PHYSICAL THERAPY NEURO TREATMENT   Patient Name: Hayley Wu MRN: [id number] DOB:[birth year], 72 y.o., female Today's Date: 01/21/2023   PCP: [name], PA-C REFERRING PROVIDER: [name], PA-C  END OF SESSION:  PT End of Session - 01/21/23 1316     Visit Number 4    Number of Visits 8    Date for PT Re-Evaluation 02/04/23    Authorization Type [employer]    PT Start Time 1318    PT Stop Time 1400    PT Time Calculation (min) 42 min    Activity Tolerance Patient tolerated treatment well    Behavior During Therapy WFL for tasks assessed/performed              Past Medical History:  Diagnosis Date   Allergy    Contact lens/glasses fitting    Hypertension    Past Surgical History:  Procedure Laterality Date   ABDOMINAL HYSTERECTOMY     BREAST SURGERY  2010   rt lumpectomy-neg   IR IMAGING GUIDED PORT INSERTION  12/18/2022   LAMINECTOMY N/A 09/21/2022   Procedure: THORACIC LAMINECTOMY FOR TUMOR;  Surgeon: Tia Alert, MD;  Location: [facility] OR;  Service: Neurosurgery;  Laterality: N/A;   OPEN REDUCTION INTERNAL FIXATION (ORIF) DISTAL RADIAL FRACTURE Left 11/26/2013   Procedure: OPEN REDUCTION INTERNAL FIXATION (ORIF) LEFT  DISTAL RADIUS ;  Surgeon: Nestor Lewandowsky, MD;  Location: [facility];  Service: Orthopedics;  Laterality: Left;  with block   TUBAL LIGATION     Patient Active Problem List   Diagnosis Date Noted   Port-A-Cath in place 12/30/2022   Encounter for antineoplastic immunotherapy 11/18/2022   Encounter for antineoplastic chemotherapy 11/18/2022   Constipation 11/05/2022   Goals of care, counseling/discussion 10/21/2022   Cancer, metastatic to bone (HCC) 10/04/2022   Adenocarcinoma of right lung, stage 4 (HCC) 10/01/2022   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  09/20/2022   Hypokalemia 09/20/2022   Lung mass 09/20/2022   Hypothyroidism 09/20/2022   Cholelithiasis 09/20/2022   Hypertension 09/23/2011    ONSET DATE: April 2024  REFERRING DIAG:  R29.898 (ICD-10-CM) - Other symptoms and signs involving the musculoskeletal system    THERAPY DIAG:  Muscle weakness (generalized)  Other abnormalities of gait and mobility  Other symptoms and signs involving the musculoskeletal system  Unsteadiness on feet  Rationale for Evaluation and Treatment: Rehabilitation  SUBJECTIVE:  SUBJECTIVE STATEMENT: Had chemo yesterday, so I may be a little off today.  Typically, I'm just exhausted. Notice little things week by week that get better. Pt accompanied by: family member  PERTINENT HISTORY:  Stage IV (T1b, N2, M1 C) non-small cell lung cancer, adenocarcinoma presented with right lower lobe lung nodule in addition to small bilateral pulmonary nodules and right hilar and mediastinal lymphadenopathy in addition to liver and bone metastasis diagnosed in April 2024. 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 She is currently undergoing palliative systemic chemotherapy and immunotherapy.  PAIN:  Are you having pain? Yes: NPRS scale: 1-2/10 Pain location: mid to low back Pain description: ache Aggravating factors: certain positions/prolonged sitting Relieving factors: change of position  PRECAUTIONS: Fall  RED FLAGS: None   WEIGHT BEARING RESTRICTIONS: No  FALLS: Has patient fallen in last 6 months? Yes. Number of falls 3  LIVING ENVIRONMENT: Lives with: lives with their family Lives in: House/apartment Stairs: No Has following equipment at home: Environmental consultant - 2 wheeled  and Wheelchair (manual)  PLOF: Independent  PATIENT GOALS: improve mobility to return to walking  OBJECTIVE:    TODAY'S TREATMENT: 01/21/2023 Activity Comments  NuStep, Level 4, 4 extremities x 8 minutes Intervals of increased/low intensity for improved hip flexion/quad stregnth  TUG 61.84 sec RW  Seated glut sets Seated hip adduction Seated hip abduction Seated march Seated LAQ No weight, AROM  Weighted PRE strenghtening: Bilat hip flexion 2 x 10 Bilat knee flexion 2 x 10 (red theraband) Bilat knee extension 2 x 10 3# weight  Seated hamstring stretch, 2 x 30" Foot propped on block, cues for technique  Seated gastroc stretch, foot propped on step, with strap, 2 x 15 sec Cues for technique   Access Code: [email or web address] URL: [email or web address] Date: 01/21/2023 Prepared by: [facility] - Outpatient  Rehab - [facility]  Exercises - Seated Hamstring Stretch  - 1-2 x daily - 7 x weekly - 1 sets - 3 reps - 15-30 sec hold - Seated Gastroc Stretch with Strap  - 1-2 x daily - 7 x weekly - 1 sets - 3 reps - 15-30 sec hold DIAGNOSTIC FINDINGS:  MPRESSION: 1. No acute intracranial abnormality. No evidence for intracranial metastatic disease on this noncontrast examination. 2. Moderately advanced chronic microvascular ischemic disease.  IMPRESSION: 1. Widespread osseous metastatic disease involving the thoracic spine as above. Associated pathologic compression fractures of T6, T7, T8, and T12. Fracture most severe at T7 where there is associated vertebral plana formation. Adjacent epidural extension as above with resultant severe spinal stenosis and cord compression at T7. Emergent neuro surgical consultation recommended. 2. Few scattered probable pulmonary nodules within the partially visualized right lung, concerning for metastatic disease. 3. Possible cholelithiasis.  COGNITION: Overall cognitive status: Within functional limits for tasks  assessed   SENSATION: Decreased light touch BLE  COORDINATION: Impaired by LE weakness  EDEMA:  Bilat ankles, non-pitting  MUSCLE TONE: RLE: Hypotonic  MUSCLE LENGTH: WFL  DTRs:  NT  POSTURE: No Significant postural limitations  LOWER EXTREMITY ROM:     Active  Right Eval Left Eval  Hip flexion    Hip extension    Hip abduction    Hip adduction    Hip internal rotation    Hip external rotation    Knee flexion 115 120  Knee extension 0 0  Ankle dorsiflexion 10 10  Ankle plantarflexion  Ankle inversion    Ankle eversion     (Blank rows = not tested)  LOWER EXTREMITY MMT:    MMT Right Eval Left Eval  Hip flexion 3- 3+  Hip extension    Hip abduction 3- 3  Hip adduction 3 3  Hip internal rotation    Hip external rotation    Knee flexion 3 3+  Knee extension 3- 3  Ankle dorsiflexion 2+ 3+  Ankle plantarflexion    Ankle inversion    Ankle eversion    (Blank rows = not tested)  BED MOBILITY:  Mod A--pt reports using lift-chair recliner for sleeping at this time  TRANSFERS: Assistive device utilized: Environmental consultant - 2 wheeled  Sit to stand: SBA and CGA Stand to sit: SBA and CGA Chair to chair: SBA and CGA Floor:  NT    CURB:  Level of Assistance:  NT due to fear/anxiety from recent falls Assistive device utilized: Environmental consultant - 2 wheeled Curb Comments:   STAIRS: Level of Assistance:  NT due to deficits and fear  GAIT: Gait pattern: Right foot flat, Left foot flat, genu recurvatum- Right, and genu recurvatum- Left Distance walked: 40 ft Assistive device utilized: Environmental consultant - 2 wheeled Level of assistance: CGA and Min A Comments: pt reports she was provided AFO at hospital for right ankle--does not have it with her today  FUNCTIONAL TESTS:  5 times sit to stand: requires use of BUE Timed up and go (TUG): 62 sec w/ RW Static standing: Fair balance x 10 sec    PATIENT EDUCATION: Education details: trials of standing at kitchen sink with couch  cushion to block knees to prevent buckling and to reduce need for knee hyperextension Person educated: Patient and Child(ren) Education method: Explanation Education comprehension: verbalized understanding  HOME EXERCISE PROGRAM: TBD  GOALS: Goals reviewed with patient? Yes  SHORT TERM GOALS: Target date: 01/22/2023    Patient will be independent in HEP to improve functional outcomes Baseline:formal HEP initiated 01/21/2023 Goal status: IN PROGRESS  2.  Demo reduced risk for falls per TUG test time 35 sec  Baseline: 62 sec w/ RW>61 sec 01/21/2023 Goal status: IN PROGRESS    LONG TERM GOALS: Target date: 02/05/2023    Demo supervision ambulation x 75 ft w/ RW with multiple turns to improve safety with household ambulation Baseline: CGA-min A x 40 ft Goal status: INITIAL  2.  Demo improved BLE strength as evidenced by 30 sec 5xSTS with minimal use of hands Baseline: unable Goal status: INITIAL  3.  Improve functional ambulation and safety with ability to negotiate 1-2 steps with HR to improve household navigation Baseline: unable Goal status: INITIAL  4.  Demo improved safety and mobility negotiating up/down curb w/ CGA and AD Baseline: unable Goal status: INITIAL    ASSESSMENT:  CLINICAL IMPRESSION: Skilled PT session today with initial look at STGs, with pt informally working on leg exercises seated, as she tolerates.  She notes improved hip motion and ankle motion in the past few weeks; initiated formal HEP with stretches and will need to add strengthening to HEP.  STG 1 and 2 still in progress not yet met; with short distance gait for TUG, pt notes having less recurvatum/hyperextension in knees than previous bouts of gait.  She will continue to benefit from skilled PT towards goals for improved functional mobility and strength.    OBJECTIVE IMPAIRMENTS: Abnormal gait, decreased activity tolerance, decreased balance, decreased coordination, decreased knowledge of use of  DME, decreased mobility, difficulty  walking, decreased strength, and impaired tone.   ACTIVITY LIMITATIONS: carrying, lifting, bending, standing, stairs, transfers, bed mobility, and locomotion level  PARTICIPATION LIMITATIONS: meal prep, cleaning, laundry, shopping, and community activity  PERSONAL FACTORS: Age, Time since onset of injury/illness/exacerbation, and 1-2 comorbidities: PMH  are also affecting patient's functional outcome.   REHAB POTENTIAL: Good  CLINICAL DECISION MAKING: Evolving/moderate complexity  EVALUATION COMPLEXITY: Moderate  PLAN:  PT FREQUENCY: 2x/week  PT DURATION: 4 weeks  PLANNED INTERVENTIONS: Therapeutic exercises, Therapeutic activity, Neuromuscular re-education, Balance training, Gait training, Patient/Family education, Self Care, Joint mobilization, Stair training, Vestibular training, Canalith repositioning, Orthotic/Fit training, DME instructions, Aquatic Therapy, Cryotherapy, Moist heat, Taping, and Manual therapy  PLAN FOR NEXT SESSION: Review and progress HEP; review home standing strategy at kitchen counter with cushion, try knee bumps to improve quad control. Develop supine/long sitting HEP that she can perform in recliner for BLE strength   Lonia Blood, PT 01/21/23 4:33 PM Phone: [phone] Fax: [phone]  [facility] at [facility] Neuro [address] [place], [address] [place], [place] [address] Phone # [phone] Fax # [phone]

## 2023-01-23 ENCOUNTER — Ambulatory Visit: Payer: PPO | Admitting: Physical Therapy

## 2023-01-23 ENCOUNTER — Encounter: Payer: Self-pay | Admitting: Physical Therapy

## 2023-01-23 DIAGNOSIS — M6281 Muscle weakness (generalized): Secondary | ICD-10-CM | POA: Diagnosis not present

## 2023-01-23 DIAGNOSIS — R29898 Other symptoms and signs involving the musculoskeletal system: Secondary | ICD-10-CM

## 2023-01-23 DIAGNOSIS — R2681 Unsteadiness on feet: Secondary | ICD-10-CM

## 2023-01-23 DIAGNOSIS — R2689 Other abnormalities of gait and mobility: Secondary | ICD-10-CM

## 2023-01-23 NOTE — Therapy (Signed)
OUTPATIENT PHYSICAL THERAPY NEURO TREATMENT   Patient Name: Hayley Wu MRN: [id number] DOB:1951-01-10, 72 y.o., female Today's Date: 01/24/2023   PCP: [name], PA-C REFERRING PROVIDER: [name], PA-C  END OF SESSION:  PT End of Session - 01/23/23 1404     Visit Number 5    Number of Visits 8    Date for PT Re-Evaluation 02/04/23    Authorization Type [employer]    PT Start Time 1404    PT Stop Time 1444    PT Time Calculation (min) 40 min    Activity Tolerance Patient tolerated treatment well    Behavior During Therapy WFL for tasks assessed/performed              Past Medical History:  Diagnosis Date   Allergy    Contact lens/glasses fitting    Hypertension    Past Surgical History:  Procedure Laterality Date   ABDOMINAL HYSTERECTOMY     BREAST SURGERY  2010   rt lumpectomy-neg   IR IMAGING GUIDED PORT INSERTION  12/18/2022   LAMINECTOMY N/A 09/21/2022   Procedure: THORACIC LAMINECTOMY FOR TUMOR;  Surgeon: Tia Alert, MD;  Location: [facility] OR;  Service: Neurosurgery;  Laterality: N/A;   OPEN REDUCTION INTERNAL FIXATION (ORIF) DISTAL RADIAL FRACTURE Left 11/26/2013   Procedure: OPEN REDUCTION INTERNAL FIXATION (ORIF) LEFT  DISTAL RADIUS ;  Surgeon: Nestor Lewandowsky, MD;  Location: [facility];  Service: Orthopedics;  Laterality: Left;  with block   TUBAL LIGATION     Patient Active Problem List   Diagnosis Date Noted   Port-A-Cath in place 12/30/2022   Encounter for antineoplastic immunotherapy 11/18/2022   Encounter for antineoplastic chemotherapy 11/18/2022   Constipation 11/05/2022   Goals of care, counseling/discussion 10/21/2022   Cancer, metastatic to bone (HCC) 10/04/2022   Adenocarcinoma of right lung, stage 4 (HCC) 10/01/2022   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  09/20/2022   Hypokalemia 09/20/2022   Lung mass 09/20/2022   Hypothyroidism 09/20/2022   Cholelithiasis 09/20/2022   Hypertension 09/23/2011    ONSET DATE: April 2024  REFERRING DIAG:  R29.898 (ICD-10-CM) - Other symptoms and signs involving the musculoskeletal system    THERAPY DIAG:  Muscle weakness (generalized)  Other abnormalities of gait and mobility  Other symptoms and signs involving the musculoskeletal system  Unsteadiness on feet  Rationale for Evaluation and Treatment: Rehabilitation  SUBJECTIVE:  SUBJECTIVE STATEMENT: Haven't felt quite as bad this week after this round of chemo.  Did rest very well yesterday.    Pt accompanied by: family member  PERTINENT HISTORY:  Stage IV (T1b, N2, M1 C) non-small cell lung cancer, adenocarcinoma presented with right lower lobe lung nodule in addition to small bilateral pulmonary nodules and right hilar and mediastinal lymphadenopathy in addition to liver and bone metastasis diagnosed in April 2024. 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 She is currently undergoing palliative systemic chemotherapy and immunotherapy.  PAIN:  Are you having pain? Yes: NPRS scale: 1-2/10 Pain location: mid to low back Pain description: ache Aggravating factors: certain positions/prolonged sitting Relieving factors: change of position  PRECAUTIONS: Fall  RED FLAGS: None   WEIGHT BEARING RESTRICTIONS: No  FALLS: Has patient fallen in last 6 months? Yes. Number of falls 3  LIVING ENVIRONMENT: Lives with: lives with their family Lives in: House/apartment Stairs: No Has following equipment at home: Environmental consultant - 2 wheeled and Wheelchair (manual)  PLOF:  Independent  PATIENT GOALS: improve mobility to return to walking  OBJECTIVE:   TODAY'S TREATMENT: 01/23/2023 Activity Comments  NuStep, Level 4, 4 extremities x 8 minutes Intervals of increased/low intensity for improved hip flexion/quad stregnth  Reviewed recent stretch additions to HEP Pt return demo understanding; cues to avoid excess/painful stretch  Long sit Long sit V Circle-figure 4 sit positions Brief education in these positions  Seated heel digs, for BLE to propel w/c forward/back 10 ft x 4 reps For hamstring strengthening  Gait 100 ft with RW with min guard assist Increased recurvatum with fatigue  Standing min squat to high cushion surface, 5 reps, 2 sets   Gait 50 ft with RW with min guard Less scissoring gait compared to last visit   Access Code: [email or web address] URL: [email or web address] Date: 01/21/2023 Prepared by: [facility] - Outpatient  Rehab - [facility]  Exercises - Seated Hamstring Stretch  - 1-2 x daily - 7 x weekly - 1 sets - 3 reps - 15-30 sec hold - Seated Gastroc Stretch with Strap  - 1-2 x daily - 7 x weekly - 1 sets - 3 reps - 15-30 sec hold  ------------------------------------------------------------------------------------------- DIAGNOSTIC FINDINGS:  MPRESSION: 1. No acute intracranial abnormality. No evidence for intracranial metastatic disease on this noncontrast examination. 2. Moderately advanced chronic microvascular ischemic disease.  IMPRESSION: 1. Widespread osseous metastatic disease involving the thoracic spine as above. Associated pathologic compression fractures of T6, T7, T8, and T12. Fracture most severe at T7 where there is associated vertebral plana formation. Adjacent epidural extension as above with resultant severe spinal stenosis and cord compression at T7. Emergent neuro surgical consultation recommended. 2. Few scattered probable pulmonary nodules within the partially visualized right lung, concerning for  metastatic disease. 3. Possible cholelithiasis.  COGNITION: Overall cognitive status: Within functional limits for tasks assessed   SENSATION: Decreased light touch BLE  COORDINATION: Impaired by LE weakness  EDEMA:  Bilat ankles, non-pitting  MUSCLE TONE: RLE: Hypotonic  MUSCLE LENGTH: WFL  DTRs:  NT  POSTURE: No Significant postural limitations  LOWER EXTREMITY ROM:     Active  Right Eval Left Eval  Hip flexion    Hip extension    Hip abduction    Hip adduction    Hip internal rotation    Hip external rotation    Knee flexion 115 120  Knee extension 0 0  Ankle dorsiflexion 10 10  Ankle plantarflexion    Ankle inversion    Ankle eversion     (Blank rows = not tested)  LOWER EXTREMITY MMT:    MMT Right Eval Left Eval  Hip flexion 3- 3+  Hip extension    Hip abduction 3- 3  Hip adduction 3 3  Hip internal rotation    Hip external rotation    Knee flexion 3 3+  Knee extension 3- 3  Ankle dorsiflexion 2+ 3+  Ankle plantarflexion    Ankle inversion    Ankle eversion    (Blank rows = not tested)  BED MOBILITY:  Mod A--pt reports using lift-chair recliner for sleeping at this time  TRANSFERS: Assistive device utilized: Environmental consultant - 2 wheeled  Sit to stand: SBA and CGA Stand to sit: SBA and CGA Chair to chair: SBA and CGA Floor:  NT    CURB:  Level of Assistance:  NT due to fear/anxiety from recent falls Assistive device utilized: Environmental consultant - 2 wheeled Curb Comments:   STAIRS: Level of Assistance:  NT due to deficits and fear  GAIT: Gait pattern: Right foot flat, Left foot flat, genu recurvatum- Right, and genu recurvatum- Left Distance walked: 40 ft Assistive device utilized: Environmental consultant - 2 wheeled Level of assistance: CGA and Min A Comments: pt reports she was provided AFO at hospital for right ankle--does not have it with her today  FUNCTIONAL TESTS:  5 times sit to stand: requires use of BUE Timed up and go (TUG): 62 sec w/ RW Static  standing: Fair balance x 10 sec    PATIENT EDUCATION: Education details: trials of standing at kitchen sink with couch cushion to block knees to prevent buckling and to reduce need for knee hyperextension Person educated: Patient and Child(ren) Education method: Explanation Education comprehension: verbalized understanding  HOME EXERCISE PROGRAM: TBD  GOALS: Goals reviewed with patient? Yes  SHORT TERM GOALS: Target date: 01/22/2023    Patient will be independent in HEP to improve functional outcomes Baseline:formal HEP initiated 01/21/2023 Goal status: IN PROGRESS  2.  Demo reduced risk for falls per TUG test time 35 sec  Baseline: 62 sec w/ RW>61 sec 01/21/2023 Goal status: IN PROGRESS    LONG TERM GOALS: Target date: 02/05/2023    Demo supervision ambulation x 75 ft w/ RW with multiple turns to improve safety with household ambulation Baseline: CGA-min A x 40 ft Goal status: INITIAL  2.  Demo improved BLE strength as evidenced by 30 sec 5xSTS with minimal use of hands Baseline: unable Goal status: INITIAL  3.  Improve functional ambulation and safety with ability to negotiate 1-2 steps with HR to improve household navigation Baseline: unable Goal status: INITIAL  4.  Demo improved safety and mobility negotiating up/down curb w/ CGA and AD Baseline: unable Goal status: INITIAL    ASSESSMENT:  CLINICAL IMPRESSION: Skilled PT session today focused initially on lower extremity strengthening and flexibility.   Pt continues to report return through hamstrings and hip flexors in the past week.  She is able to ambulate two bouts in therapy today, and pt demo improved foot clearance as well as less scissoring pattern with gait.  She does have recurvatum in Bilat knees with fatigue with gait.  She will continue to benefit from skilled PT towards goals for improved functional mobility and strength.    OBJECTIVE IMPAIRMENTS: Abnormal gait, decreased activity tolerance,  decreased balance, decreased coordination, decreased knowledge of use of DME, decreased mobility, difficulty walking,  decreased strength, and impaired tone.   ACTIVITY LIMITATIONS: carrying, lifting, bending, standing, stairs, transfers, bed mobility, and locomotion level  PARTICIPATION LIMITATIONS: meal prep, cleaning, laundry, shopping, and community activity  PERSONAL FACTORS: Age, Time since onset of injury/illness/exacerbation, and 1-2 comorbidities: PMH  are also affecting patient's functional outcome.   REHAB POTENTIAL: Good  CLINICAL DECISION MAKING: Evolving/moderate complexity  EVALUATION COMPLEXITY: Moderate  PLAN:  PT FREQUENCY: 2x/week  PT DURATION: 4 weeks  PLANNED INTERVENTIONS: Therapeutic exercises, Therapeutic activity, Neuromuscular re-education, Balance training, Gait training, Patient/Family education, Self Care, Joint mobilization, Stair training, Vestibular training, Canalith repositioning, Orthotic/Fit training, DME instructions, Aquatic Therapy, Cryotherapy, Moist heat, Taping, and Manual therapy  PLAN FOR NEXT SESSION: Review and progress HEP; review home standing strategy at kitchen counter with cushion, try knee bumps to improve quad control. How did long sitting go at home?  Work towards being able to negotiate 1-2 steps (try 2" step in parallel bars) Add long sit to [employer], PT 01/24/23 11:22 AM Phone: [phone] Fax: [phone]  [facility] at [facility] [address] [place], [address] [place], [place] [address] Phone # [phone] Fax # [phone]

## 2023-01-25 ENCOUNTER — Other Ambulatory Visit: Payer: Self-pay

## 2023-01-27 ENCOUNTER — Other Ambulatory Visit: Payer: PPO

## 2023-01-28 ENCOUNTER — Encounter: Payer: Self-pay | Admitting: Physical Therapy

## 2023-01-28 ENCOUNTER — Ambulatory Visit: Payer: PPO | Admitting: Physical Therapy

## 2023-01-28 DIAGNOSIS — M6281 Muscle weakness (generalized): Secondary | ICD-10-CM | POA: Diagnosis not present

## 2023-01-28 DIAGNOSIS — R2681 Unsteadiness on feet: Secondary | ICD-10-CM

## 2023-01-28 DIAGNOSIS — R2689 Other abnormalities of gait and mobility: Secondary | ICD-10-CM

## 2023-01-28 DIAGNOSIS — R29898 Other symptoms and signs involving the musculoskeletal system: Secondary | ICD-10-CM

## 2023-01-28 NOTE — Therapy (Signed)
OUTPATIENT PHYSICAL THERAPY NEURO TREATMENT   Patient Name: Hayley Wu MRN: [id number] DOB:Jan 25, 1951, 72 y.o., female Today's Date: 01/28/2023   PCP: [name] REFERRING PROVIDER: [name], PA-C  END OF SESSION:  PT End of Session - 01/28/23 1317     Visit Number 6    Number of Visits 8    Date for PT Re-Evaluation 02/04/23    Authorization Type [employer]    PT Start Time 1318    PT Stop Time 1359    PT Time Calculation (min) 41 min    Activity Tolerance Patient tolerated treatment well    Behavior During Therapy WFL for tasks assessed/performed              Past Medical History:  Diagnosis Date   Allergy    Contact lens/glasses fitting    Hypertension    Past Surgical History:  Procedure Laterality Date   ABDOMINAL HYSTERECTOMY     BREAST SURGERY  2010   rt lumpectomy-neg   IR IMAGING GUIDED PORT INSERTION  12/18/2022   LAMINECTOMY N/A 09/21/2022   Procedure: THORACIC LAMINECTOMY FOR TUMOR;  Surgeon: Tia Alert, MD;  Location: [facility] OR;  Service: Neurosurgery;  Laterality: N/A;   OPEN REDUCTION INTERNAL FIXATION (ORIF) DISTAL RADIAL FRACTURE Left 11/26/2013   Procedure: OPEN REDUCTION INTERNAL FIXATION (ORIF) LEFT  DISTAL RADIUS ;  Surgeon: Nestor Lewandowsky, MD;  Location: [facility];  Service: Orthopedics;  Laterality: Left;  with block   TUBAL LIGATION     Patient Active Problem List   Diagnosis Date Noted   Port-A-Cath in place 12/30/2022   Encounter for antineoplastic immunotherapy 11/18/2022   Encounter for antineoplastic chemotherapy 11/18/2022   Constipation 11/05/2022   Goals of care, counseling/discussion 10/21/2022   Cancer, metastatic to bone (HCC) 10/04/2022   Adenocarcinoma of right lung, stage 4 (HCC) 10/01/2022   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  09/20/2022   Hypokalemia 09/20/2022   Lung mass 09/20/2022   Hypothyroidism 09/20/2022   Cholelithiasis 09/20/2022   Hypertension 09/23/2011    ONSET DATE: April 2024  REFERRING DIAG:  R29.898 (ICD-10-CM) - Other symptoms and signs involving the musculoskeletal system    THERAPY DIAG:  Muscle weakness (generalized)  Other abnormalities of gait and mobility  Other symptoms and signs involving the musculoskeletal system  Unsteadiness on feet  Rationale for Evaluation and Treatment: Rehabilitation  SUBJECTIVE:  SUBJECTIVE STATEMENT: Sister brought me in today; tired due to getting up so early today.   Pt accompanied by: family member  PERTINENT HISTORY:  Stage IV (T1b, N2, M1 C) non-small cell lung cancer, adenocarcinoma presented with right lower lobe lung nodule in addition to small bilateral pulmonary nodules and right hilar and mediastinal lymphadenopathy in addition to liver and bone metastasis diagnosed in April 2024. 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 She is currently undergoing palliative systemic chemotherapy and immunotherapy.  PAIN:  Are you having pain? Yes: NPRS scale: 0/10 Pain location: mid to low back Pain description: ache Aggravating factors: certain positions/prolonged sitting Relieving factors: change of position  PRECAUTIONS: Fall  RED FLAGS: None   WEIGHT BEARING RESTRICTIONS: No  FALLS: Has patient fallen in last 6 months? Yes. Number of falls 3  LIVING ENVIRONMENT: Lives with: lives with their family Lives in: House/apartment Stairs: No Has following equipment at home: Environmental consultant - 2 wheeled and Wheelchair (manual)  PLOF: Independent  PATIENT GOALS:  improve mobility to return to walking  OBJECTIVE:    TODAY'S TREATMENT: 01/28/2023 Activity Comments  NuStep, Level 4, 4 extremities x 8 minutes Cues to push through R heel for optimal RLE positioning  Stretching for hamstrings gastrocs Review of stretches from last week  Verbally reviewed long sit stretch Pt reports doing at home  Negotiating 2"  step x 3 reps, and 4" step x 4 reps in parallel bars, BUE  Min assist and cues for strong leg leading (LLE) up and weaker leg leading down (RLE)  Standing at counter with [employer] blocking knees, minisquat to upright stand, 2 x 10   Standing at counter, BUE support with lateral wegithshifting 2 x 10; lessening UE support with UE reaching, 2 x 10 Min guard  Gait x 100 ft with RW, R AFO, with min guard Scissoring and recurvatum noted more in last 25 ft of gait with fatigue         Access Code: [email or web address] URL: [email or web address] Date: 01/21/2023 Prepared by: [facility] - Outpatient  Rehab - [facility]  Exercises - Seated Hamstring Stretch  - 1-2 x daily - 7 x weekly - 1 sets - 3 reps - 15-30 sec hold - Seated Gastroc Stretch with Strap  - 1-2 x daily - 7 x weekly - 1 sets - 3 reps - 15-30 sec hold  ------------------------------------------------------------------------------------------- DIAGNOSTIC FINDINGS:  MPRESSION: 1. No acute intracranial abnormality. No evidence for intracranial metastatic disease on this noncontrast examination. 2. Moderately advanced chronic microvascular ischemic disease.  IMPRESSION: 1. Widespread osseous metastatic disease involving the thoracic spine as above. Associated pathologic compression fractures of T6, T7, T8, and T12. Fracture most severe at T7 where there is associated vertebral plana formation. Adjacent epidural extension as above with resultant severe spinal stenosis and cord compression at T7. Emergent neuro surgical consultation recommended. 2. Few scattered probable  pulmonary nodules within the partially visualized right lung, concerning for metastatic disease. 3. Possible cholelithiasis.  COGNITION: Overall cognitive status: Within functional limits for tasks assessed   SENSATION: Decreased light touch BLE  COORDINATION: Impaired by LE weakness  EDEMA:  Bilat ankles, non-pitting  MUSCLE TONE: RLE: Hypotonic  MUSCLE LENGTH: WFL  DTRs:  NT  POSTURE: No Significant postural limitations  LOWER EXTREMITY ROM:     Active  Right Eval Left Eval  Hip flexion    Hip extension    Hip  abduction    Hip adduction    Hip internal rotation    Hip external rotation    Knee flexion 115 120  Knee extension 0 0  Ankle dorsiflexion 10 10  Ankle plantarflexion    Ankle inversion    Ankle eversion     (Blank rows = not tested)  LOWER EXTREMITY MMT:    MMT Right Eval Left Eval  Hip flexion 3- 3+  Hip extension    Hip abduction 3- 3  Hip adduction 3 3  Hip internal rotation    Hip external rotation    Knee flexion 3 3+  Knee extension 3- 3  Ankle dorsiflexion 2+ 3+  Ankle plantarflexion    Ankle inversion    Ankle eversion    (Blank rows = not tested)  BED MOBILITY:  Mod A--pt reports using lift-chair recliner for sleeping at this time  TRANSFERS: Assistive device utilized: Environmental consultant - 2 wheeled  Sit to stand: SBA and CGA Stand to sit: SBA and CGA Chair to chair: SBA and CGA Floor:  NT    CURB:  Level of Assistance:  NT due to fear/anxiety from recent falls Assistive device utilized: Environmental consultant - 2 wheeled Curb Comments:   STAIRS: Level of Assistance:  NT due to deficits and fear  GAIT: Gait pattern: Right foot flat, Left foot flat, genu recurvatum- Right, and genu recurvatum- Left Distance walked: 40 ft Assistive device utilized: Environmental consultant - 2 wheeled Level of assistance: CGA and Min A Comments: pt reports she was provided AFO at hospital for right ankle--does not have it with her today  FUNCTIONAL TESTS:  5 times sit  to stand: requires use of BUE Timed up and go (TUG): 62 sec w/ RW Static standing: Fair balance x 10 sec    PATIENT EDUCATION: Education details: trials of standing at kitchen sink with couch cushion to block knees to prevent buckling and to reduce need for knee hyperextension Person educated: Patient and Child(ren) Education method: Explanation Education comprehension: verbalized understanding  HOME EXERCISE PROGRAM: TBD  GOALS: Goals reviewed with patient? Yes  SHORT TERM GOALS: Target date: 01/22/2023    Patient will be independent in HEP to improve functional outcomes Baseline:formal HEP initiated 01/21/2023 Goal status: IN PROGRESS  2.  Demo reduced risk for falls per TUG test time 35 sec  Baseline: 62 sec w/ RW>61 sec 01/21/2023 Goal status: IN PROGRESS    LONG TERM GOALS: Target date: 02/05/2023    Demo supervision ambulation x 75 ft w/ RW with multiple turns to improve safety with household ambulation Baseline: CGA-min A x 40 ft Goal status: INITIAL  2.  Demo improved BLE strength as evidenced by 30 sec 5xSTS with minimal use of hands Baseline: unable Goal status: INITIAL  3.  Improve functional ambulation and safety with ability to negotiate 1-2 steps with HR to improve household navigation Baseline: unable Goal status: INITIAL  4.  Demo improved safety and mobility negotiating up/down curb w/ CGA and AD Baseline: unable Goal status: INITIAL    ASSESSMENT:  CLINICAL IMPRESSION: Skilled PT session today focused on lower extremity strengthening, with use of NuStep as well as with standing at counter.  Initiated stair/curb practice in parallel bars with 2" and 4" step with BUE support.  Pt able to perform with min assist, LLE leading up and RLE leading down with recurvatum noted L knee, but no buckling or overt LOB (which pt has reported happening in the past).  She does have question about  getting up from the floor in the event of a fall, which we will need  to practice parts of this; pt will likely need assistance at this time due to decreased BLE strength.  She will continue to benefit from skilled PT towards goals for improved functional mobility and strength.    OBJECTIVE IMPAIRMENTS: Abnormal gait, decreased activity tolerance, decreased balance, decreased coordination, decreased knowledge of use of DME, decreased mobility, difficulty walking, decreased strength, and impaired tone.   ACTIVITY LIMITATIONS: carrying, lifting, bending, standing, stairs, transfers, bed mobility, and locomotion level  PARTICIPATION LIMITATIONS: meal prep, cleaning, laundry, shopping, and community activity  PERSONAL FACTORS: Age, Time since onset of injury/illness/exacerbation, and 1-2 comorbidities: PMH  are also affecting patient's functional outcome.   REHAB POTENTIAL: Good  CLINICAL DECISION MAKING: Evolving/moderate complexity  EVALUATION COMPLEXITY: Moderate  PLAN:  PT FREQUENCY: 2x/week  PT DURATION: 4 weeks  PLANNED INTERVENTIONS: Therapeutic exercises, Therapeutic activity, Neuromuscular re-education, Balance training, Gait training, Patient/Family education, Self Care, Joint mobilization, Stair training, Vestibular training, Canalith repositioning, Orthotic/Fit training, DME instructions, Aquatic Therapy, Cryotherapy, Moist heat, Taping, and Manual therapy  PLAN FOR NEXT SESSION: Progress HEP as needed; review home standing activities at kitchen counter (pt doesn't necessarily need cushion at this time).  Work towards being able to negotiate 1-2 steps (try 4" step at stairs).  Gait with turns.  Begin discussing LTGs/POC, as pt's LTGs are due next week.  Lonia Blood, PT 01/28/23 2:09 PM Phone: [phone] Fax: [phone]  [facility] at [facility] [address] [place], [address] [place], [place] [address] Phone # [phone] Fax # [phone]

## 2023-01-30 ENCOUNTER — Ambulatory Visit: Payer: PPO

## 2023-01-30 DIAGNOSIS — R262 Difficulty in walking, not elsewhere classified: Secondary | ICD-10-CM

## 2023-01-30 DIAGNOSIS — M6281 Muscle weakness (generalized): Secondary | ICD-10-CM

## 2023-01-30 DIAGNOSIS — R2681 Unsteadiness on feet: Secondary | ICD-10-CM

## 2023-01-30 DIAGNOSIS — R2689 Other abnormalities of gait and mobility: Secondary | ICD-10-CM

## 2023-01-30 DIAGNOSIS — R29898 Other symptoms and signs involving the musculoskeletal system: Secondary | ICD-10-CM

## 2023-01-30 NOTE — Therapy (Signed)
OUTPATIENT PHYSICAL THERAPY NEURO TREATMENT   Patient Name: Hayley Wu MRN: [id number] DOB:03-27-1951, 72 y.o., female Today's Date: 01/30/2023   PCP: [name] REFERRING PROVIDER: [name], PA-C  END OF SESSION:  PT End of Session - 01/30/23 1402     Visit Number 7    Number of Visits 8    Date for PT Re-Evaluation 02/04/23    Authorization Type [employer]    PT Start Time 1402    PT Stop Time 1445    PT Time Calculation (min) 43 min    Activity Tolerance Patient tolerated treatment well    Behavior During Therapy WFL for tasks assessed/performed              Past Medical History:  Diagnosis Date   Allergy    Contact lens/glasses fitting    Hypertension    Past Surgical History:  Procedure Laterality Date   ABDOMINAL HYSTERECTOMY     BREAST SURGERY  2010   rt lumpectomy-neg   IR IMAGING GUIDED PORT INSERTION  12/18/2022   LAMINECTOMY N/A 09/21/2022   Procedure: THORACIC LAMINECTOMY FOR TUMOR;  Surgeon: Tia Alert, MD;  Location: [facility];  Service: Neurosurgery;  Laterality: N/A;   OPEN REDUCTION INTERNAL FIXATION (ORIF) DISTAL RADIAL FRACTURE Left 11/26/2013   Procedure: OPEN REDUCTION INTERNAL FIXATION (ORIF) LEFT  DISTAL RADIUS ;  Surgeon: Nestor Lewandowsky, MD;  Location: [facility];  Service: Orthopedics;  Laterality: Left;  with block   TUBAL LIGATION     Patient Active Problem List   Diagnosis Date Noted   Port-A-Cath in place 12/30/2022   Encounter for antineoplastic immunotherapy 11/18/2022   Encounter for antineoplastic chemotherapy 11/18/2022   Constipation 11/05/2022   Goals of care, counseling/discussion 10/21/2022   Cancer, metastatic to bone (HCC) 10/04/2022   Adenocarcinoma of right lung, stage 4 (HCC) 10/01/2022   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  09/20/2022   Hypokalemia 09/20/2022   Lung mass 09/20/2022   Hypothyroidism 09/20/2022   Cholelithiasis 09/20/2022   Hypertension 09/23/2011    ONSET DATE: April 2024  REFERRING DIAG:  R29.898 (ICD-10-CM) - Other symptoms and signs involving the musculoskeletal system    THERAPY DIAG:  Muscle weakness (generalized)  Other abnormalities of gait and mobility  Other symptoms and signs involving the musculoskeletal system  Unsteadiness on feet  Difficulty in walking, not elsewhere classified  Rationale for Evaluation and Treatment: Rehabilitation  SUBJECTIVE:  SUBJECTIVE STATEMENT: Doing pretty good  Pt accompanied by: family member  PERTINENT HISTORY:  Stage IV (T1b, N2, M1 C) non-small cell lung cancer, adenocarcinoma presented with right lower lobe lung nodule in addition to small bilateral pulmonary nodules and right hilar and mediastinal lymphadenopathy in addition to liver and bone metastasis diagnosed in April 2024. 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 She is currently undergoing palliative systemic chemotherapy and immunotherapy.  PAIN:  Are you having pain? Yes: NPRS scale: 0/10 Pain location: mid to low back Pain description: ache Aggravating factors: certain positions/prolonged sitting Relieving factors: change of position  PRECAUTIONS: Fall  RED FLAGS: None   WEIGHT BEARING RESTRICTIONS: No  FALLS: Has patient fallen in last 6 months? Yes. Number of falls 3  LIVING ENVIRONMENT: Lives with: lives with their family Lives in: House/apartment Stairs: No Has following equipment at home: Dan Humphreys - 2 wheeled and Wheelchair (manual)  PLOF: Independent  PATIENT GOALS:  improve mobility to return to walking  OBJECTIVE:   TODAY'S TREATMENT: 01/30/23 Activity Comments  Stair negotiation CGA-min A for negotiation w/ BHR and cues in sequence. Best performance with ascend with left descend with RLE leading  LAQ 3x10 10#, cues for full ROM  Seated hip flexion 3x10 5#, partial ROM  Seated hip flexion eccentric 2x5 Assist for concentric, 3 sec iso hold, slow release  Seated hamstring curls 3x10 Single band RLE, double band LLE   Seated clamshells 3x10 Manual resistance  Gait training W/ RW and PLS AFO, poor fit  NU-step resistance intervals 30 sec 1:1     TODAY'S TREATMENT: 01/28/2023 Activity Comments  NuStep, Level 4, 4 extremities x 8 minutes Cues to push through R heel for optimal RLE positioning  Stretching for hamstrings gastrocs Review of stretches from last week  Verbally reviewed long sit stretch Pt reports doing at home  Negotiating 2"  step x 3 reps, and 4" step x 4 reps in parallel bars, BUE  Min assist and cues for strong leg leading (LLE) up and weaker leg leading down (RLE)  Standing at counter with Airex blocking knees, minisquat to upright stand, 2 x 10   Standing at counter, BUE support with lateral wegithshifting 2 x 10; lessening UE support with UE reaching, 2 x 10 Min guard  Gait x 100 ft with RW, R AFO, with min guard Scissoring and recurvatum noted more in last 25 ft of gait with fatigue         Access Code: [email or web address] URL: [email or web address] Date: 01/21/2023 Prepared by: [facility] - Outpatient  Rehab - [facility]  Exercises - Seated Hamstring Stretch  - 1-2 x daily - 7 x weekly - 1 sets - 3 reps - 15-30 sec hold - Seated Gastroc Stretch with Strap  - 1-2 x daily - 7 x weekly - 1 sets - 3 reps - 15-30 sec hold  ------------------------------------------------------------------------------------------- DIAGNOSTIC FINDINGS:  MPRESSION: 1. No acute intracranial abnormality. No evidence for  intracranial metastatic disease on this noncontrast examination. 2. Moderately advanced chronic microvascular ischemic disease.  IMPRESSION: 1. Widespread osseous metastatic disease involving the thoracic spine as above. Associated pathologic compression fractures of T6, T7, T8, and T12. Fracture most severe at T7 where there is associated vertebral plana formation. Adjacent epidural extension as above with resultant severe spinal stenosis and cord compression at T7. Emergent neuro surgical consultation recommended. 2. Few scattered probable pulmonary nodules within the  partially visualized right lung, concerning for metastatic disease. 3. Possible cholelithiasis.  COGNITION: Overall cognitive status: Within functional limits for tasks assessed   SENSATION: Decreased light touch BLE  COORDINATION: Impaired by LE weakness  EDEMA:  Bilat ankles, non-pitting  MUSCLE TONE: RLE: Hypotonic  MUSCLE LENGTH: WFL  DTRs:  NT  POSTURE: No Significant postural limitations  LOWER EXTREMITY ROM:     Active  Right Eval Left Eval  Hip flexion    Hip extension    Hip abduction    Hip adduction    Hip internal rotation    Hip external rotation    Knee flexion 115 120  Knee extension 0 0  Ankle dorsiflexion 10 10  Ankle plantarflexion    Ankle inversion    Ankle eversion     (Blank rows = not tested)  LOWER EXTREMITY MMT:    MMT Right Eval Left Eval  Hip flexion 3- 3+  Hip extension    Hip abduction 3- 3  Hip adduction 3 3  Hip internal rotation    Hip external rotation    Knee flexion 3 3+  Knee extension 3- 3  Ankle dorsiflexion 2+ 3+  Ankle plantarflexion    Ankle inversion    Ankle eversion    (Blank rows = not tested)  BED MOBILITY:  Mod A--pt reports using lift-chair recliner for sleeping at this time  TRANSFERS: Assistive device utilized: Environmental consultant - 2 wheeled  Sit to stand: SBA and CGA Stand to sit: SBA and CGA Chair to chair: SBA and CGA Floor:   NT    CURB:  Level of Assistance:  NT due to fear/anxiety from recent falls Assistive device utilized: Environmental consultant - 2 wheeled Curb Comments:   STAIRS: Level of Assistance:  NT due to deficits and fear  GAIT: Gait pattern: Right foot flat, Left foot flat, genu recurvatum- Right, and genu recurvatum- Left Distance walked: 40 ft Assistive device utilized: Environmental consultant - 2 wheeled Level of assistance: CGA and Min A Comments: pt reports she was provided AFO at hospital for right ankle--does not have it with her today  FUNCTIONAL TESTS:  5 times sit to stand: requires use of BUE Timed up and go (TUG): 62 sec w/ RW Static standing: Fair balance x 10 sec    PATIENT EDUCATION: Education details: trials of standing at kitchen sink with couch cushion to block knees to prevent buckling and to reduce need for knee hyperextension Person educated: Patient and Child(ren) Education method: Explanation Education comprehension: verbalized understanding  HOME EXERCISE PROGRAM: TBD  GOALS: Goals reviewed with patient? Yes  SHORT TERM GOALS: Target date: 01/22/2023    Patient will be independent in HEP to improve functional outcomes Baseline:formal HEP initiated 01/21/2023 Goal status: IN PROGRESS  2.  Demo reduced risk for falls per TUG test time 35 sec  Baseline: 62 sec w/ RW>61 sec 01/21/2023 Goal status: IN PROGRESS    LONG TERM GOALS: Target date: 02/05/2023    Demo supervision ambulation x 75 ft w/ RW with multiple turns to improve safety with household ambulation Baseline: CGA-min A x 40 ft Goal status: INITIAL  2.  Demo improved BLE strength as evidenced by 30 sec 5xSTS with minimal use of hands Baseline: unable Goal status: INITIAL  3.  Improve functional ambulation and safety with ability to negotiate 1-2 steps with HR to improve household navigation Baseline: unable Goal status: INITIAL  4.  Demo improved safety and mobility negotiating up/down curb w/ CGA and AD Baseline:  unable Goal status: INITIAL    ASSESSMENT:  CLINICAL IMPRESSION: Marked improvement in BLE revealed by manual muscle testing today with exception of right ankle dorsiflexion with persisting foot drop.  Able to perform stair ambulation with CGA-min A and bilateral HR with best sequence being ascending with LLE and descending with RLE in the lead.  Focus of session on improving BLE strength with heavy resistance for muscular power/recruitment.  OBJECTIVE IMPAIRMENTS: Abnormal gait, decreased activity tolerance, decreased balance, decreased coordination, decreased knowledge of use of DME, decreased mobility, difficulty walking, decreased strength, and impaired tone.   ACTIVITY LIMITATIONS: carrying, lifting, bending, standing, stairs, transfers, bed mobility, and locomotion level  PARTICIPATION LIMITATIONS: meal prep, cleaning, laundry, shopping, and community activity  PERSONAL FACTORS: Age, Time since onset of injury/illness/exacerbation, and 1-2 comorbidities: PMH  are also affecting patient's functional outcome.   REHAB POTENTIAL: Good  CLINICAL DECISION MAKING: Evolving/moderate complexity  EVALUATION COMPLEXITY: Moderate  PLAN:  PT FREQUENCY: 2x/week  PT DURATION: 4 weeks  PLANNED INTERVENTIONS: Therapeutic exercises, Therapeutic activity, Neuromuscular re-education, Balance training, Gait training, Patient/Family education, Self Care, Joint mobilization, Stair training, Vestibular training, Canalith repositioning, Orthotic/Fit training, DME instructions, Aquatic Therapy, Cryotherapy, Moist heat, Taping, and Manual therapy  PLAN FOR NEXT SESSION: Progress HEP as needed; review home standing activities at kitchen counter (pt doesn't necessarily need cushion at this time).  Work towards being able to negotiate 1-2 steps (try 4" step at stairs).  Gait with turns.  Begin discussing LTGs/POC, as pt's LTGs are due next week.  3:54 PM, 01/30/23 M. Shary Decamp, PT, DPT Physical  Therapist- [place] Office Number: [phone]

## 2023-02-03 ENCOUNTER — Other Ambulatory Visit: Payer: PPO

## 2023-02-03 NOTE — Therapy (Signed)
OUTPATIENT PHYSICAL THERAPY NEURO TREATMENT   Patient Name: Hayley Wu MRN: [id number] DOB:12-May-1951, 72 y.o., female Today's Date: 02/03/2023   PCP: [name] REFERRING PROVIDER: [name], PA-C  END OF SESSION:     Past Medical History:  Diagnosis Date   Allergy    Contact lens/glasses fitting    Hypertension    Past Surgical History:  Procedure Laterality Date   ABDOMINAL HYSTERECTOMY     BREAST SURGERY  2010   rt lumpectomy-neg   IR IMAGING GUIDED PORT INSERTION  12/18/2022   LAMINECTOMY N/A 09/21/2022   Procedure: THORACIC LAMINECTOMY FOR TUMOR;  Surgeon: Tia Alert, MD;  Location: [facility] OR;  Service: Neurosurgery;  Laterality: N/A;   OPEN REDUCTION INTERNAL FIXATION (ORIF) DISTAL RADIAL FRACTURE Left 11/26/2013   Procedure: OPEN REDUCTION INTERNAL FIXATION (ORIF) LEFT  DISTAL RADIUS ;  Surgeon: Nestor Lewandowsky, MD;  Location: [facility];  Service: Orthopedics;  Laterality: Left;  with block   TUBAL LIGATION     Patient Active Problem List   Diagnosis Date Noted   Port-A-Cath in place 12/30/2022   Encounter for antineoplastic immunotherapy 11/18/2022   Encounter for antineoplastic chemotherapy 11/18/2022   Constipation 11/05/2022   Goals of care, counseling/discussion 10/21/2022   Cancer, metastatic to bone (HCC) 10/04/2022   Adenocarcinoma of right lung, stage 4 (HCC) 10/01/2022   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 09/20/2022   Hypokalemia 09/20/2022   Lung mass 09/20/2022   Hypothyroidism 09/20/2022   Cholelithiasis 09/20/2022   Hypertension 09/23/2011    ONSET DATE: April 2024  REFERRING DIAG:  R29.898 (ICD-10-CM) - Other symptoms and signs involving the musculoskeletal system    THERAPY DIAG:  No diagnosis found.  Rationale for Evaluation and Treatment:  Rehabilitation  SUBJECTIVE:                                                                                                                                                                                             SUBJECTIVE STATEMENT: Doing pretty good  Pt accompanied by: family member  PERTINENT HISTORY:  Stage IV (T1b, N2, M1 C) non-small cell lung cancer, adenocarcinoma presented with right lower lobe lung nodule in addition to small bilateral pulmonary nodules and right hilar and mediastinal lymphadenopathy in addition to liver and bone metastasis diagnosed in April 2024. 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 She is currently undergoing  palliative systemic chemotherapy and immunotherapy.  PAIN:  Are you having pain? Yes: NPRS scale: 0/10 Pain location: mid to low back Pain description: ache Aggravating factors: certain positions/prolonged sitting Relieving factors: change of position  PRECAUTIONS: Fall  RED FLAGS: None   WEIGHT BEARING RESTRICTIONS: No  FALLS: Has patient fallen in last 6 months? Yes. Number of falls 3  LIVING ENVIRONMENT: Lives with: lives with their family Lives in: House/apartment Stairs: No Has following equipment at home: Dan Humphreys - 2 wheeled and Wheelchair (manual)  PLOF: Independent  PATIENT GOALS: improve mobility to return to walking  OBJECTIVE:      TODAY'S TREATMENT: 02/04/23 Activity Comments                       TODAY'S TREATMENT: 01/30/23 Activity Comments  Stair negotiation CGA-min A for negotiation w/ BHR and cues in sequence. Best performance with ascend with left descend with RLE leading  LAQ 3x10 10#, cues for full ROM  Seated hip flexion 3x10 5#, partial ROM  Seated hip flexion eccentric 2x5 Assist for concentric, 3 sec iso hold, slow release  Seated hamstring curls 3x10 Single band RLE, double band LLE   Seated  clamshells 3x10 Manual resistance  Gait training W/ RW and PLS AFO, poor fit  NU-step resistance intervals 30 sec 1:1     TODAY'S TREATMENT: 01/28/2023 Activity Comments  NuStep, Level 4, 4 extremities x 8 minutes Cues to push through R heel for optimal RLE positioning  Stretching for hamstrings gastrocs Review of stretches from last week  Verbally reviewed long sit stretch Pt reports doing at home  Negotiating 2"  step x 3 reps, and 4" step x 4 reps in parallel bars, BUE  Min assist and cues for strong leg leading (LLE) up and weaker leg leading down (RLE)  Standing at counter with Airex blocking knees, minisquat to upright stand, 2 x 10   Standing at counter, BUE support with lateral wegithshifting 2 x 10; lessening UE support with UE reaching, 2 x 10 Min guard  Gait x 100 ft with RW, R AFO, with min guard Scissoring and recurvatum noted more in last 25 ft of gait with fatigue         Access Code: [email or web address] URL: [email or web address] Date: 01/21/2023 Prepared by: [facility] - Outpatient  Rehab - [facility]  Exercises - Seated Hamstring Stretch  - 1-2 x daily - 7 x weekly - 1 sets - 3 reps - 15-30 sec hold - Seated Gastroc Stretch with Strap  - 1-2 x daily - 7 x weekly - 1 sets - 3 reps - 15-30 sec hold  ------------------------------------------------------------------------------------------- DIAGNOSTIC FINDINGS:  MPRESSION: 1. No acute intracranial abnormality. No evidence for intracranial metastatic disease on this noncontrast examination. 2. Moderately advanced chronic microvascular ischemic disease.  IMPRESSION: 1. Widespread osseous metastatic disease involving the thoracic spine as above. Associated pathologic compression fractures of T6, T7, T8, and T12. Fracture most severe at T7 where there is associated vertebral plana formation. Adjacent epidural extension as above with resultant severe spinal stenosis and cord compression at T7. Emergent neuro  surgical consultation recommended. 2. Few scattered probable pulmonary nodules within the partially visualized right lung, concerning for metastatic disease. 3. Possible cholelithiasis.  COGNITION: Overall cognitive status: Within functional limits for tasks assessed   SENSATION: Decreased light touch BLE  COORDINATION: Impaired by LE weakness  EDEMA:  Bilat ankles, non-pitting  MUSCLE TONE: RLE: Hypotonic  MUSCLE LENGTH: WFL  DTRs:  NT  POSTURE: No Significant postural limitations  LOWER EXTREMITY ROM:     Active  Right Eval Left Eval  Hip flexion    Hip extension    Hip abduction    Hip adduction    Hip internal rotation    Hip external rotation    Knee flexion 115 120  Knee extension 0 0  Ankle dorsiflexion 10 10  Ankle plantarflexion    Ankle inversion    Ankle eversion     (Blank rows = not tested)  LOWER EXTREMITY MMT:    MMT Right Eval Left Eval  Hip flexion 3- 3+  Hip extension    Hip abduction 3- 3  Hip adduction 3 3  Hip internal rotation    Hip external rotation    Knee flexion 3 3+  Knee extension 3- 3  Ankle dorsiflexion 2+ 3+  Ankle plantarflexion    Ankle inversion    Ankle eversion    (Blank rows = not tested)  BED MOBILITY:  Mod A--pt reports using lift-chair recliner for sleeping at this time  TRANSFERS: Assistive device utilized: Environmental consultant - 2 wheeled  Sit to stand: SBA and CGA Stand to sit: SBA and CGA Chair to chair: SBA and CGA Floor:  NT    CURB:  Level of Assistance:  NT due to fear/anxiety from recent falls Assistive device utilized: Environmental consultant - 2 wheeled Curb Comments:   STAIRS: Level of Assistance:  NT due to deficits and fear  GAIT: Gait pattern: Right foot flat, Left foot flat, genu recurvatum- Right, and genu recurvatum- Left Distance walked: 40 ft Assistive device utilized: Environmental consultant - 2 wheeled Level of assistance: CGA and Min A Comments: pt reports she was provided AFO at hospital for right ankle--does  not have it with her today  FUNCTIONAL TESTS:  5 times sit to stand: requires use of BUE Timed up and go (TUG): 62 sec w/ RW Static standing: Fair balance x 10 sec    PATIENT EDUCATION: Education details: trials of standing at kitchen sink with couch cushion to block knees to prevent buckling and to reduce need for knee hyperextension Person educated: Patient and Child(ren) Education method: Explanation Education comprehension: verbalized understanding  HOME EXERCISE PROGRAM: TBD  GOALS: Goals reviewed with patient? Yes  SHORT TERM GOALS: Target date: 01/22/2023    Patient will be independent in HEP to improve functional outcomes Baseline:formal HEP initiated 01/21/2023 Goal status: IN PROGRESS  2.  Demo reduced risk for falls per TUG test time 35 sec  Baseline: 62 sec w/ RW>61 sec 01/21/2023 Goal status: IN PROGRESS    LONG TERM GOALS: Target date: 02/05/2023    Demo supervision ambulation x 75 ft w/ RW with multiple turns to improve safety with household ambulation Baseline: CGA-min A x 40 ft Goal status: INITIAL  2.  Demo improved BLE strength as evidenced by 30 sec 5xSTS with minimal use of hands Baseline: unable Goal status: INITIAL  3.  Improve functional ambulation and safety with ability to negotiate 1-2 steps with HR to improve household navigation Baseline: unable Goal status: INITIAL  4.  Demo improved safety and mobility negotiating up/down curb w/ CGA and AD Baseline: unable Goal status: INITIAL    ASSESSMENT:  CLINICAL IMPRESSION: Marked improvement in BLE revealed by manual muscle testing today with exception of right ankle dorsiflexion with persisting foot drop.  Able to perform stair ambulation with CGA-min A and bilateral HR with best sequence being ascending with LLE and descending with RLE in the  lead.  Focus of session on improving BLE strength with heavy resistance for muscular power/recruitment.  OBJECTIVE IMPAIRMENTS: Abnormal gait,  decreased activity tolerance, decreased balance, decreased coordination, decreased knowledge of use of DME, decreased mobility, difficulty walking, decreased strength, and impaired tone.   ACTIVITY LIMITATIONS: carrying, lifting, bending, standing, stairs, transfers, bed mobility, and locomotion level  PARTICIPATION LIMITATIONS: meal prep, cleaning, laundry, shopping, and community activity  PERSONAL FACTORS: Age, Time since onset of injury/illness/exacerbation, and 1-2 comorbidities: PMH  are also affecting patient's functional outcome.   REHAB POTENTIAL: Good  CLINICAL DECISION MAKING: Evolving/moderate complexity  EVALUATION COMPLEXITY: Moderate  PLAN:  PT FREQUENCY: 2x/week  PT DURATION: 4 weeks  PLANNED INTERVENTIONS: Therapeutic exercises, Therapeutic activity, Neuromuscular re-education, Balance training, Gait training, Patient/Family education, Self Care, Joint mobilization, Stair training, Vestibular training, Canalith repositioning, Orthotic/Fit training, DME instructions, Aquatic Therapy, Cryotherapy, Moist heat, Taping, and Manual therapy  PLAN FOR NEXT SESSION: Progress HEP as needed; review home standing activities at kitchen counter (pt doesn't necessarily need cushion at this time).  Work towards being able to negotiate 1-2 steps (try 4" step at stairs).  Gait with turns.  Begin discussing LTGs/POC, as pt's LTGs are due next week.

## 2023-02-04 ENCOUNTER — Ambulatory Visit: Payer: PPO | Admitting: Physical Therapy

## 2023-02-04 ENCOUNTER — Encounter: Payer: Self-pay | Admitting: Physical Therapy

## 2023-02-04 DIAGNOSIS — R2689 Other abnormalities of gait and mobility: Secondary | ICD-10-CM

## 2023-02-04 DIAGNOSIS — R262 Difficulty in walking, not elsewhere classified: Secondary | ICD-10-CM

## 2023-02-04 DIAGNOSIS — M6281 Muscle weakness (generalized): Secondary | ICD-10-CM

## 2023-02-04 DIAGNOSIS — R2681 Unsteadiness on feet: Secondary | ICD-10-CM

## 2023-02-04 DIAGNOSIS — R29898 Other symptoms and signs involving the musculoskeletal system: Secondary | ICD-10-CM

## 2023-02-06 ENCOUNTER — Ambulatory Visit: Payer: PPO

## 2023-02-06 DIAGNOSIS — R262 Difficulty in walking, not elsewhere classified: Secondary | ICD-10-CM

## 2023-02-06 DIAGNOSIS — M6281 Muscle weakness (generalized): Secondary | ICD-10-CM

## 2023-02-06 DIAGNOSIS — R2689 Other abnormalities of gait and mobility: Secondary | ICD-10-CM

## 2023-02-06 DIAGNOSIS — R29898 Other symptoms and signs involving the musculoskeletal system: Secondary | ICD-10-CM

## 2023-02-06 DIAGNOSIS — R2681 Unsteadiness on feet: Secondary | ICD-10-CM

## 2023-02-06 NOTE — Therapy (Signed)
OUTPATIENT PHYSICAL THERAPY NEURO PROGRESS NOTE   Patient Name: Hayley Wu MRN: [id number] DOB:Aug 24, 1950, 72 y.o., female Today's Date: 02/06/2023   PCP: [name], PA-C REFERRING PROVIDER: [name], PA-C  END OF SESSION:  PT End of Session - 02/06/23 1401     Visit Number 9    Number of Visits 16    Date for PT Re-Evaluation 03/04/23    Authorization Type [employer]    PT Start Time 1400    PT Stop Time 1445    PT Time Calculation (min) 45 min    Equipment Utilized During Treatment Gait belt    Activity Tolerance Patient tolerated treatment well    Behavior During Therapy WFL for tasks assessed/performed               Past Medical History:  Diagnosis Date   Allergy    Contact lens/glasses fitting    Hypertension    Past Surgical History:  Procedure Laterality Date   ABDOMINAL HYSTERECTOMY     BREAST SURGERY  2010   rt lumpectomy-neg   IR IMAGING GUIDED PORT INSERTION  12/18/2022   LAMINECTOMY N/A 09/21/2022   Procedure: THORACIC LAMINECTOMY FOR TUMOR;  Surgeon: Tia Alert, MD;  Location: [facility] OR;  Service: Neurosurgery;  Laterality: N/A;   OPEN REDUCTION INTERNAL FIXATION (ORIF) DISTAL RADIAL FRACTURE Left 11/26/2013   Procedure: OPEN REDUCTION INTERNAL FIXATION (ORIF) LEFT  DISTAL RADIUS ;  Surgeon: Nestor Lewandowsky, MD;  Location: [facility];  Service: Orthopedics;  Laterality: Left;  with block   TUBAL LIGATION     Patient Active Problem List   Diagnosis Date Noted   Port-A-Cath in place 12/30/2022   Encounter for antineoplastic immunotherapy 11/18/2022   Encounter for antineoplastic chemotherapy 11/18/2022   Constipation 11/05/2022   Goals of care, counseling/discussion 10/21/2022   Cancer, metastatic to bone (HCC) 10/04/2022   Adenocarcinoma of right lung, stage 4 (HCC) 10/01/2022   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 09/20/2022   Hypokalemia 09/20/2022   Lung mass 09/20/2022   Hypothyroidism 09/20/2022   Cholelithiasis 09/20/2022   Hypertension 09/23/2011    ONSET DATE: April 2024  REFERRING DIAG:  R29.898 (ICD-10-CM) - Other symptoms and signs involving the musculoskeletal system    THERAPY DIAG:  Muscle weakness (generalized)  Other abnormalities of gait and mobility  Other symptoms and signs involving the musculoskeletal system  Unsteadiness on feet  Difficulty in walking, not elsewhere classified  Rationale for Evaluation and Treatment: Rehabilitation  SUBJECTIVE:  SUBJECTIVE STATEMENT: "Tired." Just moved back home- back feels sore from bending. Unable to get into [place] due to high running board  Pt accompanied by: family member  PERTINENT HISTORY:  Stage IV (T1b, N2, M1 C) non-small cell lung cancer, adenocarcinoma presented with right lower lobe lung nodule in addition to small bilateral pulmonary nodules and right hilar and mediastinal lymphadenopathy in addition to liver and bone metastasis diagnosed in April 2024. 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 She is currently undergoing palliative systemic chemotherapy and immunotherapy.  PAIN:  Are you having pain? Yes: NPRS scale: 3-4/10 Pain location: mid to low back Pain description: sore Aggravating factors: certain positions/prolonged sitting Relieving factors: change of position  PRECAUTIONS: Fall  RED FLAGS: None   WEIGHT BEARING RESTRICTIONS: No  FALLS: Has patient fallen in last 6 months? Yes. Number of falls 3  LIVING ENVIRONMENT: Lives with: lives with their  family Lives in: House/apartment Stairs: No Has following equipment at home: Environmental consultant - 2 wheeled and Wheelchair (manual)  PLOF: Independent  PATIENT GOALS: improve mobility to return to walking  OBJECTIVE:   TODAY'S TREATMENT: 02/06/23 Activity Comments  Lateral step-ups 1x5 On 6" step to simulate stepping onto car running board  LAQ 3x10 4#  Hamstring curls 3x15 Red band  Seated clamshells 3x15 Red loop  Hip add iso 3x15   Static standing -2x30 sec unsupported -finger tips on rails performing TKE 3x10 red band -standing hip flexion 3x10, blocking knee to prevent buckling  NU-step level 9 x 5 min -30 sec work intervals, 15 sec rest       TODAY'S TREATMENT: 02/04/23 Activity Comments  TUG  53.9 sec with RW  5xSTS  Heavy reliance on UEs and not able to let go of armrests; 12.18 sec  2nd trial with RW: 21.31 sec   Stairs 2x R LE dominant pattern with CGA-min A and B handrails   Gait with RW 89 feet with PT following behind with w/c       PATIENT EDUCATION: Education details: discussion on progress towards goals and remaining impairments, HEP update  Person educated: Patient Education method: Explanation, Demonstration, Tactile cues, Verbal cues, and Handouts Education comprehension: verbalized understanding and returned demonstration   HOME EXERCISE PROGRAM Last updated: 02/04/23 Access Code: [email or web address] URL: [email or web address] Date: 02/04/2023 Prepared by: [facility] - Outpatient  Rehab - [facility]  Program Notes perform standing activities at counter top and chair behihnd for safety. start with family supervision first.   Exercises - Seated Hamstring Stretch  - 1-2 x daily - 7 x weekly - 1 sets - 3 reps - 15-30 sec hold - Seated Gastroc Stretch with Strap  - 1-2 x daily - 7 x weekly - 1 sets - 3 reps - 15-30 sec hold - Standing March with Counter Support  - 1 x daily - 5 x weekly - 2 sets - 10 reps - Mini Squat with Counter Support  - 1 x daily  - 5 x weekly - 2 sets - 10 reps - Seated Long Arc Quad  - 1 x daily - 5 x weekly - 2 sets - 10 reps  ------------------------------------------------------------------------------------------- DIAGNOSTIC FINDINGS:  MPRESSION: 1. No acute intracranial abnormality. No evidence for intracranial metastatic disease on this noncontrast examination. 2. Moderately advanced chronic microvascular ischemic disease.  IMPRESSION: 1. Widespread osseous metastatic disease involving the thoracic spine as above. Associated pathologic compression fractures of  T6, T7, T8, and T12. Fracture most severe at T7 where there is associated vertebral plana formation. Adjacent epidural extension as above with resultant severe spinal stenosis and cord compression at T7. Emergent neuro surgical consultation recommended. 2. Few scattered probable pulmonary nodules within the partially visualized right lung, concerning for metastatic disease. 3. Possible cholelithiasis.  COGNITION: Overall cognitive status: Within functional limits for tasks assessed   SENSATION: Decreased light touch BLE  COORDINATION: Impaired by LE weakness  EDEMA:  Bilat ankles, non-pitting  MUSCLE TONE: RLE: Hypotonic  MUSCLE LENGTH: WFL  DTRs:  NT  POSTURE: No Significant postural limitations  LOWER EXTREMITY ROM:     Active  Right Eval Left Eval  Hip flexion    Hip extension    Hip abduction    Hip adduction    Hip internal rotation    Hip external rotation    Knee flexion 115 120  Knee extension 0 0  Ankle dorsiflexion 10 10  Ankle plantarflexion    Ankle inversion    Ankle eversion     (Blank rows = not tested)  LOWER EXTREMITY MMT:    MMT Right Eval Left Eval  Hip flexion 3- 3+  Hip extension    Hip abduction 3- 3  Hip adduction 3 3  Hip internal rotation    Hip external rotation    Knee flexion 3 3+  Knee extension 3- 3  Ankle dorsiflexion 2+ 3+  Ankle plantarflexion    Ankle inversion     Ankle eversion    (Blank rows = not tested)  BED MOBILITY:  Mod A--pt reports using lift-chair recliner for sleeping at this time  TRANSFERS: Assistive device utilized: Environmental consultant - 2 wheeled  Sit to stand: SBA and CGA Stand to sit: SBA and CGA Chair to chair: SBA and CGA Floor:  NT    CURB:  Level of Assistance:  NT due to fear/anxiety from recent falls Assistive device utilized: Environmental consultant - 2 wheeled Curb Comments:   STAIRS: Level of Assistance:  NT due to deficits and fear  GAIT: Gait pattern: Right foot flat, Left foot flat, genu recurvatum- Right, and genu recurvatum- Left Distance walked: 40 ft Assistive device utilized: Environmental consultant - 2 wheeled Level of assistance: CGA and Min A Comments: pt reports she was provided AFO at hospital for right ankle--does not have it with her today  FUNCTIONAL TESTS:  5 times sit to stand: requires use of BUE Timed up and go (TUG): 62 sec w/ RW Static standing: Fair balance x 10 sec    PATIENT EDUCATION: Education details: trials of standing at kitchen sink with couch cushion to block knees to prevent buckling and to reduce need for knee hyperextension Person educated: Patient and Child(ren) Education method: Explanation Education comprehension: verbalized understanding  HOME EXERCISE PROGRAM: TBD  GOALS: Goals reviewed with patient? Yes  SHORT TERM GOALS: Target date: 01/22/2023    Patient will be independent in HEP to improve functional outcomes Baseline:formal HEP initiated 01/21/2023 Goal status: IN PROGRESS  2.  Demo reduced risk for falls per TUG test time 35 sec  Baseline: 62 sec w/ RW>61 sec 01/21/2023; 53.9 sec with RW 02/04/23 Goal status: NOT MET 02/04/23    LONG TERM GOALS: Target date: 03/04/2023    Demo supervision ambulation x 75 ft w/ RW with multiple turns to improve safety with household ambulation Baseline: CGA-min A x 40 ft; 89 ft with supervision 02/04/23 Goal status: MET 02/04/23  2.  Demo improved BLE  strength  as evidenced by 30 sec 5xSTS with minimal use of hands Baseline: unable; 12.18 sec with heavy use of UEs 02/04/23 Goal status: IN PROGRESS 02/04/23  3.  Improve functional ambulation and safety with ability to negotiate 1-2 steps with HR to improve household navigation Baseline: unable; 3 stairs with B handrail and CGA-min A 02/04/23 Goal status: IN PROGRESS 02/04/23  4.  Demo improved safety and mobility negotiating up/down curb w/ CGA and AD Baseline: unable; NT d/t safety 02/04/23 Goal status: IN PROGRESS  02/04/23   5. Demo supervision ambulation x 150 ft w/ RW with multiple turns to improve safety with household ambulation Baseline: 89 ft with RW Goal status: INITIAL 02/04/23    ASSESSMENT:  CLINICAL IMPRESSION: Reports difficulty with stepping onto running board of vehicle, initiated with lateral step-ups to prepare for transfer. Proceeded with LE strengthening and then to static and dynamic standing activities to enhance motor control and improve stance stability to facilitaet improved safety with ambulation and transfers. Ended with heavy work on [employer] for compound movement LE strength. Quick onset of right hip flexion fatigue limiting repetitive step height mobility. Continued sessions to progress POC details and enhance functional indepenence  OBJECTIVE IMPAIRMENTS: Abnormal gait, decreased activity tolerance, decreased balance, decreased coordination, decreased knowledge of use of DME, decreased mobility, difficulty walking, decreased strength, and impaired tone.   ACTIVITY LIMITATIONS: carrying, lifting, bending, standing, stairs, transfers, bed mobility, and locomotion level  PARTICIPATION LIMITATIONS: meal prep, cleaning, laundry, shopping, and community activity  PERSONAL FACTORS: Age, Time since onset of injury/illness/exacerbation, and 1-2 comorbidities: PMH  are also affecting patient's functional outcome.   REHAB POTENTIAL: Good  CLINICAL DECISION MAKING:  Evolving/moderate complexity  EVALUATION COMPLEXITY: Moderate  PLAN:  PT FREQUENCY: 2x/week  PT DURATION: 4 weeks  PLANNED INTERVENTIONS: Therapeutic exercises, Therapeutic activity, Neuromuscular re-education, Balance training, Gait training, Patient/Family education, Self Care, Joint mobilization, Stair training, Vestibular training, Canalith repositioning, Orthotic/Fit training, DME instructions, Aquatic Therapy, Cryotherapy, Moist heat, Taping, and Manual therapy  PLAN FOR NEXT SESSION: Progress HEP as needed; review home standing activities at kitchen counter (pt doesn't necessarily need cushion at this time).  Work towards being able to negotiate 1-2 steps (try 4" step at stairs).  Gait with turns.       3:13 PM, 02/06/23 M. Shary Decamp, PT, DPT Physical Therapist- [place] Office Number: [phone]

## 2023-02-07 MED FILL — Dexamethasone Sodium Phosphate Inj 100 MG/10ML: INTRAMUSCULAR | Qty: 1 | Status: AC

## 2023-02-11 ENCOUNTER — Inpatient Hospital Stay: Payer: PPO

## 2023-02-11 ENCOUNTER — Other Ambulatory Visit: Payer: PPO

## 2023-02-11 ENCOUNTER — Inpatient Hospital Stay: Payer: PPO | Attending: Internal Medicine | Admitting: Internal Medicine

## 2023-02-11 ENCOUNTER — Other Ambulatory Visit: Payer: Self-pay | Admitting: *Deleted

## 2023-02-11 VITALS — BP 116/69 | HR 110 | Temp 99.9°F | Resp 16 | Ht 63.0 in | Wt 148.0 lb

## 2023-02-11 VITALS — BP 101/78 | HR 100 | Resp 17

## 2023-02-11 DIAGNOSIS — C3490 Malignant neoplasm of unspecified part of unspecified bronchus or lung: Secondary | ICD-10-CM | POA: Diagnosis not present

## 2023-02-11 DIAGNOSIS — C3431 Malignant neoplasm of lower lobe, right bronchus or lung: Secondary | ICD-10-CM | POA: Insufficient documentation

## 2023-02-11 DIAGNOSIS — Z79620 Long term (current) use of immunosuppressive biologic: Secondary | ICD-10-CM | POA: Diagnosis not present

## 2023-02-11 DIAGNOSIS — Z5112 Encounter for antineoplastic immunotherapy: Secondary | ICD-10-CM | POA: Insufficient documentation

## 2023-02-11 DIAGNOSIS — C7951 Secondary malignant neoplasm of bone: Secondary | ICD-10-CM | POA: Insufficient documentation

## 2023-02-11 DIAGNOSIS — Z95828 Presence of other vascular implants and grafts: Secondary | ICD-10-CM

## 2023-02-11 DIAGNOSIS — C787 Secondary malignant neoplasm of liver and intrahepatic bile duct: Secondary | ICD-10-CM | POA: Insufficient documentation

## 2023-02-11 DIAGNOSIS — Z923 Personal history of irradiation: Secondary | ICD-10-CM | POA: Diagnosis not present

## 2023-02-11 DIAGNOSIS — C3491 Malignant neoplasm of unspecified part of right bronchus or lung: Secondary | ICD-10-CM

## 2023-02-11 LAB — CMP (CANCER CENTER ONLY)
ALT: 13 U/L (ref 0–44)
AST: 16 U/L (ref 15–41)
Albumin: 3.9 g/dL (ref 3.5–5.0)
Alkaline Phosphatase: 105 U/L (ref 38–126)
Anion gap: 8 (ref 5–15)
BUN: 11 mg/dL (ref 8–23)
CO2: 27 mmol/L (ref 22–32)
Calcium: 9.1 mg/dL (ref 8.9–10.3)
Chloride: 104 mmol/L (ref 98–111)
Creatinine: 0.56 mg/dL (ref 0.44–1.00)
GFR, Estimated: >60 mL/min (ref >60)
Glucose, Bld: 151 mg/dL — ABNORMAL HIGH (ref 70–99)
Potassium: 3.3 mmol/L — ABNORMAL LOW (ref 3.5–5.1)
Sodium: 139 mmol/L (ref 135–145)
Total Bilirubin: 0.5 mg/dL (ref 0.3–1.2)
Total Protein: 6.7 g/dL (ref 6.5–8.1)

## 2023-02-11 LAB — CBC WITH DIFFERENTIAL (CANCER CENTER ONLY)
Abs Immature Granulocytes: 0.02 10*3/uL (ref 0.00–0.07)
Basophils Absolute: 0 10*3/uL (ref 0.0–0.1)
Basophils Relative: 1 %
Eosinophils Absolute: 0.1 10*3/uL (ref 0.0–0.5)
Eosinophils Relative: 1 %
HCT: 36.3 % (ref 36.0–46.0)
Hemoglobin: 12.5 g/dL (ref 12.0–15.0)
Immature Granulocytes: 0 %
Lymphocytes Relative: 6 %
Lymphs Abs: 0.3 10*3/uL — ABNORMAL LOW (ref 0.7–4.0)
MCH: 32.1 pg (ref 26.0–34.0)
MCHC: 34.4 g/dL (ref 30.0–36.0)
MCV: 93.1 fL (ref 80.0–100.0)
Monocytes Absolute: 0.5 10*3/uL (ref 0.1–1.0)
Monocytes Relative: 11 %
Neutro Abs: 4.1 10*3/uL (ref 1.7–7.7)
Neutrophils Relative %: 81 %
Platelet Count: 221 10*3/uL (ref 150–400)
RBC: 3.9 MIL/uL (ref 3.87–5.11)
RDW: 13.4 % (ref 11.5–15.5)
WBC Count: 5.1 10*3/uL (ref 4.0–10.5)
nRBC: 0 % (ref 0.0–0.2)

## 2023-02-11 LAB — TSH: TSH: 4.448 u[IU]/mL (ref 0.350–4.500)

## 2023-02-11 MED ORDER — SODIUM CHLORIDE 0.9 % IV SOLN: Freq: Once | INTRAVENOUS | Status: AC

## 2023-02-11 MED ORDER — SODIUM CHLORIDE 0.9% FLUSH
10.0000 mL | INTRAVENOUS | Status: DC | PRN
  Administered 2023-02-11: 10 mL

## 2023-02-11 MED ORDER — SODIUM CHLORIDE 0.9% FLUSH
10.0000 mL | Freq: Once | INTRAVENOUS | Status: AC
  Administered 2023-02-11: 10 mL

## 2023-02-11 MED ORDER — SODIUM CHLORIDE 0.9 % IV SOLN
10.0000 mg | Freq: Once | INTRAVENOUS | Status: AC
  Administered 2023-02-11: 10 mg via INTRAVENOUS
  Filled 2023-02-11: qty 10

## 2023-02-11 MED ORDER — SODIUM CHLORIDE 0.9 % IV SOLN
500.0000 mg/m2 | Freq: Once | INTRAVENOUS | Status: AC
  Administered 2023-02-11: 900 mg via INTRAVENOUS
  Filled 2023-02-11: qty 20

## 2023-02-11 MED ORDER — HEPARIN SOD (PORK) LOCK FLUSH 100 UNIT/ML IV SOLN
500.0000 [IU] | Freq: Once | INTRAVENOUS | Status: AC | PRN
  Administered 2023-02-11: 500 [IU]

## 2023-02-11 MED ORDER — PROCHLORPERAZINE MALEATE 10 MG PO TABS
10.0000 mg | ORAL_TABLET | Freq: Once | ORAL | Status: AC
  Administered 2023-02-11: 10 mg via ORAL
  Filled 2023-02-11: qty 1

## 2023-02-11 MED ORDER — CYANOCOBALAMIN 1000 MCG/ML IJ SOLN
1000.0000 ug | Freq: Once | INTRAMUSCULAR | Status: AC
  Administered 2023-02-11: 1000 ug via INTRAMUSCULAR
  Filled 2023-02-11: qty 1

## 2023-02-11 MED ORDER — SODIUM CHLORIDE 0.9 % IV SOLN
200.0000 mg | Freq: Once | INTRAVENOUS | Status: AC
  Administered 2023-02-11: 200 mg via INTRAVENOUS
  Filled 2023-02-11: qty 200

## 2023-02-11 NOTE — Progress Notes (Signed)
[facility] [phone]   Fax:([phone]) [phone]  OFFICE PROGRESS NOTE  [name], PA-C [address] [place] [address]  DIAGNOSIS: Stage IV (T1b, N2, M1 C) non-small cell lung cancer, adenocarcinoma presented with right lower lobe lung nodule in addition to small bilateral pulmonary nodules and right hilar and mediastinal lymphadenopathy in addition to liver and bone metastasis diagnosed in April 2024. 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   PDL1: 20%   Guardant 360: no actionable mutations.    PRIOR THERAPY: 1) Decompressive thoracic laminectomy of the T7 with spinal cord decompression and biopsy of the epidural mass in April 2024.  2) Palliative radiation to the bone under the care of Dr. Basilio Cairo. Last day scheduled for 10/25/22    CURRENT THERAPY: Carboplatin for an AUC of 5, Alimta 500 mg/m, Keytruda 200 mg IV every 3 weeks.  First dose expected on 10/28/2022. Status post 5 cycles.  Starting from cycle #5 the patient is on maintenance treatment with Alimta and Keytruda every 3 weeks.  INTERVAL HISTORY: Hayley Wu 72 y.o. female returns to the clinic today for follow-up visit.  The patient is feeling fine today with no concerning complaints.  She denied having any current chest pain, shortness of breath, cough or hemoptysis.  She has no nausea, vomiting, diarrhea or constipation.  She has no headache or visual changes.  She denied having any recent weight loss or night sweats.  She has been tolerating her treatment with maintenance Alimta and Keytruda fairly well.  She is here for evaluation before starting cycle #6 of her treatment.   MEDICAL HISTORY: Past Medical History:  Diagnosis Date   Allergy    Contact lens/glasses fitting    Hypertension     ALLERGIES:  is allergic to erythromycin, amoxicillin, latex, and  prednisone.  MEDICATIONS:  Current Outpatient Medications  Medication Sig Dispense Refill   acetaminophen (TYLENOL) 500 MG tablet Take 500 mg by mouth every 6 (six) hours as needed for moderate pain.     amLODipine (NORVASC) 10 MG tablet Take 10 mg by mouth daily.     folic acid (FOLVITE) 1 MG tablet Take 1 tablet by mouth once daily 30 tablet 0   levothyroxine (SYNTHROID) 100 MCG tablet Take 1 tablet (100 mcg total) by mouth daily at 6 (six) AM. 30 tablet 2   lidocaine-prilocaine (EMLA) cream Apply 1 Application topically as needed. 30 g 2   losartan (COZAAR) 100 MG tablet Take 100 mg by mouth daily.     oxyCODONE (OXY IR/ROXICODONE) 5 MG immediate release tablet Take 1 tablet (5 mg total) by mouth every 3 (three) hours as needed for moderate pain ((score 4 to 6)). (Patient not taking: Reported on 01/20/2023) 30 tablet 0   prochlorperazine (COMPAZINE) 10 MG tablet Take 1 tablet (10 mg total) by mouth every 6 (six) hours as needed. (Patient not taking: Reported on 01/20/2023) 30 tablet 2   senna (SENOKOT) 8.6 MG TABS tablet Take 1 tablet (8.6 mg total) by mouth 2 (two) times daily. (Patient not taking: Reported on 01/20/2023) 120 tablet 0   VITAMIN D PO Take 1 tablet by mouth daily.     No current facility-administered medications for this visit.    SURGICAL HISTORY:  Past Surgical History:  Procedure Laterality Date   ABDOMINAL HYSTERECTOMY     BREAST SURGERY  2010   rt lumpectomy-neg   IR IMAGING GUIDED PORT INSERTION  12/18/2022   LAMINECTOMY N/A 09/21/2022   Procedure: THORACIC LAMINECTOMY FOR TUMOR;  Surgeon: Tia Alert, MD;  Location: [facility] OR;  Service: Neurosurgery;  Laterality: N/A;   OPEN REDUCTION INTERNAL FIXATION (ORIF) DISTAL RADIAL FRACTURE Left 11/26/2013   Procedure: OPEN REDUCTION INTERNAL FIXATION (ORIF) LEFT  DISTAL RADIUS ;  Surgeon: Nestor Lewandowsky, MD;  Location: [facility];  Service: Orthopedics;  Laterality: Left;  with block   TUBAL LIGATION       REVIEW OF SYSTEMS:  A comprehensive review of systems was negative except for: Constitutional: positive for fatigue   PHYSICAL EXAMINATION: General appearance: alert, cooperative, and no distress Head: Normocephalic, without obvious abnormality, atraumatic Neck: no adenopathy, no JVD, supple, symmetrical, trachea midline, and thyroid not enlarged, symmetric, no tenderness/mass/nodules Lymph nodes: Cervical, supraclavicular, and axillary nodes normal. Resp: clear to auscultation bilaterally Back: symmetric, no curvature. ROM normal. No CVA tenderness. Cardio: regular rate and rhythm, S1, S2 normal, no murmur, click, rub or gallop GI: soft, non-tender; bowel sounds normal; no masses,  no organomegaly Extremities: extremities normal, atraumatic, no cyanosis or edema  ECOG PERFORMANCE STATUS: 1 - Symptomatic but completely ambulatory  Blood pressure 116/69, pulse (!) 110, temperature 99.9 F (37.7 C), resp. rate 16, height 5\' 3"  (1.6 m), weight 148 lb (67.1 kg), SpO2 97%.  LABORATORY DATA: Lab Results  Component Value Date   WBC 5.1 02/11/2023   HGB 12.5 02/11/2023   HCT 36.3 02/11/2023   MCV 93.1 02/11/2023   PLT 221 02/11/2023      Chemistry      Component Value Date/Time   NA 142 01/20/2023 1017   K 3.6 01/20/2023 1017   CL 105 01/20/2023 1017   CO2 29 01/20/2023 1017   BUN 12 01/20/2023 1017   CREATININE 0.48 01/20/2023 1017      Component Value Date/Time   CALCIUM 9.2 01/20/2023 1017   ALKPHOS 95 01/20/2023 1017   AST 20 01/20/2023 1017   ALT 26 01/20/2023 1017   BILITOT 0.4 01/20/2023 1017       RADIOGRAPHIC STUDIES: No results found.  ASSESSMENT AND PLAN: This is a very pleasant 72 years old white female with Stage IV (T1b, N2, M1 C) non-small cell lung cancer, adenocarcinoma presented with right lower lobe lung nodule in addition to small bilateral pulmonary nodules and right hilar and mediastinal lymphadenopathy in addition to liver and bone metastasis  diagnosed in April 2024. 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 Molecular studies showed no actionable mutations and PD-L1 expression of 20%. 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  She also had palliative radiation to the bone under the care of Dr. Basilio Cairo. Last day scheduled for 10/25/22. She is currently on palliative systemic chemotherapy with carboplatin for AUC of 5, Alimta 500 Mg/M2 and Keytruda 200 Mg IV every 3 weeks status post 5 cycles.  Starting from cycle #5 the patient is on maintenance treatment with Alimta and Keytruda every 3 weeks. The patient has been tolerating this treatment well with no concerning adverse effects. I recommended for her to proceed with cycle #6 today as planned. I will see her back for follow-up visit in 3 weeks for evaluation with repeat CT scan of the chest,  abdomen and pelvis for restaging of her disease. The patient was advised to call immediately if she has any other concerning symptoms in the interval. The patient voices understanding of current disease status and treatment options and is in agreement with the current care plan.  All questions were answered. The patient knows to call the clinic with any problems, questions or concerns. We can certainly see the patient much sooner if necessary.  The total time spent in the appointment was 20 minutes.  Disclaimer: This note was dictated with voice recognition software. Similar sounding words can inadvertently be transcribed and may not be corrected upon review.

## 2023-02-11 NOTE — Patient Instructions (Signed)
[facility]  Discharge Instructions: Thank you for choosing [facility] to provide your oncology and hematology care.   If you have a lab appointment with the Cancer Center, please go directly to the Cancer Center and check in at the registration area.   Wear comfortable clothing and clothing appropriate for easy access to any Portacath or PICC line.   We strive to give you quality time with your provider. You may need to reschedule your appointment if you arrive late (15 or more minutes).  Arriving late affects you and other patients whose appointments are after yours.  Also, if you miss three or more appointments without notifying the office, you may be dismissed from the clinic at the provider's discretion.      For prescription refill requests, have your pharmacy contact our office and allow 72 hours for refills to be completed.    Today you received the following chemotherapy and/or immunotherapy agents: Keytruda and Alimta      To help prevent nausea and vomiting after your treatment, we encourage you to take your nausea medication as directed.  BELOW ARE SYMPTOMS THAT SHOULD BE REPORTED IMMEDIATELY: *FEVER GREATER THAN 100.4 F (38 C) OR HIGHER *CHILLS OR SWEATING *NAUSEA AND VOMITING THAT IS NOT CONTROLLED WITH YOUR NAUSEA MEDICATION *UNUSUAL SHORTNESS OF BREATH *UNUSUAL BRUISING OR BLEEDING *URINARY PROBLEMS (pain or burning when urinating, or frequent urination) *BOWEL PROBLEMS (unusual diarrhea, constipation, pain near the anus) TENDERNESS IN MOUTH AND THROAT WITH OR WITHOUT PRESENCE OF ULCERS (sore throat, sores in mouth, or a toothache) UNUSUAL RASH, SWELLING OR PAIN  UNUSUAL VAGINAL DISCHARGE OR ITCHING   Items with * indicate a potential emergency and should be followed up as soon as possible or go to the Emergency Department if any problems should occur.  Please show the CHEMOTHERAPY ALERT CARD or IMMUNOTHERAPY ALERT  CARD at check-in to the Emergency Department and triage nurse.  Should you have questions after your visit or need to cancel or reschedule your appointment, please contact [facility]  Dept: [phone]  and follow the prompts.  Office hours are 8:00 a.m. to 4:30 p.m. Monday - Friday. Please note that voicemails left after 4:00 p.m. may not be returned until the following business day.  We are closed weekends and major holidays. You have access to a nurse at all times for urgent questions. Please call the main number to the clinic Dept: [phone] and follow the prompts.   For any non-urgent questions, you may also contact your provider using MyChart. We now offer e-Visits for anyone 18 and older to request care online for non-urgent symptoms. For details visit [email or web address].   Also download the MyChart app! Go to the app store, search "MyChart", open the app, select [place], and log in with your MyChart username and password.   

## 2023-02-12 LAB — T4: T4, Total: 12.6 ug/dL — ABNORMAL HIGH (ref 4.5–12.0)

## 2023-02-13 ENCOUNTER — Other Ambulatory Visit: Payer: Self-pay | Admitting: Physician Assistant

## 2023-02-13 DIAGNOSIS — C3491 Malignant neoplasm of unspecified part of right bronchus or lung: Secondary | ICD-10-CM

## 2023-02-18 ENCOUNTER — Encounter: Payer: Self-pay | Admitting: Physical Therapy

## 2023-02-18 ENCOUNTER — Ambulatory Visit: Payer: PPO | Attending: Family Medicine | Admitting: Physical Therapy

## 2023-02-18 DIAGNOSIS — R262 Difficulty in walking, not elsewhere classified: Secondary | ICD-10-CM | POA: Diagnosis not present

## 2023-02-18 DIAGNOSIS — M6281 Muscle weakness (generalized): Secondary | ICD-10-CM | POA: Insufficient documentation

## 2023-02-18 DIAGNOSIS — R2689 Other abnormalities of gait and mobility: Secondary | ICD-10-CM | POA: Insufficient documentation

## 2023-02-18 DIAGNOSIS — R2681 Unsteadiness on feet: Secondary | ICD-10-CM | POA: Diagnosis not present

## 2023-02-18 DIAGNOSIS — R29898 Other symptoms and signs involving the musculoskeletal system: Secondary | ICD-10-CM | POA: Diagnosis not present

## 2023-02-18 NOTE — Therapy (Signed)
OUTPATIENT PHYSICAL THERAPY NEURO TREATMENT NOTE   Patient Name: Hayley Wu MRN: [id number] DOB:04-18-1951, 72 y.o., female Today's Date: 02/18/2023   PCP: [name], PA-C REFERRING PROVIDER: [name], PA-C  END OF SESSION:  PT End of Session - 02/18/23 1302     Visit Number 10    Number of Visits 16    Date for PT Re-Evaluation 03/04/23    Authorization Type [employer]    Progress Note Due on Visit --   *PN done at visit 8   PT Start Time 1310    PT Stop Time 1355    PT Time Calculation (min) 45 min    Equipment Utilized During Treatment Gait belt    Activity Tolerance Patient tolerated treatment well    Behavior During Therapy WFL for tasks assessed/performed                Past Medical History:  Diagnosis Date   Allergy    Contact lens/glasses fitting    Hypertension    Past Surgical History:  Procedure Laterality Date   ABDOMINAL HYSTERECTOMY     BREAST SURGERY  2010   rt lumpectomy-neg   IR IMAGING GUIDED PORT INSERTION  12/18/2022   LAMINECTOMY N/A 09/21/2022   Procedure: THORACIC LAMINECTOMY FOR TUMOR;  Surgeon: Tia Alert, MD;  Location: [facility] OR;  Service: Neurosurgery;  Laterality: N/A;   OPEN REDUCTION INTERNAL FIXATION (ORIF) DISTAL RADIAL FRACTURE Left 11/26/2013   Procedure: OPEN REDUCTION INTERNAL FIXATION (ORIF) LEFT  DISTAL RADIUS ;  Surgeon: Nestor Lewandowsky, MD;  Location: [facility];  Service: Orthopedics;  Laterality: Left;  with block   TUBAL LIGATION     Patient Active Problem List   Diagnosis Date Noted   Port-A-Cath in place 12/30/2022   Encounter for antineoplastic immunotherapy 11/18/2022   Encounter for antineoplastic chemotherapy 11/18/2022   Constipation 11/05/2022   Goals of care, counseling/discussion 10/21/2022   Cancer, metastatic to bone (HCC) 10/04/2022   Adenocarcinoma of right lung, stage 4 (HCC) 10/01/2022   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 09/20/2022   Hypokalemia 09/20/2022   Lung mass 09/20/2022   Hypothyroidism 09/20/2022   Cholelithiasis 09/20/2022   Hypertension 09/23/2011    ONSET DATE: April 2024  REFERRING DIAG:  R29.898 (ICD-10-CM) - Other symptoms and signs involving the musculoskeletal system    THERAPY DIAG:  Muscle weakness (generalized)  Other abnormalities of gait and mobility  Unsteadiness on feet  Rationale for Evaluation and Treatment: Rehabilitation  SUBJECTIVE:  SUBJECTIVE STATEMENT: I had the infusion and it's knocked me back a bit.  Was sick all week.  Sometimes it's remembering to take the anti-nausea medication in time.  I really haven't done much in the past week.  Pt accompanied by: family member  PERTINENT HISTORY:  Stage IV (T1b, N2, M1 C) non-small cell lung cancer, adenocarcinoma presented with right lower lobe lung nodule in addition to small bilateral pulmonary nodules and right hilar and mediastinal lymphadenopathy in addition to liver and bone metastasis diagnosed in April 2024. 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 She is currently undergoing palliative systemic chemotherapy and immunotherapy.  PAIN:  Are you having pain? Yes: NPRS scale: 3-4/10 Pain location: mid to low back Pain description: sore Aggravating factors: certain positions/prolonged sitting Relieving factors: change of position  PRECAUTIONS: Fall  RED FLAGS: None   WEIGHT BEARING RESTRICTIONS: No  FALLS: Has patient fallen in last 6 months? Yes. Number of falls 3  LIVING ENVIRONMENT: Lives with: lives  with their family Lives in: House/apartment Stairs: No Has following equipment at home: Environmental consultant - 2 wheeled and Wheelchair (manual)  PLOF: Independent  PATIENT GOALS: improve mobility to return to walking  OBJECTIVE:    TODAY'S TREATMENT: 02/18/2023 Activity Comments  LAQ, 3 x 10 reps Added 4# weight  Seated march 6 reps 4#  Hamstring curls 3x10 Red theraband  Seated clamshells 3x10 Red theraband  Sit to stand x 4 reps from mat Min UE support  Standing at RW: Feet apart EO head turns/nods x 6 reps Feet apart EC head steady 3 x 10 Min assist for balance, intermittent UE support  Gait x 43 ft x 2 with RW min guard, then 43 additional ft with RW Pt with mild recurvatum RLE  Forward step ups 5 reps each leg leading Lateral step ups, to 4" step Heavy BUE support      PATIENT EDUCATION: Education details: Continuing current HEP Person educated: Patient Education method: Programmer, multimedia, Demonstration, Actor cues, Verbal cues, and Handouts Education comprehension: verbalized understanding and returned demonstration   HOME EXERCISE PROGRAM Last updated: 02/04/23 Access Code: [email or web address] URL: [email or web address] Date: 02/04/2023 Prepared by: [facility] - Outpatient  Rehab - [facility]  Program Notes perform standing activities at counter top and chair behihnd for safety. start with family supervision first.   Exercises - Seated Hamstring Stretch  - 1-2 x daily - 7 x weekly - 1 sets - 3 reps - 15-30 sec hold - Seated Gastroc Stretch with Strap  - 1-2 x daily - 7 x weekly - 1 sets - 3 reps - 15-30 sec hold - Standing March with Counter Support  - 1 x daily - 5 x weekly - 2 sets - 10 reps - Mini Squat with Counter Support  - 1 x daily - 5 x weekly - 2 sets - 10 reps - Seated Long Arc Quad  - 1 x daily - 5 x weekly - 2 sets - 10 reps  ------------------------------------------------------------------------------------------- DIAGNOSTIC FINDINGS:  MPRESSION: 1.  No acute intracranial abnormality. No evidence for intracranial metastatic disease on this noncontrast examination. 2. Moderately advanced chronic microvascular ischemic disease.  IMPRESSION: 1. Widespread osseous metastatic disease involving the thoracic spine as above. Associated pathologic compression fractures of T6, T7, T8, and T12. Fracture most severe at T7 where there is associated vertebral plana formation. Adjacent epidural extension as above with resultant severe spinal stenosis and  cord compression at T7. Emergent neuro surgical consultation recommended. 2. Few scattered probable pulmonary nodules within the partially visualized right lung, concerning for metastatic disease. 3. Possible cholelithiasis.  COGNITION: Overall cognitive status: Within functional limits for tasks assessed   SENSATION: Decreased light touch BLE  COORDINATION: Impaired by LE weakness  EDEMA:  Bilat ankles, non-pitting  MUSCLE TONE: RLE: Hypotonic  MUSCLE LENGTH: WFL  DTRs:  NT  POSTURE: No Significant postural limitations  LOWER EXTREMITY ROM:     Active  Right Eval Left Eval  Hip flexion    Hip extension    Hip abduction    Hip adduction    Hip internal rotation    Hip external rotation    Knee flexion 115 120  Knee extension 0 0  Ankle dorsiflexion 10 10  Ankle plantarflexion    Ankle inversion    Ankle eversion     (Blank rows = not tested)  LOWER EXTREMITY MMT:    MMT Right Eval Left Eval  Hip flexion 3- 3+  Hip extension    Hip abduction 3- 3  Hip adduction 3 3  Hip internal rotation    Hip external rotation    Knee flexion 3 3+  Knee extension 3- 3  Ankle dorsiflexion 2+ 3+  Ankle plantarflexion    Ankle inversion    Ankle eversion    (Blank rows = not tested)  BED MOBILITY:  Mod A--pt reports using lift-chair recliner for sleeping at this time  TRANSFERS: Assistive device utilized: Environmental consultant - 2 wheeled  Sit to stand: SBA and CGA Stand to sit:  SBA and CGA Chair to chair: SBA and CGA Floor:  NT    CURB:  Level of Assistance:  NT due to fear/anxiety from recent falls Assistive device utilized: Environmental consultant - 2 wheeled Curb Comments:   STAIRS: Level of Assistance:  NT due to deficits and fear  GAIT: Gait pattern: Right foot flat, Left foot flat, genu recurvatum- Right, and genu recurvatum- Left Distance walked: 40 ft Assistive device utilized: Environmental consultant - 2 wheeled Level of assistance: CGA and Min A Comments: pt reports she was provided AFO at hospital for right ankle--does not have it with her today  FUNCTIONAL TESTS:  5 times sit to stand: requires use of BUE Timed up and go (TUG): 62 sec w/ RW Static standing: Fair balance x 10 sec    PATIENT EDUCATION: Education details: trials of standing at kitchen sink with couch cushion to block knees to prevent buckling and to reduce need for knee hyperextension Person educated: Patient and Child(ren) Education method: Explanation Education comprehension: verbalized understanding  HOME EXERCISE PROGRAM: TBD  GOALS: Goals reviewed with patient? Yes  SHORT TERM GOALS: Target date: 01/22/2023    Patient will be independent in HEP to improve functional outcomes Baseline:formal HEP initiated 01/21/2023 Goal status: IN PROGRESS  2.  Demo reduced risk for falls per TUG test time 35 sec  Baseline: 62 sec w/ RW>61 sec 01/21/2023; 53.9 sec with RW 02/04/23 Goal status: NOT MET 02/04/23    LONG TERM GOALS: Target date: 03/04/2023    Demo supervision ambulation x 75 ft w/ RW with multiple turns to improve safety with household ambulation Baseline: CGA-min A x 40 ft; 89 ft with supervision 02/04/23 Goal status: MET 02/04/23  2.  Demo improved BLE strength as evidenced by 30 sec 5xSTS with minimal use of hands Baseline: unable; 12.18 sec with heavy use of UEs 02/04/23 Goal status: IN PROGRESS 02/04/23  3.  Improve functional ambulation and safety with ability to negotiate 1-2 steps  with HR to improve household navigation Baseline: unable; 3 stairs with B handrail and CGA-min A 02/04/23 Goal status: IN PROGRESS 02/04/23  4.  Demo improved safety and mobility negotiating up/down curb w/ CGA and AD Baseline: unable; NT d/t safety 02/04/23 Goal status: IN PROGRESS  02/04/23   5. Demo supervision ambulation x 150 ft w/ RW with multiple turns to improve safety with household ambulation Baseline: 89 ft with RW Goal status: INITIAL 02/04/23    ASSESSMENT:  CLINICAL IMPRESSION: Pt arrives today with no complaints, other than she felt sick weak all last week following her chemo infusion.  Previous visit here was on 02/06/2023, and pt able to perform multiple sets of seated strengthening exercises as well as standing step up exercises today.  She is able to ambulate 43 ft of gait, 3 reps with RW with minimal recurvatum of R knee with fatigue.  She reports fatigue at very end of session, and continues to report she is unable to use floor running board of family vehicle (so they use a lower vehicle).  Will continue to work on BLE strengthening and step ups for further strength and balance work. OBJECTIVE IMPAIRMENTS: Abnormal gait, decreased activity tolerance, decreased balance, decreased coordination, decreased knowledge of use of DME, decreased mobility, difficulty walking, decreased strength, and impaired tone.   ACTIVITY LIMITATIONS: carrying, lifting, bending, standing, stairs, transfers, bed mobility, and locomotion level  PARTICIPATION LIMITATIONS: meal prep, cleaning, laundry, shopping, and community activity  PERSONAL FACTORS: Age, Time since onset of injury/illness/exacerbation, and 1-2 comorbidities: PMH  are also affecting patient's functional outcome.   REHAB POTENTIAL: Good  CLINICAL DECISION MAKING: Evolving/moderate complexity  EVALUATION COMPLEXITY: Moderate  PLAN:  PT FREQUENCY: 2x/week  PT DURATION: 4 weeks  PLANNED INTERVENTIONS: Therapeutic exercises,  Therapeutic activity, Neuromuscular re-education, Balance training, Gait training, Patient/Family education, Self Care, Joint mobilization, Stair training, Vestibular training, Canalith repositioning, Orthotic/Fit training, DME instructions, Aquatic Therapy, Cryotherapy, Moist heat, Taping, and Manual therapy  PLAN FOR NEXT SESSION: Work on functional strengthening-step ups, step taps; standing balance exercises.  Work towards being able to negotiate 1-2 steps (try 4" step at stairs).  Gait with turns.     Lonia Blood, PT 02/18/23 2:00 PM Phone: [phone] Fax: [phone]  [facility] at [facility] [address] [place], [place] [address] Phone # [phone] Fax # [phone]

## 2023-02-20 ENCOUNTER — Ambulatory Visit: Payer: PPO

## 2023-02-20 DIAGNOSIS — M6281 Muscle weakness (generalized): Secondary | ICD-10-CM | POA: Diagnosis not present

## 2023-02-20 DIAGNOSIS — R262 Difficulty in walking, not elsewhere classified: Secondary | ICD-10-CM

## 2023-02-20 DIAGNOSIS — R2689 Other abnormalities of gait and mobility: Secondary | ICD-10-CM

## 2023-02-20 DIAGNOSIS — R29898 Other symptoms and signs involving the musculoskeletal system: Secondary | ICD-10-CM

## 2023-02-20 DIAGNOSIS — R2681 Unsteadiness on feet: Secondary | ICD-10-CM

## 2023-02-20 NOTE — Therapy (Signed)
OUTPATIENT PHYSICAL THERAPY NEURO TREATMENT NOTE   Patient Name: Hayley Wu MRN: [id number] DOB:06-05-1951, 72 y.o., female Today's Date: 02/20/2023   PCP: [name], PA-C REFERRING PROVIDER: [name], PA-C  END OF SESSION:  PT End of Session - 02/20/23 1019     Visit Number 11    Number of Visits 16    Date for PT Re-Evaluation 03/04/23    Authorization Type [employer]    Progress Note Due on Visit --   *PN done at visit 8   PT Start Time 1018    PT Stop Time 1100    PT Time Calculation (min) 42 min    Equipment Utilized During Treatment Gait belt    Activity Tolerance Patient tolerated treatment well    Behavior During Therapy WFL for tasks assessed/performed                Past Medical History:  Diagnosis Date   Allergy    Contact lens/glasses fitting    Hypertension    Past Surgical History:  Procedure Laterality Date   ABDOMINAL HYSTERECTOMY     BREAST SURGERY  2010   rt lumpectomy-neg   IR IMAGING GUIDED PORT INSERTION  12/18/2022   LAMINECTOMY N/A 09/21/2022   Procedure: THORACIC LAMINECTOMY FOR TUMOR;  Surgeon: Tia Alert, MD;  Location: [facility] OR;  Service: Neurosurgery;  Laterality: N/A;   OPEN REDUCTION INTERNAL FIXATION (ORIF) DISTAL RADIAL FRACTURE Left 11/26/2013   Procedure: OPEN REDUCTION INTERNAL FIXATION (ORIF) LEFT  DISTAL RADIUS ;  Surgeon: Nestor Lewandowsky, MD;  Location: [facility];  Service: Orthopedics;  Laterality: Left;  with block   TUBAL LIGATION     Patient Active Problem List   Diagnosis Date Noted   Port-A-Cath in place 12/30/2022   Encounter for antineoplastic immunotherapy 11/18/2022   Encounter for antineoplastic chemotherapy 11/18/2022   Constipation 11/05/2022   Goals of care, counseling/discussion 10/21/2022   Cancer, metastatic to bone (HCC) 10/04/2022   Adenocarcinoma of right lung, stage 4 (HCC) 10/01/2022   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 09/20/2022   Hypokalemia 09/20/2022   Lung mass 09/20/2022   Hypothyroidism 09/20/2022   Cholelithiasis 09/20/2022   Hypertension 09/23/2011    ONSET DATE: April 2024  REFERRING DIAG:  R29.898 (ICD-10-CM) - Other symptoms and signs involving the musculoskeletal system    THERAPY DIAG:  Muscle weakness (generalized)  Other abnormalities of gait and mobility  Unsteadiness on feet  Other symptoms and signs involving the musculoskeletal system  Difficulty in walking, not elsewhere classified  Rationale for Evaluation and Treatment: Rehabilitation  SUBJECTIVE:  SUBJECTIVE STATEMENT: Still feeling weak and lousy from last chemo session.  Pt accompanied by: family member  PERTINENT HISTORY:  Stage IV (T1b, N2, M1 C) non-small cell lung cancer, adenocarcinoma presented with right lower lobe lung nodule in addition to small bilateral pulmonary nodules and right hilar and mediastinal lymphadenopathy in addition to liver and bone metastasis diagnosed in April 2024. 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 She is currently undergoing palliative systemic chemotherapy and immunotherapy.  PAIN:  Are you having pain? Yes: NPRS scale: 3-4/10 Pain location: mid to low back Pain description: sore Aggravating factors: certain positions/prolonged sitting Relieving factors: change of position  PRECAUTIONS: Fall  RED FLAGS: None   WEIGHT BEARING RESTRICTIONS: No  FALLS: Has patient fallen in last 6 months? Yes. Number of falls 3  LIVING ENVIRONMENT: Lives with: lives with their  family Lives in: House/apartment Stairs: No Has following equipment at home: Environmental consultant - 2 wheeled and Wheelchair (manual)  PLOF: Independent  PATIENT GOALS: improve mobility to return to walking  OBJECTIVE:   TODAY'S TREATMENT: 02/20/23 Activity Comments  LAQ 1x18,16, 14 5#  Seated hip flexion 3x10 5#  Seated hamstring curls 3x10 10#  Gait training 3x100 ft w/ RW and supervision level surfaces           TODAY'S TREATMENT: 02/18/2023 Activity Comments  LAQ, 3 x 10 reps Added 4# weight  Seated march 6 reps 4#  Hamstring curls 3x10 Red theraband  Seated clamshells 3x10 Red theraband  Sit to stand x 4 reps from mat Min UE support  Standing at RW: Feet apart EO head turns/nods x 6 reps Feet apart EC head steady 3 x 10 Min assist for balance, intermittent UE support  Gait x 43 ft x 2 with RW min guard, then 43 additional ft with RW Pt with mild recurvatum RLE  Forward step ups 5 reps each leg leading Lateral step ups, to 4" step Heavy BUE support      PATIENT EDUCATION: Education details: Continuing current HEP Person educated: Patient Education method: Programmer, multimedia, Facilities manager, Actor cues, Verbal cues, and Handouts Education comprehension: verbalized understanding and returned demonstration   HOME EXERCISE PROGRAM Last updated: 02/04/23 Access Code: [email or web address] URL: [email or web address] Date: 02/04/2023 Prepared by: [facility] - Outpatient  Rehab - [facility]  Program Notes perform standing activities at counter top and chair behihnd for safety. start with family supervision first.   Exercises - Seated Hamstring Stretch  - 1-2 x daily - 7 x weekly - 1 sets - 3 reps - 15-30 sec hold - Seated Gastroc Stretch with Strap  - 1-2 x daily - 7 x weekly - 1 sets - 3 reps - 15-30 sec hold - Standing March with Counter Support  - 1 x daily - 5 x weekly - 2 sets - 10 reps - Mini Squat with Counter Support  - 1 x daily - 5 x weekly - 2 sets - 10 reps - Seated  Long Arc Quad  - 1 x daily - 5 x weekly - 2 sets - 10 reps  ------------------------------------------------------------------------------------------- DIAGNOSTIC FINDINGS:  MPRESSION: 1. No acute intracranial abnormality. No evidence for intracranial metastatic disease on this noncontrast examination. 2. Moderately advanced chronic microvascular ischemic disease.  IMPRESSION: 1. Widespread osseous metastatic disease involving the thoracic spine as above. Associated pathologic compression fractures of T6, T7, T8, and T12. Fracture most severe at T7 where there is associated  vertebral plana formation. Adjacent epidural extension as above with resultant severe spinal stenosis and cord compression at T7. Emergent neuro surgical consultation recommended. 2. Few scattered probable pulmonary nodules within the partially visualized right lung, concerning for metastatic disease. 3. Possible cholelithiasis.  COGNITION: Overall cognitive status: Within functional limits for tasks assessed   SENSATION: Decreased light touch BLE  COORDINATION: Impaired by LE weakness  EDEMA:  Bilat ankles, non-pitting  MUSCLE TONE: RLE: Hypotonic  MUSCLE LENGTH: WFL  DTRs:  NT  POSTURE: No Significant postural limitations  LOWER EXTREMITY ROM:     Active  Right Eval Left Eval  Hip flexion    Hip extension    Hip abduction    Hip adduction    Hip internal rotation    Hip external rotation    Knee flexion 115 120  Knee extension 0 0  Ankle dorsiflexion 10 10  Ankle plantarflexion    Ankle inversion    Ankle eversion     (Blank rows = not tested)  LOWER EXTREMITY MMT:    MMT Right Eval Left Eval  Hip flexion 3- 3+  Hip extension    Hip abduction 3- 3  Hip adduction 3 3  Hip internal rotation    Hip external rotation    Knee flexion 3 3+  Knee extension 3- 3  Ankle dorsiflexion 2+ 3+  Ankle plantarflexion    Ankle inversion    Ankle eversion    (Blank rows = not  tested)  BED MOBILITY:  Mod A--pt reports using lift-chair recliner for sleeping at this time  TRANSFERS: Assistive device utilized: Environmental consultant - 2 wheeled  Sit to stand: SBA and CGA Stand to sit: SBA and CGA Chair to chair: SBA and CGA Floor:  NT    CURB:  Level of Assistance:  NT due to fear/anxiety from recent falls Assistive device utilized: Environmental consultant - 2 wheeled Curb Comments:   STAIRS: Level of Assistance:  NT due to deficits and fear  GAIT: Gait pattern: Right foot flat, Left foot flat, genu recurvatum- Right, and genu recurvatum- Left Distance walked: 40 ft Assistive device utilized: Environmental consultant - 2 wheeled Level of assistance: CGA and Min A Comments: pt reports she was provided AFO at hospital for right ankle--does not have it with her today  FUNCTIONAL TESTS:  5 times sit to stand: requires use of BUE Timed up and go (TUG): 62 sec w/ RW Static standing: Fair balance x 10 sec    PATIENT EDUCATION: Education details: trials of standing at kitchen sink with couch cushion to block knees to prevent buckling and to reduce need for knee hyperextension Person educated: Patient and Child(ren) Education method: Explanation Education comprehension: verbalized understanding  HOME EXERCISE PROGRAM: TBD  GOALS: Goals reviewed with patient? Yes  SHORT TERM GOALS: Target date: 01/22/2023    Patient will be independent in HEP to improve functional outcomes Baseline:formal HEP initiated 01/21/2023 Goal status: IN PROGRESS  2.  Demo reduced risk for falls per TUG test time 35 sec  Baseline: 62 sec w/ RW>61 sec 01/21/2023; 53.9 sec with RW 02/04/23 Goal status: NOT MET 02/04/23    LONG TERM GOALS: Target date: 03/04/2023    Demo supervision ambulation x 75 ft w/ RW with multiple turns to improve safety with household ambulation Baseline: CGA-min A x 40 ft; 89 ft with supervision 02/04/23 Goal status: MET 02/04/23  2.  Demo improved BLE strength as evidenced by 30 sec 5xSTS with  minimal use of hands Baseline: unable; 12.18  sec with heavy use of UEs 02/04/23 Goal status: IN PROGRESS 02/04/23  3.  Improve functional ambulation and safety with ability to negotiate 1-2 steps with HR to improve household navigation Baseline: unable; 3 stairs with B handrail and CGA-min A 02/04/23 Goal status: IN PROGRESS 02/04/23  4.  Demo improved safety and mobility negotiating up/down curb w/ CGA and AD Baseline: unable; NT d/t safety 02/04/23 Goal status: IN PROGRESS  02/04/23   5. Demo supervision ambulation x 150 ft w/ RW with multiple turns to improve safety with household ambulation Baseline: 89 ft with RW Goal status: INITIAL 02/04/23    ASSESSMENT:  CLINICAL IMPRESSION: Pt reports some improvement in symptoms and energy since last chemo session. Initiated with strength training with emphasis on greater endurance for quadriceps to aid in walking tolerance. Hip flexion strength to improve ability to lift BLE in/out of bed.  Gait training w/ supervision on level surfaces for 90-100 ft increments w/ minimal issues with knee hyperextension in stance phase.  Discussed short-distance ambulation in home with intentional set-up of walking from w/c to arm chair in room for at-home practice if she and family are comfortable with this.  OBJECTIVE IMPAIRMENTS: Abnormal gait, decreased activity tolerance, decreased balance, decreased coordination, decreased knowledge of use of DME, decreased mobility, difficulty walking, decreased strength, and impaired tone.   ACTIVITY LIMITATIONS: carrying, lifting, bending, standing, stairs, transfers, bed mobility, and locomotion level  PARTICIPATION LIMITATIONS: meal prep, cleaning, laundry, shopping, and community activity  PERSONAL FACTORS: Age, Time since onset of injury/illness/exacerbation, and 1-2 comorbidities: PMH  are also affecting patient's functional outcome.   REHAB POTENTIAL: Good  CLINICAL DECISION MAKING: Evolving/moderate  complexity  EVALUATION COMPLEXITY: Moderate  PLAN:  PT FREQUENCY: 2x/week  PT DURATION: 4 weeks  PLANNED INTERVENTIONS: Therapeutic exercises, Therapeutic activity, Neuromuscular re-education, Balance training, Gait training, Patient/Family education, Self Care, Joint mobilization, Stair training, Vestibular training, Canalith repositioning, Orthotic/Fit training, DME instructions, Aquatic Therapy, Cryotherapy, Moist heat, Taping, and Manual therapy  PLAN FOR NEXT SESSION: Work on functional strengthening-step ups, step taps; standing balance exercises.  Work towards being able to negotiate 1-2 steps (try 4" step at stairs).  Gait with turns.     11:46 AM, 02/20/23 M. Shary Decamp, PT, DPT Physical Therapist-  Office Number: [phone]

## 2023-02-21 ENCOUNTER — Other Ambulatory Visit: Payer: Self-pay

## 2023-02-22 ENCOUNTER — Other Ambulatory Visit: Payer: Self-pay

## 2023-02-23 ENCOUNTER — Encounter: Payer: Self-pay | Admitting: Internal Medicine

## 2023-02-24 ENCOUNTER — Other Ambulatory Visit: Payer: Self-pay | Admitting: Internal Medicine

## 2023-02-24 MED ORDER — MIRTAZAPINE 30 MG PO TABS: 30.0000 mg | ORAL_TABLET | Freq: Every day | ORAL | 2 refills | Status: DC

## 2023-02-25 ENCOUNTER — Inpatient Hospital Stay: Payer: PPO | Admitting: Licensed Clinical Social Worker

## 2023-02-25 ENCOUNTER — Ambulatory Visit (HOSPITAL_BASED_OUTPATIENT_CLINIC_OR_DEPARTMENT_OTHER): Payer: PPO

## 2023-02-25 ENCOUNTER — Ambulatory Visit: Payer: PPO

## 2023-02-25 DIAGNOSIS — C3491 Malignant neoplasm of unspecified part of right bronchus or lung: Secondary | ICD-10-CM

## 2023-02-25 NOTE — Progress Notes (Signed)
[facility] CSW Progress Note  Clinical Child psychotherapist contacted patient by phone to discuss emotional support services available.  Pt recently started on an anti-depressant due to significant change in mood and energy.  Counseling discussed at length.  Pt would like to try the anti-depressant for a period of time first and if additional support is needed will reach out for counseling at that time.  Pt aware counseling can be in person, on-line or over the telephone.  Contact information for CSW provided.  CSW to remain available throughout duration of treatment as appropriate.      Rachel Moulds, LCSW Clinical Social Worker [facility]    Patient is participating in a Managed Medicaid Plan:  Yes

## 2023-02-27 ENCOUNTER — Ambulatory Visit (HOSPITAL_BASED_OUTPATIENT_CLINIC_OR_DEPARTMENT_OTHER)
Admission: RE | Admit: 2023-02-27 | Discharge: 2023-02-27 | Disposition: A | Discharge status: Home / Self Care | Payer: PPO | Source: Ambulatory Visit | Attending: Internal Medicine | Admitting: Internal Medicine

## 2023-02-27 ENCOUNTER — Ambulatory Visit: Payer: PPO

## 2023-02-27 DIAGNOSIS — R0603 Acute respiratory distress: Secondary | ICD-10-CM | POA: Diagnosis not present

## 2023-02-27 DIAGNOSIS — K769 Liver disease, unspecified: Secondary | ICD-10-CM | POA: Diagnosis not present

## 2023-02-27 DIAGNOSIS — C3490 Malignant neoplasm of unspecified part of unspecified bronchus or lung: Secondary | ICD-10-CM | POA: Insufficient documentation

## 2023-02-27 DIAGNOSIS — C3492 Malignant neoplasm of unspecified part of left bronchus or lung: Secondary | ICD-10-CM | POA: Diagnosis not present

## 2023-02-27 DIAGNOSIS — R29898 Other symptoms and signs involving the musculoskeletal system: Secondary | ICD-10-CM

## 2023-02-27 DIAGNOSIS — I700 Atherosclerosis of aorta: Secondary | ICD-10-CM | POA: Diagnosis not present

## 2023-02-27 DIAGNOSIS — R2689 Other abnormalities of gait and mobility: Secondary | ICD-10-CM

## 2023-02-27 DIAGNOSIS — J180 Bronchopneumonia, unspecified organism: Secondary | ICD-10-CM | POA: Diagnosis not present

## 2023-02-27 DIAGNOSIS — R262 Difficulty in walking, not elsewhere classified: Secondary | ICD-10-CM

## 2023-02-27 DIAGNOSIS — C7951 Secondary malignant neoplasm of bone: Secondary | ICD-10-CM | POA: Diagnosis not present

## 2023-02-27 DIAGNOSIS — M6281 Muscle weakness (generalized): Secondary | ICD-10-CM

## 2023-02-27 DIAGNOSIS — R2681 Unsteadiness on feet: Secondary | ICD-10-CM

## 2023-02-27 MED ORDER — IOHEXOL 300 MG/ML  SOLN
100.0000 mL | Freq: Once | INTRAMUSCULAR | Status: AC | PRN
  Administered 2023-02-27: 80 mL via INTRAVENOUS

## 2023-02-27 NOTE — Therapy (Signed)
OUTPATIENT PHYSICAL THERAPY NEURO TREATMENT NOTE   Patient Name: Hayley Wu MRN: [id number] DOB:[birth year], 72 y.o., female Today's Date: 02/27/2023   PCP: [name], PA-C REFERRING PROVIDER: [name], PA-C  END OF SESSION:  PT End of Session - 02/27/23 1156     Visit Number 12    Number of Visits 16    Date for PT Re-Evaluation 03/04/23    Authorization Type [employer]    Progress Note Due on Visit --   *PN done at visit 8   PT Start Time 1150    PT Stop Time 1230    PT Time Calculation (min) 40 min    Equipment Utilized During Treatment Gait belt    Activity Tolerance Patient tolerated treatment well    Behavior During Therapy WFL for tasks assessed/performed                Past Medical History:  Diagnosis Date   Allergy    Contact lens/glasses fitting    Hypertension    Past Surgical History:  Procedure Laterality Date   ABDOMINAL HYSTERECTOMY     BREAST SURGERY  2010   rt lumpectomy-neg   IR IMAGING GUIDED PORT INSERTION  12/18/2022   LAMINECTOMY N/A 09/21/2022   Procedure: THORACIC LAMINECTOMY FOR TUMOR;  Surgeon: Tia Alert, MD;  Location: [facility] OR;  Service: Neurosurgery;  Laterality: N/A;   OPEN REDUCTION INTERNAL FIXATION (ORIF) DISTAL RADIAL FRACTURE Left 11/26/2013   Procedure: OPEN REDUCTION INTERNAL FIXATION (ORIF) LEFT  DISTAL RADIUS ;  Surgeon: Nestor Lewandowsky, MD;  Location: [facility];  Service: Orthopedics;  Laterality: Left;  with block   TUBAL LIGATION     Patient Active Problem List   Diagnosis Date Noted   Port-A-Cath in place 12/30/2022   Encounter for antineoplastic immunotherapy 11/18/2022   Encounter for antineoplastic chemotherapy 11/18/2022   Constipation 11/05/2022   Goals of care, counseling/discussion 10/21/2022   Cancer, metastatic to bone (HCC) 10/04/2022   Adenocarcinoma of right lung, stage 4 (HCC) 10/01/2022   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 09/20/2022   Hypokalemia 09/20/2022   Lung mass 09/20/2022   Hypothyroidism 09/20/2022   Cholelithiasis 09/20/2022   Hypertension 09/23/2011    ONSET DATE: April 2024  REFERRING DIAG:  R29.898 (ICD-10-CM) - Other symptoms and signs involving the musculoskeletal system    THERAPY DIAG:  Muscle weakness (generalized)  Other abnormalities of gait and mobility  Unsteadiness on feet  Other symptoms and signs involving the musculoskeletal system  Difficulty in walking, not elsewhere classified  Rationale for Evaluation and Treatment: Rehabilitation  SUBJECTIVE:  SUBJECTIVE STATEMENT: Reports ongoing issue of weakness and general malaise with nausea and decreased appetite  Pt accompanied by: family member  PERTINENT HISTORY:  Stage IV (T1b, N2, M1 C) non-small cell lung cancer, adenocarcinoma presented with right lower lobe lung nodule in addition to small bilateral pulmonary nodules and right hilar and mediastinal lymphadenopathy in addition to liver and bone metastasis diagnosed in April 2024. 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 She is currently undergoing palliative systemic chemotherapy and immunotherapy.  PAIN:  Are you having pain? Yes: NPRS scale: 3-4/10 Pain location: mid to low back Pain description: sore Aggravating factors: certain positions/prolonged sitting Relieving factors: change of position  PRECAUTIONS: Fall  RED FLAGS: None   WEIGHT BEARING RESTRICTIONS: No  FALLS: Has patient fallen in last 6 months? Yes. Number of falls 3  LIVING  ENVIRONMENT: Lives with: lives with their family Lives in: House/apartment Stairs: No Has following equipment at home: Environmental consultant - 2 wheeled and Wheelchair (manual)  PLOF: Independent  PATIENT GOALS: improve mobility to return to walking  OBJECTIVE:   TODAY'S TREATMENT: 02/27/23 Activity Comments  Seated LE PRE 3x10 reps 5# LAQ Hip add iso Hip abd iso Hip flexion  NU-step varying resistance levels x 8 min For general conditioning 30 sec efforts  Seated unilat row 3x10 10-15#  Seated lat row 2x10 20#              PATIENT EDUCATION: Education details: Continuing current HEP Person educated: Patient Education method: Programmer, multimedia, Facilities manager, Actor cues, Verbal cues, and Handouts Education comprehension: verbalized understanding and returned demonstration   HOME EXERCISE PROGRAM Last updated: 02/04/23 Access Code: [email or web address] URL: [email or web address] Date: 02/04/2023 Prepared by: [facility] Outpatient  Rehab - [facility]  Program Notes perform standing activities at counter top and chair behihnd for safety. start with family supervision first.   Exercises - Seated Hamstring Stretch  - 1-2 x daily - 7 x weekly - 1 sets - 3 reps - 15-30 sec hold - Seated Gastroc Stretch with Strap  - 1-2 x daily - 7 x weekly - 1 sets - 3 reps - 15-30 sec hold - Standing March with Counter Support  - 1 x daily - 5 x weekly - 2 sets - 10 reps - Mini Squat with Counter Support  - 1 x daily - 5 x weekly - 2 sets - 10 reps - Seated Long Arc Quad  - 1 x daily - 5 x weekly - 2 sets - 10 reps  ------------------------------------------------------------------------------------------- DIAGNOSTIC FINDINGS:  MPRESSION: 1. No acute intracranial abnormality. No evidence for intracranial metastatic disease on this noncontrast examination. 2. Moderately advanced chronic microvascular ischemic disease.  IMPRESSION: 1. Widespread osseous metastatic disease involving the  thoracic spine as above. Associated pathologic compression fractures of T6, T7, T8, and T12. Fracture most severe at T7 where there is associated vertebral plana formation. Adjacent epidural extension as above with resultant severe spinal stenosis and cord compression at T7. Emergent neuro surgical consultation recommended. 2. Few scattered probable pulmonary nodules within the partially visualized right lung, concerning for metastatic disease. 3. Possible cholelithiasis.  COGNITION: Overall cognitive status: Within functional limits for tasks assessed   SENSATION: Decreased light touch BLE  COORDINATION: Impaired by LE weakness  EDEMA:  Bilat ankles, non-pitting  MUSCLE TONE: RLE: Hypotonic  MUSCLE LENGTH: WFL  DTRs:  NT  POSTURE: No Significant postural limitations  LOWER EXTREMITY ROM:  Active  Right Eval Left Eval  Hip flexion    Hip extension    Hip abduction    Hip adduction    Hip internal rotation    Hip external rotation    Knee flexion 115 120  Knee extension 0 0  Ankle dorsiflexion 10 10  Ankle plantarflexion    Ankle inversion    Ankle eversion     (Blank rows = not tested)  LOWER EXTREMITY MMT:    MMT Right Eval Left Eval  Hip flexion 3- 3+  Hip extension    Hip abduction 3- 3  Hip adduction 3 3  Hip internal rotation    Hip external rotation    Knee flexion 3 3+  Knee extension 3- 3  Ankle dorsiflexion 2+ 3+  Ankle plantarflexion    Ankle inversion    Ankle eversion    (Blank rows = not tested)  BED MOBILITY:  Mod A--pt reports using lift-chair recliner for sleeping at this time  TRANSFERS: Assistive device utilized: Environmental consultant - 2 wheeled  Sit to stand: SBA and CGA Stand to sit: SBA and CGA Chair to chair: SBA and CGA Floor:  NT    CURB:  Level of Assistance:  NT due to fear/anxiety from recent falls Assistive device utilized: Environmental consultant - 2 wheeled Curb Comments:   STAIRS: Level of Assistance:  NT due to deficits and  fear  GAIT: Gait pattern: Right foot flat, Left foot flat, genu recurvatum- Right, and genu recurvatum- Left Distance walked: 40 ft Assistive device utilized: Environmental consultant - 2 wheeled Level of assistance: CGA and Min A Comments: pt reports she was provided AFO at hospital for right ankle--does not have it with her today  FUNCTIONAL TESTS:  5 times sit to stand: requires use of BUE Timed up and go (TUG): 62 sec w/ RW Static standing: Fair balance x 10 sec    PATIENT EDUCATION: Education details: trials of standing at kitchen sink with couch cushion to block knees to prevent buckling and to reduce need for knee hyperextension Person educated: Patient and Child(ren) Education method: Explanation Education comprehension: verbalized understanding  HOME EXERCISE PROGRAM: TBD  GOALS: Goals reviewed with patient? Yes  SHORT TERM GOALS: Target date: 01/22/2023    Patient will be independent in HEP to improve functional outcomes Baseline:formal HEP initiated 01/21/2023 Goal status: IN PROGRESS  2.  Demo reduced risk for falls per TUG test time 35 sec  Baseline: 62 sec w/ RW>61 sec 01/21/2023; 53.9 sec with RW 02/04/23 Goal status: NOT MET 02/04/23    LONG TERM GOALS: Target date: 03/04/2023    Demo supervision ambulation x 75 ft w/ RW with multiple turns to improve safety with household ambulation Baseline: CGA-min A x 40 ft; 89 ft with supervision 02/04/23 Goal status: MET 02/04/23  2.  Demo improved BLE strength as evidenced by 30 sec 5xSTS with minimal use of hands Baseline: unable; 12.18 sec with heavy use of UEs 02/04/23 Goal status: IN PROGRESS 02/04/23  3.  Improve functional ambulation and safety with ability to negotiate 1-2 steps with HR to improve household navigation Baseline: unable; 3 stairs with B handrail and CGA-min A 02/04/23 Goal status: IN PROGRESS 02/04/23  4.  Demo improved safety and mobility negotiating up/down curb w/ CGA and AD Baseline: unable; NT d/t safety  02/04/23 Goal status: IN PROGRESS  02/04/23   5. Demo supervision ambulation x 150 ft w/ RW with multiple turns to improve safety with household ambulation Baseline: 89 ft with  RW Goal status: INITIAL 02/04/23    ASSESSMENT:  CLINICAL IMPRESSION: Pt notes continued general malaise, nausea, and subsequent fatigue which impacted performance today requiring less effortful activities and activities from sitting due to generalized weakness.  Continued with general strength and conditioning activities to improve strength and functional performance.  Demonstrating greater right tib anterior strength of 3 to 3+/5 noted today. Will perform re-assessment at next visit to determine course of action  OBJECTIVE IMPAIRMENTS: Abnormal gait, decreased activity tolerance, decreased balance, decreased coordination, decreased knowledge of use of DME, decreased mobility, difficulty walking, decreased strength, and impaired tone.   ACTIVITY LIMITATIONS: carrying, lifting, bending, standing, stairs, transfers, bed mobility, and locomotion level  PARTICIPATION LIMITATIONS: meal prep, cleaning, laundry, shopping, and community activity  PERSONAL FACTORS: Age, Time since onset of injury/illness/exacerbation, and 1-2 comorbidities: PMH  are also affecting patient's functional outcome.   REHAB POTENTIAL: Good  CLINICAL DECISION MAKING: Evolving/moderate complexity  EVALUATION COMPLEXITY: Moderate  PLAN:  PT FREQUENCY: 2x/week  PT DURATION: 4 weeks  PLANNED INTERVENTIONS: Therapeutic exercises, Therapeutic activity, Neuromuscular re-education, Balance training, Gait training, Patient/Family education, Self Care, Joint mobilization, Stair training, Vestibular training, Canalith repositioning, Orthotic/Fit training, DME instructions, Aquatic Therapy, Cryotherapy, Moist heat, Taping, and Manual therapy  PLAN FOR NEXT SESSION:re-assessment/recertification.     11:56 AM, 02/27/23 M. Shary Decamp, PT,  DPT Physical Therapist- [place] Office Number: [phone]

## 2023-02-28 ENCOUNTER — Other Ambulatory Visit: Payer: Self-pay

## 2023-02-28 ENCOUNTER — Telehealth: Payer: Self-pay

## 2023-02-28 ENCOUNTER — Other Ambulatory Visit: Payer: Self-pay | Admitting: Physician Assistant

## 2023-02-28 DIAGNOSIS — I82419 Acute embolism and thrombosis of unspecified femoral vein: Secondary | ICD-10-CM

## 2023-02-28 MED ORDER — APIXABAN (ELIQUIS) VTE STARTER PACK (10MG AND 5MG)
ORAL_TABLET | ORAL | 0 refills | Status: DC
Start: 2023-02-28 — End: 2023-03-25

## 2023-02-28 MED FILL — Dexamethasone Sodium Phosphate Inj 100 MG/10ML: INTRAMUSCULAR | Qty: 1 | Status: AC

## 2023-02-28 NOTE — Telephone Encounter (Signed)
I called this pt to let her know that PA-C Cassie had called her Eliquis in to her pharmacy and for her to pick up and start today. I let her she  is not to take NAIDS, I also let her know that if she start having PE  SX: like chest pains,SOB,syncope, or light headiness for  her to go to the ED. Pt verbalized understanding. I let her know if she has any questions for her to call the [facility].  Courtney Bellizzi M. CMA

## 2023-03-01 ENCOUNTER — Inpatient Hospital Stay (HOSPITAL_COMMUNITY)
Admission: EM | Admit: 2023-03-01 | Discharge: 2023-03-25 | DRG: 193 | Disposition: A | Discharge status: Home Health | Payer: PPO | Attending: Internal Medicine | Admitting: Internal Medicine

## 2023-03-01 ENCOUNTER — Other Ambulatory Visit: Payer: Self-pay

## 2023-03-01 ENCOUNTER — Inpatient Hospital Stay (HOSPITAL_COMMUNITY): Payer: PPO

## 2023-03-01 ENCOUNTER — Encounter (HOSPITAL_COMMUNITY): Payer: Self-pay

## 2023-03-01 ENCOUNTER — Emergency Department (HOSPITAL_COMMUNITY): Payer: PPO

## 2023-03-01 DIAGNOSIS — J302 Other seasonal allergic rhinitis: Secondary | ICD-10-CM | POA: Diagnosis present

## 2023-03-01 DIAGNOSIS — I10 Essential (primary) hypertension: Secondary | ICD-10-CM | POA: Diagnosis present

## 2023-03-01 DIAGNOSIS — I2699 Other pulmonary embolism without acute cor pulmonale: Secondary | ICD-10-CM | POA: Diagnosis present

## 2023-03-01 DIAGNOSIS — T501X5A Adverse effect of loop [high-ceiling] diuretics, initial encounter: Secondary | ICD-10-CM | POA: Diagnosis present

## 2023-03-01 DIAGNOSIS — Y842 Radiological procedure and radiotherapy as the cause of abnormal reaction of the patient, or of later complication, without mention of misadventure at the time of the procedure: Secondary | ICD-10-CM | POA: Diagnosis present

## 2023-03-01 DIAGNOSIS — E039 Hypothyroidism, unspecified: Secondary | ICD-10-CM | POA: Diagnosis not present

## 2023-03-01 DIAGNOSIS — K5900 Constipation, unspecified: Secondary | ICD-10-CM | POA: Diagnosis present

## 2023-03-01 DIAGNOSIS — C7951 Secondary malignant neoplasm of bone: Secondary | ICD-10-CM | POA: Diagnosis present

## 2023-03-01 DIAGNOSIS — I270 Primary pulmonary hypertension: Secondary | ICD-10-CM | POA: Diagnosis present

## 2023-03-01 DIAGNOSIS — Z7189 Other specified counseling: Secondary | ICD-10-CM | POA: Diagnosis not present

## 2023-03-01 DIAGNOSIS — Z1152 Encounter for screening for COVID-19: Secondary | ICD-10-CM

## 2023-03-01 DIAGNOSIS — I509 Heart failure, unspecified: Secondary | ICD-10-CM | POA: Diagnosis not present

## 2023-03-01 DIAGNOSIS — Z6826 Body mass index (BMI) 26.0-26.9, adult: Secondary | ICD-10-CM

## 2023-03-01 DIAGNOSIS — R509 Fever, unspecified: Secondary | ICD-10-CM | POA: Diagnosis not present

## 2023-03-01 DIAGNOSIS — C3491 Malignant neoplasm of unspecified part of right bronchus or lung: Secondary | ICD-10-CM | POA: Diagnosis present

## 2023-03-01 DIAGNOSIS — Z881 Allergy status to other antibiotic agents status: Secondary | ICD-10-CM

## 2023-03-01 DIAGNOSIS — J180 Bronchopneumonia, unspecified organism: Principal | ICD-10-CM | POA: Diagnosis present

## 2023-03-01 DIAGNOSIS — I5031 Acute diastolic (congestive) heart failure: Secondary | ICD-10-CM | POA: Diagnosis not present

## 2023-03-01 DIAGNOSIS — E440 Moderate protein-calorie malnutrition: Secondary | ICD-10-CM | POA: Diagnosis present

## 2023-03-01 DIAGNOSIS — J9601 Acute respiratory failure with hypoxia: Secondary | ICD-10-CM | POA: Diagnosis present

## 2023-03-01 DIAGNOSIS — E876 Hypokalemia: Secondary | ICD-10-CM | POA: Diagnosis present

## 2023-03-01 DIAGNOSIS — Z86718 Personal history of other venous thrombosis and embolism: Secondary | ICD-10-CM

## 2023-03-01 DIAGNOSIS — Z7901 Long term (current) use of anticoagulants: Secondary | ICD-10-CM

## 2023-03-01 DIAGNOSIS — I517 Cardiomegaly: Secondary | ICD-10-CM | POA: Diagnosis not present

## 2023-03-01 DIAGNOSIS — D649 Anemia, unspecified: Secondary | ICD-10-CM | POA: Diagnosis not present

## 2023-03-01 DIAGNOSIS — J439 Emphysema, unspecified: Secondary | ICD-10-CM | POA: Diagnosis not present

## 2023-03-01 DIAGNOSIS — C787 Secondary malignant neoplasm of liver and intrahepatic bile duct: Secondary | ICD-10-CM | POA: Diagnosis not present

## 2023-03-01 DIAGNOSIS — J9600 Acute respiratory failure, unspecified whether with hypoxia or hypercapnia: Secondary | ICD-10-CM | POA: Diagnosis not present

## 2023-03-01 DIAGNOSIS — Z90710 Acquired absence of both cervix and uterus: Secondary | ICD-10-CM

## 2023-03-01 DIAGNOSIS — R54 Age-related physical debility: Secondary | ICD-10-CM | POA: Diagnosis present

## 2023-03-01 DIAGNOSIS — J984 Other disorders of lung: Secondary | ICD-10-CM | POA: Diagnosis not present

## 2023-03-01 DIAGNOSIS — D630 Anemia in neoplastic disease: Secondary | ICD-10-CM | POA: Diagnosis not present

## 2023-03-01 DIAGNOSIS — J811 Chronic pulmonary edema: Secondary | ICD-10-CM | POA: Diagnosis not present

## 2023-03-01 DIAGNOSIS — I2782 Chronic pulmonary embolism: Secondary | ICD-10-CM | POA: Diagnosis not present

## 2023-03-01 DIAGNOSIS — I959 Hypotension, unspecified: Secondary | ICD-10-CM | POA: Diagnosis not present

## 2023-03-01 DIAGNOSIS — I110 Hypertensive heart disease with heart failure: Secondary | ICD-10-CM | POA: Diagnosis not present

## 2023-03-01 DIAGNOSIS — R0603 Acute respiratory distress: Principal | ICD-10-CM

## 2023-03-01 DIAGNOSIS — I82412 Acute embolism and thrombosis of left femoral vein: Secondary | ICD-10-CM | POA: Diagnosis not present

## 2023-03-01 DIAGNOSIS — Z888 Allergy status to other drugs, medicaments and biological substances status: Secondary | ICD-10-CM

## 2023-03-01 DIAGNOSIS — R5383 Other fatigue: Secondary | ICD-10-CM | POA: Diagnosis not present

## 2023-03-01 DIAGNOSIS — R609 Edema, unspecified: Secondary | ICD-10-CM | POA: Diagnosis not present

## 2023-03-01 DIAGNOSIS — Z993 Dependence on wheelchair: Secondary | ICD-10-CM

## 2023-03-01 DIAGNOSIS — Z66 Do not resuscitate: Secondary | ICD-10-CM | POA: Diagnosis present

## 2023-03-01 DIAGNOSIS — I82413 Acute embolism and thrombosis of femoral vein, bilateral: Secondary | ICD-10-CM

## 2023-03-01 DIAGNOSIS — Z4682 Encounter for fitting and adjustment of non-vascular catheter: Secondary | ICD-10-CM | POA: Diagnosis not present

## 2023-03-01 DIAGNOSIS — I2609 Other pulmonary embolism with acute cor pulmonale: Secondary | ICD-10-CM | POA: Diagnosis not present

## 2023-03-01 DIAGNOSIS — R918 Other nonspecific abnormal finding of lung field: Secondary | ICD-10-CM | POA: Diagnosis not present

## 2023-03-01 DIAGNOSIS — K769 Liver disease, unspecified: Secondary | ICD-10-CM | POA: Diagnosis present

## 2023-03-01 DIAGNOSIS — Z880 Allergy status to penicillin: Secondary | ICD-10-CM

## 2023-03-01 DIAGNOSIS — R0989 Other specified symptoms and signs involving the circulatory and respiratory systems: Secondary | ICD-10-CM | POA: Diagnosis not present

## 2023-03-01 DIAGNOSIS — Z515 Encounter for palliative care: Secondary | ICD-10-CM | POA: Diagnosis not present

## 2023-03-01 DIAGNOSIS — J90 Pleural effusion, not elsewhere classified: Secondary | ICD-10-CM | POA: Diagnosis not present

## 2023-03-01 DIAGNOSIS — R531 Weakness: Secondary | ICD-10-CM | POA: Diagnosis not present

## 2023-03-01 DIAGNOSIS — F17210 Nicotine dependence, cigarettes, uncomplicated: Secondary | ICD-10-CM | POA: Diagnosis present

## 2023-03-01 DIAGNOSIS — Z9851 Tubal ligation status: Secondary | ICD-10-CM

## 2023-03-01 DIAGNOSIS — Z91040 Latex allergy status: Secondary | ICD-10-CM

## 2023-03-01 DIAGNOSIS — Z79899 Other long term (current) drug therapy: Secondary | ICD-10-CM

## 2023-03-01 DIAGNOSIS — R0902 Hypoxemia: Secondary | ICD-10-CM | POA: Diagnosis not present

## 2023-03-01 DIAGNOSIS — C3490 Malignant neoplasm of unspecified part of unspecified bronchus or lung: Secondary | ICD-10-CM | POA: Diagnosis not present

## 2023-03-01 DIAGNOSIS — Z8249 Family history of ischemic heart disease and other diseases of the circulatory system: Secondary | ICD-10-CM

## 2023-03-01 DIAGNOSIS — Z87440 Personal history of urinary (tract) infections: Secondary | ICD-10-CM

## 2023-03-01 DIAGNOSIS — Z79890 Hormone replacement therapy (postmenopausal): Secondary | ICD-10-CM

## 2023-03-01 LAB — CBC WITH DIFFERENTIAL/PLATELET
Abs Immature Granulocytes: 0.09 10*3/uL — ABNORMAL HIGH (ref 0.00–0.07)
Basophils Absolute: 0 10*3/uL (ref 0.0–0.1)
Basophils Relative: 0 %
Eosinophils Absolute: 0.1 10*3/uL (ref 0.0–0.5)
Eosinophils Relative: 1 %
HCT: 34 % — ABNORMAL LOW (ref 36.0–46.0)
Hemoglobin: 10.8 g/dL — ABNORMAL LOW (ref 12.0–15.0)
Immature Granulocytes: 1 %
Lymphocytes Relative: 6 %
Lymphs Abs: 0.4 10*3/uL — ABNORMAL LOW (ref 0.7–4.0)
MCH: 30.4 pg (ref 26.0–34.0)
MCHC: 31.8 g/dL (ref 30.0–36.0)
MCV: 95.8 fL (ref 80.0–100.0)
Monocytes Absolute: 0.6 10*3/uL (ref 0.1–1.0)
Monocytes Relative: 8 %
Neutro Abs: 6.3 10*3/uL (ref 1.7–7.7)
Neutrophils Relative %: 84 %
Platelets: 234 10*3/uL (ref 150–400)
RBC: 3.55 MIL/uL — ABNORMAL LOW (ref 3.87–5.11)
RDW: 14.6 % (ref 11.5–15.5)
WBC: 7.5 10*3/uL (ref 4.0–10.5)
nRBC: 0.4 % — ABNORMAL HIGH (ref 0.0–0.2)

## 2023-03-01 LAB — URINALYSIS, W/ REFLEX TO CULTURE (INFECTION SUSPECTED)
Bilirubin Urine: NEGATIVE
Glucose, UA: NEGATIVE mg/dL
Hgb urine dipstick: NEGATIVE
Ketones, ur: NEGATIVE mg/dL
Nitrite: NEGATIVE
Protein, ur: NEGATIVE mg/dL
Specific Gravity, Urine: >1.046 — ABNORMAL HIGH (ref 1.005–1.030)
WBC, UA: >50 WBC/hpf (ref 0–5)
pH: 5 (ref 5.0–8.0)

## 2023-03-01 LAB — COMPREHENSIVE METABOLIC PANEL
ALT: 18 U/L (ref 0–44)
AST: 20 U/L (ref 15–41)
Albumin: 2.8 g/dL — ABNORMAL LOW (ref 3.5–5.0)
Alkaline Phosphatase: 79 U/L (ref 38–126)
Anion gap: 11 (ref 5–15)
BUN: 23 mg/dL (ref 8–23)
CO2: 24 mmol/L (ref 22–32)
Calcium: 8.1 mg/dL — ABNORMAL LOW (ref 8.9–10.3)
Chloride: 102 mmol/L (ref 98–111)
Creatinine, Ser: 0.52 mg/dL (ref 0.44–1.00)
GFR, Estimated: >60 mL/min (ref >60)
Glucose, Bld: 101 mg/dL — ABNORMAL HIGH (ref 70–99)
Potassium: 3 mmol/L — ABNORMAL LOW (ref 3.5–5.1)
Sodium: 137 mmol/L (ref 135–145)
Total Bilirubin: 1 mg/dL (ref 0.3–1.2)
Total Protein: 6 g/dL — ABNORMAL LOW (ref 6.5–8.1)

## 2023-03-01 LAB — MAGNESIUM: Magnesium: 2.2 mg/dL (ref 1.7–2.4)

## 2023-03-01 LAB — BRAIN NATRIURETIC PEPTIDE: B Natriuretic Peptide: 162.3 pg/mL — ABNORMAL HIGH (ref 0.0–100.0)

## 2023-03-01 LAB — RESP PANEL BY RT-PCR (RSV, FLU A&B, COVID)  RVPGX2
Influenza A by PCR: NEGATIVE
Influenza B by PCR: NEGATIVE
Resp Syncytial Virus by PCR: NEGATIVE
SARS Coronavirus 2 by RT PCR: NEGATIVE

## 2023-03-01 LAB — APTT: aPTT: 26 seconds (ref 24–36)

## 2023-03-01 LAB — I-STAT CG4 LACTIC ACID, ED: Lactic Acid, Venous: 0.9 mmol/L (ref 0.5–1.9)

## 2023-03-01 LAB — PROTIME-INR
INR: 1.2 (ref 0.8–1.2)
Prothrombin Time: 15.3 seconds — ABNORMAL HIGH (ref 11.4–15.2)

## 2023-03-01 LAB — PROCALCITONIN: Procalcitonin: <0.1 ng/mL

## 2023-03-01 LAB — STREP PNEUMONIAE URINARY ANTIGEN: Strep Pneumo Urinary Antigen: NEGATIVE

## 2023-03-01 MED ORDER — CHLORHEXIDINE GLUCONATE CLOTH 2 % EX PADS
6.0000 | MEDICATED_PAD | Freq: Every day | CUTANEOUS | Status: DC
  Administered 2023-03-02 – 2023-03-24 (×21): 6 via TOPICAL

## 2023-03-01 MED ORDER — SODIUM CHLORIDE 0.9 % IV BOLUS: 500.0000 mL | Freq: Once | INTRAVENOUS | Status: DC

## 2023-03-01 MED ORDER — ORAL CARE MOUTH RINSE: 15.0000 mL | OROMUCOSAL | Status: DC | PRN

## 2023-03-01 MED ORDER — ONDANSETRON HCL 4 MG PO TABS: 4.0000 mg | ORAL_TABLET | Freq: Four times a day (QID) | ORAL | Status: DC | PRN

## 2023-03-01 MED ORDER — APIXABAN 5 MG PO TABS
10.0000 mg | ORAL_TABLET | Freq: Two times a day (BID) | ORAL | Status: AC
  Administered 2023-03-01 – 2023-03-08 (×14): 10 mg via ORAL
  Filled 2023-03-01 (×14): qty 2

## 2023-03-01 MED ORDER — POTASSIUM CHLORIDE CRYS ER 20 MEQ PO TBCR
40.0000 meq | EXTENDED_RELEASE_TABLET | Freq: Once | ORAL | Status: AC
  Administered 2023-03-01: 40 meq via ORAL
  Filled 2023-03-01: qty 2

## 2023-03-01 MED ORDER — SODIUM CHLORIDE 0.9 % IV SOLN
500.0000 mg | INTRAVENOUS | Status: DC
  Administered 2023-03-01 – 2023-03-03 (×3): 500 mg via INTRAVENOUS
  Filled 2023-03-01 (×4): qty 5

## 2023-03-01 MED ORDER — ACETAMINOPHEN 325 MG PO TABS
650.0000 mg | ORAL_TABLET | Freq: Four times a day (QID) | ORAL | Status: DC | PRN
  Filled 2023-03-01: qty 2

## 2023-03-01 MED ORDER — ALBUMIN HUMAN 25 % IV SOLN
50.0000 g | Freq: Once | INTRAVENOUS | Status: AC
  Administered 2023-03-01: 50 g via INTRAVENOUS
  Filled 2023-03-01: qty 200

## 2023-03-01 MED ORDER — MIDODRINE HCL 5 MG PO TABS: 10.0000 mg | ORAL_TABLET | Freq: Once | ORAL | Status: DC

## 2023-03-01 MED ORDER — LACTATED RINGERS IV SOLN: INTRAVENOUS | Status: AC

## 2023-03-01 MED ORDER — ENOXAPARIN SODIUM 40 MG/0.4ML IJ SOSY: 40.0000 mg | PREFILLED_SYRINGE | INTRAMUSCULAR | Status: DC

## 2023-03-01 MED ORDER — ALBUMIN HUMAN 5 % IV SOLN
25.0000 g | Freq: Once | INTRAVENOUS | Status: AC
  Administered 2023-03-01: 25 g via INTRAVENOUS
  Filled 2023-03-01: qty 500

## 2023-03-01 MED ORDER — MIDODRINE HCL 5 MG PO TABS
10.0000 mg | ORAL_TABLET | Freq: Once | ORAL | Status: AC
  Administered 2023-03-01: 10 mg via ORAL
  Filled 2023-03-01: qty 2

## 2023-03-01 MED ORDER — LEVOTHYROXINE SODIUM 100 MCG PO TABS
100.0000 ug | ORAL_TABLET | Freq: Every day | ORAL | Status: DC
  Administered 2023-03-02 – 2023-03-25 (×24): 100 ug via ORAL
  Filled 2023-03-01 (×25): qty 1

## 2023-03-01 MED ORDER — SODIUM CHLORIDE 0.9 % IV BOLUS
500.0000 mL | Freq: Once | INTRAVENOUS | Status: AC
  Administered 2023-03-01: 500 mL via INTRAVENOUS

## 2023-03-01 MED ORDER — ACETAMINOPHEN 325 MG PO TABS
650.0000 mg | ORAL_TABLET | Freq: Once | ORAL | Status: AC
  Administered 2023-03-01: 650 mg via ORAL
  Filled 2023-03-01: qty 2

## 2023-03-01 MED ORDER — MIRTAZAPINE 15 MG PO TABS
30.0000 mg | ORAL_TABLET | Freq: Every day | ORAL | Status: DC
  Administered 2023-03-01 – 2023-03-24 (×24): 30 mg via ORAL
  Filled 2023-03-01 (×24): qty 2

## 2023-03-01 MED ORDER — FOLIC ACID 1 MG PO TABS
1.0000 mg | ORAL_TABLET | Freq: Every day | ORAL | Status: DC
  Administered 2023-03-01 – 2023-03-25 (×25): 1 mg via ORAL
  Filled 2023-03-01 (×25): qty 1

## 2023-03-01 MED ORDER — ACETAMINOPHEN 650 MG RE SUPP: 650.0000 mg | Freq: Four times a day (QID) | RECTAL | Status: DC | PRN

## 2023-03-01 MED ORDER — APIXABAN 5 MG PO TABS
5.0000 mg | ORAL_TABLET | Freq: Two times a day (BID) | ORAL | Status: DC
  Administered 2023-03-08 – 2023-03-25 (×34): 5 mg via ORAL
  Filled 2023-03-01 (×34): qty 1

## 2023-03-01 MED ORDER — ONDANSETRON HCL 4 MG/2ML IJ SOLN: 4.0000 mg | Freq: Four times a day (QID) | INTRAMUSCULAR | Status: DC | PRN

## 2023-03-01 MED ORDER — SODIUM CHLORIDE 0.9 % IV SOLN
1.0000 g | INTRAVENOUS | Status: DC
  Administered 2023-03-01 – 2023-03-04 (×4): 1 g via INTRAVENOUS
  Filled 2023-03-01 (×5): qty 10

## 2023-03-01 MED ORDER — IOHEXOL 350 MG/ML SOLN
75.0000 mL | Freq: Once | INTRAVENOUS | Status: AC | PRN
  Administered 2023-03-01: 75 mL via INTRAVENOUS

## 2023-03-01 NOTE — Discharge Instructions (Addendum)

## 2023-03-01 NOTE — ED Notes (Addendum)
ED TO INPATIENT HANDOFF REPORT  Name/Age/Gender Hayley Wu 72 y.o. female  Code Status Code Status History     Date Active Date Inactive Code Status Order ID Comments User Context   12/18/2022 1444 12/19/2022 0509 Full Code [id number]  Berdine Dance, MD [facility]   09/20/2022 2117 09/27/2022 2358 Full Code [id number]  Orland Mustard, MD ED   11/26/2013 1051 11/26/2013 1538 Full Code [id number]  Allena Katz, PA-C Inpatient    Questions for Most Recent Historical Code Status (Order [id number])     Question Answer   By: Other            Home/SNF/Other Home  Chief Complaint Acute respiratory failure with hypoxia (HCC) [J96.01]  Level of Care/Admitting Diagnosis ED Disposition     ED Disposition  Admit   Condition  --   Comment  Hospital Area: [facility] [[id number]]  Level of Care: Progressive [102]  Admit to Progressive based on following criteria: RESPIRATORY PROBLEMS hypoxemic/hypercapnic respiratory failure that is responsive to NIPPV (BiPAP) or High Flow Nasal Cannula (6-80 lpm). Frequent assessment/intervention, no > Q2 hrs < Q4 hrs, to maintain oxygenation and pulmonary hygiene.  Admit to Progressive based on following criteria: MULTISYSTEM THREATS such as stable sepsis, metabolic/electrolyte imbalance with or without encephalopathy that is responding to early treatment.  May admit patient to [facility] or [facility] if equivalent level of care is available:: No  Covid Evaluation: Asymptomatic - no recent exposure (last 10 days) testing not required  Diagnosis: Acute respiratory failure with hypoxia [facility]) [[id number]]  Admitting Physician: Bobette Mo [[id number]]  Attending Physician: Bobette Mo [[id number]]  Certification:: I certify this patient will need inpatient services for at least 2 midnights  Expected Medical Readiness: 03/04/2023          Medical History Past Medical History:  Diagnosis Date   Allergy    Contact lens/glasses  fitting    Hypertension     Allergies Allergies  Allergen Reactions   Erythromycin Diarrhea   Amoxicillin Rash   Latex Rash   Prednisone Rash    IV Location/Drains/Wounds Patient Lines/Drains/Airways Status     Active Line/Drains/Airways     Name Placement date Placement time Site Days   Implanted Port 12/18/22 Right Chest 12/18/22  1334  Chest  73   Peripheral IV 03/01/23 20 G 1" Left Antecubital 03/01/23  1030  Antecubital  less than 1            Labs/Imaging Results for orders placed or performed during the hospital encounter of 03/01/23 (from the past 48 hour(s))  Resp panel by RT-PCR (RSV, Flu A&B, Covid) Anterior Nasal Swab     Status: None   Collection Time: 03/01/23  9:27 AM   Specimen: Anterior Nasal Swab  Result Value Ref Range   SARS Coronavirus 2 by RT PCR NEGATIVE NEGATIVE    Comment: (NOTE) SARS-CoV-2 target nucleic acids are NOT DETECTED.  The SARS-CoV-2 RNA is generally detectable in upper respiratory specimens during the acute phase of infection. The lowest concentration of SARS-CoV-2 viral copies this assay can detect is 138 copies/mL. A negative result does not preclude SARS-Cov-2 infection and should not be used as the sole basis for treatment or other patient management decisions. A negative result may occur with  improper specimen collection/handling, submission of specimen other than nasopharyngeal swab, presence of viral mutation(s) within the areas targeted by this assay, and inadequate number of viral copies(<138 copies/mL). A negative result must be combined with  clinical observations, patient history, and epidemiological information. The expected result is Negative.  Fact Sheet for Patients:  BloggerCourse.com  Fact Sheet for Healthcare Providers:  SeriousBroker.it  This test is no t yet approved or cleared by the [place] FDA and  has been authorized for detection and/or  diagnosis of SARS-CoV-2 by FDA under an Emergency Use Authorization (EUA). This EUA will remain  in effect (meaning this test can be used) for the duration of the COVID-19 declaration under Section 564(b)(1) of the Act, 21 U.S.C.section 360bbb-3(b)(1), unless the authorization is terminated  or revoked sooner.       Influenza A by PCR NEGATIVE NEGATIVE   Influenza B by PCR NEGATIVE NEGATIVE    Comment: (NOTE) The Xpert Xpress SARS-CoV-2/FLU/RSV plus assay is intended as an aid in the diagnosis of influenza from Nasopharyngeal swab specimens and should not be used as a sole basis for treatment. Nasal washings and aspirates are unacceptable for Xpert Xpress SARS-CoV-2/FLU/RSV testing.  Fact Sheet for Patients: BloggerCourse.com  Fact Sheet for Healthcare Providers: SeriousBroker.it  This test is not yet approved or cleared by the [place] FDA and has been authorized for detection and/or diagnosis of SARS-CoV-2 by FDA under an Emergency Use Authorization (EUA). This EUA will remain in effect (meaning this test can be used) for the duration of the COVID-19 declaration under Section 564(b)(1) of the Act, 21 U.S.C. section 360bbb-3(b)(1), unless the authorization is terminated or revoked.     Resp Syncytial Virus by PCR NEGATIVE NEGATIVE    Comment: (NOTE) Fact Sheet for Patients: BloggerCourse.com  Fact Sheet for Healthcare Providers: SeriousBroker.it  This test is not yet approved or cleared by the [place] FDA and has been authorized for detection and/or diagnosis of SARS-CoV-2 by FDA under an Emergency Use Authorization (EUA). This EUA will remain in effect (meaning this test can be used) for the duration of the COVID-19 declaration under Section 564(b)(1) of the Act, 21 U.S.C. section 360bbb-3(b)(1), unless the authorization is terminated or revoked.  Performed  at [facility], [address], [place], [place] [address]   Comprehensive metabolic panel     Status: Abnormal   Collection Time: 03/01/23  9:27 AM  Result Value Ref Range   Sodium 137 135 - 145 mmol/L   Potassium 3.0 (L) 3.5 - 5.1 mmol/L   Chloride 102 98 - 111 mmol/L   CO2 24 22 - 32 mmol/L   Glucose, Bld 101 (H) 70 - 99 mg/dL    Comment: Glucose reference range applies only to samples taken after fasting for at least 8 hours.   BUN 23 8 - 23 mg/dL   Creatinine, Ser 8.24 0.44 - 1.00 mg/dL   Calcium 8.1 (L) 8.9 - 10.3 mg/dL   Total Protein 6.0 (L) 6.5 - 8.1 g/dL   Albumin 2.8 (L) 3.5 - 5.0 g/dL   AST 20 15 - 41 U/L   ALT 18 0 - 44 U/L   Alkaline Phosphatase 79 38 - 126 U/L   Total Bilirubin 1.0 0.3 - 1.2 mg/dL   GFR, Estimated >23 >53 mL/min    Comment: (NOTE) Calculated using the CKD-EPI Creatinine Equation (2021)    Anion gap 11 5 - 15    Comment: Performed at [facility], [address], [place], [place] [address]  CBC with Differential     Status: Abnormal   Collection Time: 03/01/23  9:27 AM  Result Value Ref Range   WBC 7.5 4.0 - 10.5 K/uL  RBC 3.55 (L) 3.87 - 5.11 MIL/uL   Hemoglobin 10.8 (L) 12.0 - 15.0 g/dL   HCT 40.9 (L) 81.1 - 91.4 %   MCV 95.8 80.0 - 100.0 fL   MCH 30.4 26.0 - 34.0 pg   MCHC 31.8 30.0 - 36.0 g/dL   RDW 78.2 95.6 - 21.3 %   Platelets 234 150 - 400 K/uL   nRBC 0.4 (H) 0.0 - 0.2 %   Neutrophils Relative % 84 %   Neutro Abs 6.3 1.7 - 7.7 K/uL   Lymphocytes Relative 6 %   Lymphs Abs 0.4 (L) 0.7 - 4.0 K/uL   Monocytes Relative 8 %   Monocytes Absolute 0.6 0.1 - 1.0 K/uL   Eosinophils Relative 1 %   Eosinophils Absolute 0.1 0.0 - 0.5 K/uL   Basophils Relative 0 %   Basophils Absolute 0.0 0.0 - 0.1 K/uL   Immature Granulocytes 1 %   Abs Immature Granulocytes 0.09 (H) 0.00 - 0.07 K/uL    Comment: Performed at [facility], [address] [place], [place] [address]  Protime-INR      Status: Abnormal   Collection Time: 03/01/23  9:27 AM  Result Value Ref Range   Prothrombin Time 15.3 (H) 11.4 - 15.2 seconds   INR 1.2 0.8 - 1.2    Comment: (NOTE) INR goal varies based on device and disease states. Performed at [facility], [address], [place], [place] [address]   APTT     Status: None   Collection Time: 03/01/23  9:27 AM  Result Value Ref Range   aPTT 26 24 - 36 seconds    Comment: Performed at [facility], [address], [place], [place] [address]  I-Stat Lactic Acid, ED     Status: None   Collection Time: 03/01/23  9:42 AM  Result Value Ref Range   Lactic Acid, Venous 0.9 0.5 - 1.9 mmol/L  Blood Culture (routine x 2)     Status: None (Preliminary result)   Collection Time: 03/01/23  9:49 AM   Specimen: BLOOD  Result Value Ref Range   Specimen Description      BLOOD LEFT ANTECUBITAL Performed at [facility], [address], [place], [place] [address]    Special Requests      BOTTLES DRAWN AEROBIC AND ANAEROBIC Blood Culture adequate volume Performed at [facility], [address] [place], [place] [address]    Culture PENDING    Report Status PENDING    DG Chest Port 1 View  Result Date: 03/01/2023 CLINICAL DATA:  Questionable sepsis - evaluate for abnormality. Low-grade fever. Patient is lethargic. Patient is currently on chemotherapy for lung carcinoma. EXAM: PORTABLE CHEST 1 VIEW COMPARISON:  09/20/2022. FINDINGS: Redemonstration of extensive increased interstitial markings throughout bilateral lungs with underlying areas of relative lucency, favoring underlying emphysema. There are new heterogeneous opacities in the right hilar region, right mid lower lung zone, laterally and in the left mid lung zone laterally, which may represent superimposed pneumonia. Follow-up to clearing is suggested. No large pleural effusion Normal cardio-mediastinal silhouette. No acute osseous abnormalities.  The soft tissues are within normal limits. Right-sided CT Port-A-Cath is seen with its tip overlying the cavoatrial junction region. IMPRESSION: *New bilateral opacities, right greater than left, concerning for pneumonia. Follow-up to clearing is suggested. *Other nonacute observations, as described above. Electronically Signed   By: Jules Schick M.D.   On: 03/01/2023 09:41   CT CHEST ABDOMEN PELVIS  W CONTRAST  Result Date: 02/28/2023 CLINICAL DATA:  Non-small cell lung cancer follow-up; * Tracking Code: BO * EXAM: CT CHEST, ABDOMEN, AND PELVIS WITH CONTRAST TECHNIQUE: Multidetector CT imaging of the chest, abdomen and pelvis was performed following the standard protocol during bolus administration of intravenous contrast. RADIATION DOSE REDUCTION: This exam was performed according to the departmental dose-optimization program which includes automated exposure control, adjustment of the mA and/or kV according to patient size and/or use of iterative reconstruction technique. CONTRAST:  80mL OMNIPAQUE IOHEXOL 300 MG/ML  SOLN COMPARISON:  CT chest, abdomen and pelvis dated December 22, 2018. FINDINGS: CT CHEST FINDINGS Cardiovascular: Normal heart size. No pericardial effusion. Normal caliber thoracic aorta with moderate atherosclerotic disease. Mild coronary artery calcifications. Mediastinum/Nodes: Esophagus is unremarkable. No enlarged lymph nodes seen in the chest. Lungs/Pleura: Central airways are patent. Moderate to severe centrilobular emphysema. No pleural effusion. Stable spiculated right lower lobe nodule measures 2.0 x 1.2 cm on series 4, image 71, remeasured in similar plane on prior. Stable spiculated solid nodule of the right upper lobe measuring 7 mm on series 4, image 33. Stable solid nodule of the left upper lobe measuring 6 mm on series 4, image 45. New ground-glass opacities which are most pronounced in the medial lungs and new bilateral lower lobe paraspinal nodular consolidations.  Musculoskeletal: Multiple sclerotic osseous lesions, including severe pathologic compression deformity of T7, unchanged when compared with the prior exam. CT ABDOMEN PELVIS FINDINGS Hepatobiliary: Slight interval decreased size right liver lesion measuring 1.4 x 1.1 cm on series 2, image 57, previously 1.5 x 1.1 cm. Other tiny lesions of the right hepatic lobe are stable. Stable simple appearing cyst of the left lobe of the liver. Gallstones. No gallbladder wall thickening. Pancreas: Atrophic pancreas. Spleen: Normal in size without focal abnormality. Adrenals/Urinary Tract: Bilateral adrenal glands are unremarkable. No hydronephrosis or nephrolithiasis. Unchanged mild bladder wall thickening. Stomach/Bowel: Stomach is within normal limits. Appendix appears normal. No evidence of bowel wall thickening, distention, or inflammatory changes. Vascular/Lymphatic: Aortic atherosclerosis. Filling defects of the bilateral common femoral veins. No enlarged abdominal or pelvic lymph nodes. Reproductive: Status post hysterectomy. No adnexal masses. Other: No abdominal wall hernia or abnormality. No abdominopelvic ascites. Musculoskeletal: Unchanged sclerotic osseous lesions, including L2 lesion. IMPRESSION: 1. Stable spiculated solid nodules of the right lower lobe and right upper lobe. 2. 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 Recommend attention on follow-up. 3. Slight interval decreased size of right liver lesion. Other liver lesions are stable. 4. Unchanged sclerotic osseous metastatic disease. 5. Filling defects of the bilateral common femoral veins, concerning for deep venous thrombosis. Recommend bilateral lower extremity venous ultrasound. 6. Aortic Atherosclerosis (ICD10-I70.0) and Emphysema (ICD10-J43.9). These results will be called to the ordering clinician or representative by the  Radiologist Assistant, and communication documented in the PACS or [employer]. Electronically Signed   By: Allegra Lai M.D.   On: 02/28/2023 14:19    Pending Labs Unresulted Labs (From admission, onward)     Start     Ordered   03/01/23 1035  Strep pneumoniae urinary antigen  Once,   R        03/01/23 1035   03/01/23 1035  Expectorated Sputum Assessment w Gram Stain, Rflx to Resp Cult  Once,   R        03/01/23 1035   03/01/23 0903  Blood Culture (routine x 2)  (Septic presentation on  arrival (screening labs, nursing and treatment orders for obvious sepsis))  BLOOD CULTURE X 2,   STAT      03/01/23 0902   03/01/23 0903  Urinalysis, w/ Reflex to Culture (Infection Suspected) -Urine, Clean Catch  (Septic presentation on arrival (screening labs, nursing and treatment orders for obvious sepsis))  ONCE - URGENT,   URGENT       Question:  Specimen Source  Answer:  Urine, Clean Catch   03/01/23 0902            Vitals/Pain Today's Vitals   03/01/23 0930 03/01/23 0948 03/01/23 1100 03/01/23 1215  BP: (!) 102/59  97/62 99/64  Pulse: 88 87 83 79  Resp: (!) 22 (!) 21 19 19   Temp:   100.2 F (37.9 C)   TempSrc:   Rectal   SpO2: 91% (!) 88% 95% 96%  Weight:      Height:      PainSc:        Isolation Precautions No active isolations  Medications Medications  lactated ringers infusion ( Intravenous New Bag/Given 03/01/23 0932)  azithromycin (ZITHROMAX) 500 mg in sodium chloride 0.9 % 250 mL IVPB (0 mg Intravenous Stopped 03/01/23 1131)  cefTRIAXone (ROCEPHIN) 1 g in sodium chloride 0.9 % 100 mL IVPB (0 g Intravenous Stopped 03/01/23 1131)  acetaminophen (TYLENOL) tablet 650 mg (650 mg Oral Given 03/01/23 0938)  iohexol (OMNIPAQUE) 350 MG/ML injection 75 mL (75 mLs Intravenous Contrast Given 03/01/23 1300)    Mobility non-ambulatory  A&Ox4

## 2023-03-01 NOTE — Plan of Care (Signed)
Problem: Education: Goal: Ability to verbalize activity precautions or restrictions will improve Outcome: Progressing Goal: Knowledge of the prescribed therapeutic regimen will improve Outcome: Progressing   Problem: Activity: Goal: Ability to tolerate increased activity will improve Outcome: Progressing Goal: Will remain free from falls Outcome: Progressing

## 2023-03-01 NOTE — ED Triage Notes (Signed)
Patient BIB EMS. Patient's family called EMS stating patient was lethargic and had a low grade fever this AM. On EMS arrival O2 was 50% on RA. Patient became more alert after O2 improved. Patient is actively on chemo for lung cancer. Does not normally wear O2 at home.

## 2023-03-01 NOTE — ED Provider Notes (Signed)
[name] AT [facility] Provider Note   CSN: [id number] Arrival date & time: 03/01/23  1884     History  Chief Complaint  Patient presents with   Respiratory Distress    Hayley Wu is a 72 y.o. female.  72 year old female who presents with altered middle status.  Patient was found to be somnolent by EMS after family called them.  Was noted to have a low-grade fever this morning.  Patient recently started on new psychiatric medications several days ago but did not take it yesterday.  Patient is on chemotherapy for lung cancer.  Does not normally use oxygen.  When EMS arrived, patient's O2 sat was 50% on room air.  Placed on oxygen and improved greatly.  Her blood sugar was over 100.  Patient notes that she has had a cough but no vomiting or diarrhea.  Denies any abdominal or chest discomfort.  History of UTIs and foul-smelling urine noted denies any flank pain transported here for further management       Home Medications Prior to Admission medications   Medication Sig Start Date End Date Taking? Authorizing Provider  acetaminophen (TYLENOL) 500 MG tablet Take 500 mg by mouth every 6 (six) hours as needed for moderate pain.    [provider]  amLODipine (NORVASC) 10 MG tablet Take 10 mg by mouth daily.    [provider]  APIXABAN Everlene Balls) VTE STARTER PACK (10MG  AND 5MG ) Take as directed on package: start with two-5mg  tablets twice daily for 7 days. On day 8, switch to one-5mg  tablet twice daily. 02/28/23   Heilingoetter, Cassandra L, PA-C  folic acid (FOLVITE) 1 MG tablet Take 1 tablet by mouth once daily 02/13/23   Heilingoetter, Cassandra L, PA-C  hydrochlorothiazide (HYDRODIURIL) 12.5 MG tablet Take 12.5 mg by mouth every morning. 12/14/22   [provider]  levothyroxine (SYNTHROID) 100 MCG tablet Take 1 tablet (100 mcg total) by mouth daily at 6 (six) AM. 10/21/22 01/19/23  Heilingoetter, Cassandra L, PA-C   lidocaine-prilocaine (EMLA) cream Apply 1 Application topically as needed. 12/30/22   Heilingoetter, Cassandra L, PA-C  losartan (COZAAR) 100 MG tablet Take 100 mg by mouth daily.    [provider]  mirtazapine (REMERON) 30 MG tablet Take 1 tablet (30 mg total) by mouth at bedtime. 02/24/23   Si Gaul, MD  oxyCODONE (OXY IR/ROXICODONE) 5 MG immediate release tablet Take 1 tablet (5 mg total) by mouth every 3 (three) hours as needed for moderate pain ((score 4 to 6)). Patient not taking: Reported on 01/20/2023 09/27/22   Berton Mount I, MD  prochlorperazine (COMPAZINE) 10 MG tablet Take 1 tablet (10 mg total) by mouth every 6 (six) hours as needed. Patient not taking: Reported on 01/20/2023 10/21/22   Heilingoetter, Cassandra L, PA-C  VITAMIN D PO Take 1 tablet by mouth daily.    [provider]      Allergies    Erythromycin, Amoxicillin, Latex, and Prednisone    Review of Systems   Review of Systems  All other systems reviewed and are negative.   Physical Exam Updated Vital Signs BP 106/62   Pulse 96   Temp (!) 101.1 F (38.4 C) (Rectal)   Resp 18   Ht 1.6 m (5\' 3" )   Wt 67.1 kg   SpO2 95%   BMI 26.22 kg/m  Physical Exam Vitals and nursing note reviewed.  Constitutional:      General: She is not in acute distress.  Appearance: Normal appearance. She is well-developed. She is not toxic-appearing.  HENT:     Head: Normocephalic and atraumatic.  Eyes:     General: Lids are normal.     Conjunctiva/sclera: Conjunctivae normal.     Pupils: Pupils are equal, round, and reactive to light.  Neck:     Thyroid: No thyroid mass.     Trachea: No tracheal deviation.  Cardiovascular:     Rate and Rhythm: Normal rate and regular rhythm.     Heart sounds: Normal heart sounds. No murmur heard.    No gallop.  Pulmonary:     Effort: Pulmonary effort is normal. No respiratory distress.     Breath sounds: Normal breath sounds. No stridor. No decreased breath  sounds, wheezing, rhonchi or rales.  Abdominal:     General: There is no distension.     Palpations: Abdomen is soft.     Tenderness: There is no abdominal tenderness. There is no rebound.  Musculoskeletal:        General: No tenderness. Normal range of motion.     Cervical back: Normal range of motion and neck supple.  Skin:    General: Skin is warm and dry.     Findings: No abrasion or rash.  Neurological:     General: No focal deficit present.     Mental Status: She is alert and oriented to person, place, and time. Mental status is at baseline.     GCS: GCS eye subscore is 4. GCS verbal subscore is 5. GCS motor subscore is 6.     Cranial Nerves: Cranial nerves are intact. No cranial nerve deficit.     Sensory: No sensory deficit.     Motor: Motor function is intact.  Psychiatric:        Attention and Perception: Attention normal.        Speech: Speech normal.        Behavior: Behavior normal.     ED Results / Procedures / Treatments   Labs (all labs ordered are listed, but only abnormal results are displayed) Labs Reviewed  RESP PANEL BY RT-PCR (RSV, FLU A&B, COVID)  [id number]  CULTURE, BLOOD (ROUTINE X 2)  CULTURE, BLOOD (ROUTINE X 2)  COMPREHENSIVE METABOLIC PANEL  CBC WITH DIFFERENTIAL/PLATELET  PROTIME-INR  APTT  URINALYSIS, W/ REFLEX TO CULTURE (INFECTION SUSPECTED)  I-STAT CG4 LACTIC ACID, ED    EKG EKG Interpretation Date/Time:  Saturday March 01 2023 08:52:03 EDT Ventricular Rate:  97 PR Interval:  147 QRS Duration:  105 QT Interval:  378 QTC Calculation: 481 R Axis:   -19  Text Interpretation: Sinus rhythm Probable left atrial enlargement Borderline left axis deviation Borderline low voltage, extremity leads Confirmed by Lorre Nick ([id number]) on 03/01/2023 9:05:08 AM  Radiology CT CHEST ABDOMEN PELVIS W CONTRAST  Result Date: 02/28/2023 CLINICAL DATA:  Non-small cell lung cancer follow-up; * Tracking Code: BO * EXAM: CT CHEST, ABDOMEN, AND PELVIS  WITH CONTRAST TECHNIQUE: Multidetector CT imaging of the chest, abdomen and pelvis was performed following the standard protocol during bolus administration of intravenous contrast. RADIATION DOSE REDUCTION: This exam was performed according to the departmental dose-optimization program which includes automated exposure control, adjustment of the mA and/or kV according to patient size and/or use of iterative reconstruction technique. CONTRAST:  80mL OMNIPAQUE IOHEXOL 300 MG/ML  SOLN COMPARISON:  CT chest, abdomen and pelvis dated December 22, 2018. FINDINGS: CT CHEST FINDINGS Cardiovascular: Normal heart size. No pericardial effusion. Normal caliber thoracic aorta with moderate atherosclerotic  disease. Mild coronary artery calcifications. Mediastinum/Nodes: Esophagus is unremarkable. No enlarged lymph nodes seen in the chest. Lungs/Pleura: Central airways are patent. Moderate to severe centrilobular emphysema. No pleural effusion. Stable spiculated right lower lobe nodule measures 2.0 x 1.2 cm on series 4, image 71, remeasured in similar plane on prior. Stable spiculated solid nodule of the right upper lobe measuring 7 mm on series 4, image 33. Stable solid nodule of the left upper lobe measuring 6 mm on series 4, image 45. New ground-glass opacities which are most pronounced in the medial lungs and new bilateral lower lobe paraspinal nodular consolidations. Musculoskeletal: Multiple sclerotic osseous lesions, including severe pathologic compression deformity of T7, unchanged when compared with the prior exam. CT ABDOMEN PELVIS FINDINGS Hepatobiliary: Slight interval decreased size right liver lesion measuring 1.4 x 1.1 cm on series 2, image 57, previously 1.5 x 1.1 cm. Other tiny lesions of the right hepatic lobe are stable. Stable simple appearing cyst of the left lobe of the liver. Gallstones. No gallbladder wall thickening. Pancreas: Atrophic pancreas. Spleen: Normal in size without focal abnormality.  Adrenals/Urinary Tract: Bilateral adrenal glands are unremarkable. No hydronephrosis or nephrolithiasis. Unchanged mild bladder wall thickening. Stomach/Bowel: Stomach is within normal limits. Appendix appears normal. No evidence of bowel wall thickening, distention, or inflammatory changes. Vascular/Lymphatic: Aortic atherosclerosis. Filling defects of the bilateral common femoral veins. No enlarged abdominal or pelvic lymph nodes. Reproductive: Status post hysterectomy. No adnexal masses. Other: No abdominal wall hernia or abnormality. No abdominopelvic ascites. Musculoskeletal: Unchanged sclerotic osseous lesions, including L2 lesion. IMPRESSION: 1. Stable spiculated solid nodules of the right lower lobe and right upper lobe. 2. 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 Recommend attention on follow-up. 3. Slight interval decreased size of right liver lesion. Other liver lesions are stable. 4. Unchanged sclerotic osseous metastatic disease. 5. Filling defects of the bilateral common femoral veins, concerning for deep venous thrombosis. Recommend bilateral lower extremity venous ultrasound. 6. Aortic Atherosclerosis (ICD10-I70.0) and Emphysema (ICD10-J43.9). These results will be called to the ordering clinician or representative by the Radiologist Assistant, and communication documented in the PACS or [employer]. Electronically Signed   By: Allegra Lai M.D.   On: 02/28/2023 14:19    Procedures Procedures    Medications Ordered in ED Medications  lactated ringers infusion (has no administration in time range)    ED Course/ Medical Decision Making/ A&P                                 Medical Decision Making Amount and/or Complexity of Data Reviewed Labs: ordered. Radiology: ordered. ECG/medicine tests: ordered.  Risk OTC drugs. Prescription drug  management.   Patient is EKG per interpretation shows normal sinus rhythm.  Patient's chest x-ray consistent with pneumonia.  Slight hypokalemia noted on her electrolytes and will give oral potassium for this.  Patient be started on IV antibiotics.  Lactate normal.  Do not feel that she is septic at this time.  Patient's viral panel is pending at this time.  She is mentating appropriately and doing well on a Ventimask at 40%.  No emergent need for airway intervention at this time.  Discussed with hospitalist and patient to be admitted.  Family at bedside and they have been updated  CRITICAL CARE Performed by: Toy Baker Total critical care time: 45 minutes Critical care time  was exclusive of separately billable procedures and treating other patients. Critical care was necessary to treat or prevent imminent or life-threatening deterioration. Critical care was time spent personally by me on the following activities: development of treatment plan with patient and/or surrogate as well as nursing, discussions with consultants, evaluation of patient's response to treatment, examination of patient, obtaining history from patient or surrogate, ordering and performing treatments and interventions, ordering and review of laboratory studies, ordering and review of radiographic studies, pulse oximetry and re-evaluation of patient's condition.         Final Clinical Impression(s) / ED Diagnoses Final diagnoses:  None    Rx / DC Orders ED Discharge Orders     None         Lorre Nick, MD 03/01/23 1018

## 2023-03-01 NOTE — Progress Notes (Signed)
ANTICOAGULATION CONSULT NOTE - Initial Consult  Pharmacy Consult for Eliquis Indication:  pulmonary artery occlusion with no thrombosis  Allergies  Allergen Reactions   Erythromycin Diarrhea   Amoxicillin Rash   Latex Rash   Prednisone Rash    Patient Measurements: Height: 5\' 3"  (160 cm) Weight: 67.1 kg (148 lb) IBW/kg (Calculated) : 52.4  Vital Signs: Temp: 100.2 F (37.9 C) (09/21 1100) Temp Source: Rectal (09/21 1100) BP: 95/57 (09/21 1245) Pulse Rate: 78 (09/21 1245)  Labs: Recent Labs    03/01/23 0927  HGB 10.8*  HCT 34.0*  PLT 234  APTT 26  LABPROT 15.3*  INR 1.2  CREATININE 0.52    Estimated Creatinine Clearance: 60.2 mL/min (by C-G formula based on SCr of 0.52 mg/dL).   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 Oncology noted that the patient had a pulmonary artery occlusion with no thrombosis and subsequently prescribed her the Eliquis VTE starter pack on 9/20 (the patient has not yet started this medication) with instructions to obtain lower extremity doppler outpatient. 9/21 CT PE negative; 9/21 doppler pending. Pharmacy consulted to dose Eliquis.   Today, 9/21: - Hgb 10.8, plts 234--stable - No s/sx of bleeding reported  Goal of Therapy:  Monitor platelets by anticoagulation protocol: Yes   Plan:  - Start Eliquis 10mg  BID x7 days, followed by 5mg  BID thereafter - Monitor for s/sx of bleeding  - F/u doppler results    Cherylin Mylar, PharmD Clinical Pharmacist  9/21/20243:10 PM

## 2023-03-01 NOTE — Plan of Care (Signed)

## 2023-03-01 NOTE — Sepsis Progress Note (Signed)
Sepsis protocol is being followed by [facility].

## 2023-03-01 NOTE — H&P (Signed)
History and Physical    Patient: Hayley Wu CZY:[id number] DOB: April 06, 1951 DOA: 03/01/2023 DOS: the patient was seen and examined on 03/01/2023 PCP: [name], PA-C  Patient coming from: Home  Chief Complaint:  Chief Complaint  Patient presents with   Respiratory Distress   HPI: Hayley Wu is a 72 y.o. 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  When EMS initially evaluated her she was saturating 50% on room air.  The patient became more alert after supplemental oxygen was provided.  She has been coughing which is occasionally productive, occasional mild wheezing, no pleuritic chest pain, no URI symptoms or sick contacts exposure. She was recently prescribed apixaban (has not started), advised about symptoms of DVT/PE and sent for bilateral lower extremity Doppler, which she has not obtained yet.  Her appetite is decreased and was recently prescribed mirtazapine 30 mg at bedtime. No chest pain, palpitations, diaphoresis, PND, orthopnea or current pitting edema of the lower extremities. No abdominal pain, nausea, emesis, diarrhea,  melena or hematochezia, but has been constipated for a few days. No flank pain, dysuria, frequency or hematuria. No polyuria, polydipsia, polyphagia or blurred vision.   Lab work: Her CBC showed a white count of 7.5, hemoglobin 10.8 g/dL and platelets 093.  PT was 15.3, INR 1.2 and PTT 26.  Negative coronavirus, influenza and RSV PCR.  CMP showed normal renal function, a potassium of 3.0 mmol/L, the rest of the electrolytes are normal after calcium correction.  LFTs with a total protein of 6.0 and albumin of 2.8 g/dL, but otherwise normal.  Imaging: Portable 1 view chest  radiograph showing new bilateral opacities, right greater than left, concerning for pneumonia.  CTA chest showed a stable occluded left lower lobe pulmonary artery branch signs 12/22/2022 with no acute pulmonary same.  There are interval changes of acute CHF with interval cardiomegaly, interstitial pulmonary edema and minimal right pleural effusion.  CAD and aortic atherosclerosis.  Please see full radiology report for further details.   ED course: Initial vital signs were temperature 99.9 F, pulse 100, respiration 18, BP 106/62 mmHg and O2 sat 54% on room air.  The patient received NRB oxygen 10 L, azithromycin 500 mg IVP, ceftriaxone 1 g IVP and was started on LR at 150 mL/h x 20 hours.  Review of Systems: As mentioned in the history of present illness. All other systems reviewed and are negative.  Past Medical History:  Diagnosis Date   Allergy    Contact lens/glasses fitting    Hypertension    Past Surgical History:  Procedure Laterality Date   ABDOMINAL HYSTERECTOMY     BREAST SURGERY  2010   rt lumpectomy-neg   IR IMAGING GUIDED PORT INSERTION  12/18/2022   LAMINECTOMY N/A 09/21/2022   Procedure: THORACIC LAMINECTOMY FOR TUMOR;  Surgeon: Tia Alert, MD;  [facility];  Service: Neurosurgery;  Laterality: N/A;   OPEN REDUCTION INTERNAL FIXATION (ORIF) DISTAL RADIAL FRACTURE Left 11/26/2013   Procedure: OPEN REDUCTION INTERNAL FIXATION (ORIF) LEFT  DISTAL RADIUS ;  Surgeon: Nestor Lewandowsky, MD;  Location: [facility];  Service: Orthopedics;  Laterality: Left;  with block   TUBAL LIGATION     Social History:  reports that she has been smoking. She does not have any  smokeless tobacco history on file. She reports that she does not drink alcohol and does not use drugs.  Allergies  Allergen Reactions   Erythromycin Diarrhea   Amoxicillin Rash   Latex Rash   Prednisone Rash    Family History  Problem Relation Age of Onset   Cancer Mother    Hypertension Father     Heart attack Father    Hypertension Sister    Hypertension Brother    Heart attack Brother    Cancer Daughter        thyroid   Diabetes Maternal Grandfather     Prior to Admission medications   Medication Sig Start Date End Date Taking? Authorizing Provider  acetaminophen (TYLENOL) 500 MG tablet Take 500 mg by mouth every 6 (six) hours as needed for moderate pain.    [provider]  amLODipine (NORVASC) 10 MG tablet Take 10 mg by mouth daily.    [provider]  APIXABAN Everlene Balls) VTE STARTER PACK (10MG  AND 5MG ) Take as directed on package: start with two-5mg  tablets twice daily for 7 days. On day 8, switch to one-5mg  tablet twice daily. 02/28/23   Heilingoetter, Cassandra L, PA-C  folic acid (FOLVITE) 1 MG tablet Take 1 tablet by mouth once daily 02/13/23   Heilingoetter, Cassandra L, PA-C  hydrochlorothiazide (HYDRODIURIL) 12.5 MG tablet Take 12.5 mg by mouth every morning. 12/14/22   [provider]  levothyroxine (SYNTHROID) 100 MCG tablet Take 1 tablet (100 mcg total) by mouth daily at 6 (six) AM. 10/21/22 01/19/23  Heilingoetter, Cassandra L, PA-C  lidocaine-prilocaine (EMLA) cream Apply 1 Application topically as needed. 12/30/22   Heilingoetter, Cassandra L, PA-C  losartan (COZAAR) 100 MG tablet Take 100 mg by mouth daily.    [provider]  mirtazapine (REMERON) 30 MG tablet Take 1 tablet (30 mg total) by mouth at bedtime. 02/24/23   Si Gaul, MD  oxyCODONE (OXY IR/ROXICODONE) 5 MG immediate release tablet Take 1 tablet (5 mg total) by mouth every 3 (three) hours as needed for moderate pain ((score 4 to 6)). Patient not taking: Reported on 01/20/2023 09/27/22   Berton Mount I, MD  prochlorperazine (COMPAZINE) 10 MG tablet Take 1 tablet (10 mg total) by mouth every 6 (six) hours as needed. Patient not taking: Reported on 01/20/2023 10/21/22   Heilingoetter, Cassandra L, PA-C  VITAMIN D PO Take 1 tablet by mouth daily.    [provider]     Physical Exam: Vitals:   03/01/23 0848 03/01/23 0851 03/01/23 0900 03/01/23 0930  BP:    (!) 102/59  Pulse: 97 96  88  Resp:  18  (!) 22  Temp:  99.9 F (37.7 C) (!) 101.1 F (38.4 C)   TempSrc:   Rectal   SpO2: 99% 95%  91%  Weight:      Height:      Physical Exam Vitals reviewed.  Constitutional:      General: She is awake. She is not in acute distress.    Appearance: She is ill-appearing. She is not toxic-appearing.     Interventions: Face mask in place.  HENT:     Head: Normocephalic.     Nose: No rhinorrhea.     Mouth/Throat:     Mouth: Mucous membranes are moist.  Eyes:     General: No scleral icterus.    Pupils: Pupils are equal, round, and reactive to light.  Neck:     Vascular: No JVD.  Cardiovascular:     Rate  and Rhythm: Normal rate and regular rhythm.     Heart sounds: S1 normal and S2 normal.  Pulmonary:     Breath sounds: Wheezing, rhonchi and rales present.  Abdominal:     General: Bowel sounds are normal.     Palpations: Abdomen is soft.  Musculoskeletal:     Cervical back: Neck supple.     Right lower leg: No edema.     Left lower leg: No edema.     Comments: Mild generalized weakness.  Skin:    General: Skin is warm and dry.  Neurological:     General: No focal deficit present.     Mental Status: She is alert and oriented to person, place, and time.  Psychiatric:        Mood and Affect: Mood normal.        Behavior: Behavior normal. Behavior is cooperative.    Data Reviewed:  Results are pending, will review when available.  Assessment and Plan: Principal Problem:   Acute respiratory failure with hypoxia (HCC) Superimposed on:   Adenocarcinoma of right lung, stage 4 (HCC) High suspicion for infectious process given fever. Admit to PCU/inpatient. Continue supplemental oxygen. Scheduled and as needed bronchodilators. Continue ceftriaxone 1 g IVPB daily. Continue azithromycin 500 mg IVPB daily. Check strep pneumoniae urinary  antigen. Check sputum Gram stain, culture and sensitivity. Follow-up blood culture and sensitivity. Follow-up CBC and chemistry in the morning. Will need to hold chemotherapy on Monday.  Active Problems:   Cardiomegaly  Decrease IV fluids. Albumin 50 g IVPB due to soft BP. Add on BNP. Check echocardiogram.    Occlusion of left pulmonary artery (HCC)  Will begin apixaban as recommended by oncology. Will obtain bilateral lower extremity Doppler today instead of Monday.    Hypokalemia Correcting. Add on magnesium level. Follow-up potassium in AM.    Hypertension Hold antihypertensives due to soft BP measurements.    Hypothyroidism Continue levothyroxine 100 mcg p.o. daily.    Constipation Has not been eating much. Laxative as needed.    Normocytic anemia In the setting of malignancy/cancer treatment. Monitor hematocrit and hemoglobin.    Moderate protein malnutrition (HCC) Protein supplementation. Has not started mirtazapine at bedtime yet. -Will start tonight while in the hospital.    Advance Care Planning:   Code Status: Full Code   Consults:   Family Communication:   Severity of Illness: The appropriate patient status for this patient is INPATIENT. Inpatient status is judged to be reasonable and necessary in order to provide the required intensity of service to ensure the patient's safety. The patient's presenting symptoms, physical exam findings, and initial radiographic and laboratory data in the context of their chronic comorbidities is felt to place them at high risk for further clinical deterioration. Furthermore, it is not anticipated that the patient will be medically stable for discharge from the hospital within 2 midnights of admission.   * I certify that at the point of admission it is my clinical judgment that the patient will require inpatient hospital care spanning beyond 2 midnights from the point of admission due to high intensity of service, high  risk for further deterioration and high frequency of surveillance required.*  Author: Bobette Mo, MD 03/01/2023 10:29 AM  For on call review [email or web address].   This document was prepared using Dragon voice recognition software and may contain some unintended transcription errors.

## 2023-03-02 ENCOUNTER — Inpatient Hospital Stay (HOSPITAL_COMMUNITY): Payer: PPO

## 2023-03-02 DIAGNOSIS — R609 Edema, unspecified: Secondary | ICD-10-CM | POA: Diagnosis not present

## 2023-03-02 DIAGNOSIS — I517 Cardiomegaly: Secondary | ICD-10-CM | POA: Diagnosis not present

## 2023-03-02 DIAGNOSIS — J9601 Acute respiratory failure with hypoxia: Secondary | ICD-10-CM | POA: Diagnosis not present

## 2023-03-02 LAB — BLOOD GAS, VENOUS
Acid-Base Excess: 3.5 mmol/L — ABNORMAL HIGH (ref 0.0–2.0)
Bicarbonate: 29.1 mmol/L — ABNORMAL HIGH (ref 20.0–28.0)
O2 Saturation: 78.6 %
Patient temperature: 37
pCO2, Ven: 47 mmHg (ref 44–60)
pH, Ven: 7.4 (ref 7.25–7.43)
pO2, Ven: 49 mmHg — ABNORMAL HIGH (ref 32–45)

## 2023-03-02 LAB — PROCALCITONIN: Procalcitonin: <0.1 ng/mL

## 2023-03-02 LAB — ECHOCARDIOGRAM COMPLETE
Area-P 1/2: 3.54 cm2
Calc EF: 51 %
Height: 63 in
S' Lateral: 3.8 cm
Single Plane A2C EF: 57.3 %
Single Plane A4C EF: 40.4 %
Weight: 2356.28 oz

## 2023-03-02 LAB — CBC
HCT: 32.1 % — ABNORMAL LOW (ref 36.0–46.0)
Hemoglobin: 9.8 g/dL — ABNORMAL LOW (ref 12.0–15.0)
MCH: 30.1 pg (ref 26.0–34.0)
MCHC: 30.5 g/dL (ref 30.0–36.0)
MCV: 98.5 fL (ref 80.0–100.0)
Platelets: 176 10*3/uL (ref 150–400)
RBC: 3.26 MIL/uL — ABNORMAL LOW (ref 3.87–5.11)
RDW: 15 % (ref 11.5–15.5)
WBC: 6 10*3/uL (ref 4.0–10.5)
nRBC: 0 % (ref 0.0–0.2)

## 2023-03-02 LAB — COMPREHENSIVE METABOLIC PANEL
ALT: 13 U/L (ref 0–44)
AST: 17 U/L (ref 15–41)
Albumin: 3.3 g/dL — ABNORMAL LOW (ref 3.5–5.0)
Alkaline Phosphatase: 59 U/L (ref 38–126)
Anion gap: 9 (ref 5–15)
BUN: 16 mg/dL (ref 8–23)
CO2: 26 mmol/L (ref 22–32)
Calcium: 8.4 mg/dL — ABNORMAL LOW (ref 8.9–10.3)
Chloride: 107 mmol/L (ref 98–111)
Creatinine, Ser: 0.37 mg/dL — ABNORMAL LOW (ref 0.44–1.00)
GFR, Estimated: >60 mL/min (ref >60)
Glucose, Bld: 98 mg/dL (ref 70–99)
Potassium: 3.8 mmol/L (ref 3.5–5.1)
Sodium: 142 mmol/L (ref 135–145)
Total Bilirubin: 1 mg/dL (ref 0.3–1.2)
Total Protein: 5.8 g/dL — ABNORMAL LOW (ref 6.5–8.1)

## 2023-03-02 LAB — MRSA NEXT GEN BY PCR, NASAL: MRSA by PCR Next Gen: NOT DETECTED

## 2023-03-02 LAB — GLUCOSE, CAPILLARY: Glucose-Capillary: 94 mg/dL (ref 70–99)

## 2023-03-02 LAB — BRAIN NATRIURETIC PEPTIDE: B Natriuretic Peptide: 264.9 pg/mL — ABNORMAL HIGH (ref 0.0–100.0)

## 2023-03-02 MED ORDER — ALBUTEROL SULFATE (2.5 MG/3ML) 0.083% IN NEBU: 2.5000 mg | INHALATION_SOLUTION | RESPIRATORY_TRACT | Status: DC | PRN

## 2023-03-02 MED ORDER — POLYVINYL ALCOHOL 1.4 % OP SOLN
1.0000 [drp] | OPHTHALMIC | Status: DC | PRN
  Administered 2023-03-03: 1 [drp] via OPHTHALMIC
  Filled 2023-03-02: qty 15

## 2023-03-02 MED ORDER — SENNA 8.6 MG PO TABS
1.0000 | ORAL_TABLET | Freq: Every day | ORAL | Status: DC
  Administered 2023-03-02 – 2023-03-18 (×5): 8.6 mg via ORAL
  Filled 2023-03-02 (×16): qty 1

## 2023-03-02 MED ORDER — METHYLPREDNISOLONE SODIUM SUCC 125 MG IJ SOLR
125.0000 mg | Freq: Two times a day (BID) | INTRAMUSCULAR | Status: AC
  Administered 2023-03-02 – 2023-03-04 (×6): 125 mg via INTRAVENOUS
  Filled 2023-03-02 (×6): qty 2

## 2023-03-02 MED ORDER — FUROSEMIDE 10 MG/ML IJ SOLN
40.0000 mg | Freq: Two times a day (BID) | INTRAMUSCULAR | Status: AC
  Administered 2023-03-02 (×2): 40 mg via INTRAVENOUS
  Filled 2023-03-02 (×2): qty 4

## 2023-03-02 NOTE — Hospital Course (Addendum)
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 Patient also having cough occasionally productive, mild wheezing.  She has been prescribed mirtazapine recently for low appetite. In the ED: Hypoxic 50% on room air placed on nonrebreather.  Labs showed hypokalemia stable renal function albumin 2.8 Pro-Cal less than 0.1 lactic acid 0.9 BNP 152 CBC with hemoglobin 10.8 influenza COVID-19 screen negative, UA with WBC more than 50 large leukocytes. chest x-ray>> bilateral opacities right more than left concerning for pneumonia. CTA>> occluded left lower lobe pulmonary artery branch since 7/14, stable collapse of T7 with paraspinal soft tissue density and acute kyphosis, interval change of CHF, cardiomegaly interstitial pulmonary edema minimal right pleural effusion known primary lung carcinoma 2.9 cm right lower lobe stable and large lymph nodes, stable left humeral head sclerotic metastasis or infarct, COPD. Patient was wheezing and having rales on exam.  Placed on empiric antibiotic and admitted PCCM was consulted due to worsening hypoxia, echo obtained shows EF 55 to 60% G1 DD normal RV systolic function mitral valve normal aortic valve tricuspid. 9/22 Started on a steroid.

## 2023-03-02 NOTE — Progress Notes (Signed)
Bilateral lower extremity venous duplex has been completed. Preliminary results can be found in CV Proc through chart review.  Results were given to the patient's nurse, Lurena Joiner.  03/02/23 9:37 AM Olen Cordial RVT

## 2023-03-02 NOTE — Consult Note (Signed)
NAME:  Hayley Wu, MRN:  [id number], DOB:  May 19, 1951, LOS: 1 ADMISSION DATE:  03/01/2023, CONSULTATION DATE: 03/02/2023 REFERRING MD: Dr. 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  Received palliative radiation T7, completed 10/25/2022 as well as chemotherapy, maintenance immunotherapy with Alimta and Keytruda (Dr. Arbutus Ped, last seen 02/11/2023). She had a surveillance CT CAP on 9/19 that showed suspected bilateral femoral filling defects, a newly identified subacute left pulmonary embolism.  Based on the probable lower extremity DVT Eliquis was started on 9/20.  She was admitted 9/21 with dyspnea, hypoxemia, lethargy and low-grade fever, minimally productive cough.  No clear sick exposures.  COVID-19, flu and RSV were all negative.  Pertinent  Medical History   Past Medical History:  Diagnosis Date   Allergy    Contact lens/glasses fitting    Hypertension   Stage IV adenocarcinoma lung with hepatic and bony metastases Hypertension Hypothyroidism  Significant Hospital Events: Including procedures, antibiotic start and stop dates in addition to other pertinent events   9/19 CT chest, abdomen, pelvis >> stable spiculated right lower lobe and right upper lobe nodules (smaller than original diagnosis), decrease right hepatic lesion, unchanged sclerotic osseous metastatic disease, new groundglass opacities medial bilateral lower lobes with some associated consolidation.  Question bilateral common femoral filling defects concerning for DVT 9/21 CT-PA >> left lower lobe PA pulmonary embolism, in retrospect may have been present as far back as 12/22/2022.  Diffuse increase in bilateral interstitial  infiltrates.  Other findings stable 9/22 echocardiogram >>   Interim History / Subjective:  Currently on 60 L/min high flow nasal cannula, FiO2 80%  Objective   Blood pressure 111/61, pulse 79, temperature 98.8 F (37.1 C), temperature source Oral, resp. rate 18, height 5\' 3"  (1.6 m), weight 66.8 kg, SpO2 91%.    FiO2 (%):  [80 %-90 %] 80 %   Intake/Output Summary (Last 24 hours) at 03/02/2023 1026 Last data filed at 03/02/2023 0901 Gross per 24 hour  Intake 3025.56 ml  Output 1325 ml  Net 1700.56 ml   Filed Weights   03/01/23 0844 03/01/23 2000  Weight: 67.1 kg 66.8 kg    Examination: General: Elderly woman, laying in bed in no distress.  Weak HENT: Oropharynx clear, strong voice, no secretions, no stridor Lungs: Bilateral inspiratory crackles, no wheezes Cardiovascular: Regular, borderline tachycardic, distant Abdomen: Nondistended positive bowel sounds Extremities: Trace pretibial edema, warm and well-perfused Neuro: Wake, alert, interacting appropriately, nonfocal GU: Deferred  Resolved Hospital Problem list     Assessment & Plan:   Acute respiratory failure with hypoxia New bilateral pulmonary infiltrates, differential includes pneumonia/opportunistic infection, pneumonitis due to either her radiation or her immunotherapy, cardiogenic pulmonary edema (at risk for cardiomyopathy from chemotherapy) Newly identified subacute left pulmonary embolism -Agree with azithromycin and ceftriaxone.  Low threshold to broaden if she clinically worsens given immunosuppressed state -Respiratory culture, blood culture -Echocardiogram ordered and pending, concern for possible new cardiomyopathy.  BNP is elevated -Would start empiric diuretics while we wait for echocardiogram results -Agree with Eliquis.  Lower extremity Dopplers pending to confirm lower extremity DVT -Favor initiation empiric steroids for possible immunotherapy induced pneumonitis >> Solu-Medrol 125 mg twice daily x 3  days and then change to 60mg  every day, eventually to Prednisone for 3  weeks -Depending on workup may need to consider change from her current immunotherapy.  Will need to discuss with Oncology.  Hypertension Hypothyroidism Constipation Normocytic anemia Moderate protein malnutrition -As per [facility] plans  Best Practice (right click and "Reselect all SmartList Selections" daily)   Diet/type: Regular consistency (see orders) DVT prophylaxis: DOAC GI prophylaxis: N/A Lines: N/A Foley:  N/A Code Status:  full code Last date of multidisciplinary goals of care discussion [pending]  Labs   CBC: Recent Labs  Lab 03/01/23 0927 03/02/23 0455  WBC 7.5 6.0  NEUTROABS 6.3  --   HGB 10.8* 9.8*  HCT 34.0* 32.1*  MCV 95.8 98.5  PLT 234 176    Basic Metabolic Panel: Recent Labs  Lab 03/01/23 0927 03/02/23 0455  NA 137 142  K 3.0* 3.8  CL 102 107  CO2 24 26  GLUCOSE 101* 98  BUN 23 16  CREATININE 0.52 0.37*  CALCIUM 8.1* 8.4*  MG 2.2  --    GFR: Estimated Creatinine Clearance: 60.1 mL/min (A) (by C-G formula based on SCr of 0.37 mg/dL (L)). Recent Labs  Lab 03/01/23 0927 03/01/23 0942 03/02/23 0455  PROCALCITON <0.10  --  <0.10  WBC 7.5  --  6.0  LATICACIDVEN  --  0.9  --     Liver Function Tests: Recent Labs  Lab 03/01/23 0927 03/02/23 0455  AST 20 17  ALT 18 13  ALKPHOS 79 59  BILITOT 1.0 1.0  PROT 6.0* 5.8*  ALBUMIN 2.8* 3.3*   No results for input(s): "LIPASE", "AMYLASE" in the last 168 hours. No results for input(s): "AMMONIA" in the last 168 hours.  ABG    Component Value Date/Time   HCO3 29.1 (H) 03/02/2023 0455   O2SAT 78.6 03/02/2023 0455     Coagulation Profile: Recent Labs  Lab 03/01/23 0927  INR 1.2    Cardiac Enzymes: No results for input(s): "CKTOTAL", "CKMB", "CKMBINDEX", "TROPONINI" in the last 168 hours.  HbA1C: No results found for: "HGBA1C"  CBG: Recent Labs  Lab 03/02/23 0756  GLUCAP 94    Review of Systems:   As  per HPI  Past Medical History:  She,  has a past medical history of Allergy, Contact lens/glasses fitting, and Hypertension.   Surgical History:   Past Surgical History:  Procedure Laterality Date   ABDOMINAL HYSTERECTOMY     BREAST SURGERY  2010   rt lumpectomy-neg   IR IMAGING GUIDED PORT INSERTION  12/18/2022   LAMINECTOMY N/A 09/21/2022   Procedure: THORACIC LAMINECTOMY FOR TUMOR;  Surgeon: Tia Alert, MD;  Location: [facility] OR;  Service: Neurosurgery;  Laterality: N/A;   OPEN REDUCTION INTERNAL FIXATION (ORIF) DISTAL RADIAL FRACTURE Left 11/26/2013   Procedure: OPEN REDUCTION INTERNAL FIXATION (ORIF) LEFT  DISTAL RADIUS ;  Surgeon: Nestor Lewandowsky, MD;  Location: [facility];  Service: Orthopedics;  Laterality: Left;  with block   TUBAL LIGATION       Social History:   reports that she has been smoking. She does not have any smokeless tobacco history on file. She reports that she does not drink alcohol and does not use drugs.   Family History:  Her family history includes Cancer in her daughter and mother; Diabetes in her maternal grandfather; Heart attack in her brother and father; Hypertension in her brother, father, and sister.   Allergies Allergies  Allergen Reactions   Erythromycin Diarrhea   Amoxicillin Rash   Latex Rash   Prednisone Rash     Home  Medications  Prior to Admission medications   Medication Sig Start Date End Date Taking? Authorizing Provider  acetaminophen (TYLENOL) 500 MG tablet Take 500 mg by mouth every 6 (six) hours as needed for moderate pain.   Yes [provider]  amLODipine (NORVASC) 10 MG tablet Take 10 mg by mouth daily.   Yes [provider]  folic acid (FOLVITE) 1 MG tablet Take 1 tablet by mouth once daily 02/13/23  Yes Heilingoetter, Cassandra L, PA-C  hydrochlorothiazide (HYDRODIURIL) 12.5 MG tablet Take 12.5 mg by mouth every morning. 12/14/22  Yes [provider]  levothyroxine (SYNTHROID) 100 MCG  tablet Take 1 tablet (100 mcg total) by mouth daily at 6 (six) AM. 10/21/22 03/01/23 Yes Heilingoetter, Cassandra L, PA-C  lidocaine-prilocaine (EMLA) cream Apply 1 Application topically as needed. 12/30/22  Yes Heilingoetter, Cassandra L, PA-C  losartan (COZAAR) 100 MG tablet Take 100 mg by mouth daily.   Yes [provider]  mirtazapine (REMERON) 30 MG tablet Take 1 tablet (30 mg total) by mouth at bedtime. 02/24/23  Yes Si Gaul, MD  prochlorperazine (COMPAZINE) 10 MG tablet Take 1 tablet (10 mg total) by mouth every 6 (six) hours as needed. Patient taking differently: Take 10 mg by mouth every 6 (six) hours as needed for refractory nausea / vomiting. 10/21/22  Yes Heilingoetter, Cassandra L, PA-C  VITAMIN D PO Take 1 tablet by mouth daily.   Yes [provider]  APIXABAN Everlene Balls) VTE STARTER PACK (10MG  AND 5MG ) Take as directed on package: start with two-5mg  tablets twice daily for 7 days. On day 8, switch to one-5mg  tablet twice daily. Patient not taking: Reported on 03/01/2023 02/28/23   Heilingoetter, Johnette Abraham, PA-C     Critical care time: NA     Levy Pupa, MD, PhD 03/02/2023, 10:26 AM [facility] [phone] or if no answer before 7:00PM call [phone] For any issues after 7:00PM please call eLink [phone]

## 2023-03-02 NOTE — Progress Notes (Signed)
Full                                                        +---------+---------------+---------+-----------+----------+--------------+ EIV                     Yes      Yes                                 +---------+---------------+---------+-----------+----------+--------------+     Summary: RIGHT: - Findings consistent with acute deep vein thrombosis involving the right common femoral vein, right femoral vein, and right popliteal vein.  - No cystic structure found in the popliteal fossa. - The external iliac vein appears patent.  LEFT: - Findings consistent with acute deep vein thrombosis involving the left common femoral vein, and left proximal profunda vein.  - No cystic structure found in the popliteal fossa. - The external iliac vein appears patent.  *See table(s) above for measurements and observations. Electronically signed by Coral Else MD on 03/02/2023 at 11:31:49 AM.    Final    ECHOCARDIOGRAM COMPLETE  Result Date: 03/02/2023    ECHOCARDIOGRAM REPORT   Patient Name:   Hayley Wu Date of Exam: 03/02/2023 Medical Rec #:  [id number]       Height:       63.0 in Accession #:    [id number]      Weight:       147.3 lb Date of Birth:  1950-07-16      BSA:          1.698 m Patient Age:    72 years        BP:            111/61 mmHg Patient Gender: F               HR:           93 bpm. Exam Location:  Inpatient Procedure: 2D Echo, Color Doppler and Cardiac Doppler Indications:    Cardiomegaly I51.7  History:        Patient has no prior history of Echocardiogram examinations.                 Risk Factors:Hypertension.  Sonographer:    Harriette Bouillon RDCS Referring Phys: [phone] DAVID MANUEL ORTIZ IMPRESSIONS  1. Left ventricular ejection fraction, by estimation, is 55 to 60%. The left ventricle has normal function. The left ventricle has no regional wall motion abnormalities. Left ventricular diastolic parameters are consistent with Grade I diastolic dysfunction (impaired relaxation).  2. Right ventricular systolic function is normal. The right ventricular size is normal. There is normal pulmonary artery systolic pressure.  3. Left atrial size was mildly dilated.  4. The mitral valve is normal in structure. Mild mitral valve regurgitation. No evidence of mitral stenosis.  5. The aortic valve is tricuspid. Aortic valve regurgitation is not visualized. No aortic stenosis is present.  6. The inferior vena cava is normal in size with greater than 50% respiratory variability, suggesting right atrial pressure of 3 mmHg. FINDINGS  Left Ventricle: Left ventricular ejection fraction, by estimation, is 55 to 60%. The left ventricle has normal function. The left ventricle has no regional wall motion abnormalities. The left ventricular internal cavity  Full                                                        +---------+---------------+---------+-----------+----------+--------------+ EIV                     Yes      Yes                                 +---------+---------------+---------+-----------+----------+--------------+     Summary: RIGHT: - Findings consistent with acute deep vein thrombosis involving the right common femoral vein, right femoral vein, and right popliteal vein.  - No cystic structure found in the popliteal fossa. - The external iliac vein appears patent.  LEFT: - Findings consistent with acute deep vein thrombosis involving the left common femoral vein, and left proximal profunda vein.  - No cystic structure found in the popliteal fossa. - The external iliac vein appears patent.  *See table(s) above for measurements and observations. Electronically signed by Coral Else MD on 03/02/2023 at 11:31:49 AM.    Final    ECHOCARDIOGRAM COMPLETE  Result Date: 03/02/2023    ECHOCARDIOGRAM REPORT   Patient Name:   Hayley Wu Date of Exam: 03/02/2023 Medical Rec #:  [id number]       Height:       63.0 in Accession #:    [id number]      Weight:       147.3 lb Date of Birth:  1950-07-16      BSA:          1.698 m Patient Age:    72 years        BP:            111/61 mmHg Patient Gender: F               HR:           93 bpm. Exam Location:  Inpatient Procedure: 2D Echo, Color Doppler and Cardiac Doppler Indications:    Cardiomegaly I51.7  History:        Patient has no prior history of Echocardiogram examinations.                 Risk Factors:Hypertension.  Sonographer:    Harriette Bouillon RDCS Referring Phys: [phone] DAVID MANUEL ORTIZ IMPRESSIONS  1. Left ventricular ejection fraction, by estimation, is 55 to 60%. The left ventricle has normal function. The left ventricle has no regional wall motion abnormalities. Left ventricular diastolic parameters are consistent with Grade I diastolic dysfunction (impaired relaxation).  2. Right ventricular systolic function is normal. The right ventricular size is normal. There is normal pulmonary artery systolic pressure.  3. Left atrial size was mildly dilated.  4. The mitral valve is normal in structure. Mild mitral valve regurgitation. No evidence of mitral stenosis.  5. The aortic valve is tricuspid. Aortic valve regurgitation is not visualized. No aortic stenosis is present.  6. The inferior vena cava is normal in size with greater than 50% respiratory variability, suggesting right atrial pressure of 3 mmHg. FINDINGS  Left Ventricle: Left ventricular ejection fraction, by estimation, is 55 to 60%. The left ventricle has normal function. The left ventricle has no regional wall motion abnormalities. The left ventricular internal cavity  Full                                                        +---------+---------------+---------+-----------+----------+--------------+ EIV                     Yes      Yes                                 +---------+---------------+---------+-----------+----------+--------------+     Summary: RIGHT: - Findings consistent with acute deep vein thrombosis involving the right common femoral vein, right femoral vein, and right popliteal vein.  - No cystic structure found in the popliteal fossa. - The external iliac vein appears patent.  LEFT: - Findings consistent with acute deep vein thrombosis involving the left common femoral vein, and left proximal profunda vein.  - No cystic structure found in the popliteal fossa. - The external iliac vein appears patent.  *See table(s) above for measurements and observations. Electronically signed by Coral Else MD on 03/02/2023 at 11:31:49 AM.    Final    ECHOCARDIOGRAM COMPLETE  Result Date: 03/02/2023    ECHOCARDIOGRAM REPORT   Patient Name:   Hayley Wu Date of Exam: 03/02/2023 Medical Rec #:  [id number]       Height:       63.0 in Accession #:    [id number]      Weight:       147.3 lb Date of Birth:  1950-07-16      BSA:          1.698 m Patient Age:    72 years        BP:            111/61 mmHg Patient Gender: F               HR:           93 bpm. Exam Location:  Inpatient Procedure: 2D Echo, Color Doppler and Cardiac Doppler Indications:    Cardiomegaly I51.7  History:        Patient has no prior history of Echocardiogram examinations.                 Risk Factors:Hypertension.  Sonographer:    Harriette Bouillon RDCS Referring Phys: [phone] DAVID MANUEL ORTIZ IMPRESSIONS  1. Left ventricular ejection fraction, by estimation, is 55 to 60%. The left ventricle has normal function. The left ventricle has no regional wall motion abnormalities. Left ventricular diastolic parameters are consistent with Grade I diastolic dysfunction (impaired relaxation).  2. Right ventricular systolic function is normal. The right ventricular size is normal. There is normal pulmonary artery systolic pressure.  3. Left atrial size was mildly dilated.  4. The mitral valve is normal in structure. Mild mitral valve regurgitation. No evidence of mitral stenosis.  5. The aortic valve is tricuspid. Aortic valve regurgitation is not visualized. No aortic stenosis is present.  6. The inferior vena cava is normal in size with greater than 50% respiratory variability, suggesting right atrial pressure of 3 mmHg. FINDINGS  Left Ventricle: Left ventricular ejection fraction, by estimation, is 55 to 60%. The left ventricle has normal function. The left ventricle has no regional wall motion abnormalities. The left ventricular internal cavity  NO GROWTH < 24 HOURS Performed at [facility], [address], [place], [place] [address]    REPTSTATUS PENDING 03/01/2023 1914    Radiology Studies: VAS [place] LOWER EXTREMITY VENOUS (DVT)  Result Date: 03/02/2023  Lower Venous DVT Study Patient Name:  Hayley Wu  Date of Exam:   03/02/2023 Medical Rec #: [id number]        Accession #:    [id number] Date of Birth: [birth year]       Patient Gender: F Patient Age:   77 years Exam Location:  [facility] Procedure:      VAS [place] LOWER EXTREMITY VENOUS (DVT) Referring Phys: DAVID ORTIZ --------------------------------------------------------------------------------  Indications: Edema.  Risk Factors: Cancer. Limitations: Poor ultrasound/tissue interface. Comparison Study: No prior studies. Performing Technologist: Chanda Busing RVT  Examination Guidelines: A complete evaluation includes B-mode imaging, spectral Doppler, color Doppler, and power Doppler as needed of all accessible portions of each vessel. Bilateral testing is considered an integral part of a complete examination. Limited examinations for reoccurring indications may be performed as noted. The reflux portion of the exam is performed with the patient in reverse Trendelenburg.  +---------+---------------+---------+-----------+----------+--------------+ RIGHT    CompressibilityPhasicitySpontaneityPropertiesThrombus Aging +---------+---------------+---------+-----------+----------+--------------+ CFV      Partial        Yes      Yes                  Acute          +---------+---------------+---------+-----------+----------+--------------+ SFJ      Partial                                      Acute          +---------+---------------+---------+-----------+----------+--------------+ FV Prox  Partial        Yes      Yes                  Acute           +---------+---------------+---------+-----------+----------+--------------+ FV Mid   Full                                                        +---------+---------------+---------+-----------+----------+--------------+ FV DistalPartial        Yes      Yes                  Acute          +---------+---------------+---------+-----------+----------+--------------+ PFV      Full                                                        +---------+---------------+---------+-----------+----------+--------------+ POP      None           No       No                   Acute          +---------+---------------+---------+-----------+----------+--------------+ PTV      Full                                                        +---------+---------------+---------+-----------+----------+--------------+  PROGRESS NOTE Hayley Wu  ZOX:[id number] DOB: 03-Sep-1950 DOA: 03/01/2023 PCP: [name], PA-C  Brief Narrative/Hospital Course: 89 yof w/ history significant of seasonal allergies, hypertension, tobacco use, hypothyroidism, constipation 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 Patient also having cough occasionally productive, mild wheezing.  She has been prescribed mirtazapine recently for low appetite. In the ED: Hypoxic 50% on room air placed on nonrebreather.  Labs showed hypokalemia stable renal function albumin 2.8 Pro-Cal less than 0.1 lactic acid 0.9 BNP 152 CBC with hemoglobin 10.8 influenza COVID-19 screen negative, UA with WBC more than 50 large leukocytes. chest x-ray>> bilateral opacities right more than left concerning for pneumonia. CTA>> occluded left lower lobe pulmonary artery branch since 7/14, stable collapse of T7 with paraspinal soft tissue density and acute kyphosis, interval change of CHF, cardiomegaly interstitial pulmonary edema minimal right pleural effusion known primary lung carcinoma 2.9 cm right lower lobe stable and large lymph nodes, stable left humeral head sclerotic metastasis or infarct, COPD. Patient was wheezing and having rales on exam.  Placed on empiric antibiotic and admitted   Subjective: Patient seen and examined this morning Multiple family at the bedside  Since admission during night having  worsening respiratory status needing HHFNC, WJX:BJYNWG multilobar bilateral bronchopneumonia  Assessment and Plan: Principal Problem:   Acute respiratory failure with hypoxia (HCC) Active Problems:   Hypokalemia   Hypertension   Hypothyroidism   Adenocarcinoma of right lung, stage 4 (HCC)   Constipation   Normocytic anemia   Moderate protein malnutrition  (HCC)   Occlusion of left pulmonary artery (HCC)   Cardiomegaly   Pleural effusion due to CHF (congestive heart failure) (HCC)   Acute respiratory distress with acute hypoxic respiratory failure Multifocal bilateral bronchopneumonia Adenocarcinoma of right lung stage IV: Imaging shows bony spinal mets pulmonary nodules and lymphadenopathy along with bilateral multifocal opacities Differential includes pneumonia/OI, pneumonitis due to either radiation or immunotherapy, cardiogenic pulmonary edema Patient currently getting palliative chemotherapy by Dr. Arbutus Ped initially on nonrebreather, needing HHFNC since admission. I have consulted PCM this morning, Cont nebs,empiric antibiotic follow-up blood cultures sputum culture.  BNP stable echo pending, Pro-Cal is less than 0.1 interestingly.Does have acute DVT on the left leg.  Will notify Dr. Arbutus Ped tomorrow.  Chemotherapy on hold for now. Patient has started on solu-Medrol  Cardiomegaly Finding concerning for CHF on imaging: Follow-up echo.  Patient needed 11 for soft BP on admission.  BNP elevated some 162>264  Anemia Acute drop in hb.  Baseline 12 g on admission 10.8 and decreased to 1 point, monitor.  On Eliquis  Acute bilateral LE DVT left common femoral and left proximal profunda vein on prelim report 9/22 Occluded LLL pulm artery was since July: As per oncology Eliquis therapeutic dose has been started  Hypertension: BP had been soft holding meds  Hypothyroidism: Continue levothyroxine  Hypokalemia: Resolved  Constipation: Continue stool softener  Protein malnutrition moderate: Augment diet  Goals of care: I discussed CODE STATUS with the patient stated she is DNR no CPR or intubation, patient's 2 daughters at the bedside In agreement.  DVT prophylaxis: Eliquis Code Status:   Code Status: Limited: Do not attempt resuscitation (DNR) -DNR-LIMITED -Do Not Intubate/DNI  Family Communication: plan of care discussed with  patient at bedside. Patient status is: Inpatient because of respiratory failure Level of  NO GROWTH < 24 HOURS Performed at [facility], [address], [place], [place] [address]    REPTSTATUS PENDING 03/01/2023 1914    Radiology Studies: VAS [place] LOWER EXTREMITY VENOUS (DVT)  Result Date: 03/02/2023  Lower Venous DVT Study Patient Name:  Hayley Wu  Date of Exam:   03/02/2023 Medical Rec #: [id number]        Accession #:    [id number] Date of Birth: [birth year]       Patient Gender: F Patient Age:   77 years Exam Location:  [facility] Procedure:      VAS [place] LOWER EXTREMITY VENOUS (DVT) Referring Phys: DAVID ORTIZ --------------------------------------------------------------------------------  Indications: Edema.  Risk Factors: Cancer. Limitations: Poor ultrasound/tissue interface. Comparison Study: No prior studies. Performing Technologist: Chanda Busing RVT  Examination Guidelines: A complete evaluation includes B-mode imaging, spectral Doppler, color Doppler, and power Doppler as needed of all accessible portions of each vessel. Bilateral testing is considered an integral part of a complete examination. Limited examinations for reoccurring indications may be performed as noted. The reflux portion of the exam is performed with the patient in reverse Trendelenburg.  +---------+---------------+---------+-----------+----------+--------------+ RIGHT    CompressibilityPhasicitySpontaneityPropertiesThrombus Aging +---------+---------------+---------+-----------+----------+--------------+ CFV      Partial        Yes      Yes                  Acute          +---------+---------------+---------+-----------+----------+--------------+ SFJ      Partial                                      Acute          +---------+---------------+---------+-----------+----------+--------------+ FV Prox  Partial        Yes      Yes                  Acute           +---------+---------------+---------+-----------+----------+--------------+ FV Mid   Full                                                        +---------+---------------+---------+-----------+----------+--------------+ FV DistalPartial        Yes      Yes                  Acute          +---------+---------------+---------+-----------+----------+--------------+ PFV      Full                                                        +---------+---------------+---------+-----------+----------+--------------+ POP      None           No       No                   Acute          +---------+---------------+---------+-----------+----------+--------------+ PTV      Full                                                        +---------+---------------+---------+-----------+----------+--------------+  NO GROWTH < 24 HOURS Performed at [facility], [address], [place], [place] [address]    REPTSTATUS PENDING 03/01/2023 1914    Radiology Studies: VAS [place] LOWER EXTREMITY VENOUS (DVT)  Result Date: 03/02/2023  Lower Venous DVT Study Patient Name:  Hayley Wu  Date of Exam:   03/02/2023 Medical Rec #: [id number]        Accession #:    [id number] Date of Birth: [birth year]       Patient Gender: F Patient Age:   77 years Exam Location:  [facility] Procedure:      VAS [place] LOWER EXTREMITY VENOUS (DVT) Referring Phys: DAVID ORTIZ --------------------------------------------------------------------------------  Indications: Edema.  Risk Factors: Cancer. Limitations: Poor ultrasound/tissue interface. Comparison Study: No prior studies. Performing Technologist: Chanda Busing RVT  Examination Guidelines: A complete evaluation includes B-mode imaging, spectral Doppler, color Doppler, and power Doppler as needed of all accessible portions of each vessel. Bilateral testing is considered an integral part of a complete examination. Limited examinations for reoccurring indications may be performed as noted. The reflux portion of the exam is performed with the patient in reverse Trendelenburg.  +---------+---------------+---------+-----------+----------+--------------+ RIGHT    CompressibilityPhasicitySpontaneityPropertiesThrombus Aging +---------+---------------+---------+-----------+----------+--------------+ CFV      Partial        Yes      Yes                  Acute          +---------+---------------+---------+-----------+----------+--------------+ SFJ      Partial                                      Acute          +---------+---------------+---------+-----------+----------+--------------+ FV Prox  Partial        Yes      Yes                  Acute           +---------+---------------+---------+-----------+----------+--------------+ FV Mid   Full                                                        +---------+---------------+---------+-----------+----------+--------------+ FV DistalPartial        Yes      Yes                  Acute          +---------+---------------+---------+-----------+----------+--------------+ PFV      Full                                                        +---------+---------------+---------+-----------+----------+--------------+ POP      None           No       No                   Acute          +---------+---------------+---------+-----------+----------+--------------+ PTV      Full                                                        +---------+---------------+---------+-----------+----------+--------------+  NO GROWTH < 24 HOURS Performed at [facility], [address], [place], [place] [address]    REPTSTATUS PENDING 03/01/2023 1914    Radiology Studies: VAS [place] LOWER EXTREMITY VENOUS (DVT)  Result Date: 03/02/2023  Lower Venous DVT Study Patient Name:  Hayley Wu  Date of Exam:   03/02/2023 Medical Rec #: [id number]        Accession #:    [id number] Date of Birth: [birth year]       Patient Gender: F Patient Age:   77 years Exam Location:  [facility] Procedure:      VAS [place] LOWER EXTREMITY VENOUS (DVT) Referring Phys: DAVID ORTIZ --------------------------------------------------------------------------------  Indications: Edema.  Risk Factors: Cancer. Limitations: Poor ultrasound/tissue interface. Comparison Study: No prior studies. Performing Technologist: Chanda Busing RVT  Examination Guidelines: A complete evaluation includes B-mode imaging, spectral Doppler, color Doppler, and power Doppler as needed of all accessible portions of each vessel. Bilateral testing is considered an integral part of a complete examination. Limited examinations for reoccurring indications may be performed as noted. The reflux portion of the exam is performed with the patient in reverse Trendelenburg.  +---------+---------------+---------+-----------+----------+--------------+ RIGHT    CompressibilityPhasicitySpontaneityPropertiesThrombus Aging +---------+---------------+---------+-----------+----------+--------------+ CFV      Partial        Yes      Yes                  Acute          +---------+---------------+---------+-----------+----------+--------------+ SFJ      Partial                                      Acute          +---------+---------------+---------+-----------+----------+--------------+ FV Prox  Partial        Yes      Yes                  Acute           +---------+---------------+---------+-----------+----------+--------------+ FV Mid   Full                                                        +---------+---------------+---------+-----------+----------+--------------+ FV DistalPartial        Yes      Yes                  Acute          +---------+---------------+---------+-----------+----------+--------------+ PFV      Full                                                        +---------+---------------+---------+-----------+----------+--------------+ POP      None           No       No                   Acute          +---------+---------------+---------+-----------+----------+--------------+ PTV      Full                                                        +---------+---------------+---------+-----------+----------+--------------+  NO GROWTH < 24 HOURS Performed at [facility], [address], [place], [place] [address]    REPTSTATUS PENDING 03/01/2023 1914    Radiology Studies: VAS [place] LOWER EXTREMITY VENOUS (DVT)  Result Date: 03/02/2023  Lower Venous DVT Study Patient Name:  Hayley Wu  Date of Exam:   03/02/2023 Medical Rec #: [id number]        Accession #:    [id number] Date of Birth: [birth year]       Patient Gender: F Patient Age:   77 years Exam Location:  [facility] Procedure:      VAS [place] LOWER EXTREMITY VENOUS (DVT) Referring Phys: DAVID ORTIZ --------------------------------------------------------------------------------  Indications: Edema.  Risk Factors: Cancer. Limitations: Poor ultrasound/tissue interface. Comparison Study: No prior studies. Performing Technologist: Chanda Busing RVT  Examination Guidelines: A complete evaluation includes B-mode imaging, spectral Doppler, color Doppler, and power Doppler as needed of all accessible portions of each vessel. Bilateral testing is considered an integral part of a complete examination. Limited examinations for reoccurring indications may be performed as noted. The reflux portion of the exam is performed with the patient in reverse Trendelenburg.  +---------+---------------+---------+-----------+----------+--------------+ RIGHT    CompressibilityPhasicitySpontaneityPropertiesThrombus Aging +---------+---------------+---------+-----------+----------+--------------+ CFV      Partial        Yes      Yes                  Acute          +---------+---------------+---------+-----------+----------+--------------+ SFJ      Partial                                      Acute          +---------+---------------+---------+-----------+----------+--------------+ FV Prox  Partial        Yes      Yes                  Acute           +---------+---------------+---------+-----------+----------+--------------+ FV Mid   Full                                                        +---------+---------------+---------+-----------+----------+--------------+ FV DistalPartial        Yes      Yes                  Acute          +---------+---------------+---------+-----------+----------+--------------+ PFV      Full                                                        +---------+---------------+---------+-----------+----------+--------------+ POP      None           No       No                   Acute          +---------+---------------+---------+-----------+----------+--------------+ PTV      Full                                                        +---------+---------------+---------+-----------+----------+--------------+  NO GROWTH < 24 HOURS Performed at [facility], [address], [place], [place] [address]    REPTSTATUS PENDING 03/01/2023 1914    Radiology Studies: VAS [place] LOWER EXTREMITY VENOUS (DVT)  Result Date: 03/02/2023  Lower Venous DVT Study Patient Name:  Hayley Wu  Date of Exam:   03/02/2023 Medical Rec #: [id number]        Accession #:    [id number] Date of Birth: [birth year]       Patient Gender: F Patient Age:   77 years Exam Location:  [facility] Procedure:      VAS [place] LOWER EXTREMITY VENOUS (DVT) Referring Phys: DAVID ORTIZ --------------------------------------------------------------------------------  Indications: Edema.  Risk Factors: Cancer. Limitations: Poor ultrasound/tissue interface. Comparison Study: No prior studies. Performing Technologist: Chanda Busing RVT  Examination Guidelines: A complete evaluation includes B-mode imaging, spectral Doppler, color Doppler, and power Doppler as needed of all accessible portions of each vessel. Bilateral testing is considered an integral part of a complete examination. Limited examinations for reoccurring indications may be performed as noted. The reflux portion of the exam is performed with the patient in reverse Trendelenburg.  +---------+---------------+---------+-----------+----------+--------------+ RIGHT    CompressibilityPhasicitySpontaneityPropertiesThrombus Aging +---------+---------------+---------+-----------+----------+--------------+ CFV      Partial        Yes      Yes                  Acute          +---------+---------------+---------+-----------+----------+--------------+ SFJ      Partial                                      Acute          +---------+---------------+---------+-----------+----------+--------------+ FV Prox  Partial        Yes      Yes                  Acute           +---------+---------------+---------+-----------+----------+--------------+ FV Mid   Full                                                        +---------+---------------+---------+-----------+----------+--------------+ FV DistalPartial        Yes      Yes                  Acute          +---------+---------------+---------+-----------+----------+--------------+ PFV      Full                                                        +---------+---------------+---------+-----------+----------+--------------+ POP      None           No       No                   Acute          +---------+---------------+---------+-----------+----------+--------------+ PTV      Full                                                        +---------+---------------+---------+-----------+----------+--------------+

## 2023-03-03 ENCOUNTER — Ambulatory Visit: Payer: PPO

## 2023-03-03 ENCOUNTER — Other Ambulatory Visit: Payer: PPO

## 2023-03-03 ENCOUNTER — Ambulatory Visit: Payer: PPO | Admitting: Internal Medicine

## 2023-03-03 ENCOUNTER — Ambulatory Visit (HOSPITAL_COMMUNITY): Admission: RE | Admit: 2023-03-03 | Payer: PPO | Source: Ambulatory Visit

## 2023-03-03 DIAGNOSIS — J9601 Acute respiratory failure with hypoxia: Secondary | ICD-10-CM | POA: Diagnosis not present

## 2023-03-03 LAB — BASIC METABOLIC PANEL
Anion gap: 9 (ref 5–15)
BUN: 17 mg/dL (ref 8–23)
CO2: 29 mmol/L (ref 22–32)
Calcium: 8.5 mg/dL — ABNORMAL LOW (ref 8.9–10.3)
Chloride: 102 mmol/L (ref 98–111)
Creatinine, Ser: 0.46 mg/dL (ref 0.44–1.00)
GFR, Estimated: >60 mL/min (ref >60)
Glucose, Bld: 162 mg/dL — ABNORMAL HIGH (ref 70–99)
Potassium: 3.3 mmol/L — ABNORMAL LOW (ref 3.5–5.1)
Sodium: 140 mmol/L (ref 135–145)

## 2023-03-03 LAB — CBC
HCT: 36 % (ref 36.0–46.0)
Hemoglobin: 11.4 g/dL — ABNORMAL LOW (ref 12.0–15.0)
MCH: 30.6 pg (ref 26.0–34.0)
MCHC: 31.7 g/dL (ref 30.0–36.0)
MCV: 96.5 fL (ref 80.0–100.0)
Platelets: 185 10*3/uL (ref 150–400)
RBC: 3.73 MIL/uL — ABNORMAL LOW (ref 3.87–5.11)
RDW: 14.9 % (ref 11.5–15.5)
WBC: 4.6 10*3/uL (ref 4.0–10.5)
nRBC: 0 % (ref 0.0–0.2)

## 2023-03-03 MED ORDER — POTASSIUM CHLORIDE CRYS ER 20 MEQ PO TBCR
40.0000 meq | EXTENDED_RELEASE_TABLET | Freq: Once | ORAL | Status: AC
  Administered 2023-03-03: 40 meq via ORAL
  Filled 2023-03-03: qty 2

## 2023-03-03 MED ORDER — POTASSIUM CHLORIDE CRYS ER 20 MEQ PO TBCR: 40.0000 meq | EXTENDED_RELEASE_TABLET | Freq: Once | ORAL | Status: DC

## 2023-03-03 NOTE — Progress Notes (Signed)
   03/03/23 1315  Oxygen Therapy/Pulse Ox  O2 Device HHFNC  O2 Therapy Oxygen humidified  Heater temperature 87.8 F (31 C)  O2 Flow Rate (L/min) (S)  35 L/min (Weaned to 35 L.)  FiO2 (%) (S)  55 % (Weaned to 55 %.)  SpO2 94 %  Safety Instructions Yes (Comment)

## 2023-03-03 NOTE — Progress Notes (Signed)
NAME:  Hayley Wu, MRN:  [id number], DOB:  07-19-1950, LOS: 2 ADMISSION DATE:  03/01/2023, CONSULTATION DATE: 03/02/2023 REFERRING MD: Dr. Jonathon Bellows, Reason for consultation: Hypoxemia  History of Present Illness:  72 year old woman with a history of stage IV adenocarcinoma lung that presented with bilateral pulmonary nodules, right hilar and mediastinal adenopathy, hepatic and bony metastases (09/2022) required decompressive T7 laminectomy due to spinal cord compression and epidural mass.  Received palliative radiation T7, completed 10/25/2022 as well as chemotherapy, maintenance immunotherapy with Alimta and Keytruda (Dr. Arbutus Ped, last seen 02/11/2023). She had a surveillance CT CAP on 9/19 that showed suspected bilateral femoral filling defects, a newly identified subacute left pulmonary embolism.  Eliquis was started on 9/20.  She was admitted 9/21 with dyspnea, hypoxemia, lethargy and low-grade fever, minimally productive cough.  No clear sick exposures.  COVID-19, flu and RSV were all negative.  Pertinent  Medical History   Past Medical History:  Diagnosis Date   Allergy    Contact lens/glasses fitting    Hypertension   Stage IV adenocarcinoma lung with hepatic and bony metastases Hypertension Hypothyroidism  Significant Hospital Events: Including procedures, antibiotic start and stop dates in addition to other pertinent events   9/19 CT chest, abdomen, pelvis >> stable spiculated right lower lobe and right upper lobe nodules (smaller than original diagnosis), decrease right hepatic lesion, unchanged sclerotic osseous metastatic disease, new groundglass opacities medial bilateral lower lobes with some associated consolidation.  Question bilateral common femoral filling defects concerning for DVT 9/21 CT-PA >> left lower lobe PA pulmonary embolism, in retrospect may have been present as far back as 12/22/2022.  Diffuse increase in bilateral interstitial infiltrates.  Other findings  stable 9/22 echocardiogram >> normal LV and RV function  Interim History / Subjective:   Remains critically ill, on heated high flow nasal cannula On 80% / 55 L Diuresed 3 L  Objective   Blood pressure 113/64, pulse 91, temperature 97.9 F (36.6 C), temperature source Oral, resp. rate (!) 22, height 5\' 3"  (1.6 m), weight 66.8 kg, SpO2 98%.    FiO2 (%):  [80 %] 80 %   Intake/Output Summary (Last 24 hours) at 03/03/2023 1044 Last data filed at 03/03/2023 0500 Gross per 24 hour  Intake 446.52 ml  Output 2550 ml  Net -2103.48 ml   Filed Weights   03/01/23 0844 03/01/23 2000  Weight: 67.1 kg 66.8 kg    Examination: General: Elderly woman, sitting up in bed, no distress HENT: Oropharynx clear, strong voice, no secretions, no stridor Lungs: Bilateral posterior crackles, no accessory muscle use, no rhonchi Cardiovascular: S1-S2 regular, no murmur Abdomen: Nondistended positive bowel sounds Extremities: Trace pretibial edema, warm and well-perfused Neuro: Wake, alert, interacting appropriately, nonfocal GU: Deferred  Labs show mild hypokalemia, no leukocytosis, negative procalcitonin  Resolved Hospital Problem list     Assessment & Plan:   Acute respiratory failure with hypoxia New bilateral pulmonary infiltrates, differential includes pneumonia/opportunistic infection, pneumonitis due to either her radiation or her immunotherapy Echo shows normal LV and RV function  -Cultures negative so far, negative procalcitonin, can DC antibiotics soon -Continue day 2 steroids>> Solu-Medrol 125 mg twice daily x 3 days and then change to 60mg  every day, eventually to Prednisone for 3 weeks -Depending on workup may need to consider change from her current immunotherapy.  Will need to discuss with Oncology.   Newly identified subacute left pulmonary embolism -Continue Eliquis, duplex confirms acute DVT in right common femoral vein, right femoral and popliteal  vein  Hypertension Hypothyroidism Constipation Normocytic anemia Moderate protein malnutrition -As per [facility] plans   Best Practice (right click and "Reselect all SmartList Selections" daily)   Diet/type: Regular consistency (see orders) DVT prophylaxis: DOAC GI prophylaxis: N/A Lines: N/A Foley:  N/A Code Status:  full code Last date of multidisciplinary goals of care discussion [pending] updated patient and family at bedside  Labs   CBC: Recent Labs  Lab 03/01/23 0927 03/02/23 0455 03/03/23 0336  WBC 7.5 6.0 4.6  NEUTROABS 6.3  --   --   HGB 10.8* 9.8* 11.4*  HCT 34.0* 32.1* 36.0  MCV 95.8 98.5 96.5  PLT 234 176 185    Basic Metabolic Panel: Recent Labs  Lab 03/01/23 0927 03/02/23 0455 03/03/23 0336  NA 137 142 140  K 3.0* 3.8 3.3*  CL 102 107 102  CO2 24 26 29   GLUCOSE 101* 98 162*  BUN 23 16 17   CREATININE 0.52 0.37* 0.46  CALCIUM 8.1* 8.4* 8.5*  MG 2.2  --   --    GFR: Estimated Creatinine Clearance: 60.1 mL/min (by C-G formula based on SCr of 0.46 mg/dL). Recent Labs  Lab 03/01/23 0927 03/01/23 0942 03/02/23 0455 03/03/23 0336  PROCALCITON <0.10  --  <0.10  --   WBC 7.5  --  6.0 4.6  LATICACIDVEN  --  0.9  --   --     Liver Function Tests: Recent Labs  Lab 03/01/23 0927 03/02/23 0455  AST 20 17  ALT 18 13  ALKPHOS 79 59  BILITOT 1.0 1.0  PROT 6.0* 5.8*  ALBUMIN 2.8* 3.3*   No results for input(s): "LIPASE", "AMYLASE" in the last 168 hours. No results for input(s): "AMMONIA" in the last 168 hours.  ABG    Component Value Date/Time   HCO3 29.1 (H) 03/02/2023 0455   O2SAT 78.6 03/02/2023 0455     Coagulation Profile: Recent Labs  Lab 03/01/23 0927  INR 1.2    Cardiac Enzymes: No results for input(s): "CKTOTAL", "CKMB", "CKMBINDEX", "TROPONINI" in the last 168 hours.  HbA1C: No results found for: "HGBA1C"  CBG: Recent Labs  Lab 03/02/23 0756  GLUCAP 94    Yanissa Michalsky V. Vassie Loll MD 03/03/2023, 10:44 AM [facility] [phone] or if no answer before 7:00PM call [phone] For any issues after 7:00PM please call eLink [phone]

## 2023-03-03 NOTE — Plan of Care (Signed)

## 2023-03-03 NOTE — Progress Notes (Signed)
PTV      Full                                                        +---------+---------------+---------+-----------+----------+--------------+ PERO     Full                                                        +---------+---------------+---------+-----------+----------+--------------+ EIV                     Yes      Yes                                 +---------+---------------+---------+-----------+----------+--------------+     Summary: RIGHT: - Findings consistent with acute deep vein thrombosis involving the right common femoral vein, right femoral vein, and right popliteal vein.  - No cystic structure found in the popliteal fossa. - The external iliac vein appears patent.  LEFT: - Findings consistent with acute deep vein thrombosis involving the left common femoral vein, and left proximal profunda vein.  - No cystic structure found in the popliteal fossa. - The external iliac vein appears patent.  *See table(s) above for measurements and observations. Electronically signed by Coral Else MD on 03/02/2023 at 11:31:49 AM.    Final    ECHOCARDIOGRAM COMPLETE  Result Date: 03/02/2023    ECHOCARDIOGRAM REPORT   Patient Name:   YULIZA PISCIOTTA Date of Exam: 03/02/2023 Medical Rec #:  [id number]       Height:        63.0 in Accession #:    [id number]      Weight:       147.3 lb Date of Birth:  [birth year]      BSA:          1.698 m Patient Age:    72 years        BP:           111/61 mmHg Patient Gender: F               HR:           93 bpm. Exam Location:  Inpatient Procedure: 2D Echo, Color Doppler and Cardiac Doppler Indications:    Cardiomegaly I51.7  History:        Patient has no prior history of Echocardiogram examinations.                 Risk Factors:Hypertension.  Sonographer:    Harriette Bouillon RDCS Referring Phys: [id number] DAVID MANUEL ORTIZ IMPRESSIONS  1. Left ventricular ejection fraction, by estimation, is 55 to 60%. The left ventricle has normal function. The left ventricle has no regional wall motion abnormalities. Left ventricular diastolic parameters are consistent with Grade I diastolic dysfunction (impaired relaxation).  2. Right ventricular systolic function is normal. The right ventricular size is normal. There is normal pulmonary artery systolic pressure.  3. Left atrial size was mildly dilated.  4. The mitral valve is normal in structure. Mild mitral valve regurgitation. No evidence of mitral stenosis.  5. The aortic valve is tricuspid.  PTV      Full                                                        +---------+---------------+---------+-----------+----------+--------------+ PERO     Full                                                        +---------+---------------+---------+-----------+----------+--------------+ EIV                     Yes      Yes                                 +---------+---------------+---------+-----------+----------+--------------+     Summary: RIGHT: - Findings consistent with acute deep vein thrombosis involving the right common femoral vein, right femoral vein, and right popliteal vein.  - No cystic structure found in the popliteal fossa. - The external iliac vein appears patent.  LEFT: - Findings consistent with acute deep vein thrombosis involving the left common femoral vein, and left proximal profunda vein.  - No cystic structure found in the popliteal fossa. - The external iliac vein appears patent.  *See table(s) above for measurements and observations. Electronically signed by Coral Else MD on 03/02/2023 at 11:31:49 AM.    Final    ECHOCARDIOGRAM COMPLETE  Result Date: 03/02/2023    ECHOCARDIOGRAM REPORT   Patient Name:   YULIZA PISCIOTTA Date of Exam: 03/02/2023 Medical Rec #:  [id number]       Height:        63.0 in Accession #:    [id number]      Weight:       147.3 lb Date of Birth:  [birth year]      BSA:          1.698 m Patient Age:    72 years        BP:           111/61 mmHg Patient Gender: F               HR:           93 bpm. Exam Location:  Inpatient Procedure: 2D Echo, Color Doppler and Cardiac Doppler Indications:    Cardiomegaly I51.7  History:        Patient has no prior history of Echocardiogram examinations.                 Risk Factors:Hypertension.  Sonographer:    Harriette Bouillon RDCS Referring Phys: [id number] DAVID MANUEL ORTIZ IMPRESSIONS  1. Left ventricular ejection fraction, by estimation, is 55 to 60%. The left ventricle has normal function. The left ventricle has no regional wall motion abnormalities. Left ventricular diastolic parameters are consistent with Grade I diastolic dysfunction (impaired relaxation).  2. Right ventricular systolic function is normal. The right ventricular size is normal. There is normal pulmonary artery systolic pressure.  3. Left atrial size was mildly dilated.  4. The mitral valve is normal in structure. Mild mitral valve regurgitation. No evidence of mitral stenosis.  5. The aortic valve is tricuspid.  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 Patient also having cough occasionally productive, mild wheezing.  She has been prescribed mirtazapine recently for low appetite. In the ED: Hypoxic 50% on room air placed on nonrebreather.  Labs showed hypokalemia stable renal function albumin 2.8 Pro-Cal less than 0.1 lactic acid 0.9 BNP 152 CBC with hemoglobin 10.8 influenza COVID-19 screen negative, UA with WBC more than 50 large leukocytes. chest x-ray>> bilateral opacities right more than left concerning for pneumonia. CTA>> occluded left lower lobe pulmonary artery branch since 7/14, stable collapse of T7 with paraspinal soft tissue density and acute kyphosis, interval change of CHF, cardiomegaly interstitial pulmonary edema minimal right pleural effusion known primary lung carcinoma 2.9 cm right lower lobe stable and large lymph nodes, stable left humeral head sclerotic metastasis or infarct, COPD. Patient was wheezing and having rales on exam.  Placed on empiric antibiotic and admitted PCCM was consulted due to worsening hypoxia, echo obtained shows EF 55 to 60% G1 DD normal RV systolic function mitral valve normal aortic valve tricuspid. 9/22 Started on a steroid.   Subjective: Seen and examined Resting comfortably mild cough No chest pain nausea or vomiting, no fever, on 55L HFNC Daughter at bedside LABS with mild hypokalemia, cbc stable  Assessment and Plan: Principal Problem:   Acute respiratory failure with hypoxia (HCC) Active Problems:   Hypokalemia    Hypertension   Hypothyroidism   Adenocarcinoma of right lung, stage 4 (HCC)   Constipation   Normocytic anemia   Moderate protein malnutrition (HCC)   Occlusion of left pulmonary artery (HCC)   Cardiomegaly   Pleural effusion due to CHF (congestive heart failure) (HCC)   Acute respiratory distress with acute hypoxic respiratory failure Multifocal bilateral bronchopneumonia Adenocarcinoma of right lung stage IV: Imaging shows bony spinal mets pulmonary nodules and lymphadenopathy along with bilateral multifocal opacities Differentials:pneumonia, opportunistic infections, pneumonitis due to either radiation or immunotherapy. Although bp up some- TTE  shows EF 55 to 60% G1 DD normal RV systolic function mitral valve normal aortic valve tricuspid,  Cont HHFNC and wean as able PCCM input appreciated- trying steroid trial w/ solumedrol, and diuresis x 2 doses. Cont nebs,empiric antibiotic.Pro-Cal < 0.1, has DVT b/l but no PE. Sent message to Dr Arbutus Ped.Chemotherapy on hold for now.  Cardiomegaly: But TTE normal.BNP elevated some 162>264  Anemia Mld drop likley dilutional stable and up now Recent Labs  Lab 03/01/23 0927 03/02/23 0455 03/03/23 0336  HGB 10.8* 9.8* 11.4*  HCT 34.0* 32.1* 36.0   Acute bilateral LE DVT left common femoral and left proximal profunda vein on prelim report 9/22 Occluded LLL pulm artery was since July: As planned per oncology-started on Eliquis therapeutic dose   Hypertension: Bp stable, meds on hold  Hypothyroidism: Continue levothyroxine  Hypokalemia: Replete po/  Constipation: Continue stool softener  Protein malnutrition moderate: Augment diet Nutrition Status:       Goals of care: I had discussed CODE STATUS with the patient 9/22 and she wished to be DNR no CPR or intubation, patient's 2 daughters at the bedside In agreement.  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 Patient also having cough occasionally productive, mild wheezing.  She has been prescribed mirtazapine recently for low appetite. In the ED: Hypoxic 50% on room air placed on nonrebreather.  Labs showed hypokalemia stable renal function albumin 2.8 Pro-Cal less than 0.1 lactic acid 0.9 BNP 152 CBC with hemoglobin 10.8 influenza COVID-19 screen negative, UA with WBC more than 50 large leukocytes. chest x-ray>> bilateral opacities right more than left concerning for pneumonia. CTA>> occluded left lower lobe pulmonary artery branch since 7/14, stable collapse of T7 with paraspinal soft tissue density and acute kyphosis, interval change of CHF, cardiomegaly interstitial pulmonary edema minimal right pleural effusion known primary lung carcinoma 2.9 cm right lower lobe stable and large lymph nodes, stable left humeral head sclerotic metastasis or infarct, COPD. Patient was wheezing and having rales on exam.  Placed on empiric antibiotic and admitted PCCM was consulted due to worsening hypoxia, echo obtained shows EF 55 to 60% G1 DD normal RV systolic function mitral valve normal aortic valve tricuspid. 9/22 Started on a steroid.   Subjective: Seen and examined Resting comfortably mild cough No chest pain nausea or vomiting, no fever, on 55L HFNC Daughter at bedside LABS with mild hypokalemia, cbc stable  Assessment and Plan: Principal Problem:   Acute respiratory failure with hypoxia (HCC) Active Problems:   Hypokalemia    Hypertension   Hypothyroidism   Adenocarcinoma of right lung, stage 4 (HCC)   Constipation   Normocytic anemia   Moderate protein malnutrition (HCC)   Occlusion of left pulmonary artery (HCC)   Cardiomegaly   Pleural effusion due to CHF (congestive heart failure) (HCC)   Acute respiratory distress with acute hypoxic respiratory failure Multifocal bilateral bronchopneumonia Adenocarcinoma of right lung stage IV: Imaging shows bony spinal mets pulmonary nodules and lymphadenopathy along with bilateral multifocal opacities Differentials:pneumonia, opportunistic infections, pneumonitis due to either radiation or immunotherapy. Although bp up some- TTE  shows EF 55 to 60% G1 DD normal RV systolic function mitral valve normal aortic valve tricuspid,  Cont HHFNC and wean as able PCCM input appreciated- trying steroid trial w/ solumedrol, and diuresis x 2 doses. Cont nebs,empiric antibiotic.Pro-Cal < 0.1, has DVT b/l but no PE. Sent message to Dr Arbutus Ped.Chemotherapy on hold for now.  Cardiomegaly: But TTE normal.BNP elevated some 162>264  Anemia Mld drop likley dilutional stable and up now Recent Labs  Lab 03/01/23 0927 03/02/23 0455 03/03/23 0336  HGB 10.8* 9.8* 11.4*  HCT 34.0* 32.1* 36.0   Acute bilateral LE DVT left common femoral and left proximal profunda vein on prelim report 9/22 Occluded LLL pulm artery was since July: As planned per oncology-started on Eliquis therapeutic dose   Hypertension: Bp stable, meds on hold  Hypothyroidism: Continue levothyroxine  Hypokalemia: Replete po/  Constipation: Continue stool softener  Protein malnutrition moderate: Augment diet Nutrition Status:       Goals of care: I had discussed CODE STATUS with the patient 9/22 and she wished to be DNR no CPR or intubation, patient's 2 daughters at the bedside In agreement.  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 Patient also having cough occasionally productive, mild wheezing.  She has been prescribed mirtazapine recently for low appetite. In the ED: Hypoxic 50% on room air placed on nonrebreather.  Labs showed hypokalemia stable renal function albumin 2.8 Pro-Cal less than 0.1 lactic acid 0.9 BNP 152 CBC with hemoglobin 10.8 influenza COVID-19 screen negative, UA with WBC more than 50 large leukocytes. chest x-ray>> bilateral opacities right more than left concerning for pneumonia. CTA>> occluded left lower lobe pulmonary artery branch since 7/14, stable collapse of T7 with paraspinal soft tissue density and acute kyphosis, interval change of CHF, cardiomegaly interstitial pulmonary edema minimal right pleural effusion known primary lung carcinoma 2.9 cm right lower lobe stable and large lymph nodes, stable left humeral head sclerotic metastasis or infarct, COPD. Patient was wheezing and having rales on exam.  Placed on empiric antibiotic and admitted PCCM was consulted due to worsening hypoxia, echo obtained shows EF 55 to 60% G1 DD normal RV systolic function mitral valve normal aortic valve tricuspid. 9/22 Started on a steroid.   Subjective: Seen and examined Resting comfortably mild cough No chest pain nausea or vomiting, no fever, on 55L HFNC Daughter at bedside LABS with mild hypokalemia, cbc stable  Assessment and Plan: Principal Problem:   Acute respiratory failure with hypoxia (HCC) Active Problems:   Hypokalemia    Hypertension   Hypothyroidism   Adenocarcinoma of right lung, stage 4 (HCC)   Constipation   Normocytic anemia   Moderate protein malnutrition (HCC)   Occlusion of left pulmonary artery (HCC)   Cardiomegaly   Pleural effusion due to CHF (congestive heart failure) (HCC)   Acute respiratory distress with acute hypoxic respiratory failure Multifocal bilateral bronchopneumonia Adenocarcinoma of right lung stage IV: Imaging shows bony spinal mets pulmonary nodules and lymphadenopathy along with bilateral multifocal opacities Differentials:pneumonia, opportunistic infections, pneumonitis due to either radiation or immunotherapy. Although bp up some- TTE  shows EF 55 to 60% G1 DD normal RV systolic function mitral valve normal aortic valve tricuspid,  Cont HHFNC and wean as able PCCM input appreciated- trying steroid trial w/ solumedrol, and diuresis x 2 doses. Cont nebs,empiric antibiotic.Pro-Cal < 0.1, has DVT b/l but no PE. Sent message to Dr Arbutus Ped.Chemotherapy on hold for now.  Cardiomegaly: But TTE normal.BNP elevated some 162>264  Anemia Mld drop likley dilutional stable and up now Recent Labs  Lab 03/01/23 0927 03/02/23 0455 03/03/23 0336  HGB 10.8* 9.8* 11.4*  HCT 34.0* 32.1* 36.0   Acute bilateral LE DVT left common femoral and left proximal profunda vein on prelim report 9/22 Occluded LLL pulm artery was since July: As planned per oncology-started on Eliquis therapeutic dose   Hypertension: Bp stable, meds on hold  Hypothyroidism: Continue levothyroxine  Hypokalemia: Replete po/  Constipation: Continue stool softener  Protein malnutrition moderate: Augment diet Nutrition Status:       Goals of care: I had discussed CODE STATUS with the patient 9/22 and she wished to be DNR no CPR or intubation, patient's 2 daughters at the bedside In agreement.  DVT prophylaxis: Eliquis Code Status:   Code Status: Limited:  PTV      Full                                                        +---------+---------------+---------+-----------+----------+--------------+ PERO     Full                                                        +---------+---------------+---------+-----------+----------+--------------+ EIV                     Yes      Yes                                 +---------+---------------+---------+-----------+----------+--------------+     Summary: RIGHT: - Findings consistent with acute deep vein thrombosis involving the right common femoral vein, right femoral vein, and right popliteal vein.  - No cystic structure found in the popliteal fossa. - The external iliac vein appears patent.  LEFT: - Findings consistent with acute deep vein thrombosis involving the left common femoral vein, and left proximal profunda vein.  - No cystic structure found in the popliteal fossa. - The external iliac vein appears patent.  *See table(s) above for measurements and observations. Electronically signed by Coral Else MD on 03/02/2023 at 11:31:49 AM.    Final    ECHOCARDIOGRAM COMPLETE  Result Date: 03/02/2023    ECHOCARDIOGRAM REPORT   Patient Name:   YULIZA PISCIOTTA Date of Exam: 03/02/2023 Medical Rec #:  [id number]       Height:        63.0 in Accession #:    [id number]      Weight:       147.3 lb Date of Birth:  [birth year]      BSA:          1.698 m Patient Age:    72 years        BP:           111/61 mmHg Patient Gender: F               HR:           93 bpm. Exam Location:  Inpatient Procedure: 2D Echo, Color Doppler and Cardiac Doppler Indications:    Cardiomegaly I51.7  History:        Patient has no prior history of Echocardiogram examinations.                 Risk Factors:Hypertension.  Sonographer:    Harriette Bouillon RDCS Referring Phys: [id number] DAVID MANUEL ORTIZ IMPRESSIONS  1. Left ventricular ejection fraction, by estimation, is 55 to 60%. The left ventricle has normal function. The left ventricle has no regional wall motion abnormalities. Left ventricular diastolic parameters are consistent with Grade I diastolic dysfunction (impaired relaxation).  2. Right ventricular systolic function is normal. The right ventricular size is normal. There is normal pulmonary artery systolic pressure.  3. Left atrial size was mildly dilated.  4. The mitral valve is normal in structure. Mild mitral valve regurgitation. No evidence of mitral stenosis.  5. The aortic valve is tricuspid.  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 Patient also having cough occasionally productive, mild wheezing.  She has been prescribed mirtazapine recently for low appetite. In the ED: Hypoxic 50% on room air placed on nonrebreather.  Labs showed hypokalemia stable renal function albumin 2.8 Pro-Cal less than 0.1 lactic acid 0.9 BNP 152 CBC with hemoglobin 10.8 influenza COVID-19 screen negative, UA with WBC more than 50 large leukocytes. chest x-ray>> bilateral opacities right more than left concerning for pneumonia. CTA>> occluded left lower lobe pulmonary artery branch since 7/14, stable collapse of T7 with paraspinal soft tissue density and acute kyphosis, interval change of CHF, cardiomegaly interstitial pulmonary edema minimal right pleural effusion known primary lung carcinoma 2.9 cm right lower lobe stable and large lymph nodes, stable left humeral head sclerotic metastasis or infarct, COPD. Patient was wheezing and having rales on exam.  Placed on empiric antibiotic and admitted PCCM was consulted due to worsening hypoxia, echo obtained shows EF 55 to 60% G1 DD normal RV systolic function mitral valve normal aortic valve tricuspid. 9/22 Started on a steroid.   Subjective: Seen and examined Resting comfortably mild cough No chest pain nausea or vomiting, no fever, on 55L HFNC Daughter at bedside LABS with mild hypokalemia, cbc stable  Assessment and Plan: Principal Problem:   Acute respiratory failure with hypoxia (HCC) Active Problems:   Hypokalemia    Hypertension   Hypothyroidism   Adenocarcinoma of right lung, stage 4 (HCC)   Constipation   Normocytic anemia   Moderate protein malnutrition (HCC)   Occlusion of left pulmonary artery (HCC)   Cardiomegaly   Pleural effusion due to CHF (congestive heart failure) (HCC)   Acute respiratory distress with acute hypoxic respiratory failure Multifocal bilateral bronchopneumonia Adenocarcinoma of right lung stage IV: Imaging shows bony spinal mets pulmonary nodules and lymphadenopathy along with bilateral multifocal opacities Differentials:pneumonia, opportunistic infections, pneumonitis due to either radiation or immunotherapy. Although bp up some- TTE  shows EF 55 to 60% G1 DD normal RV systolic function mitral valve normal aortic valve tricuspid,  Cont HHFNC and wean as able PCCM input appreciated- trying steroid trial w/ solumedrol, and diuresis x 2 doses. Cont nebs,empiric antibiotic.Pro-Cal < 0.1, has DVT b/l but no PE. Sent message to Dr Arbutus Ped.Chemotherapy on hold for now.  Cardiomegaly: But TTE normal.BNP elevated some 162>264  Anemia Mld drop likley dilutional stable and up now Recent Labs  Lab 03/01/23 0927 03/02/23 0455 03/03/23 0336  HGB 10.8* 9.8* 11.4*  HCT 34.0* 32.1* 36.0   Acute bilateral LE DVT left common femoral and left proximal profunda vein on prelim report 9/22 Occluded LLL pulm artery was since July: As planned per oncology-started on Eliquis therapeutic dose   Hypertension: Bp stable, meds on hold  Hypothyroidism: Continue levothyroxine  Hypokalemia: Replete po/  Constipation: Continue stool softener  Protein malnutrition moderate: Augment diet Nutrition Status:       Goals of care: I had discussed CODE STATUS with the patient 9/22 and she wished to be DNR no CPR or intubation, patient's 2 daughters at the bedside In agreement.  DVT prophylaxis: Eliquis Code Status:   Code Status: Limited:  PTV      Full                                                        +---------+---------------+---------+-----------+----------+--------------+ PERO     Full                                                        +---------+---------------+---------+-----------+----------+--------------+ EIV                     Yes      Yes                                 +---------+---------------+---------+-----------+----------+--------------+     Summary: RIGHT: - Findings consistent with acute deep vein thrombosis involving the right common femoral vein, right femoral vein, and right popliteal vein.  - No cystic structure found in the popliteal fossa. - The external iliac vein appears patent.  LEFT: - Findings consistent with acute deep vein thrombosis involving the left common femoral vein, and left proximal profunda vein.  - No cystic structure found in the popliteal fossa. - The external iliac vein appears patent.  *See table(s) above for measurements and observations. Electronically signed by Coral Else MD on 03/02/2023 at 11:31:49 AM.    Final    ECHOCARDIOGRAM COMPLETE  Result Date: 03/02/2023    ECHOCARDIOGRAM REPORT   Patient Name:   YULIZA PISCIOTTA Date of Exam: 03/02/2023 Medical Rec #:  [id number]       Height:        63.0 in Accession #:    [id number]      Weight:       147.3 lb Date of Birth:  [birth year]      BSA:          1.698 m Patient Age:    72 years        BP:           111/61 mmHg Patient Gender: F               HR:           93 bpm. Exam Location:  Inpatient Procedure: 2D Echo, Color Doppler and Cardiac Doppler Indications:    Cardiomegaly I51.7  History:        Patient has no prior history of Echocardiogram examinations.                 Risk Factors:Hypertension.  Sonographer:    Harriette Bouillon RDCS Referring Phys: [id number] DAVID MANUEL ORTIZ IMPRESSIONS  1. Left ventricular ejection fraction, by estimation, is 55 to 60%. The left ventricle has normal function. The left ventricle has no regional wall motion abnormalities. Left ventricular diastolic parameters are consistent with Grade I diastolic dysfunction (impaired relaxation).  2. Right ventricular systolic function is normal. The right ventricular size is normal. There is normal pulmonary artery systolic pressure.  3. Left atrial size was mildly dilated.  4. The mitral valve is normal in structure. Mild mitral valve regurgitation. No evidence of mitral stenosis.  5. The aortic valve is tricuspid.  PTV      Full                                                        +---------+---------------+---------+-----------+----------+--------------+ PERO     Full                                                        +---------+---------------+---------+-----------+----------+--------------+ EIV                     Yes      Yes                                 +---------+---------------+---------+-----------+----------+--------------+     Summary: RIGHT: - Findings consistent with acute deep vein thrombosis involving the right common femoral vein, right femoral vein, and right popliteal vein.  - No cystic structure found in the popliteal fossa. - The external iliac vein appears patent.  LEFT: - Findings consistent with acute deep vein thrombosis involving the left common femoral vein, and left proximal profunda vein.  - No cystic structure found in the popliteal fossa. - The external iliac vein appears patent.  *See table(s) above for measurements and observations. Electronically signed by Coral Else MD on 03/02/2023 at 11:31:49 AM.    Final    ECHOCARDIOGRAM COMPLETE  Result Date: 03/02/2023    ECHOCARDIOGRAM REPORT   Patient Name:   YULIZA PISCIOTTA Date of Exam: 03/02/2023 Medical Rec #:  [id number]       Height:        63.0 in Accession #:    [id number]      Weight:       147.3 lb Date of Birth:  [birth year]      BSA:          1.698 m Patient Age:    72 years        BP:           111/61 mmHg Patient Gender: F               HR:           93 bpm. Exam Location:  Inpatient Procedure: 2D Echo, Color Doppler and Cardiac Doppler Indications:    Cardiomegaly I51.7  History:        Patient has no prior history of Echocardiogram examinations.                 Risk Factors:Hypertension.  Sonographer:    Harriette Bouillon RDCS Referring Phys: [id number] DAVID MANUEL ORTIZ IMPRESSIONS  1. Left ventricular ejection fraction, by estimation, is 55 to 60%. The left ventricle has normal function. The left ventricle has no regional wall motion abnormalities. Left ventricular diastolic parameters are consistent with Grade I diastolic dysfunction (impaired relaxation).  2. Right ventricular systolic function is normal. The right ventricular size is normal. There is normal pulmonary artery systolic pressure.  3. Left atrial size was mildly dilated.  4. The mitral valve is normal in structure. Mild mitral valve regurgitation. No evidence of mitral stenosis.  5. The aortic valve is tricuspid.  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 Patient also having cough occasionally productive, mild wheezing.  She has been prescribed mirtazapine recently for low appetite. In the ED: Hypoxic 50% on room air placed on nonrebreather.  Labs showed hypokalemia stable renal function albumin 2.8 Pro-Cal less than 0.1 lactic acid 0.9 BNP 152 CBC with hemoglobin 10.8 influenza COVID-19 screen negative, UA with WBC more than 50 large leukocytes. chest x-ray>> bilateral opacities right more than left concerning for pneumonia. CTA>> occluded left lower lobe pulmonary artery branch since 7/14, stable collapse of T7 with paraspinal soft tissue density and acute kyphosis, interval change of CHF, cardiomegaly interstitial pulmonary edema minimal right pleural effusion known primary lung carcinoma 2.9 cm right lower lobe stable and large lymph nodes, stable left humeral head sclerotic metastasis or infarct, COPD. Patient was wheezing and having rales on exam.  Placed on empiric antibiotic and admitted PCCM was consulted due to worsening hypoxia, echo obtained shows EF 55 to 60% G1 DD normal RV systolic function mitral valve normal aortic valve tricuspid. 9/22 Started on a steroid.   Subjective: Seen and examined Resting comfortably mild cough No chest pain nausea or vomiting, no fever, on 55L HFNC Daughter at bedside LABS with mild hypokalemia, cbc stable  Assessment and Plan: Principal Problem:   Acute respiratory failure with hypoxia (HCC) Active Problems:   Hypokalemia    Hypertension   Hypothyroidism   Adenocarcinoma of right lung, stage 4 (HCC)   Constipation   Normocytic anemia   Moderate protein malnutrition (HCC)   Occlusion of left pulmonary artery (HCC)   Cardiomegaly   Pleural effusion due to CHF (congestive heart failure) (HCC)   Acute respiratory distress with acute hypoxic respiratory failure Multifocal bilateral bronchopneumonia Adenocarcinoma of right lung stage IV: Imaging shows bony spinal mets pulmonary nodules and lymphadenopathy along with bilateral multifocal opacities Differentials:pneumonia, opportunistic infections, pneumonitis due to either radiation or immunotherapy. Although bp up some- TTE  shows EF 55 to 60% G1 DD normal RV systolic function mitral valve normal aortic valve tricuspid,  Cont HHFNC and wean as able PCCM input appreciated- trying steroid trial w/ solumedrol, and diuresis x 2 doses. Cont nebs,empiric antibiotic.Pro-Cal < 0.1, has DVT b/l but no PE. Sent message to Dr Arbutus Ped.Chemotherapy on hold for now.  Cardiomegaly: But TTE normal.BNP elevated some 162>264  Anemia Mld drop likley dilutional stable and up now Recent Labs  Lab 03/01/23 0927 03/02/23 0455 03/03/23 0336  HGB 10.8* 9.8* 11.4*  HCT 34.0* 32.1* 36.0   Acute bilateral LE DVT left common femoral and left proximal profunda vein on prelim report 9/22 Occluded LLL pulm artery was since July: As planned per oncology-started on Eliquis therapeutic dose   Hypertension: Bp stable, meds on hold  Hypothyroidism: Continue levothyroxine  Hypokalemia: Replete po/  Constipation: Continue stool softener  Protein malnutrition moderate: Augment diet Nutrition Status:       Goals of care: I had discussed CODE STATUS with the patient 9/22 and she wished to be DNR no CPR or intubation, patient's 2 daughters at the bedside In agreement.  DVT prophylaxis: Eliquis Code Status:   Code Status: Limited:

## 2023-03-04 ENCOUNTER — Ambulatory Visit: Payer: PPO

## 2023-03-04 DIAGNOSIS — J9601 Acute respiratory failure with hypoxia: Secondary | ICD-10-CM | POA: Diagnosis not present

## 2023-03-04 DIAGNOSIS — J984 Other disorders of lung: Secondary | ICD-10-CM

## 2023-03-04 LAB — BASIC METABOLIC PANEL
Anion gap: 10 (ref 5–15)
BUN: 19 mg/dL (ref 8–23)
CO2: 26 mmol/L (ref 22–32)
Calcium: 8.8 mg/dL — ABNORMAL LOW (ref 8.9–10.3)
Chloride: 102 mmol/L (ref 98–111)
Creatinine, Ser: 0.48 mg/dL (ref 0.44–1.00)
GFR, Estimated: >60 mL/min (ref >60)
Glucose, Bld: 158 mg/dL — ABNORMAL HIGH (ref 70–99)
Potassium: 4.2 mmol/L (ref 3.5–5.1)
Sodium: 138 mmol/L (ref 135–145)

## 2023-03-04 LAB — CBC
HCT: 38.7 % (ref 36.0–46.0)
Hemoglobin: 12.3 g/dL (ref 12.0–15.0)
MCH: 30.6 pg (ref 26.0–34.0)
MCHC: 31.8 g/dL (ref 30.0–36.0)
MCV: 96.3 fL (ref 80.0–100.0)
Platelets: 178 10*3/uL (ref 150–400)
RBC: 4.02 MIL/uL (ref 3.87–5.11)
RDW: 14.9 % (ref 11.5–15.5)
WBC: 8.2 10*3/uL (ref 4.0–10.5)
nRBC: 0 % (ref 0.0–0.2)

## 2023-03-04 MED ORDER — ENSURE ENLIVE PO LIQD
237.0000 mL | Freq: Two times a day (BID) | ORAL | Status: DC
  Administered 2023-03-04 – 2023-03-23 (×28): 237 mL via ORAL

## 2023-03-04 NOTE — Progress Notes (Signed)
   03/04/23 1152  Oxygen Therapy/Pulse Ox  O2 Device HHFNC  O2 Therapy Oxygen humidified  Heater temperature 87.8 F (31 C)  O2 Flow Rate (L/min) 35 L/min  FiO2 (%) (S)  45 % (Weaned to 45%.)  SpO2 96 %  Safety Instructions Yes (Comment)

## 2023-03-04 NOTE — Plan of Care (Signed)

## 2023-03-04 NOTE — Care Management (Signed)
Transition of Care [facility]) - Inpatient Brief Assessment   Patient Details  Name: Hayley Wu MRN: [id number] Date of Birth: 1950/07/07  Transition of [facility]) CM/SW Contact:    Lavenia Atlas, RN Phone Number: 03/04/2023, 6:26 PM   Clinical Narrative: Per chart review patient from home with family. Patient currently in [facility] ICU for acute respiratory failure w/hypoxia. Patient active with chemo (lung cancer), on HFNC. No current TOC needs.  TOC will continue to follow   Transition of Care Asessment: Insurance and Status: Insurance coverage has been reviewed Patient has primary care physician: Yes Home environment has been reviewed: from home with children Prior level of function:: independent Prior/Current Home Services: No current home services Social Determinants of Health Reivew: SDOH reviewed no interventions necessary Readmission risk has been reviewed: Yes Transition of care needs: no transition of care needs at this time

## 2023-03-04 NOTE — Progress Notes (Signed)
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 Patient also having cough occasionally productive, mild wheezing.  She has been prescribed mirtazapine recently for low appetite. In the ED: Hypoxic 50% on room air placed on nonrebreather.  Labs showed hypokalemia stable renal function albumin 2.8 Pro-Cal less than 0.1 lactic acid 0.9 BNP 152 CBC with hemoglobin 10.8 influenza COVID-19 screen negative, UA with WBC more than 50 large leukocytes. chest x-ray>> bilateral opacities right more than left concerning for pneumonia. CTA>> occluded left lower lobe pulmonary artery branch since 7/14, stable collapse of T7 with paraspinal soft tissue density and acute kyphosis, interval change of CHF, cardiomegaly interstitial pulmonary edema minimal right pleural effusion known primary lung carcinoma 2.9 cm right lower lobe stable and large lymph nodes, stable left humeral head sclerotic metastasis or infarct, COPD. Patient was wheezing and having rales on exam.  Placed on empiric antibiotic and admitted PCCM was consulted due to worsening hypoxia, echo obtained shows EF 55 to 60% G1 DD normal RV systolic function mitral valve normal aortic valve tricuspid. 9/22 Started on a steroid trial and will continue with empiric antibiotics.  Echocardiogram normal LV RV function.   Subjective: Patient seen and examined this morning She is alert awake Denies any complaint Improving, decrease dyspnea, oxygen weaned down to 35 L/min 55%, afebrile BP stable Labs showed stable renal function and  CBC  Assessment and Plan: Principal Problem:   Acute respiratory failure with hypoxia (HCC) Active Problems:   Hypokalemia   Hypertension   Hypothyroidism   Adenocarcinoma of right lung, stage 4 (HCC)   Constipation   Normocytic anemia   Moderate protein malnutrition (HCC)   Occlusion of left pulmonary artery (HCC)   Cardiomegaly   Pleural effusion due to CHF (congestive heart failure) (HCC)   Acute respiratory distress with acute hypoxic respiratory failure Multifocal bilateral bronchopneumonia/pulmonary infiltrates Adenocarcinoma of right lung stage IV: Imaging shows bony spinal mets pulmonary nodules and lymphadenopathy along with bilateral multifocal opacities Differentials:pneumonia, opportunistic infections, pneumonitis due to either radiation or immunotherapy. BNP was up however echo with normal LV RV function got few doses of IV Lasix initially. PCCM managing appreciate input doing high-dose steroid trial with 125 mg x 3 days then to prednisone 60 mg daily Continue supplemental oxygen bronchodilators, anticoagulant Eliquis.  Since procalcitonin negative-antibiotic  stopped 9/24 Dr Arbutus Ped aware of admission and agree w/ above management> per PCCM depending upon workup may need to consider change from her current immunotherapy.  Cardiomegaly: But TTE normal.BNP elevated some 162>264  Anemia Mld drop likley dilutional stable and up now Recent Labs  Lab 03/01/23 0927 03/02/23 0455 03/03/23 0336 03/04/23 0500  HGB 10.8* 9.8* 11.4* 12.3  HCT 34.0* 32.1* 36.0 38.7   Acute bilateral LE DVT left common femoral and left proximal profunda vein Newly identified subacute left pulmonary embolism: Ct Occluded LLL pulm artery was since July.As planned per oncology-started on Eliquis therapeutic dose   Hypertension: BP well-controlled, meds on hold  Hypothyroidism: Continue levothyroxine  Hypokalemia: Resolved  Constipation: Continue stool softener, last BM  9/19  Protein malnutrition moderate: Augment diet, continue Remeron at bedtime Nutrition Status:  Goals of care: I had discussed CODE STATUS with the patient 9/22 and she wished to be DNR no CPR or intubation, patient's 2 daughters at the bedside In agreement.  DVT prophylaxis: Eliquis Code Status:   Code Status: Limited: Do not attempt resuscitation (DNR) -DNR-LIMITED -Do Not Intubate/DNI  Family Communication: plan of care discussed with patient at bedside. Patient status is: Inpatient because of respiratory failure Level of care: Stepdown   Dispo: The patient is from: Home            Anticipated disposition: tbd Objective: Vitals last 24 hrs: Vitals:   03/04/23 0800 03/04/23 0900 03/04/23 1020 03/04/23 1100  BP: (!) 146/77 127/80 123/68 113/66  Pulse: 89 93 86 83  Resp: (!) 24 (!) 24 17 15   Temp: 98 F (36.7 C)     TempSrc: Axillary     SpO2: 94% 94% 95% 96%  Weight:      Height:       Weight change:   Physical Examination: General exam: alert awake, oriented at baseline, older than stated age HEENT:Oral mucosa moist, Ear/Nose WNL grossly Respiratory system: Bilaterally scattered crackles posteriorly, on HHFNC Cardiovascular system: S1 & S2 +, No JVD. Gastrointestinal system: Abdomen soft,NT,ND, BS+ Nervous System: Alert, awake, moving all extremities,and following commands. Extremities: LE edema neg,distal peripheral pulses palpable and warm.  Skin: No rashes,no icterus. MSK: Normal muscle bulk,tone, power Medications reviewed:  Scheduled Meds:  apixaban  10 mg Oral BID   Followed by   Melene Muller ON 03/08/2023] apixaban  5 mg Oral BID   Chlorhexidine Gluconate Cloth  6 each Topical QHS   folic acid  1 mg Oral Daily   levothyroxine  100 mcg Oral Q0600   methylPREDNISolone (SOLU-MEDROL) injection  125 mg Intravenous Q12H   mirtazapine  30 mg Oral QHS   senna  1 tablet Oral Daily   Continuous Infusions:      Diet Order             Diet regular Room  service appropriate? Yes; Fluid consistency: Thin  Diet effective now                  Intake/Output Summary (Last 24 hours) at 03/04/2023 1108 Last data filed at 03/04/2023 0944 Gross per 24 hour  Intake 438.28 ml  Output 600 ml  Net -161.72 ml   Net IO Since Admission: -564.64 mL [03/04/23 1108]  Wt Readings from Last 3 Encounters:  03/01/23 66.8 kg  02/11/23 67.1 kg  01/20/23 68.9 kg     Unresulted Labs (From admission, onward)     Start     Ordered   03/03/23 0500  Basic metabolic panel  Daily,   R     Question:  Specimen collection method  Answer:  Unit=Unit collect   03/02/23 0740   03/03/23 0500  CBC  Daily,   R     Question:  Specimen collection method  Answer:  Unit=Unit collect   03/02/23 0740   03/01/23 1035  Expectorated Sputum Assessment w Gram Stain, Rflx to Resp Cult  Once,   R        03/01/23 1035          Data Reviewed: I have personally reviewed following labs and imaging studies CBC: Recent Labs  Lab 03/01/23 0927 03/02/23 0455 03/03/23 0336 03/04/23 0500  WBC 7.5 6.0 4.6 8.2  NEUTROABS 6.3  --   --   --   HGB 10.8* 9.8* 11.4* 12.3  HCT 34.0*  32.1* 36.0 38.7  MCV 95.8 98.5 96.5 96.3  PLT 234 176 185 178   Basic Metabolic Panel: Recent Labs  Lab 03/01/23 0927 03/02/23 0455 03/03/23 0336 03/04/23 0500  NA 137 142 140 138  K 3.0* 3.8 3.3* 4.2  CL 102 107 102 102  CO2 24 26 29 26   GLUCOSE 101* 98 162* 158*  BUN 23 16 17 19   CREATININE 0.52 0.37* 0.46 0.48  CALCIUM 8.1* 8.4* 8.5* 8.8*  MG 2.2  --   --   --    GFR: Estimated Creatinine Clearance: 60.1 mL/min (by C-G formula based on SCr of 0.48 mg/dL). Liver Function Tests: Recent Labs  Lab 03/01/23 0927 03/02/23 0455  AST 20 17  ALT 18 13  ALKPHOS 79 59  BILITOT 1.0 1.0  PROT 6.0* 5.8*  ALBUMIN 2.8* 3.3*   No results for input(s): "LIPASE", "AMYLASE" in the last 168 hours. No results for input(s): "AMMONIA" in the last 168 hours. Coagulation Profile: Recent Labs  Lab  03/01/23 0927  INR 1.2  CBG: Recent Labs  Lab 03/02/23 0756  GLUCAP 94  Sepsis Labs: Recent Labs  Lab 03/01/23 0927 03/01/23 0942 03/02/23 0455  PROCALCITON <0.10  --  <0.10  LATICACIDVEN  --  0.9  --     Recent Results (from the past 240 hour(s))  Resp panel by RT-PCR (RSV, Flu A&B, Covid) Anterior Nasal Swab     Status: None   Collection Time: 03/01/23  9:27 AM   Specimen: Anterior Nasal Swab  Result Value Ref Range Status   SARS Coronavirus 2 by RT PCR NEGATIVE NEGATIVE Final    Comment: (NOTE) SARS-CoV-2 target nucleic acids are NOT DETECTED.  The SARS-CoV-2 RNA is generally detectable in upper respiratory specimens during the acute phase of infection. The lowest concentration of SARS-CoV-2 viral copies this assay can detect is 138 copies/mL. A negative result does not preclude SARS-Cov-2 infection and should not be used as the sole basis for treatment or other patient management decisions. A negative result may occur with  improper specimen collection/handling, submission of specimen other than nasopharyngeal swab, presence of viral mutation(s) within the areas targeted by this assay, and inadequate number of viral copies(<138 copies/mL). A negative result must be combined with clinical observations, patient history, and epidemiological information. The expected result is Negative.  Fact Sheet for Patients:  BloggerCourse.com  Fact Sheet for Healthcare Providers:  SeriousBroker.it  This test is no t yet approved or cleared by the [place] FDA and  has been authorized for detection and/or diagnosis of SARS-CoV-2 by FDA under an Emergency Use Authorization (EUA). This EUA will remain  in effect (meaning this test can be used) for the duration of the COVID-19 declaration under Section 564(b)(1) of the Act, 21 U.S.C.section 360bbb-3(b)(1), unless the authorization is terminated  or revoked sooner.        Influenza A by PCR NEGATIVE NEGATIVE Final   Influenza B by PCR NEGATIVE NEGATIVE Final    Comment: (NOTE) The Xpert Xpress SARS-CoV-2/FLU/RSV plus assay is intended as an aid in the diagnosis of influenza from Nasopharyngeal swab specimens and should not be used as a sole basis for treatment. Nasal washings and aspirates are unacceptable for Xpert Xpress SARS-CoV-2/FLU/RSV testing.  Fact Sheet for Patients: BloggerCourse.com  Fact Sheet for Healthcare Providers: SeriousBroker.it  This test is not yet approved or cleared by the [place] FDA and has been authorized for detection and/or diagnosis of SARS-CoV-2 by FDA under an Emergency Use Authorization (  EUA). This EUA will remain in effect (meaning this test can be used) for the duration of the COVID-19 declaration under Section 564(b)(1) of the Act, 21 U.S.C. section 360bbb-3(b)(1), unless the authorization is terminated or revoked.     Resp Syncytial Virus by PCR NEGATIVE NEGATIVE Final    Comment: (NOTE) Fact Sheet for Patients: BloggerCourse.com  Fact Sheet for Healthcare Providers: SeriousBroker.it  This test is not yet approved or cleared by the [place] FDA and has been authorized for detection and/or diagnosis of SARS-CoV-2 by FDA under an Emergency Use Authorization (EUA). This EUA will remain in effect (meaning this test can be used) for the duration of the COVID-19 declaration under Section 564(b)(1) of the Act, 21 U.S.C. section 360bbb-3(b)(1), unless the authorization is terminated or revoked.  Performed at [facility], [address] [place], [place] [address]   Blood Culture (routine x 2)     Status: None (Preliminary result)   Collection Time: 03/01/23  9:27 AM   Specimen: BLOOD RIGHT ARM  Result Value Ref Range Status   Specimen Description   Final    BLOOD RIGHT  ARM Performed at [facility], [address], [place], [place] [address]    Special Requests   Final    BOTTLES DRAWN AEROBIC AND ANAEROBIC Blood Culture adequate volume Performed at [facility], [address] [place], [place] [address]    Culture   Final    NO GROWTH 3 DAYS Performed at [facility], [address], [place], [place] [address]    Report Status PENDING  Incomplete  Blood Culture (routine x 2)     Status: None (Preliminary result)   Collection Time: 03/01/23  9:49 AM   Specimen: BLOOD  Result Value Ref Range Status   Specimen Description   Final    BLOOD LEFT ANTECUBITAL Performed at [facility], [address] [place], [place] [address]    Special Requests   Final    BOTTLES DRAWN AEROBIC AND ANAEROBIC Blood Culture adequate volume Performed at [facility], [address] [place], [place] [address]    Culture   Final    NO GROWTH 3 DAYS Performed at [facility], [address], [place], [place] [address]    Report Status PENDING  Incomplete  Urine Culture (for pregnant, neutropenic or urologic patients or patients with an indwelling urinary catheter)     Status: Abnormal (Preliminary result)   Collection Time: 03/01/23 11:58 AM   Specimen: Urine, Clean Catch  Result Value Ref Range Status   Specimen Description   Final    URINE, CLEAN CATCH Performed at [facility], [address], [place], [place] [address]    Special Requests   Final    NONE Performed at [facility], [address] [place], [place] [address]    Culture 60,000 COLONIES/mL ESCHERICHIA COLI (A)  Final   Report Status PENDING  Incomplete  MRSA Next Gen by PCR, Nasal     Status: None   Collection Time: 03/01/23  9:50 PM   Specimen: Nasal Mucosa; Nasal Swab  Result Value Ref Range Status   MRSA by PCR Next Gen NOT DETECTED NOT DETECTED Final    Comment: (NOTE) The  GeneXpert MRSA Assay (FDA approved for NASAL specimens only), is one component of a comprehensive MRSA colonization surveillance program. It is not intended to diagnose MRSA infection nor to guide or  monitor treatment for MRSA infections. Test performance is not FDA approved in patients less than 70 years old. Performed at [facility], [address], [place], [place] [address]     Antimicrobials: Anti-infectives (From admission, onward)    Start     Dose/Rate Route Frequency Ordered Stop   03/01/23 1015  azithromycin (ZITHROMAX) 500 mg in sodium chloride 0.9 % 250 mL IVPB  Status:  Discontinued        500 mg 250 mL/hr over 60 Minutes Intravenous Every 24 hours 03/01/23 1009 03/04/23 0856   03/01/23 1015  cefTRIAXone (ROCEPHIN) 1 g in sodium chloride 0.9 % 100 mL IVPB  Status:  Discontinued        1 g 200 mL/hr over 30 Minutes Intravenous Every 24 hours 03/01/23 1009 03/04/23 0856      Culture/Microbiology    Component Value Date/Time   SDES  03/01/2023 1158    URINE, CLEAN CATCH Performed at [facility], [address], [place], [place] [address]    SPECREQUEST  03/01/2023 1158    NONE Performed at [facility], [address], [place], [place] [address]    CULT 60,000 COLONIES/mL ESCHERICHIA COLI (A) 03/01/2023 1158   REPTSTATUS PENDING 03/01/2023 1158    Radiology Studies: No results found.   LOS: 3 days   Lanae Boast, MD [facility]  03/04/2023, 11:08 AM

## 2023-03-04 NOTE — Progress Notes (Signed)
Initial Nutrition Assessment  DOCUMENTATION CODES:   Non-severe (moderate) malnutrition in context of chronic illness  INTERVENTION:  - Regular diet.  - Ensure Plus High Protein po BID, each supplement provides 350 kcal and 20 grams of protein. - Encourage intake at all meals and of supplements.  - Monitor weight trends.  NUTRITION DIAGNOSIS:   Moderate Malnutrition related to chronic illness (stage IV adenocarcinoma of lung) as evidenced by mild fat depletion, mild muscle depletion.  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 Admitted 9/21 with dyspnea, hypoxemia, lethargy and low-grade fever.   Patient endorses UBW of 200# she last weighed in December and steady weight loss since that time due to "cancer".  Per EMR, only weight history within the past year begins in April, when patient was weighed at 159#. Initial significant loss from the middle to end of May. However, weight without significant changes since that time.   Patient reports she typically only eats 2 meals a day at baseline. States she was living with her daughter all summer and during that time was eating well with 3 full meals a day. Over the past 1 month (since middle of August), she admits she has been living back at home and has been eating poorly. Appetite is decreased. Occasionally drinking a Premier Protein or Fairlife shake but not every day.   Thankfully, patient reports her current appetite is somewhat improved due to being on a steroid. She is documented to have had 100% of both breakfast and lunch today. She is  agreeable to try Ensure during admission to support intake.     Medications reviewed and include: 1mg  folic acid, Remeron, Senokot, Solu-medrol  Labs reviewed:  -   NUTRITION - FOCUSED PHYSICAL EXAM:  Flowsheet Row Most Recent Value  Orbital Region No depletion  Upper Arm Region Mild depletion  Thoracic and Lumbar Region Mild depletion  Buccal Region Mild depletion  Temple Region Moderate depletion  Clavicle Bone Region Mild depletion  Clavicle and Acromion Bone Region Mild depletion  Scapular Bone Region Unable to assess  Dorsal Hand Mild depletion  Patellar Region No depletion  Anterior Thigh Region No depletion  Posterior Calf Region No depletion  Edema (RD Assessment) None  Hair Reviewed  Eyes Reviewed  Mouth Reviewed  Skin Reviewed  Nails Reviewed       Diet Order:   Diet Order             Diet regular Room service appropriate? Yes; Fluid consistency: Thin  Diet effective now                   EDUCATION NEEDS:  Education needs have been addressed  Skin:  Skin Assessment: Reviewed RN Assessment  Last BM:  9/19  Height:  Ht Readings from Last 1 Encounters:  03/01/23 5\' 3"  (1.6 m)   Weight:  Wt Readings from Last 1 Encounters:  03/01/23 66.8 kg    BMI:  Body mass index is 26.09 kg/m.  Estimated Nutritional Needs:  Kcal:  1900-2150 kcals Protein:  80-100 grams Fluid:  >/= 1.9L    Shelle Iron RD, LDN For  contact information, refer to [facility].

## 2023-03-04 NOTE — Progress Notes (Signed)
NAME:  Hayley Wu, MRN:  [id number], DOB:  [birth year], LOS: 3 ADMISSION DATE:  03/01/2023, CONSULTATION DATE: 03/02/2023 REFERRING MD: Dr. Jonathon Bellows, Reason for consultation: Hypoxemia  History of Present Illness:  72 year old woman with a history of stage IV adenocarcinoma lung that presented with bilateral pulmonary nodules, right hilar and mediastinal adenopathy, hepatic and bony metastases (09/2022) required decompressive T7 laminectomy due to spinal cord compression and epidural mass.  Received palliative radiation T7, completed 10/25/2022 as well as chemotherapy, maintenance immunotherapy with Alimta and Keytruda (Dr. Arbutus Ped, last seen 02/11/2023). She had a surveillance CT CAP on 9/19 that showed suspected bilateral femoral filling defects, a newly identified subacute left pulmonary embolism.  Eliquis was started on 9/20.  She was admitted 9/21 with dyspnea, hypoxemia, lethargy and low-grade fever, minimally productive cough.  No clear sick exposures.  COVID-19, flu and RSV were all negative.  Pertinent  Medical History   Past Medical History:  Diagnosis Date   Allergy    Contact lens/glasses fitting    Hypertension   Stage IV adenocarcinoma lung with hepatic and bony metastases Hypertension Hypothyroidism  Significant Hospital Events: Including procedures, antibiotic start and stop dates in addition to other pertinent events   9/19 CT chest, abdomen, pelvis >> stable spiculated right lower lobe and right upper lobe nodules (smaller than original diagnosis), decrease right hepatic lesion, unchanged sclerotic osseous metastatic disease, new groundglass opacities medial bilateral lower lobes with some associated consolidation.  Question bilateral common femoral filling defects concerning for DVT 9/21 CT-PA >> left lower lobe PA pulmonary embolism, in retrospect may have been present as far back as 12/22/2022.  Diffuse increase in bilateral interstitial infiltrates.  Other findings  stable 9/22 echocardiogram >> normal LV and RV function  Interim History / Subjective:  Improving but remains on heated high flow nasal cannula Down to 50% / 35 L Feels better, decreased dyspnea Afebrile  Objective   Blood pressure (!) 146/77, pulse 89, temperature 97.9 F (36.6 C), temperature source Axillary, resp. rate (!) 24, height 5\' 3"  (1.6 m), weight 66.8 kg, SpO2 94%.    FiO2 (%):  [50 %-55 %] 50 %   Intake/Output Summary (Last 24 hours) at 03/04/2023 2130 Last data filed at 03/04/2023 8657 Gross per 24 hour  Intake 350 ml  Output 600 ml  Net -250 ml   Filed Weights   03/01/23 0844 03/01/23 2000  Weight: 67.1 kg 66.8 kg    Examination: General: Elderly woman, sitting up in bed, no distress HENT: Oropharynx clear, strong voice, no secretions, no stridor Lungs: No accessory muscle use, bilateral scattered posterior crackles, no rhonchi Cardiovascular: S1-S2 regular, no murmur Abdomen: Nondistended positive bowel sounds Extremities: Trace pretibial edema, warm and well-perfused Neuro: Alert, interactive, nonfocal GU: Deferred  Labs show normal electrolytes, no leukocytosis  Resolved Hospital Problem list     Assessment & Plan:   Acute respiratory failure with hypoxia New bilateral pulmonary infiltrates, differential includes  pneumonitis due to either her radiation or her immunotherapy Echo shows normal LV and RV function  -Cultures negative so far, negative procalcitonin, can DC antibiotics  -Continue day 3 steroids>> Solu-Medrol 125 mg twice daily x 3 days and then change to 60mg  every day, eventually to Prednisone for 3 weeks -Depending on workup may need to consider change from her current immunotherapy.  Oncology aware   Acute DVT subacute left pulmonary embolism  duplex confirms acute DVT in right common femoral vein, right femoral and popliteal vein -Continue Eliquis,  Hypertension Hypothyroidism  Constipation Normocytic anemia Moderate protein  malnutrition -As per [facility] plans   Best Practice (right click and "Reselect all SmartList Selections" daily)   Diet/type: Regular consistency (see orders) DVT prophylaxis: DOAC GI prophylaxis: N/A Lines: N/A Foley:  N/A Code Status:  full code Last date of multidisciplinary goals of care discussion [pending] updated patient and family at bedside  Labs   CBC: Recent Labs  Lab 03/01/23 0927 03/02/23 0455 03/03/23 0336 03/04/23 0500  WBC 7.5 6.0 4.6 8.2  NEUTROABS 6.3  --   --   --   HGB 10.8* 9.8* 11.4* 12.3  HCT 34.0* 32.1* 36.0 38.7  MCV 95.8 98.5 96.5 96.3  PLT 234 176 185 178    Basic Metabolic Panel: Recent Labs  Lab 03/01/23 0927 03/02/23 0455 03/03/23 0336 03/04/23 0500  NA 137 142 140 138  K 3.0* 3.8 3.3* 4.2  CL 102 107 102 102  CO2 24 26 29 26   GLUCOSE 101* 98 162* 158*  BUN 23 16 17 19   CREATININE 0.52 0.37* 0.46 0.48  CALCIUM 8.1* 8.4* 8.5* 8.8*  MG 2.2  --   --   --    GFR: Estimated Creatinine Clearance: 60.1 mL/min (by C-G formula based on SCr of 0.48 mg/dL). Recent Labs  Lab 03/01/23 0927 03/01/23 0942 03/02/23 0455 03/03/23 0336 03/04/23 0500  PROCALCITON <0.10  --  <0.10  --   --   WBC 7.5  --  6.0 4.6 8.2  LATICACIDVEN  --  0.9  --   --   --     Liver Function Tests: Recent Labs  Lab 03/01/23 0927 03/02/23 0455  AST 20 17  ALT 18 13  ALKPHOS 79 59  BILITOT 1.0 1.0  PROT 6.0* 5.8*  ALBUMIN 2.8* 3.3*   No results for input(s): "LIPASE", "AMYLASE" in the last 168 hours. No results for input(s): "AMMONIA" in the last 168 hours.  ABG    Component Value Date/Time   HCO3 29.1 (H) 03/02/2023 0455   O2SAT 78.6 03/02/2023 0455     Coagulation Profile: Recent Labs  Lab 03/01/23 0927  INR 1.2    Cardiac Enzymes: No results for input(s): "CKTOTAL", "CKMB", "CKMBINDEX", "TROPONINI" in the last 168 hours.  HbA1C: No results found for: "HGBA1C"  CBG: Recent Labs  Lab 03/02/23 0756  GLUCAP 94    [name] V. Vassie Loll  MD 03/04/2023, 8:52 AM [facility] [phone] or if no answer before 7:00PM call [phone] For any issues after 7:00PM please call eLink [phone]

## 2023-03-04 NOTE — Progress Notes (Signed)
DIAGNOSIS: Stage IV (T1b, N2, M1 C) non-small cell lung cancer, adenocarcinoma presented with right lower lobe lung nodule in addition to small bilateral pulmonary nodules and right hilar and mediastinal lymphadenopathy in addition to liver and bone metastasis diagnosed in April 2024. 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   PDL1: 20%   Guardant 360: no actionable mutations.    PRIOR THERAPY: 1) Decompressive thoracic laminectomy of the T7 with spinal cord decompression and biopsy of the epidural mass in April 2024.  2) Palliative radiation to the bone under the care of Dr. Basilio Cairo. Last day scheduled for 10/25/22    CURRENT THERAPY: Carboplatin for an AUC of 5, Alimta 500 mg/m, Keytruda 200 mg IV every 3 weeks.  First dose expected on 10/28/2022. Status post 6 cycles.  Starting from cycle #5 the patient is on maintenance treatment with Alimta and Keytruda every 3 weeks.  Subjective: The seen and examined today.  Her sister was at the bedside.  This is a very pleasant 72 years old white female with a stage IV non-small cell lung cancer, adenocarcinoma diagnosed in April 2024 with no actionable mutations and positive PD-L1 expression of 20%.  She started induction chemotherapy with carboplatin, Alimta and Keytruda for 4 cycles and she is currently on maintenance treatment with Alimta and Keytruda every 3 weeks status post 2 more cycles.  The patient has been tolerating this treatment fairly well.  She was admitted to [facility] on 03/01/2023 with significant respiratory distress as well as weakness and low-grade fever earlier that day.  Her oxygen saturation went down to around 50% on room air but improved with oxygen supplementation.  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  There was interval decrease in the size of the right liver lesion and unchanged osseous metastatic disease.  The scan showed filling defects of the bilateral common femoral veins concerning for deep venous thrombosis.  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  Patient had 2D echo 03/02/2023 and that showed ejection fraction of 55 to 60%.  She was seen by cardiology as well as critical care team.  She started treatment with Eliquis for the deep venous thrombosis.  She is also on high-dose steroid for the suspicious immunotherapy mediated pneumonitis.  She is feeling little bit better today.  She has no fever or chills.  She has no nausea, vomiting, diarrhea or constipation.   Objective: Vital signs in last 24 hours: Temp:  [97.9 F (36.6 C)-98.5 F (36.9 C)] 98 F (36.7 C) (09/24 0800) Pulse Rate:  [57-95] 93 (09/24 0900) Resp:  [0-24] 24 (09/24 0900) BP: (104-146)/(53-80) 127/80 (09/24 0900) SpO2:  [91 %-98 %] 94 % (09/24 0900) FiO2 (%):  [50 %-55 %] 50 % (09/24 0756)  Intake/Output from previous day: 09/23 0701 - 09/24 0700 In: 350 [IV Piggyback:350] Out: 600 [Urine:600] Intake/Output this  shift: Total I/O In: 88.3 [IV Piggyback:88.3] Out: -   General appearance: alert, cooperative, fatigued, and mild distress Resp: rales bilaterally Cardio: regular rate and rhythm, S1, S2 normal, no murmur, click, rub or gallop GI: soft, non-tender; bowel sounds normal; no masses,  no organomegaly Extremities:  extremities normal, atraumatic, no cyanosis or edema  Lab Results:  Recent Labs    03/03/23 0336 03/04/23 0500  WBC 4.6 8.2  HGB 11.4* 12.3  HCT 36.0 38.7  PLT 185 178   BMET Recent Labs    03/03/23 0336 03/04/23 0500  NA 140 138  K 3.3* 4.2  CL 102 102  CO2 29 26  GLUCOSE 162* 158*  BUN 17 19  CREATININE 0.46 0.48  CALCIUM 8.5* 8.8*    Studies/Results: No results found.  Medications: I have reviewed the patient's current medications.   Assessment/Plan: This is a very pleasant 72 years old white female with a stage IV non-small cell lung cancer, adenocarcinoma diagnosed in April 2024 with no actionable mutations and positive PD-L1 expression of 20%.  She started induction chemotherapy with carboplatin, Alimta and Keytruda for 4 cycles and she is currently on maintenance treatment with Alimta and Keytruda every 3 weeks status post 2 more cycles.  The patient has been tolerating this treatment fairly well. The patient was admitted to the hospital with respiratory distress secondary to questionable pneumonia, congestive heart failure or immunotherapy mediated pneumonitis.  She is current on treatment with high-dose Solu-Medrol and she is feeling much better. She was supposed to start cycle #7 of her treatment yesterday but this will be delayed by another 2 weeks or so until she has recovery from her condition. I discussed with the patient and the CT scan result and it did not show any evidence for disease progression and she is likely will continue with additional systemic therapy but I may hold her treatment with Keytruda for 1 or 2 cycles until complete resolution of the suspicious pneumonitis. I will arrange for the patient a follow-up appointment with me after discharge for reevaluation before resuming her treatment. For the deep venous thrombosis, she will continue her treatment with Eliquis. Thank you for taking good care of Ms. Aldea, please call if you have any  questions.   LOS: 3 days    Lajuana Matte 03/04/2023

## 2023-03-05 ENCOUNTER — Encounter: Payer: Self-pay | Admitting: Internal Medicine

## 2023-03-05 ENCOUNTER — Other Ambulatory Visit (HOSPITAL_COMMUNITY): Payer: Self-pay

## 2023-03-05 DIAGNOSIS — J9601 Acute respiratory failure with hypoxia: Secondary | ICD-10-CM | POA: Diagnosis not present

## 2023-03-05 LAB — URINE CULTURE: Culture: 60000 — AB

## 2023-03-05 LAB — BASIC METABOLIC PANEL
Anion gap: 9 (ref 5–15)
BUN: 22 mg/dL (ref 8–23)
CO2: 27 mmol/L (ref 22–32)
Calcium: 8.6 mg/dL — ABNORMAL LOW (ref 8.9–10.3)
Chloride: 102 mmol/L (ref 98–111)
Creatinine, Ser: 0.49 mg/dL (ref 0.44–1.00)
GFR, Estimated: >60 mL/min (ref >60)
Glucose, Bld: 148 mg/dL — ABNORMAL HIGH (ref 70–99)
Potassium: 4.3 mmol/L (ref 3.5–5.1)
Sodium: 138 mmol/L (ref 135–145)

## 2023-03-05 LAB — CBC
HCT: 39.7 % (ref 36.0–46.0)
Hemoglobin: 12.3 g/dL (ref 12.0–15.0)
MCH: 29.9 pg (ref 26.0–34.0)
MCHC: 31 g/dL (ref 30.0–36.0)
MCV: 96.6 fL (ref 80.0–100.0)
Platelets: 169 10*3/uL (ref 150–400)
RBC: 4.11 MIL/uL (ref 3.87–5.11)
RDW: 15.2 % (ref 11.5–15.5)
WBC: 8 10*3/uL (ref 4.0–10.5)
nRBC: 0 % (ref 0.0–0.2)

## 2023-03-05 MED ORDER — METHYLPREDNISOLONE SODIUM SUCC 125 MG IJ SOLR
60.0000 mg | INTRAMUSCULAR | Status: DC
  Administered 2023-03-05 – 2023-03-11 (×7): 60 mg via INTRAVENOUS
  Filled 2023-03-05 (×7): qty 2

## 2023-03-05 NOTE — TOC Benefit Eligibility Note (Signed)
Patient Product/process development scientist completed.    The patient is insured through [employer]/ [employer]. Patient has Medicare and is not eligible for a copay card, but may be able to apply for patient assistance, if available.    Ran test claim for Eliquis 5 mg and the current 30 day co-pay is $47.00 .   This test claim was processed through [facility]- copay amounts may vary at other pharmacies due to pharmacy/plan contracts, or as the patient moves through the different stages of their insurance plan.     Roland Earl, CPHT Pharmacy Technician III Certified Patient Advocate [facility] Patient Advocate Team Direct Number: [phone]  Fax: [phone]

## 2023-03-05 NOTE — Evaluation (Signed)
Physical Therapy Evaluation Patient Details Name: Hayley Wu MRN: [id number] DOB: Jan 28, 1951 Today's Date: 03/05/2023  History of Present Illness  Pt is 72 yo female admitted on 03/05/23 with acute respiratory failure and found to have new bil pulmonary infiltrates.  Pt with bil acute DVT started on Eliquis and subacute L PE.  Pt with hx including but not limited to stage IV adenocarcinoma lung, hepatic and bony metastases that required T7 laminectomy due to spinal cord compression 09/2022  Clinical Impression  Pt admitted with above diagnosis. At baseline, pt resides alone.  She typically uses a w/c when alone for safety but was working with outpt PT on walking and would walk when daughter visited.  Pt had assist getting in shower but otherwise independent with ADLs.  She is not on home O2 at baseline.  Today, pt was able to transfer and ambulate with min A using RW.  She did fatigue very easily.  Pt was on 3 L O2 at rest and required up to 5 L O2 with activity but with quick recovery (see general comments for details).  Pt expressed some concern about returning home at d/c b/c lives alone and states she tried staying with sister after her d/c in April and sister was not able to provide level of support needed.  She is open to post-acute rehab if needed.  At this time do recommend Patient will benefit from continued inpatient follow up therapy, <3 hours/day at d/c as pt does live alone, is below baseline, and fatigues very quickly.  However, pt does have good potential to progress to lower level of care depending on progress and length of stay.   Pt currently with functional limitations due to the deficits listed below (see PT Problem List). Pt will benefit from acute skilled PT to increase their independence and safety with mobility to allow discharge.           If plan is discharge home, recommend the following: A little help with walking and/or transfers;A little help with  bathing/dressing/bathroom;Assistance with cooking/housework;Help with stairs or ramp for entrance   Can travel by private vehicle   Yes    Equipment Recommendations None recommended by PT  Recommendations for Other Services       Functional Status Assessment Patient has had a recent decline in their functional status and demonstrates the ability to make significant improvements in function in a reasonable and predictable amount of time.     Precautions / Restrictions Precautions Precautions: Fall      Mobility  Bed Mobility Overal bed mobility: Needs Assistance Bed Mobility: Supine to Sit     Supine to sit: Min assist          Transfers Overall transfer level: Needs assistance Equipment used: Rolling walker (2 wheels) Transfers: Sit to/from Stand, Bed to chair/wheelchair/BSC Sit to Stand: Min assist   Step pivot transfers: Min assist       General transfer comment: light min A    Ambulation/Gait Ambulation/Gait assistance: Min assist Gait Distance (Feet): 30 Feet Assistive device: Rolling walker (2 wheels) Gait Pattern/deviations: Step-to pattern, Decreased stride length, Trunk flexed Gait velocity: decreased     General Gait Details: Min A to steady and min cues for pursed lip breathing; fatigued very easily  Stairs            Wheelchair Mobility     Tilt Bed    Modified Rankin (Stroke Patients Only)       Balance Overall balance assessment:  Needs assistance Sitting-balance support: No upper extremity supported Sitting balance-Leahy Scale: Good     Standing balance support: Bilateral upper extremity supported, Reliant on assistive device for balance Standing balance-Leahy Scale: Poor Standing balance comment: RW and CGA/min A                             Pertinent Vitals/Pain Pain Assessment Pain Assessment: No/denies pain    Home Living Family/patient expects to be discharged to:: Private residence Living  Arrangements: Alone Available Help at Discharge: Family;Available PRN/intermittently Type of Home: House Home Access: Level entry       Home Layout: One level Home Equipment: Tub bench;Rolling Walker (2 wheels);Wheelchair - manual      Prior Function Prior Level of Function : Needs assist             Mobility Comments: Pt was going to outpt PT about twice per week and working on walking more with RW.  SHe was using a w/c in home alone due to fall risk , but if daughter there would walk some. ADLs Comments: Independent with dressing and toielting; daughter assisted in shower but then pt able to shower.  Daughter assist with IADLs - pt able to fix light meals (microwave, sandwiches, etc)     Extremity/Trunk Assessment   Upper Extremity Assessment Upper Extremity Assessment: Overall WFL for tasks assessed    Lower Extremity Assessment Lower Extremity Assessment: LLE deficits/detail;RLE deficits/detail RLE Deficits / Details: ROM WFL; MMT: ankle dorsiflexion 4-/5 (does have AFO she wears for ambulating during therapy), knee ext 5/5, hip flex 5/5 LLE Deficits / Details: ROM WFL; MMT: 5/5 throughout    Cervical / Trunk Assessment Cervical / Trunk Assessment: Kyphotic  Communication      Cognition Arousal: Alert Behavior During Therapy: WFL for tasks assessed/performed Overall Cognitive Status: Within Functional Limits for tasks assessed                                 General Comments: motivated        General Comments General comments (skin integrity, edema, etc.): Pt was on 3 L O2 at arrival with sats 92%.  Maintained 88% with transfers on 3 L.  Increased to 4 L for ambulation and maintaining 88%, but with recovery dropped to 81% requiring increase to 5 L.  Pt recovered to 93% in <2 mins and able to decrease back to 3 L with sats 92%.  RN aware.    Exercises     Assessment/Plan    PT Assessment Patient needs continued PT services  PT Problem List  Decreased strength;Cardiopulmonary status limiting activity;Decreased activity tolerance;Decreased knowledge of use of DME;Decreased balance;Decreased mobility       PT Treatment Interventions DME instruction;Therapeutic exercise;Gait training;Stair training;Functional mobility training;Therapeutic activities;Patient/family education;Modalities;Balance training;Wheelchair mobility training    PT Goals (Current goals can be found in the Care Plan section)  Acute Rehab PT Goals Patient Stated Goal: return home but open to rehab if needed due to limited support PT Goal Formulation: With patient Time For Goal Achievement: 03/19/23 Potential to Achieve Goals: Good    Frequency Min 1X/week     Co-evaluation               AM-PAC PT "6 Clicks" Mobility  Outcome Measure Help needed turning from your back to your side while in a flat bed without using bedrails?: A Little  Help needed moving from lying on your back to sitting on the side of a flat bed without using bedrails?: A Little Help needed moving to and from a bed to a chair (including a wheelchair)?: A Little Help needed standing up from a chair using your arms (e.g., wheelchair or bedside chair)?: A Little Help needed to walk in hospital room?: A Little Help needed climbing 3-5 steps with a railing? : A Lot 6 Click Score: 17    End of Session Equipment Utilized During Treatment: Gait belt Activity Tolerance: Patient tolerated treatment well Patient left: in chair;with call bell/phone within reach Nurse Communication: Mobility status PT Visit Diagnosis: Other abnormalities of gait and mobility (R26.89)    Time: 4098-1191 PT Time Calculation (min) (ACUTE ONLY): 31 min   Charges:   PT Evaluation $PT Eval Moderate Complexity: 1 Mod PT Treatments $Therapeutic Activity: 8-22 mins PT General Charges $$ ACUTE PT VISIT: 1 Visit         Anise Salvo, PT Acute Rehab Services [facility] [phone]   Rayetta Humphrey 03/05/2023, 5:31 PM

## 2023-03-05 NOTE — Plan of Care (Signed)
  Problem: Activity: Goal: Ability to tolerate increased activity will improve Outcome: Progressing   Problem: Bowel/Gastric: Goal: Gastrointestinal status for postoperative course will improve Outcome: Progressing   Problem: Clinical Measurements: Goal: Ability to maintain clinical measurements within normal limits will improve Outcome: Progressing   Problem: Pain Management: Goal: Pain level will decrease Outcome: Progressing   Problem: Education: Goal: Knowledge of General Education information will improve Description: Including pain rating scale, medication(s)/side effects and non-pharmacologic comfort measures Outcome: Progressing   Problem: Clinical Measurements: Goal: Cardiovascular complication will be avoided Outcome: Progressing   Problem: Nutrition: Goal: Adequate nutrition will be maintained Outcome: Progressing   Problem: Elimination: Goal: Will not experience complications related to bowel motility Outcome: Progressing Goal: Will not experience complications related to urinary retention Outcome: Progressing

## 2023-03-05 NOTE — Progress Notes (Signed)
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 Patient also having cough occasionally productive, mild wheezing.  She has been prescribed mirtazapine recently for low appetite. In the ED: Hypoxic 50% on room air placed on nonrebreather.  Labs showed hypokalemia stable renal function albumin 2.8 Pro-Cal less than 0.1 lactic acid 0.9 BNP 152 CBC with hemoglobin 10.8 influenza COVID-19 screen negative, UA with WBC more than 50 large leukocytes. chest x-ray>> bilateral opacities right more than left concerning for pneumonia. CTA>> occluded left lower lobe pulmonary artery branch since 7/14, stable collapse of T7 with paraspinal soft tissue density and acute kyphosis, interval change of CHF, cardiomegaly interstitial pulmonary edema minimal right pleural effusion known primary lung carcinoma 2.9 cm right lower lobe stable and large lymph nodes, stable left humeral head sclerotic metastasis or infarct, COPD. Patient was wheezing and having rales on exam.  Placed on empiric antibiotic and admitted PCCM was consulted due to worsening hypoxia, echo obtained shows EF 55 to 60% G1 DD normal RV systolic function mitral valve normal aortic valve tricuspid. 9/22 Started on a steroid trial and will continue with empiric antibiotics.  Echocardiogram normal LV RV function.  Assessment & Plan:   Principal Problem:   Acute respiratory failure with hypoxia (HCC) Active Problems:   Hypokalemia   Hypertension   Hypothyroidism   Adenocarcinoma of right lung, stage 4 (HCC)   Constipation    Normocytic anemia   Moderate protein malnutrition (HCC)   Occlusion of left pulmonary artery (HCC)   Cardiomegaly   Pleural effusion due to CHF (congestive heart failure) (HCC)  Acute respiratory distress with acute hypoxic respiratory failure secondary to multifocal bilateral bronchopneumonia/pulmonary infiltrates Adenocarcinoma of right lung stage IV: Imaging shows bony spinal mets pulmonary nodules and lymphadenopathy along with bilateral multifocal opacities Differentials:pneumonia, opportunistic infections, pneumonitis due to either radiation or immunotherapy.BNP was up however echo with normal LV RV function got few doses of IV Lasix initially. PCCM managing appreciate input doing high-dose steroid trial with 125 mg x 3 days then to 60 mg daily.  No Solu-Medrol ordered.  I have ordered 1 today.  Incentive spirometry ordered since 03/02/2023 but patient has not been provided 1.  Discussed with primary RN today, patient to receive incentive spirometry education.  Patient encouraged to use it every hour.  Sister at the bedside also understood and will make sure to remind the patient.  Currently weaned down to 12 L of high flow oxygen. Continue supplemental oxygen bronchodilators, anticoagulant Eliquis.  Since procalcitonin negative-antibiotic  stopped 9/24 Dr Arbutus Ped aware of admission and agree w/ above management> per PCCM depending upon workup may need to consider change from her current immunotherapy.   Cardiomegaly: But TTE normal.BNP elevated some 162>264   Anemia Mld drop likley dilutional stable and up now  Acute bilateral LE DVT left common femoral and left proximal profunda vein Newly identified subacute left pulmonary embolism: Ct Occluded LLL pulm artery was since July.As planned per oncology-started on Eliquis therapeutic dose    Hypertension: BP well-controlled, meds on hold   Hypothyroidism: Continue levothyroxine   Hypokalemia: Resolved   Constipation: Continue  stool  softener, last BM 9/19   Protein malnutrition moderate: Augment diet, continue Remeron at bedtime Nutrition Status: Goals of care: I had discussed CODE STATUS with the patient 9/22 and she wished to be DNR no CPR or intubation, patient's 2 daughters at the bedside In agreement.  DVT prophylaxis: Eliquis   Code Status: Limited: Do not attempt resuscitation (DNR) -DNR-LIMITED -Do Not Intubate/DNI   Family Communication: Sister present at bedside.  Plan of care discussed with patient in length and he/she verbalized understanding and agreed with it.  Status is: Inpatient Remains inpatient appropriate because: Still on high amount of oxygen.   Estimated body mass index is 26.09 kg/m as calculated from the following:   Height as of this encounter: 5\' 3"  (1.6 m).   Weight as of this encounter: 66.8 kg.    Nutritional Assessment: Body mass index is 26.09 kg/m.Marland Kitchen Seen by dietician.  I agree with the assessment and plan as outlined below: Nutrition Status: Nutrition Problem: Moderate Malnutrition Etiology: chronic illness (stage IV adenocarcinoma of lung) Signs/Symptoms: mild fat depletion, mild muscle depletion Interventions: Refer to RD note for recommendations, Ensure Enlive (each supplement provides 350kcal and 20 grams of protein)  . Skin Assessment: I have examined the patient's skin and I agree with the wound assessment as performed by the wound care RN as outlined below:    Consultants:  Oncology-signed off Pulmonology  Procedures:  As above  Antimicrobials:  Anti-infectives (From admission, onward)    Start     Dose/Rate Route Frequency Ordered Stop   03/01/23 1015  azithromycin (ZITHROMAX) 500 mg in sodium chloride 0.9 % 250 mL IVPB  Status:  Discontinued        500 mg 250 mL/hr over 60 Minutes Intravenous Every 24 hours 03/01/23 1009 03/04/23 0856   03/01/23 1015  cefTRIAXone (ROCEPHIN) 1 g in sodium chloride 0.9 % 100 mL IVPB  Status:  Discontinued        1  g 200 mL/hr over 30 Minutes Intravenous Every 24 hours 03/01/23 1009 03/04/23 0856         Subjective: Patient seen and examined.  Sister at the bedside.  Patient states that her breathing is improving gradually.  She feels better than yesterday.  No other complaint.  Objective: Vitals:   03/05/23 0500 03/05/23 0600 03/05/23 0730 03/05/23 0930  BP: 118/69 (!) 136/59    Pulse: (!) 57 66    Resp: (!) 7 14    Temp:   98 F (36.7 C)   TempSrc:   Oral   SpO2: 96% 97%  98%  Weight:      Height:        Intake/Output Summary (Last 24 hours) at 03/05/2023 1000 Last data filed at 03/04/2023 2100 Gross per 24 hour  Intake 200 ml  Output 600 ml  Net -400 ml   Filed Weights   03/01/23 0844 03/01/23 2000  Weight: 67.1 kg 66.8 kg    Examination:  General exam: Appears calm and comfortable  Respiratory system: Rales bilaterally. Respiratory effort normal. Cardiovascular system: S1 & S2 heard, RRR. No JVD, murmurs, rubs, gallops or clicks. No pedal edema. Gastrointestinal system: Abdomen is nondistended, soft and nontender. No organomegaly or masses felt. Normal bowel sounds heard. Central nervous system: Alert and oriented. No focal neurological deficits. Extremities: Symmetric 5 x 5 power. Skin: No rashes, lesions or ulcers Psychiatry: Judgement and insight appear normal. Mood & affect appropriate.    Data Reviewed: I have personally reviewed following labs and imaging  studies  CBC: Recent Labs  Lab 03/01/23 0927 03/02/23 0455 03/03/23 0336 03/04/23 0500 03/05/23 0628  WBC 7.5 6.0 4.6 8.2 8.0  NEUTROABS 6.3  --   --   --   --   HGB 10.8* 9.8* 11.4* 12.3 12.3  HCT 34.0* 32.1* 36.0 38.7 39.7  MCV 95.8 98.5 96.5 96.3 96.6  PLT 234 176 185 178 169   Basic Metabolic Panel: Recent Labs  Lab 03/01/23 0927 03/02/23 0455 03/03/23 0336 03/04/23 0500 03/05/23 0628  NA 137 142 140 138 138  K 3.0* 3.8 3.3* 4.2 4.3  CL 102 107 102 102 102  CO2 24 26 29 26 27   GLUCOSE  101* 98 162* 158* 148*  BUN 23 16 17 19 22   CREATININE 0.52 0.37* 0.46 0.48 0.49  CALCIUM 8.1* 8.4* 8.5* 8.8* 8.6*  MG 2.2  --   --   --   --    GFR: Estimated Creatinine Clearance: 60.1 mL/min (by C-G formula based on SCr of 0.49 mg/dL). Liver Function Tests: Recent Labs  Lab 03/01/23 0927 03/02/23 0455  AST 20 17  ALT 18 13  ALKPHOS 79 59  BILITOT 1.0 1.0  PROT 6.0* 5.8*  ALBUMIN 2.8* 3.3*   No results for input(s): "LIPASE", "AMYLASE" in the last 168 hours. No results for input(s): "AMMONIA" in the last 168 hours. Coagulation Profile: Recent Labs  Lab 03/01/23 0927  INR 1.2   Cardiac Enzymes: No results for input(s): "CKTOTAL", "CKMB", "CKMBINDEX", "TROPONINI" in the last 168 hours. BNP (last 3 results) No results for input(s): "PROBNP" in the last 8760 hours. HbA1C: No results for input(s): "HGBA1C" in the last 72 hours. CBG: Recent Labs  Lab 03/02/23 0756  GLUCAP 94   Lipid Profile: No results for input(s): "CHOL", "HDL", "LDLCALC", "TRIG", "CHOLHDL", "LDLDIRECT" in the last 72 hours. Thyroid Function Tests: No results for input(s): "TSH", "T4TOTAL", "FREET4", "T3FREE", "THYROIDAB" in the last 72 hours. Anemia Panel: No results for input(s): "VITAMINB12", "FOLATE", "FERRITIN", "TIBC", "IRON", "RETICCTPCT" in the last 72 hours. Sepsis Labs: Recent Labs  Lab 03/01/23 0927 03/01/23 0942 03/02/23 0455  PROCALCITON <0.10  --  <0.10  LATICACIDVEN  --  0.9  --     Recent Results (from the past 240 hour(s))  Resp panel by RT-PCR (RSV, Flu A&B, Covid) Anterior Nasal Swab     Status: None   Collection Time: 03/01/23  9:27 AM   Specimen: Anterior Nasal Swab  Result Value Ref Range Status   SARS Coronavirus 2 by RT PCR NEGATIVE NEGATIVE Final    Comment: (NOTE) SARS-CoV-2 target nucleic acids are NOT DETECTED.  The SARS-CoV-2 RNA is generally detectable in upper respiratory specimens during the acute phase of infection. The lowest concentration of  SARS-CoV-2 viral copies this assay can detect is 138 copies/mL. A negative result does not preclude SARS-Cov-2 infection and should not be used as the sole basis for treatment or other patient management decisions. A negative result may occur with  improper specimen collection/handling, submission of specimen other than nasopharyngeal swab, presence of viral mutation(s) within the areas targeted by this assay, and inadequate number of viral copies(<138 copies/mL). A negative result must be combined with clinical observations, patient history, and epidemiological information. The expected result is Negative.  Fact Sheet for Patients:  BloggerCourse.com  Fact Sheet for Healthcare Providers:  SeriousBroker.it  This test is no t yet approved or cleared by the [place] FDA and  has been authorized for detection and/or diagnosis of SARS-CoV-2 by  FDA under an Emergency Use Authorization (EUA). This EUA will remain  in effect (meaning this test can be used) for the duration of the COVID-19 declaration under Section 564(b)(1) of the Act, 21 U.S.C.section 360bbb-3(b)(1), unless the authorization is terminated  or revoked sooner.       Influenza A by PCR NEGATIVE NEGATIVE Final   Influenza B by PCR NEGATIVE NEGATIVE Final    Comment: (NOTE) The Xpert Xpress SARS-CoV-2/FLU/RSV plus assay is intended as an aid in the diagnosis of influenza from Nasopharyngeal swab specimens and should not be used as a sole basis for treatment. Nasal washings and aspirates are unacceptable for Xpert Xpress SARS-CoV-2/FLU/RSV testing.  Fact Sheet for Patients: BloggerCourse.com  Fact Sheet for Healthcare Providers: SeriousBroker.it  This test is not yet approved or cleared by the [place] FDA and has been authorized for detection and/or diagnosis of SARS-CoV-2 by FDA under an Emergency Use  Authorization (EUA). This EUA will remain in effect (meaning this test can be used) for the duration of the COVID-19 declaration under Section 564(b)(1) of the Act, 21 U.S.C. section 360bbb-3(b)(1), unless the authorization is terminated or revoked.     Resp Syncytial Virus by PCR NEGATIVE NEGATIVE Final    Comment: (NOTE) Fact Sheet for Patients: BloggerCourse.com  Fact Sheet for Healthcare Providers: SeriousBroker.it  This test is not yet approved or cleared by the [place] FDA and has been authorized for detection and/or diagnosis of SARS-CoV-2 by FDA under an Emergency Use Authorization (EUA). This EUA will remain in effect (meaning this test can be used) for the duration of the COVID-19 declaration under Section 564(b)(1) of the Act, 21 U.S.C. section 360bbb-3(b)(1), unless the authorization is terminated or revoked.  Performed at [facility], [address], [place], [place] [address]   Blood Culture (routine x 2)     Status: None (Preliminary result)   Collection Time: 03/01/23  9:27 AM   Specimen: BLOOD RIGHT ARM  Result Value Ref Range Status   Specimen Description   Final    BLOOD RIGHT ARM Performed at [facility], [address] [place], [place] [address]    Special Requests   Final    BOTTLES DRAWN AEROBIC AND ANAEROBIC Blood Culture adequate volume Performed at [facility], [address] [place], [place] [address]    Culture   Final    NO GROWTH 4 DAYS Performed at [facility], [address], [place], [place] [address]    Report Status PENDING  Incomplete  Blood Culture (routine x 2)     Status: None (Preliminary result)   Collection Time: 03/01/23  9:49 AM   Specimen: BLOOD  Result Value Ref Range Status   Specimen Description   Final    BLOOD LEFT ANTECUBITAL Performed at [facility], [address] [place], [place]  [address]    Special Requests   Final    BOTTLES DRAWN AEROBIC AND ANAEROBIC Blood Culture adequate volume Performed at [facility], [address] [place], [place] [address]    Culture   Final    NO GROWTH 4 DAYS Performed at [facility], [address], [place], [place] [address]    Report Status PENDING  Incomplete  Urine Culture (for pregnant, neutropenic or urologic patients or patients with an indwelling urinary catheter)     Status: Abnormal   Collection Time: 03/01/23 11:58 AM   Specimen: Urine, Clean Catch  Result  Value Ref Range Status   Specimen Description   Final    URINE, CLEAN CATCH Performed at [facility], [address], [place], [place] [address]    Special Requests   Final    NONE Performed at [facility], [address] [place], [place] [address]    Culture 60,000 COLONIES/mL ESCHERICHIA COLI (A)  Final   Report Status 03/05/2023 FINAL  Final   Organism ID, Bacteria ESCHERICHIA COLI (A)  Final      Susceptibility   Escherichia coli - MIC*    AMPICILLIN 8 SENSITIVE Sensitive     CEFAZOLIN <=4 SENSITIVE Sensitive     CEFEPIME <=0.12 SENSITIVE Sensitive     CEFTRIAXONE <=0.25 SENSITIVE Sensitive     CIPROFLOXACIN <=0.25 SENSITIVE Sensitive     GENTAMICIN <=1 SENSITIVE Sensitive     IMIPENEM 0.5 SENSITIVE Sensitive     NITROFURANTOIN <=16 SENSITIVE Sensitive     TRIMETH/SULFA <=20 SENSITIVE Sensitive     AMPICILLIN/SULBACTAM 4 SENSITIVE Sensitive     PIP/TAZO <=4 SENSITIVE Sensitive     * 60,000 COLONIES/mL ESCHERICHIA COLI  MRSA Next Gen by PCR, Nasal     Status: None   Collection Time: 03/01/23  9:50 PM   Specimen: Nasal Mucosa; Nasal Swab  Result Value Ref Range Status   MRSA by PCR Next Gen NOT DETECTED NOT DETECTED Final    Comment: (NOTE) The GeneXpert MRSA Assay (FDA approved for NASAL specimens only), is one component of a comprehensive MRSA colonization surveillance program. It  is not intended to diagnose MRSA infection nor to guide or monitor treatment for MRSA infections. Test performance is not FDA approved in patients less than 28 years old. Performed at [facility], [address] [place], [place] [address]      Radiology Studies: No results found.  Scheduled Meds:  apixaban  10 mg Oral BID   Followed by   Melene Muller ON 03/08/2023] apixaban  5 mg Oral BID   Chlorhexidine Gluconate Cloth  6 each Topical QHS   feeding supplement  237 mL Oral BID BM   folic acid  1 mg Oral Daily   levothyroxine  100 mcg Oral Q0600   mirtazapine  30 mg Oral QHS   senna  1 tablet Oral Daily   Continuous Infusions:   LOS: 4 days   Hughie Closs, MD [facility]  03/05/2023, 10:00 AM   *Please note that this is a verbal dictation therefore any spelling or grammatical errors are due to the "Dragon Medical One" system interpretation.  Please page via Amion and do not message via secure chat for urgent patient care matters. Secure chat can be used for non urgent patient care matters.  How to contact the [facility] Attending or Consulting provider 7A - 7P or covering provider during after hours 7P -7A, for this patient?  Check the care team in [facility] and look for a) attending/consulting [facility] provider listed and b) the [facility] team listed. Page or secure chat 7A-7P. Log into [email or web address] and use [place]'s universal password to access. If you do not have the password, please contact the hospital operator. Locate the [facility] provider you are looking for under [facility] and page to a number that you can be directly reached. If you still have difficulty reaching the provider, please page the [facility] (Director on Call) for the Hospitalists listed on amion for assistance.

## 2023-03-05 NOTE — Progress Notes (Addendum)
NAME:  Hayley Wu, MRN:  [id number], DOB:  [birth year], LOS: 4 ADMISSION DATE:  03/01/2023, CONSULTATION DATE: 03/02/2023 REFERRING MD: Dr. Jonathon Bellows, Reason for consultation: Hypoxemia  History of Present Illness:  72 year old woman with a history of stage IV adenocarcinoma lung that presented with bilateral pulmonary nodules, right hilar and mediastinal adenopathy, hepatic and bony metastases (09/2022) required decompressive T7 laminectomy due to spinal cord compression and epidural mass.  Received palliative radiation T7, completed 10/25/2022 as well as chemotherapy, maintenance immunotherapy with Alimta and Keytruda (Dr. Arbutus Ped, last seen 02/11/2023). She had a surveillance CT CAP on 9/19 that showed suspected bilateral femoral filling defects, a newly identified subacute left pulmonary embolism.  Eliquis was started on 9/20.  She was admitted 9/21 with dyspnea, hypoxemia, lethargy and low-grade fever, minimally productive cough.  No clear sick exposures.  COVID-19, flu and RSV were all negative.  Pertinent  Medical History   Past Medical History:  Diagnosis Date   Allergy    Contact lens/glasses fitting    Hypertension   Stage IV adenocarcinoma lung with hepatic and bony metastases Hypertension Hypothyroidism  Significant Hospital Events: Including procedures, antibiotic start and stop dates in addition to other pertinent events   9/19 CT chest, abdomen, pelvis >> stable spiculated right lower lobe and right upper lobe nodules (smaller than original diagnosis), decrease right hepatic lesion, unchanged sclerotic osseous metastatic disease, new groundglass opacities medial bilateral lower lobes with some associated consolidation.  Question bilateral common femoral filling defects concerning for DVT 9/21 CT-PA >> left lower lobe PA pulmonary embolism, in retrospect may have been present as far back as 12/22/2022.  Diffuse increase in bilateral interstitial infiltrates.  Other findings  stable 9/22 echocardiogram >> normal LV and RV function  Interim History / Subjective:  Continues to improve Oxygen requirements decreased, on 10 L nasal cannula today  Afebrile    Objective   Blood pressure (!) 136/59, pulse 66, temperature 98 F (36.7 C), temperature source Oral, resp. rate 14, height 5\' 3"  (1.6 m), weight 66.8 kg, SpO2 98%.    FiO2 (%):  [42 %-45 %] 42 %   Intake/Output Summary (Last 24 hours) at 03/05/2023 1049 Last data filed at 03/04/2023 2100 Gross per 24 hour  Intake 200 ml  Output 600 ml  Net -400 ml   Filed Weights   03/01/23 0844 03/01/23 2000  Weight: 67.1 kg 66.8 kg    Examination: General: Elderly woman, sitting up in bed, no distress HENT: Oropharynx clear, strong voice, no secretions, no stridor Lungs: No accessory muscle use, able to speak in full sentences, bibasal scattered crackles Cardiovascular: S1-S2 regular, no murmur Abdomen: Nondistended positive bowel sounds Extremities no edema, no deformity Neuro: Alert, interactive, nonfocal GU: Deferred  Labs show normal electrolytes, no leukocytosis  Resolved Hospital Problem list     Assessment & Plan:   Acute respiratory failure with hypoxia New bilateral pulmonary infiltrates -lower lobe infiltrates likely related to radiation but diffuse groundglass and interstitial markings suspicious for immunotherapy related pneumonitis.  CT scan raises the question of acute congestive heart failure, but BNP was normal Echo shows normal LV and RV function  -Cultures negative so far, negative procalcitonin, can DC antibiotics  -Continue day 3 steroids>> received Solu-Medrol 125 mg twice daily x 3 days ,now dropped to 60mg  every day, eventually to Prednisone for 3 weeks -Defer to oncology regarding immunotherapy regimen -Chest x-ray tomorrow -Incentive spirometry and mobilize with PT   Acute DVT subacute left pulmonary embolism  duplex confirms  acute DVT in right common femoral vein, right  femoral and popliteal vein -Continue Eliquis  Hypertension Hypothyroidism Constipation Normocytic anemia Moderate protein malnutrition -As per [facility] plans   PCCM will be available as needed  Best Practice (right click and "Reselect all SmartList Selections" daily)   Diet/type: Regular consistency (see orders) DVT prophylaxis: DOAC GI prophylaxis: N/A Lines: N/A Foley:  N/A Code Status:  full code Last date of multidisciplinary goals of care discussion [pending] updated patient and sister at bedside  Labs   CBC: Recent Labs  Lab 03/01/23 0927 03/02/23 0455 03/03/23 0336 03/04/23 0500 03/05/23 0628  WBC 7.5 6.0 4.6 8.2 8.0  NEUTROABS 6.3  --   --   --   --   HGB 10.8* 9.8* 11.4* 12.3 12.3  HCT 34.0* 32.1* 36.0 38.7 39.7  MCV 95.8 98.5 96.5 96.3 96.6  PLT 234 176 185 178 169    Basic Metabolic Panel: Recent Labs  Lab 03/01/23 0927 03/02/23 0455 03/03/23 0336 03/04/23 0500 03/05/23 0628  NA 137 142 140 138 138  K 3.0* 3.8 3.3* 4.2 4.3  CL 102 107 102 102 102  CO2 24 26 29 26 27   GLUCOSE 101* 98 162* 158* 148*  BUN 23 16 17 19 22   CREATININE 0.52 0.37* 0.46 0.48 0.49  CALCIUM 8.1* 8.4* 8.5* 8.8* 8.6*  MG 2.2  --   --   --   --    GFR: Estimated Creatinine Clearance: 60.1 mL/min (by C-G formula based on SCr of 0.49 mg/dL). Recent Labs  Lab 03/01/23 0927 03/01/23 0942 03/02/23 0455 03/03/23 0336 03/04/23 0500 03/05/23 0628  PROCALCITON <0.10  --  <0.10  --   --   --   WBC 7.5  --  6.0 4.6 8.2 8.0  LATICACIDVEN  --  0.9  --   --   --   --     Liver Function Tests: Recent Labs  Lab 03/01/23 0927 03/02/23 0455  AST 20 17  ALT 18 13  ALKPHOS 79 59  BILITOT 1.0 1.0  PROT 6.0* 5.8*  ALBUMIN 2.8* 3.3*   No results for input(s): "LIPASE", "AMYLASE" in the last 168 hours. No results for input(s): "AMMONIA" in the last 168 hours.  ABG    Component Value Date/Time   HCO3 29.1 (H) 03/02/2023 0455   O2SAT 78.6 03/02/2023 0455     Coagulation  Profile: Recent Labs  Lab 03/01/23 0927  INR 1.2    Cardiac Enzymes: No results for input(s): "CKTOTAL", "CKMB", "CKMBINDEX", "TROPONINI" in the last 168 hours.  HbA1C: No results found for: "HGBA1C"  CBG: Recent Labs  Lab 03/02/23 0756  GLUCAP 94    Vanecia Limpert V. Vassie Loll MD 03/05/2023, 10:49 AM [facility] [phone] or if no answer before 7:00PM call [phone] For any issues after 7:00PM please call eLink [phone]

## 2023-03-06 ENCOUNTER — Inpatient Hospital Stay (HOSPITAL_COMMUNITY): Payer: PPO

## 2023-03-06 DIAGNOSIS — J9601 Acute respiratory failure with hypoxia: Secondary | ICD-10-CM | POA: Diagnosis not present

## 2023-03-06 LAB — BASIC METABOLIC PANEL
Anion gap: 5 (ref 5–15)
BUN: 23 mg/dL (ref 8–23)
CO2: 29 mmol/L (ref 22–32)
Calcium: 8.5 mg/dL — ABNORMAL LOW (ref 8.9–10.3)
Chloride: 103 mmol/L (ref 98–111)
Creatinine, Ser: 0.42 mg/dL — ABNORMAL LOW (ref 0.44–1.00)
GFR, Estimated: >60 mL/min (ref >60)
Glucose, Bld: 104 mg/dL — ABNORMAL HIGH (ref 70–99)
Potassium: 4.3 mmol/L (ref 3.5–5.1)
Sodium: 137 mmol/L (ref 135–145)

## 2023-03-06 LAB — CBC
HCT: 38.8 % (ref 36.0–46.0)
Hemoglobin: 12 g/dL (ref 12.0–15.0)
MCH: 30.8 pg (ref 26.0–34.0)
MCHC: 30.9 g/dL (ref 30.0–36.0)
MCV: 99.5 fL (ref 80.0–100.0)
Platelets: 155 10*3/uL (ref 150–400)
RBC: 3.9 MIL/uL (ref 3.87–5.11)
RDW: 15.3 % (ref 11.5–15.5)
WBC: 7.4 10*3/uL (ref 4.0–10.5)
nRBC: 0 % (ref 0.0–0.2)

## 2023-03-06 LAB — CULTURE, BLOOD (ROUTINE X 2)
Culture: NO GROWTH
Special Requests: ADEQUATE
Special Requests: ADEQUATE

## 2023-03-06 NOTE — Progress Notes (Signed)
PHYSICAL THERAPY  SATURATION QUALIFICATIONS: (This note is used to comply with regulatory documentation for home oxygen)  Patient Saturations on Room Air at Rest = 85%  Patient Saturations on Room Air while Ambulating = 84%  Patient Saturations on  4 Liters of oxygen while Ambulating = 90%  Please briefly explain why patient needs home oxygen:  Pt currently requires 2 lts of supplemental oxygen at rest and 4 lts with activity to achieve therapeutic levels.  Felecia Shelling  PTA Acute  Rehabilitation Services Office M-F          [phone]

## 2023-03-06 NOTE — Progress Notes (Signed)
PHARMACY - PHYSICIAN COMMUNICATION CRITICAL VALUE ALERT - BLOOD CULTURE IDENTIFICATION (BCID)  Hayley Wu is an 72 y.o. female who presented to [facility] on 03/01/2023 with Acute respiratory distress with acute hypoxic respiratory failure, acute bilateral DVT, new PE.  Assessment:  BCID + for Unidentified GPR found in 1 out of 4 bottles. Likely contaminant.  Patient currently AF, WBC 8.  Abx d/c'ed on 9/24 due to negative procalcitonin.  Name of physician (or Provider) Contacted: Anthoney Harada, FNP  Current antibiotics: none  Changes to prescribed antibiotics recommended:  No changes at this time  No results found for this or any previous visit.  Maryellen Pile, PharmD 03/06/2023  3:19 AM

## 2023-03-06 NOTE — Progress Notes (Signed)
Physical Therapy Treatment Patient Details Name: Hayley Wu MRN: [id number] DOB: [birth year] Today's Date: 03/06/2023   History of Present Illness Pt is 72 yo female admitted on 03/05/23 with acute respiratory failure and found to have new bil pulmonary infiltrates.  Pt with bil acute DVT started on Eliquis and subacute L PE.  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  Her 2 sisters at bedside.  Assisted OOB to amb.  General bed mobility comments: pt self able with increased effort and use of rails. General transfer comment: VC's on proper hand placement and increased effort to rise due to weakness. General Gait Details: VC's on upright posture and "deep breathing" while monitoring vitals.  Pt required 4 lts oxygen during activity to achieve sats of 90%.  HR avg 104.  Pt tolerated amb 17 feet then required a seated rest break before amb another 9 feet.  Dyspnea 3/4.   SATURATION QUALIFICATIONS: (This note is used to comply with regulatory documentation for home oxygen)   Patient Saturations on Room Air at Rest = 85%   Patient Saturations on Room Air while Ambulating = 84%   Patient Saturations on  4 Liters of oxygen while Ambulating = 90%   Please briefly explain why patient needs home oxygen:  Pt currently requires 2 lts of supplemental oxygen at rest and 4 lts with activity to achieve therapeutic levels.   LPT has rec SNF as pt lives home alone.  Pt could possibly progress to home but will need family support.     If plan is discharge home, recommend the following: A little help with walking and/or transfers;A little help with bathing/dressing/bathroom;Assistance with cooking/housework;Help with stairs or ramp for entrance   Can travel by private vehicle     Yes  Equipment  Recommendations  None recommended by PT    Recommendations for Other Services       Precautions / Restrictions Precautions Precautions: Fall Precaution Comments: monitor sats Restrictions Weight Bearing Restrictions: No     Mobility  Bed Mobility Overal bed mobility: Needs Assistance Bed Mobility: Supine to Sit     Supine to sit: Contact guard, Supervision     General bed mobility comments: pt self able with increased effort and use of rails.    Transfers Overall transfer level: Needs assistance Equipment used: Rolling walker (2 wheels) Transfers: Sit to/from Stand Sit to Stand: Supervision, Contact guard assist           General transfer comment: VC's on proper hand placement and increased effort to rise due to weakness.    Ambulation/Gait Ambulation/Gait assistance: Min assist, +2 physical assistance, +2 safety/equipment Gait Distance (Feet): 26 Feet (17 feet + 9 feet) Assistive device: Rolling walker (2 wheels) Gait Pattern/deviations: Step-to pattern, Decreased stride length, Trunk flexed Gait velocity: decreased     General Gait Details: VC's on upright posture and "deep breathing" while monitoring vitals.  Pt required 4 lts oxygen during activity to achieve sats of 90%.  HR avg 104.  Pt tolerated amb 17 feet then required a seated rest break before amb another 9 feet.  Dyspnea 3/4.   Stairs             Wheelchair Mobility     Tilt Bed    Modified Rankin (  Stroke Patients Only)       Balance                                            Cognition Arousal: Alert Behavior During Therapy: WFL for tasks assessed/performed Overall Cognitive Status: Within Functional Limits for tasks assessed                                 General Comments: AxO x 3 pleasant and motivated.  Pt lives home alone.        Exercises      General Comments        Pertinent Vitals/Pain      Home Living                           Prior Function            PT Goals (current goals can now be found in the care plan section) Progress towards PT goals: Progressing toward goals    Frequency    Min 1X/week      PT Plan      Co-evaluation              AM-PAC PT "6 Clicks" Mobility   Outcome Measure  Help needed turning from your back to your side while in a flat bed without using bedrails?: A Little Help needed moving from lying on your back to sitting on the side of a flat bed without using bedrails?: A Little Help needed moving to and from a bed to a chair (including a wheelchair)?: A Little Help needed standing up from a chair using your arms (e.g., wheelchair or bedside chair)?: A Little Help needed to walk in hospital room?: A Little Help needed climbing 3-5 steps with a railing? : A Lot 6 Click Score: 17    End of Session Equipment Utilized During Treatment: Gait belt Activity Tolerance: Patient tolerated treatment well Patient left: in chair;with call bell/phone within reach Nurse Communication: Mobility status PT Visit Diagnosis: Other abnormalities of gait and mobility (R26.89)     Time: 1030-1100 PT Time Calculation (min) (ACUTE ONLY): 30 min  Charges:    $Gait Training: 8-22 mins $Therapeutic Activity: 8-22 mins PT General Charges $$ ACUTE PT VISIT: 1 Visit                     Felecia Shelling  PTA Acute  Rehabilitation Services Office M-F          [phone]

## 2023-03-06 NOTE — Progress Notes (Signed)
PROGRESS NOTE    Hayley Wu  ZOX:[id number] DOB: 26-Apr-1951 DOA: 03/01/2023 PCP: [name], PA-C   Brief Narrative:  72 yof w/ history significant of seasonal allergies, hypertension, tobacco use, hypothyroidism, constipation 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 Patient also having cough occasionally productive, mild wheezing.  She has been prescribed mirtazapine recently for low appetite. In the ED: Hypoxic 50% on room air placed on nonrebreather.  Labs showed hypokalemia stable renal function albumin 2.8 Pro-Cal less than 0.1 lactic acid 0.9 BNP 152 CBC with hemoglobin 10.8 influenza COVID-19 screen negative, UA with WBC more than 50 large leukocytes. chest x-ray>> bilateral opacities right more than left concerning for pneumonia. CTA>> occluded left lower lobe pulmonary artery branch since 7/14, stable collapse of T7 with paraspinal soft tissue density and acute kyphosis, interval change of CHF, cardiomegaly interstitial pulmonary edema minimal right pleural effusion known primary lung carcinoma 2.9 cm right lower lobe stable and large lymph nodes, stable left humeral head sclerotic metastasis or infarct, COPD. Patient was wheezing and having rales on exam.  Placed on empiric antibiotic and admitted PCCM was consulted due to worsening hypoxia, echo obtained shows EF 55 to 60% G1 DD normal RV systolic function mitral valve normal aortic valve tricuspid. 9/22 Started on a steroid trial and will continue with empiric antibiotics.  Echocardiogram normal LV RV function.  Assessment & Plan:   Principal Problem:   Acute respiratory failure with hypoxia (HCC) Active Problems:   Hypokalemia   Hypertension   Hypothyroidism   Adenocarcinoma of right lung, stage 4 (HCC)   Constipation    Normocytic anemia   Moderate protein malnutrition (HCC)   Occlusion of left pulmonary artery (HCC)   Cardiomegaly   Pleural effusion due to CHF (congestive heart failure) (HCC)  Acute respiratory distress with acute hypoxic respiratory failure secondary to multifocal bilateral bronchopneumonia/pulmonary infiltrates Adenocarcinoma of right lung stage IV: Imaging shows bony spinal mets pulmonary nodules and lymphadenopathy along with bilateral multifocal opacities Differentials:pneumonia, opportunistic infections, pneumonitis due to either radiation or immunotherapy.BNP was up however echo with normal LV RV function got few doses of IV Lasix initially. PCCM managing appreciate input doing high-dose steroid trial with 125 mg x 3 days then to 60 mg daily.  Patient on prednisone 60 mg p.o. daily per PCCM recommendation, they also recommend that patient will eventually need prednisone for 3 weeks.  Patient has been weaned down to 5 L high flow oxygen today, much improved compared to yesterday.  Patient encouraged to use incentive spirometry. Continue supplemental oxygen bronchodilators, anticoagulant Eliquis.  Since procalcitonin negative-antibiotic  stopped 9/24 Dr Arbutus Ped aware of admission and agree w/ above management> per PCCM depending upon workup may need to consider change from her current immunotherapy.   Cardiomegaly: But TTE normal.BNP elevated some 162>264   Anemia Mld drop likley dilutional stable and up now  Acute bilateral LE DVT left common femoral and left proximal profunda vein Newly identified subacute left pulmonary embolism: Ct Occluded LLL pulm artery was since July.As planned per oncology-started on Eliquis therapeutic dose    Hypertension: BP well-controlled, meds on hold   Hypothyroidism: Continue levothyroxine   Hypokalemia: Resolved   Constipation: Continue stool softener, last BM 9/19   Protein malnutrition moderate: Augment diet, continue Remeron at  bedtime Nutrition Status: Goals of care: Previous  hospitalist had discussed CODE STATUS with the patient 9/22 and she wished to be DNR no CPR or intubation, patient's 2 daughters at the bedside In agreement.  DVT prophylaxis: Eliquis   Code Status: Limited: Do not attempt resuscitation (DNR) -DNR-LIMITED -Do Not Intubate/DNI   Family Communication: Sister present at bedside.  Plan of care discussed with patient in length and he/she verbalized understanding and agreed with it.  Status is: Inpatient Remains inpatient appropriate because: Still on high amount of oxygen.   Estimated body mass index is 26.09 kg/m as calculated from the following:   Height as of this encounter: 5\' 3"  (1.6 m).   Weight as of this encounter: 66.8 kg.    Nutritional Assessment: Body mass index is 26.09 kg/m.Marland Kitchen Seen by dietician.  I agree with the assessment and plan as outlined below: Nutrition Status: Nutrition Problem: Moderate Malnutrition Etiology: chronic illness (stage IV adenocarcinoma of lung) Signs/Symptoms: mild fat depletion, mild muscle depletion Interventions: Refer to RD note for recommendations, Ensure Enlive (each supplement provides 350kcal and 20 grams of protein)  . Skin Assessment: I have examined the patient's skin and I agree with the wound assessment as performed by the wound care RN as outlined below:    Consultants:  Oncology-signed off Pulmonology  Procedures:  As above  Antimicrobials:  Anti-infectives (From admission, onward)    Start     Dose/Rate Route Frequency Ordered Stop   03/01/23 1015  azithromycin (ZITHROMAX) 500 mg in sodium chloride 0.9 % 250 mL IVPB  Status:  Discontinued        500 mg 250 mL/hr over 60 Minutes Intravenous Every 24 hours 03/01/23 1009 03/04/23 0856   03/01/23 1015  cefTRIAXone (ROCEPHIN) 1 g in sodium chloride 0.9 % 100 mL IVPB  Status:  Discontinued        1 g 200 mL/hr over 30 Minutes Intravenous Every 24 hours 03/01/23 1009 03/04/23  0856         Subjective: Patient seen and examined.  No family member present at bedside.  Patient fully alert and oriented.  She stated that her breathing is improving.  She was happy with the progress.  Objective: Vitals:   03/06/23 0755 03/06/23 0900 03/06/23 1000 03/06/23 1200  BP:  (!) 152/70 (!) 141/75   Pulse:  79 75   Resp:  19 17   Temp: 98 F (36.7 C)   98.1 F (36.7 C)  TempSrc: Oral   Axillary  SpO2:  99% 93%   Weight:      Height:        Intake/Output Summary (Last 24 hours) at 03/06/2023 1248 Last data filed at 03/05/2023 2100 Gross per 24 hour  Intake --  Output 350 ml  Net -350 ml   Filed Weights   03/01/23 0844 03/01/23 2000  Weight: 67.1 kg 66.8 kg    Examination: General exam: Appears calm and comfortable  Respiratory system: Rales at the bases bilaterally, much improved examination compared to yesterday. Respiratory effort normal. Cardiovascular system: S1 & S2 heard, RRR. No JVD, murmurs, rubs, gallops or clicks. No pedal edema. Gastrointestinal system: Abdomen is nondistended, soft and nontender. No organomegaly or masses felt. Normal bowel sounds heard. Central nervous system: Alert and oriented. No focal neurological deficits. Extremities: Symmetric 5 x 5 power. Skin: No rashes, lesions or ulcers.  Psychiatry: Judgement and insight appear normal. Mood & affect appropriate.   Data Reviewed: I have personally reviewed following labs and imaging studies  CBC: Recent Labs  Lab  03/01/23 1610 03/02/23 0455 03/03/23 0336 03/04/23 0500 03/05/23 0628 03/06/23 0617  WBC 7.5 6.0 4.6 8.2 8.0 7.4  NEUTROABS 6.3  --   --   --   --   --   HGB 10.8* 9.8* 11.4* 12.3 12.3 12.0  HCT 34.0* 32.1* 36.0 38.7 39.7 38.8  MCV 95.8 98.5 96.5 96.3 96.6 99.5  PLT 234 176 185 178 169 155   Basic Metabolic Panel: Recent Labs  Lab 03/01/23 0927 03/02/23 0455 03/03/23 0336 03/04/23 0500 03/05/23 0628 03/06/23 0617  NA 137 142 140 138 138 137  K 3.0* 3.8  3.3* 4.2 4.3 4.3  CL 102 107 102 102 102 103  CO2 24 26 29 26 27 29   GLUCOSE 101* 98 162* 158* 148* 104*  BUN 23 16 17 19 22 23   CREATININE 0.52 0.37* 0.46 0.48 0.49 0.42*  CALCIUM 8.1* 8.4* 8.5* 8.8* 8.6* 8.5*  MG 2.2  --   --   --   --   --    GFR: Estimated Creatinine Clearance: 60.1 mL/min (A) (by C-G formula based on SCr of 0.42 mg/dL (L)). Liver Function Tests: Recent Labs  Lab 03/01/23 0927 03/02/23 0455  AST 20 17  ALT 18 13  ALKPHOS 79 59  BILITOT 1.0 1.0  PROT 6.0* 5.8*  ALBUMIN 2.8* 3.3*   No results for input(s): "LIPASE", "AMYLASE" in the last 168 hours. No results for input(s): "AMMONIA" in the last 168 hours. Coagulation Profile: Recent Labs  Lab 03/01/23 0927  INR 1.2   Cardiac Enzymes: No results for input(s): "CKTOTAL", "CKMB", "CKMBINDEX", "TROPONINI" in the last 168 hours. BNP (last 3 results) No results for input(s): "PROBNP" in the last 8760 hours. HbA1C: No results for input(s): "HGBA1C" in the last 72 hours. CBG: Recent Labs  Lab 03/02/23 0756  GLUCAP 94   Lipid Profile: No results for input(s): "CHOL", "HDL", "LDLCALC", "TRIG", "CHOLHDL", "LDLDIRECT" in the last 72 hours. Thyroid Function Tests: No results for input(s): "TSH", "T4TOTAL", "FREET4", "T3FREE", "THYROIDAB" in the last 72 hours. Anemia Panel: No results for input(s): "VITAMINB12", "FOLATE", "FERRITIN", "TIBC", "IRON", "RETICCTPCT" in the last 72 hours. Sepsis Labs: Recent Labs  Lab 03/01/23 0927 03/01/23 0942 03/02/23 0455  PROCALCITON <0.10  --  <0.10  LATICACIDVEN  --  0.9  --     Recent Results (from the past 240 hour(s))  Resp panel by RT-PCR (RSV, Flu A&B, Covid) Anterior Nasal Swab     Status: None   Collection Time: 03/01/23  9:27 AM   Specimen: Anterior Nasal Swab  Result Value Ref Range Status   SARS Coronavirus 2 by RT PCR NEGATIVE NEGATIVE Final    Comment: (NOTE) SARS-CoV-2 target nucleic acids are NOT DETECTED.  The SARS-CoV-2 RNA is generally  detectable in upper respiratory specimens during the acute phase of infection. The lowest concentration of SARS-CoV-2 viral copies this assay can detect is 138 copies/mL. A negative result does not preclude SARS-Cov-2 infection and should not be used as the sole basis for treatment or other patient management decisions. A negative result may occur with  improper specimen collection/handling, submission of specimen other than nasopharyngeal swab, presence of viral mutation(s) within the areas targeted by this assay, and inadequate number of viral copies(<138 copies/mL). A negative result must be combined with clinical observations, patient history, and epidemiological information. The expected result is Negative.  Fact Sheet for Patients:  BloggerCourse.com  Fact Sheet for Healthcare Providers:  SeriousBroker.it  This test is no t yet approved or  cleared by the [place] and  has been authorized for detection and/or diagnosis of SARS-CoV-2 by FDA under an Emergency Use Authorization (EUA). This EUA will remain  in effect (meaning this test can be used) for the duration of the COVID-19 declaration under Section 564(b)(1) of the Act, 21 U.S.C.section 360bbb-3(b)(1), unless the authorization is terminated  or revoked sooner.       Influenza A by PCR NEGATIVE NEGATIVE Final   Influenza B by PCR NEGATIVE NEGATIVE Final    Comment: (NOTE) The Xpert Xpress SARS-CoV-2/FLU/RSV plus assay is intended as an aid in the diagnosis of influenza from Nasopharyngeal swab specimens and should not be used as a sole basis for treatment. Nasal washings and aspirates are unacceptable for Xpert Xpress SARS-CoV-2/FLU/RSV testing.  Fact Sheet for Patients: BloggerCourse.com  Fact Sheet for Healthcare Providers: SeriousBroker.it  This test is not yet approved or cleared by the [place] FDA  and has been authorized for detection and/or diagnosis of SARS-CoV-2 by FDA under an Emergency Use Authorization (EUA). This EUA will remain in effect (meaning this test can be used) for the duration of the COVID-19 declaration under Section 564(b)(1) of the Act, 21 U.S.C. section 360bbb-3(b)(1), unless the authorization is terminated or revoked.     Resp Syncytial Virus by PCR NEGATIVE NEGATIVE Final    Comment: (NOTE) Fact Sheet for Patients: BloggerCourse.com  Fact Sheet for Healthcare Providers: SeriousBroker.it  This test is not yet approved or cleared by the [place] FDA and has been authorized for detection and/or diagnosis of SARS-CoV-2 by FDA under an Emergency Use Authorization (EUA). This EUA will remain in effect (meaning this test can be used) for the duration of the COVID-19 declaration under Section 564(b)(1) of the Act, 21 U.S.C. section 360bbb-3(b)(1), unless the authorization is terminated or revoked.  Performed at [facility], [address], [place], [place] [address]   Blood Culture (routine x 2)     Status: None   Collection Time: 03/01/23  9:27 AM   Specimen: BLOOD RIGHT ARM  Result Value Ref Range Status   Specimen Description   Final    BLOOD RIGHT ARM Performed at [facility], [address], [place], [place] [address]    Special Requests   Final    BOTTLES DRAWN AEROBIC AND ANAEROBIC Blood Culture adequate volume Performed at [facility], [address], [place], [place] [address]    Culture   Final    NO GROWTH 5 DAYS Performed at [facility], [address] [place], [place] [address]    Report Status 03/06/2023 FINAL  Final  Blood Culture (routine x 2)     Status: None (Preliminary result)   Collection Time: 03/01/23  9:49 AM   Specimen: BLOOD  Result Value Ref Range Status   Specimen Description   Final    BLOOD LEFT  ANTECUBITAL Performed at [facility], [address] [place], [place] [address]    Special Requests   Final    BOTTLES DRAWN AEROBIC AND ANAEROBIC Blood Culture adequate volume Performed at [facility], [address] [place], [place] [address]    Culture  Setup Time   Final    GRAM POSITIVE RODS ANAEROBIC BOTTLE ONLY CRITICAL RESULT CALLED TO, READ BACK BY AND VERIFIED WITH: L POINDEXTER,[email or web address]  03/06/23 MK Performed at [facility], [address], [place], [place] [address]    Culture GRAM POSITIVE RODS  Final  Report Status PENDING  Incomplete  Urine Culture (for pregnant, neutropenic or urologic patients or patients with an indwelling urinary catheter)     Status: Abnormal   Collection Time: 03/01/23 11:58 AM   Specimen: Urine, Clean Catch  Result Value Ref Range Status   Specimen Description   Final    URINE, CLEAN CATCH Performed at [facility], [address], [place], [place] [address]    Special Requests   Final    NONE Performed at [facility], [address], [place], [place] [address]    Culture 60,000 COLONIES/mL ESCHERICHIA COLI (A)  Final   Report Status 03/05/2023 FINAL  Final   Organism ID, Bacteria ESCHERICHIA COLI (A)  Final      Susceptibility   Escherichia coli - MIC*    AMPICILLIN 8 SENSITIVE Sensitive     CEFAZOLIN <=4 SENSITIVE Sensitive     CEFEPIME <=0.12 SENSITIVE Sensitive     CEFTRIAXONE <=0.25 SENSITIVE Sensitive     CIPROFLOXACIN <=0.25 SENSITIVE Sensitive     GENTAMICIN <=1 SENSITIVE Sensitive     IMIPENEM 0.5 SENSITIVE Sensitive     NITROFURANTOIN <=16 SENSITIVE Sensitive     TRIMETH/SULFA <=20 SENSITIVE Sensitive     AMPICILLIN/SULBACTAM 4 SENSITIVE Sensitive     PIP/TAZO <=4 SENSITIVE Sensitive     * 60,000 COLONIES/mL ESCHERICHIA COLI  MRSA Next Gen by PCR, Nasal     Status: None   Collection Time: 03/01/23  9:50 PM   Specimen: Nasal Mucosa; Nasal Swab   Result Value Ref Range Status   MRSA by PCR Next Gen NOT DETECTED NOT DETECTED Final    Comment: (NOTE) The GeneXpert MRSA Assay (FDA approved for NASAL specimens only), is one component of a comprehensive MRSA colonization surveillance program. It is not intended to diagnose MRSA infection nor to guide or monitor treatment for MRSA infections. Test performance is not FDA approved in patients less than 12 years old. Performed at [facility], [address] [place], [place] [address]      Radiology Studies: [facility] Chest Port 1 View  Result Date: 03/06/2023 CLINICAL DATA:  Acute respiratory failure. EXAM: PORTABLE CHEST 1 VIEW COMPARISON:  March 02, 2023 FINDINGS: Stable cardiomediastinal silhouette. Right internal jugular Port-A-Cath is unchanged. Decreased bilateral lung opacities are noted suggesting improving pneumonia or edema. Bony thorax is unremarkable. IMPRESSION: Decreased bilateral lung opacities are noted consistent with improving pneumonia or edema. Electronically Signed   By: Lupita Raider M.D.   On: 03/06/2023 09:15    Scheduled Meds:  apixaban  10 mg Oral BID   Followed by   Melene Muller ON 03/08/2023] apixaban  5 mg Oral BID   Chlorhexidine Gluconate Cloth  6 each Topical QHS   feeding supplement  237 mL Oral BID BM   folic acid  1 mg Oral Daily   levothyroxine  100 mcg Oral Q0600   methylPREDNISolone (SOLU-MEDROL) injection  60 mg Intravenous Q24H   mirtazapine  30 mg Oral QHS   senna  1 tablet Oral Daily   Continuous Infusions:   LOS: 5 days   Hughie Closs, MD [facility]  03/06/2023, 12:48 PM   *Please note that this is a verbal dictation therefore any spelling or grammatical errors are due to the "Dragon Medical One" system interpretation.  Please page via Amion and do not message via secure chat for urgent patient care matters. Secure chat can be used for non urgent patient care matters.  How to  contact the [facility] Attending or  Consulting provider 7A - 7P or covering provider during after hours 7P -7A, for this patient?  Check the care team in [facility] and look for a) attending/consulting [facility] provider listed and b) the [facility] team listed. Page or secure chat 7A-7P. Log into [email or web address] and use [place]'s universal password to access. If you do not have the password, please contact the hospital operator. Locate the [facility] provider you are looking for under [facility] and page to a number that you can be directly reached. If you still have difficulty reaching the provider, please page the [facility] (Director on Call) for the Hospitalists listed on amion for assistance.

## 2023-03-06 NOTE — Plan of Care (Signed)
  Problem: Education: Goal: Knowledge of the prescribed therapeutic regimen will improve Outcome: Progressing   Problem: Activity: Goal: Ability to avoid complications of mobility impairment will improve Outcome: Progressing Goal: Ability to tolerate increased activity will improve Outcome: Progressing Goal: Will remain free from falls Outcome: Progressing   Problem: Pain Management: Goal: Pain level will decrease Outcome: Progressing   Problem: Education: Goal: Knowledge of General Education information will improve Description: Including pain rating scale, medication(s)/side effects and non-pharmacologic comfort measures Outcome: Progressing

## 2023-03-07 ENCOUNTER — Other Ambulatory Visit: Payer: Self-pay

## 2023-03-07 DIAGNOSIS — J9601 Acute respiratory failure with hypoxia: Secondary | ICD-10-CM | POA: Diagnosis not present

## 2023-03-07 LAB — CBC
HCT: 42 % (ref 36.0–46.0)
Hemoglobin: 13 g/dL (ref 12.0–15.0)
MCH: 30.2 pg (ref 26.0–34.0)
MCHC: 31 g/dL (ref 30.0–36.0)
MCV: 97.4 fL (ref 80.0–100.0)
Platelets: 171 10*3/uL (ref 150–400)
RBC: 4.31 MIL/uL (ref 3.87–5.11)
RDW: 15.5 % (ref 11.5–15.5)
WBC: 7.6 10*3/uL (ref 4.0–10.5)
nRBC: 0 % (ref 0.0–0.2)

## 2023-03-07 LAB — BASIC METABOLIC PANEL
Anion gap: 8 (ref 5–15)
BUN: 21 mg/dL (ref 8–23)
CO2: 27 mmol/L (ref 22–32)
Calcium: 8.6 mg/dL — ABNORMAL LOW (ref 8.9–10.3)
Chloride: 102 mmol/L (ref 98–111)
Creatinine, Ser: 0.55 mg/dL (ref 0.44–1.00)
GFR, Estimated: >60 mL/min (ref >60)
Glucose, Bld: 100 mg/dL — ABNORMAL HIGH (ref 70–99)
Potassium: 4 mmol/L (ref 3.5–5.1)
Sodium: 137 mmol/L (ref 135–145)

## 2023-03-07 MED ORDER — SODIUM CHLORIDE 0.9% FLUSH
10.0000 mL | INTRAVENOUS | Status: DC | PRN
  Administered 2023-03-14: 10 mL

## 2023-03-07 NOTE — Progress Notes (Signed)
PHYSICAL THERAPY  Pt resting this afternoon, did not disturb.  Pt did work with Occupational Therapy earlier.  Pt progressing and plans to return home with help from her sister who plans to stay with her.  Felecia Shelling  PTA Acute  Rehabilitation Services Office M-F          [phone]

## 2023-03-07 NOTE — Progress Notes (Signed)
SATURATION QUALIFICATIONS: (This note is used to comply with regulatory documentation for home oxygen)  Patient Saturations on Room Air at Rest = 87%  Patient Saturations on Room Air while Ambulating = 84%  Patient Saturations on 2-4 Liters of oxygen while Ambulating = 92%  Please briefly explain why patient needs home oxygen: Patient unable to maintain adequate oxygenation on room air while ambulating

## 2023-03-07 NOTE — Evaluation (Signed)
Occupational Therapy Evaluation Patient Details Name: Hayley Wu MRN: [id number] DOB: 1950/11/01 Today's Date: 03/07/2023   History of Present Illness Pt is 72 yo female admitted on 03/05/23 with acute respiratory failure and found to have new bil pulmonary infiltrates.  Pt with bil acute DVT started on Eliquis and subacute L PE.  Pt with hx including but not limited to stage IV adenocarcinoma lung, hepatic and bony metastases that required T7 laminectomy due to spinal cord compression 09/2022   Clinical Impression   Patient is currently requiring assistance with ADLs including up to moderate assist with Lower body ADLs, up to minimal assist with Upper body ADLs,  as well as  supervision with bed mobility and Minimal assist with functional transfers to toilet with RW, tolerating limited ambulation distances.  Current level of function is below patient's typical baseline.  During this evaluation, patient was limited by generalized weakness, impaired activity tolerance with need of supplemental O2 and desaturating on 2L with light standing grooming as low as 86%, as well as impaired standing balance, all of which has the potential to impact patient's safety and independence during functional mobility, as well as performance for ADLs.    Patient lives alone, but plans to live with sister after discharge who is able to provide some level supervision and assistance, but will need to confirm.  Patient demonstrates good rehab potential, and should benefit from continued skilled occupational therapy services while in acute care to maximize safety, independence and quality of life at home.  Continued occupational therapy services are recommended.         If plan is discharge home, recommend the following: A little help with walking and/or transfers;A lot of help with bathing/dressing/bathroom;Assist for transportation;Assistance with cooking/housework    Functional Status Assessment  Patient has  had a recent decline in their functional status and demonstrates the ability to make significant improvements in function in a reasonable and predictable amount of time.  Equipment Recommendations  Other (comment) (Long handled adaptive equipment for energy conservation)    Recommendations for Other Services       Precautions / Restrictions Precautions Precautions: Fall Precaution Comments: monitor sats Restrictions Weight Bearing Restrictions: No      Mobility Bed Mobility Overal bed mobility: Needs Assistance Bed Mobility: Supine to Sit     Supine to sit: Contact guard, Supervision     General bed mobility comments: pt self able with increased effort and use of rails.    Transfers  See ADL                        Balance Overall balance assessment: Needs assistance Sitting-balance support: No upper extremity supported Sitting balance-Leahy Scale: Good     Standing balance support: Bilateral upper extremity supported, Reliant on assistive device for balance Standing balance-Leahy Scale: Poor Standing balance comment: RW and CGA/min A                           ADL either performed or assessed with clinical judgement   ADL Overall ADL's : Needs assistance/impaired Eating/Feeding: Independent   Grooming: Oral care;Standing;Contact guard assist;Cueing for compensatory techniques Grooming Details (indicate cue type and reason): Cues for rest breaks as needed. Pt would rest forearms on vanity and cues for PLB. SpO2 running 86-88% on 2L with activity. RN present. Upper Body Bathing: Minimal assistance;Sitting   Lower Body Bathing: Minimal assistance;Moderate assistance;Sit to/from stand;Sitting/lateral leans Lower Body  Bathing Details (indicate cue type and reason): May need increased assisatnce due to fatigue. Upper Body Dressing : Minimal assistance;Sitting   Lower Body Dressing: Moderate assistance;Sitting/lateral leans Lower Body Dressing  Details (indicate cue type and reason): Pt able to sit EOB and doff/don 1 sock to LLE with increased time/effort. Pt reported that her RLE is more difficulty and she would require assistance. Toilet Transfer: Minimal assistance;Contact guard assist;Rolling walker (2 wheels);Ambulation   Toileting- Clothing Manipulation and Hygiene: Minimal assistance;Moderate assistance;Sitting/lateral lean;Sit to/from stand       Functional mobility during ADLs: Contact guard assist;Minimal assistance;Rolling walker (2 wheels);Cueing for sequencing       Vision Baseline Vision/History:  (contacts. Not wearing) Ability to See in Adequate Light: 0 Adequate Patient Visual Report: No change from baseline       Perception         Praxis         Pertinent Vitals/Pain Pain Assessment Pain Assessment: No/denies pain     Extremity/Trunk Assessment Upper Extremity Assessment Upper Extremity Assessment: Generalized weakness (Intact ROM and functional MMT but very quick to fatigue)   Lower Extremity Assessment Lower Extremity Assessment: Defer to PT evaluation;RLE deficits/detail;LLE deficits/detail RLE Deficits / Details: ROM WFL; MMT: ankle dorsiflexion 4-/5 (does have AFO she wears for ambulating during therapy), knee ext 5/5, hip flex 5/5 LLE Deficits / Details: ROM WFL; MMT: 5/5 throughout   Cervical / Trunk Assessment Cervical / Trunk Assessment: Kyphotic   Communication Communication Communication: No apparent difficulties   Cognition Arousal: Alert Behavior During Therapy: WFL for tasks assessed/performed Overall Cognitive Status: Within Functional Limits for tasks assessed                                 General Comments: AxO x 3 pleasant and motivated.  Pt lives home alone. "I don't think I can go home right now"     General Comments       Exercises     Shoulder Instructions      Home Living Family/patient expects to be discharged to:: Private residence Living  Arrangements: Alone Available Help at Discharge: Family;Available PRN/intermittently Type of Home: House Home Access: Level entry     Home Layout: One level     Bathroom Shower/Tub: Chief Strategy Officer: Handicapped height     Home Equipment: Tub bench;Rolling Walker (2 wheels);Wheelchair - manual;Hand held shower head;Lift chair;BSC/3in1   Additional Comments: lives alone plans to go to sister's house at discharge      Prior Functioning/Environment Prior Level of Function : Needs assist       Physical Assist : Mobility (physical);ADLs (physical)   ADLs (physical): IADLs;Bathing Mobility Comments: Pt was going to outpt PT about twice per week and working on walking more with RW.  SHe was using a w/c in home alone due to fall risk , but if daughter there would walk some. ADLs Comments: Independent with dressing and toielting; daughter assisted in shower but then pt able to shower.  Daughter assist with IADLs - pt able to fix light meals (microwave, sandwiches, etc)        OT Problem List: Cardiopulmonary status limiting activity;Impaired balance (sitting and/or standing);Decreased activity tolerance;Decreased knowledge of use of DME or AE;Decreased strength      OT Treatment/Interventions: Self-care/ADL training;Therapeutic activities;Therapeutic exercise;Patient/family education;Energy conservation;DME and/or AE instruction;Balance training    OT Goals(Current goals can be found in the care plan section) Acute  Rehab OT Goals Patient Stated Goal: "I know I can't go home. I'm too weak." OT Goal Formulation: With patient Time For Goal Achievement: 03/21/23 Potential to Achieve Goals: Good ADL Goals Pt Will Perform Grooming: standing;with supervision (With SpO2 >92%) Pt Will Perform Lower Body Dressing: with adaptive equipment;with set-up;with supervision;sitting/lateral leans;sit to/from stand Pt Will Transfer to Toilet: ambulating;with modified  independence Pt Will Perform Toileting - Clothing Manipulation and hygiene: with adaptive equipment;sitting/lateral leans;sit to/from stand;with modified independence Additional ADL Goal #1: Pt will complete any 2 full ADLs with RPE no greater than 6/10 to demonstrate improved activity tolerance needed for self care. Additional ADL Goal #2: Patient will identify at least 3 energy conservation strategies to employ at home in order to maximize function and quality of life and decrease caregiver burden while preventing exacerbation of symptoms and rehospitalization.  OT Frequency: Min 1X/week    Co-evaluation              AM-PAC OT "6 Clicks" Daily Activity     Outcome Measure Help from another person eating meals?: None Help from another person taking care of personal grooming?: A Little Help from another person toileting, which includes using toliet, bedpan, or urinal?: A Little Help from another person bathing (including washing, rinsing, drying)?: A Lot Help from another person to put on and taking off regular upper body clothing?: A Little Help from another person to put on and taking off regular lower body clothing?: A Lot 6 Click Score: 17   End of Session Equipment Utilized During Treatment: Gait belt;Rolling walker (2 wheels);Oxygen Nurse Communication: Mobility status;Other (comment) (RN assisting throughout session)  Activity Tolerance: Patient limited by fatigue Patient left: in chair;with call bell/phone within reach;with nursing/sitter in room  OT Visit Diagnosis: Unsteadiness on feet (R26.81);Muscle weakness (generalized) (M62.81)                Time: 6295-2841 OT Time Calculation (min): 24 min Charges:  OT General Charges $OT Visit: 1 Visit OT Evaluation $OT Eval Low Complexity: 1 Low OT Treatments $Self Care/Home Management : 8-22 mins  Victorino Dike, OT Acute Rehab Services Office: [phone] 03/07/2023  Theodoro Clock 03/07/2023, 1:31 PM

## 2023-03-07 NOTE — Progress Notes (Signed)
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 Patient also having cough occasionally productive, mild wheezing.  She has been prescribed mirtazapine recently for low appetite. In the ED: Hypoxic 50% on room air placed on nonrebreather.  Labs showed hypokalemia stable renal function albumin 2.8 Pro-Cal less than 0.1 lactic acid 0.9 BNP 152 CBC with hemoglobin 10.8 influenza COVID-19 screen negative, UA with WBC more than 50 large leukocytes. chest x-ray>> bilateral opacities right more than left concerning for pneumonia. CTA>> occluded left lower lobe pulmonary artery branch since 7/14, stable collapse of T7 with paraspinal soft tissue density and acute kyphosis, interval change of CHF, cardiomegaly interstitial pulmonary edema minimal right pleural effusion known primary lung carcinoma 2.9 cm right lower lobe stable and large lymph nodes, stable left humeral head sclerotic metastasis or infarct, COPD. Patient was wheezing and having rales on exam.  Placed on empiric antibiotic and admitted PCCM was consulted due to worsening hypoxia, echo obtained shows EF 55 to 60% G1 DD normal RV systolic function mitral valve normal aortic valve tricuspid. 9/22 Started on a steroid trial and will continue with empiric antibiotics.  Echocardiogram normal LV RV function.  Assessment & Plan:   Principal Problem:   Acute respiratory failure with hypoxia (HCC) Active Problems:   Hypokalemia   Hypertension   Hypothyroidism   Adenocarcinoma of right lung, stage 4 (HCC)   Constipation    Normocytic anemia   Moderate protein malnutrition (HCC)   Occlusion of left pulmonary artery (HCC)   Cardiomegaly   Pleural effusion due to CHF (congestive heart failure) (HCC)  Acute respiratory distress with acute hypoxic respiratory failure secondary to multifocal bilateral bronchopneumonia/pulmonary infiltrates Adenocarcinoma of right lung stage IV: Imaging shows bony spinal mets pulmonary nodules and lymphadenopathy along with bilateral multifocal opacities Differentials:pneumonia, opportunistic infections, pneumonitis due to either radiation or immunotherapy.BNP was up however echo with normal LV RV function got few doses of IV Lasix initially. PCCM managing appreciate input doing high-dose steroid trial with 125 mg x 3 days then to 60 mg daily.  Patient on prednisone 60 mg p.o. daily per PCCM recommendation, they also recommend that patient will eventually need prednisone for 3 weeks.  Patient has been weaned down to 2 L high flow oxygen today, much improved compared to yesterday.  Patient encouraged to use incentive spirometry.  Patient now appears to be stable for discharge.  She says that she is typically wheelchair-bound since she had a back surgery in April.  She was assessed by PT OT who recommended SNF.  TOC has been consulted. Continue supplemental oxygen bronchodilators, anticoagulant Eliquis.  Since procalcitonin negative-antibiotic  stopped 9/24 Dr Arbutus Ped aware of admission and agree w/ above management> per PCCM depending upon workup may need to consider change from her current immunotherapy.   Cardiomegaly: But TTE normal.BNP elevated some 162>264   Anemia Mld drop likley dilutional stable and up now  Acute bilateral LE DVT left common femoral and left proximal profunda vein Newly identified subacute left pulmonary embolism: Ct Occluded LLL pulm artery was since July.As planned per oncology-started on Eliquis therapeutic dose    Hypertension: BP  well-controlled, meds on  hold   Hypothyroidism: Continue levothyroxine   Hypokalemia: Resolved   Constipation: Continue stool softener, last BM 9/19   Protein malnutrition moderate: Augment diet, continue Remeron at bedtime Nutrition Status: Goals of care: Previous hospitalist had discussed CODE STATUS with the patient 9/22 and she wished to be DNR no CPR or intubation, patient's 2 daughters at the bedside In agreement.  DVT prophylaxis: Eliquis   Code Status: Limited: Do not attempt resuscitation (DNR) -DNR-LIMITED -Do Not Intubate/DNI   Family Communication: Sister present at bedside.  Plan of care discussed with patient in length and he/she verbalized understanding and agreed with it.  Spoke to her daughter over the phone.  Status is: Inpatient Remains inpatient appropriate because: Medically stable now.  Waiting for SNF placement.   Estimated body mass index is 26.09 kg/m as calculated from the following:   Height as of this encounter: 5\' 3"  (1.6 m).   Weight as of this encounter: 66.8 kg.    Nutritional Assessment: Body mass index is 26.09 kg/m.Marland Kitchen Seen by dietician.  I agree with the assessment and plan as outlined below: Nutrition Status: Nutrition Problem: Moderate Malnutrition Etiology: chronic illness (stage IV adenocarcinoma of lung) Signs/Symptoms: mild fat depletion, mild muscle depletion Interventions: Refer to RD note for recommendations, Ensure Enlive (each supplement provides 350kcal and 20 grams of protein)  . Skin Assessment: I have examined the patient's skin and I agree with the wound assessment as performed by the wound care RN as outlined below:    Consultants:  Oncology-signed off Pulmonology  Procedures:  As above  Antimicrobials:  Anti-infectives (From admission, onward)    Start     Dose/Rate Route Frequency Ordered Stop   03/01/23 1015  azithromycin (ZITHROMAX) 500 mg in sodium chloride 0.9 % 250 mL IVPB  Status:  Discontinued        500 mg 250 mL/hr over  60 Minutes Intravenous Every 24 hours 03/01/23 1009 03/04/23 0856   03/01/23 1015  cefTRIAXone (ROCEPHIN) 1 g in sodium chloride 0.9 % 100 mL IVPB  Status:  Discontinued        1 g 200 mL/hr over 30 Minutes Intravenous Every 24 hours 03/01/23 1009 03/04/23 0856         Subjective: Patient seen and examined.  Breathing is improving.  She feels comfortable discharging but prefers to go to SNF.  Her daughter is in agreement with her.  Objective: Vitals:   03/07/23 0500 03/07/23 0600 03/07/23 0822 03/07/23 0835  BP: (!) 150/71 117/68  (!) 154/76  Pulse: 69 66  (!) 102  Resp: 12 14  19   Temp:   98.4 F (36.9 C)   TempSrc:   Oral   SpO2: 95% 92%  92%  Weight:      Height:        Intake/Output Summary (Last 24 hours) at 03/07/2023 1034 Last data filed at 03/07/2023 5956 Gross per 24 hour  Intake --  Output 1000 ml  Net -1000 ml   Filed Weights   03/01/23 0844 03/01/23 2000  Weight: 67.1 kg 66.8 kg    Examination:  General exam: Appears calm and comfortable  Respiratory system: Rales in the bases bilaterally, improving.Marland Kitchen Respiratory effort normal. Cardiovascular system: S1 & S2 heard, RRR. No JVD, murmurs, rubs, gallops or clicks. No pedal edema. Gastrointestinal system: Abdomen is nondistended, soft and nontender. No organomegaly or masses felt. Normal bowel sounds heard. Central nervous system: Alert and oriented. No focal neurological deficits. Skin: No rashes,  lesions or ulcers.  Psychiatry: Judgement and insight appear normal. Mood & affect appropriate.   Data Reviewed: I have personally reviewed following labs and imaging studies  CBC: Recent Labs  Lab 03/01/23 0927 03/02/23 0455 03/03/23 0336 03/04/23 0500 03/05/23 0628 03/06/23 0617 03/07/23 1009  WBC 7.5   < > 4.6 8.2 8.0 7.4 7.6  NEUTROABS 6.3  --   --   --   --   --   --   HGB 10.8*   < > 11.4* 12.3 12.3 12.0 13.0  HCT 34.0*   < > 36.0 38.7 39.7 38.8 42.0  MCV 95.8   < > 96.5 96.3 96.6 99.5 97.4  PLT  234   < > 185 178 169 155 171   < > = values in this interval not displayed.   Basic Metabolic Panel: Recent Labs  Lab 03/01/23 0927 03/02/23 0455 03/03/23 0336 03/04/23 0500 03/05/23 0628 03/06/23 0617  NA 137 142 140 138 138 137  K 3.0* 3.8 3.3* 4.2 4.3 4.3  CL 102 107 102 102 102 103  CO2 24 26 29 26 27 29   GLUCOSE 101* 98 162* 158* 148* 104*  BUN 23 16 17 19 22 23   CREATININE 0.52 0.37* 0.46 0.48 0.49 0.42*  CALCIUM 8.1* 8.4* 8.5* 8.8* 8.6* 8.5*  MG 2.2  --   --   --   --   --    GFR: Estimated Creatinine Clearance: 60.1 mL/min (A) (by C-G formula based on SCr of 0.42 mg/dL (L)). Liver Function Tests: Recent Labs  Lab 03/01/23 0927 03/02/23 0455  AST 20 17  ALT 18 13  ALKPHOS 79 59  BILITOT 1.0 1.0  PROT 6.0* 5.8*  ALBUMIN 2.8* 3.3*   No results for input(s): "LIPASE", "AMYLASE" in the last 168 hours. No results for input(s): "AMMONIA" in the last 168 hours. Coagulation Profile: Recent Labs  Lab 03/01/23 0927  INR 1.2   Cardiac Enzymes: No results for input(s): "CKTOTAL", "CKMB", "CKMBINDEX", "TROPONINI" in the last 168 hours. BNP (last 3 results) No results for input(s): "PROBNP" in the last 8760 hours. HbA1C: No results for input(s): "HGBA1C" in the last 72 hours. CBG: Recent Labs  Lab 03/02/23 0756  GLUCAP 94   Lipid Profile: No results for input(s): "CHOL", "HDL", "LDLCALC", "TRIG", "CHOLHDL", "LDLDIRECT" in the last 72 hours. Thyroid Function Tests: No results for input(s): "TSH", "T4TOTAL", "FREET4", "T3FREE", "THYROIDAB" in the last 72 hours. Anemia Panel: No results for input(s): "VITAMINB12", "FOLATE", "FERRITIN", "TIBC", "IRON", "RETICCTPCT" in the last 72 hours. Sepsis Labs: Recent Labs  Lab 03/01/23 0927 03/01/23 0942 03/02/23 0455  PROCALCITON <0.10  --  <0.10  LATICACIDVEN  --  0.9  --     Recent Results (from the past 240 hour(s))  Resp panel by RT-PCR (RSV, Flu A&B, Covid) Anterior Nasal Swab     Status: None   Collection  Time: 03/01/23  9:27 AM   Specimen: Anterior Nasal Swab  Result Value Ref Range Status   SARS Coronavirus 2 by RT PCR NEGATIVE NEGATIVE Final    Comment: (NOTE) SARS-CoV-2 target nucleic acids are NOT DETECTED.  The SARS-CoV-2 RNA is generally detectable in upper respiratory specimens during the acute phase of infection. The lowest concentration of SARS-CoV-2 viral copies this assay can detect is 138 copies/mL. A negative result does not preclude SARS-Cov-2 infection and should not be used as the sole basis for treatment or other patient management decisions. A negative result may occur with  improper specimen  collection/handling, submission of specimen other than nasopharyngeal swab, presence of viral mutation(s) within the areas targeted by this assay, and inadequate number of viral copies(<138 copies/mL). A negative result must be combined with clinical observations, patient history, and epidemiological information. The expected result is Negative.  Fact Sheet for Patients:  BloggerCourse.com  Fact Sheet for Healthcare Providers:  SeriousBroker.it  This test is no t yet approved or cleared by the [place] FDA and  has been authorized for detection and/or diagnosis of SARS-CoV-2 by FDA under an Emergency Use Authorization (EUA). This EUA will remain  in effect (meaning this test can be used) for the duration of the COVID-19 declaration under Section 564(b)(1) of the Act, 21 U.S.C.section 360bbb-3(b)(1), unless the authorization is terminated  or revoked sooner.       Influenza A by PCR NEGATIVE NEGATIVE Final   Influenza B by PCR NEGATIVE NEGATIVE Final    Comment: (NOTE) The Xpert Xpress SARS-CoV-2/FLU/RSV plus assay is intended as an aid in the diagnosis of influenza from Nasopharyngeal swab specimens and should not be used as a sole basis for treatment. Nasal washings and aspirates are unacceptable for Xpert Xpress  SARS-CoV-2/FLU/RSV testing.  Fact Sheet for Patients: BloggerCourse.com  Fact Sheet for Healthcare Providers: SeriousBroker.it  This test is not yet approved or cleared by the [place] FDA and has been authorized for detection and/or diagnosis of SARS-CoV-2 by FDA under an Emergency Use Authorization (EUA). This EUA will remain in effect (meaning this test can be used) for the duration of the COVID-19 declaration under Section 564(b)(1) of the Act, 21 U.S.C. section 360bbb-3(b)(1), unless the authorization is terminated or revoked.     Resp Syncytial Virus by PCR NEGATIVE NEGATIVE Final    Comment: (NOTE) Fact Sheet for Patients: BloggerCourse.com  Fact Sheet for Healthcare Providers: SeriousBroker.it  This test is not yet approved or cleared by the [place] FDA and has been authorized for detection and/or diagnosis of SARS-CoV-2 by FDA under an Emergency Use Authorization (EUA). This EUA will remain in effect (meaning this test can be used) for the duration of the COVID-19 declaration under Section 564(b)(1) of the Act, 21 U.S.C. section 360bbb-3(b)(1), unless the authorization is terminated or revoked.  Performed at [facility], [address], [place], [place] [address]   Blood Culture (routine x 2)     Status: None   Collection Time: 03/01/23  9:27 AM   Specimen: BLOOD RIGHT ARM  Result Value Ref Range Status   Specimen Description   Final    BLOOD RIGHT ARM Performed at [facility], [address] [place], [place] [address]    Special Requests   Final    BOTTLES DRAWN AEROBIC AND ANAEROBIC Blood Culture adequate volume Performed at [facility], [address], [place], [place] [address]    Culture   Final    NO GROWTH 5 DAYS Performed at [facility], [address] [place], [place]  [address]    Report Status 03/06/2023 FINAL  Final  Blood Culture (routine x 2)     Status: None (Preliminary result)   Collection Time: 03/01/23  9:49 AM   Specimen: BLOOD  Result Value Ref Range Status   Specimen Description   Final    BLOOD LEFT ANTECUBITAL Performed at [facility], [address], [place], [place] [address]    Special Requests   Final    BOTTLES DRAWN AEROBIC AND ANAEROBIC Blood Culture adequate  volume Performed at [facility], [address] [place], [place] [address]    Culture  Setup Time   Final    GRAM POSITIVE RODS ANAEROBIC BOTTLE ONLY CRITICAL RESULT CALLED TO, READ BACK BY AND VERIFIED WITH: L POINDEXTER,[email or web address]  03/06/23 MK    Culture   Final    CULTURE REINCUBATED FOR BETTER GROWTH Performed at [facility], [address] [place], [place] [address]    Report Status PENDING  Incomplete  Urine Culture (for pregnant, neutropenic or urologic patients or patients with an indwelling urinary catheter)     Status: Abnormal   Collection Time: 03/01/23 11:58 AM   Specimen: Urine, Clean Catch  Result Value Ref Range Status   Specimen Description   Final    URINE, CLEAN CATCH Performed at [facility], [address] [place], [place] [address]    Special Requests   Final    NONE Performed at [facility], [address] [place], [place] [address]    Culture 60,000 COLONIES/mL ESCHERICHIA COLI (A)  Final   Report Status 03/05/2023 FINAL  Final   Organism ID, Bacteria ESCHERICHIA COLI (A)  Final      Susceptibility   Escherichia coli - MIC*    AMPICILLIN 8 SENSITIVE Sensitive     CEFAZOLIN <=4 SENSITIVE Sensitive     CEFEPIME <=0.12 SENSITIVE Sensitive     CEFTRIAXONE <=0.25 SENSITIVE Sensitive     CIPROFLOXACIN <=0.25 SENSITIVE Sensitive     GENTAMICIN <=1 SENSITIVE Sensitive     IMIPENEM 0.5 SENSITIVE Sensitive     NITROFURANTOIN <=16 SENSITIVE Sensitive     TRIMETH/SULFA  <=20 SENSITIVE Sensitive     AMPICILLIN/SULBACTAM 4 SENSITIVE Sensitive     PIP/TAZO <=4 SENSITIVE Sensitive     * 60,000 COLONIES/mL ESCHERICHIA COLI  MRSA Next Gen by PCR, Nasal     Status: None   Collection Time: 03/01/23  9:50 PM   Specimen: Nasal Mucosa; Nasal Swab  Result Value Ref Range Status   MRSA by PCR Next Gen NOT DETECTED NOT DETECTED Final    Comment: (NOTE) The GeneXpert MRSA Assay (FDA approved for NASAL specimens only), is one component of a comprehensive MRSA colonization surveillance program. It is not intended to diagnose MRSA infection nor to guide or monitor treatment for MRSA infections. Test performance is not FDA approved in patients less than 50 years old. Performed at [facility], [address], [place], [place] [address]      Radiology Studies: [facility] Chest Port 1 View  Result Date: 03/06/2023 CLINICAL DATA:  Acute respiratory failure. EXAM: PORTABLE CHEST 1 VIEW COMPARISON:  March 02, 2023 FINDINGS: Stable cardiomediastinal silhouette. Right internal jugular Port-A-Cath is unchanged. Decreased bilateral lung opacities are noted suggesting improving pneumonia or edema. Bony thorax is unremarkable. IMPRESSION: Decreased bilateral lung opacities are noted consistent with improving pneumonia or edema. Electronically Signed   By: Lupita Raider M.D.   On: 03/06/2023 09:15    Scheduled Meds:  apixaban  10 mg Oral BID   Followed by   Melene Muller ON 03/08/2023] apixaban  5 mg Oral BID   Chlorhexidine Gluconate Cloth  6 each Topical QHS   feeding supplement  237 mL Oral BID BM   folic acid  1 mg Oral Daily   levothyroxine  100 mcg Oral Q0600   methylPREDNISolone (SOLU-MEDROL) injection  60 mg Intravenous Q24H   mirtazapine  30 mg Oral QHS   senna  1 tablet  Oral Daily   Continuous Infusions:   LOS: 6 days   Hughie Closs, MD [facility]  03/07/2023, 10:34 AM   *Please note that this is a verbal dictation therefore any spelling or  grammatical errors are due to the "Dragon Medical One" system interpretation.  Please page via Amion and do not message via secure chat for urgent patient care matters. Secure chat can be used for non urgent patient care matters.  How to contact the [facility] Attending or Consulting provider 7A - 7P or covering provider during after hours 7P -7A, for this patient?  Check the care team in [facility] and look for a) attending/consulting TR[facility] provider listed and b) the [facility] team listed. Page or secure chat 7A-7P. Log into [email or web address] and use [place]'s universal password to access. If you do not have the password, please contact the hospital operator. Locate the [facility] provider you are looking for under [facility] and page to a number that you can be directly reached. If you still have difficulty reaching the provider, please page the [facility] (Director on Call) for the Hospitalists listed on amion for assistance.

## 2023-03-07 NOTE — Plan of Care (Signed)
  Problem: Education: Goal: Knowledge of the prescribed therapeutic regimen will improve Outcome: Progressing Goal: Understanding of discharge needs will improve Outcome: Progressing   Problem: Activity: Goal: Ability to avoid complications of mobility impairment will improve Outcome: Progressing Goal: Ability to tolerate increased activity will improve Outcome: Progressing Goal: Will remain free from falls Outcome: Progressing   Problem: Bowel/Gastric: Goal: Gastrointestinal status for postoperative course will improve Outcome: Progressing   Problem: Clinical Measurements: Goal: Ability to maintain clinical measurements within normal limits will improve Outcome: Progressing Goal: Postoperative complications will be avoided or minimized Outcome: Progressing Goal: Diagnostic test results will improve Outcome: Progressing   Problem: Pain Management: Goal: Pain level will decrease Outcome: Progressing   Problem: Skin Integrity: Goal: Will show signs of wound healing Outcome: Progressing   Problem: Health Behavior/Discharge Planning: Goal: Identification of resources available to assist in meeting health care needs will improve Outcome: Progressing   Problem: Bladder/Genitourinary: Goal: Urinary functional status for postoperative course will improve Outcome: Progressing   Problem: Education: Goal: Knowledge of General Education information will improve Description: Including pain rating scale, medication(s)/side effects and non-pharmacologic comfort measures Outcome: Progressing   Problem: Health Behavior/Discharge Planning: Goal: Ability to manage health-related needs will improve Outcome: Progressing   Problem: Clinical Measurements: Goal: Ability to maintain clinical measurements within normal limits will improve Outcome: Progressing Goal: Will remain free from infection Outcome: Progressing Goal: Diagnostic test results will improve Outcome:  Progressing Goal: Respiratory complications will improve Outcome: Progressing Goal: Cardiovascular complication will be avoided Outcome: Progressing   Problem: Activity: Goal: Risk for activity intolerance will decrease Outcome: Progressing   Problem: Nutrition: Goal: Adequate nutrition will be maintained Outcome: Progressing   Problem: Coping: Goal: Level of anxiety will decrease Outcome: Progressing   Problem: Elimination: Goal: Will not experience complications related to bowel motility Outcome: Progressing Goal: Will not experience complications related to urinary retention Outcome: Progressing   Problem: Safety: Goal: Ability to remain free from injury will improve Outcome: Progressing   Problem: Skin Integrity: Goal: Risk for impaired skin integrity will decrease Outcome: Progressing

## 2023-03-08 DIAGNOSIS — J9601 Acute respiratory failure with hypoxia: Secondary | ICD-10-CM | POA: Diagnosis not present

## 2023-03-08 LAB — CULTURE, BLOOD (ROUTINE X 2)

## 2023-03-08 NOTE — Progress Notes (Signed)
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 Patient also having cough occasionally productive, mild wheezing.  She has been prescribed mirtazapine recently for low appetite. In the ED: Hypoxic 50% on room air placed on nonrebreather.  Labs showed hypokalemia stable renal function albumin 2.8 Pro-Cal less than 0.1 lactic acid 0.9 BNP 152 CBC with hemoglobin 10.8 influenza COVID-19 screen negative, UA with WBC more than 50 large leukocytes. chest x-ray>> bilateral opacities right more than left concerning for pneumonia. CTA>> occluded left lower lobe pulmonary artery branch since 7/14, stable collapse of T7 with paraspinal soft tissue density and acute kyphosis, interval change of CHF, cardiomegaly interstitial pulmonary edema minimal right pleural effusion known primary lung carcinoma 2.9 cm right lower lobe stable and large lymph nodes, stable left humeral head sclerotic metastasis or infarct, COPD. Patient was wheezing and having rales on exam.  Placed on empiric antibiotic and admitted PCCM was consulted due to worsening hypoxia, echo obtained shows EF 55 to 60% G1 DD normal RV systolic function mitral valve normal aortic valve tricuspid. 9/22 Started on a steroid trial and will continue with empiric antibiotics.  Echocardiogram normal LV RV function.  Assessment & Plan:   Principal Problem:   Acute respiratory failure with hypoxia (HCC) Active Problems:   Hypokalemia   Hypertension   Hypothyroidism   Adenocarcinoma of right lung, stage 4 (HCC)   Constipation    Normocytic anemia   Moderate protein malnutrition (HCC)   Occlusion of left pulmonary artery (HCC)   Cardiomegaly   Pleural effusion due to CHF (congestive heart failure) (HCC)  Acute respiratory distress with acute hypoxic respiratory failure secondary to multifocal bilateral bronchopneumonia/pulmonary infiltrates Adenocarcinoma of right lung stage IV: Imaging shows bony spinal mets pulmonary nodules and lymphadenopathy along with bilateral multifocal opacities Differentials:pneumonia, opportunistic infections, pneumonitis due to either radiation or immunotherapy.BNP was up however echo with normal LV RV function got few doses of IV Lasix initially. PCCM managing appreciate input doing high-dose steroid trial with 125 mg x 3 days.  Patient on Solu-Medrol 60 mg IV daily since 03/05/2023 per PCCM recommendation, they also recommend that patient will eventually need prednisone for 3 weeks.  Patient has been weaned down to 1.5 L oxygen today, much improved compared to yesterday.  Patient encouraged to use incentive spirometry.  Patient now appears to be stable for discharge.  She says that she is typically wheelchair-bound since she had a back surgery in April.  She was assessed by PT OT who recommended SNF.  TOC has been consulted. Continue supplemental oxygen bronchodilators, anticoagulant Eliquis.  Since procalcitonin negative-antibiotic  stopped 9/24 Dr Arbutus Ped aware of admission and agree w/ above management> per PCCM depending upon workup may need to consider change from her current immunotherapy.   Cardiomegaly: But TTE normal.BNP elevated some 162>264   Anemia Mld drop likley dilutional stable and up now  Acute bilateral LE DVT left common femoral and left proximal profunda vein Newly identified subacute left pulmonary embolism: Ct Occluded LLL pulm artery was since July.As planned per oncology-started on Eliquis therapeutic dose    Hypertension: BP well-controlled, meds on hold  Hypothyroidism: Continue levothyroxine   Hypokalemia: Resolved   Constipation: Continue stool softener, last BM 9/19   Protein malnutrition moderate: Augment diet, continue Remeron at bedtime Nutrition Status: Goals of care: Previous hospitalist had discussed CODE STATUS with the patient 9/22 and she wished to be DNR no CPR or intubation, patient's 2 daughters at the bedside In agreement.  DVT prophylaxis: Eliquis   Code Status: Limited: Do not attempt resuscitation (DNR) -DNR-LIMITED -Do Not Intubate/DNI   Family Communication: None present at bedside.  Plan of care discussed with patient in length and he/she verbalized understanding and agreed with it.    Status is: Inpatient Remains inpatient appropriate because: Medically stable since 03/07/2023.  TOC consulted same date as well.  Waiting to be seen by TOC.   Estimated body mass index is 26.09 kg/m as calculated from the following:   Height as of this encounter: 5\' 3"  (1.6 m).   Weight as of this encounter: 66.8 kg.    Nutritional Assessment: Body mass index is 26.09 kg/m.Marland Kitchen Seen by dietician.  I agree with the assessment and plan as outlined below: Nutrition Status: Nutrition Problem: Moderate Malnutrition Etiology: chronic illness (stage IV adenocarcinoma of lung) Signs/Symptoms: mild fat depletion, mild muscle depletion Interventions: Refer to RD note for recommendations, Ensure Enlive (each supplement provides 350kcal and 20 grams of protein)  . Skin Assessment: I have examined the patient's skin and I agree with the wound assessment as performed by the wound care RN as outlined below:    Consultants:  Oncology-signed off Pulmonology  Procedures:  As above  Antimicrobials:  Anti-infectives (From admission, onward)    Start     Dose/Rate Route Frequency Ordered Stop   03/01/23 1015  azithromycin (ZITHROMAX) 500 mg in sodium chloride 0.9 % 250 mL IVPB  Status:  Discontinued        500 mg 250 mL/hr over  60 Minutes Intravenous Every 24 hours 03/01/23 1009 03/04/23 0856   03/01/23 1015  cefTRIAXone (ROCEPHIN) 1 g in sodium chloride 0.9 % 100 mL IVPB  Status:  Discontinued        1 g 200 mL/hr over 30 Minutes Intravenous Every 24 hours 03/01/23 1009 03/04/23 0856         Subjective: Patient seen and examined.  Breathing continues to improve.  No new complaint.  Objective: Vitals:   03/07/23 1845 03/07/23 2207 03/08/23 0119 03/08/23 0510  BP: (!) 145/88 123/75 122/77 (!) 145/88  Pulse: 86 90 77 82  Resp:  18 18 18   Temp: 98.3 F (36.8 C) 98.4 F (36.9 C) 97.7 F (36.5 C) 98 F (36.7 C)  TempSrc: Oral Oral Oral Oral  SpO2: 95% 93% 95% 95%  Weight:      Height:        Intake/Output Summary (Last 24 hours) at 03/08/2023 1013 Last data filed at 03/08/2023 0824 Gross per 24 hour  Intake 360 ml  Output 1700 ml  Net -1340 ml   Filed Weights   03/01/23 0844 03/01/23 2000  Weight: 67.1 kg 66.8 kg    Examination:  General exam: Appears calm and comfortable  Respiratory system: Clear to auscultation. Respiratory effort normal. Cardiovascular system: S1 & S2 heard, RRR. No JVD, murmurs, rubs, gallops or clicks. No pedal edema. Gastrointestinal system: Abdomen is nondistended, soft and nontender. No organomegaly or masses felt. Normal bowel sounds heard. Central nervous system: Alert and oriented. No focal neurological deficits. Extremities: Symmetric 5 x 5 power. Skin: No rashes, lesions or ulcers.  Psychiatry:  Judgement and insight appear normal. Mood & affect appropriate.    Data Reviewed: I have personally reviewed following labs and imaging studies  CBC: Recent Labs  Lab 03/03/23 0336 03/04/23 0500 03/05/23 0628 03/06/23 0617 03/07/23 1009  WBC 4.6 8.2 8.0 7.4 7.6  HGB 11.4* 12.3 12.3 12.0 13.0  HCT 36.0 38.7 39.7 38.8 42.0  MCV 96.5 96.3 96.6 99.5 97.4  PLT 185 178 169 155 171   Basic Metabolic Panel: Recent Labs  Lab 03/03/23 0336 03/04/23 0500  03/05/23 0628 03/06/23 0617 03/07/23 1009  NA 140 138 138 137 137  K 3.3* 4.2 4.3 4.3 4.0  CL 102 102 102 103 102  CO2 29 26 27 29 27   GLUCOSE 162* 158* 148* 104* 100*  BUN 17 19 22 23 21   CREATININE 0.46 0.48 0.49 0.42* 0.55  CALCIUM 8.5* 8.8* 8.6* 8.5* 8.6*   GFR: Estimated Creatinine Clearance: 60.1 mL/min (by C-G formula based on SCr of 0.55 mg/dL). Liver Function Tests: Recent Labs  Lab 03/02/23 0455  AST 17  ALT 13  ALKPHOS 59  BILITOT 1.0  PROT 5.8*  ALBUMIN 3.3*   No results for input(s): "LIPASE", "AMYLASE" in the last 168 hours. No results for input(s): "AMMONIA" in the last 168 hours. Coagulation Profile: No results for input(s): "INR", "PROTIME" in the last 168 hours.  Cardiac Enzymes: No results for input(s): "CKTOTAL", "CKMB", "CKMBINDEX", "TROPONINI" in the last 168 hours. BNP (last 3 results) No results for input(s): "PROBNP" in the last 8760 hours. HbA1C: No results for input(s): "HGBA1C" in the last 72 hours. CBG: Recent Labs  Lab 03/02/23 0756  GLUCAP 94   Lipid Profile: No results for input(s): "CHOL", "HDL", "LDLCALC", "TRIG", "CHOLHDL", "LDLDIRECT" in the last 72 hours. Thyroid Function Tests: No results for input(s): "TSH", "T4TOTAL", "FREET4", "T3FREE", "THYROIDAB" in the last 72 hours. Anemia Panel: No results for input(s): "VITAMINB12", "FOLATE", "FERRITIN", "TIBC", "IRON", "RETICCTPCT" in the last 72 hours. Sepsis Labs: Recent Labs  Lab 03/02/23 0455  PROCALCITON <0.10    Recent Results (from the past 240 hour(s))  Resp panel by RT-PCR (RSV, Flu A&B, Covid) Anterior Nasal Swab     Status: None   Collection Time: 03/01/23  9:27 AM   Specimen: Anterior Nasal Swab  Result Value Ref Range Status   SARS Coronavirus 2 by RT PCR NEGATIVE NEGATIVE Final    Comment: (NOTE) SARS-CoV-2 target nucleic acids are NOT DETECTED.  The SARS-CoV-2 RNA is generally detectable in upper respiratory specimens during the acute phase of infection.  The lowest concentration of SARS-CoV-2 viral copies this assay can detect is 138 copies/mL. A negative result does not preclude SARS-Cov-2 infection and should not be used as the sole basis for treatment or other patient management decisions. A negative result may occur with  improper specimen collection/handling, submission of specimen other than nasopharyngeal swab, presence of viral mutation(s) within the areas targeted by this assay, and inadequate number of viral copies(<138 copies/mL). A negative result must be combined with clinical observations, patient history, and epidemiological information. The expected result is Negative.  Fact Sheet for Patients:  BloggerCourse.com  Fact Sheet for Healthcare Providers:  SeriousBroker.it  This test is no t yet approved or cleared by the [place] FDA and  has been authorized for detection and/or diagnosis of SARS-CoV-2 by FDA under an Emergency Use Authorization (EUA). This EUA will remain  in effect (meaning this test can be used) for the duration of the COVID-19 declaration under Section 564(b)(1) of the  Act, 21 U.S.C.section 360bbb-3(b)(1), unless the authorization is terminated  or revoked sooner.       Influenza A by PCR NEGATIVE NEGATIVE Final   Influenza B by PCR NEGATIVE NEGATIVE Final    Comment: (NOTE) The Xpert Xpress SARS-CoV-2/FLU/RSV plus assay is intended as an aid in the diagnosis of influenza from Nasopharyngeal swab specimens and should not be used as a sole basis for treatment. Nasal washings and aspirates are unacceptable for Xpert Xpress SARS-CoV-2/FLU/RSV testing.  Fact Sheet for Patients: BloggerCourse.com  Fact Sheet for Healthcare Providers: SeriousBroker.it  This test is not yet approved or cleared by the [place] FDA and has been authorized for detection and/or diagnosis of SARS-CoV-2 by FDA  under an Emergency Use Authorization (EUA). This EUA will remain in effect (meaning this test can be used) for the duration of the COVID-19 declaration under Section 564(b)(1) of the Act, 21 U.S.C. section 360bbb-3(b)(1), unless the authorization is terminated or revoked.     Resp Syncytial Virus by PCR NEGATIVE NEGATIVE Final    Comment: (NOTE) Fact Sheet for Patients: BloggerCourse.com  Fact Sheet for Healthcare Providers: SeriousBroker.it  This test is not yet approved or cleared by the [place] FDA and has been authorized for detection and/or diagnosis of SARS-CoV-2 by FDA under an Emergency Use Authorization (EUA). This EUA will remain in effect (meaning this test can be used) for the duration of the COVID-19 declaration under Section 564(b)(1) of the Act, 21 U.S.C. section 360bbb-3(b)(1), unless the authorization is terminated or revoked.  Performed at [facility], [address], [place], [place] [address]   Blood Culture (routine x 2)     Status: None   Collection Time: 03/01/23  9:27 AM   Specimen: BLOOD RIGHT ARM  Result Value Ref Range Status   Specimen Description   Final    BLOOD RIGHT ARM Performed at [facility], [address], [place], [place] [address]    Special Requests   Final    BOTTLES DRAWN AEROBIC AND ANAEROBIC Blood Culture adequate volume Performed at [facility], [address], [place], [place] [address]    Culture   Final    NO GROWTH 5 DAYS Performed at [facility], [address] [place], [place] [address]    Report Status 03/06/2023 FINAL  Final  Blood Culture (routine x 2)     Status: None (Preliminary result)   Collection Time: 03/01/23  9:49 AM   Specimen: BLOOD  Result Value Ref Range Status   Specimen Description   Final    BLOOD LEFT ANTECUBITAL Performed at [facility], [address]  [place], [place] [address]    Special Requests   Final    BOTTLES DRAWN AEROBIC AND ANAEROBIC Blood Culture adequate volume Performed at [facility], [address] [place], [place] [address]    Culture  Setup Time   Final    GRAM POSITIVE RODS ANAEROBIC BOTTLE ONLY CRITICAL RESULT CALLED TO, READ BACK BY AND VERIFIED WITH: L POINDEXTER,PHARMD@[id number]  03/06/23 MK    Culture   Final    CULTURE REINCUBATED FOR BETTER GROWTH Performed at [facility], [address], [place], [place] [address]    Report Status PENDING  Incomplete  Urine Culture (for pregnant, neutropenic or urologic patients or patients with an indwelling urinary catheter)     Status: Abnormal   Collection Time: 03/01/23 11:58 AM   Specimen: Urine, Clean Catch  Result Value Ref Range Status   Specimen Description   Final    URINE, CLEAN CATCH Performed at [facility], [address] [place], [place] [address]    Special Requests   Final    NONE Performed at [facility], [address] [place], [place] [address]    Culture 60,000 COLONIES/mL ESCHERICHIA COLI (A)  Final   Report Status 03/05/2023 FINAL  Final   Organism ID, Bacteria ESCHERICHIA COLI (A)  Final      Susceptibility   Escherichia coli - MIC*    AMPICILLIN 8 SENSITIVE Sensitive     CEFAZOLIN <=4 SENSITIVE Sensitive     CEFEPIME <=0.12 SENSITIVE Sensitive     CEFTRIAXONE <=0.25 SENSITIVE Sensitive     CIPROFLOXACIN <=0.25 SENSITIVE Sensitive     GENTAMICIN <=1 SENSITIVE Sensitive     IMIPENEM 0.5 SENSITIVE Sensitive     NITROFURANTOIN <=16 SENSITIVE Sensitive     TRIMETH/SULFA <=20 SENSITIVE Sensitive     AMPICILLIN/SULBACTAM 4 SENSITIVE Sensitive     PIP/TAZO <=4 SENSITIVE Sensitive     * 60,000 COLONIES/mL ESCHERICHIA COLI  MRSA Next Gen by PCR, Nasal     Status: None   Collection Time: 03/01/23  9:50 PM   Specimen: Nasal Mucosa; Nasal Swab  Result Value Ref Range Status   MRSA by  PCR Next Gen NOT DETECTED NOT DETECTED Final    Comment: (NOTE) The GeneXpert MRSA Assay (FDA approved for NASAL specimens only), is one component of a comprehensive MRSA colonization surveillance program. It is not intended to diagnose MRSA infection nor to guide or monitor treatment for MRSA infections. Test performance is not FDA approved in patients less than 74 years old. Performed at [facility], [address] [place], [place] [address]      Radiology Studies: No results found.  Scheduled Meds:  apixaban  5 mg Oral BID   Chlorhexidine Gluconate Cloth  6 each Topical QHS   feeding supplement  237 mL Oral BID BM   folic acid  1 mg Oral Daily   levothyroxine  100 mcg Oral Q0600   methylPREDNISolone (SOLU-MEDROL) injection  60 mg Intravenous Q24H   mirtazapine  30 mg Oral QHS   senna  1 tablet Oral Daily   Continuous Infusions:   LOS: 7 days   Hughie Closs, MD [facility]  03/08/2023, 10:13 AM   *Please note that this is a verbal dictation therefore any spelling or grammatical errors are due to the "Dragon Medical One" system interpretation.  Please page via Amion and do not message via secure chat for urgent patient care matters. Secure chat can be used for non urgent patient care matters.  How to contact the [facility] Attending or Consulting provider 7A - 7P or covering provider during after hours 7P -7A, for this patient?  Check the care team in [facility] and look for a) attending/consulting TRH provider listed and b) the [facility] team listed. Page or secure chat 7A-7P. Log into [email or web address] and use [place]'s universal password to access. If you do not have the password, please contact the hospital operator. Locate the [facility] provider you are looking for under [facility] and page to a number that you can be directly reached. If you still have difficulty reaching the provider, please page the [facility] (Director on Call) for the Hospitalists listed on  amion for assistance.

## 2023-03-08 NOTE — Progress Notes (Signed)
Physical Therapy Treatment Patient Details Name: Hayley Wu MRN: [id number] DOB: [birth year] Today's Date: 03/08/2023   History of Present Illness Pt is 72 yo female admitted on 03/05/23 with acute respiratory failure and found to have new bil pulmonary infiltrates.  Pt with bil acute DVT started on Eliquis and subacute L PE.  Pt with hx including but not limited to stage IV adenocarcinoma lung, hepatic and bony metastases that required T7 laminectomy due to spinal cord compression 09/2022    PT Comments  The patient  is eager to ambulate. Patient resting  in bed on 1.5.LPM, SPO2 90%.  Patient  ambulated 38' then seated rest, then 30' more, patient placed on 2 L as 1.5 not on tank.  SPO2  drops to 86% with recovery  to 90 within 1 ".  HR 110-127  Patient reports plans are for SNF rehab.Patient will benefit from continued inpatient follow up therapy, <3 hours/day    If plan is discharge home, recommend the following: A little help with walking and/or transfers;A little help with bathing/dressing/bathroom;Assistance with cooking/housework;Help with stairs or ramp for entrance   Can travel by private vehicle        Equipment Recommendations  None recommended by PT    Recommendations for Other Services       Precautions / Restrictions Precautions Precautions: Fall Precaution Comments: monitor sats     Mobility  Bed Mobility   Bed Mobility: Supine to Sit     Supine to sit: Supervision     General bed mobility comments: self assits legs to and over bed edge    Transfers Overall transfer level: Needs assistance Equipment used: Rolling walker (2 wheels) Transfers: Sit to/from Stand Sit to Stand: Contact guard assist           General transfer comment: extra effort to rise up, pushes with UE's    Ambulation/Gait Ambulation/Gait assistance: Min assist, +2 safety/equipment Gait Distance (Feet): 40 Feet (then 30') Assistive device: Rolling walker (2 wheels) Gait  Pattern/deviations: Step-to pattern, Decreased stride length, Trunk flexed, Ataxic Gait velocity: decreased     General Gait Details: seated rest break   Stairs             Wheelchair Mobility     Tilt Bed    Modified Rankin (Stroke Patients Only)       Balance Overall balance assessment: Needs assistance Sitting-balance support: No upper extremity supported Sitting balance-Leahy Scale: Good     Standing balance support: Bilateral upper extremity supported, Reliant on assistive device for balance Standing balance-Leahy Scale: Poor                              Cognition Arousal: Alert Behavior During Therapy: WFL for tasks assessed/performed Overall Cognitive Status: Within Functional Limits for tasks assessed                                          Exercises      General Comments        Pertinent Vitals/Pain Pain Assessment Pain Assessment: No/denies pain    Home Living                          Prior Function            PT Goals (current goals can  now be found in the care plan section) Progress towards PT goals: Progressing toward goals    Frequency    Min 1X/week      PT Plan      Co-evaluation              AM-PAC PT "6 Clicks" Mobility   Outcome Measure  Help needed turning from your back to your side while in a flat bed without using bedrails?: A Little Help needed moving from lying on your back to sitting on the side of a flat bed without using bedrails?: A Little   Help needed standing up from a chair using your arms (e.g., wheelchair or bedside chair)?: A Little Help needed to walk in hospital room?: A Little Help needed climbing 3-5 steps with a railing? : Total 6 Click Score: 13    End of Session Equipment Utilized During Treatment: Gait belt Activity Tolerance: Patient tolerated treatment well Patient left: in chair;with call bell/phone within reach;with family/visitor  present;with chair alarm set Nurse Communication: Mobility status PT Visit Diagnosis: Other abnormalities of gait and mobility (R26.89)     Time: 4332-9518 PT Time Calculation (min) (ACUTE ONLY): 21 min  Charges:    $Gait Training: 8-22 mins PT General Charges $$ ACUTE PT VISIT: 1 Visit                    [place]ard Kelch PT Acute Rehabilitation Services Office [phone] Weekend pager-[phone]    Rada Hay 03/08/2023, 2:28 PM

## 2023-03-08 NOTE — Plan of Care (Signed)
  Problem: Education: Goal: Knowledge of the prescribed therapeutic regimen will improve Outcome: Progressing   Problem: Activity: Goal: Ability to avoid complications of mobility impairment will improve Outcome: Progressing Goal: Ability to tolerate increased activity will improve Outcome: Progressing Goal: Will remain free from falls Outcome: Progressing   Problem: Pain Management: Goal: Pain level will decrease Outcome: Progressing

## 2023-03-08 NOTE — TOC Progression Note (Addendum)
Transition of Care [facility]) - Progression Note    Patient Details  Name: Hayley Wu MRN: [id number] Date of Birth: November 30, 1950  [facility]) CM/SW Contact  Adrian Prows, RN Phone Number: 03/08/2023, 4:13 PM  Clinical Narrative:    [name] w/ pt in room; she placed on dtr on speaker phone; pt agrees to recc SNF; she does not have an a facility preference but would like the bed search limited to [facility]; explained SNF process to pt and dtr; they verbalize understanding; fax out to facilities; awaiting bed offers; ins auth.        Expected Discharge Plan and Services                                               Social Determinants of Health (SDOH) Interventions SDOH Screenings   Food Insecurity: No Food Insecurity (03/01/2023)  Housing: Low Risk  (03/01/2023)  Transportation Needs: No Transportation Needs (03/01/2023)  Utilities: Not At Risk (03/01/2023)  Depression (PHQ2-9): Low Risk  (10/02/2022)  Tobacco Use: High Risk (03/01/2023)    Readmission Risk Interventions    03/04/2023    6:23 PM  Readmission Risk Prevention Plan  Transportation Screening Complete  PCP or Specialist Appt within 3-5 Days Complete  HRI or Home Care Consult Complete  Social Work Consult for Recovery Care Planning/Counseling Complete  Palliative Care Screening Not Applicable  Medication Review Oceanographer) Complete

## 2023-03-08 NOTE — [place] (Signed)
[place] MEDICAID FL2 LEVEL OF CARE FORM     IDENTIFICATION  Patient Name: Hayley Wu Birthdate: January 16, 1951 Sex: female Admission Date (Current Location): 03/01/2023  [facility] and [place] Number:  Producer, television/film/video and Address:  [facility],  [address] [place], [place] [address]      Provider Number: [id number]  Attending Physician Name and Address:  Hughie Closs, MD  Relative Name and Phone Number:  Audley Hose (dtr) [phone]    Current Level of Care: Hospital Recommended Level of Care: Skilled Nursing Facility Prior Approval Number:    Date Approved/Denied:   PASRR Number: [id number]  Discharge Plan: SNF    Current Diagnoses: Patient Active Problem List   Diagnosis Date Noted   Normocytic anemia 03/01/2023   Moderate protein malnutrition (HCC) 03/01/2023   Acute respiratory failure with hypoxia (HCC) 03/01/2023   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   Cardiomegaly 03/01/2023   Pleural effusion due to CHF (congestive heart failure) (HCC) 03/01/2023   Port-A-Cath in place 12/30/2022   Encounter for antineoplastic immunotherapy 11/18/2022   Encounter for antineoplastic chemotherapy 11/18/2022   Constipation 11/05/2022   Goals of care, counseling/discussion 10/21/2022   Cancer, metastatic to bone (HCC) 10/04/2022   Adenocarcinoma of right lung, stage 4 (HCC) 10/01/2022   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 09/20/2022   Hypokalemia 09/20/2022   Lung mass 09/20/2022   Hypothyroidism 09/20/2022   Cholelithiasis 09/20/2022   Lumbar spondylosis 08/26/2022   Thoracic back pain 04/04/2022   Low back pain 04/04/2022   Hypertension 09/23/2011    Orientation RESPIRATION BLADDER Height & Weight     Self, Time, Situation, Place  O2 (1.5 L [place]) Continent Weight: 66.8 kg Height:  5\' 3"   (160 cm)  BEHAVIORAL SYMPTOMS/MOOD NEUROLOGICAL BOWEL NUTRITION STATUS      Continent Diet (regular diet)  AMBULATORY STATUS COMMUNICATION OF NEEDS Skin   Limited Assist Verbally Normal                       Personal Care Assistance Level of Assistance  Bathing, Feeding, Dressing Bathing Assistance: Limited assistance Feeding assistance: Independent Dressing Assistance: Limited assistance     Functional Limitations Info  Sight, Hearing, Speech Sight Info: Impaired (contact lenses/glasses) Hearing Info: Adequate Speech Info: Adequate    SPECIAL CARE FACTORS FREQUENCY  PT (By licensed PT), OT (By licensed OT)     PT Frequency: 5x/week OT Frequency: 5x/week            Contractures Contractures Info: Not present    Additional Factors Info  Code Status, Allergies Code Status Info: Limited: Do not attempt resuscitation (DNR) -DNR-LIMITED -Do Not Intubate/DNI If pulseless and not breathing No CPR or chest compressions.  In Pre-Arrest Conditions (Patient Is Breathing and Has A Pulse) Do not intubate. Provide all appropriate non-invasive medical interventions. Avoid ICU transfer unless indicated or required Allergies Info: Erythromycin, Amoxicillin, Latex, Prednisone           Current Medications (03/08/2023):  This is the current hospital active medication list Current Facility-Administered Medications  Medication Dose Route Frequency Provider Last Rate Last Admin   acetaminophen (TYLENOL) tablet 650 mg  650 mg Oral Q6H PRN Bobette Mo, MD       Or   acetaminophen (TYLENOL) suppository 650 mg  650 mg Rectal Q6H PRN Bobette Mo, MD  albuterol (PROVENTIL) (2.5 MG/3ML) 0.083% nebulizer solution 2.5 mg  2.5 mg Nebulization Q4H PRN Anthoney Harada, NP       apixaban (ELIQUIS) tablet 5 mg  5 mg Oral BID Cherylin Mylar, RPH       Chlorhexidine Gluconate Cloth 2 % PADS 6 each  6 each Topical QHS Bobette Mo, MD   6 each at 03/08/23 0513   feeding  supplement (ENSURE ENLIVE / ENSURE PLUS) liquid 237 mL  237 mL Oral BID BM Kc, Ramesh, MD   237 mL at 03/08/23 1542   folic acid (FOLVITE) tablet 1 mg  1 mg Oral Daily Bobette Mo, MD   1 mg at 03/08/23 0912   levothyroxine (SYNTHROID) tablet 100 mcg  100 mcg Oral Q0600 Bobette Mo, MD   100 mcg at 03/08/23 0601   methylPREDNISolone sodium succinate (SOLU-MEDROL) 125 mg/2 mL injection 60 mg  60 mg Intravenous Q24H Hughie Closs, MD   60 mg at 03/08/23 1211   mirtazapine (REMERON) tablet 30 mg  30 mg Oral QHS Bobette Mo, MD   30 mg at 03/07/23 2108   ondansetron [facility]) tablet 4 mg  4 mg Oral Q6H PRN Bobette Mo, MD       Or   ondansetron [facility]) injection 4 mg  4 mg Intravenous Q6H PRN Bobette Mo, MD       Oral care mouth rinse  15 mL Mouth Rinse PRN Bobette Mo, MD       polyvinyl alcohol (LIQUIFILM TEARS) 1.4 % ophthalmic solution 1 drop  1 drop Both Eyes PRN Kc, Dayna Barker, MD   1 drop at 03/03/23 1025   senna (SENOKOT) tablet 8.6 mg  1 tablet Oral Daily Kc, Ramesh, MD   8.6 mg at 03/05/23 0958   sodium chloride flush (NS) 0.9 % injection 10-40 mL  10-40 mL Intracatheter PRN Hughie Closs, MD         Discharge Medications: Please see discharge summary for a list of discharge medications.  Relevant Imaging Results:  Relevant Lab Results:   Additional Information SSN: [id number]  Adrian Prows, RN

## 2023-03-09 DIAGNOSIS — J9601 Acute respiratory failure with hypoxia: Secondary | ICD-10-CM | POA: Diagnosis not present

## 2023-03-09 NOTE — Plan of Care (Signed)
  Problem: Safety: Goal: Ability to remain free from injury will improve Outcome: Progressing   Problem: Skin Integrity: Goal: Risk for impaired skin integrity will decrease Outcome: Progressing   Problem: Coping: Goal: Level of anxiety will decrease Outcome: Progressing   Problem: Clinical Measurements: Goal: Ability to maintain clinical measurements within normal limits will improve Outcome: Progressing

## 2023-03-09 NOTE — Progress Notes (Signed)
SATURATION QUALIFICATIONS: (This note is used to comply with regulatory documentation for home oxygen)  Patient Saturations on RA at Rest = 89%  Patient Saturations on 1 Liter of oxygen at Rest = 92%  Patient Saturations on 1 Liter of oxygen while Ambulating = 84%  Patient Saturations on 3 Liters of oxygen while Ambulating = 92%  Please briefly explain why patient needs home oxygen:   Unable to maintain adequate blood oxygenation on room air.

## 2023-03-09 NOTE — Progress Notes (Signed)
Mobility Specialist - Progress Note   03/09/23 1144  Mobility  Activity Ambulated with assistance in hallway  Level of Assistance Contact guard assist, steadying assist  Assistive Device Front wheel walker (chair follow too)  Distance Ambulated (ft) 50 ft  Range of Motion/Exercises Active  Activity Response Tolerated well  Mobility Referral Yes  $Mobility charge 1 Mobility  Mobility Specialist Start Time (ACUTE ONLY) 1120  Mobility Specialist Stop Time (ACUTE ONLY) 1133  Mobility Specialist Time Calculation (min) (ACUTE ONLY) 13 min   Pt received in bed and agreed to mobility with chair follow.  Pt began on 1L with an SpO2% of 92%, desat to 84% when ambulating, took one standing rest break to recover but only to 88%. Continued for 10 more feet then sat down and rode back to room where pt saturation returned >1 minute to 93% back on 1.5L.  Left in chair with all needs met, alarm on.    Marilynne Halsted Mobility Specialist

## 2023-03-09 NOTE — Plan of Care (Signed)
  Problem: Education: Goal: Ability to verbalize activity precautions or restrictions will improve Outcome: Progressing Goal: Knowledge of the prescribed therapeutic regimen will improve Outcome: Progressing Goal: Understanding of discharge needs will improve Outcome: Progressing   Problem: Activity: Goal: Ability to avoid complications of mobility impairment will improve Outcome: Progressing Goal: Will remain free from falls Outcome: Progressing   Problem: Bowel/Gastric: Goal: Gastrointestinal status for postoperative course will improve Outcome: Progressing   Problem: Clinical Measurements: Goal: Ability to maintain clinical measurements within normal limits will improve Outcome: Progressing Goal: Postoperative complications will be avoided or minimized Outcome: Progressing   Problem: Pain Management: Goal: Pain level will decrease Outcome: Progressing   Problem: Skin Integrity: Goal: Will show signs of wound healing Outcome: Progressing   Problem: Health Behavior/Discharge Planning: Goal: Identification of resources available to assist in meeting health care needs will improve Outcome: Progressing   Problem: Bladder/Genitourinary: Goal: Urinary functional status for postoperative course will improve Outcome: Progressing   Problem: Education: Goal: Knowledge of General Education information will improve Description: Including pain rating scale, medication(s)/side effects and non-pharmacologic comfort measures Outcome: Progressing   Problem: Health Behavior/Discharge Planning: Goal: Ability to manage health-related needs will improve Outcome: Progressing   Problem: Clinical Measurements: Goal: Ability to maintain clinical measurements within normal limits will improve Outcome: Progressing Goal: Will remain free from infection Outcome: Progressing Goal: Cardiovascular complication will be avoided Outcome: Progressing   Problem: Nutrition: Goal: Adequate  nutrition will be maintained Outcome: Progressing   Problem: Coping: Goal: Level of anxiety will decrease Outcome: Progressing   Problem: Elimination: Goal: Will not experience complications related to bowel motility Outcome: Progressing Goal: Will not experience complications related to urinary retention Outcome: Progressing   Problem: Safety: Goal: Ability to remain free from injury will improve Outcome: Progressing

## 2023-03-09 NOTE — Progress Notes (Signed)
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 Patient also having cough occasionally productive, mild wheezing.  She has been prescribed mirtazapine recently for low appetite. In the ED: Hypoxic 50% on room air placed on nonrebreather.  Labs showed hypokalemia stable renal function albumin 2.8 Pro-Cal less than 0.1 lactic acid 0.9 BNP 152 CBC with hemoglobin 10.8 influenza COVID-19 screen negative, UA with WBC more than 50 large leukocytes. chest x-ray>> bilateral opacities right more than left concerning for pneumonia. CTA>> occluded left lower lobe pulmonary artery branch since 7/14, stable collapse of T7 with paraspinal soft tissue density and acute kyphosis, interval change of CHF, cardiomegaly interstitial pulmonary edema minimal right pleural effusion known primary lung carcinoma 2.9 cm right lower lobe stable and large lymph nodes, stable left humeral head sclerotic metastasis or infarct, COPD. Patient was wheezing and having rales on exam.  Placed on empiric antibiotic and admitted PCCM was consulted due to worsening hypoxia, echo obtained shows EF 55 to 60% G1 DD normal RV systolic function mitral valve normal aortic valve tricuspid. 9/22 Started on a steroid trial and will continue with empiric antibiotics.  Echocardiogram normal LV RV function.  Assessment & Plan:   Principal Problem:   Acute respiratory failure with hypoxia (HCC) Active Problems:   Hypokalemia   Hypertension   Hypothyroidism   Adenocarcinoma of right lung, stage 4 (HCC)   Constipation    Normocytic anemia   Moderate protein malnutrition (HCC)   Occlusion of left pulmonary artery (HCC)   Cardiomegaly   Pleural effusion due to CHF (congestive heart failure) (HCC)  Acute respiratory distress with acute hypoxic respiratory failure secondary to multifocal bilateral bronchopneumonia/pulmonary infiltrates Adenocarcinoma of right lung stage IV: Imaging shows bony spinal mets pulmonary nodules and lymphadenopathy along with bilateral multifocal opacities Differentials:pneumonia, opportunistic infections, pneumonitis due to either radiation or immunotherapy.BNP was up however echo with normal LV RV function got few doses of IV Lasix initially. PCCM managing appreciate input doing high-dose steroid trial with 125 mg x 3 days.  Patient on Solu-Medrol 60 mg IV daily since 03/05/2023 per PCCM recommendation, they also recommend that patient will eventually need prednisone for 3 weeks.  Patient has been weaned down to 1.5 L oxygen today, much improved compared to yesterday.  Patient encouraged to use incentive spirometry.  Patient now appears to be stable for discharge.  She says that she is typically wheelchair-bound since she had a back surgery in April.  She was assessed by PT OT who recommended SNF.  TOC has been consulted. Continue supplemental oxygen bronchodilators, anticoagulant Eliquis.  Since procalcitonin negative-antibiotic  stopped 9/24 Dr Arbutus Ped aware of admission and agree w/ above management> per PCCM depending upon workup may need to consider change from her current immunotherapy.   Cardiomegaly: But TTE normal.BNP elevated some 162>264   Anemia Mld drop likley dilutional stable and up now  Acute bilateral LE DVT left common femoral and left proximal profunda vein Newly identified subacute left pulmonary embolism: Ct Occluded LLL pulm artery was since July.As planned per oncology-started on Eliquis therapeutic dose    Hypertension: BP well-controlled, meds on hold since  admission.  May need to consider discharging without any antihypertensives.   Hypothyroidism: Continue levothyroxine   Hypokalemia: Resolved   Constipation: Continue stool softener, last BM 9/19   Protein malnutrition moderate: Augment diet, continue Remeron at bedtime Nutrition Status: Goals of care: Previous hospitalist had discussed CODE STATUS with the patient 9/22 and she wished to be DNR no CPR or intubation, patient's 2 daughters at the bedside In agreement.  DVT prophylaxis: Eliquis   Code Status: Limited: Do not attempt resuscitation (DNR) -DNR-LIMITED -Do Not Intubate/DNI   Family Communication: None present at bedside.  Plan of care discussed with patient in length and he/she verbalized understanding and agreed with it.    Status is: Inpatient Remains inpatient appropriate because: Medically stable since 03/07/2023.  TOC consulted.  Pending placement   Estimated body mass index is 26.09 kg/m as calculated from the following:   Height as of this encounter: 5\' 3"  (1.6 m).   Weight as of this encounter: 66.8 kg.    Nutritional Assessment: Body mass index is 26.09 kg/m.Marland Kitchen Seen by dietician.  I agree with the assessment and plan as outlined below: Nutrition Status: Nutrition Problem: Moderate Malnutrition Etiology: chronic illness (stage IV adenocarcinoma of lung) Signs/Symptoms: mild fat depletion, mild muscle depletion Interventions: Refer to RD note for recommendations, Ensure Enlive (each supplement provides 350kcal and 20 grams of protein)  . Skin Assessment: I have examined the patient's skin and I agree with the wound assessment as performed by the wound care RN as outlined below:    Consultants:  Oncology-signed off Pulmonology  Procedures:  As above  Antimicrobials:  Anti-infectives (From admission, onward)    Start     Dose/Rate Route Frequency Ordered Stop   03/01/23 1015  azithromycin (ZITHROMAX) 500 mg in sodium chloride 0.9 % 250 mL IVPB   Status:  Discontinued        500 mg 250 mL/hr over 60 Minutes Intravenous Every 24 hours 03/01/23 1009 03/04/23 0856   03/01/23 1015  cefTRIAXone (ROCEPHIN) 1 g in sodium chloride 0.9 % 100 mL IVPB  Status:  Discontinued        1 g 200 mL/hr over 30 Minutes Intravenous Every 24 hours 03/01/23 1009 03/04/23 0856         Subjective: Seen and examined.  No complaints.  Objective: Vitals:   03/08/23 1138 03/08/23 2101 03/09/23 0559 03/09/23 0800  BP: 132/74 116/76 (!) 152/79   Pulse: 91 93 89   Resp:  20 20   Temp: 97.8 F (36.6 C) 98 F (36.7 C) 98.2 F (36.8 C)   TempSrc: Oral Oral Oral   SpO2: 94% 94% 94% 94%  Weight:      Height:        Intake/Output Summary (Last 24 hours) at 03/09/2023 1038 Last data filed at 03/09/2023 0857 Gross per 24 hour  Intake 560 ml  Output 1900 ml  Net -1340 ml   Filed Weights   03/01/23 0844 03/01/23 2000  Weight: 67.1 kg 66.8 kg    Examination:  General exam: Appears calm and comfortable  Respiratory system: Rales bilaterally. Respiratory effort normal. Cardiovascular system: S1 & S2 heard, RRR. No JVD, murmurs, rubs, gallops or clicks. No pedal edema. Gastrointestinal system: Abdomen is nondistended, soft and nontender. No organomegaly or masses felt. Normal bowel sounds heard. Central nervous system: Alert and oriented. No focal neurological deficits. Extremities: Symmetric 5 x 5 power. Skin: No rashes, lesions or ulcers.  Psychiatry: Judgement and insight appear normal. Mood & affect  appropriate.   Data Reviewed: I have personally reviewed following labs and imaging studies  CBC: Recent Labs  Lab 03/03/23 0336 03/04/23 0500 03/05/23 0628 03/06/23 0617 03/07/23 1009  WBC 4.6 8.2 8.0 7.4 7.6  HGB 11.4* 12.3 12.3 12.0 13.0  HCT 36.0 38.7 39.7 38.8 42.0  MCV 96.5 96.3 96.6 99.5 97.4  PLT 185 178 169 155 171   Basic Metabolic Panel: Recent Labs  Lab 03/03/23 0336 03/04/23 0500 03/05/23 0628 03/06/23 0617  03/07/23 1009  NA 140 138 138 137 137  K 3.3* 4.2 4.3 4.3 4.0  CL 102 102 102 103 102  CO2 29 26 27 29 27   GLUCOSE 162* 158* 148* 104* 100*  BUN 17 19 22 23 21   CREATININE 0.46 0.48 0.49 0.42* 0.55  CALCIUM 8.5* 8.8* 8.6* 8.5* 8.6*   GFR: Estimated Creatinine Clearance: 60.1 mL/min (by C-G formula based on SCr of 0.55 mg/dL). Liver Function Tests: No results for input(s): "AST", "ALT", "ALKPHOS", "BILITOT", "PROT", "ALBUMIN" in the last 168 hours.  No results for input(s): "LIPASE", "AMYLASE" in the last 168 hours. No results for input(s): "AMMONIA" in the last 168 hours. Coagulation Profile: No results for input(s): "INR", "PROTIME" in the last 168 hours.  Cardiac Enzymes: No results for input(s): "CKTOTAL", "CKMB", "CKMBINDEX", "TROPONINI" in the last 168 hours. BNP (last 3 results) No results for input(s): "PROBNP" in the last 8760 hours. HbA1C: No results for input(s): "HGBA1C" in the last 72 hours. CBG: No results for input(s): "GLUCAP" in the last 168 hours.  Lipid Profile: No results for input(s): "CHOL", "HDL", "LDLCALC", "TRIG", "CHOLHDL", "LDLDIRECT" in the last 72 hours. Thyroid Function Tests: No results for input(s): "TSH", "T4TOTAL", "FREET4", "T3FREE", "THYROIDAB" in the last 72 hours. Anemia Panel: No results for input(s): "VITAMINB12", "FOLATE", "FERRITIN", "TIBC", "IRON", "RETICCTPCT" in the last 72 hours. Sepsis Labs: No results for input(s): "PROCALCITON", "LATICACIDVEN" in the last 168 hours.   Recent Results (from the past 240 hour(s))  Resp panel by RT-PCR (RSV, Flu A&B, Covid) Anterior Nasal Swab     Status: None   Collection Time: 03/01/23  9:27 AM   Specimen: Anterior Nasal Swab  Result Value Ref Range Status   SARS Coronavirus 2 by RT PCR NEGATIVE NEGATIVE Final    Comment: (NOTE) SARS-CoV-2 target nucleic acids are NOT DETECTED.  The SARS-CoV-2 RNA is generally detectable in upper respiratory specimens during the acute phase of infection.  The lowest concentration of SARS-CoV-2 viral copies this assay can detect is 138 copies/mL. A negative result does not preclude SARS-Cov-2 infection and should not be used as the sole basis for treatment or other patient management decisions. A negative result may occur with  improper specimen collection/handling, submission of specimen other than nasopharyngeal swab, presence of viral mutation(s) within the areas targeted by this assay, and inadequate number of viral copies(<138 copies/mL). A negative result must be combined with clinical observations, patient history, and epidemiological information. The expected result is Negative.  Fact Sheet for Patients:  BloggerCourse.com  Fact Sheet for Healthcare Providers:  SeriousBroker.it  This test is no t yet approved or cleared by the [place] FDA and  has been authorized for detection and/or diagnosis of SARS-CoV-2 by FDA under an Emergency Use Authorization (EUA). This EUA will remain  in effect (meaning this test can be used) for the duration of the COVID-19 declaration under Section 564(b)(1) of the Act, 21 U.S.C.section 360bbb-3(b)(1), unless the authorization is terminated  or revoked sooner.  Influenza A by PCR NEGATIVE NEGATIVE Final   Influenza B by PCR NEGATIVE NEGATIVE Final    Comment: (NOTE) The Xpert Xpress SARS-CoV-2/FLU/RSV plus assay is intended as an aid in the diagnosis of influenza from Nasopharyngeal swab specimens and should not be used as a sole basis for treatment. Nasal washings and aspirates are unacceptable for Xpert Xpress SARS-CoV-2/FLU/RSV testing.  Fact Sheet for Patients: BloggerCourse.com  Fact Sheet for Healthcare Providers: SeriousBroker.it  This test is not yet approved or cleared by the [place] FDA and has been authorized for detection and/or diagnosis of SARS-CoV-2 by FDA  under an Emergency Use Authorization (EUA). This EUA will remain in effect (meaning this test can be used) for the duration of the COVID-19 declaration under Section 564(b)(1) of the Act, 21 U.S.C. section 360bbb-3(b)(1), unless the authorization is terminated or revoked.     Resp Syncytial Virus by PCR NEGATIVE NEGATIVE Final    Comment: (NOTE) Fact Sheet for Patients: BloggerCourse.com  Fact Sheet for Healthcare Providers: SeriousBroker.it  This test is not yet approved or cleared by the [place] FDA and has been authorized for detection and/or diagnosis of SARS-CoV-2 by FDA under an Emergency Use Authorization (EUA). This EUA will remain in effect (meaning this test can be used) for the duration of the COVID-19 declaration under Section 564(b)(1) of the Act, 21 U.S.C. section 360bbb-3(b)(1), unless the authorization is terminated or revoked.  Performed at [facility], [address] [place], [place] [address]   Blood Culture (routine x 2)     Status: None   Collection Time: 03/01/23  9:27 AM   Specimen: BLOOD RIGHT ARM  Result Value Ref Range Status   Specimen Description   Final    BLOOD RIGHT ARM Performed at [facility], [address] [place], [place] [address]    Special Requests   Final    BOTTLES DRAWN AEROBIC AND ANAEROBIC Blood Culture adequate volume Performed at [facility], [address] [place], [place] [address]    Culture   Final    NO GROWTH 5 DAYS Performed at [facility], [address], [place], [place] [address]    Report Status 03/06/2023 FINAL  Final  Blood Culture (routine x 2)     Status: Abnormal   Collection Time: 03/01/23  9:49 AM   Specimen: BLOOD  Result Value Ref Range Status   Specimen Description   Final    BLOOD LEFT ANTECUBITAL Performed at [facility], [address], [place], [place] [address]     Special Requests   Final    BOTTLES DRAWN AEROBIC AND ANAEROBIC Blood Culture adequate volume Performed at [facility], [address] [place], [place] [address]    Culture  Setup Time   Final    GRAM POSITIVE RODS ANAEROBIC BOTTLE ONLY CRITICAL RESULT CALLED TO, READ BACK BY AND VERIFIED WITH: L POINDEXTER,PHARMD@[id number]  03/06/23 MK    Culture (A)  Final    CUTIBACTERIUM ACNES Standardized susceptibility testing for this organism is not available. Performed at [facility], [address], [place], [place] [address]    Report Status 03/08/2023 FINAL  Final  Urine Culture (for pregnant, neutropenic or urologic patients or patients with an indwelling urinary catheter)     Status: Abnormal   Collection Time: 03/01/23 11:58 AM   Specimen: Urine, Clean Catch  Result Value Ref Range Status   Specimen Description   Final  URINE, CLEAN CATCH Performed at [facility], [address], [place], [place] [address]    Special Requests   Final    NONE Performed at [facility], [address], [place], [place] [address]    Culture 60,000 COLONIES/mL ESCHERICHIA COLI (A)  Final   Report Status 03/05/2023 FINAL  Final   Organism ID, Bacteria ESCHERICHIA COLI (A)  Final      Susceptibility   Escherichia coli - MIC*    AMPICILLIN 8 SENSITIVE Sensitive     CEFAZOLIN <=4 SENSITIVE Sensitive     CEFEPIME <=0.12 SENSITIVE Sensitive     CEFTRIAXONE <=0.25 SENSITIVE Sensitive     CIPROFLOXACIN <=0.25 SENSITIVE Sensitive     GENTAMICIN <=1 SENSITIVE Sensitive     IMIPENEM 0.5 SENSITIVE Sensitive     NITROFURANTOIN <=16 SENSITIVE Sensitive     TRIMETH/SULFA <=20 SENSITIVE Sensitive     AMPICILLIN/SULBACTAM 4 SENSITIVE Sensitive     PIP/TAZO <=4 SENSITIVE Sensitive     * 60,000 COLONIES/mL ESCHERICHIA COLI  MRSA Next Gen by PCR, Nasal     Status: None   Collection Time: 03/01/23  9:50 PM   Specimen: Nasal Mucosa; Nasal Swab  Result  Value Ref Range Status   MRSA by PCR Next Gen NOT DETECTED NOT DETECTED Final    Comment: (NOTE) The GeneXpert MRSA Assay (FDA approved for NASAL specimens only), is one component of a comprehensive MRSA colonization surveillance program. It is not intended to diagnose MRSA infection nor to guide or monitor treatment for MRSA infections. Test performance is not FDA approved in patients less than 55 years old. Performed at [facility], [address], [place], [place] [address]      Radiology Studies: No results found.  Scheduled Meds:  apixaban  5 mg Oral BID   Chlorhexidine Gluconate Cloth  6 each Topical QHS   feeding supplement  237 mL Oral BID BM   folic acid  1 mg Oral Daily   levothyroxine  100 mcg Oral Q0600   methylPREDNISolone (SOLU-MEDROL) injection  60 mg Intravenous Q24H   mirtazapine  30 mg Oral QHS   senna  1 tablet Oral Daily   Continuous Infusions:   LOS: 8 days   Hughie Closs, MD [facility]  03/09/2023, 10:38 AM   *Please note that this is a verbal dictation therefore any spelling or grammatical errors are due to the "Dragon Medical One" system interpretation.  Please page via Amion and do not message via secure chat for urgent patient care matters. Secure chat can be used for non urgent patient care matters.  How to contact the [facility] Attending or Consulting provider 7A - 7P or covering provider during after hours 7P -7A, for this patient?  Check the care team in [facility] and look for a) attending/consulting TR[facility] provider listed and b) the [facility] team listed. Page or secure chat 7A-7P. Log into [email or web address] and use [facility]'s universal password to access. If you do not have the password, please contact the hospital operator. Locate the [facility] provider you are looking for under [facility] and page to a number that you can be directly reached. If you still have difficulty reaching the provider, please page the [facility] (Director on Call)  for the Hospitalists listed on amion for assistance.

## 2023-03-10 ENCOUNTER — Other Ambulatory Visit: Payer: Self-pay | Admitting: Physician Assistant

## 2023-03-10 DIAGNOSIS — J9601 Acute respiratory failure with hypoxia: Secondary | ICD-10-CM | POA: Diagnosis not present

## 2023-03-10 DIAGNOSIS — C3491 Malignant neoplasm of unspecified part of right bronchus or lung: Secondary | ICD-10-CM

## 2023-03-10 NOTE — Progress Notes (Signed)
DIAGNOSIS: Stage IV (T1b, N2, M1 C) non-small cell lung cancer, adenocarcinoma presented with right lower lobe lung nodule in addition to small bilateral pulmonary nodules and right hilar and mediastinal lymphadenopathy in addition to liver and bone metastasis diagnosed in April 2024. 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   PDL1: 20%   Guardant 360: no actionable mutations.    PRIOR THERAPY: 1) Decompressive thoracic laminectomy of the T7 with spinal cord decompression and biopsy of the epidural mass in April 2024.  2) Palliative radiation to the bone under the care of Dr. Basilio Cairo. Last day scheduled for 10/25/22    CURRENT THERAPY: Carboplatin for an AUC of 5, Alimta 500 mg/m, Keytruda 200 mg IV every 3 weeks.  First dose expected on 10/28/2022. Status post 6 cycles.  Starting from cycle #5 the patient is on maintenance treatment with Alimta and Keytruda every 3 weeks.  Subjective: The patient, a 72 year old with stage 4 non-small cell lung cancer, was recently admitted to the intensive care unit due to shortness of breath. The patient's condition improved after being started on a 60mg  dose of Solumedrol, a steroid treatment for immune therapy mediated (pneumonitis). The patient reported that the steroid treatment improved her breathing, although she still required an increase in oxygen supply when walking.  The patient reported experiencing night sweats, which were attributed to the steroid treatment. She denied any new cough, chest pain, nausea, vomiting, fever, or chills. The patient's appetite remained good.  The patient was scheduled to be transferred to a skilled nursing facility for rehabilitation before returning home. The patient had previously been on chemotherapy and immune therapy, but the latter was paused due to the pneumonitis.  The patient had a history of developing a rash when  taking prednisone orally. The patient was aware of the potential for a rash with this medication.  Objective: Vital signs in last 24 hours: Temp:  [98.1 F (36.7 C)-99 F (37.2 C)] 99 F (37.2 C) (09/30 1952) Pulse Rate:  [79-87] 87 (09/30 1952) Resp:  [16-20] 18 (09/30 1952) BP: (118-133)/(59-85) 119/59 (09/30 1952) SpO2:  [94 %-95 %] 94 % (09/30 1951)  Intake/Output from previous day: 09/29 0701 - 09/30 0700 In: 680 [P.O.:680] Out: 1700 [Urine:1700] Intake/Output this shift: No intake/output data recorded.  General appearance: alert, cooperative, fatigued, and no distress Resp: clear to auscultation bilaterally Cardio: regular rate and rhythm, S1, S2 normal, no murmur, click, rub or gallop GI: soft, non-tender; bowel sounds normal; no masses,  no organomegaly Extremities: extremities normal, atraumatic, no cyanosis or edema  Lab Results:  No results for input(s): "WBC", "HGB", "HCT", "PLT" in the last 72 hours. BMET No results for input(s): "NA", "K", "CL", "CO2", "GLUCOSE", "BUN", "CREATININE", "CALCIUM" in the last 72 hours.  Studies/Results: No results found.  Medications: I have reviewed the patient's current medications.  Assessment/Plan: This is a very pleasant 72 years old white female with a stage IV non-small cell lung cancer, adenocarcinoma diagnosed in April 2024 with no actionable mutations and positive PD-L1 expression of 20%.  She started induction chemotherapy with carboplatin, Alimta and Keytruda for 4 cycles and she is currently on maintenance treatment with Alimta and Keytruda every 3 weeks status post 2 more cycles.  The patient has been tolerating this treatment fairly well. The patient was admitted to the hospital with respiratory distress secondary to questionable pneumonia, congestive heart failure or immunotherapy mediated pneumonitis.  She is current on treatment with high-dose Solu-Medrol and she is feeling much better.  Stage 4 Non-Small Cell Lung  Cancer Currently on chemo and immune therapy. Presented with shortness of breath likely secondary to pneumonitis from immune therapy. Improvement noted with Solumedrol 60mg  treatment. -Continue Solumedrol 60mg  and taper dose gradually once patient leaves the hospital. -Delay chemotherapy until patient's condition improves. -Consider removing Keytruda from treatment plan and continue with Alimta only due to pneumonitis. Reevaluate the addition of Keytruda in the future.  Post-Hospitalization Rehabilitation Patient to be transferred to a skilled nursing facility for rehabilitation before returning home. -Ensure smooth transition to skilled nursing facility and plan for home return after rehabilitation.  Follow-up Monitor patient's response to steroid treatment and adjust dosage as necessary. -Schedule follow-up appointment in the office after patient leaves the hospital.   LOS: 9 days    Lajuana Matte 03/10/2023

## 2023-03-10 NOTE — Progress Notes (Signed)
Physical Therapy Treatment Patient Details Name: Hayley Wu MRN: [id number] DOB: 02-29-1951 Today's Date: 03/10/2023   History of Present Illness Pt is 72 yo female admitted on 03/05/23 with acute respiratory failure and found to have new bil pulmonary infiltrates.  Pt with bil acute DVT started on Eliquis and subacute L PE.  Pt with hx including but not limited to stage IV adenocarcinoma lung, hepatic and bony metastases that required T7 laminectomy due to spinal cord compression 09/2022    PT Comments  General Comments: AxO x 3 pleasant and motivated.  Pt lives home alone. Now agreeing to SNF. Assisted with amb.  Pt required 2 lts oxygen during activity to achieve sats >90%.   Pt will need ST Rehab at SNF to address mobility and functional decline prior to safely returning home.    If plan is discharge home, recommend the following: A little help with walking and/or transfers;A little help with bathing/dressing/bathroom;Assistance with cooking/housework;Help with stairs or ramp for entrance   Can travel by private vehicle     Yes  Equipment Recommendations  None recommended by PT    Recommendations for Other Services       Precautions / Restrictions Precautions Precautions: Fall Precaution Comments: monitor sats Restrictions Weight Bearing Restrictions: No     Mobility  Bed Mobility               General bed mobility comments: was OOB in recliner    Transfers Overall transfer level: Needs assistance Equipment used: Rolling walker (2 wheels) Transfers: Sit to/from Stand Sit to Stand: Contact guard assist   Step pivot transfers: Min assist       General transfer comment: extra effort to rise uop, pushes with UE's    Ambulation/Gait Ambulation/Gait assistance: Min assist, +2 safety/equipment Gait Distance (Feet): 52 Feet Assistive device: Rolling walker (2 wheels) Gait Pattern/deviations: Step-to pattern, Decreased stride length, Trunk flexed,  Ataxic Gait velocity: decreased distance requiring rest breaks.  Pt required 2 lts oxygen during acitivy     General Gait Details: seated rest break   Stairs             Wheelchair Mobility     Tilt Bed    Modified Rankin (Stroke Patients Only)       Balance                                            Cognition Arousal: Alert Behavior During Therapy: WFL for tasks assessed/performed Overall Cognitive Status: Within Functional Limits for tasks assessed                                 General Comments: AxO x 3 pleasant and motivated.  Pt lives home alone. Now agreeing to SNF.        Exercises      General Comments        Pertinent Vitals/Pain Pain Assessment Pain Assessment: No/denies pain    Home Living                          Prior Function            PT Goals (current goals can now be found in the care plan section) Progress towards PT goals: Progressing toward goals    Frequency  Min 1X/week      PT Plan      Co-evaluation              AM-PAC PT "6 Clicks" Mobility   Outcome Measure  Help needed turning from your back to your side while in a flat bed without using bedrails?: A Little Help needed moving from lying on your back to sitting on the side of a flat bed without using bedrails?: A Little Help needed moving to and from a bed to a chair (including a wheelchair)?: A Little Help needed standing up from a chair using your arms (e.g., wheelchair or bedside chair)?: A Little Help needed to walk in hospital room?: A Little Help needed climbing 3-5 steps with a railing? : Total 6 Click Score: 16    End of Session Equipment Utilized During Treatment: Gait belt Activity Tolerance: Patient tolerated treatment well Patient left: in chair;with call bell/phone within reach;with family/visitor present;with chair alarm set Nurse Communication: Mobility status PT Visit Diagnosis: Other  abnormalities of gait and mobility (R26.89)     Time: 1610-9604 PT Time Calculation (min) (ACUTE ONLY): 26 min  Charges:    $Gait Training: 8-22 mins $Therapeutic Activity: 8-22 mins PT General Charges $$ ACUTE PT VISIT: 1 Visit                     Felecia Shelling  PTA Acute  Rehabilitation Services Office M-F          [phone]

## 2023-03-10 NOTE — Progress Notes (Signed)
PROGRESS NOTE    Hayley Wu  XLK:[id number] DOB: 1950-10-05 DOA: 03/01/2023 PCP: [name], PA-C   Brief Narrative:  72 yof w/ history significant of seasonal allergies, hypertension, tobacco use, hypothyroidism, constipation cord compression due to metastatic disease most severe at the T7 level, hypothyroidism is coming to the emergency department due to respiratory distress w/ spo2 50% on RA , lethargy and low-grade fever 72 since early morning 03/01/23. Patient also having cough occasionally productive, mild wheezing.  She has been prescribed mirtazapine recently for low appetite. In the ED: Hypoxic 50% on room air placed on nonrebreather.  Labs showed hypokalemia stable renal function albumin 2.8 Pro-Cal less than 0.1 lactic acid 0.9 BNP 152 CBC with hemoglobin 10.8 influenza COVID-19 screen negative, UA with WBC more than 50 large leukocytes. chest x-ray>> bilateral opacities right more than left concerning for pneumonia. CTA>> occluded left lower lobe pulmonary artery branch since 7/14, stable collapse of T7 with paraspinal soft tissue density and acute kyphosis, interval change of CHF, cardiomegaly interstitial pulmonary edema minimal right pleural effusion known primary lung carcinoma 2.9 cm right lower lobe stable and large lymph nodes, stable left humeral head sclerotic metastasis or infarct, COPD. Patient was wheezing and having rales on exam.  Placed on empiric antibiotic and admitted PCCM was consulted due to worsening hypoxia, echo obtained shows EF 55 to 60% G1 DD normal RV systolic function mitral valve normal aortic valve tricuspid. 9/22 Started on a steroid trial and will continue with empiric antibiotics.  Echocardiogram normal LV RV function.  Assessment & Plan:   Principal Problem:   Acute respiratory failure with hypoxia (HCC) Active Problems:   Hypokalemia   Hypertension   Hypothyroidism   Adenocarcinoma of right lung, stage 4 (HCC)   Constipation    Normocytic anemia   Moderate protein malnutrition (HCC)   Occlusion of left pulmonary artery (HCC)   Cardiomegaly   Pleural effusion due to CHF (congestive heart failure) (HCC)  Acute respiratory distress with acute hypoxic respiratory failure secondary to multifocal bilateral bronchopneumonia/pulmonary infiltrates Adenocarcinoma of right lung stage IV: Imaging shows bony spinal mets pulmonary nodules and lymphadenopathy along with bilateral multifocal opacities Differentials:pneumonia, opportunistic infections, pneumonitis due to either radiation or immunotherapy.BNP was up however echo with normal LV RV function got few doses of IV Lasix initially. PCCM managing appreciate input doing high-dose steroid trial with 125 mg x 3 days.  Patient on Solu-Medrol 60 mg IV daily since 03/05/2023 per PCCM recommendation, they also recommend that patient will eventually need prednisone for 3 weeks.  Patient has been weaned down to 1.5 L since 03/07/2023, upon ambulatory oximetry checking on 03/09/2023, she was observed to be requiring 3 L of oxygen to keep oxygen saturation over 92%.  She was assessed by PT OT who recommended SNF.  TOC has been consulted.  Waiting for insurance authorization. Continue supplemental oxygen bronchodilators, anticoagulant Eliquis.  Since procalcitonin negative-antibiotic  stopped 9/24 Dr Arbutus Ped aware of admission and agree w/ above management> per PCCM depending upon workup may need to consider change from her current immunotherapy.   Cardiomegaly: But TTE normal.BNP elevated some 162>264   Anemia Mld drop likley dilutional stable and up now  Acute bilateral LE DVT left common femoral and left proximal profunda vein Newly identified subacute left pulmonary embolism: Ct Occluded LLL pulm artery was since July.As planned per oncology-started on Eliquis therapeutic dose    Hypertension: BP well-controlled, meds on hold since admission.  May need to consider discharging without  any  antihypertensives.   Hypothyroidism: Continue levothyroxine   Hypokalemia: Resolved   Constipation: Continue stool softener, last BM 9/19   Protein malnutrition moderate: Augment diet, continue Remeron at bedtime Nutrition Status: Goals of care: Previous hospitalist had discussed CODE STATUS with the patient 9/22 and she wished to be DNR no CPR or intubation, patient's 2 daughters at the bedside In agreement.  DVT prophylaxis: Eliquis   Code Status: Limited: Do not attempt resuscitation (DNR) -DNR-LIMITED -Do Not Intubate/DNI   Family Communication: None present at bedside.  Plan of care discussed with patient in length and he/she verbalized understanding and agreed with it.    Status is: Inpatient Remains inpatient appropriate because: Medically stable since 03/07/2023.  TOC consulted.  Pending placement   Estimated body mass index is 26.09 kg/m as calculated from the following:   Height as of this encounter: 5\' 3"  (1.6 m).   Weight as of this encounter: 66.8 kg.    Nutritional Assessment: Body mass index is 26.09 kg/m.Marland Kitchen Seen by dietician.  I agree with the assessment and plan as outlined below: Nutrition Status: Nutrition Problem: Moderate Malnutrition Etiology: chronic illness (stage IV adenocarcinoma of lung) Signs/Symptoms: mild fat depletion, mild muscle depletion Interventions: Refer to RD note for recommendations, Ensure Enlive (each supplement provides 350kcal and 20 grams of protein)  . Skin Assessment: I have examined the patient's skin and I agree with the wound assessment as performed by the wound care RN as outlined below:    Consultants:  Oncology-signed off Pulmonology  Procedures:  As above  Antimicrobials:  Anti-infectives (From admission, onward)    Start     Dose/Rate Route Frequency Ordered Stop   03/01/23 1015  azithromycin (ZITHROMAX) 500 mg in sodium chloride 0.9 % 250 mL IVPB  Status:  Discontinued        500 mg 250 mL/hr over 60  Minutes Intravenous Every 24 hours 03/01/23 1009 03/04/23 0856   03/01/23 1015  cefTRIAXone (ROCEPHIN) 1 g in sodium chloride 0.9 % 100 mL IVPB  Status:  Discontinued        1 g 200 mL/hr over 30 Minutes Intravenous Every 24 hours 03/01/23 1009 03/04/23 0856         Subjective: Seen and examined.  No complaints.  Advised to use incentive spirometry more frequently/every hour and try to sit in the recliner for the times that she does not have to sleep.  Objective: Vitals:   03/09/23 1947 03/10/23 0402 03/10/23 0812 03/10/23 0900  BP: 119/68 133/85    Pulse: 91 79    Resp: 16 17 20 17   Temp: 98.7 F (37.1 C) 98.7 F (37.1 C)    TempSrc: Oral Oral    SpO2: 94% 94%    Weight:      Height:        Intake/Output Summary (Last 24 hours) at 03/10/2023 1214 Last data filed at 03/10/2023 0828 Gross per 24 hour  Intake 820 ml  Output 1300 ml  Net -480 ml   Filed Weights   03/01/23 0844 03/01/23 2000  Weight: 67.1 kg 66.8 kg    Examination:  General exam: Appears calm and comfortable  Respiratory system: Rales bilaterally. Respiratory effort normal. Cardiovascular system: S1 & S2 heard, RRR. No JVD, murmurs, rubs, gallops or clicks. No pedal edema. Gastrointestinal system: Abdomen is nondistended, soft and nontender. No organomegaly or masses felt. Normal bowel sounds heard. Central nervous system: Alert and oriented. No focal neurological deficits. Extremities: Symmetric 5 x 5 power. Skin: No rashes,  lesions or ulcers.  Psychiatry: Judgement and insight appear normal. Mood & affect appropriate.    Data Reviewed: I have personally reviewed following labs and imaging studies  CBC: Recent Labs  Lab 03/04/23 0500 03/05/23 0628 03/06/23 0617 03/07/23 1009  WBC 8.2 8.0 7.4 7.6  HGB 12.3 12.3 12.0 13.0  HCT 38.7 39.7 38.8 42.0  MCV 96.3 96.6 99.5 97.4  PLT 178 169 155 171   Basic Metabolic Panel: Recent Labs  Lab 03/04/23 0500 03/05/23 0628 03/06/23 0617  03/07/23 1009  NA 138 138 137 137  K 4.2 4.3 4.3 4.0  CL 102 102 103 102  CO2 26 27 29 27   GLUCOSE 158* 148* 104* 100*  BUN 19 22 23 21   CREATININE 0.48 0.49 0.42* 0.55  CALCIUM 8.8* 8.6* 8.5* 8.6*   GFR: Estimated Creatinine Clearance: 60.1 mL/min (by C-G formula based on SCr of 0.55 mg/dL). Liver Function Tests: No results for input(s): "AST", "ALT", "ALKPHOS", "BILITOT", "PROT", "ALBUMIN" in the last 168 hours.  No results for input(s): "LIPASE", "AMYLASE" in the last 168 hours. No results for input(s): "AMMONIA" in the last 168 hours. Coagulation Profile: No results for input(s): "INR", "PROTIME" in the last 168 hours.  Cardiac Enzymes: No results for input(s): "CKTOTAL", "CKMB", "CKMBINDEX", "TROPONINI" in the last 168 hours. BNP (last 3 results) No results for input(s): "PROBNP" in the last 8760 hours. HbA1C: No results for input(s): "HGBA1C" in the last 72 hours. CBG: No results for input(s): "GLUCAP" in the last 168 hours.  Lipid Profile: No results for input(s): "CHOL", "HDL", "LDLCALC", "TRIG", "CHOLHDL", "LDLDIRECT" in the last 72 hours. Thyroid Function Tests: No results for input(s): "TSH", "T4TOTAL", "FREET4", "T3FREE", "THYROIDAB" in the last 72 hours. Anemia Panel: No results for input(s): "VITAMINB12", "FOLATE", "FERRITIN", "TIBC", "IRON", "RETICCTPCT" in the last 72 hours. Sepsis Labs: No results for input(s): "PROCALCITON", "LATICACIDVEN" in the last 168 hours.   Recent Results (from the past 240 hour(s))  Resp panel by RT-PCR (RSV, Flu A&B, Covid) Anterior Nasal Swab     Status: None   Collection Time: 03/01/23  9:27 AM   Specimen: Anterior Nasal Swab  Result Value Ref Range Status   SARS Coronavirus 2 by RT PCR NEGATIVE NEGATIVE Final    Comment: (NOTE) SARS-CoV-2 target nucleic acids are NOT DETECTED.  The SARS-CoV-2 RNA is generally detectable in upper respiratory specimens during the acute phase of infection. The lowest concentration of  SARS-CoV-2 viral copies this assay can detect is 138 copies/mL. A negative result does not preclude SARS-Cov-2 infection and should not be used as the sole basis for treatment or other patient management decisions. A negative result may occur with  improper specimen collection/handling, submission of specimen other than nasopharyngeal swab, presence of viral mutation(s) within the areas targeted by this assay, and inadequate number of viral copies(<138 copies/mL). A negative result must be combined with clinical observations, patient history, and epidemiological information. The expected result is Negative.  Fact Sheet for Patients:  BloggerCourse.com  Fact Sheet for Healthcare Providers:  SeriousBroker.it  This test is no t yet approved or cleared by the [place] FDA and  has been authorized for detection and/or diagnosis of SARS-CoV-2 by FDA under an Emergency Use Authorization (EUA). This EUA will remain  in effect (meaning this test can be used) for the duration of the COVID-19 declaration under Section 564(b)(1) of the Act, 21 U.S.C.section 360bbb-3(b)(1), unless the authorization is terminated  or revoked sooner.       Influenza A  by PCR NEGATIVE NEGATIVE Final   Influenza B by PCR NEGATIVE NEGATIVE Final    Comment: (NOTE) The Xpert Xpress SARS-CoV-2/FLU/RSV plus assay is intended as an aid in the diagnosis of influenza from Nasopharyngeal swab specimens and should not be used as a sole basis for treatment. Nasal washings and aspirates are unacceptable for Xpert Xpress SARS-CoV-2/FLU/RSV testing.  Fact Sheet for Patients: BloggerCourse.com  Fact Sheet for Healthcare Providers: SeriousBroker.it  This test is not yet approved or cleared by the [place] FDA and has been authorized for detection and/or diagnosis of SARS-CoV-2 by FDA under an Emergency Use  Authorization (EUA). This EUA will remain in effect (meaning this test can be used) for the duration of the COVID-19 declaration under Section 564(b)(1) of the Act, 21 U.S.C. section 360bbb-3(b)(1), unless the authorization is terminated or revoked.     Resp Syncytial Virus by PCR NEGATIVE NEGATIVE Final    Comment: (NOTE) Fact Sheet for Patients: BloggerCourse.com  Fact Sheet for Healthcare Providers: SeriousBroker.it  This test is not yet approved or cleared by the [place] FDA and has been authorized for detection and/or diagnosis of SARS-CoV-2 by FDA under an Emergency Use Authorization (EUA). This EUA will remain in effect (meaning this test can be used) for the duration of the COVID-19 declaration under Section 564(b)(1) of the Act, 21 U.S.C. section 360bbb-3(b)(1), unless the authorization is terminated or revoked.  Performed at [facility], [address], [place], [place] [address]   Blood Culture (routine x 2)     Status: None   Collection Time: 03/01/23  9:27 AM   Specimen: BLOOD RIGHT ARM  Result Value Ref Range Status   Specimen Description   Final    BLOOD RIGHT ARM Performed at [facility], [address] [place], [place] [address]    Special Requests   Final    BOTTLES DRAWN AEROBIC AND ANAEROBIC Blood Culture adequate volume Performed at [facility], [address] [place], [place] [address]    Culture   Final    NO GROWTH 5 DAYS Performed at [facility], [address], [place], [place] [address]    Report Status 03/06/2023 FINAL  Final  Blood Culture (routine x 2)     Status: Abnormal   Collection Time: 03/01/23  9:49 AM   Specimen: BLOOD  Result Value Ref Range Status   Specimen Description   Final    BLOOD LEFT ANTECUBITAL Performed at [facility], [address] [place], [place] [address]    Special Requests   Final     BOTTLES DRAWN AEROBIC AND ANAEROBIC Blood Culture adequate volume Performed at [facility], [address], [address] [place], [place] [address]    Culture  Setup Time   Final    GRAM POSITIVE RODS ANAEROBIC BOTTLE ONLY CRITICAL RESULT CALLED TO, READ BACK BY AND VERIFIED WITH: L POINDEXTER,PHARMD@0252  03/06/23 MK    Culture (A)  Final    CUTIBACTERIUM ACNES Standardized susceptibility testing for this organism is not available. Performed at [facility], [address], [place], [place] [address]    Report Status 03/08/2023 FINAL  Final  Urine Culture (for pregnant, neutropenic or urologic patients or patients with an indwelling urinary catheter)     Status: Abnormal   Collection Time: 03/01/23 11:58 AM   Specimen: Urine, Clean Catch  Result Value Ref Range Status   Specimen Description   Final    URINE,  CLEAN CATCH Performed at [facility], [address] [place], [place] [address]    Special Requests   Final    NONE Performed at [facility], [address], [place], [place] [address]    Culture 60,000 COLONIES/mL ESCHERICHIA COLI (A)  Final   Report Status 03/05/2023 FINAL  Final   Organism ID, Bacteria ESCHERICHIA COLI (A)  Final      Susceptibility   Escherichia coli - MIC*    AMPICILLIN 8 SENSITIVE Sensitive     CEFAZOLIN <=4 SENSITIVE Sensitive     CEFEPIME <=0.12 SENSITIVE Sensitive     CEFTRIAXONE <=0.25 SENSITIVE Sensitive     CIPROFLOXACIN <=0.25 SENSITIVE Sensitive     GENTAMICIN <=1 SENSITIVE Sensitive     IMIPENEM 0.5 SENSITIVE Sensitive     NITROFURANTOIN <=16 SENSITIVE Sensitive     TRIMETH/SULFA <=20 SENSITIVE Sensitive     AMPICILLIN/SULBACTAM 4 SENSITIVE Sensitive     PIP/TAZO <=4 SENSITIVE Sensitive     * 60,000 COLONIES/mL ESCHERICHIA COLI  MRSA Next Gen by PCR, Nasal     Status: None   Collection Time: 03/01/23  9:50 PM   Specimen: Nasal Mucosa; Nasal Swab  Result Value Ref Range Status    MRSA by PCR Next Gen NOT DETECTED NOT DETECTED Final    Comment: (NOTE) The GeneXpert MRSA Assay (FDA approved for NASAL specimens only), is one component of a comprehensive MRSA colonization surveillance program. It is not intended to diagnose MRSA infection nor to guide or monitor treatment for MRSA infections. Test performance is not FDA approved in patients less than 66 years old. Performed at [facility], [address], [place], [place] [address]      Radiology Studies: No results found.  Scheduled Meds:  apixaban  5 mg Oral BID   Chlorhexidine Gluconate Cloth  6 each Topical QHS   feeding supplement  237 mL Oral BID BM   folic acid  1 mg Oral Daily   levothyroxine  100 mcg Oral Q0600   methylPREDNISolone (SOLU-MEDROL) injection  60 mg Intravenous Q24H   mirtazapine  30 mg Oral QHS   senna  1 tablet Oral Daily   Continuous Infusions:   LOS: 9 days   Hughie Closs, MD [facility]  03/10/2023, 12:14 PM   *Please note that this is a verbal dictation therefore any spelling or grammatical errors are due to the "Dragon Medical One" system interpretation.  Please page via Amion and do not message via secure chat for urgent patient care matters. Secure chat can be used for non urgent patient care matters.  How to contact the [facility] Attending or Consulting provider 7A - 7P or covering provider during after hours 7P -7A, for this patient?  Check the care team in [facility] and look for a) attending/consulting TRH provider listed and b) the [facility] team listed. Page or secure chat 7A-7P. Log into [email or web address] and use [place]'s universal password to access. If you do not have the password, please contact the hospital operator. Locate the [facility] provider you are looking for under [facility] and page to a number that you can be directly reached. If you still have difficulty reaching the provider, please page the [facility] (Director on Call) for the Hospitalists listed  on amion for assistance.

## 2023-03-10 NOTE — Plan of Care (Signed)
  Problem: Safety: Goal: Ability to remain free from injury will improve Outcome: Progressing   Problem: Skin Integrity: Goal: Risk for impaired skin integrity will decrease Outcome: Progressing   Problem: Coping: Goal: Level of anxiety will decrease Outcome: Progressing   Problem: Clinical Measurements: Goal: Respiratory complications will improve Outcome: Progressing

## 2023-03-10 NOTE — TOC Progression Note (Addendum)
Transition of Care [facility]) - Progression Note    Patient Details  Name: Hayley Wu MRN: [id number] Date of Birth: February 02, 1951  Transition of Care [facility] CM/SW Contact  Howell Rucks, RN Phone Number: 03/10/2023, 11:01 AM  Clinical Narrative:  Met with pt at bedside to introduce role of TOC/NCM, short term rehab bed offer list presented to patient, patient reports she was hoping to have a bed offer from [facility]. NCM call to [facility], sw [place] in admissions, requested review for bed offer. Lowella Bandy reports she will review and update in SNF HUB. Await bed choice. Insurance auth initiated.   -4:06pm Patient/family selected [facility] for short term rehab/SNF. Call to [employer] at [phone], vm left with pt name, DOB, subscriber ID, to initiate auth for short term rehab, left NCM name and phone number for call back.          Expected Discharge Plan and Services                                               Social Determinants of Health (SDOH) Interventions SDOH Screenings   Food Insecurity: No Food Insecurity (03/01/2023)  Housing: Low Risk  (03/01/2023)  Transportation Needs: No Transportation Needs (03/01/2023)  Utilities: Not At Risk (03/01/2023)  Depression (PHQ2-9): Low Risk  (10/02/2022)  Tobacco Use: High Risk (03/01/2023)    Readmission Risk Interventions    03/04/2023    6:23 PM  Readmission Risk Prevention Plan  Transportation Screening Complete  PCP or Specialist Appt within 3-5 Days Complete  HRI or Home Care Consult Complete  Social Work Consult for Recovery Care Planning/Counseling Complete  Palliative Care Screening Not Applicable  Medication Review Oceanographer) Complete

## 2023-03-10 NOTE — Plan of Care (Signed)
  Problem: Education: Goal: Ability to verbalize activity precautions or restrictions will improve Outcome: Progressing   

## 2023-03-11 DIAGNOSIS — J9601 Acute respiratory failure with hypoxia: Secondary | ICD-10-CM | POA: Diagnosis not present

## 2023-03-11 LAB — BASIC METABOLIC PANEL
Anion gap: 8 (ref 5–15)
BUN: 20 mg/dL (ref 8–23)
CO2: 26 mmol/L (ref 22–32)
Calcium: 8.5 mg/dL — ABNORMAL LOW (ref 8.9–10.3)
Chloride: 102 mmol/L (ref 98–111)
Creatinine, Ser: 0.57 mg/dL (ref 0.44–1.00)
GFR, Estimated: >60 mL/min (ref >60)
Glucose, Bld: 122 mg/dL — ABNORMAL HIGH (ref 70–99)
Potassium: 3.8 mmol/L (ref 3.5–5.1)
Sodium: 136 mmol/L (ref 135–145)

## 2023-03-11 LAB — CBC WITH DIFFERENTIAL/PLATELET
Abs Immature Granulocytes: 0.06 10*3/uL (ref 0.00–0.07)
Basophils Absolute: 0 10*3/uL (ref 0.0–0.1)
Basophils Relative: 0 %
Eosinophils Absolute: 0.1 10*3/uL (ref 0.0–0.5)
Eosinophils Relative: 1 %
HCT: 39.8 % (ref 36.0–46.0)
Hemoglobin: 12.3 g/dL (ref 12.0–15.0)
Immature Granulocytes: 1 %
Lymphocytes Relative: 7 %
Lymphs Abs: 0.7 10*3/uL (ref 0.7–4.0)
MCH: 30.1 pg (ref 26.0–34.0)
MCHC: 30.9 g/dL (ref 30.0–36.0)
MCV: 97.5 fL (ref 80.0–100.0)
Monocytes Absolute: 0.7 10*3/uL (ref 0.1–1.0)
Monocytes Relative: 7 %
Neutro Abs: 7.7 10*3/uL (ref 1.7–7.7)
Neutrophils Relative %: 84 %
Platelets: 157 10*3/uL (ref 150–400)
RBC: 4.08 MIL/uL (ref 3.87–5.11)
RDW: 15.4 % (ref 11.5–15.5)
WBC: 9.1 10*3/uL (ref 4.0–10.5)
nRBC: 0 % (ref 0.0–0.2)

## 2023-03-11 MED ORDER — METHYLPREDNISOLONE 4 MG PO TABS
40.0000 mg | ORAL_TABLET | Freq: Every day | ORAL | Status: AC
  Administered 2023-03-12 – 2023-03-14 (×3): 40 mg via ORAL
  Filled 2023-03-11 (×3): qty 2

## 2023-03-11 MED ORDER — PREDNISONE 50 MG PO TABS: 50.0000 mg | ORAL_TABLET | Freq: Every day | ORAL | Status: DC

## 2023-03-11 NOTE — Progress Notes (Signed)
Nutrition Follow-up  DOCUMENTATION CODES:   Non-severe (moderate) malnutrition in context of chronic illness  INTERVENTION:  - Regular diet.  - Continue Ensure Plus High Protein po BID, each supplement provides 350 kcal and 20 grams of protein. - Encourage intake at all meals and of supplements.  - Monitor weight trends.   NUTRITION DIAGNOSIS:   Moderate Malnutrition related to chronic illness (stage IV adenocarcinoma of lung) as evidenced by mild fat depletion, mild muscle depletion. 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 Admitted 9/21 with dyspnea, hypoxemia, lethargy and low-grade fever.  Patient endorses appetite remains good and that she has been eating well. Shares she has been ordering 3 meals a day and eating as much as she can of them. Documented to be consuming 50-100% of meals. She has also been drinking Ensure 1-2 times a day and reports enjoying it.  Encouraged patient to continue to eat well and consume supplements. Also reiterated importance of protein rich food sources at all meals.  Patient remains medically stable for discharge since 9/27, awaiting placement.    Medications reviewed and include: 1mg  folic acid, Remeron, Senokot  Labs reviewed:  -   Diet Order:   Diet Order             Diet regular Room service appropriate? Yes; Fluid consistency: Thin  Diet effective now                   EDUCATION NEEDS:  Education needs have been addressed  Skin:  Skin Assessment: Reviewed RN Assessment  Last BM:  9/30  Height:  Ht  Readings from Last 1 Encounters:  03/01/23 5\' 3"  (1.6 m)   Weight:  Wt Readings from Last 1 Encounters:  03/01/23 66.8 kg    BMI:  Body mass index is 26.09 kg/m.  Estimated Nutritional Needs:  Kcal:  1900-2150 kcals Protein:  80-100 grams Fluid:  >/= 1.9L    Shelle Iron RD, LDN For contact information, refer to [facility].

## 2023-03-11 NOTE — Progress Notes (Signed)
Mobility Specialist - Progress Note  Pre-mobility: 97 bpm HR, 92% SpO2 During mobility: 123 bpm HR, 82% SpO2 Post-mobility: 101 bpm HR, 91% SPO2   03/11/23 0851  Mobility  Activity Ambulated with assistance in hallway  Level of Assistance Contact guard assist, steadying assist  Assistive Device Front wheel walker;Other (Comment) (chair follow)  Distance Ambulated (ft) 94 ft  Range of Motion/Exercises Active  Activity Response Tolerated fair  $Mobility charge 1 Mobility  Mobility Specialist Start Time (ACUTE ONLY) 0830  Mobility Specialist Stop Time (ACUTE ONLY) 0851  Mobility Specialist Time Calculation (min) (ACUTE ONLY) 21 min   Pt was found in bed and agreeable to ambulate. NT in room and assisted with chair follow. Pt had x1 seated rest break at ~48ft. Stated feeling weaker than her baseline. Pt returned to room and SPO2 decreased to 82%. Pt able to increase SPO2 >90% in ~90min. Pt was left in bed with all needs met. Call bell in reach and bed alarm on.  Billey Chang Mobility Specialist

## 2023-03-11 NOTE — Progress Notes (Signed)
PROGRESS NOTE    Hayley Wu  XBM:[id number] DOB: 03-07-1951 DOA: 03/01/2023 PCP: [name], PA-C   Brief Narrative:  72 yof w/ history significant of seasonal allergies, hypertension, tobacco use, hypothyroidism, constipation 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 Patient also having cough occasionally productive, mild wheezing.  She has been prescribed mirtazapine recently for low appetite. In the ED: Hypoxic 50% on room air placed on nonrebreather.  Labs showed hypokalemia stable renal function albumin 2.8 Pro-Cal less than 0.1 lactic acid 0.9 BNP 152 CBC with hemoglobin 10.8 influenza COVID-19 screen negative, UA with WBC more than 50 large leukocytes. chest x-ray>> bilateral opacities right more than left concerning for pneumonia. CTA>> occluded left lower lobe pulmonary artery branch since 7/14, stable collapse of T7 with paraspinal soft tissue density and acute kyphosis, interval change of CHF, cardiomegaly interstitial pulmonary edema minimal right pleural effusion known primary lung carcinoma 2.9 cm right lower lobe stable and large lymph nodes, stable left humeral head sclerotic metastasis or infarct, COPD. Patient was wheezing and having rales on exam.  Placed on empiric antibiotic and admitted PCCM was consulted due to worsening hypoxia, echo obtained shows EF 55 to 60% G1 DD normal RV systolic function mitral valve normal aortic valve tricuspid. 9/22 Started on a steroid trial and will continue with empiric antibiotics.  Echocardiogram normal LV RV function.  Assessment & Plan:   Principal Problem:   Acute respiratory failure with hypoxia (HCC) Active Problems:   Hypokalemia   Hypertension   Hypothyroidism   Adenocarcinoma of right lung, stage 4 (HCC)   Constipation    Normocytic anemia   Moderate protein malnutrition (HCC)   Occlusion of left pulmonary artery (HCC)   Cardiomegaly   Pleural effusion due to CHF (congestive heart failure) (HCC)  Acute respiratory distress with acute hypoxic respiratory failure secondary to multifocal bilateral bronchopneumonia/pulmonary infiltrates Adenocarcinoma of right lung stage IV: Imaging shows bony spinal mets pulmonary nodules and lymphadenopathy along with bilateral multifocal opacities Differentials:pneumonia, opportunistic infections, pneumonitis due to either radiation or immunotherapy.BNP was up however echo with normal LV RV function got few doses of IV Lasix initially. PCCM managing appreciate input doing high-dose steroid trial with 125 mg x 3 days.  Patient on Solu-Medrol 60 mg IV daily since 03/05/2023 per PCCM recommendation, they also recommend that patient will eventually need prednisone for 3 weeks.  She has been here for 10 days, I have started to taper and has started her on prednisone 50 mg p.o. daily starting tomorrow.  At discharge, this should be weaned to every 3 days.  Patient has been weaned down to 1.5 L since 03/07/2023, upon ambulatory oximetry checking on 03/09/2023, she was observed to be requiring 3 L of oxygen to keep oxygen saturation over 92%.  She was assessed by PT OT who recommended SNF.  TOC has been consulted.  Waiting for insurance authorization. Continue supplemental oxygen bronchodilators, anticoagulant Eliquis.  Since procalcitonin negative-antibiotic  stopped 9/24 Dr Arbutus Ped aware of admission and agree w/ above management> per PCCM depending upon workup may need to consider change from her current immunotherapy.   Cardiomegaly: But TTE normal.BNP elevated some 162>264   Anemia Mld drop likley dilutional stable and up now  Acute bilateral LE DVT left common femoral and left proximal profunda vein Newly identified subacute left pulmonary  embolism: Ct Occluded LLL pulm artery was  since July.As planned per oncology-started on Eliquis therapeutic dose    Hypertension: BP well-controlled, meds on hold since admission.  May need to consider discharging without any antihypertensives.   Hypothyroidism: Continue levothyroxine   Hypokalemia: Resolved   Constipation: Continue stool softener, last BM 9/19   Protein malnutrition moderate: Augment diet, continue Remeron at bedtime Nutrition Status: Goals of care: Previous hospitalist had discussed CODE STATUS with the patient 9/22 and she wished to be DNR no CPR or intubation, patient's 2 daughters at the bedside In agreement.  DVT prophylaxis: Eliquis   Code Status: Limited: Do not attempt resuscitation (DNR) -DNR-LIMITED -Do Not Intubate/DNI   Family Communication: None present at bedside.  Plan of care discussed with patient in length and he/she verbalized understanding and agreed with it.    Status is: Inpatient Remains inpatient appropriate because: Medically stable since 03/07/2023.  TOC consulted.  Pending placement   Estimated body mass index is 27.49 kg/m as calculated from the following:   Height as of this encounter: 5\' 3"  (1.6 m).   Weight as of this encounter: 70.4 kg.    Nutritional Assessment: Body mass index is 27.49 kg/m.Marland Kitchen Seen by dietician.  I agree with the assessment and plan as outlined below: Nutrition Status: Nutrition Problem: Moderate Malnutrition Etiology: chronic illness (stage IV adenocarcinoma of lung) Signs/Symptoms: mild fat depletion, mild muscle depletion Interventions: Refer to RD note for recommendations, Ensure Enlive (each supplement provides 350kcal and 20 grams of protein)  . Skin Assessment: I have examined the patient's skin and I agree with the wound assessment as performed by the wound care RN as outlined below:    Consultants:  Oncology-signed off Pulmonology  Procedures:  As above  Antimicrobials:  Anti-infectives (From admission, onward)    Start      Dose/Rate Route Frequency Ordered Stop   03/01/23 1015  azithromycin (ZITHROMAX) 500 mg in sodium chloride 0.9 % 250 mL IVPB  Status:  Discontinued        500 mg 250 mL/hr over 60 Minutes Intravenous Every 24 hours 03/01/23 1009 03/04/23 0856   03/01/23 1015  cefTRIAXone (ROCEPHIN) 1 g in sodium chloride 0.9 % 100 mL IVPB  Status:  Discontinued        1 g 200 mL/hr over 30 Minutes Intravenous Every 24 hours 03/01/23 1009 03/04/23 0856         Subjective: Seen and examined.  No complaints.  Encouraged to continue to use incentive spirometry every hour.  Objective: Vitals:   03/10/23 1952 03/11/23 0411 03/11/23 1256 03/11/23 1318  BP: (!) 119/59 131/71 127/78   Pulse: 87 73 89   Resp: 18 18 16    Temp: 99 F (37.2 C) 98.7 F (37.1 C) 98.8 F (37.1 C)   TempSrc: Oral Oral Oral   SpO2: 94% 93% 94%   Weight:    70.4 kg  Height:        Intake/Output Summary (Last 24 hours) at 03/11/2023 1529 Last data filed at 03/11/2023 9147 Gross per 24 hour  Intake 480 ml  Output 1975 ml  Net -1495 ml   Filed Weights   03/01/23 0844 03/01/23 2000 03/11/23 1318  Weight: 67.1 kg 66.8 kg 70.4 kg    Examination:  General exam: Appears calm and comfortable  Respiratory system: Rales bilateral. Respiratory effort normal. Cardiovascular system: S1 & S2 heard, RRR. No JVD, murmurs, rubs, gallops or clicks. No pedal edema. Gastrointestinal system: Abdomen is nondistended, soft and  nontender. No organomegaly or masses felt. Normal bowel sounds heard. Central nervous system: Alert and oriented. No focal neurological deficits. Extremities: Symmetric 5 x 5 power. Skin: No rashes, lesions or ulcers.  Psychiatry: Judgement and insight appear normal. Mood & affect appropriate.   Data Reviewed: I have personally reviewed following labs and imaging studies  CBC: Recent Labs  Lab 03/05/23 0628 03/06/23 0617 03/07/23 1009 03/11/23 0806  WBC 8.0 7.4 7.6 9.1  NEUTROABS  --   --   --  7.7  HGB  12.3 12.0 13.0 12.3  HCT 39.7 38.8 42.0 39.8  MCV 96.6 99.5 97.4 97.5  PLT 169 155 171 157   Basic Metabolic Panel: Recent Labs  Lab 03/05/23 0628 03/06/23 0617 03/07/23 1009 03/11/23 0806  NA 138 137 137 136  K 4.3 4.3 4.0 3.8  CL 102 103 102 102  CO2 27 29 27 26   GLUCOSE 148* 104* 100* 122*  BUN 22 23 21 20   CREATININE 0.49 0.42* 0.55 0.57  CALCIUM 8.6* 8.5* 8.6* 8.5*   GFR: Estimated Creatinine Clearance: 61.6 mL/min (by C-G formula based on SCr of 0.57 mg/dL). Liver Function Tests: No results for input(s): "AST", "ALT", "ALKPHOS", "BILITOT", "PROT", "ALBUMIN" in the last 168 hours.  No results for input(s): "LIPASE", "AMYLASE" in the last 168 hours. No results for input(s): "AMMONIA" in the last 168 hours. Coagulation Profile: No results for input(s): "INR", "PROTIME" in the last 168 hours.  Cardiac Enzymes: No results for input(s): "CKTOTAL", "CKMB", "CKMBINDEX", "TROPONINI" in the last 168 hours. BNP (last 3 results) No results for input(s): "PROBNP" in the last 8760 hours. HbA1C: No results for input(s): "HGBA1C" in the last 72 hours. CBG: No results for input(s): "GLUCAP" in the last 168 hours.  Lipid Profile: No results for input(s): "CHOL", "HDL", "LDLCALC", "TRIG", "CHOLHDL", "LDLDIRECT" in the last 72 hours. Thyroid Function Tests: No results for input(s): "TSH", "T4TOTAL", "FREET4", "T3FREE", "THYROIDAB" in the last 72 hours. Anemia Panel: No results for input(s): "VITAMINB12", "FOLATE", "FERRITIN", "TIBC", "IRON", "RETICCTPCT" in the last 72 hours. Sepsis Labs: No results for input(s): "PROCALCITON", "LATICACIDVEN" in the last 168 hours.   Recent Results (from the past 240 hour(s))  MRSA Next Gen by PCR, Nasal     Status: None   Collection Time: 03/01/23  9:50 PM   Specimen: Nasal Mucosa; Nasal Swab  Result Value Ref Range Status   MRSA by PCR Next Gen NOT DETECTED NOT DETECTED Final    Comment: (NOTE) The GeneXpert MRSA Assay (FDA approved for  NASAL specimens only), is one component of a comprehensive MRSA colonization surveillance program. It is not intended to diagnose MRSA infection nor to guide or monitor treatment for MRSA infections. Test performance is not FDA approved in patients less than 49 years old. Performed at [facility], [address] [place], [place] [address]      Radiology Studies: No results found.  Scheduled Meds:  apixaban  5 mg Oral BID   Chlorhexidine Gluconate Cloth  6 each Topical QHS   feeding supplement  237 mL Oral BID BM   folic acid  1 mg Oral Daily   levothyroxine  100 mcg Oral Q0600   methylPREDNISolone (SOLU-MEDROL) injection  60 mg Intravenous Q24H   mirtazapine  30 mg Oral QHS   senna  1 tablet Oral Daily   Continuous Infusions:   LOS: 10 days   Hughie Closs, MD [facility]  03/11/2023, 3:29 PM   *Please note that this is a  verbal dictation therefore any spelling or grammatical errors are due to the "Dragon [employer] One" system interpretation.  Please page via Amion and do not message via secure chat for urgent patient care matters. Secure chat can be used for non urgent patient care matters.  How to contact the [facility] Attending or Consulting provider 7A - 7P or covering provider during after hours 7P -7A, for this patient?  Check the care team in [facility] and look for a) attending/consulting [facility] provider listed and b) the [facility] team listed. Page or secure chat 7A-7P. Log into [email or web address] and use [place]'s universal password to access. If you do not have the password, please contact the hospital operator. Locate the [facility] provider you are looking for under [facility] and page to a number that you can be directly reached. If you still have difficulty reaching the provider, please page the [facility] (Director on Call) for the Hospitalists listed on amion for assistance.

## 2023-03-11 NOTE — Progress Notes (Signed)
Occupational Therapy Treatment Patient Details Name: Hayley Wu MRN: [id number] DOB: 11-Jul-1950 Today's Date: 03/11/2023   History of present illness Pt is 72 yo female admitted on 03/05/23 with acute respiratory failure and found to have new bil pulmonary infiltrates.  Pt with bil acute DVT started on Eliquis and subacute L PE.  Pt with hx including but not limited to stage IV adenocarcinoma lung, hepatic and bony metastases that required T7 laminectomy due to spinal cord compression 09/2022   OT comments  The pt was motivated to participate in the therapy session, with the session emphasizing functional strengthening and endurance training, needed to facilitate progressive ADL performance. She was noted to  be on 2L O2 via nasal cannula. Her O2 saturation was 94-95% on 2L at rest and 92% on 2L with activity. She was able to perform bed mobility, lower body dressing seated EOB, sit to stand using a RW, and ambulation using a RW. She required 1 seated therapeutic rest break. She reported feelings of B LE weakness with feelings of partial numbness. Continue OT plan of care. Patient will benefit from continued inpatient follow up therapy, <3 hours/day.       If plan is discharge home, recommend the following:  A little help with walking and/or transfers;Assist for transportation;Assistance with cooking/housework;A little help with bathing/dressing/bathroom   Equipment Recommendations  Other (comment) (defer to next level of chair)    Recommendations for Other Services      Precautions / Restrictions Precautions Precaution Comments: monitor sats Restrictions Weight Bearing Restrictions: No       Mobility Bed Mobility Overal bed mobility: Needs Assistance Bed Mobility: Supine to Sit, Sit to Supine     Supine to sit: Supervision, HOB elevated, Used rails Sit to supine: Supervision, HOB elevated        Transfers Overall transfer level: Needs assistance Equipment used: Rolling  walker (2 wheels) Transfers: Sit to/from Stand Sit to Stand: Contact guard assist                     ADL either performed or assessed with clinical judgement   ADL Overall ADL's : Needs assistance/impaired Eating/Feeding: Independent;Sitting   Grooming: Set up;Sitting       Lower Body Bathing: Moderate assistance;Sit to/from stand Lower Body Bathing Details (indicate cue type and reason): based on clinical judgement Upper Body Dressing : Set up;Sitting   Lower Body Dressing: Moderate assistance;Sitting/lateral leans   Toilet Transfer: Minimal assistance;Contact guard assist;Rolling walker (2 wheels);Ambulation                   Cognition Arousal: Alert Behavior During Therapy: WFL for tasks assessed/performed Overall Cognitive Status: Within Functional Limits for tasks assessed      General Comments: Friendly, able to follow commands without difficulty                   Pertinent Vitals/ Pain       Pain Assessment Pain Assessment: No/denies pain         Frequency  Min 1X/week        Progress Toward Goals  OT Goals(current goals can now be found in the care plan section)  Progress towards OT goals: Progressing toward goals  Acute Rehab OT Goals Patient Stated Goal: to return home and to be as independent as possible OT Goal Formulation: With patient Time For Goal Achievement: 03/21/23 Potential to Achieve Goals: Good  Plan         AM-PAC  OT "6 Clicks" Daily Activity     Outcome Measure   Help from another person eating meals?: None Help from another person taking care of personal grooming?: A Little Help from another person toileting, which includes using toliet, bedpan, or urinal?: A Little Help from another person bathing (including washing, rinsing, drying)?: A Lot Help from another person to put on and taking off regular upper body clothing?: A Little Help from another person to put on and taking off regular lower body  clothing?: A Little 6 Click Score: 18    End of Session Equipment Utilized During Treatment: Rolling walker (2 wheels);Oxygen  OT Visit Diagnosis: Unsteadiness on feet (R26.81);Muscle weakness (generalized) (M62.81)   Activity Tolerance Patient tolerated treatment well   Patient Left in bed;with call bell/phone within reach;with family/visitor present   Nurse Communication Mobility status        Time: 9147-8295 OT Time Calculation (min): 21 min  Charges: OT General Charges $OT Visit: 1 Visit OT Treatments $Therapeutic Activity: 8-22 mins     Reuben Likes, OTR/L 03/11/2023, 4:48 PM

## 2023-03-12 DIAGNOSIS — E039 Hypothyroidism, unspecified: Secondary | ICD-10-CM | POA: Diagnosis not present

## 2023-03-12 DIAGNOSIS — D649 Anemia, unspecified: Secondary | ICD-10-CM | POA: Diagnosis not present

## 2023-03-12 DIAGNOSIS — J9601 Acute respiratory failure with hypoxia: Secondary | ICD-10-CM | POA: Diagnosis not present

## 2023-03-12 DIAGNOSIS — I10 Essential (primary) hypertension: Secondary | ICD-10-CM | POA: Diagnosis not present

## 2023-03-12 NOTE — TOC Progression Note (Signed)
Transition of Care [facility]) - Progression Note    Patient Details  Name: Hayley Wu MRN: [id number] Date of Birth: 12-02-1950  Transition of [facility] CM/SW Contact  Howell Rucks, RN Phone Number: 03/12/2023, 4:07 PM  Clinical Narrative: NCM called to Tammy with [facility] to check on status on short term rehab/SNF auth, per Tammy, a vm was left for NCM (me) at 12 noon today offer a peer to peer, Tammy confirmed nurse following case Judeth Cornfield) has NCM incorrect contact number. NCM called to [place] at [facility] at [phone],informed Judeth Cornfield that she has incorrect contact number and peer to peer request was never received. Judeth Cornfield offered Peer to Peer, contact Dr Lovena Le at [phone] by 03/13/2023, 9am, attending notified. Judeth Cornfield provided correct number for NCM        Expected Discharge Plan and Services                                               Social Determinants of Health (SDOH) Interventions SDOH Screenings   Food Insecurity: No Food Insecurity (03/01/2023)  Housing: Low Risk  (03/01/2023)  Transportation Needs: No Transportation Needs (03/01/2023)  Utilities: Not At Risk (03/01/2023)  Depression (PHQ2-9): Low Risk  (10/02/2022)  Tobacco Use: High Risk (03/01/2023)    Readmission Risk Interventions    03/04/2023    6:23 PM  Readmission Risk Prevention Plan  Transportation Screening Complete  PCP or Specialist Appt within 3-5 Days Complete  HRI or Home Care Consult Complete  Social Work Consult for Recovery Care Planning/Counseling Complete  Palliative Care Screening Not Applicable  Medication Review Oceanographer) Complete

## 2023-03-12 NOTE — Progress Notes (Signed)
[facility]                                                                               Hayley Wu, is a 72 y.o. 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 Patient also having cough occasionally productive, mild wheezing.  She has been prescribed mirtazapine recently for low appetite. In the ED: Hypoxic 50% on room air placed on nonrebreather.  Labs showed hypokalemia stable renal function albumin 2.8 Pro-Cal less than 0.1 lactic acid 0.9 BNP 152 CBC with hemoglobin 10.8 influenza COVID-19 screen negative, UA with WBC more than 50 large leukocytes. chest x-ray>> bilateral opacities right more than left concerning for pneumonia. CTA>> occluded left lower lobe pulmonary artery branch since 7/14, stable collapse of T7 with paraspinal soft tissue density and acute kyphosis, interval change of CHF, cardiomegaly interstitial pulmonary edema minimal right pleural effusion known primary lung carcinoma 2.9 cm right lower lobe stable and large lymph nodes, stable left humeral head sclerotic metastasis or infarct, COPD. Patient was wheezing and having rales on exam.  Placed on empiric antibiotic and admitted PCCM was consulted due to worsening hypoxia, echo obtained shows EF 55 to 60% G1 DD normal RV systolic function mitral valve normal aortic valve tricuspid. 9/22 Started on a steroid trial and will continue with empiric antibiotics.  Echocardiogram normal LV RV function.   Assessment & Plan    Assessment and Plan:      Acute respiratory distress with acute hypoxic respiratory failure secondary to multifocal bilateral bronchopneumonia/pulmonary infiltrates Adenocarcinoma of right lung stage IV: Imaging shows bony spinal mets pulmonary nodules and lymphadenopathy along with bilateral multifocal opacities Differentials:pneumonia, opportunistic infections, pneumonitis due to either radiation or immunotherapy.BNP was up however echo with normal LV RV function got few doses of IV Lasix initially. PCCM managing appreciate input doing high-dose steroid trial with 125 mg x 3 days.  Patient on Solu-Medrol 60 mg IV daily since 03/05/2023 per PCCM recommendation, they also recommend that patient will eventually need prednisone for 3 weeks.  She has been here for 10 days, I have started to taper and has started her on prednisone 50 mg p.o. daily starting tomorrow.  At discharge, this should be weaned to every 3 days.  Patient has been weaned down to 1.5 L since 03/07/2023, upon ambulatory oximetry checking on 03/09/2023, she was observed to be requiring 3 L of oxygen to keep oxygen saturation over 92%.  She was assessed by PT OT who recommended SNF.  TOC has been consulted.  Waiting for insurance authorization. Continue supplemental oxygen bronchodilators, anticoagulant Eliquis.  Since procalcitonin negative-antibiotic  stopped 9/24 Dr Arbutus Ped aware of admission and agree w/ above management> per PCCM depending upon workup may need to  consider change from her current immunotherapy.   Cardiomegaly: But TTE normal.BNP elevated some 162>264   Anemia Mld drop likley dilutional stable and up now   Acute bilateral LE DVT left common femoral and left proximal profunda vein Newly identified subacute left pulmonary embolism: Ct Occluded LLL pulm artery was since July.As planned per oncology-started on Eliquis therapeutic dose    Hypertension: BP well-controlled, meds on hold since admission.  May need to consider discharging without  any antihypertensives.   Hypothyroidism: Continue levothyroxine   Hypokalemia: Resolved, repeat K WNL.    Constipation: Continue stool softener, last BM 9/19   Protein malnutrition moderate: Augment diet, continue Remeron at bedtime Nutrition Status: Goals of care: Previous hospitalist had discussed CODE STATUS with the patient 9/22 and she wished to be DNR no CPR or intubation, patient's 2 daughters at the bedside In agreement.     RN Pressure Injury Documentation:    Malnutrition Type:  Nutrition Problem: Moderate Malnutrition Etiology: chronic illness (stage IV adenocarcinoma of lung)   Malnutrition Characteristics:  Signs/Symptoms: mild fat depletion, mild muscle depletion   Nutrition Interventions:  Interventions: Refer to RD note for recommendations, Ensure Enlive (each supplement provides 350kcal and 20 grams of protein)  Estimated body mass index is 27.49 kg/m as calculated from the following:   Height as of this encounter: 5\' 3"  (1.6 m).   Weight as of this encounter: 70.4 kg.  Code Status: DNR limited.  DVT Prophylaxis:   apixaban (ELIQUIS) tablet 5 mg   Level of Care: Level of care: Progressive Family Communication: none at bedside.   Disposition Plan:     Remains inpatient appropriate:  waiting for SNF.   Procedures:  None.   Consultants:   None.  Antimicrobials:   Anti-infectives (From admission, onward)    Start     Dose/Rate Route Frequency Ordered Stop   03/01/23 1015  azithromycin (ZITHROMAX) 500 mg in sodium chloride 0.9 % 250 mL IVPB  Status:  Discontinued        500 mg 250 mL/hr over 60 Minutes Intravenous Every 24 hours 03/01/23 1009 03/04/23 0856   03/01/23 1015  cefTRIAXone (ROCEPHIN) 1 g in sodium chloride 0.9 % 100 mL IVPB  Status:  Discontinued        1 g 200 mL/hr over 30 Minutes Intravenous Every 24 hours 03/01/23 1009 03/04/23 0856        Medications  Scheduled Meds:  apixaban  5 mg Oral BID   Chlorhexidine  Gluconate Cloth  6 each Topical QHS   feeding supplement  237 mL Oral BID BM   folic acid  1 mg Oral Daily   levothyroxine  100 mcg Oral Q0600   methylPREDNISolone  40 mg Oral Daily   mirtazapine  30 mg Oral QHS   senna  1 tablet Oral Daily   Continuous Infusions: PRN Meds:.acetaminophen **OR** acetaminophen, albuterol, ondansetron **OR** ondansetron (ZOFRAN) IV, mouth rinse, polyvinyl alcohol, sodium chloride flush    Subjective:   Taffany Gerwin was seen and examined today.  No new complaints. Waiting for snf.   Objective:   Vitals:   03/11/23 1318 03/11/23 1943 03/12/23 0455 03/12/23 1330  BP:  118/62 (!) 146/77 124/73  Pulse:  86 75 94  Resp:  18 18 20   Temp:  98.6 F (37 C) 97.7 F (36.5 C) 98.4 F (36.9 C)  TempSrc:  Oral Oral Oral  SpO2:  96% 93% 94%  Weight: 70.4 kg     Height:  Intake/Output Summary (Last 24 hours) at 03/12/2023 1416 Last data filed at 03/11/2023 1500 Gross per 24 hour  Intake 480 ml  Output --  Net 480 ml   Filed Weights   03/01/23 0844 03/01/23 2000 03/11/23 1318  Weight: 67.1 kg 66.8 kg 70.4 kg     Exam General exam: Appears calm and comfortable  Respiratory system: Clear to auscultation. Respiratory effort normal. Cardiovascular system: S1 & S2 heard, RRR. Gastrointestinal system: Abdomen is nondistended, soft and nontender.  Central nervous system: Alert and oriented. No focal neurological deficits. Extremities: Symmetric 5 x 5 power. Skin: No rashes,  Psychiatry: Mood & affect appropriate.     Data Reviewed:  I have personally reviewed following labs and imaging studies   CBC Lab Results  Component Value Date   WBC 9.1 03/11/2023   RBC 4.08 03/11/2023   HGB 12.3 03/11/2023   HCT 39.8 03/11/2023   MCV 97.5 03/11/2023   MCH 30.1 03/11/2023   PLT 157 03/11/2023   MCHC 30.9 03/11/2023   RDW 15.4 03/11/2023   LYMPHSABS 0.7 03/11/2023   MONOABS 0.7 03/11/2023   EOSABS 0.1 03/11/2023   BASOSABS 0.0 03/11/2023      Last metabolic panel Lab Results  Component Value Date   NA 136 03/11/2023   K 3.8 03/11/2023   CL 102 03/11/2023   CO2 26 03/11/2023   BUN 20 03/11/2023   CREATININE 0.57 03/11/2023   GLUCOSE 122 (H) 03/11/2023   GFRNONAA >60 03/11/2023   GFRAA >90 11/25/2013   CALCIUM 8.5 (L) 03/11/2023   PROT 5.8 (L) 03/02/2023   ALBUMIN 3.3 (L) 03/02/2023   BILITOT 1.0 03/02/2023   ALKPHOS 59 03/02/2023   AST 17 03/02/2023   ALT 13 03/02/2023   ANIONGAP 8 03/11/2023    CBG (last 3)  No results for input(s): "GLUCAP" in the last 72 hours.    Coagulation Profile: No results for input(s): "INR", "PROTIME" in the last 168 hours.   Radiology Studies: No results found.     Kathlen Mody M.D. [facility] 03/12/2023, 2:16 PM  Available via Epic secure chat 7am-7pm After 7 pm, please refer to night coverage provider listed on amion.

## 2023-03-13 DIAGNOSIS — I10 Essential (primary) hypertension: Secondary | ICD-10-CM | POA: Diagnosis not present

## 2023-03-13 DIAGNOSIS — Z7189 Other specified counseling: Secondary | ICD-10-CM

## 2023-03-13 DIAGNOSIS — D649 Anemia, unspecified: Secondary | ICD-10-CM | POA: Diagnosis not present

## 2023-03-13 DIAGNOSIS — E039 Hypothyroidism, unspecified: Secondary | ICD-10-CM | POA: Diagnosis not present

## 2023-03-13 DIAGNOSIS — Z515 Encounter for palliative care: Secondary | ICD-10-CM | POA: Diagnosis not present

## 2023-03-13 DIAGNOSIS — J9601 Acute respiratory failure with hypoxia: Secondary | ICD-10-CM | POA: Diagnosis not present

## 2023-03-13 NOTE — Progress Notes (Signed)
Physical Therapy Treatment Patient Details Name: Hayley Wu MRN: [id number] DOB: 06-15-1950 Today's Date: 03/13/2023   History of Present Illness Pt is 72 yo female admitted on 03/05/23 with acute respiratory failure and found to have new bil pulmonary infiltrates.  Pt with bil acute DVT started on Eliquis and subacute L PE.  Pt with hx including but not limited to stage IV adenocarcinoma lung, hepatic and bony metastases that required T7 laminectomy due to spinal cord compression 09/2022    PT Comments  Pt is AxO x 3 pleasant in bed on 2 lts oxygen with sats at 94% and HR 78.  First assisted to [facility].  General bed mobility comments: pt able to self move B LE in and out of bed but requires a rest break between each activity due to dyspnea. General transfer comment: pt able to self rise but requires extra support using B UE's (heavy lean) and able to transfer to near by [facility] but fatigues easily requiring an extended rest break after. General Gait Details: decreased amb distance this session due to fatigue and present with 2/4 dyspnea.  HR increased to 119 and RR 34.  "I need to sit down", reported pt.  Pt required an extended seated rest break before attempting another activity.  VERY limited activity tolerance.  Posture and balance are also poor.  Excessive lean on walker for support is causing her posture to be forward flex.  Pt also noted to present with increased balance instability with increased activity esp with turns and back steps back to bed.  Pt tends to sit quickly.  Deconditioned.  HIGH FALL RISK.  SATURATION QUALIFICATIONS: (This note is used to comply with regulatory documentation for home oxygen)  Patient Saturations on Room Air at Rest = 91%  Patient Saturations on Room Air while Ambulating  38 feet = 84%  Patient Saturations on 2 Liters of oxygen while Ambulating = 90% HR 119 RR 34  Please briefly explain why patient needs home oxygen:  Pt requires supplemental oxygen to achieve  therapeutic levels  Pt is deconditioned, HIGH FALL RISK and HIGH risk for hospital readmission. Pwill need ST Rehab at SNF to address mobility and functional decline prior to safely returning home.    If plan is discharge home, recommend the following: A little help with walking and/or transfers;A little help with bathing/dressing/bathroom;Assistance with cooking/housework;Help with stairs or ramp for entrance   Can travel by private vehicle     Yes  Equipment Recommendations  None recommended by PT    Recommendations for Other Services       Precautions / Restrictions Precautions Precautions: Fall Precaution Comments: monitor sats Restrictions Weight Bearing Restrictions: No     Mobility  Bed Mobility Overal bed mobility: Needs Assistance Bed Mobility: Supine to Sit, Sit to Supine     Supine to sit: Supervision Sit to supine: Supervision   General bed mobility comments: pt able to self move B LE in and out of bed but requires a rest break between each activity due to dyspnea.    Transfers Overall transfer level: Needs assistance Equipment used: Rolling walker (2 wheels) Transfers: Sit to/from Stand Sit to Stand: Contact guard assist           General transfer comment: pt able to self rise but requires extra support using B UE's (heavy lean) and able to transfer to near by [facility] but fatigues easily requiring an extended rest break after.    Ambulation/Gait Ambulation/Gait assistance: Min assist, +2  safety/equipment Gait Distance (Feet): 38 Feet Assistive device: Rolling walker (2 wheels) Gait Pattern/deviations: Step-to pattern, Decreased stride length, Trunk flexed, Ataxic Gait velocity: decreased     General Gait Details: decreased amb distance this session due to fatigue and present with 2/4 dyspnea.  HR increased to 119 and RR 38.  "I need to sit down", reported pt.  Pt required an extended seated rest break before attempting another activity.  VERY limited  activity tolerance.  Posture and balance are also poor.  Excessive lean on walker for support is causing her posture to be forward flex.  Pt also noted to present with increased balance instability with increased activity esp with turns and back steps back to bed.  Pt tends to sit quickly.  Deconditioned.  HIGH FALL RISK.   Stairs             Wheelchair Mobility     Tilt Bed    Modified Rankin (Stroke Patients Only)       Balance                                            Cognition Arousal: Alert Behavior During Therapy: WFL for tasks assessed/performed Overall Cognitive Status: Within Functional Limits for tasks assessed                                 General Comments: AxO x 3 pleasant Lady        Exercises      General Comments        Pertinent Vitals/Pain Pain Assessment Pain Assessment: No/denies pain    Home Living                          Prior Function            PT Goals (current goals can now be found in the care plan section) Progress towards PT goals: Progressing toward goals    Frequency    Min 1X/week      PT Plan      Co-evaluation              AM-PAC PT "6 Clicks" Mobility   Outcome Measure  Help needed turning from your back to your side while in a flat bed without using bedrails?: A Little Help needed moving from lying on your back to sitting on the side of a flat bed without using bedrails?: A Little Help needed moving to and from a bed to a chair (including a wheelchair)?: A Little Help needed standing up from a chair using your arms (e.g., wheelchair or bedside chair)?: A Lot Help needed to walk in hospital room?: A Lot Help needed climbing 3-5 steps with a railing? : A Lot 6 Click Score: 15    End of Session Equipment Utilized During Treatment: Gait belt Activity Tolerance: Patient limited by fatigue Patient left: in bed;with call bell/phone within reach Nurse  Communication: Mobility status PT Visit Diagnosis: Other abnormalities of gait and mobility (R26.89)     Time: 1049-1105 PT Time Calculation (min) (ACUTE ONLY): 16 min  Charges:    $Gait Training: 8-22 mins PT General Charges $$ ACUTE PT VISIT: 1 Visit  Felecia Shelling  PTA Acute  Rehabilitation Services Office M-F          [phone]

## 2023-03-13 NOTE — Plan of Care (Signed)
  Problem: Activity: Goal: Risk for activity intolerance will decrease Outcome: Progressing   Problem: Nutrition: Goal: Adequate nutrition will be maintained Outcome: Progressing   Problem: Safety: Goal: Ability to remain free from injury will improve Outcome: Progressing   

## 2023-03-13 NOTE — Consult Note (Signed)
[facility]   [facility] Inpatient Consult   03/13/2023  Hayley Wu [birth year] [id number]   [facility] [[facility]]  Accountable Care Organization [ACO] Patient:  [employer]  Primary Care Provider: [name] with [facility], this provider is listed for the transition of care follow up appointments and calls   [facility] Liaison screened the patient remotely at [facility].   LLOS: 12 days which include a higher level of care  The patient was screened for with noted high risk score for unplanned readmission risk 2 hospital admissions in 6 months.  The patient was assessed for potential needs for post hospital transition for care coordination. Review of patient's electronic medical record reveals patient is being recommended for ST SNF and awaiting a peer to peer with insurance for approval.   Plan: [facility] Liaison will continue to follow progress and disposition to asess for post hospital community care coordination/management needs.  Referral request for community care coordination: will update [facility] provider transition team of any follow up information needs.   Community Care Management/Population Health does not replace or interfere with any arrangements made by the Inpatient Transition of Care team.   For questions contact:   Charlesetta Shanks, RN, BSN, CCM [place]  [facility], [facility] Health [facility] Liaison Direct Dial: [phone] or secure chat Website: [email or web address]

## 2023-03-13 NOTE — Progress Notes (Signed)
[facility]                                                                               Hayley Wu, is a 72 y.o. 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 Patient also having cough occasionally productive, mild wheezing.  She has been prescribed mirtazapine recently for low appetite. In the ED: Hypoxic 50% on room air placed on nonrebreather.  Labs showed hypokalemia stable renal function albumin 2.8 Pro-Cal less than 0.1 lactic acid 0.9 BNP 152 CBC with hemoglobin 10.8 influenza COVID-19 screen negative, UA with WBC more than 50 large leukocytes. chest x-ray>> bilateral opacities right more than left concerning for pneumonia. CTA>> occluded left lower lobe pulmonary artery branch since 7/14, stable collapse of T7 with paraspinal soft tissue density and acute kyphosis, interval change of CHF, cardiomegaly interstitial pulmonary edema minimal right pleural effusion known primary lung carcinoma 2.9 cm right lower lobe stable and large lymph nodes, stable left humeral head sclerotic metastasis or infarct, COPD. Patient was wheezing and having rales on exam.  Placed on empiric antibiotic and admitted PCCM was consulted due to worsening hypoxia, echo obtained shows EF 55 to 60% G1 DD normal RV systolic function mitral valve normal aortic valve tricuspid. 9/22 Started on a steroid trial and will continue with empiric antibiotics.  Echocardiogram normal LV RV function.   Assessment & Plan    Assessment and Plan:      Acute respiratory distress with acute hypoxic respiratory failure secondary to multifocal bilateral bronchopneumonia/pulmonary infiltrates Adenocarcinoma of right lung stage IV: Imaging shows bony spinal mets pulmonary nodules and lymphadenopathy along with bilateral multifocal opacities Differentials:pneumonia, opportunistic infections, pneumonitis due to either radiation or immunotherapy.BNP was up however echo with normal LV RV function got few doses of IV Lasix initially. PCCM managing appreciate input doing high-dose steroid trial with 125 mg x 3 days.  Patient on Solu-Medrol 60 mg IV daily since 03/05/2023 per PCCM recommendation, they also recommend that patient will eventually need prednisone for 3 weeks.  She has been here for 10 days, I have started to taper and has started her on prednisone 50 mg p.o. daily starting tomorrow.  At discharge, this should be weaned to every 3 days.  Patient has been weaned down to 1.5 L since 03/07/2023, upon ambulatory oximetry checking on 03/09/2023, she was observed to be requiring 3 L of oxygen to keep oxygen saturation over 92%.  She was assessed by PT OT who recommended SNF.  TOC has been consulted.  Waiting for insurance authorization. Continue supplemental oxygen bronchodilators, anticoagulant Eliquis.  Since procalcitonin negative-antibiotic  stopped 9/24 Dr Arbutus Ped aware of admission and agree w/ above management> per PCCM depending upon workup may need to  consider change from her current immunotherapy.   Cardiomegaly: ECHO done and reviewed.    Anemia Initial level of 11.4, probably dilutional.  Hemoglobin has been stable around 12.    Acute bilateral LE DVT left common femoral and left proximal profunda vein Newly identified subacute left pulmonary embolism: Ct Occluded LLL pulm artery was since July.As planned per oncology- Continue with eliquis.     Hypertension: Well controlled off BP meds.    Hypothyroidism: Continue levothyroxine    Hypokalemia: Resolved, repeat K WNL.    Constipation: Continue stool softener, last BM 9/19   Protein malnutrition moderate: Augment diet, continue Remeron at bedtime Nutrition Status: Goals of care: Previous hospitalist had discussed CODE STATUS with the patient 9/22 and she wished to be DNR no CPR or intubation, patient's 2 daughters at the bedside In agreement.     RN Pressure Injury Documentation:    Malnutrition Type:  Nutrition Problem: Moderate Malnutrition Etiology: chronic illness (stage IV adenocarcinoma of lung)   Malnutrition Characteristics:  Signs/Symptoms: mild fat depletion, mild muscle depletion   Nutrition Interventions:  Interventions: Refer to RD note for recommendations, Ensure Enlive (each supplement provides 350kcal and 20 grams of protein)  Estimated body mass index is 27.49 kg/m as calculated from the following:   Height as of this encounter: 5\' 3"  (1.6 m).   Weight as of this encounter: 70.4 kg.  Code Status: DNR limited.  DVT Prophylaxis:   apixaban (ELIQUIS) tablet 5 mg   Level of Care: Level of care: Progressive Family Communication: none at bedside.   Disposition Plan:     Remains inpatient appropriate:  waiting for SNF.   Procedures:  None.   Consultants:   None.  Antimicrobials:   Anti-infectives (From admission, onward)    Start     Dose/Rate Route Frequency Ordered Stop   03/01/23 1015  azithromycin (ZITHROMAX) 500 mg in sodium chloride 0.9 % 250 mL IVPB  Status:  Discontinued        500 mg 250 mL/hr over 60 Minutes Intravenous Every 24 hours 03/01/23 1009 03/04/23 0856   03/01/23 1015  cefTRIAXone (ROCEPHIN) 1 g in sodium chloride 0.9 % 100 mL IVPB  Status:  Discontinued        1 g 200 mL/hr over 30 Minutes Intravenous Every 24 hours 03/01/23 1009 03/04/23 0856        Medications  Scheduled Meds:  apixaban  5 mg Oral BID   Chlorhexidine Gluconate Cloth  6 each Topical QHS   feeding supplement  237 mL Oral BID  BM   folic acid  1 mg Oral Daily   levothyroxine  100 mcg Oral Q0600   methylPREDNISolone  40 mg Oral Daily   mirtazapine  30 mg Oral QHS   senna  1 tablet Oral Daily   Continuous Infusions: PRN Meds:.acetaminophen **OR** acetaminophen, albuterol, ondansetron **OR** ondansetron (ZOFRAN) IV, mouth rinse, polyvinyl alcohol, sodium chloride flush    Subjective:   Destinymarie Steichen was seen and examined today.  Sleepy, no new complaints.   Objective:   Vitals:   03/12/23 1330 03/12/23 1959 03/13/23 0547 03/13/23 1155  BP: 124/73 124/82 (!) 144/72 123/65  Pulse: 94 89 81 80  Resp: 20 16  18   Temp: 98.4 F (36.9 C) 98.2 F (36.8 C) 98.3 F (36.8 C) 98.7 F (37.1 C)  TempSrc: Oral Oral Oral Oral  SpO2: 94% 92% 95% 97%  Weight:      Height:        Intake/Output Summary (  Last 24 hours) at 03/13/2023 1548 Last data filed at 03/13/2023 1300 Gross per 24 hour  Intake 1800 ml  Output --  Net 1800 ml   Filed Weights   03/01/23 0844 03/01/23 2000 03/11/23 1318  Weight: 67.1 kg 66.8 kg 70.4 kg     Exam General exam: Appears calm and comfortable  Respiratory system: Clear to auscultation. Respiratory effort normal. Cardiovascular system: S1 & S2 heard, RRR. No JVD, Gastrointestinal system: Abdomen is nondistended, soft and nontender.  Central nervous system: sleepy,  Extremities: no pedal edema.  Skin: No rashes, Psychiatry: Mood & affect appropriate.      Data Reviewed:  I have personally reviewed following labs and imaging studies   CBC Lab Results  Component Value Date   WBC 9.1 03/11/2023   RBC 4.08 03/11/2023   HGB 12.3 03/11/2023   HCT 39.8 03/11/2023   MCV 97.5 03/11/2023   MCH 30.1 03/11/2023   PLT 157 03/11/2023   MCHC 30.9 03/11/2023   RDW 15.4 03/11/2023   LYMPHSABS 0.7 03/11/2023   MONOABS 0.7 03/11/2023   EOSABS 0.1 03/11/2023   BASOSABS 0.0 03/11/2023     Last metabolic panel Lab Results  Component Value Date   NA 136 03/11/2023   K 3.8  03/11/2023   CL 102 03/11/2023   CO2 26 03/11/2023   BUN 20 03/11/2023   CREATININE 0.57 03/11/2023   GLUCOSE 122 (H) 03/11/2023   GFRNONAA >60 03/11/2023   GFRAA >90 11/25/2013   CALCIUM 8.5 (L) 03/11/2023   PROT 5.8 (L) 03/02/2023   ALBUMIN 3.3 (L) 03/02/2023   BILITOT 1.0 03/02/2023   ALKPHOS 59 03/02/2023   AST 17 03/02/2023   ALT 13 03/02/2023   ANIONGAP 8 03/11/2023    CBG (last 3)  No results for input(s): "GLUCAP" in the last 72 hours.    Coagulation Profile: No results for input(s): "INR", "PROTIME" in the last 168 hours.   Radiology Studies: No results found.     Kathlen Mody M.D. [facility] 03/13/2023, 3:48 PM  Available via Epic secure chat 7am-7pm After 7 pm, please refer to night coverage provider listed on amion.

## 2023-03-13 NOTE — Consult Note (Signed)
Consultation Note Date: 03/13/2023   Patient Name: Hayley Wu  DOB: November 09, 1950  MRN: [id number]  Age / Sex: 72 y.o., female  PCP: [name], PA-C Referring Physician: Kathlen Mody, MD  Reason for Consultation: Establishing goals of care  HPI/Patient Profile: 72 y.o. female admitted on 03/01/2023    Clinical Assessment and Goals of Care: 72 year old lady with acute hypoxic respiratory failure secondary to multifocal bilateral bronchopneumonia/pulmonary infiltrates.  She has serious illness of the right lung with stage IV adenocarcinoma, bony spinal metastases, pulmonary nodules and lymphadenopathy. Patient has been seen and evaluated by medical oncology, she sees Dr. Shirline Frees. Patient is awake alert oriented.  She is sitting up in bed.  She denies any acute complaints.  Brief life review performed.  She lives locally, daughter is her healthcare power of attorney agent, patient used to work in [employer].  She is retired now. Palliative medicine is specialized medical care for people living with serious illness. It focuses on providing relief from the symptoms and stress of a serious illness. The goal is to improve quality of life for both the patient and the family. Goals of care: Broad aims of medical therapy in relation to the patient's values and preferences. Our aim is to provide medical care aimed at enabling patients to achieve the goals that matter most to them, given the circumstances of their particular medical situation and their constraints.    HCPOA Daughter  SUMMARY OF RECOMMENDATIONS   Agree with DNR Advance care planning documents noted on the chart-healthcare power of attorney is daughter Belenda Cruise swing. Skilled nursing facility rehabilitation is the proposed disposition plan.  Recommend ongoing palliative support at [facility] rehab. Recommend palliative consultation with my colleague  Ms. Cousar, NP at [facility] for ongoing goals of care discussions depending on patient's overall condition and disease trajectory with regards to her malignancy. Thank you for the consult.  Code Status/Advance Care Planning: DNR   Symptom Management:     Palliative Prophylaxis:  Delirium Protocol    Psycho-social/Spiritual:  Desire for further Chaplaincy support:yes Additional Recommendations: Caregiving  Support/Resources  Prognosis:  Unable to determine  Discharge Planning: Skilled Nursing Facility for rehab with Palliative care service follow-up      Primary Diagnoses: Present on Admission:  Hypothyroidism  Hypertension  Hypokalemia  Adenocarcinoma of right lung, stage 4 (HCC)  Normocytic anemia  Moderate protein malnutrition (HCC)  Acute respiratory failure with hypoxia (HCC)  Constipation  Occlusion of left pulmonary artery (HCC)  Cardiomegaly   I have reviewed the medical record, interviewed the patient and family, and examined the patient. The following aspects are pertinent.  Past Medical History:  Diagnosis Date   Allergy    Contact lens/glasses fitting    Hypertension    Social History   Socioeconomic History   Marital status: Widowed    Spouse name: Not on file   Number of children: Not on file   Years of education: Not on file   Highest education level: Not on file  Occupational  History   Not on file  Tobacco Use   Smoking status: Every Day    Current packs/day: 1.00    Types: Cigarettes   Smokeless tobacco: Not on file  Substance and Sexual Activity   Alcohol use: No    Comment: rare   Drug use: No   Sexual activity: Never  Other Topics Concern   Not on file  Social History Narrative   Not on file   Social Determinants of Health   Financial Resource Strain: Not on file  Food Insecurity: No Food Insecurity (03/01/2023)   Hunger Vital Sign    Worried About Running Out of Food in the Last Year: Never true    Ran  Out of Food in the Last Year: Never true  Transportation Needs: No Transportation Needs (03/01/2023)   PRAPARE - Administrator, Civil Service (Medical): No    Lack of Transportation (Non-Medical): No  Physical Activity: Not on file  Stress: Not on file  Social Connections: Not on file   Family History  Problem Relation Age of Onset   Cancer Mother    Hypertension Father    Heart attack Father    Hypertension Sister    Hypertension Brother    Heart attack Brother    Cancer Daughter        thyroid   Diabetes Maternal Grandfather    Scheduled Meds:  apixaban  5 mg Oral BID   Chlorhexidine Gluconate Cloth  6 each Topical QHS   feeding supplement  237 mL Oral BID BM   folic acid  1 mg Oral Daily   levothyroxine  100 mcg Oral Q0600   methylPREDNISolone  40 mg Oral Daily   mirtazapine  30 mg Oral QHS   senna  1 tablet Oral Daily   Continuous Infusions: PRN Meds:.acetaminophen **OR** acetaminophen, albuterol, ondansetron **OR** ondansetron (ZOFRAN) IV, mouth rinse, polyvinyl alcohol, sodium chloride flush Medications Prior to Admission:  Prior to Admission medications   Medication Sig Start Date End Date Taking? Authorizing Provider  acetaminophen (TYLENOL) 500 MG tablet Take 500 mg by mouth every 6 (six) hours as needed for moderate pain.   Yes [provider]  amLODipine (NORVASC) 10 MG tablet Take 10 mg by mouth daily.   Yes [provider]  hydrochlorothiazide (HYDRODIURIL) 12.5 MG tablet Take 12.5 mg by mouth every morning. 12/14/22  Yes [provider]  levothyroxine (SYNTHROID) 100 MCG tablet Take 1 tablet (100 mcg total) by mouth daily at 6 (six) AM. 10/21/22 03/01/23 Yes Heilingoetter, Cassandra L, PA-C  lidocaine-prilocaine (EMLA) cream Apply 1 Application topically as needed. 12/30/22  Yes Heilingoetter, Cassandra L, PA-C  losartan (COZAAR) 100 MG tablet Take 100 mg by mouth daily.   Yes [provider]  mirtazapine (REMERON) 30  MG tablet Take 1 tablet (30 mg total) by mouth at bedtime. 02/24/23  Yes Si Gaul, MD  prochlorperazine (COMPAZINE) 10 MG tablet Take 1 tablet (10 mg total) by mouth every 6 (six) hours as needed. Patient taking differently: Take 10 mg by mouth every 6 (six) hours as needed for refractory nausea / vomiting. 10/21/22  Yes Heilingoetter, Cassandra L, PA-C  VITAMIN D PO Take 1 tablet by mouth daily.   Yes [provider]  APIXABAN Everlene Balls) VTE STARTER PACK (10MG  AND 5MG ) Take as directed on package: start with two-5mg  tablets twice daily for 7 days. On day 8, switch to one-5mg  tablet twice daily. Patient not taking: Reported on 03/01/2023 02/28/23   Heilingoetter,  Cassandra L, PA-C  folic acid (FOLVITE) 1 MG tablet Take 1 tablet by mouth once daily 03/10/23   Heilingoetter, Cassandra L, PA-C   Allergies  Allergen Reactions   Erythromycin Diarrhea   Amoxicillin Rash   Latex Rash   Prednisone Rash   Review of Systems Denies pain, denies shortness of breath complains of mild generalized weakness.  Physical Exam Awake alert No acute distress Regular work of breathing No edema No focal deficits Oriented Mood and affect within normal limits  Vital Signs: BP (!) 144/72 (BP Location: Right Arm)   Pulse 81   Temp 98.3 F (36.8 C) (Oral)   Resp 16   Ht 5\' 3"  (1.6 m)   Wt 70.4 kg   SpO2 95%   BMI 27.49 kg/m  Pain Scale: 0-10   Pain Score: 0-No pain   SpO2: SpO2: 95 % O2 Device:SpO2: 95 % O2 Flow Rate: .O2 Flow Rate (L/min): 2 L/min  IO: Intake/output summary:  Intake/Output Summary (Last 24 hours) at 03/13/2023 1049 Last data filed at 03/13/2023 0900 Gross per 24 hour  Intake 1440 ml  Output --  Net 1440 ml    LBM: Last BM Date : 03/11/23 Baseline Weight: Weight: 67.1 kg Most recent weight: Weight: 70.4 kg     Palliative Assessment/Data:   Palliative performance scale 60%  Time In: 9.30 Time Out: 10.30 Time Total: 60 Greater than 50%  of this time was  spent counseling and coordinating care related to the above assessment and plan.  Signed by: Rosalin Hawking, MD   Please contact Palliative Medicine Team phone at [phone] for questions and concerns.  For individual provider: See Loretha Stapler

## 2023-03-13 NOTE — TOC Progression Note (Addendum)
Transition of Care [facility]) - Progression Note    Patient Details  Name: Hayley Wu MRN: [id number] Date of Birth: June 29, 1950  [facility] of [facility]) CM/SW Contact  Howell Rucks, RN Phone Number: 03/13/2023, 10:00 AM  Clinical Narrative:   Per attending, peer to peer completed this morning, insurance medical director requesting update PT eval, PT added to teams chat requesting PT session today. Attending reports medical director requesting evaluation by Palliative Medicine Team, reports referral made yesterday. TOC will continue to follow  -1:10pm Call to [facility] at [facility] at [phone], informed peer to peer completed this am, Medical Director requested updated PT eval and Palliative Medicine consult, informed pt seen by PT today w/recommendation and Palliative Consult completed. Judeth Cornfield reports she was not aware of requested information and she will pass on the Medical Director and call NCM with update, Judeth Cornfield has NCM name and phone number, await call back.   -3:39pm Call received from [facility] with [employer], states request for short term rehab/SNF has been denied by Medical Director, reports official denial has been faxed to [facility] office with information on appeals process.  NCM met with pt at bedside, informed of denial for short term rehab/SNF and presented official denial letter with directions on appeals process, pt reports she would like to appeal the denial, agreeable to hospital initiating expedited appeal.  -4:19pm NCM call to [employer] at [phone], sw Pam, initiated expedited appeal, pt's name, member ID given, Pam states she will pass on information, provided NCM name and phone number, states NCM will receive a call back usually within 24 hrs, await call back.   -4:39pm LATE ENTRY: Call received from [facility] with [employer] requesting clinical support for appeal of denial for short term rehab, verbal  clinical support provided -patient is very deconditioned, pt was going to outpatient PT 2x/wk prior to admission and was independent in most ADL's, patient would benefit from short term rehab to improve strength, balance, gait and ambulation to return home to previous baseline. Per Annabelle Harman, verbal resolution will be provided within 72 hrs. .       Expected Discharge Plan and Services                                               Social Determinants of Health (SDOH) Interventions SDOH Screenings   Food Insecurity: No Food Insecurity (03/01/2023)  Housing: Low Risk  (03/01/2023)  Transportation Needs: No Transportation Needs (03/01/2023)  Utilities: Not At Risk (03/01/2023)  Depression (PHQ2-9): Low Risk  (10/02/2022)  Tobacco Use: High Risk (03/01/2023)    Readmission Risk Interventions    03/04/2023    6:23 PM  Readmission Risk Prevention Plan  Transportation Screening Complete  PCP or Specialist Appt within 3-5 Days Complete  HRI or Home Care Consult Complete  Social Work Consult for Recovery Care Planning/Counseling Complete  Palliative Care Screening Not Applicable  Medication Review Oceanographer) Complete

## 2023-03-14 DIAGNOSIS — D649 Anemia, unspecified: Secondary | ICD-10-CM | POA: Diagnosis not present

## 2023-03-14 DIAGNOSIS — J9601 Acute respiratory failure with hypoxia: Secondary | ICD-10-CM | POA: Diagnosis not present

## 2023-03-14 DIAGNOSIS — E039 Hypothyroidism, unspecified: Secondary | ICD-10-CM | POA: Diagnosis not present

## 2023-03-14 DIAGNOSIS — I10 Essential (primary) hypertension: Secondary | ICD-10-CM | POA: Diagnosis not present

## 2023-03-14 MED ORDER — METHYLPREDNISOLONE 4 MG PO TABS
30.0000 mg | ORAL_TABLET | Freq: Every day | ORAL | Status: AC
  Administered 2023-03-15 – 2023-03-17 (×3): 30 mg via ORAL
  Filled 2023-03-14 (×3): qty 4

## 2023-03-14 NOTE — Progress Notes (Signed)
[facility]                                                                               Hayley Wu, is a 72 y.o. 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 Patient also having cough occasionally productive, mild wheezing.  She has been prescribed mirtazapine recently for low appetite. In the ED: Hypoxic 50% on room air placed on nonrebreather.  Labs showed hypokalemia stable renal function albumin 2.8 Pro-Cal less than 0.1 lactic acid 0.9 BNP 152 CBC with hemoglobin 10.8 influenza COVID-19 screen negative, UA with WBC more than 50 large leukocytes. chest x-ray>> bilateral opacities right more than left concerning for pneumonia. CTA>> occluded left lower lobe pulmonary artery branch since 7/14, stable collapse of T7 with paraspinal soft tissue density and acute kyphosis, interval change of CHF, cardiomegaly interstitial pulmonary edema minimal right pleural effusion known primary lung carcinoma 2.9 cm right lower lobe stable and large lymph nodes, stable left humeral head sclerotic metastasis or infarct, COPD. Patient was wheezing and having rales on exam.  Placed on empiric antibiotic and admitted PCCM was consulted due to worsening hypoxia, echo obtained shows EF 55 to 60% G1 DD normal RV systolic function mitral valve normal aortic valve tricuspid. 9/22 Started on a steroid trial and will continue with empiric antibiotics.  Echocardiogram normal LV RV function.   Patient seen and examined today. No new complaints.    Assessment & Plan    Assessment and Plan:     Acute respiratory distress with acute hypoxic respiratory failure secondary to multifocal bilateral bronchopneumonia/pulmonary infiltrates Adenocarcinoma of right lung stage IV: Imaging shows bony spinal mets pulmonary nodules and lymphadenopathy along with bilateral multifocal opacities Differentials:pneumonia, opportunistic infections, pneumonitis due to either radiation or immunotherapy.BNP was up however echo with normal LV RV function got few doses of IV Lasix initially.  PCCM managing appreciate input doing high-dose steroid trial with 125 mg x 3 days.  Patient on Solu-Medrol 60 mg IV daily since 03/05/2023 per PCCM recommendation, they also recommend that patient will eventually need prednisolone for 3 weeks.  Started tapering steroids.  She was assessed by PT OT who recommended SNF.  TOC has been consulted.  Waiting for insurance authorization. Continue supplemental oxygen bronchodilators, anticoagulant Eliquis.  Since procalcitonin negative-antibiotic  stopped 9/24. Dr Arbutus Ped aware of admission and agree w/ above management> per PCCM depending upon workup may need to consider change from her current immunotherapy.   Cardiomegaly: ECHO done and reviewed.    Anemia Initial level of 11.4, probably dilutional.  Hemoglobin has been stable around 12.    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 Continue with eliquis.     Hypertension: Well controlled.    Hypothyroidism: Continue levothyroxine   Hypokalemia: Resolved, repeat K WNL.    Constipation: Continue stool softener, last BM 9/19   Protein malnutrition moderate: Augment diet, continue Remeron at bedtime  Nutrition Status: Goals of care: Previous hospitalist had discussed CODE STATUS with the patient 9/22 and she wished to be DNR no CPR  or intubation, patient's 2 daughters at the bedside In agreement.     RN Pressure Injury Documentation:    Malnutrition Type:  Nutrition Problem: Moderate Malnutrition Etiology: chronic illness (stage IV adenocarcinoma of lung)   Malnutrition Characteristics:  Signs/Symptoms: mild fat depletion, mild muscle depletion   Nutrition Interventions:  Interventions: Refer to RD note for recommendations, Ensure Enlive (each supplement provides 350kcal and 20 grams of protein)  Estimated body mass index is 27.49 kg/m as calculated from the following:   Height as of this encounter: 5\' 3"  (1.6 m).   Weight as of this encounter: 70.4 kg.  Code Status: DNR limited.  DVT Prophylaxis:   apixaban (ELIQUIS) tablet 5 mg   Level of Care: Level of care: Progressive Family Communication: none at bedside.   Disposition Plan:     Remains inpatient appropriate:  waiting for SNF. She was denied and , she is appealing the decision.   Procedures:  None.   Consultants:   None.  Antimicrobials:   Anti-infectives (From admission, onward)    Start     Dose/Rate Route Frequency Ordered Stop   03/01/23 1015  azithromycin (ZITHROMAX) 500 mg in sodium chloride 0.9 % 250 mL IVPB  Status:  Discontinued        500 mg 250 mL/hr over 60 Minutes Intravenous Every 24 hours 03/01/23 1009 03/04/23 0856   03/01/23 1015  cefTRIAXone (ROCEPHIN) 1 g in sodium chloride 0.9 % 100 mL IVPB  Status:  Discontinued        1 g 200 mL/hr over 30 Minutes Intravenous Every 24 hours 03/01/23 1009 03/04/23 0856        Medications  Scheduled Meds:  apixaban  5 mg Oral BID   Chlorhexidine Gluconate Cloth  6 each Topical QHS   feeding supplement  237 mL Oral BID BM   folic acid  1 mg Oral Daily   levothyroxine  100 mcg Oral Q0600   mirtazapine  30 mg Oral QHS   senna  1 tablet Oral Daily   Continuous Infusions: PRN Meds:.acetaminophen **OR** acetaminophen, albuterol, ondansetron **OR** ondansetron (ZOFRAN) IV,  mouth rinse, polyvinyl alcohol, sodium chloride flush    Subjective:   Hayley Wu was seen and examined today.  No new complaints.   Objective:   Vitals:   03/13/23 1155 03/13/23 2206 03/14/23 0700 03/14/23 1240  BP: 123/65 125/82 (!) 154/86 129/85  Pulse: 80 78 81 88  Resp: 18 16 16 18   Temp: 98.7 F (37.1 C) (!) 97.5 F (36.4 C) 98.1 F (36.7 C) 99.2 F (37.3 C)  TempSrc: Oral Oral Oral Oral  SpO2: 97% 98% 95% 96%  Weight:      Height:        Intake/Output Summary (Last 24 hours) at 03/14/2023 1553 Last data filed at 03/14/2023 0853 Gross per 24 hour  Intake 250 ml  Output --  Net 250 ml   Filed Weights   03/01/23 0844 03/01/23 2000 03/11/23 1318  Weight: 67.1 kg 66.8 kg 70.4 kg     Exam General exam: Appears calm and  comfortable  Respiratory system: Clear to auscultation. Respiratory effort normal. Cardiovascular system: S1 & S2 heard,  Gastrointestinal system: Abdomen is nondistended, soft and nontender.  Central nervous system: Alert and oriented. Extremities: Symmetric 5 x 5 power. Skin: No rashes,  Psychiatry: Mood & affect appropriate.       Data Reviewed:  I have personally reviewed following labs and imaging studies   CBC Lab Results  Component Value Date   WBC 9.1 03/11/2023   RBC 4.08 03/11/2023   HGB 12.3 03/11/2023   HCT 39.8 03/11/2023   MCV 97.5 03/11/2023   MCH 30.1 03/11/2023   PLT 157 03/11/2023   MCHC 30.9 03/11/2023   RDW 15.4 03/11/2023   LYMPHSABS 0.7 03/11/2023   MONOABS 0.7 03/11/2023   EOSABS 0.1 03/11/2023   BASOSABS 0.0 03/11/2023     Last metabolic panel Lab Results  Component Value Date   NA 136 03/11/2023   K 3.8 03/11/2023   CL 102 03/11/2023   CO2 26 03/11/2023   BUN 20 03/11/2023   CREATININE 0.57 03/11/2023   GLUCOSE 122 (H) 03/11/2023   GFRNONAA >60 03/11/2023   GFRAA >90 11/25/2013   CALCIUM 8.5 (L) 03/11/2023   PROT 5.8 (L) 03/02/2023   ALBUMIN 3.3 (L) 03/02/2023   BILITOT 1.0 03/02/2023    ALKPHOS 59 03/02/2023   AST 17 03/02/2023   ALT 13 03/02/2023   ANIONGAP 8 03/11/2023    CBG (last 3)  No results for input(s): "GLUCAP" in the last 72 hours.    Coagulation Profile: No results for input(s): "INR", "PROTIME" in the last 168 hours.   Radiology Studies: No results found.     Kathlen Mody M.D. [facility] 03/14/2023, 3:53 PM  Available via Epic secure chat 7am-7pm After 7 pm, please refer to night coverage provider listed on amion.

## 2023-03-14 NOTE — Progress Notes (Signed)
Daily Progress Note   Patient Name: Hayley Wu       Date: 03/14/2023 DOB: [birth year]  Age: 72 y.o. MRN#: [id number] Attending Physician: Kathlen Mody, MD Primary Care Physician: [name] Admit Date: 03/01/2023  Reason for Consultation/Follow-up: Establishing goals of care  Subjective:  Awake alert resting in bed.   Length of Stay: 13  Current Medications: Scheduled Meds:   apixaban  5 mg Oral BID   Chlorhexidine Gluconate Cloth  6 each Topical QHS   feeding supplement  237 mL Oral BID BM   folic acid  1 mg Oral Daily   levothyroxine  100 mcg Oral Q0600   mirtazapine  30 mg Oral QHS   senna  1 tablet Oral Daily    Continuous Infusions:   PRN Meds: acetaminophen **OR** acetaminophen, albuterol, ondansetron **OR** ondansetron (ZOFRAN) IV, mouth rinse, polyvinyl alcohol, sodium chloride flush  Physical Exam         Awake alert No distress S 1 S 2  Abdomen not distended No edema  Vital Signs: BP 129/85 (BP Location: Right Arm)   Pulse 88   Temp 99.2 F (37.3 C) (Oral)   Resp 18   Ht 5\' 3"  (1.6 m)   Wt 70.4 kg   SpO2 96%   BMI 27.49 kg/m  SpO2: SpO2: 96 % O2 Device: O2 Device: Nasal Cannula O2 Flow Rate: O2 Flow Rate (L/min): 2 L/min  Intake/output summary:  Intake/Output Summary (Last 24 hours) at 03/14/2023 1250 Last data filed at 03/14/2023 0853 Gross per 24 hour  Intake 610 ml  Output --  Net 610 ml   LBM: Last BM Date : 03/14/23 Baseline Weight: Weight: 67.1 kg Most recent weight: Weight: 70.4 kg       Palliative Assessment/Data:      Patient Active Problem List   Diagnosis Date Noted   Normocytic anemia 03/01/2023   Moderate protein malnutrition (HCC) 03/01/2023   Acute respiratory failure with hypoxia (HCC) 03/01/2023    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   Cardiomegaly 03/01/2023   Pleural effusion due to CHF (congestive heart failure) (HCC) 03/01/2023   Port-A-Cath in place 12/30/2022   Encounter for antineoplastic immunotherapy 11/18/2022   Encounter for antineoplastic chemotherapy 11/18/2022   Constipation 11/05/2022   Goals of care, counseling/discussion 10/21/2022  Cancer, metastatic to bone (HCC) 10/04/2022   Adenocarcinoma of right lung, stage 4 (HCC) 10/01/2022   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 09/20/2022   Hypokalemia 09/20/2022   Lung mass 09/20/2022   Hypothyroidism 09/20/2022   Cholelithiasis 09/20/2022   Lumbar spondylosis 08/26/2022   Thoracic back pain 04/04/2022   Low back pain 04/04/2022   Hypertension 09/23/2011    Palliative Care Assessment & Plan   Patient Profile:    Assessment:  Acute respiratory distress with acute hypoxic respiratory failure secondary to multifocal bilateral bronchopneumonia/pulmonary infiltrates Adenocarcinoma of right lung stage IV: Imaging shows bony spinal mets pulmonary nodules and lymphadenopathy along with bilateral multifocal opacities  Recommendations/Plan:   Goals of care discussions undertaken with patient - she is on 2 L O2 [place], she feels like she is participating well with PT and wants SNF rehab. Her goals are for continuing PT efforts. Her goals are not for comfort-focused care only, she is not yet for hospice mindset. " I am not ready to kick the bucket yet."   Code Status:    Code Status Orders  (From admission, onward)           Start     Ordered   03/02/23 1016  Do not attempt resuscitation (DNR)- Limited -Do Not Intubate (DNI)  (Code Status)  Continuous       Question Answer Comment  If pulseless and not breathing No CPR or chest compressions.   In Pre-Arrest  Conditions (Patient Is Breathing and Has A Pulse) Do not intubate. Provide all appropriate non-invasive medical interventions. Avoid ICU transfer unless indicated or required.   Consent: Discussion documented in EHR or advanced directives reviewed      03/02/23 1016           Code Status History     Date Active Date Inactive Code Status Order ID Comments User Context   03/01/2023 1405 03/02/2023 1016 Full Code [id number]  Bobette Mo, MD ED   12/18/2022 1444 12/19/2022 0509 Full Code [id number]  Berdine Dance, MD [facility]   09/20/2022 2117 09/27/2022 2358 Full Code [id number]  Orland Mustard, MD ED   11/26/2013 1051 11/26/2013 1538 Full Code [id number]  Allena Katz, PA-C Inpatient       Prognosis:  Unable to determine  Discharge Planning: Skilled Nursing Facility for rehab with Palliative care service follow-up Versus home with home health.   Care plan was discussed with  IDT  Thank you for allowing the Palliative Medicine Team to assist in the care of this patient. MOD MDM     Greater than 50%  of this time was spent counseling and coordinating care related to the above assessment and plan.  Rosalin Hawking, MD  Please contact Palliative Medicine Team phone at [phone] for questions and concerns.

## 2023-03-14 NOTE — Progress Notes (Signed)
Assumed care, Agree with previous RN assessment

## 2023-03-14 NOTE — Plan of Care (Signed)
  Problem: Education: Goal: Knowledge of General Education information will improve Description: Including pain rating scale, medication(s)/side effects and non-pharmacologic comfort measures Outcome: Completed/Met   Problem: Health Behavior/Discharge Planning: Goal: Ability to manage health-related needs will improve Outcome: Progressing   Problem: Clinical Measurements: Goal: Ability to maintain clinical measurements within normal limits will improve Outcome: Adequate for Discharge Goal: Will remain free from infection Outcome: Adequate for Discharge Goal: Diagnostic test results will improve Outcome: Adequate for Discharge Goal: Respiratory complications will improve Outcome: Adequate for Discharge Goal: Cardiovascular complication will be avoided Outcome: Adequate for Discharge   Problem: Activity: Goal: Risk for activity intolerance will decrease Outcome: Progressing   Problem: Nutrition: Goal: Adequate nutrition will be maintained Outcome: Adequate for Discharge   Problem: Coping: Goal: Level of anxiety will decrease Outcome: Progressing   Problem: Elimination: Goal: Will not experience complications related to bowel motility Outcome: Adequate for Discharge Goal: Will not experience complications related to urinary retention Outcome: Adequate for Discharge   Problem: Pain Managment: Goal: General experience of comfort will improve Outcome: Adequate for Discharge   Problem: Safety: Goal: Ability to remain free from injury will improve Outcome: Adequate for Discharge   Problem: Skin Integrity: Goal: Risk for impaired skin integrity will decrease Outcome: Adequate for Discharge

## 2023-03-15 DIAGNOSIS — I10 Essential (primary) hypertension: Secondary | ICD-10-CM | POA: Diagnosis not present

## 2023-03-15 DIAGNOSIS — D649 Anemia, unspecified: Secondary | ICD-10-CM | POA: Diagnosis not present

## 2023-03-15 DIAGNOSIS — E039 Hypothyroidism, unspecified: Secondary | ICD-10-CM | POA: Diagnosis not present

## 2023-03-15 DIAGNOSIS — J9601 Acute respiratory failure with hypoxia: Secondary | ICD-10-CM | POA: Diagnosis not present

## 2023-03-15 NOTE — Progress Notes (Signed)
Daily Progress Note   Patient Name: Hayley Wu       Date: 03/15/2023 DOB: 23-Jan-1951  Age: 72 y.o. MRN#: [id number] Attending Physician: Kathlen Mody, MD Primary Care Physician: [name] Admit Date: 03/01/2023  Reason for Consultation/Follow-up: Establishing goals of care  Subjective:  resting in bed.  No new complaints.   Length of Stay: 14  Current Medications: Scheduled Meds:   apixaban  5 mg Oral BID   Chlorhexidine Gluconate Cloth  6 each Topical QHS   feeding supplement  237 mL Oral BID BM   folic acid  1 mg Oral Daily   levothyroxine  100 mcg Oral Q0600   methylPREDNISolone  30 mg Oral Daily   mirtazapine  30 mg Oral QHS   senna  1 tablet Oral Daily    Continuous Infusions:   PRN Meds: acetaminophen **OR** acetaminophen, albuterol, ondansetron **OR** ondansetron (ZOFRAN) IV, mouth rinse, polyvinyl alcohol, sodium chloride flush  Physical Exam         Awake alert No distress S 1 S 2  Abdomen not distended No edema  Vital Signs: BP 136/80 (BP Location: Right Arm)   Pulse 73   Temp 97.8 F (36.6 C) (Oral)   Resp 16   Ht 5\' 3"  (1.6 m)   Wt 70.4 kg   SpO2 97%   BMI 27.49 kg/m  SpO2: SpO2: 97 % O2 Device: O2 Device: Nasal Cannula O2 Flow Rate: O2 Flow Rate (L/min): 2 L/min  Intake/output summary:  Intake/Output Summary (Last 24 hours) at 03/15/2023 0937 Last data filed at 03/15/2023 0747 Gross per 24 hour  Intake 720 ml  Output --  Net 720 ml   LBM: Last BM Date : 03/15/23 Baseline Weight: Weight: 67.1 kg Most recent weight: Weight: 70.4 kg       Palliative Assessment/Data:      Patient Active Problem List   Diagnosis Date Noted   Normocytic anemia 03/01/2023   Moderate protein malnutrition (HCC) 03/01/2023   Acute  respiratory failure with hypoxia (HCC) 03/01/2023   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   Cardiomegaly 03/01/2023   Pleural effusion due to CHF (congestive heart failure) (HCC) 03/01/2023   Port-A-Cath in place 12/30/2022   Encounter for antineoplastic immunotherapy 11/18/2022   Encounter for antineoplastic chemotherapy 11/18/2022   Constipation  11/05/2022   Goals of care, counseling/discussion 10/21/2022   Cancer, metastatic to bone (HCC) 10/04/2022   Adenocarcinoma of right lung, stage 4 (HCC) 10/01/2022   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 09/20/2022   Hypokalemia 09/20/2022   Lung mass 09/20/2022   Hypothyroidism 09/20/2022   Cholelithiasis 09/20/2022   Lumbar spondylosis 08/26/2022   Thoracic back pain 04/04/2022   Low back pain 04/04/2022   Hypertension 09/23/2011    Palliative Care Assessment & Plan   Patient Profile:    Assessment:  Acute respiratory distress with acute hypoxic respiratory failure secondary to multifocal bilateral bronchopneumonia/pulmonary infiltrates Adenocarcinoma of right lung stage IV: Imaging shows bony spinal mets pulmonary nodules and lymphadenopathy along with bilateral multifocal opacities  Recommendations/Plan:   Goals of care discussions undertaken with patient - she is on 2 L O2 [place], she feels like she is participating well with PT and wants SNF rehab. Her goals are for continuing PT efforts. Her goals are not for comfort-focused care only, she is not yet for hospice mindset. " I am not ready to kick the bucket yet."  Continue efforts at PT. No new inpatient PMT specific recommendations at this time, except - if the plan is for home with home health, recommend outpatient palliative as an extra layer of support.   Code Status:    Code Status Orders  (From admission, onward)            Start     Ordered   03/02/23 1016  Do not attempt resuscitation (DNR)- Limited -Do Not Intubate (DNI)  (Code Status)  Continuous       Question Answer Comment  If pulseless and not breathing No CPR or chest compressions.   In Pre-Arrest Conditions (Patient Is Breathing and Has A Pulse) Do not intubate. Provide all appropriate non-invasive medical interventions. Avoid ICU transfer unless indicated or required.   Consent: Discussion documented in EHR or advanced directives reviewed      03/02/23 1016           Code Status History     Date Active Date Inactive Code Status Order ID Comments User Context   03/01/2023 1405 03/02/2023 1016 Full Code [id number]  Bobette Mo, MD ED   12/18/2022 1444 12/19/2022 0509 Full Code [id number]  Berdine Dance, MD [facility]   09/20/2022 2117 09/27/2022 2358 Full Code [id number]  Orland Mustard, MD ED   11/26/2013 1051 11/26/2013 1538 Full Code [id number]  Allena Katz, PA-C Inpatient       Prognosis:  Unable to determine  Discharge Planning: Skilled Nursing Facility for rehab with Palliative care service follow-up Versus home with home health.   Care plan was discussed with  IDT  Thank you for allowing the Palliative Medicine Team to assist in the care of this patient. low MDM     Greater than 50%  of this time was spent counseling and coordinating care related to the above assessment and plan.  Rosalin Hawking, MD  Please contact Palliative Medicine Team phone at [phone] for questions and concerns.

## 2023-03-15 NOTE — Plan of Care (Signed)
  Problem: Clinical Measurements: Goal: Diagnostic test results will improve Outcome: Progressing Goal: Respiratory complications will improve Outcome: Progressing   Problem: Activity: Goal: Risk for activity intolerance will decrease Outcome: Progressing   Problem: Nutrition: Goal: Adequate nutrition will be maintained Outcome: Progressing   Problem: Coping: Goal: Level of anxiety will decrease Outcome: Progressing

## 2023-03-15 NOTE — Progress Notes (Signed)
[facility]                                                                               Hayley Wu, is a 72 y.o. 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 Patient also having cough occasionally productive, mild wheezing.  She has been prescribed mirtazapine recently for low appetite. In the ED: Hypoxic 50% on room air placed on nonrebreather.  Labs showed hypokalemia stable renal function albumin 2.8 Pro-Cal less than 0.1 lactic acid 0.9 BNP 152 CBC with hemoglobin 10.8 influenza COVID-19 screen negative, UA with WBC more than 50 large leukocytes. chest x-ray>> bilateral opacities right more than left concerning for pneumonia. CTA>> occluded left lower lobe pulmonary artery branch since 7/14, stable collapse of T7 with paraspinal soft tissue density and acute kyphosis, interval change of CHF, cardiomegaly interstitial pulmonary edema minimal right pleural effusion known primary lung carcinoma 2.9 cm right lower lobe stable and large lymph nodes, stable left humeral head sclerotic metastasis or infarct, COPD. Patient was wheezing and having rales on exam.  Placed on empiric antibiotic and admitted PCCM was consulted due to worsening hypoxia, echo obtained shows EF 55 to 60% G1 DD normal RV systolic function mitral valve normal aortic valve tricuspid. 9/22 Started on a steroid trial and will continue with empiric antibiotics.  Echocardiogram normal LV RV function.   Patient seen and examined today. No new complaints.   Awaiting for the appeal process.   Assessment & Plan    Assessment and Plan:     Acute respiratory distress with acute hypoxic respiratory failure secondary to multifocal bilateral bronchopneumonia/pulmonary infiltrates Adenocarcinoma of right lung stage IV: Imaging shows bony spinal mets pulmonary nodules and lymphadenopathy along with bilateral multifocal opacities Differentials:pneumonia, opportunistic infections, pneumonitis due to either radiation or immunotherapy.BNP was up however echo with normal LV RV function got few doses of IV Lasix initially.  PCCM managing appreciate input doing high-dose steroid trial with 125 mg x 3 days.  Patient on Solu-Medrol 60 mg IV daily since 03/05/2023 per PCCM recommendation, they also recommend that patient will eventually need prednisolone for 3 weeks.  Started tapering steroids.  She was assessed by PT OT who recommended SNF.  TOC has been consulted.  Waiting for insurance authorization. Continue supplemental oxygen bronchodilators, anticoagulant Eliquis.  Since procalcitonin negative-antibiotic  stopped 9/24. Dr Arbutus Ped aware of admission and agree w/ above management> per PCCM depending upon workup may need to consider change from her current immunotherapy. No changes in meds today.    Cardiomegaly: ECHO done and reviewed.    Anemia Initial level of 11.4, probably dilutional.  Hemoglobin has been stable around 12.    Acute bilateral LE DVT left common femoral and left  proximal profunda vein Newly identified subacute left pulmonary embolism: Ct Occluded LLL pulm artery was since July. Continue with eliquis.     Hypertension: BP parameters are optimal.    Hypothyroidism: Continue levothyroxine   Hypokalemia: Resolved, repeat K WNL.    Constipation: Continue stool softener, last BM 9/19   Protein malnutrition moderate: Augment diet, continue Remeron at bedtime  Nutrition Status: Goals of care: Previous hospitalist had  discussed CODE STATUS with the patient 9/22 and she wished to be DNR no CPR or intubation, patient's 2 daughters at the bedside In agreement.     RN Pressure Injury Documentation:    Malnutrition Type:  Nutrition Problem: Moderate Malnutrition Etiology: chronic illness (stage IV adenocarcinoma of lung)   Malnutrition Characteristics:  Signs/Symptoms: mild fat depletion, mild muscle depletion   Nutrition Interventions:  Interventions: Refer to RD note for recommendations, Ensure Enlive (each supplement provides 350kcal and 20 grams of protein)  Estimated body mass index is 27.49 kg/m as calculated from the following:   Height as of this encounter: 5\' 3"  (1.6 m).   Weight as of this encounter: 70.4 kg.  Code Status: DNR limited.  DVT Prophylaxis:   apixaban (ELIQUIS) tablet 5 mg   Level of Care: Level of care: Progressive Family Communication: none at bedside.   Disposition Plan:     Remains inpatient appropriate:  waiting for SNF. She was denied and , she is appealing the decision.   Procedures:  None.   Consultants:   None.  Antimicrobials:   Anti-infectives (From admission, onward)    Start     Dose/Rate Route Frequency Ordered Stop   03/01/23 1015  azithromycin (ZITHROMAX) 500 mg in sodium chloride 0.9 % 250 mL IVPB  Status:  Discontinued        500 mg 250 mL/hr over 60 Minutes Intravenous Every 24 hours 03/01/23 1009 03/04/23 0856   03/01/23 1015  cefTRIAXone (ROCEPHIN) 1 g in sodium chloride 0.9 % 100 mL IVPB  Status:  Discontinued        1 g 200 mL/hr over 30 Minutes Intravenous Every 24 hours 03/01/23 1009 03/04/23 0856        Medications  Scheduled Meds:  apixaban  5 mg Oral BID   Chlorhexidine Gluconate Cloth  6 each Topical QHS   feeding supplement  237 mL Oral BID BM   folic acid  1 mg Oral Daily   levothyroxine  100 mcg Oral Q0600   methylPREDNISolone  30 mg Oral Daily   mirtazapine  30 mg Oral QHS   senna  1 tablet Oral Daily    Continuous Infusions: PRN Meds:.acetaminophen **OR** acetaminophen, albuterol, ondansetron **OR** ondansetron (ZOFRAN) IV, mouth rinse, polyvinyl alcohol, sodium chloride flush    Subjective:   Hayley Wu was seen and examined today. No new complaints.   Objective:   Vitals:   03/14/23 1240 03/14/23 1935 03/15/23 0553 03/15/23 1342  BP: 129/85 131/74 136/80 117/70  Pulse: 88 93 73 89  Resp: 18 19 16    Temp: 99.2 F (37.3 C) 98.5 F (36.9 C) 97.8 F (36.6 C) 98.1 F (36.7 C)  TempSrc: Oral Oral Oral Oral  SpO2: 96% 95% 97% 96%  Weight:      Height:        Intake/Output Summary (Last 24 hours) at 03/15/2023 1510 Last data filed at 03/15/2023 0747 Gross per 24 hour  Intake 720 ml  Output --  Net 720 ml   Filed Weights   03/01/23 0844 03/01/23 2000 03/11/23  1318  Weight: 67.1 kg 66.8 kg 70.4 kg     Exam General exam: Appears calm and comfortable  Respiratory system: Clear to auscultation. Respiratory effort normal. Cardiovascular system: S1 & S2 heard, RRR.  Gastrointestinal system: Abdomen is nondistended, soft and nontender.  Central nervous system: Alert and oriented. No focal neurological deficits. Extremities: Symmetric 5 x 5 power. Skin: No rashes, lesions or ulcers Psychiatry:\Mood & affect appropriate.        Data Reviewed:  I have personally reviewed following labs and imaging studies   CBC Lab Results  Component Value Date   WBC 9.1 03/11/2023   RBC 4.08 03/11/2023   HGB 12.3 03/11/2023   HCT 39.8 03/11/2023   MCV 97.5 03/11/2023   MCH 30.1 03/11/2023   PLT 157 03/11/2023   MCHC 30.9 03/11/2023   RDW 15.4 03/11/2023   LYMPHSABS 0.7 03/11/2023   MONOABS 0.7 03/11/2023   EOSABS 0.1 03/11/2023   BASOSABS 0.0 03/11/2023     Last metabolic panel Lab Results  Component Value Date   NA 136 03/11/2023   K 3.8 03/11/2023   CL 102 03/11/2023   CO2 26 03/11/2023   BUN 20 03/11/2023   CREATININE 0.57 03/11/2023   GLUCOSE 122 (H)  03/11/2023   GFRNONAA >60 03/11/2023   GFRAA >90 11/25/2013   CALCIUM 8.5 (L) 03/11/2023   PROT 5.8 (L) 03/02/2023   ALBUMIN 3.3 (L) 03/02/2023   BILITOT 1.0 03/02/2023   ALKPHOS 59 03/02/2023   AST 17 03/02/2023   ALT 13 03/02/2023   ANIONGAP 8 03/11/2023    CBG (last 3)  No results for input(s): "GLUCAP" in the last 72 hours.    Coagulation Profile: No results for input(s): "INR", "PROTIME" in the last 168 hours.   Radiology Studies: No results found.     Kathlen Mody M.D. [facility] 03/15/2023, 3:10 PM  Available via Epic secure chat 7am-7pm After 7 pm, please refer to night coverage provider listed on amion.

## 2023-03-16 DIAGNOSIS — E039 Hypothyroidism, unspecified: Secondary | ICD-10-CM | POA: Diagnosis not present

## 2023-03-16 DIAGNOSIS — I10 Essential (primary) hypertension: Secondary | ICD-10-CM | POA: Diagnosis not present

## 2023-03-16 DIAGNOSIS — D649 Anemia, unspecified: Secondary | ICD-10-CM | POA: Diagnosis not present

## 2023-03-16 DIAGNOSIS — J9601 Acute respiratory failure with hypoxia: Secondary | ICD-10-CM | POA: Diagnosis not present

## 2023-03-16 NOTE — Progress Notes (Signed)
[facility]                                                                               Hayley Wu, is a 72 y.o. 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 Patient also having cough occasionally productive, mild wheezing.  She has been prescribed mirtazapine recently for low appetite. In the ED: Hypoxic 50% on room air placed on nonrebreather.  Labs showed hypokalemia stable renal function albumin 2.8 Pro-Cal less than 0.1 lactic acid 0.9 BNP 152 CBC with hemoglobin 10.8 influenza COVID-19 screen negative, UA with WBC more than 50 large leukocytes. chest x-ray>> bilateral opacities right more than left concerning for pneumonia. CTA>> occluded left lower lobe pulmonary artery branch since 7/14, stable collapse of T7 with paraspinal soft tissue density and acute kyphosis, interval change of CHF, cardiomegaly interstitial pulmonary edema minimal right pleural effusion known primary lung carcinoma 2.9 cm right lower lobe stable and large lymph nodes, stable left humeral head sclerotic metastasis or infarct, COPD. Patient was wheezing and having rales on exam.  Placed on empiric antibiotic and admitted PCCM was consulted due to worsening hypoxia, echo obtained shows EF 55 to 60% G1 DD normal RV systolic function mitral valve normal aortic valve tricuspid. 9/22 Started on a steroid trial and will continue with empiric antibiotics.  Echocardiogram normal LV RV function.   Patient seen and examined today.  No new  complaints.  Awaiting for the appeal process.   Assessment & Plan    Assessment and Plan:     Acute respiratory distress with acute hypoxic respiratory failure secondary to multifocal bilateral bronchopneumonia/pulmonary infiltrates Adenocarcinoma of right lung stage IV: Imaging shows bony spinal mets pulmonary nodules and lymphadenopathy along with bilateral multifocal opacities Differentials:pneumonia, opportunistic infections, pneumonitis due to either radiation or immunotherapy.BNP was up however echo with normal LV RV function got few doses of IV Lasix initially.  PCCM managing appreciate input doing high-dose steroid trial with 125 mg x 3 days.  Patient on Solu-Medrol 60 mg IV daily since 03/05/2023 per PCCM recommendation, they also recommend that patient will eventually need prednisolone for 3 weeks.  Started tapering steroids.  She was assessed by PT OT who recommended SNF.  TOC has been consulted.  Waiting for insurance authorization. Continue supplemental oxygen bronchodilators, anticoagulant Eliquis.  Since procalcitonin negative-antibiotic  stopped 9/24. Dr Arbutus Ped aware of admission and agree w/ above management> per PCCM depending upon workup may need to consider change from her current immunotherapy. No changes in meds today.    Cardiomegaly: ECHO done and reviewed.    Anemia Initial level of 11.4, probably dilutional.  Hemoglobin has been stable around 12.    Acute bilateral LE DVT left common femoral and  left proximal profunda vein Newly identified subacute left pulmonary embolism: Ct Occluded LLL pulm artery was since July. Continue with eliquis.     Hypertension: BP parameters are well controlled.    Hypothyroidism: Continue levothyroxine   Hypokalemia: Resolved, repeat K WNL.    Constipation: Continue stool softener, last BM 9/19   Protein malnutrition moderate: Augment diet, continue Remeron at bedtime  Nutrition Status: Goals of care: Previous  hospitalist had discussed CODE STATUS with the patient 9/22 and she wished to be DNR no CPR or intubation, patient's 2 daughters at the bedside In agreement.     RN Pressure Injury Documentation:    Malnutrition Type:  Nutrition Problem: Moderate Malnutrition Etiology: chronic illness (stage IV adenocarcinoma of lung)   Malnutrition Characteristics:  Signs/Symptoms: mild fat depletion, mild muscle depletion   Nutrition Interventions:  Interventions: Refer to RD note for recommendations, Ensure Enlive (each supplement provides 350kcal and 20 grams of protein)  Estimated body mass index is 27.49 kg/m as calculated from the following:   Height as of this encounter: 5\' 3"  (1.6 m).   Weight as of this encounter: 70.4 kg.  Code Status: DNR limited.  DVT Prophylaxis:   apixaban (ELIQUIS) tablet 5 mg   Level of Care: Level of care: Progressive Family Communication: none at bedside.   Disposition Plan:     Remains inpatient appropriate:  waiting for SNF. She was denied and , she is appealing the decision.   Procedures:  None.   Consultants:   None.  Antimicrobials:   Anti-infectives (From admission, onward)    Start     Dose/Rate Route Frequency Ordered Stop   03/01/23 1015  azithromycin (ZITHROMAX) 500 mg in sodium chloride 0.9 % 250 mL IVPB  Status:  Discontinued        500 mg 250 mL/hr over 60 Minutes Intravenous Every 24 hours 03/01/23 1009 03/04/23 0856   03/01/23 1015  cefTRIAXone (ROCEPHIN) 1 g in sodium chloride 0.9 % 100 mL IVPB  Status:  Discontinued        1 g 200 mL/hr over 30 Minutes Intravenous Every 24 hours 03/01/23 1009 03/04/23 0856        Medications  Scheduled Meds:  apixaban  5 mg Oral BID   Chlorhexidine Gluconate Cloth  6 each Topical QHS   feeding supplement  237 mL Oral BID BM   folic acid  1 mg Oral Daily   levothyroxine  100 mcg Oral Q0600   methylPREDNISolone  30 mg Oral Daily   mirtazapine  30 mg Oral QHS   senna  1 tablet Oral  Daily   Continuous Infusions: PRN Meds:.acetaminophen **OR** acetaminophen, albuterol, ondansetron **OR** ondansetron (ZOFRAN) IV, mouth rinse, polyvinyl alcohol, sodium chloride flush    Subjective:   Hayley Wu was seen and examined today. No new complaints.   Objective:   Vitals:   03/15/23 1342 03/15/23 2004 03/16/23 0546 03/16/23 1235  BP: 117/70 130/78 131/77 125/88  Pulse: 89 96 72 86  Resp:  16 20 18   Temp: 98.1 F (36.7 C) 98.6 F (37 C) 98 F (36.7 C) 98.9 F (37.2 C)  TempSrc: Oral  Oral Oral  SpO2: 96% 95% 95% 94%  Weight:      Height:        Intake/Output Summary (Last 24 hours) at 03/16/2023 1427 Last data filed at 03/16/2023 0810 Gross per 24 hour  Intake 480 ml  Output 1300 ml  Net -820 ml   Filed Weights   03/01/23 0844  03/01/23 2000 03/11/23 1318  Weight: 67.1 kg 66.8 kg 70.4 kg     Exam General exam: Appears calm and comfortable  Respiratory system: Clear to auscultation. Respiratory effort normal. Cardiovascular system: S1 & S2 heard, RRR.  Gastrointestinal system: Abdomen is nondistended, soft and nontender. Central nervous system: Alert and oriented. No focal neurological deficits. Extremities: Symmetric 5 x 5 power. Skin: No rashes,  Psychiatry: Mood & affect appropriate.        Data Reviewed:  I have personally reviewed following labs and imaging studies   CBC Lab Results  Component Value Date   WBC 9.1 03/11/2023   RBC 4.08 03/11/2023   HGB 12.3 03/11/2023   HCT 39.8 03/11/2023   MCV 97.5 03/11/2023   MCH 30.1 03/11/2023   PLT 157 03/11/2023   MCHC 30.9 03/11/2023   RDW 15.4 03/11/2023   LYMPHSABS 0.7 03/11/2023   MONOABS 0.7 03/11/2023   EOSABS 0.1 03/11/2023   BASOSABS 0.0 03/11/2023     Last metabolic panel Lab Results  Component Value Date   NA 136 03/11/2023   K 3.8 03/11/2023   CL 102 03/11/2023   CO2 26 03/11/2023   BUN 20 03/11/2023   CREATININE 0.57 03/11/2023   GLUCOSE 122 (H) 03/11/2023    GFRNONAA >60 03/11/2023   GFRAA >90 11/25/2013   CALCIUM 8.5 (L) 03/11/2023   PROT 5.8 (L) 03/02/2023   ALBUMIN 3.3 (L) 03/02/2023   BILITOT 1.0 03/02/2023   ALKPHOS 59 03/02/2023   AST 17 03/02/2023   ALT 13 03/02/2023   ANIONGAP 8 03/11/2023    CBG (last 3)  No results for input(s): "GLUCAP" in the last 72 hours.    Coagulation Profile: No results for input(s): "INR", "PROTIME" in the last 168 hours.   Radiology Studies: No results found.     Kathlen Mody M.D. [facility] 03/16/2023, 2:27 PM  Available via Epic secure chat 7am-7pm After 7 pm, please refer to night coverage provider listed on amion.

## 2023-03-16 NOTE — Plan of Care (Signed)
  Problem: Health Behavior/Discharge Planning: Goal: Ability to manage health-related needs will improve Outcome: Progressing   

## 2023-03-17 ENCOUNTER — Inpatient Hospital Stay (HOSPITAL_COMMUNITY): Payer: PPO

## 2023-03-17 DIAGNOSIS — E039 Hypothyroidism, unspecified: Secondary | ICD-10-CM | POA: Diagnosis not present

## 2023-03-17 DIAGNOSIS — I10 Essential (primary) hypertension: Secondary | ICD-10-CM | POA: Diagnosis not present

## 2023-03-17 DIAGNOSIS — D649 Anemia, unspecified: Secondary | ICD-10-CM | POA: Diagnosis not present

## 2023-03-17 DIAGNOSIS — J9601 Acute respiratory failure with hypoxia: Secondary | ICD-10-CM | POA: Diagnosis not present

## 2023-03-17 MED ORDER — METHYLPREDNISOLONE 4 MG PO TABS
20.0000 mg | ORAL_TABLET | Freq: Every day | ORAL | Status: AC
  Administered 2023-03-18 – 2023-03-20 (×3): 20 mg via ORAL
  Filled 2023-03-17 (×3): qty 1

## 2023-03-17 NOTE — Progress Notes (Signed)
Occupational Therapy Treatment Patient Details Name: Hayley Wu MRN: [id number] DOB: 14-Nov-1950 Today's Date: 03/17/2023   History of present illness Pt is 72 yo female admitted on 03/05/23 with acute respiratory failure and found to have new bil pulmonary infiltrates.  Pt with bil acute DVT started on Eliquis and subacute L PE.  Pt with hx including but not limited to stage IV adenocarcinoma lung, hepatic and bony metastases that required T7 laminectomy due to spinal cord compression 09/2022   OT comments  Patient was able to make progress with sit to stands and transfers from lower surface heights to increase independence in toileting transfers and reduce caregiver burden in next level of care. Patient was motivated and had good carryover of education. Patient was educated on ECT in next level of care. Patient verbalized understanding. Patient would continue to benefit from skilled OT services at this time while admitted and after d/c to address noted deficits in order to improve overall safety and independence in ADLs. Patient will benefit from continued inpatient follow up therapy, <3 hours/day.         If plan is discharge home, recommend the following:  A little help with walking and/or transfers;Assist for transportation;Assistance with cooking/housework;A little help with bathing/dressing/bathroom         Precautions / Restrictions Precautions Precautions: Fall Precaution Comments: monitor sats Restrictions Weight Bearing Restrictions: No              ADL either performed or assessed with clinical judgement   ADL Overall ADL's : Needs assistance/impaired       General ADL Comments: patient was able to teach therapist about her pulse ox and what levels she needed to look out for with good carryover during session and duing activity.      Cognition Arousal: Alert Behavior During Therapy: WFL for tasks assessed/performed Overall Cognitive Status: Within Functional  Limits for tasks assessed          Exercises Other Exercises Other Exercises: patients session focused on sit to stands with proper positioning for hands. patient was able to carryover education 95% of sit to stands with occasional cues. patient was plesant and motivated with supervision for transitions Other Exercises: patient was able to complete stand pivot transfers to standard plastic chair in room with supervision with increased time. patient needed no physical assist with chair sitting around 17 inches high.            Pertinent Vitals/ Pain       Pain Assessment Pain Assessment: No/denies pain         Frequency  Min 1X/week        Progress Toward Goals  OT Goals(current goals can now be found in the care plan section)  Progress towards OT goals: Progressing toward goals     Plan         AM-PAC OT "6 Clicks" Daily Activity     Outcome Measure   Help from another person eating meals?: None Help from another person taking care of personal grooming?: A Little Help from another person toileting, which includes using toliet, bedpan, or urinal?: A Little Help from another person bathing (including washing, rinsing, drying)?: A Lot Help from another person to put on and taking off regular upper body clothing?: A Little Help from another person to put on and taking off regular lower body clothing?: A Little 6 Click Score: 18    End of Session Equipment Utilized During Treatment: Rolling walker (2 wheels);Oxygen  OT  Visit Diagnosis: Unsteadiness on feet (R26.81);Muscle weakness (generalized) (M62.81)   Activity Tolerance Patient tolerated treatment well   Patient Left in bed;with call bell/phone within reach   Nurse Communication Mobility status        Time: 6045-4098 OT Time Calculation (min): 21 min  Charges: OT General Charges $OT Visit: 1 Visit OT Treatments $Self Care/Home Management : 8-22 mins  Rosalio Loud, MS Acute Rehabilitation  Department Office# [phone]   Selinda Flavin 03/17/2023, 4:45 PM

## 2023-03-17 NOTE — Progress Notes (Signed)
[facility]                                                                               Hayley Wu, is a 72 y.o. 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 Patient also having cough occasionally productive, mild wheezing.  She has been prescribed mirtazapine recently for low appetite. In the ED: Hypoxic 50% on room air placed on nonrebreather.  Labs showed hypokalemia stable renal function albumin 2.8 Pro-Cal less than 0.1 lactic acid 0.9 BNP 152 CBC with hemoglobin 10.8 influenza COVID-19 screen negative, UA with WBC more than 50 large leukocytes. chest x-ray>> bilateral opacities right more than left concerning for pneumonia. CTA>> occluded left lower lobe pulmonary artery branch since 7/14, stable collapse of T7 with paraspinal soft tissue density and acute kyphosis, interval change of CHF, cardiomegaly interstitial pulmonary edema minimal right pleural effusion known primary lung carcinoma 2.9 cm right lower lobe stable and large lymph nodes, stable left humeral head sclerotic metastasis or infarct, COPD. Patient was wheezing and having rales on exam.  Placed on empiric antibiotic and admitted PCCM was consulted due to worsening hypoxia, echo obtained shows EF 55 to 60% G1 DD normal RV systolic function mitral valve normal aortic valve tricuspid. 9/22 Started on a steroid trial and will continue with empiric antibiotics.  Echocardiogram normal LV RV function.   Patient seen and examined today.  No new  complaints.  Awaiting for the appeal process.   Assessment & Plan    Assessment and Plan:     Acute respiratory distress with acute hypoxic respiratory failure secondary to multifocal bilateral bronchopneumonia/pulmonary infiltrates Adenocarcinoma of right lung stage IV: Imaging shows bony spinal mets pulmonary nodules and lymphadenopathy along with bilateral multifocal opacities Differentials:pneumonia, opportunistic infections, pneumonitis due to either radiation or immunotherapy.BNP was up however echo with normal LV RV function got few doses of IV Lasix initially.  PCCM managing appreciate input doing high-dose steroid trial with 125 mg x 3 days.  Patient on Solu-Medrol 60 mg IV daily since 03/05/2023 per PCCM recommendation, they also recommend that patient will eventually need prednisolone for 3 weeks.  Started tapering steroids. Currently on 20 mg daily of medrol. Repeat CXR unchanged. Tried to wean her off oxygen but her sats are low in 85%. She appears very deconditioned and would require short term rehab.  She was assessed by PT OT who recommended SNF.  TOC has been consulted.  Waiting for insurance authorization. Continue supplemental oxygen bronchodilators, anticoagulant Eliquis.  Since procalcitonin negative-antibiotic  stopped 9/24. Dr Arbutus Ped aware of admission and agree w/ above management> per PCCM depending upon workup may need to consider change from her current immunotherapy.    Cardiomegaly: ECHO done and reviewed.  Anemia Initial level of 11.4, probably dilutional.  Hemoglobin has been stable around 12.    Acute bilateral LE DVT left common femoral and left proximal profunda vein Newly identified subacute left pulmonary embolism: Ct Occluded LLL pulm artery was since July. Continue with eliquis.     Hypertension: BP parameters are optimal   Hypothyroidism: Continue levothyroxine   Hypokalemia: Resolved, repeat K WNL.    Constipation: Continue stool  softener, last BM 9/19   Protein malnutrition moderate: Augment diet, continue Remeron at bedtime  Nutrition Status: Goals of care: Previous hospitalist had discussed CODE STATUS with the patient 9/22 and she wished to be DNR no CPR or intubation, patient's 2 daughters at the bedside In agreement.     RN Pressure Injury Documentation:    Malnutrition Type:  Nutrition Problem: Moderate Malnutrition Etiology: chronic illness (stage IV adenocarcinoma of lung)   Malnutrition Characteristics:  Signs/Symptoms: mild fat depletion, mild muscle depletion   Nutrition Interventions:  Interventions: Refer to RD note for recommendations, Ensure Enlive (each supplement provides 350kcal and 20 grams of protein)  Estimated body mass index is 27.49 kg/m as calculated from the following:   Height as of this encounter: 5\' 3"  (1.6 m).   Weight as of this encounter: 70.4 kg.  Code Status: DNR limited.  DVT Prophylaxis:   apixaban (ELIQUIS) tablet 5 mg   Level of Care: Level of care: Med-Surg Family Communication: none at bedside.   Disposition Plan:     Remains inpatient appropriate:  waiting for SNF. She was denied and , she is appealing the decision.   Procedures:  None.   Consultants:   None.  Antimicrobials:   Anti-infectives (From admission, onward)    Start     Dose/Rate Route Frequency Ordered Stop   03/01/23 1015  azithromycin (ZITHROMAX) 500 mg in sodium chloride 0.9 % 250 mL IVPB  Status:  Discontinued        500 mg 250 mL/hr over 60 Minutes Intravenous Every 24 hours 03/01/23 1009 03/04/23 0856   03/01/23 1015  cefTRIAXone (ROCEPHIN) 1 g in sodium chloride 0.9 % 100 mL IVPB  Status:  Discontinued        1 g 200 mL/hr over 30 Minutes Intravenous Every 24 hours 03/01/23 1009 03/04/23 0856        Medications  Scheduled Meds:  apixaban  5 mg Oral BID   Chlorhexidine Gluconate Cloth  6 each Topical QHS   feeding supplement  237 mL Oral BID BM   folic acid  1 mg  Oral Daily   levothyroxine  100 mcg Oral Q0600   mirtazapine  30 mg Oral QHS   senna  1 tablet Oral Daily   Continuous Infusions: PRN Meds:.acetaminophen **OR** acetaminophen, albuterol, ondansetron **OR** ondansetron (ZOFRAN) IV, mouth rinse, polyvinyl alcohol, sodium chloride flush    Subjective:   Thella Mcclaine was seen and examined today. No new complaints.   Objective:   Vitals:   03/16/23 1235 03/16/23 1958 03/17/23 0504 03/17/23 1351  BP: 125/88 139/82 (!) 144/81 132/81  Pulse: 86 (!) 102 74 90  Resp: 18 20 16 20   Temp: 98.9 F (37.2 C) 98.4 F (36.9 C) 97.7 F (36.5 C) 98.3 F (36.8 C)  TempSrc: Oral Oral Oral Oral  SpO2: 94% 95% 95% 95%  Weight:      Height:       No intake or output data in the 24 hours ending 03/17/23 1704  Filed Weights   03/01/23 0844 03/01/23 2000 03/11/23  1318  Weight: 67.1 kg 66.8 kg 70.4 kg     Exam General exam: Appears calm and comfortable  Respiratory system: diminished at bases, on 2lit of [place] oxygen.  Cardiovascular system: S1 & S2 heard, RRR. No JVD, murmurs, rubs, gallops or clicks. No pedal edema. Gastrointestinal system: Abdomen is nondistended, soft and nontender. No organomegaly or masses felt. Normal bowel sounds heard. Central nervous system: Alert and oriented. No focal neurological deficits. Extremities: Symmetric 5 x 5 power. Skin: No rashes, lesions or ulcers Psychiatry: Judgement and insight appear normal. Mood & affect appropriate.         Data Reviewed:  I have personally reviewed following labs and imaging studies   CBC Lab Results  Component Value Date   WBC 9.1 03/11/2023   RBC 4.08 03/11/2023   HGB 12.3 03/11/2023   HCT 39.8 03/11/2023   MCV 97.5 03/11/2023   MCH 30.1 03/11/2023   PLT 157 03/11/2023   MCHC 30.9 03/11/2023   RDW 15.4 03/11/2023   LYMPHSABS 0.7 03/11/2023   MONOABS 0.7 03/11/2023   EOSABS 0.1 03/11/2023   BASOSABS 0.0 03/11/2023     Last metabolic panel Lab Results   Component Value Date   NA 136 03/11/2023   K 3.8 03/11/2023   CL 102 03/11/2023   CO2 26 03/11/2023   BUN 20 03/11/2023   CREATININE 0.57 03/11/2023   GLUCOSE 122 (H) 03/11/2023   GFRNONAA >60 03/11/2023   GFRAA >90 11/25/2013   CALCIUM 8.5 (L) 03/11/2023   PROT 5.8 (L) 03/02/2023   ALBUMIN 3.3 (L) 03/02/2023   BILITOT 1.0 03/02/2023   ALKPHOS 59 03/02/2023   AST 17 03/02/2023   ALT 13 03/02/2023   ANIONGAP 8 03/11/2023    CBG (last 3)  No results for input(s): "GLUCAP" in the last 72 hours.    Coagulation Profile: No results for input(s): "INR", "PROTIME" in the last 168 hours.   Radiology Studies: DG CHEST PORT 1 VIEW  Result Date: 03/17/2023 CLINICAL DATA:  Respiratory distress. EXAM: PORTABLE CHEST 1 VIEW COMPARISON:  03/06/2023 FINDINGS: Right chest wall port a catheter noted with tip at the superior cavoatrial junction. Stable cardiomediastinal contours. Increased lung volumes compared with the previous exam. Bilateral increase interstitial markings are similar to the previous exam without focal consolidative change. Chronic interstitial changes of COPD/emphysema. IMPRESSION: 1. Increased lung volumes compared with the previous exam. 2. Persistent increased interstitial markings within both lungs which may reflect sequelae of atypical inflammation/infection versus interstitial edema. Electronically Signed   By: Signa Kell M.D.   On: 03/17/2023 10:40       Kathlen Mody M.D. [facility] 03/17/2023, 5:04 PM  Available via Epic secure chat 7am-7pm After 7 pm, please refer to night coverage provider listed on amion.

## 2023-03-17 NOTE — Progress Notes (Signed)
Physical Therapy Treatment Patient Details Name: Hayley Wu MRN: [id number] DOB: 07-09-1950 Today's Date: 03/17/2023   History of Present Illness Pt is 72 yo female admitted on 03/05/23 with acute respiratory failure and found to have new bil pulmonary infiltrates.  Pt with bil acute DVT started on Eliquis and subacute L PE.  Pt with hx including but not limited to stage IV adenocarcinoma lung, hepatic and bony metastases that required T7 laminectomy due to spinal cord compression 09/2022    PT Comments  General Comments: AxO x 3 pleasant Lady who was living home alone Indep Pt sitting EOB on 1 lt with sats 96%.  Assisted with amb in hallway.  General Gait Details: decreased amb distance this session due to increased c/o weakness.  Pt c/o "I feel like my legs are going to give out".  Pt present with excessive lean on walker and c/o mild "dizziness" with RR 36.  Trial RA during amb decreased to 85%.  Pt required 2 lts to achieve sats in therapeutic range.  VERY limited activity tolerance compared to prior hospital admission.  Deconditioned.  Increased gait instability with increased distance.  HIGH FALL RISK.  Extended rest break for recovery. Pt will need ST Rehab at SNF to address mobility and functional decline prior to safely returning home.   SATURATION QUALIFICATIONS: (This note is used to comply with regulatory documentation for home oxygen)  Patient Saturations on Room Air at Rest = 92%  Patient Saturations on Room Air while Ambulating = 85%  Patient Saturations on 2 Liters of oxygen while Ambulating = 90%  Please briefly explain why patient needs home oxygen:  pt requires supplemental oxygen to achieve therapeutic levels.     If plan is discharge home, recommend the following: A little help with walking and/or transfers;A little help with bathing/dressing/bathroom;Assistance with cooking/housework;Help with stairs or ramp for entrance   Can travel by private vehicle     Yes   Equipment Recommendations  None recommended by PT    Recommendations for Other Services       Precautions / Restrictions Precautions Precautions: Fall Precaution Comments: monitor sats Restrictions Weight Bearing Restrictions: No     Mobility  Bed Mobility Overal bed mobility: Needs Assistance Bed Mobility: Supine to Sit, Sit to Supine     Supine to sit: Supervision Sit to supine: Supervision   General bed mobility comments: pt able to self move B LE in and out of bed but requires a rest break between each activity due to dyspnea.    Transfers Overall transfer level: Needs assistance Equipment used: Rolling walker (2 wheels) Transfers: Sit to/from Stand Sit to Stand: Contact guard assist           General transfer comment: pt able to self rise but requires extra support using B UE's (heavy lean) and able to transfer to near by [facility] but fatigues easily requiring an extended rest break after.    Ambulation/Gait Ambulation/Gait assistance: Min assist Gait Distance (Feet): 28 Feet Assistive device: Rolling walker (2 wheels) Gait Pattern/deviations: Step-to pattern, Decreased stride length, Trunk flexed, Ataxic Gait velocity: decreased     General Gait Details: decreased amb distance this session due to increased c/o weakness.  Pt c/o "I feel like my legs are going to give out".  Pt present with excessive lean on walker and c/o mild "dizziness" with RR 36.  Trial RA during amb decreased to 85%.  Pt required 2 lts to achieve sats in therapeutic range.  VERY limited  activity tolerance compared to prior hospital admission.  Deconditioned.  Increased gait instability with increased distance.  HIGH FALL RISK.  Extended rest break for recovery.   Stairs             Wheelchair Mobility     Tilt Bed    Modified Rankin (Stroke Patients Only)       Balance                                            Cognition Arousal: Alert   Overall  Cognitive Status: Within Functional Limits for tasks assessed                                 General Comments: AxO x 3 pleasant Lady who was living home alone Indep        Exercises      General Comments        Pertinent Vitals/Pain Pain Assessment Pain Assessment: No/denies pain    Home Living                          Prior Function            PT Goals (current goals can now be found in the care plan section) Progress towards PT goals: Progressing toward goals    Frequency    Min 1X/week      PT Plan      Co-evaluation              AM-PAC PT "6 Clicks" Mobility   Outcome Measure  Help needed turning from your back to your side while in a flat bed without using bedrails?: None Help needed moving from lying on your back to sitting on the side of a flat bed without using bedrails?: A Little Help needed moving to and from a bed to a chair (including a wheelchair)?: A Lot Help needed standing up from a chair using your arms (e.g., wheelchair or bedside chair)?: A Lot Help needed to walk in hospital room?: A Lot Help needed climbing 3-5 steps with a railing? : A Lot 6 Click Score: 15    End of Session Equipment Utilized During Treatment: Gait belt Activity Tolerance: Patient limited by fatigue Patient left: in bed;with call bell/phone within reach Nurse Communication: Mobility status PT Visit Diagnosis: Other abnormalities of gait and mobility (R26.89)     Time: 1610-9604 PT Time Calculation (min) (ACUTE ONLY): 24 min  Charges:    $Gait Training: 8-22 mins $Therapeutic Activity: 8-22 mins PT General Charges $$ ACUTE PT VISIT: 1 Visit                     Felecia Shelling  PTA Acute  Rehabilitation Services Office M-F          [phone]

## 2023-03-17 NOTE — TOC Progression Note (Addendum)
Transition of Care [facility]) - Progression Note    Patient Details  Name: Hayley Wu MRN: [id number] Date of Birth: February 04, 1951  [facility] CM/SW Contact  Howell Rucks, RN Phone Number: 03/17/2023, 9:25 AM  Clinical Narrative:  Call received from Rob with [employer] and Teena Dunk, reports appeal reviewed by MD, denial upheld, reports sent for second level appeal review on Saturday, 03/15/23, will be notified within 72 hrs of determination, team notified.  -2:44pm Met with pt at bedside, updated received call from appeals at [employer],  informed denial upheld for level 1 appeal,  now being reviewed at leve 2 appeal, await determination, pt voiced understanding. TOC will continue to follow.          Expected Discharge Plan and Services                                               Social Determinants of Health (SDOH) Interventions SDOH Screenings   Food Insecurity: No Food Insecurity (03/01/2023)  Housing: Low Risk  (03/01/2023)  Transportation Needs: No Transportation Needs (03/01/2023)  Utilities: Not At Risk (03/01/2023)  Depression (PHQ2-9): Low Risk  (10/02/2022)  Tobacco Use: High Risk (03/01/2023)    Readmission Risk Interventions    03/04/2023    6:23 PM  Readmission Risk Prevention Plan  Transportation Screening Complete  PCP or Specialist Appt within 3-5 Days Complete  HRI or Home Care Consult Complete  Social Work Consult for Recovery Care Planning/Counseling Complete  Palliative Care Screening Not Applicable  Medication Review Oceanographer) Complete

## 2023-03-18 DIAGNOSIS — J9601 Acute respiratory failure with hypoxia: Secondary | ICD-10-CM | POA: Diagnosis not present

## 2023-03-18 DIAGNOSIS — D649 Anemia, unspecified: Secondary | ICD-10-CM | POA: Diagnosis not present

## 2023-03-18 DIAGNOSIS — I10 Essential (primary) hypertension: Secondary | ICD-10-CM | POA: Diagnosis not present

## 2023-03-18 DIAGNOSIS — E039 Hypothyroidism, unspecified: Secondary | ICD-10-CM | POA: Diagnosis not present

## 2023-03-18 NOTE — Plan of Care (Signed)
  Problem: Pain Managment: Goal: General experience of comfort will improve Outcome: Progressing   Problem: Skin Integrity: Goal: Risk for impaired skin integrity will decrease Outcome: Progressing   Problem: Activity: Goal: Ability to tolerate increased activity will improve Outcome: Progressing

## 2023-03-18 NOTE — Progress Notes (Addendum)
[facility]                                                                               Hayley Wu, is a 72 y.o. 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 She was admitted for acute respiratory failure with hypoxia sec to bil bronchopneumonia vs pneumonitis from immunotherapy. PCCM consulted and recommendations given.   She was treated with antibiotics and steroids and currently on a tapering dose of steroids.  Therapy evaluations recommending SNF, insurance denied and she appealed.  She is medically stable for discharge.    Assessment & Plan    Assessment and Plan:     Acute respiratory distress with acute hypoxic respiratory failure secondary to multifocal bilateral bronchopneumonia/pulmonary infiltrates in the setting of  Adenocarcinoma of right lung stage IV: Imaging shows bony spinal mets pulmonary nodules and lymphadenopathy along with bilateral multifocal opacities Differentials:pneumonia, opportunistic infections, pneumonitis due to either radiation or immunotherapy.BNP was up however echo with normal LV RV function got few doses of IV Lasix initially.  Echocardiogram normal LV RV function.  PCCM  consulted and recommended doing high-dose steroid trial with 125 mg x 3 days.   Patient on Solu-Medrol 60 mg IV daily since 03/05/2023 per PCCM recommendation, they also recommend that patient will eventually need prednisolone  taper for 3 weeks.  Started tapering steroids. Currently on 20 mg daily of  medrol. Repeat CXR unchanged.  Tried to wean her off oxygen but her sats are low in 85% on ambulation. She appears very deconditioned and would require short term rehab.  She was assessed by PT OT who recommended SNF.  TOC has been consulted.  Waiting for insurance authorization. Meanwhile Continue supplemental oxygen bronchodilators, anticoagulant Eliquis.   Dr Arbutus Ped aware of admission and agree w/ above management, plans to start her on treatment next week once she improves  and possibly consider removing Keytruda from treatment plan and continue with Alimta only due to her episode of pneumonitis.     Cardiomegaly: ECHO done and reviewed.    Anemia Resolved.    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 Continue with eliquis.     Hypertension: BP parameters are optimal.    Hypothyroidism: Continue levothyroxine   Hypokalemia: Resolved, repeat K WNL.    Constipation: Continue stool softener, last BM 9/19   Protein malnutrition moderate: Augment diet, continue Remeron at bedtime  Nutrition Status: Goals of care: Previous hospitalist had discussed CODE STATUS with the patient 9/22 and she wished to be DNR no CPR or intubation, patient's 2 daughters at the bedside In agreement.   Malnutrition Type:  Nutrition Problem: Moderate Malnutrition Etiology: chronic illness (stage IV adenocarcinoma of lung)   Malnutrition Characteristics:  Signs/Symptoms: mild fat depletion, mild muscle depletion   Nutrition Interventions:  Interventions: Refer to RD note for recommendations, Ensure Enlive (each supplement provides 350kcal and 20 grams of protein)  Estimated body mass index is 27.49 kg/m as calculated from the following:   Height as of this encounter: 5\' 3"  (1.6 m).   Weight as of this encounter: 70.4 kg.  Code Status: DNR limited.   DVT Prophylaxis:   apixaban (ELIQUIS) tablet 5 mg   Level of Care: Level of care: Med-Surg Family Communication: none at bedside.   Disposition Plan:     Remains inpatient appropriate:  waiting for SNF. She was denied and , she is appealing the decision.   Procedures:  None.   Consultants:   None.  Antimicrobials:   Anti-infectives (From admission, onward)    Start     Dose/Rate Route Frequency Ordered Stop   03/01/23 1015  azithromycin (ZITHROMAX) 500 mg in sodium chloride 0.9 % 250 mL IVPB  Status:  Discontinued        500 mg 250 mL/hr over 60 Minutes Intravenous Every 24 hours 03/01/23 1009 03/04/23 0856   03/01/23 1015  cefTRIAXone (ROCEPHIN) 1 g in sodium chloride 0.9 % 100 mL IVPB  Status:  Discontinued        1 g 200 mL/hr over 30 Minutes Intravenous Every 24 hours 03/01/23 1009 03/04/23 0856        Medications  Scheduled Meds:  apixaban  5 mg Oral BID   Chlorhexidine Gluconate Cloth  6 each Topical QHS   feeding supplement  237 mL Oral BID BM   folic acid  1 mg Oral Daily   levothyroxine  100 mcg Oral Q0600   methylPREDNISolone  20 mg Oral Daily   mirtazapine  30 mg Oral QHS   senna  1 tablet Oral Daily   Continuous Infusions: PRN Meds:.acetaminophen **OR** acetaminophen, albuterol, ondansetron **OR** ondansetron (ZOFRAN) IV, mouth rinse, polyvinyl alcohol, sodium chloride flush    Subjective:   Hayley Wu was seen and examined today. No new complaints.   Objective:   Vitals:   03/17/23 1351 03/17/23 1955 03/18/23 0256 03/18/23 1257  BP: 132/81 123/72 139/76 (!) 158/80  Pulse: 90 86 73 84  Resp: 20 20 16 20   Temp: 98.3 F (36.8 C) 98.7 F (37.1 C) 98.2 F (36.8 C) 98.1 F (36.7 C)  TempSrc: Oral Oral Oral Oral  SpO2: 95% 92% 92% 91%  Weight:      Height:        Intake/Output Summary (Last 24 hours) at 03/18/2023 1454 Last data filed at 03/18/2023 1300 Gross per 24 hour  Intake 120 ml  Output --  Net 120 ml    Filed Weights    03/01/23 0844 03/01/23 2000 03/11/23 1318  Weight: 67.1 kg 66.8 kg 70.4 kg     Exam General exam: Appears calm and comfortable  Respiratory system: Clear to auscultation. Respiratory effort normal. Cardiovascular system: S1 & S2 heard, RRR.  Gastrointestinal system: Abdomen is nondistended, soft and nontender. Central nervous system: Alert and oriented. No focal neurological deficits. Extremities: Symmetric 5 x 5 power. Skin: No rashes,  Psychiatry: Mood & affect appropriate.         Data Reviewed:  I have personally reviewed following labs and imaging studies   CBC Lab Results  Component Value Date   WBC 9.1 03/11/2023   RBC 4.08 03/11/2023  HGB 12.3 03/11/2023   HCT 39.8 03/11/2023   MCV 97.5 03/11/2023   MCH 30.1 03/11/2023   PLT 157 03/11/2023   MCHC 30.9 03/11/2023   RDW 15.4 03/11/2023   LYMPHSABS 0.7 03/11/2023   MONOABS 0.7 03/11/2023   EOSABS 0.1 03/11/2023   BASOSABS 0.0 03/11/2023     Last metabolic panel Lab Results  Component Value Date   NA 136 03/11/2023   K 3.8 03/11/2023   CL 102 03/11/2023   CO2 26 03/11/2023   BUN 20 03/11/2023   CREATININE 0.57 03/11/2023   GLUCOSE 122 (H) 03/11/2023   GFRNONAA >60 03/11/2023   GFRAA >90 11/25/2013   CALCIUM 8.5 (L) 03/11/2023   PROT 5.8 (L) 03/02/2023   ALBUMIN 3.3 (L) 03/02/2023   BILITOT 1.0 03/02/2023   ALKPHOS 59 03/02/2023   AST 17 03/02/2023   ALT 13 03/02/2023   ANIONGAP 8 03/11/2023    CBG (last 3)  No results for input(s): "GLUCAP" in the last 72 hours.    Coagulation Profile: No results for input(s): "INR", "PROTIME" in the last 168 hours.   Radiology Studies: DG CHEST PORT 1 VIEW  Result Date: 03/17/2023 CLINICAL DATA:  Respiratory distress. EXAM: PORTABLE CHEST 1 VIEW COMPARISON:  03/06/2023 FINDINGS: Right chest wall port a catheter noted with tip at the superior cavoatrial junction. Stable cardiomediastinal contours. Increased lung volumes compared with the previous exam.  Bilateral increase interstitial markings are similar to the previous exam without focal consolidative change. Chronic interstitial changes of COPD/emphysema. IMPRESSION: 1. Increased lung volumes compared with the previous exam. 2. Persistent increased interstitial markings within both lungs which may reflect sequelae of atypical inflammation/infection versus interstitial edema. Electronically Signed   By: Signa Kell M.D.   On: 03/17/2023 10:40       Kathlen Mody M.D. [facility] 03/18/2023, 2:54 PM  Available via Epic secure chat 7am-7pm After 7 pm, please refer to night coverage provider listed on amion.

## 2023-03-19 DIAGNOSIS — E440 Moderate protein-calorie malnutrition: Secondary | ICD-10-CM

## 2023-03-19 DIAGNOSIS — K5900 Constipation, unspecified: Secondary | ICD-10-CM

## 2023-03-19 DIAGNOSIS — E876 Hypokalemia: Secondary | ICD-10-CM | POA: Diagnosis not present

## 2023-03-19 DIAGNOSIS — J9601 Acute respiratory failure with hypoxia: Secondary | ICD-10-CM | POA: Diagnosis not present

## 2023-03-19 DIAGNOSIS — I10 Essential (primary) hypertension: Secondary | ICD-10-CM | POA: Diagnosis not present

## 2023-03-19 DIAGNOSIS — C3491 Malignant neoplasm of unspecified part of right bronchus or lung: Secondary | ICD-10-CM | POA: Diagnosis not present

## 2023-03-19 DIAGNOSIS — I82413 Acute embolism and thrombosis of femoral vein, bilateral: Secondary | ICD-10-CM

## 2023-03-19 NOTE — TOC Progression Note (Signed)
Transition of Care [facility]) - Progression Note    Patient Details  Name: Hayley Wu MRN: [id number] Date of Birth: 02-14-1951  Transition of Care [facility]) CM/SW Contact  Howell Rucks, RN Phone Number: 03/19/2023, 1:49 PM  Clinical Narrative: Call received from Amy with [employer], reports need letter stating on company letter head stating NCM is submitting appeal on behalf of the attending physician and/or organization to move forward with second level appeal. Case discussed with [facility] supervisor. Letter composed and faxed to [employer] at [phone] as requested.           Expected Discharge Plan and Services                                               Social Determinants of Health (SDOH) Interventions SDOH Screenings   Food Insecurity: No Food Insecurity (03/01/2023)  Housing: Low Risk  (03/01/2023)  Transportation Needs: No Transportation Needs (03/01/2023)  Utilities: Not At Risk (03/01/2023)  Depression (PHQ2-9): Low Risk  (10/02/2022)  Tobacco Use: High Risk (03/01/2023)    Readmission Risk Interventions    03/04/2023    6:23 PM  Readmission Risk Prevention Plan  Transportation Screening Complete  PCP or Specialist Appt within 3-5 Days Complete  HRI or Home Care Consult Complete  Social Work Consult for Recovery Care Planning/Counseling Complete  Palliative Care Screening Not Applicable  Medication Review [employer]) Complete

## 2023-03-19 NOTE — Progress Notes (Signed)
Occupational Therapy Treatment Patient Details Name: Hayley Wu MRN: [id number] DOB: 01-18-1951 Today's Date: 03/19/2023   History of present illness Pt is 72 yo female admitted on 03/05/23 with acute respiratory failure and found to have new bil pulmonary infiltrates.  Pt with bil acute DVT started on Eliquis and subacute L PE.  Pt with hx including but not limited to stage IV adenocarcinoma lung, hepatic and bony metastases that required T7 laminectomy due to spinal cord compression 09/2022   OT comments  Patient was able to engage in bathing tasks sitting EOB with no SOB noted. Patient when engaging in theraputic activity to increase functional activity tolerance was able to recognize her SOB and stop to check O2 and manage levels appropriately during session with MI. Patient was educated that this was important to keep up with at all times. Patient verbalized understanding. Patient's discharge plan remains appropriate at this time. OT will continue to follow acutely.        If plan is discharge home, recommend the following:  A little help with walking and/or transfers;Assist for transportation;Assistance with cooking/housework;A little help with bathing/dressing/bathroom   Equipment Recommendations  None recommended by OT       Precautions / Restrictions Precautions Precautions: Fall Precaution Comments: monitor sats Restrictions Weight Bearing Restrictions: No       Mobility Bed Mobility Overal bed mobility: Modified Independent                          ADL either performed or assessed with clinical judgement   ADL Overall ADL's : Needs assistance/impaired     Grooming: Set up;Sitting;Wash/dry face;Oral care;Wash/dry hands Grooming Details (indicate cue type and reason): EOB Upper Body Bathing: Set up;Sitting Upper Body Bathing Details (indicate cue type and reason): EOB Lower Body Bathing: Set up;Supervison/ safety;Sitting/lateral leans Lower Body  Bathing Details (indicate cue type and reason): EOB Upper Body Dressing : Set up;Sitting         General ADL Comments: patient participated in 8 sit to stands with supervision with RW with bed at lowest level. patient was able to identify her SOB and reach for her personal pulse ox to check her status. she was 89% on 1L/min after all tasks completed above. patient was able to deep breath back up over 90% within 30 seconds. good awareness of self demonstrated today.      Cognition Arousal: Alert Behavior During Therapy: WFL for tasks assessed/performed Overall Cognitive Status: Within Functional Limits for tasks assessed                       Pertinent Vitals/ Pain       Pain Assessment Pain Assessment: No/denies pain         Frequency  Min 1X/week        Progress Toward Goals  OT Goals(current goals can now be found in the care plan section)  Progress towards OT goals: Progressing toward goals     Plan         AM-PAC OT "6 Clicks" Daily Activity     Outcome Measure   Help from another person eating meals?: None Help from another person taking care of personal grooming?: A Little Help from another person toileting, which includes using toliet, bedpan, or urinal?: A Little Help from another person bathing (including washing, rinsing, drying)?: A Little Help from another person to put on and taking off regular upper body clothing?: A Little Help  from another person to put on and taking off regular lower body clothing?: A Little 6 Click Score: 19    End of Session Equipment Utilized During Treatment: Rolling walker (2 wheels);Oxygen  OT Visit Diagnosis: Unsteadiness on feet (R26.81);Muscle weakness (generalized) (M62.81)   Activity Tolerance Patient tolerated treatment well   Patient Left in bed;with call bell/phone within reach   Nurse Communication Mobility status        Time: 6045-4098 OT Time Calculation (min): 17 min  Charges: OT General  Charges $OT Visit: 1 Visit OT Treatments $Self Care/Home Management : 8-22 mins  Rosalio Loud, MS Acute Rehabilitation Department Office# [phone]   Selinda Flavin 03/19/2023, 12:42 PM

## 2023-03-19 NOTE — Progress Notes (Signed)
Assumed care of pt from off going RN. Pt sitting up on the side of the bed  Pt denies needs at this time No changes in initial AM assessment at this time. Cont with plan of care.

## 2023-03-19 NOTE — Progress Notes (Signed)
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 She was admitted for acute respiratory failure with hypoxia sec to bil bronchopneumonia vs pneumonitis from immunotherapy. PCCM consulted and recommendations given.   She was treated with antibiotics and steroids and currently on a tapering dose of steroids.  Therapy evaluations recommending SNF, insurance denied and she appealed.  She is medically stable for discharge.      Assessment & Plan:   Principal Problem:   Acute respiratory failure with hypoxia (HCC) Active Problems:   Hypokalemia   Hypertension   Hypothyroidism   Adenocarcinoma of right lung, stage 4 (HCC)   Constipation   Normocytic anemia   Moderate protein malnutrition (HCC)   Occlusion of left pulmonary artery (HCC)   Cardiomegaly   Pleural effusion due to CHF (congestive heart failure) (HCC)   Acute deep vein thrombosis (DVT) of femoral vein of both lower extremities (HCC)  #1 acute hypoxic respiratory failure secondary to multifocal bilateral bronchopneumonia/pulmonary infiltrates in the setting of stage IV right lung adenocarcinoma -Imaging done noted bony spinal mets, pulmonary nodules, lymphadenopathy with bilateral multifocal opacities. -Differential include opportunistic infection versus pneumonitis either to radiation or immunotherapy versus pneumonia. -Patient noted to have elevated BNP 2D echo with normal LV, right ventricular function patient received some IV Lasix  initially. -Patient seen in consultation by PCCM who recommended high-dose steroid trial with 125 mg of Solu-Medrol x 3 days and then patient was placed on Solu-Medrol 60 mg IV daily since 03/05/2023 per PCCM recommendation who recommended eventual transition to prednisone taper for 3 weeks. -O2 was tried to be weaned off however sats of 85% on ambulation noted and patient appears very deconditioned and requiring short-term SNF. -Patient seen by PT OT recommending SNF, TOC consulted for SNF placement. -Continue oxygen, bronchodilators, Eliquis. -Patient's oncologist, Dr. Arlis Porta aware of admission and was in agreement with current management and plan to start on treatment once his symptoms improve next week and may consider removing Keytruda from treatment plan and continue with Alimta due to her episode of pneumonitis.  2.  Anemia -Resolved.  3.  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 -Continue anticoagulation with Eliquis.  4.  Hypertension -Stable.  5.  Hypothyroidism -Synthroid.  6.  Hypokalemia -Repleted.  7.  Constipation -Continue current bowel regimen.  8.  Moderate protein calorie malnutrition --Diet liberalized to a regular diet. -Continue Remeron nightly.    DVT prophylaxis: Eliquis Code Status: DNR Family Communication: Updated patient.  No family at bedside. Disposition: SNF  Status is: Inpatient Remains inpatient appropriate because: Severity of illness   Consultants:  PCCM: Dr. Delton Coombes 03/02/2023 Palliative care: Dr. Linna Darner 03/13/2023  Procedures:  CT angiogram chest 03/01/2023 Chest x-ray 03/01/2023, 03/02/2023, 03/06/2023, 03/17/2023 2D echo 03/02/2023 Lower extremity Dopplers 03/02/2023  Antimicrobials:  Anti-infectives (From admission, onward)    Start     Dose/Rate Route Frequency Ordered Stop    03/01/23 1015  azithromycin (ZITHROMAX) 500 mg  in sodium chloride 0.9 % 250 mL IVPB  Status:  Discontinued        500 mg 250 mL/hr over 60 Minutes Intravenous Every 24 hours 03/01/23 1009 03/04/23 0856   03/01/23 1015  cefTRIAXone (ROCEPHIN) 1 g in sodium chloride 0.9 % 100 mL IVPB  Status:  Discontinued        1 g 200 mL/hr over 30 Minutes Intravenous Every 24 hours 03/01/23 1009 03/04/23 0856         Subjective: Laying in bed.  Denies any chest pain or shortness of breath.  No abdominal pain.  Objective: Vitals:   03/18/23 1257 03/18/23 2114 03/19/23 0441 03/19/23 2116  BP: (!) 158/80 126/72 132/73 118/66  Pulse: 84 96 70 91  Resp: 20 17 17 19   Temp: 98.1 F (36.7 C)  98.3 F (36.8 C) 98.2 F (36.8 C)  TempSrc: Oral Oral Oral Oral  SpO2: 91% 92% 96% 93%  Weight:      Height:        Intake/Output Summary (Last 24 hours) at 03/19/2023 2212 Last data filed at 03/19/2023 2130 Gross per 24 hour  Intake 360 ml  Output --  Net 360 ml   Filed Weights   03/01/23 0844 03/01/23 2000 03/11/23 1318  Weight: 67.1 kg 66.8 kg 70.4 kg    Examination:  General exam: Appears calm and comfortable  Respiratory system: Clear to auscultation.  No wheezes, no crackles, no rhonchi.  Fair air movement.  Speaking in full sentences.  Respiratory effort normal. Cardiovascular system: S1 & S2 heard, RRR. No JVD, murmurs, rubs, gallops or clicks. No pedal edema. Gastrointestinal system: Abdomen is nondistended, soft and nontender. No organomegaly or masses felt. Normal bowel sounds heard. Central nervous system: Alert and oriented. No focal neurological deficits. Extremities: Symmetric 5 x 5 power. Skin: No rashes, lesions or ulcers Psychiatry: Judgement and insight appear normal. Mood & affect appropriate.     Data Reviewed: I have personally reviewed following labs and imaging studies  CBC: No results for input(s): "WBC", "NEUTROABS", "HGB", "HCT", "MCV", "PLT" in the last 168  hours.  Basic Metabolic Panel: No results for input(s): "NA", "K", "CL", "CO2", "GLUCOSE", "BUN", "CREATININE", "CALCIUM", "MG", "PHOS" in the last 168 hours.  GFR: Estimated Creatinine Clearance: 61.6 mL/min (by C-G formula based on SCr of 0.57 mg/dL).  Liver Function Tests: No results for input(s): "AST", "ALT", "ALKPHOS", "BILITOT", "PROT", "ALBUMIN" in the last 168 hours.  CBG: No results for input(s): "GLUCAP" in the last 168 hours.   No results found for this or any previous visit (from the past 240 hour(s)).       Radiology Studies: No results found.      Scheduled Meds:  apixaban  5 mg Oral BID   Chlorhexidine Gluconate Cloth  6 each Topical QHS   feeding supplement  237 mL Oral BID BM   folic acid  1 mg Oral Daily   levothyroxine  100 mcg Oral Q0600   methylPREDNISolone  20 mg Oral Daily   mirtazapine  30 mg Oral QHS   senna  1 tablet Oral Daily   Continuous Infusions:   LOS: 18 days    Time spent: 30 MINS    Ramiro Harvest, MD [facility]   To contact the attending provider between 7A-7P or the covering provider during after hours 7P-7A, please log into the web site [email or web address] and access using universal [place] password for that web site. If you do not have the password, please  call the hospital operator.  03/19/2023, 10:12 PM

## 2023-03-19 NOTE — Progress Notes (Signed)
Physical Therapy Treatment Patient Details Name: KARMESHA FLIPPO MRN: [id number] DOB: Jun 29, 1950 Today's Date: 03/19/2023   History of Present Illness Pt is 72 yo female admitted on 03/05/23 with acute respiratory failure and found to have new bil pulmonary infiltrates.  Pt with bil acute DVT started on Eliquis and subacute L PE.  Pt with hx including but not limited to stage IV adenocarcinoma lung, hepatic and bony metastases that required T7 laminectomy due to spinal cord compression 09/2022    PT Comments  General Comments: AxO x 3 pleasant Lady who was living home alone Indep General bed mobility comments: pt able to self move B LE in and out of bed but requires a rest break between each activity due to dyspnea.General transfer comment: pt able to self rise but requires extra support using B UE's (heavy lean) and able to transfer to near by [facility] but fatigues easily requiring an extended rest break after. General Gait Details: slow progress.  Assisted with amb 32 feet in hallway while monitoring sats.  RA decreased to 88% and dyspnea 2/4 requiring an extended rest break.  Applied 2 lts oxygen to achieve sats >90% during activity. Pt will need ST Rehab at SNF to address mobility and functional decline prior to safely returning home.    If plan is discharge home, recommend the following: A little help with walking and/or transfers;A little help with bathing/dressing/bathroom;Assistance with cooking/housework;Help with stairs or ramp for entrance   Can travel by private vehicle     Yes  Equipment Recommendations  None recommended by PT    Recommendations for Other Services       Precautions / Restrictions Precautions Precautions: Fall Precaution Comments: monitor sats Restrictions Weight Bearing Restrictions: No     Mobility  Bed Mobility Overal bed mobility: Modified Independent Bed Mobility: Supine to Sit, Sit to Supine     Supine to sit: Supervision, Modified independent  (Device/Increase time) Sit to supine: Supervision, Modified independent (Device/Increase time)   General bed mobility comments: pt able to self move B LE in and out of bed but requires a rest break between each activity due to dyspnea.    Transfers Overall transfer level: Needs assistance Equipment used: Rolling walker (2 wheels) Transfers: Sit to/from Stand Sit to Stand: Contact guard assist   Step pivot transfers: Min assist       General transfer comment: pt able to self rise but requires extra support using B UE's (heavy lean) and able to transfer to near by [facility] but fatigues easily requiring an extended rest break after.    Ambulation/Gait Ambulation/Gait assistance: Min assist Gait Distance (Feet): 32 Feet Assistive device: Rolling walker (2 wheels) Gait Pattern/deviations: Step-to pattern, Decreased stride length, Trunk flexed, Ataxic Gait velocity: decreased     General Gait Details: slow progress.  Assisted with amb 32 feet in hallway while monitoring sats.  RA decreased to 88% and dyspnea 2/4 requiring an extended rest break.  Applied 2 lts oxygen to achieve sats >90% during activity.   Stairs             Wheelchair Mobility     Tilt Bed    Modified Rankin (Stroke Patients Only)       Balance                                            Cognition Arousal: Alert  Behavior During Therapy: WFL for tasks assessed/performed Overall Cognitive Status: Within Functional Limits for tasks assessed                                 General Comments: AxO x 3 pleasant Lady who was living home alone Indep        Exercises      General Comments        Pertinent Vitals/Pain Pain Assessment Pain Assessment: No/denies pain    Home Living                          Prior Function            PT Goals (current goals can now be found in the care plan section) Progress towards PT goals: Progressing toward goals     Frequency    Min 1X/week      PT Plan      Co-evaluation              AM-PAC PT "6 Clicks" Mobility   Outcome Measure  Help needed turning from your back to your side while in a flat bed without using bedrails?: None Help needed moving from lying on your back to sitting on the side of a flat bed without using bedrails?: A Little Help needed moving to and from a bed to a chair (including a wheelchair)?: A Little Help needed standing up from a chair using your arms (e.g., wheelchair or bedside chair)?: A Little Help needed to walk in hospital room?: A Lot Help needed climbing 3-5 steps with a railing? : A Lot 6 Click Score: 17    End of Session Equipment Utilized During Treatment: Gait belt Activity Tolerance: Patient limited by fatigue Patient left: in bed;with call bell/phone within reach Nurse Communication: Mobility status PT Visit Diagnosis: Other abnormalities of gait and mobility (R26.89)     Time: 1610-9604 PT Time Calculation (min) (ACUTE ONLY): 16 min  Charges:    $Gait Training: 8-22 mins PT General Charges $$ ACUTE PT VISIT: 1 Visit                     Felecia Shelling  PTA Acute  Rehabilitation Services Office M-F          [phone]

## 2023-03-20 DIAGNOSIS — I10 Essential (primary) hypertension: Secondary | ICD-10-CM | POA: Diagnosis not present

## 2023-03-20 DIAGNOSIS — J9601 Acute respiratory failure with hypoxia: Secondary | ICD-10-CM | POA: Diagnosis not present

## 2023-03-20 DIAGNOSIS — E876 Hypokalemia: Secondary | ICD-10-CM | POA: Diagnosis not present

## 2023-03-20 DIAGNOSIS — C3491 Malignant neoplasm of unspecified part of right bronchus or lung: Secondary | ICD-10-CM | POA: Diagnosis not present

## 2023-03-20 MED ORDER — FUROSEMIDE 10 MG/ML IJ SOLN
20.0000 mg | Freq: Two times a day (BID) | INTRAMUSCULAR | Status: AC
  Administered 2023-03-20 – 2023-03-21 (×2): 20 mg via INTRAVENOUS
  Filled 2023-03-20 (×2): qty 2

## 2023-03-20 NOTE — Progress Notes (Signed)
SATURATION QUALIFICATIONS: (This note is used to comply with regulatory documentation for home oxygen)  Patient Saturations on Room Air at Rest = 92%  Patient Saturations on Room Air while Ambulating = 87-89%   Please briefly explain why patient needs home oxygen: Did not apply oxygen while ambulating because it stayed at 88% except for a brief dip; pt increased back to 90% once seated

## 2023-03-20 NOTE — Progress Notes (Signed)
PROGRESS NOTE    Hayley Wu  AOZ:[id number] DOB: July 09, 1950 DOA: 03/01/2023 PCP: [name], PA-C    Chief Complaint  Patient presents with   Respiratory Distress    Brief Narrative:  59 yof w/ history significant of seasonal allergies, hypertension, tobacco use, hypothyroidism, constipation cord compression due to metastatic disease most severe at the T7 level, hypothyroidism presents to the emergency department due to respiratory distress w/ spo2 50% on RA , lethargy and low-grade fever since early morning 03/01/23. She was admitted for acute respiratory failure with hypoxia sec to bil bronchopneumonia vs pneumonitis from immunotherapy. PCCM consulted and recommendations given.   She was treated with antibiotics and steroids and currently on a tapering dose of steroids.  Therapy evaluations recommending SNF, insurance denied and she appealed.  She is medically stable for discharge.      Assessment & Plan:   Principal Problem:   Acute respiratory failure with hypoxia (HCC) Active Problems:   Hypokalemia   Hypertension   Hypothyroidism   Adenocarcinoma of right lung, stage 4 (HCC)   Constipation   Normocytic anemia   Moderate protein malnutrition (HCC)   Occlusion of left pulmonary artery (HCC)   Cardiomegaly   Pleural effusion due to CHF (congestive heart failure) (HCC)   Acute deep vein thrombosis (DVT) of femoral vein of both lower extremities (HCC)  #1 acute hypoxic respiratory failure secondary to multifocal bilateral bronchopneumonia/pulmonary infiltrates in the setting of stage IV right lung adenocarcinoma -Imaging done noted bony spinal mets, pulmonary nodules, lymphadenopathy with bilateral multifocal opacities. -Differential include opportunistic infection versus pneumonitis either to radiation or immunotherapy versus pneumonia. -Patient noted to have elevated BNP 2D echo with normal LV, right ventricular function patient received some IV Lasix  initially. -Patient seen in consultation by PCCM who recommended high-dose steroid trial with 125 mg of Solu-Medrol x 3 days and then patient was placed on Solu-Medrol 60 mg IV daily since 03/05/2023 per PCCM recommendation who recommended eventual transition to prednisone taper for 3 weeks. -O2 was tried to be weaned off however sats of 85% on ambulation noted and patient appears very deconditioned and requiring short-term SNF. -Patient on exam with some bibasilar crackles and some lower extremity edema. -Lasix 20 mg IV every 12 hours x 2 doses. -Patient seen by PT /OT recommending SNF, TOC consulted for SNF placement. -Continue oxygen, bronchodilators, Eliquis. -Patient's oncologist, Dr. Arlis Porta aware of admission and was in agreement with current management and plan to start on treatment once his symptoms improve next week and may consider removing Keytruda from treatment plan and continue with Alimta due to her episode of pneumonitis.  2.  Anemia -Resolved.  3.  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 -Continue Eliquis.   4.  Hypertension -Stable.  5.  Hypothyroidism -Continue Synthroid.    6.  Hypokalemia -Repleted. -Follow-up.  7.  Constipation -Continue current bowel regimen.  8.  Moderate protein calorie malnutrition -- Continue Remeron nightly.   -Continue regular diet.      DVT prophylaxis: Eliquis Code Status: DNR Family Communication: Updated patient.  No family at bedside. Disposition: SNF  Status is: Inpatient Remains inpatient appropriate because: Severity of illness   Consultants:  PCCM: Dr. Delton Coombes 03/02/2023 Palliative care: Dr. Linna Darner 03/13/2023  Procedures:  CT angiogram chest 03/01/2023 Chest x-ray 03/01/2023, 03/02/2023, 03/06/2023, 03/17/2023 2D echo 03/02/2023 Lower extremity Dopplers  03/02/2023  Antimicrobials:  Anti-infectives (From admission, onward)    Start     Dose/Rate Route Frequency Ordered Stop   03/01/23 1015  azithromycin (ZITHROMAX) 500 mg in sodium chloride 0.9 % 250 mL IVPB  Status:  Discontinued        500 mg 250 mL/hr over 60 Minutes Intravenous Every 24 hours 03/01/23 1009 03/04/23 0856   03/01/23 1015  cefTRIAXone (ROCEPHIN) 1 g in sodium chloride 0.9 % 100 mL IVPB  Status:  Discontinued        1 g 200 mL/hr over 30 Minutes Intravenous Every 24 hours 03/01/23 1009 03/04/23 0856         Subjective: Patient sitting up in recliner.  Denies any chest pain or shortness of breath.  No abdominal pain.  Tolerating oral intake.  Overall feels well.   Objective: Vitals:   03/19/23 0441 03/19/23 2116 03/20/23 0426 03/20/23 1423  BP: 132/73 118/66 (!) 161/82 137/85  Pulse: 70 91 80 97  Resp: 17 19 16 18   Temp: 98.3 F (36.8 C) 98.2 F (36.8 C) 98.1 F (36.7 C) 99.1 F (37.3 C)  TempSrc: Oral Oral Oral Oral  SpO2: 96% 93% 94% 93%  Weight:      Height:        Intake/Output Summary (Last 24 hours) at 03/20/2023 1947 Last data filed at 03/20/2023 1900 Gross per 24 hour  Intake 1080 ml  Output --  Net 1080 ml   Filed Weights   03/01/23 0844 03/01/23 2000 03/11/23 1318  Weight: 67.1 kg 66.8 kg 70.4 kg    Examination:  General exam: Appears calm and comfortable  Respiratory system: Some diffuse crackles/coarse breath sounds.  No wheezing.  Fair air movement.  Speaking in full sentences.   Cardiovascular system: Regular rate rhythm no murmurs rubs or gallops.  No JVD.  1-2+ bilateral lower extremity edema.  Gastrointestinal system: Abdomen is soft, nontender, nondistended, positive bowel sounds.  No rebound.  No guarding.   Central nervous system: Alert and oriented. No focal neurological deficits. Extremities: Symmetric 5 x 5 power. Skin: No rashes, lesions or ulcers Psychiatry: Judgement and insight appear normal. Mood & affect  appropriate.     Data Reviewed: I have personally reviewed following labs and imaging studies  CBC: No results for input(s): "WBC", "NEUTROABS", "HGB", "HCT", "MCV", "PLT" in the last 168 hours.  Basic Metabolic Panel: No results for input(s): "NA", "K", "CL", "CO2", "GLUCOSE", "BUN", "CREATININE", "CALCIUM", "MG", "PHOS" in the last 168 hours.  GFR: Estimated Creatinine Clearance: 61.6 mL/min (by C-G formula based on SCr of 0.57 mg/dL).  Liver Function Tests: No results for input(s): "AST", "ALT", "ALKPHOS", "BILITOT", "PROT", "ALBUMIN" in the last 168 hours.  CBG: No results for input(s): "GLUCAP" in the last 168 hours.   No results found for this or any previous visit (from the past 240 hour(s)).       Radiology Studies: No results found.      Scheduled Meds:  apixaban  5 mg Oral BID   Chlorhexidine Gluconate Cloth  6 each Topical QHS   feeding supplement  237 mL Oral BID BM   folic acid  1 mg Oral Daily   furosemide  20 mg Intravenous Q12H   levothyroxine  100 mcg Oral Q0600   mirtazapine  30 mg Oral QHS   senna  1 tablet Oral Daily   Continuous Infusions:   LOS: 19 days    Time spent: 68 MINS    Ramiro Harvest, MD [facility]   To  contact the attending provider between 7A-7P or the covering provider during after hours 7P-7A, please log into the web site [email or web address] and access using universal  password for that web site. If you do not have the password, please call the hospital operator.  03/20/2023, 7:47 PM

## 2023-03-20 NOTE — Plan of Care (Signed)
  Problem: Coping: Goal: Level of anxiety will decrease Outcome: Adequate for Discharge   Problem: Elimination: Goal: Will not experience complications related to bowel motility Outcome: Adequate for Discharge   Problem: Clinical Measurements: Goal: Will remain free from infection Outcome: Completed/Met Goal: Diagnostic test results will improve Outcome: Completed/Met Goal: Cardiovascular complication will be avoided Outcome: Completed/Met   Problem: Nutrition: Goal: Adequate nutrition will be maintained Outcome: Completed/Met   Problem: Elimination: Goal: Will not experience complications related to urinary retention Outcome: Completed/Met   Problem: Skin Integrity: Goal: Risk for impaired skin integrity will decrease Outcome: Completed/Met

## 2023-03-21 DIAGNOSIS — I10 Essential (primary) hypertension: Secondary | ICD-10-CM | POA: Diagnosis not present

## 2023-03-21 DIAGNOSIS — E876 Hypokalemia: Secondary | ICD-10-CM | POA: Diagnosis not present

## 2023-03-21 DIAGNOSIS — C3491 Malignant neoplasm of unspecified part of right bronchus or lung: Secondary | ICD-10-CM | POA: Diagnosis not present

## 2023-03-21 DIAGNOSIS — J9601 Acute respiratory failure with hypoxia: Secondary | ICD-10-CM | POA: Diagnosis not present

## 2023-03-21 LAB — BASIC METABOLIC PANEL
Anion gap: 11 (ref 5–15)
BUN: 22 mg/dL (ref 8–23)
CO2: 28 mmol/L (ref 22–32)
Calcium: 8.4 mg/dL — ABNORMAL LOW (ref 8.9–10.3)
Chloride: 100 mmol/L (ref 98–111)
Creatinine, Ser: 0.45 mg/dL (ref 0.44–1.00)
GFR, Estimated: >60 mL/min (ref >60)
Glucose, Bld: 97 mg/dL (ref 70–99)
Potassium: 3.8 mmol/L (ref 3.5–5.1)
Sodium: 139 mmol/L (ref 135–145)

## 2023-03-21 LAB — CBC
HCT: 35.5 % — ABNORMAL LOW (ref 36.0–46.0)
Hemoglobin: 11 g/dL — ABNORMAL LOW (ref 12.0–15.0)
MCH: 30.5 pg (ref 26.0–34.0)
MCHC: 31 g/dL (ref 30.0–36.0)
MCV: 98.3 fL (ref 80.0–100.0)
Platelets: 110 10*3/uL — ABNORMAL LOW (ref 150–400)
RBC: 3.61 MIL/uL — ABNORMAL LOW (ref 3.87–5.11)
RDW: 15.6 % — ABNORMAL HIGH (ref 11.5–15.5)
WBC: 6.3 10*3/uL (ref 4.0–10.5)
nRBC: 0 % (ref 0.0–0.2)

## 2023-03-21 LAB — MAGNESIUM: Magnesium: 2.1 mg/dL (ref 1.7–2.4)

## 2023-03-21 MED ORDER — FUROSEMIDE 10 MG/ML IJ SOLN
40.0000 mg | Freq: Every day | INTRAMUSCULAR | Status: DC
  Filled 2023-03-21: qty 4

## 2023-03-21 MED FILL — Dexamethasone Sodium Phosphate Inj 100 MG/10ML: INTRAMUSCULAR | Qty: 1 | Status: AC

## 2023-03-21 NOTE — TOC Progression Note (Signed)
Transition of Care [facility]) - Progression Note    Patient Details  Name: Hayley Wu MRN: [id number] Date of Birth: 13-Aug-1950  Transition of [facility] CM/SW Contact  Howell Rucks, RN Phone Number: 03/21/2023, 12:41 PM  Clinical Narrative:   Call to HTA Appeal and Grievances to check on status of second level appeal, vm left with NCM name and phone number requesting call back.,           Expected Discharge Plan and Services                                               Social Determinants of Health (SDOH) Interventions SDOH Screenings   Food Insecurity: No Food Insecurity (03/01/2023)  Housing: Low Risk  (03/01/2023)  Transportation Needs: No Transportation Needs (03/01/2023)  Utilities: Not At Risk (03/01/2023)  Depression (PHQ2-9): Low Risk  (10/02/2022)  Tobacco Use: High Risk (03/01/2023)    Readmission Risk Interventions    03/04/2023    6:23 PM  Readmission Risk Prevention Plan  Transportation Screening Complete  PCP or Specialist Appt within 3-5 Days Complete  HRI or Home Care Consult Complete  Social Work Consult for Recovery Care Planning/Counseling Complete  Palliative Care Screening Not Applicable  Medication Review [employer]) Complete

## 2023-03-21 NOTE — Progress Notes (Signed)
PROGRESS NOTE    Hayley Wu  AYT:[id number] DOB: 02/06/1951 DOA: 03/01/2023 PCP: [name], PA-C    Chief Complaint  Patient presents with   Respiratory Distress    Brief Narrative:  16 yof w/ history significant of seasonal allergies, hypertension, tobacco use, hypothyroidism, constipation cord compression due to metastatic disease most severe at the T7 level, hypothyroidism presents to the emergency department due to respiratory distress w/ spo2 50% on RA , lethargy and low-grade fever since early morning 03/01/23. She was admitted for acute respiratory failure with hypoxia sec to bil bronchopneumonia vs pneumonitis from immunotherapy. PCCM consulted and recommendations given.   She was treated with antibiotics and steroids and currently on a tapering dose of steroids.  Therapy evaluations recommending SNF, insurance denied and she appealed.  She is medically stable for discharge.      Assessment & Plan:   Principal Problem:   Acute respiratory failure with hypoxia (HCC) Active Problems:   Hypokalemia   Hypertension   Hypothyroidism   Adenocarcinoma of right lung, stage 4 (HCC)   Constipation   Normocytic anemia   Moderate protein malnutrition (HCC)   Occlusion of left pulmonary artery (HCC)   Cardiomegaly   Pleural effusion due to CHF (congestive heart failure) (HCC)   Acute deep vein thrombosis (DVT) of femoral vein of both lower extremities (HCC)  #1 acute hypoxic respiratory failure secondary to multifocal bilateral bronchopneumonia/pulmonary infiltrates in the setting of stage IV right lung adenocarcinoma -Imaging done noted bony spinal mets, pulmonary nodules, lymphadenopathy with bilateral multifocal opacities. -Differential include opportunistic infection versus pneumonitis either to radiation or immunotherapy versus pneumonia. -Patient noted to have elevated BNP 2D echo with normal LV, right ventricular function patient received some IV Lasix  initially. -Patient seen in consultation by PCCM who recommended high-dose steroid trial with 125 mg of Solu-Medrol x 3 days and then patient was placed on Solu-Medrol 60 mg IV daily since 03/05/2023 per PCCM recommendation who recommended eventual transition to prednisone taper for 3 weeks. -O2 was tried to be weaned off however sats of 85% on ambulation noted and patient appears very deconditioned and requiring short-term SNF. -Patient on exam with some bibasilar crackles and some lower extremity edema. -Lasix 20 mg IV every 12 hours x 2 doses was done on 03/20/2023 with urine output of 1.2 L over the past 24 hours. -Will reassess volume status in the morning to determine if to give any further IV diuretics.. -Patient seen by PT /OT recommending SNF, TOC consulted for SNF placement. -Continue oxygen, bronchodilators, Eliquis. -Patient's oncologist, Dr. Arlis Porta aware of admission and was in agreement with current management and plan to start on treatment once his symptoms improve next week and may consider removing Keytruda from treatment plan and continue with Alimta due to her episode of pneumonitis.  2.  Anemia -Resolved. -Hemoglobin stable at 11.0.  3.  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 -Continue Eliquis.   4.  Hypertension -Stable.  5.  Hypothyroidism -Synthroid.     6.  Hypokalemia -Repleted. -Potassium at 3.8. -Follow.  7.  Constipation -Continue Senokot-S daily.    8.  Moderate protein calorie malnutrition -- Remeron nightly.   -Diet liberalized to regular diet.     DVT prophylaxis: Eliquis Code Status: DNR Family Communication: Updated patient.  No family at bedside. Disposition: SNF  Status is: Inpatient Remains inpatient appropriate because:  Severity of illness   Consultants:  PCCM: Dr. Delton Coombes  03/02/2023 Palliative care: Dr. Linna Darner 03/13/2023  Procedures:  CT angiogram chest 03/01/2023 Chest x-ray 03/01/2023, 03/02/2023, 03/06/2023, 03/17/2023 2D echo 03/02/2023 Lower extremity Dopplers 03/02/2023  Antimicrobials:  Anti-infectives (From admission, onward)    Start     Dose/Rate Route Frequency Ordered Stop   03/01/23 1015  azithromycin (ZITHROMAX) 500 mg in sodium chloride 0.9 % 250 mL IVPB  Status:  Discontinued        500 mg 250 mL/hr over 60 Minutes Intravenous Every 24 hours 03/01/23 1009 03/04/23 0856   03/01/23 1015  cefTRIAXone (ROCEPHIN) 1 g in sodium chloride 0.9 % 100 mL IVPB  Status:  Discontinued        1 g 200 mL/hr over 30 Minutes Intravenous Every 24 hours 03/01/23 1009 03/04/23 0856         Subjective: Laying in bed.  States had significant urine output with diuretics yesterday.  Denies any significant chest pain or shortness of breath.  Tolerating oral intake.    Objective: Vitals:   03/20/23 2010 03/21/23 0357 03/21/23 1230 03/21/23 1737  BP: 138/86 (!) 147/81 137/79 (!) 154/84  Pulse: 95 76 81 88  Resp: 16 16 18 18   Temp: 98.1 F (36.7 C) 97.9 F (36.6 C) 97.6 F (36.4 C) 97.6 F (36.4 C)  TempSrc: Oral Oral Oral Oral  SpO2: 94% 91% 98% 93%  Weight:      Height:        Intake/Output Summary (Last 24 hours) at 03/21/2023 1738 Last data filed at 03/21/2023 1558 Gross per 24 hour  Intake 600 ml  Output 2800 ml  Net -2200 ml   Filed Weights   03/01/23 0844 03/01/23 2000 03/11/23 1318  Weight: 67.1 kg 66.8 kg 70.4 kg    Examination:  General exam: NAD. Respiratory system: Decreased diffuse crackles/coarse breath sounds.  No wheezing.  Fair air movement.  Speaking in full sentences.  Cardiovascular system: RRR no murmurs rubs or gallops.  No JVD.  1+ bilateral lower extremity edema.  Gastrointestinal system: Abdomen is soft, nontender, nondistended, positive bowel sounds.  No rebound.  No guarding.    Central nervous system: Alert and  oriented. No focal neurological deficits. Extremities: Symmetric 5 x 5 power. Skin: No rashes, lesions or ulcers Psychiatry: Judgement and insight appear normal. Mood & affect appropriate.     Data Reviewed: I have personally reviewed following labs and imaging studies  CBC: Recent Labs  Lab 03/21/23 0316  WBC 6.3  HGB 11.0*  HCT 35.5*  MCV 98.3  PLT 110*    Basic Metabolic Panel: Recent Labs  Lab 03/21/23 0316  NA 139  K 3.8  CL 100  CO2 28  GLUCOSE 97  BUN 22  CREATININE 0.45  CALCIUM 8.4*  MG 2.1    GFR: Estimated Creatinine Clearance: 61.6 mL/min (by C-G formula based on SCr of 0.45 mg/dL).  Liver Function Tests: No results for input(s): "AST", "ALT", "ALKPHOS", "BILITOT", "PROT", "ALBUMIN" in the last 168 hours.  CBG: No results for input(s): "GLUCAP" in the last 168 hours.   No results found for this or any previous visit (from the past 240 hour(s)).       Radiology Studies: No results found.      Scheduled Meds:  apixaban  5 mg Oral BID   Chlorhexidine Gluconate Cloth  6 each Topical QHS   feeding supplement  237 mL Oral BID BM   folic acid  1 mg  Oral Daily   levothyroxine  100 mcg Oral Q0600   mirtazapine  30 mg Oral QHS   senna  1 tablet Oral Daily   Continuous Infusions:   LOS: 20 days    Time spent: 36 MINS    Ramiro Harvest, MD [facility]   To contact the attending provider between 7A-7P or the covering provider during after hours 7P-7A, please log into the web site [email or web address] and access using universal [place] password for that web site. If you do not have the password, please call the hospital operator.  03/21/2023, 5:38 PM

## 2023-03-21 NOTE — Plan of Care (Signed)
  Problem: Health Behavior/Discharge Planning: Goal: Ability to manage health-related needs will improve Outcome: Progressing   Problem: Clinical Measurements: Goal: Respiratory complications will improve Outcome: Progressing   Problem: Activity: Goal: Risk for activity intolerance will decrease Outcome: Progressing   

## 2023-03-21 NOTE — Progress Notes (Signed)
Physical Therapy Treatment Patient Details Name: Hayley Wu MRN: [id number] DOB: 1951-02-01 Today's Date: 03/21/2023   History of Present Illness Pt is 72 yo female admitted on 03/05/23 with acute respiratory failure and found to have new bil pulmonary infiltrates.  Pt with bil acute DVT started on Eliquis and subacute L PE.  Pt with hx including but not limited to stage IV adenocarcinoma lung, hepatic and bony metastases that required T7 laminectomy due to spinal cord compression 09/2022    PT Comments  Pt is progressing with activity tolerance, she ambulated 35' x 2 with rest break between trials. SpO2 94% on room air at rest, 87-88% on room air walking (85-86% on room air while walking and talking, so encouraged no talking), with verbal cues for pursed lip breathing. Distance limited by 2/4 dyspnea.     If plan is discharge home, recommend the following: A little help with walking and/or transfers;A little help with bathing/dressing/bathroom;Assistance with cooking/housework;Help with stairs or ramp for entrance   Can travel by private vehicle     Yes  Equipment Recommendations  None recommended by PT    Recommendations for Other Services       Precautions / Restrictions Precautions Precautions: Fall Precaution Comments: monitor sats Restrictions Weight Bearing Restrictions: No     Mobility  Bed Mobility Overal bed mobility: Modified Independent Bed Mobility: Supine to Sit     Supine to sit: Modified independent (Device/Increase time), HOB elevated, Used rails Sit to supine: Modified independent (Device/Increase time), HOB elevated, Used rails        Transfers Overall transfer level: Modified independent Equipment used: Rolling walker (2 wheels) Transfers: Sit to/from Stand Sit to Stand: Modified independent (Device/Increase time)           General transfer comment: Mod I with RW    Ambulation/Gait Ambulation/Gait assistance: Supervision Gait Distance  (Feet): 35 Feet Assistive device: Rolling walker (2 wheels) Gait Pattern/deviations: Step-to pattern, Decreased stride length, Trunk flexed, Ataxic Gait velocity: decreased     General Gait Details: 45' x 2 with standing rest break, SpO2 87% on room air walking, 94% on room air at rest; 2/4 dyspnea   Stairs             Wheelchair Mobility     Tilt Bed    Modified Rankin (Stroke Patients Only)       Balance Overall balance assessment: Needs assistance Sitting-balance support: No upper extremity supported Sitting balance-Leahy Scale: Good     Standing balance support: Bilateral upper extremity supported, Reliant on assistive device for balance, During functional activity Standing balance-Leahy Scale: Poor Standing balance comment: RW for ambulation                            Cognition Arousal: Alert Behavior During Therapy: WFL for tasks assessed/performed Overall Cognitive Status: Within Functional Limits for tasks assessed                                 General Comments: AxO x 4 pleasant Lady who was living home alone Indep        Exercises      General Comments        Pertinent Vitals/Pain Pain Assessment Pain Assessment: No/denies pain    Home Living  Prior Function            PT Goals (current goals can now be found in the care plan section) Acute Rehab PT Goals Patient Stated Goal: return home but open to rehab if needed due to limited support PT Goal Formulation: With patient Time For Goal Achievement: 04/04/23 Potential to Achieve Goals: Good Progress towards PT goals: Progressing toward goals    Frequency    Min 1X/week      PT Plan      Co-evaluation              AM-PAC PT "6 Clicks" Mobility   Outcome Measure  Help needed turning from your back to your side while in a flat bed without using bedrails?: None Help needed moving from lying on your back to  sitting on the side of a flat bed without using bedrails?: A Little Help needed moving to and from a bed to a chair (including a wheelchair)?: A Little Help needed standing up from a chair using your arms (e.g., wheelchair or bedside chair)?: None Help needed to walk in hospital room?: A Little Help needed climbing 3-5 steps with a railing? : A Lot 6 Click Score: 19    End of Session   Activity Tolerance: Patient tolerated treatment well;Patient limited by fatigue Patient left: in bed;with call bell/phone within reach Nurse Communication: Mobility status PT Visit Diagnosis: Other abnormalities of gait and mobility (R26.89);Difficulty in walking, not elsewhere classified (R26.2)     Time: 1042-1100 PT Time Calculation (min) (ACUTE ONLY): 18 min  Charges:    $Gait Training: 8-22 mins PT General Charges $$ ACUTE PT VISIT: 1 Visit                    Tamala Ser PT 03/21/2023  Acute Rehabilitation Services  Office [phone]

## 2023-03-22 DIAGNOSIS — I509 Heart failure, unspecified: Secondary | ICD-10-CM | POA: Diagnosis not present

## 2023-03-22 DIAGNOSIS — E876 Hypokalemia: Secondary | ICD-10-CM | POA: Diagnosis not present

## 2023-03-22 DIAGNOSIS — C3491 Malignant neoplasm of unspecified part of right bronchus or lung: Secondary | ICD-10-CM | POA: Diagnosis not present

## 2023-03-22 DIAGNOSIS — J9601 Acute respiratory failure with hypoxia: Secondary | ICD-10-CM | POA: Diagnosis not present

## 2023-03-22 LAB — BASIC METABOLIC PANEL
Anion gap: 9 (ref 5–15)
BUN: 18 mg/dL (ref 8–23)
CO2: 29 mmol/L (ref 22–32)
Calcium: 7.9 mg/dL — ABNORMAL LOW (ref 8.9–10.3)
Chloride: 101 mmol/L (ref 98–111)
Creatinine, Ser: 0.52 mg/dL (ref 0.44–1.00)
GFR, Estimated: >60 mL/min (ref >60)
Glucose, Bld: 89 mg/dL (ref 70–99)
Potassium: 3.4 mmol/L — ABNORMAL LOW (ref 3.5–5.1)
Sodium: 139 mmol/L (ref 135–145)

## 2023-03-22 MED ORDER — FUROSEMIDE 10 MG/ML IJ SOLN
40.0000 mg | Freq: Once | INTRAMUSCULAR | Status: AC
  Administered 2023-03-22: 40 mg via INTRAVENOUS
  Filled 2023-03-22: qty 4

## 2023-03-22 MED ORDER — SENNA 8.6 MG PO TABS: 1.0000 | ORAL_TABLET | Freq: Every day | ORAL | Status: DC | PRN

## 2023-03-22 MED ORDER — POTASSIUM CHLORIDE CRYS ER 20 MEQ PO TBCR
40.0000 meq | EXTENDED_RELEASE_TABLET | Freq: Once | ORAL | Status: AC
  Administered 2023-03-22: 40 meq via ORAL
  Filled 2023-03-22: qty 2

## 2023-03-22 MED ORDER — METHYLPREDNISOLONE 4 MG PO TABS
10.0000 mg | ORAL_TABLET | Freq: Every day | ORAL | Status: AC
  Administered 2023-03-22 – 2023-03-25 (×4): 10 mg via ORAL
  Filled 2023-03-22 (×5): qty 3

## 2023-03-22 NOTE — Plan of Care (Signed)
  Problem: Health Behavior/Discharge Planning: Goal: Ability to manage health-related needs will improve Outcome: Progressing   Problem: Clinical Measurements: Goal: Ability to maintain clinical measurements within normal limits will improve Outcome: Progressing Goal: Respiratory complications will improve Outcome: Progressing   Problem: Activity: Goal: Risk for activity intolerance will decrease Outcome: Progressing   Problem: Pain Managment: Goal: General experience of comfort will improve Outcome: Progressing   Problem: Safety: Goal: Ability to remain free from injury will improve Outcome: Progressing   Problem: Activity: Goal: Ability to tolerate increased activity will improve Outcome: Progressing   Problem: Clinical Measurements: Goal: Ability to maintain a body temperature in the normal range will improve Outcome: Progressing   Problem: Respiratory: Goal: Ability to maintain adequate ventilation will improve Outcome: Progressing Goal: Ability to maintain a clear airway will improve Outcome: Progressing

## 2023-03-22 NOTE — Progress Notes (Signed)
Mobility Specialist - Progress Note   03/22/23 0936  Mobility  Activity Ambulated with assistance in hallway  Level of Assistance Contact guard assist, steadying assist  Assistive Device Front wheel walker  Distance Ambulated (ft) 90 ft  Range of Motion/Exercises Active  Activity Response Tolerated well  Mobility Referral Yes  $Mobility charge 1 Mobility  Mobility Specialist Start Time (ACUTE ONLY) 0915  Mobility Specialist Stop Time (ACUTE ONLY) 0930  Mobility Specialist Time Calculation (min) (ACUTE ONLY) 15 min   Received in bed and agreed to mobility, on .5L pt desat to 88%. Had no issues. Returned to recliner with all needs met.  Marilynne Halsted Mobility Specialist

## 2023-03-22 NOTE — Progress Notes (Signed)
PROGRESS NOTE    Hayley Wu  WUJ:[id number] DOB: 26-Jul-1950 DOA: 03/01/2023 PCP: [name], PA-C    Chief Complaint  Patient presents with   Respiratory Distress    Brief Narrative:  5 yof w/ history significant of seasonal allergies, hypertension, tobacco use, hypothyroidism, constipation cord compression due to metastatic disease most severe at the T7 level, hypothyroidism presents to the emergency department due to respiratory distress w/ spo2 50% on RA , lethargy and low-grade fever since early morning 03/01/23. She was admitted for acute respiratory failure with hypoxia sec to bil bronchopneumonia vs pneumonitis from immunotherapy. PCCM consulted and recommendations given.   She was treated with antibiotics and steroids and currently on a tapering dose of steroids.  Therapy evaluations recommending SNF, insurance denied and she appealed.  She is medically stable for discharge.      Assessment & Plan:   Principal Problem:   Acute respiratory failure with hypoxia (HCC) Active Problems:   Hypokalemia   Hypertension   Hypothyroidism   Adenocarcinoma of right lung, stage 4 (HCC)   Constipation   Normocytic anemia   Moderate protein malnutrition (HCC)   Occlusion of left pulmonary artery (HCC)   Cardiomegaly   Pleural effusion due to CHF (congestive heart failure) (HCC)   Acute deep vein thrombosis (DVT) of femoral vein of both lower extremities (HCC)  #1 acute hypoxic respiratory failure secondary to multifocal bilateral bronchopneumonia/pulmonary infiltrates in the setting of stage IV right lung adenocarcinoma -Imaging done noted bony spinal mets, pulmonary nodules, lymphadenopathy with bilateral multifocal opacities. -Differential include opportunistic infection versus pneumonitis either to radiation or immunotherapy versus pneumonia. -Patient noted to have elevated BNP 2D echo with normal LV, right ventricular function patient received some IV Lasix  initially. -Patient seen in consultation by PCCM who recommended high-dose steroid trial with 125 mg of Solu-Medrol x 3 days and then patient was placed on Solu-Medrol 60 mg IV daily since 03/05/2023 per PCCM recommendation who recommended eventual transition to prednisone taper for 3 weeks. -O2 was tried to be weaned off however sats of 85% on ambulation noted and patient appears very deconditioned and requiring short-term SNF. -Patient on exam with some bibasilar crackles and some lower extremity edema. -Lasix 20 mg IV every 12 hours x 2 doses was done on 03/20/2023 with urine output of 1.2 L over the past 24 hours. -Patient still with volume overload on examination with some bibasilar crackles and lower extremity edema. -Lasix 40 mg IV x 1. -Patient seen by PT /OT recommending SNF, TOC consulted for SNF placement. -Continue oxygen, bronchodilators, Eliquis. -Patient's oncologist, Dr. Arlis Porta aware of admission and was in agreement with current management and plan to start on treatment once his symptoms improve next week and may consider removing Keytruda from treatment plan and continue with Alimta due to her episode of pneumonitis.  2.  Anemia -Resolved. -Hemoglobin stable at 11.0.    3.  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 -Eliquis.  4.  Hypertension -Stable.  5.  Hypothyroidism -Synthroid.     6.  Hypokalemia -Potassium at 3.4.   -Secondary to IV Lasix.   -Replete.   7.  Constipation -Patient on Senokot as daily however noted to be refusing.   -Change Senokot-S to daily as needed.   8.  Moderate protein calorie malnutrition -- Remeron nightly.   -Diet liberalized to regular diet.  DVT prophylaxis: Eliquis Code Status: DNR Family Communication: Updated patient.  No family at bedside. Disposition:  SNF  Status is: Inpatient Remains inpatient appropriate because: Severity of illness   Consultants:  PCCM: Dr. Delton Coombes 03/02/2023 Palliative care: Dr. Linna Darner 03/13/2023  Procedures:  CT angiogram chest 03/01/2023 Chest x-ray 03/01/2023, 03/02/2023, 03/06/2023, 03/17/2023 2D echo 03/02/2023 Lower extremity Dopplers 03/02/2023  Antimicrobials:  Anti-infectives (From admission, onward)    Start     Dose/Rate Route Frequency Ordered Stop   03/01/23 1015  azithromycin (ZITHROMAX) 500 mg in sodium chloride 0.9 % 250 mL IVPB  Status:  Discontinued        500 mg 250 mL/hr over 60 Minutes Intravenous Every 24 hours 03/01/23 1009 03/04/23 0856   03/01/23 1015  cefTRIAXone (ROCEPHIN) 1 g in sodium chloride 0.9 % 100 mL IVPB  Status:  Discontinued        1 g 200 mL/hr over 30 Minutes Intravenous Every 24 hours 03/01/23 1009 03/04/23 0856         Subjective: Patient sitting up in recliner.  Denies any chest pain or significant shortness of breath.  No abdominal pain.  States she ambulated in the hallway this morning.   Objective: Vitals:   03/21/23 2053 03/22/23 0122 03/22/23 0520 03/22/23 0833  BP: 124/75 129/71 (!) 146/87 (!) 140/89  Pulse: 95 92 83 94  Resp: 15 15 14 20   Temp: 98 F (36.7 C) 98.1 F (36.7 C) 98.3 F (36.8 C) 98.5 F (36.9 C)  TempSrc: Oral Oral Oral Oral  SpO2: 93% 93% 91% 94%  Weight:      Height:        Intake/Output Summary (Last 24 hours) at 03/22/2023 1025 Last data filed at 03/22/2023 0833 Gross per 24 hour  Intake 390 ml  Output 1600 ml  Net -1210 ml   Filed Weights   03/01/23 0844 03/01/23 2000 03/11/23 1318  Weight: 67.1 kg 66.8 kg 70.4 kg    Examination:  General exam: NAD. Respiratory system: Bibasilar crackles/coarse breath sounds.  No wheezing.  Fair air movement.  Speaking in full sentences.  Cardiovascular system: Regular rate rhythm no murmurs rubs or gallops.  No JVD.  1+ bilateral lower extremity edema.  Gastrointestinal system: Abdomen  is soft, nontender, nondistended, positive bowel sounds.  No rebound.  No guarding.  Central nervous system: Alert and oriented. No focal neurological deficits. Extremities: Symmetric 5 x 5 power. Skin: No rashes, lesions or ulcers Psychiatry: Judgement and insight appear normal. Mood & affect appropriate.     Data Reviewed: I have personally reviewed following labs and imaging studies  CBC: Recent Labs  Lab 03/21/23 0316  WBC 6.3  HGB 11.0*  HCT 35.5*  MCV 98.3  PLT 110*    Basic Metabolic Panel: Recent Labs  Lab 03/21/23 0316 03/22/23 0328  NA 139 139  K 3.8 3.4*  CL 100 101  CO2 28 29  GLUCOSE 97 89  BUN 22 18  CREATININE 0.45 0.52  CALCIUM 8.4* 7.9*  MG 2.1  --     GFR: Estimated Creatinine Clearance: 61.6 mL/min (by C-G formula based on SCr of 0.52 mg/dL).  Liver Function Tests: No results for input(s): "AST", "ALT", "ALKPHOS", "BILITOT", "PROT", "ALBUMIN" in the last 168 hours.  CBG: No results for input(s): "GLUCAP" in the last 168 hours.   No results found for this or any previous visit (from the past 240 hour(s)).       Radiology Studies: No results found.  Scheduled Meds:  apixaban  5 mg Oral BID   Chlorhexidine Gluconate Cloth  6 each Topical QHS   feeding supplement  237 mL Oral BID BM   folic acid  1 mg Oral Daily   levothyroxine  100 mcg Oral Q0600   mirtazapine  30 mg Oral QHS   senna  1 tablet Oral Daily   Continuous Infusions:   LOS: 21 days    Time spent: 34 MINS    Ramiro Harvest, MD [facility]   To contact the attending provider between 7A-7P or the covering provider during after hours 7P-7A, please log into the web site [email or web address] and access using universal [place] password for that web site. If you do not have the password, please call the hospital operator.  03/22/2023, 10:25 AM

## 2023-03-23 DIAGNOSIS — C3491 Malignant neoplasm of unspecified part of right bronchus or lung: Secondary | ICD-10-CM | POA: Diagnosis not present

## 2023-03-23 DIAGNOSIS — E876 Hypokalemia: Secondary | ICD-10-CM | POA: Diagnosis not present

## 2023-03-23 DIAGNOSIS — J9601 Acute respiratory failure with hypoxia: Secondary | ICD-10-CM | POA: Diagnosis not present

## 2023-03-23 DIAGNOSIS — I10 Essential (primary) hypertension: Secondary | ICD-10-CM | POA: Diagnosis not present

## 2023-03-23 LAB — BASIC METABOLIC PANEL
Anion gap: 11 (ref 5–15)
BUN: 20 mg/dL (ref 8–23)
CO2: 28 mmol/L (ref 22–32)
Calcium: 8.4 mg/dL — ABNORMAL LOW (ref 8.9–10.3)
Chloride: 101 mmol/L (ref 98–111)
Creatinine, Ser: 0.49 mg/dL (ref 0.44–1.00)
GFR, Estimated: >60 mL/min (ref >60)
Glucose, Bld: 133 mg/dL — ABNORMAL HIGH (ref 70–99)
Potassium: 3.8 mmol/L (ref 3.5–5.1)
Sodium: 140 mmol/L (ref 135–145)

## 2023-03-23 NOTE — Progress Notes (Signed)
PROGRESS NOTE    Hayley Wu  QMV:[id number] DOB: Sep 17, 1950 DOA: 03/01/2023 PCP: [name], PA-C    Chief Complaint  Patient presents with   Respiratory Distress    Brief Narrative:  34 yof w/ history significant of seasonal allergies, hypertension, tobacco use, hypothyroidism, constipation cord compression due to metastatic disease most severe at the T7 level, hypothyroidism presents to the emergency department due to respiratory distress w/ spo2 50% on RA , lethargy and low-grade fever since early morning 03/01/23. She was admitted for acute respiratory failure with hypoxia sec to bil bronchopneumonia vs pneumonitis from immunotherapy. PCCM consulted and recommendations given.   She was treated with antibiotics and steroids and currently on a tapering dose of steroids.  Therapy evaluations recommending SNF, insurance denied and she appealed.  She is medically stable for discharge.      Assessment & Plan:   Principal Problem:   Acute respiratory failure with hypoxia (HCC) Active Problems:   Hypokalemia   Hypertension   Hypothyroidism   Adenocarcinoma of right lung, stage 4 (HCC)   Constipation   Normocytic anemia   Moderate protein malnutrition (HCC)   Occlusion of left pulmonary artery (HCC)   Cardiomegaly   Pleural effusion due to CHF (congestive heart failure) (HCC)   Acute deep vein thrombosis (DVT) of femoral vein of both lower extremities (HCC)  #1 acute hypoxic respiratory failure secondary to multifocal bilateral bronchopneumonia/pulmonary infiltrates in the setting of stage IV right lung adenocarcinoma -Imaging done noted bony spinal mets, pulmonary nodules, lymphadenopathy with bilateral multifocal opacities. -Differential include opportunistic infection versus pneumonitis either to radiation or immunotherapy versus pneumonia. -Patient noted to have elevated BNP 2D echo with normal LV, right ventricular function patient received some IV Lasix  initially. -Patient seen in consultation by PCCM who recommended high-dose steroid trial with 125 mg of Solu-Medrol x 3 days and then patient was placed on Solu-Medrol 60 mg IV daily since 03/05/2023 per PCCM recommendation who recommended eventual transition to prednisone taper for 3 weeks. -O2 was tried to be weaned off however sats of 85% on ambulation noted early on in the hospitalization and patient appears very deconditioned and requiring short-term SNF. -Patient on exam with some bibasilar crackles and some lower extremity edema. -Lasix 20 mg IV every 12 hours x 2 doses was done on 03/20/2023, patient subsequently received Lasix 40 mg IV daily times the past 2 days, urine output of 300 cc recorded however not accurate.   -Volume status improving still with some bibasilar crackles and improving lower extremity edema.   -Labs pending for today.   -May consider Lasix 40 mg IV x 1 again today pending lab work.  -Patient seen by PT /OT recommending SNF, TOC consulted for SNF placement. -Continue oxygen, bronchodilators, steroid taper, Eliquis. -Patient's oncologist, Dr. Arlis Porta aware of admission and was in agreement with current management and plan to start on treatment once his symptoms improve next week and may consider removing Keytruda from treatment plan and continue with Alimta due to her episode of pneumonitis.  2.  Anemia -Resolved. -Hemoglobin stable at 11.0.    3.  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 -Eliquis.  4.  Hypertension -Stable.  5.  Hypothyroidism -Continue Synthroid.     6.  Hypokalemia -Labs pending for today.   -Replete as needed.   7.  Constipation -Patient on Senokot as daily however  noted to be refusing.   -Senokot-S changed to as needed.    8.  Moderate protein calorie malnutrition -- Remeron  nightly.   -Diet liberalized to regular diet.     DVT prophylaxis: Eliquis Code Status: DNR Family Communication: Updated patient.  No family at bedside. Disposition: SNF  Status is: Inpatient Remains inpatient appropriate because: Severity of illness   Consultants:  PCCM: Dr. Delton Coombes 03/02/2023 Palliative care: Dr. Linna Darner 03/13/2023  Procedures:  CT angiogram chest 03/01/2023 Chest x-ray 03/01/2023, 03/02/2023, 03/06/2023, 03/17/2023 2D echo 03/02/2023 Lower extremity Dopplers 03/02/2023  Antimicrobials:  Anti-infectives (From admission, onward)    Start     Dose/Rate Route Frequency Ordered Stop   03/01/23 1015  azithromycin (ZITHROMAX) 500 mg in sodium chloride 0.9 % 250 mL IVPB  Status:  Discontinued        500 mg 250 mL/hr over 60 Minutes Intravenous Every 24 hours 03/01/23 1009 03/04/23 0856   03/01/23 1015  cefTRIAXone (ROCEPHIN) 1 g in sodium chloride 0.9 % 100 mL IVPB  Status:  Discontinued        1 g 200 mL/hr over 30 Minutes Intravenous Every 24 hours 03/01/23 1009 03/04/23 0856         Subjective: Sitting up in bed with pulse ox on her finger checking her sats on room air.  Patient denies any chest pain, no shortness of breath, no abdominal pain.  Stated ambulated in hallway.   Objective: Vitals:   03/22/23 0833 03/22/23 1116 03/22/23 2056 03/23/23 0517  BP: (!) 140/89 (!) 149/86 (!) 140/93 136/83  Pulse: 94 100 95 77  Resp: 20 18 16 19   Temp: 98.5 F (36.9 C)  98.3 F (36.8 C) 98.3 F (36.8 C)  TempSrc: Oral  Oral Oral  SpO2: 94% 95% 93% 93%  Weight:      Height:        Intake/Output Summary (Last 24 hours) at 03/23/2023 0956 Last data filed at 03/23/2023 0536 Gross per 24 hour  Intake 240 ml  Output 300 ml  Net -60 ml   Filed Weights   03/01/23 0844 03/01/23 2000 03/11/23 1318  Weight: 67.1 kg 66.8 kg 70.4 kg    Examination:  General exam: NAD. Respiratory system: Bibasilar crackles.  No wheezing.  Fair air movement.  Speaking in full  sentences.   Cardiovascular system: RRR no murmurs rubs or gallops.  No JVD.  1+ bilateral lower extremity edema. Gastrointestinal system: Abdomen is soft, nontender, nondistended, positive bowel sounds.  No rebound.  No guarding. Central nervous system: Alert and oriented. No focal neurological deficits. Extremities: Symmetric 5 x 5 power. Skin: No rashes, lesions or ulcers Psychiatry: Judgement and insight appear normal. Mood & affect appropriate.     Data Reviewed: I have personally reviewed following labs and imaging studies  CBC: Recent Labs  Lab 03/21/23 0316  WBC 6.3  HGB 11.0*  HCT 35.5*  MCV 98.3  PLT 110*    Basic Metabolic Panel: Recent Labs  Lab 03/21/23 0316 03/22/23 0328  NA 139 139  K 3.8 3.4*  CL 100 101  CO2 28 29  GLUCOSE 97 89  BUN 22 18  CREATININE 0.45 0.52  CALCIUM 8.4* 7.9*  MG 2.1  --     GFR: Estimated Creatinine Clearance: 61.6 mL/min (by C-G formula based on SCr of 0.52 mg/dL).  Liver Function Tests: No results for input(s): "AST", "ALT", "ALKPHOS", "BILITOT", "PROT", "ALBUMIN" in the last 168 hours.  CBG: No results for input(s): "GLUCAP" in the  last 168 hours.   No results found for this or any previous visit (from the past 240 hour(s)).       Radiology Studies: No results found.      Scheduled Meds:  apixaban  5 mg Oral BID   Chlorhexidine Gluconate Cloth  6 each Topical QHS   feeding supplement  237 mL Oral BID BM   folic acid  1 mg Oral Daily   levothyroxine  100 mcg Oral Q0600   methylPREDNISolone  10 mg Oral Daily   mirtazapine  30 mg Oral QHS   Continuous Infusions:   LOS: 22 days    Time spent: 14 MINS    Ramiro Harvest, MD [facility]   To contact the attending provider between 7A-7P or the covering provider during after hours 7P-7A, please log into the web site [email or web address] and access using universal [place] password for that web site. If you do not have the password, please call the  hospital operator.  03/23/2023, 9:56 AM

## 2023-03-23 NOTE — Plan of Care (Signed)
Problem: Health Behavior/Discharge Planning: Goal: Ability to manage health-related needs will improve Outcome: Progressing   Problem: Clinical Measurements: Goal: Ability to maintain clinical measurements within normal limits will improve Outcome: Progressing   Problem: Activity: Goal: Risk for activity intolerance will decrease Outcome: Progressing   Problem: Pain Managment: Goal: General experience of comfort will improve Outcome: Progressing   Problem: Safety: Goal: Ability to remain free from injury will improve Outcome: Progressing

## 2023-03-24 ENCOUNTER — Inpatient Hospital Stay: Payer: PPO | Admitting: Internal Medicine

## 2023-03-24 ENCOUNTER — Inpatient Hospital Stay: Payer: PPO

## 2023-03-24 DIAGNOSIS — C3491 Malignant neoplasm of unspecified part of right bronchus or lung: Secondary | ICD-10-CM | POA: Diagnosis not present

## 2023-03-24 DIAGNOSIS — I270 Primary pulmonary hypertension: Secondary | ICD-10-CM

## 2023-03-24 DIAGNOSIS — E876 Hypokalemia: Secondary | ICD-10-CM | POA: Diagnosis not present

## 2023-03-24 DIAGNOSIS — J9601 Acute respiratory failure with hypoxia: Secondary | ICD-10-CM | POA: Diagnosis not present

## 2023-03-24 LAB — BASIC METABOLIC PANEL
Anion gap: 6 (ref 5–15)
BUN: 21 mg/dL (ref 8–23)
CO2: 27 mmol/L (ref 22–32)
Calcium: 8.3 mg/dL — ABNORMAL LOW (ref 8.9–10.3)
Chloride: 106 mmol/L (ref 98–111)
Creatinine, Ser: 0.49 mg/dL (ref 0.44–1.00)
GFR, Estimated: >60 mL/min (ref >60)
Glucose, Bld: 101 mg/dL — ABNORMAL HIGH (ref 70–99)
Potassium: 3.6 mmol/L (ref 3.5–5.1)
Sodium: 139 mmol/L (ref 135–145)

## 2023-03-24 MED ORDER — FUROSEMIDE 10 MG/ML IJ SOLN
40.0000 mg | Freq: Once | INTRAMUSCULAR | Status: DC
  Filled 2023-03-24: qty 4

## 2023-03-24 NOTE — Progress Notes (Addendum)
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 She was admitted for acute respiratory failure with hypoxia sec to bil bronchopneumonia vs pneumonitis from immunotherapy. PCCM consulted and recommendations given.   She was treated with antibiotics and steroids and currently on a tapering dose of steroids.  Therapy evaluations recommending SNF, insurance denied and she appealed.  She is medically stable for discharge.      Assessment & Plan:   Principal Problem:   Acute respiratory failure with hypoxia (HCC) Active Problems:   Hypokalemia   Hypertension   Hypothyroidism   Adenocarcinoma of right lung, stage 4 (HCC)   Constipation   Normocytic anemia   Moderate protein malnutrition (HCC)   Occlusion of left pulmonary artery (HCC)   Cardiomegaly   Pleural effusion due to CHF (congestive heart failure) (HCC)   Acute deep vein thrombosis (DVT) of femoral vein of both lower extremities (HCC)  #1 acute hypoxic respiratory failure secondary to multifocal bilateral bronchopneumonia/pulmonary infiltrates in the setting of stage IV right lung adenocarcinoma -Imaging done noted bony spinal mets, pulmonary nodules, lymphadenopathy with bilateral multifocal opacities. -Differential include opportunistic infection versus pneumonitis either to radiation or immunotherapy versus pneumonia. -Patient noted to have elevated BNP 2D echo with normal LV, right ventricular function patient received some IV Lasix  initially. -Patient seen in consultation by PCCM who recommended high-dose steroid trial with 125 mg of Solu-Medrol x 3 days and then patient was placed on Solu-Medrol 60 mg IV daily since 03/05/2023 per PCCM recommendation who recommended eventual transition to prednisone taper for 3 weeks. -O2 was tried to be weaned off however sats of 85% on ambulation noted early on in the hospitalization and patient appears very deconditioned and requiring short-term SNF. -Patient on exam with some bibasilar crackles and some lower extremity edema. -Lasix 20 mg IV every 12 hours x 2 doses was done on 03/20/2023, patient subsequently received Lasix 40 mg IV daily times the past 2 days, urine output of 300 cc recorded however not accurate.   -Volume status improving still with some bibasilar crackles and improving lower extremity edema.   -Will give a dose of Lasix 40 mg IV x 1 today.(Per RN patient refused) -Patient seen by PT /OT recommending SNF, TOC consulted for SNF placement. -Continue oxygen, bronchodilators, steroid taper, Eliquis. -Patient's oncologist, Dr. Arlis Porta aware of admission and was in agreement with current management and plan to start on treatment once his symptoms improve next week and may consider removing Keytruda from treatment plan and continue with Alimta due to her episode of pneumonitis.  2.  Anemia -Resolved. -Hemoglobin stable at 11.0.    3.  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 -Eliquis.  4.  Hypertension -Stable.  5.  Hypothyroidism -Synthroid.  6.  Hypokalemia -Potassium at 3.6.   -Repeat labs in the AM.   7.  Constipation -Patient on Senokot as daily however noted to be refusing.   -Senokot-S changed  to as needed.    8.  Moderate protein calorie malnutrition -- Remeron nightly.   -Diet liberalized to  regular diet.     DVT prophylaxis: Eliquis Code Status: DNR Family Communication: Updated patient.  No family at bedside. Disposition: SNF versus home with home health  Status is: Inpatient Remains inpatient appropriate because: Severity of illness   Consultants:  PCCM: Dr. Delton Coombes 03/02/2023 Palliative care: Dr. Linna Darner 03/13/2023  Procedures:  CT angiogram chest 03/01/2023 Chest x-ray 03/01/2023, 03/02/2023, 03/06/2023, 03/17/2023 2D echo 03/02/2023 Lower extremity Dopplers 03/02/2023  Antimicrobials:  Anti-infectives (From admission, onward)    Start     Dose/Rate Route Frequency Ordered Stop   03/01/23 1015  azithromycin (ZITHROMAX) 500 mg in sodium chloride 0.9 % 250 mL IVPB  Status:  Discontinued        500 mg 250 mL/hr over 60 Minutes Intravenous Every 24 hours 03/01/23 1009 03/04/23 0856   03/01/23 1015  cefTRIAXone (ROCEPHIN) 1 g in sodium chloride 0.9 % 100 mL IVPB  Status:  Discontinued        1 g 200 mL/hr over 30 Minutes Intravenous Every 24 hours 03/01/23 1009 03/04/23 0856         Subjective: Patient lying in bed.  Denies any chest pain or shortness of breath.  No nausea or vomiting.  No abdominal pain.  States ambulates in hallway with O2 without any significant worsening shortness of breath.     Objective: Vitals:   03/23/23 0517 03/23/23 1419 03/23/23 2212 03/24/23 0550  BP: 136/83 136/78 130/81 (!) 146/75  Pulse: 77 100 96 80  Resp: 19 20 20 16   Temp: 98.3 F (36.8 C) 98.4 F (36.9 C) 98.5 F (36.9 C) 97.8 F (36.6 C)  TempSrc: Oral Oral Oral Oral  SpO2: 93% 93% 95% 94%  Weight:      Height:        Intake/Output Summary (Last 24 hours) at 03/24/2023 1210 Last data filed at 03/24/2023 0752 Gross per 24 hour  Intake 540 ml  Output --  Net 540 ml   Filed Weights   03/01/23 0844 03/01/23 2000 03/11/23 1318  Weight: 67.1 kg 66.8 kg 70.4 kg    Examination:  General exam: NAD. Respiratory system: Decreased and bibasilar crackles.  No wheezes,  fair air movement.  Speaking in full sentences. Cardiovascular system: RRR no murmurs rubs or gallops.  No JVD.  1+ bilateral lower extremity edema. Gastrointestinal system: Abdomen soft, nontender, nondistended, positive bowel sounds.  No rebound.  No guarding.  Central nervous system: Alert and oriented. No focal neurological deficits. Extremities: Symmetric 5 x 5 power. Skin: No rashes, lesions or ulcers Psychiatry: Judgement and insight appear normal. Mood & affect appropriate.     Data Reviewed: I have personally reviewed following labs and imaging studies  CBC: Recent Labs  Lab 03/21/23 0316  WBC 6.3  HGB 11.0*  HCT 35.5*  MCV 98.3  PLT 110*    Basic Metabolic Panel: Recent Labs  Lab 03/21/23 0316 03/22/23 0328 03/23/23 1445 03/24/23 0249  NA 139 139 140 139  K 3.8 3.4* 3.8 3.6  CL 100 101 101 106  CO2 28 29 28 27   GLUCOSE 97 89 133* 101*  BUN 22 18 20 21   CREATININE 0.45 0.52 0.49 0.49  CALCIUM 8.4* 7.9* 8.4* 8.3*  MG 2.1  --   --   --     GFR: Estimated Creatinine Clearance: 61.6 mL/min (by C-G formula based on SCr of 0.49 mg/dL).  Liver  Function Tests: No results for input(s): "AST", "ALT", "ALKPHOS", "BILITOT", "PROT", "ALBUMIN" in the last 168 hours.  CBG: No results for input(s): "GLUCAP" in the last 168 hours.   No results found for this or any previous visit (from the past 240 hour(s)).       Radiology Studies: No results found.      Scheduled Meds:  apixaban  5 mg Oral BID   Chlorhexidine Gluconate Cloth  6 each Topical QHS   feeding supplement  237 mL Oral BID BM   folic acid  1 mg Oral Daily   levothyroxine  100 mcg Oral Q0600   methylPREDNISolone  10 mg Oral Daily   mirtazapine  30 mg Oral QHS   Continuous Infusions:   LOS: 23 days    Time spent: 42 MINS    Ramiro Harvest, MD [facility]   To contact the attending provider between 7A-7P or the covering provider during after hours 7P-7A, please log into the  web site [email or web address] and access using universal [place] password for that web site. If you do not have the password, please call the hospital operator.  03/24/2023, 12:10 PM

## 2023-03-24 NOTE — TOC Progression Note (Signed)
Transition of Care [facility]) - Progression Note    Patient Details  Name: NDIA SAMPATH MRN: [id number] Date of Birth: 07-Dec-1950  [facility] of [facility] CM/SW Contact  Amada Jupiter, LCSW Phone Number: 03/24/2023, 3:43 PM  Clinical Narrative:     Have alerted pt/ daughter MD/ treatment team that the latest insurance appeal for SNF coverage has been denied.  Pt agrees that she has continued to make progress with mobility and ADLs and she and daughter are agreeable with change in plan to home discharge with [facility] follow up.  Pt requests [facility] for services.  She is aware that she will need O2 for home use and has no agency preference to supply this.  Have placed order with [employer] who will be reaching out to set up home and portable deliveries.  Anticipating dc home tomorrow.       Expected Discharge Plan and Services                                               Social Determinants of Health (SDOH) Interventions SDOH Screenings   Food Insecurity: No Food Insecurity (03/01/2023)  Housing: Low Risk  (03/01/2023)  Transportation Needs: No Transportation Needs (03/01/2023)  Utilities: Not At Risk (03/01/2023)  Depression (PHQ2-9): Low Risk  (10/02/2022)  Tobacco Use: High Risk (03/01/2023)    Readmission Risk Interventions    03/04/2023    6:23 PM  Readmission Risk Prevention Plan  Transportation Screening Complete  PCP or Specialist Appt within 3-5 Days Complete  HRI or Home Care Consult Complete  Social Work Consult for Recovery Care Planning/Counseling Complete  Palliative Care Screening Not Applicable  Medication Review Oceanographer) Complete

## 2023-03-24 NOTE — Progress Notes (Signed)
Physical Therapy Treatment Patient Details Name: Hayley Wu MRN: [id number] DOB: [birth year] Today's Date: 03/24/2023   History of Present Illness Pt is 72 yo female admitted on 03/05/23 with acute respiratory failure and found to have new bil pulmonary infiltrates.  Pt with bil acute DVT started on Eliquis and subacute L PE.  Pt with hx including but not limited to stage IV adenocarcinoma lung, hepatic and bony metastases that required T7 laminectomy due to spinal cord compression 09/2022    PT Comments   Pt admitted with above diagnosis.  Pt currently with functional limitations due to the deficits listed below (see PT Problem List). Pt in bed when PT arrived. Pt agreeable to therapy intervention. Pt states trying to wean from O2, currently on 0.5 L/min and 94% at rest. Pt reports still seeking SNF placement and concerns per d/c home alone. Pt is mod I for bed mobility, mod I for sit to stand  from EOB to RW, S for safety with proper RW for SPT. Gait tasks with RW, CGA progressing to close S and min cues for attention to foot placement, L toe out and R knee hyperextension in stance phase. Pt demonstrated improved gait tolerance 85 feet x 2 with standing rest break. Pt desaturated to 86% with first amb bout on 0.5 L/min and required 30s to 90% with cues for pursed lip breathing to recover and second amb bout pt desaturated to 86% on 0.5 L/min and required 42s to recover to 90%. Pt left seated in recliner and all needs in place. Pt will benefit from acute skilled PT to increase their independence and safety with mobility to allow discharge.      If plan is discharge home, recommend the following: A little help with walking and/or transfers;A little help with bathing/dressing/bathroom;Assistance with cooking/housework;Help with stairs or ramp for entrance   Can travel by private vehicle     Yes  Equipment Recommendations  None recommended by PT    Recommendations for Other Services        Precautions / Restrictions Precautions Precautions: Fall Precaution Comments: monitor sats Restrictions Weight Bearing Restrictions: No     Mobility  Bed Mobility Overal bed mobility: Modified Independent Bed Mobility: Supine to Sit     Supine to sit: Modified independent (Device/Increase time), HOB elevated, Used rails          Transfers Overall transfer level: Modified independent Equipment used: Rolling walker (2 wheels) Transfers: Sit to/from Stand Sit to Stand: Modified independent (Device/Increase time) Stand pivot transfers: Supervision         General transfer comment: Mod I with RW for sit to stand from EOB. pt requires S and min cues for SPT at RW level for proper placement of AD    Ambulation/Gait Ambulation/Gait assistance: Supervision Gait Distance (Feet): 85 Feet Assistive device: Rolling walker (2 wheels) Gait Pattern/deviations: Step-to pattern, Decreased stride length, Trunk flexed, Narrow base of support, Scissoring, Knee hyperextension - right Gait velocity: decreased     General Gait Details: improved gait tolerance with 85 feet x 2 with standing rest break, min cus for posture and safety as well as attention to LE placement with L occational scissior step with significant toe out and R with hyperextension in stance phase pt reports AFO in home setting, however pt able to clear B LEs with gait tasks   Stairs             Wheelchair Mobility     Tilt Bed  Modified Rankin (Stroke Patients Only)       Balance Overall balance assessment: Needs assistance Sitting-balance support: No upper extremity supported Sitting balance-Leahy Scale: Good     Standing balance support: Bilateral upper extremity supported, Reliant on assistive device for balance, During functional activity Standing balance-Leahy Scale: Poor Standing balance comment: RW for ambulation                            Cognition Arousal: Alert Behavior  During Therapy: WFL for tasks assessed/performed Overall Cognitive Status: Within Functional Limits for tasks assessed                                 General Comments: AxO x 4 pleasant Lady who was living home alone [place] [employer] Comments General comments (skin integrity, edema, etc.): 0.5 L/min throughtout tx session      Pertinent Vitals/Pain      Home Living Family/patient expects to be discharged to:: Private residence Living Arrangements: Alone Available Help at Discharge: Family;Available PRN/intermittently Type of Home: House Home Access: Level entry       Home Layout: One level Home Equipment: Tub bench;Rolling Walker (2 wheels);Wheelchair - manual;Hand held shower head;Lift chair;BSC/3in1 Additional Comments: lives alone plans to go to sister's house at discharge    Prior Function            PT Goals (current goals can now be found in the care plan section) Acute Rehab PT Goals Patient Stated Goal: return home but open to rehab if needed due to limited support PT Goal Formulation: With patient Time For Goal Achievement: 04/04/23 Potential to Achieve Goals: Good Progress towards PT goals: Progressing toward goals    Frequency    Min 1X/week      PT Plan      Co-evaluation              AM-PAC PT "6 Clicks" Mobility   Outcome Measure  Help needed turning from your back to your side while in a flat bed without using bedrails?: None Help needed moving from lying on your back to sitting on the side of a flat bed without using bedrails?: None Help needed moving to and from a bed to a chair (including a wheelchair)?: A Little Help needed standing up from a chair using your arms (e.g., wheelchair or bedside chair)?: None Help needed to walk in hospital room?: A Little Help needed climbing 3-5 steps with a railing? : A Lot 6 Click Score: 20    End of Session Equipment Utilized During Treatment: Gait  belt Activity Tolerance: Patient tolerated treatment well;Patient limited by fatigue Patient left: with call bell/phone within reach Nurse Communication: Mobility status PT Visit Diagnosis: Other abnormalities of gait and mobility (R26.89);Difficulty in walking, not elsewhere classified (R26.2)     Time: 1107-1130 PT Time Calculation (min) (ACUTE ONLY): 23 min  Charges:    $Gait Training: 8-22 mins $Therapeutic Activity: 8-22 mins PT General Charges $$ ACUTE PT VISIT: 1 Visit                     Johnny Bridge, PT Acute Rehab   Jacqualyn Posey 03/24/2023, 12:04 PM

## 2023-03-25 ENCOUNTER — Other Ambulatory Visit (HOSPITAL_COMMUNITY): Payer: Self-pay

## 2023-03-25 ENCOUNTER — Encounter: Payer: Self-pay | Admitting: Internal Medicine

## 2023-03-25 DIAGNOSIS — E440 Moderate protein-calorie malnutrition: Secondary | ICD-10-CM | POA: Diagnosis not present

## 2023-03-25 DIAGNOSIS — I82413 Acute embolism and thrombosis of femoral vein, bilateral: Secondary | ICD-10-CM | POA: Diagnosis not present

## 2023-03-25 DIAGNOSIS — J9601 Acute respiratory failure with hypoxia: Secondary | ICD-10-CM | POA: Diagnosis not present

## 2023-03-25 DIAGNOSIS — E876 Hypokalemia: Secondary | ICD-10-CM | POA: Diagnosis not present

## 2023-03-25 LAB — CBC
HCT: 36.8 % (ref 36.0–46.0)
Hemoglobin: 11.2 g/dL — ABNORMAL LOW (ref 12.0–15.0)
MCH: 30.7 pg (ref 26.0–34.0)
MCHC: 30.4 g/dL (ref 30.0–36.0)
MCV: 100.8 fL — ABNORMAL HIGH (ref 80.0–100.0)
Platelets: 110 10*3/uL — ABNORMAL LOW (ref 150–400)
RBC: 3.65 MIL/uL — ABNORMAL LOW (ref 3.87–5.11)
RDW: 15.5 % (ref 11.5–15.5)
WBC: 4.9 10*3/uL (ref 4.0–10.5)
nRBC: 0 % (ref 0.0–0.2)

## 2023-03-25 LAB — BASIC METABOLIC PANEL
Anion gap: 7 (ref 5–15)
BUN: 21 mg/dL (ref 8–23)
CO2: 27 mmol/L (ref 22–32)
Calcium: 8.1 mg/dL — ABNORMAL LOW (ref 8.9–10.3)
Chloride: 104 mmol/L (ref 98–111)
Creatinine, Ser: 0.48 mg/dL (ref 0.44–1.00)
GFR, Estimated: >60 mL/min (ref >60)
Glucose, Bld: 82 mg/dL (ref 70–99)
Potassium: 3.6 mmol/L (ref 3.5–5.1)
Sodium: 138 mmol/L (ref 135–145)

## 2023-03-25 MED ORDER — SENNA 8.6 MG PO TABS: 1.0000 | ORAL_TABLET | Freq: Every day | ORAL | Status: DC | PRN

## 2023-03-25 MED ORDER — APIXABAN 5 MG PO TABS
5.0000 mg | ORAL_TABLET | Freq: Two times a day (BID) | ORAL | 2 refills | Status: AC
  Filled 2023-03-25: qty 60, 30d supply, fill #0
  Filled 2023-04-19: qty 60, 30d supply, fill #1

## 2023-03-25 MED ORDER — LEVOTHYROXINE SODIUM 100 MCG PO TABS
100.0000 ug | ORAL_TABLET | Freq: Every day | ORAL | 1 refills | Status: DC
  Filled 2023-03-25: qty 30, 30d supply, fill #0

## 2023-03-25 MED ORDER — HEPARIN SOD (PORK) LOCK FLUSH 100 UNIT/ML IV SOLN
500.0000 [IU] | INTRAVENOUS | Status: AC | PRN
  Administered 2023-03-25: 500 [IU]

## 2023-03-25 MED ORDER — IPRATROPIUM-ALBUTEROL 20-100 MCG/ACT IN AERS
1.0000 | INHALATION_SPRAY | Freq: Four times a day (QID) | RESPIRATORY_TRACT | 1 refills | Status: DC | PRN
  Filled 2023-03-25: qty 4, 30d supply, fill #0

## 2023-03-25 NOTE — Progress Notes (Signed)
Physical Therapy Treatment Patient Details Name: Hayley Wu MRN: [id number] DOB: December 13, 1950 Today's Date: 03/25/2023   History of Present Illness Pt is 72 yo female admitted on 03/05/23 with acute respiratory failure and found to have new bil pulmonary infiltrates.  Pt with bil acute DVT started on Eliquis and subacute L PE.  Pt with hx including but not limited to stage IV adenocarcinoma lung, hepatic and bony metastases that required T7 laminectomy due to spinal cord compression 09/2022    PT Comments   Pt admitted with above diagnosis.  Pt currently with functional limitations due to the deficits listed below (see PT Problem List). Pt seated EOB when PT arrived. Pt indicated she was going home today with [facility] services. Pt is now mod I for bed mobility and transfer tasks at RW level, on RA at rest 92%, RA for duration of intervention today with pt amb S, RW and min cues 85 feet x 2 with one standing rest break. First amb bout pt desaturated to 86% on RA cues for pursed lip breathing and recovery in standing 53s to 90%. Second amb bout pt desaturated to 88% on RA and required seated therapeutic rest break of 1:02 to recover to 90%. Pt ed provided on energy conservation, fall risk prevention and pursed lip breathing. Pt demonstrated verbal understanding and is motivated to return home.  Pt will benefit from acute skilled PT to increase their independence and safety with mobility to allow discharge.      If plan is discharge home, recommend the following: A little help with walking and/or transfers;A little help with bathing/dressing/bathroom;Assistance with cooking/housework;Help with stairs or ramp for entrance   Can travel by private vehicle     Yes  Equipment Recommendations  None recommended by PT    Recommendations for Other Services       Precautions / Restrictions Precautions Precautions: Fall Precaution Comments: monitor sats Restrictions Weight Bearing Restrictions: No      Mobility  Bed Mobility Overal bed mobility: Modified Independent Bed Mobility: Supine to Sit, Sit to Supine     Supine to sit: Modified independent (Device/Increase time), HOB elevated, Used rails Sit to supine: Modified independent (Device/Increase time), HOB elevated, Used rails        Transfers Overall transfer level: Modified independent Equipment used: Rolling walker (2 wheels) Transfers: Sit to/from Stand Sit to Stand: Modified independent (Device/Increase time)                Ambulation/Gait Ambulation/Gait assistance: Chief Operating Officer (Feet): 85 Feet Assistive device: Rolling walker (2 wheels) Gait Pattern/deviations: Step-to pattern, Decreased stride length, Trunk flexed, Narrow base of support, Knee hyperextension - right Gait velocity: decreased     General Gait Details: improved gait tolerance on RA with 85 feet x 2 with one standing rest break, min cus for posture and safety as well as attention to LE placement with L occational scissior step with slight toe out and R with hyperextension in stance phase pt reports AFO in home setting, however pt able to clear B LEs with gait tasks   Stairs             Wheelchair Mobility     Tilt Bed    Modified Rankin (Stroke Patients Only)       Balance Overall balance assessment: Needs assistance Sitting-balance support: No upper extremity supported Sitting balance-Leahy Scale: Good     Standing balance support: During functional activity Standing balance-Leahy Scale: Fair Standing balance comment: RW for ambulation  Cognition Arousal: Alert Behavior During Therapy: WFL for tasks assessed/performed Overall Cognitive Status: Within Functional Limits for tasks assessed                                 General Comments: AxO x 4 pleasant Lady who was living home alone Indep        Exercises      General Comments         Pertinent Vitals/Pain      Home Living Family/patient expects to be discharged to:: Private residence Living Arrangements: Alone Available Help at Discharge: Family;Available PRN/intermittently Type of Home: House Home Access: Level entry       Home Layout: One level Home Equipment: Tub bench;Rolling Walker (2 wheels);Wheelchair - manual;Hand held shower head;Lift chair;BSC/3in1 Additional Comments: lives alone plans to go to sister's house at discharge    Prior Function            PT Goals (current goals can now be found in the care plan section) Acute Rehab PT Goals Patient Stated Goal: return home but open to rehab if needed due to limited support PT Goal Formulation: With patient Time For Goal Achievement: 04/04/23 Potential to Achieve Goals: Good Progress towards PT goals: Progressing toward goals    Frequency    Min 1X/week      PT Plan      Co-evaluation              AM-PAC PT "6 Clicks" Mobility   Outcome Measure  Help needed turning from your back to your side while in a flat bed without using bedrails?: None Help needed moving from lying on your back to sitting on the side of a flat bed without using bedrails?: None Help needed moving to and from a bed to a chair (including a wheelchair)?: None Help needed standing up from a chair using your arms (e.g., wheelchair or bedside chair)?: None Help needed to walk in hospital room?: A Little Help needed climbing 3-5 steps with a railing? : A Lot 6 Click Score: 21    End of Session Equipment Utilized During Treatment: Gait belt Activity Tolerance: Patient tolerated treatment well;Patient limited by fatigue Patient left: with call bell/phone within reach;in bed Nurse Communication: Mobility status PT Visit Diagnosis: Other abnormalities of gait and mobility (R26.89);Difficulty in walking, not elsewhere classified (R26.2)     Time: 3235-5732 PT Time Calculation (min) (ACUTE ONLY): 23  min  Charges:    $Gait Training: 8-22 mins $Therapeutic Activity: 8-22 mins PT General Charges $$ ACUTE PT VISIT: 1 Visit                     Hayley Wu, PT Acute Rehab    Hayley Wu 03/25/2023, 11:23 AM

## 2023-03-25 NOTE — Progress Notes (Signed)
Occupational Therapy Treatment Patient Details Name: Hayley Wu MRN: [id number] DOB: Jan 17, 1951 Today's Date: 03/25/2023   History of present illness Pt is 72 yo female admitted on 03/05/23 with acute respiratory failure and found to have new bil pulmonary infiltrates.  Pt with bil acute DVT started on Eliquis and subacute L PE.  Pt with hx including but not limited to stage IV adenocarcinoma lung, hepatic and bony metastases that required T7 laminectomy due to spinal cord compression 09/2022   OT comments  Patient plans to d/c home today with [facility] services and family support. Patient was able to participate in toilet transfers at [facility] level with CGA with MI for seated hygiene tasks. Patient demonstrated good insight to deficits and safety awareness during session.  Patient's discharge plan remains appropriate at this time. OT will continue to follow acutely.        If plan is discharge home, recommend the following:  A little help with walking and/or transfers;Assist for transportation;Assistance with cooking/housework;A little help with bathing/dressing/bathroom   Equipment Recommendations  None recommended by OT       Precautions / Restrictions Precautions Precautions: Fall Precaution Comments: monitor sats Restrictions Weight Bearing Restrictions: No       Mobility Bed Mobility Overal bed mobility: Modified Independent                         Balance Overall balance assessment: Needs assistance Sitting-balance support: No upper extremity supported Sitting balance-Leahy Scale: Good     Standing balance support: During functional activity Standing balance-Leahy Scale: Fair       ADL either performed or assessed with clinical judgement   ADL Overall ADL's : Needs assistance/impaired       Toilet Transfer: Ambulation;Rolling walker (2 wheels);Contact guard assist Toilet Transfer Details (indicate cue type and reason): with cues for using walker to complete  full turn and then holding grab bar if needed. Toileting- Clothing Manipulation and Hygiene: Set up;Sitting/lateral lean Toileting - Clothing Manipulation Details (indicate cue type and reason): with increased time.       General ADL Comments: patient demonstrated good safety awareness during session. patient was educated on maintaining her safety awareness in next level of care and advocating for herself with her safety awareness. patient verbalized understanding. patient reported plan was to d/c home today. patient reported that she would have HH therapy at home. patient was educated on environmental modifications that therapy should check on for her at home. patient verbalized understanding.      Cognition Arousal: Alert Behavior During Therapy: WFL for tasks assessed/performed Overall Cognitive Status: Within Functional Limits for tasks assessed         General Comments: AxO x 4 pleasant Lady who was living home alone Indep                   Pertinent Vitals/ Pain       Pain Assessment Pain Assessment: No/denies pain         Frequency  Min 1X/week        Progress Toward Goals  OT Goals(current goals can now be found in the care plan section)  Progress towards OT goals: Progressing toward goals     Plan         AM-PAC OT "6 Clicks" Daily Activity     Outcome Measure   Help from another person eating meals?: None Help from another person taking care of personal grooming?: A Little Help from another  person toileting, which includes using toliet, bedpan, or urinal?: A Little Help from another person bathing (including washing, rinsing, drying)?: A Little Help from another person to put on and taking off regular upper body clothing?: A Little Help from another person to put on and taking off regular lower body clothing?: A Little 6 Click Score: 19    End of Session Equipment Utilized During Treatment: Rolling walker (2 wheels)  OT Visit Diagnosis:  Unsteadiness on feet (R26.81);Muscle weakness (generalized) (M62.81)   Activity Tolerance Patient tolerated treatment well   Patient Left in bed;with call bell/phone within reach   Nurse Communication Mobility status        Time: 1610-9604 OT Time Calculation (min): 23 min  Charges: OT General Charges $OT Visit: 1 Visit OT Treatments $Self Care/Home Management : 23-37 mins  Rosalio Loud, MS Acute Rehabilitation Department Office# [phone]   Selinda Flavin 03/25/2023, 10:31 AM

## 2023-03-25 NOTE — Progress Notes (Signed)
Patient discharged home with home health, port de-accessed, discharge paperwork provided and explained, patient verbalized understanding, medications delivered at the bedside from [facility].

## 2023-03-25 NOTE — Discharge Summary (Signed)
FV Mid   Full                                                        +---------+---------------+---------+-----------+----------+--------------+ FV DistalFull                                                        +---------+---------------+---------+-----------+----------+--------------+ PFV      Partial        Yes      Yes                  Acute          +---------+---------------+---------+-----------+----------+--------------+ POP      Full           Yes      Yes                                 +---------+---------------+---------+-----------+----------+--------------+ PTV      Full                                                        +---------+---------------+---------+-----------+----------+--------------+ PERO     Full                                                        +---------+---------------+---------+-----------+----------+--------------+ EIV                     Yes      Yes                                 +---------+---------------+---------+-----------+----------+--------------+     Summary: RIGHT: - Findings consistent with acute deep vein thrombosis involving the right common femoral vein, right femoral vein, and right popliteal vein.  - No cystic structure found in the popliteal fossa. - The external iliac vein appears patent.  LEFT: - Findings consistent with acute deep vein thrombosis involving the left common femoral vein, and left proximal profunda vein.  - No cystic structure found in the popliteal fossa. - The external iliac vein appears patent.  *See table(s) above  for measurements and observations. Electronically signed by Coral Else MD on 03/02/2023 at 11:31:49 AM.    Final    ECHOCARDIOGRAM COMPLETE  Result Date: 03/02/2023    ECHOCARDIOGRAM REPORT   Patient Name:   Hayley Wu Date of Exam: 03/02/2023 Medical Rec #:  [id number]       Height:       63.0 in Accession #:    [id number]      Weight:       147.3 lb Date of Birth:  25-Mar-1951      BSA:  Physician Discharge Summary  Hayley Wu WUJ:[id number] DOB: 08-17-1950 DOA: 03/01/2023  PCP: [name], PA-C  Admit date: 03/01/2023 Discharge date: 03/25/2023  Time spent: 55 minutes  Recommendations for Outpatient Follow-up:  Follow-up with [name], PA-C in 2 weeks.  On follow-up patient will need a basic metabolic profile done to follow-up on electrolytes and renal function.  Patient will need a CBC done to follow-up on counts.  Patient will need ambulatory sats down on follow-up.  Patient will need follow-up on hypothyroidism and repeat TFTs done Follow-up with Dr. Si Gaul, oncology in 2 weeks.   Discharge Diagnoses:  Principal Problem:   Acute respiratory failure with hypoxia (HCC) Active Problems:   Hypokalemia   Hypertension   Hypothyroidism   Adenocarcinoma of right lung, stage 4 (HCC)   Constipation   Normocytic anemia   Moderate protein malnutrition (HCC)   Occlusion of left pulmonary artery (HCC)   Cardiomegaly   Pleural effusion due to CHF (congestive heart failure) (HCC)   Acute deep vein thrombosis (DVT) of femoral vein of both lower extremities (HCC)   Discharge Condition: Stable and improved.  Diet recommendation: Regular  Filed Weights   03/01/23 0844 03/01/23 2000 03/11/23 1318  Weight: 67.1 kg 66.8 kg 70.4 kg    History of present illness:  HPI per Dr. Nilsa Nutting is a 72 y.o. 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  When EMS initially evaluated her she was saturating 50% on room air.  The patient became more alert after supplemental oxygen was provided.  She has been coughing which is occasionally productive, occasional mild wheezing, no  pleuritic chest pain, no URI symptoms or sick contacts exposure. She was recently prescribed apixaban (has not started), advised about symptoms of DVT/PE and sent for bilateral lower extremity Doppler, which she has not obtained yet.  Her appetite is decreased and was recently prescribed mirtazapine 30 mg at bedtime. No chest pain, palpitations, diaphoresis, PND, orthopnea or current pitting edema of the lower extremities. No abdominal pain, nausea, emesis, diarrhea,  melena or hematochezia, but has been constipated for a few days. No flank pain, dysuria, frequency or hematuria. No polyuria, polydipsia, polyphagia or blurred vision.    Lab work: Her CBC showed a white count of 7.5, hemoglobin 10.8 g/dL and platelets 956.  PT was 15.3, INR 1.2 and PTT 26.  Negative coronavirus, influenza and RSV PCR.  CMP showed normal renal function, a potassium of 3.0 mmol/L, the rest of the electrolytes are normal after calcium correction.  LFTs with a total protein of 6.0 and albumin of 2.8 g/dL, but otherwise normal.   Imaging: Portable 1 view chest radiograph showing new bilateral opacities, right greater than left, concerning for pneumonia.  CTA chest showed a stable occluded left lower lobe pulmonary artery branch signs 12/22/2022 with no acute pulmonary same.  There are interval changes of acute CHF with interval cardiomegaly, interstitial pulmonary edema and minimal right pleural effusion.  CAD and aortic atherosclerosis.  Please see full radiology report for further details.   ED course: Initial vital signs were temperature 99.9 F, pulse 100, respiration 18, BP 106/62 mmHg and O2 sat 54% on room air.  The patient received NRB oxygen 10 L, azithromycin 500 mg IVP, ceftriaxone 1 g IVP  Specialty: Oncology Why: Follow-up in 2 to 3 weeks. Contact information: [address] [place] [address] [phone]              Contact information for after-discharge care     Destination     [facility] Preferred SNF .   Service: Skilled Nursing Contact information: [address] [place] [address] [phone]                      The results of significant diagnostics from this hospitalization (including imaging, microbiology, ancillary and laboratory) are listed below for reference.    Significant Diagnostic Studies: DG CHEST PORT 1 VIEW  Result Date: 03/17/2023 CLINICAL DATA:  Respiratory distress. EXAM: PORTABLE CHEST 1 VIEW COMPARISON:  03/06/2023 FINDINGS: Right chest wall port a catheter noted with tip at the superior cavoatrial junction. Stable cardiomediastinal contours. Increased lung volumes compared with the previous exam. Bilateral increase interstitial markings are similar to the previous exam without focal consolidative change. Chronic interstitial changes of COPD/emphysema. IMPRESSION: 1. Increased lung volumes compared with the previous exam. 2. Persistent increased interstitial  markings within both lungs which may reflect sequelae of atypical inflammation/infection versus interstitial edema. Electronically Signed   By: Signa Kell M.D.   On: 03/17/2023 10:40   DG Chest Port 1 View  Result Date: 03/06/2023 CLINICAL DATA:  Acute respiratory failure. EXAM: PORTABLE CHEST 1 VIEW COMPARISON:  March 02, 2023 FINDINGS: Stable cardiomediastinal silhouette. Right internal jugular Port-A-Cath is unchanged. Decreased bilateral lung opacities are noted suggesting improving pneumonia or edema. Bony thorax is unremarkable. IMPRESSION: Decreased bilateral lung opacities are noted consistent with improving pneumonia or edema. Electronically Signed   By: Lupita Raider M.D.   On: 03/06/2023 09:15   VAS [place] LOWER EXTREMITY VENOUS (DVT)  Result Date: 03/02/2023  Lower Venous DVT Study Patient Name:  Hayley Wu  Date of Exam:   03/02/2023 Medical Rec #: [id number]        Accession #:    [id number] Date of Birth: [birth year]       Patient Gender: F Patient Age:   89 years Exam Location:  [facility] Procedure:      VAS [place] LOWER EXTREMITY VENOUS (DVT) Referring Phys: DAVID ORTIZ --------------------------------------------------------------------------------  Indications: Edema.  Risk Factors: Cancer. Limitations: Poor ultrasound/tissue interface. Comparison Study: No prior studies. Performing Technologist: Chanda Busing RVT  Examination Guidelines: A complete evaluation includes B-mode imaging, spectral Doppler, color Doppler, and power Doppler as needed of all accessible portions of each vessel. Bilateral testing is considered an integral part of a complete examination. Limited examinations for reoccurring indications may be performed as noted. The reflux portion of the exam is performed with the patient in reverse Trendelenburg.  +---------+---------------+---------+-----------+----------+--------------+ RIGHT    CompressibilityPhasicitySpontaneityPropertiesThrombus Aging  +---------+---------------+---------+-----------+----------+--------------+ CFV      Partial        Yes      Yes                  Acute          +---------+---------------+---------+-----------+----------+--------------+ SFJ      Partial                                      Acute          +---------+---------------+---------+-----------+----------+--------------+ FV Prox  Partial        Yes  Physician Discharge Summary  Hayley Wu WUJ:[id number] DOB: 08-17-1950 DOA: 03/01/2023  PCP: [name], PA-C  Admit date: 03/01/2023 Discharge date: 03/25/2023  Time spent: 55 minutes  Recommendations for Outpatient Follow-up:  Follow-up with [name], PA-C in 2 weeks.  On follow-up patient will need a basic metabolic profile done to follow-up on electrolytes and renal function.  Patient will need a CBC done to follow-up on counts.  Patient will need ambulatory sats down on follow-up.  Patient will need follow-up on hypothyroidism and repeat TFTs done Follow-up with Dr. Si Gaul, oncology in 2 weeks.   Discharge Diagnoses:  Principal Problem:   Acute respiratory failure with hypoxia (HCC) Active Problems:   Hypokalemia   Hypertension   Hypothyroidism   Adenocarcinoma of right lung, stage 4 (HCC)   Constipation   Normocytic anemia   Moderate protein malnutrition (HCC)   Occlusion of left pulmonary artery (HCC)   Cardiomegaly   Pleural effusion due to CHF (congestive heart failure) (HCC)   Acute deep vein thrombosis (DVT) of femoral vein of both lower extremities (HCC)   Discharge Condition: Stable and improved.  Diet recommendation: Regular  Filed Weights   03/01/23 0844 03/01/23 2000 03/11/23 1318  Weight: 67.1 kg 66.8 kg 70.4 kg    History of present illness:  HPI per Dr. Nilsa Nutting is a 72 y.o. 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  When EMS initially evaluated her she was saturating 50% on room air.  The patient became more alert after supplemental oxygen was provided.  She has been coughing which is occasionally productive, occasional mild wheezing, no  pleuritic chest pain, no URI symptoms or sick contacts exposure. She was recently prescribed apixaban (has not started), advised about symptoms of DVT/PE and sent for bilateral lower extremity Doppler, which she has not obtained yet.  Her appetite is decreased and was recently prescribed mirtazapine 30 mg at bedtime. No chest pain, palpitations, diaphoresis, PND, orthopnea or current pitting edema of the lower extremities. No abdominal pain, nausea, emesis, diarrhea,  melena or hematochezia, but has been constipated for a few days. No flank pain, dysuria, frequency or hematuria. No polyuria, polydipsia, polyphagia or blurred vision.    Lab work: Her CBC showed a white count of 7.5, hemoglobin 10.8 g/dL and platelets 956.  PT was 15.3, INR 1.2 and PTT 26.  Negative coronavirus, influenza and RSV PCR.  CMP showed normal renal function, a potassium of 3.0 mmol/L, the rest of the electrolytes are normal after calcium correction.  LFTs with a total protein of 6.0 and albumin of 2.8 g/dL, but otherwise normal.   Imaging: Portable 1 view chest radiograph showing new bilateral opacities, right greater than left, concerning for pneumonia.  CTA chest showed a stable occluded left lower lobe pulmonary artery branch signs 12/22/2022 with no acute pulmonary same.  There are interval changes of acute CHF with interval cardiomegaly, interstitial pulmonary edema and minimal right pleural effusion.  CAD and aortic atherosclerosis.  Please see full radiology report for further details.   ED course: Initial vital signs were temperature 99.9 F, pulse 100, respiration 18, BP 106/62 mmHg and O2 sat 54% on room air.  The patient received NRB oxygen 10 L, azithromycin 500 mg IVP, ceftriaxone 1 g IVP  Physician Discharge Summary  Hayley Wu WUJ:[id number] DOB: 08-17-1950 DOA: 03/01/2023  PCP: [name], PA-C  Admit date: 03/01/2023 Discharge date: 03/25/2023  Time spent: 55 minutes  Recommendations for Outpatient Follow-up:  Follow-up with [name], PA-C in 2 weeks.  On follow-up patient will need a basic metabolic profile done to follow-up on electrolytes and renal function.  Patient will need a CBC done to follow-up on counts.  Patient will need ambulatory sats down on follow-up.  Patient will need follow-up on hypothyroidism and repeat TFTs done Follow-up with Dr. Si Gaul, oncology in 2 weeks.   Discharge Diagnoses:  Principal Problem:   Acute respiratory failure with hypoxia (HCC) Active Problems:   Hypokalemia   Hypertension   Hypothyroidism   Adenocarcinoma of right lung, stage 4 (HCC)   Constipation   Normocytic anemia   Moderate protein malnutrition (HCC)   Occlusion of left pulmonary artery (HCC)   Cardiomegaly   Pleural effusion due to CHF (congestive heart failure) (HCC)   Acute deep vein thrombosis (DVT) of femoral vein of both lower extremities (HCC)   Discharge Condition: Stable and improved.  Diet recommendation: Regular  Filed Weights   03/01/23 0844 03/01/23 2000 03/11/23 1318  Weight: 67.1 kg 66.8 kg 70.4 kg    History of present illness:  HPI per Dr. Nilsa Nutting is a 72 y.o. 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  When EMS initially evaluated her she was saturating 50% on room air.  The patient became more alert after supplemental oxygen was provided.  She has been coughing which is occasionally productive, occasional mild wheezing, no  pleuritic chest pain, no URI symptoms or sick contacts exposure. She was recently prescribed apixaban (has not started), advised about symptoms of DVT/PE and sent for bilateral lower extremity Doppler, which she has not obtained yet.  Her appetite is decreased and was recently prescribed mirtazapine 30 mg at bedtime. No chest pain, palpitations, diaphoresis, PND, orthopnea or current pitting edema of the lower extremities. No abdominal pain, nausea, emesis, diarrhea,  melena or hematochezia, but has been constipated for a few days. No flank pain, dysuria, frequency or hematuria. No polyuria, polydipsia, polyphagia or blurred vision.    Lab work: Her CBC showed a white count of 7.5, hemoglobin 10.8 g/dL and platelets 956.  PT was 15.3, INR 1.2 and PTT 26.  Negative coronavirus, influenza and RSV PCR.  CMP showed normal renal function, a potassium of 3.0 mmol/L, the rest of the electrolytes are normal after calcium correction.  LFTs with a total protein of 6.0 and albumin of 2.8 g/dL, but otherwise normal.   Imaging: Portable 1 view chest radiograph showing new bilateral opacities, right greater than left, concerning for pneumonia.  CTA chest showed a stable occluded left lower lobe pulmonary artery branch signs 12/22/2022 with no acute pulmonary same.  There are interval changes of acute CHF with interval cardiomegaly, interstitial pulmonary edema and minimal right pleural effusion.  CAD and aortic atherosclerosis.  Please see full radiology report for further details.   ED course: Initial vital signs were temperature 99.9 F, pulse 100, respiration 18, BP 106/62 mmHg and O2 sat 54% on room air.  The patient received NRB oxygen 10 L, azithromycin 500 mg IVP, ceftriaxone 1 g IVP  Specialty: Oncology Why: Follow-up in 2 to 3 weeks. Contact information: [address] [place] [address] [phone]              Contact information for after-discharge care     Destination     [facility] Preferred SNF .   Service: Skilled Nursing Contact information: [address] [place] [address] [phone]                      The results of significant diagnostics from this hospitalization (including imaging, microbiology, ancillary and laboratory) are listed below for reference.    Significant Diagnostic Studies: DG CHEST PORT 1 VIEW  Result Date: 03/17/2023 CLINICAL DATA:  Respiratory distress. EXAM: PORTABLE CHEST 1 VIEW COMPARISON:  03/06/2023 FINDINGS: Right chest wall port a catheter noted with tip at the superior cavoatrial junction. Stable cardiomediastinal contours. Increased lung volumes compared with the previous exam. Bilateral increase interstitial markings are similar to the previous exam without focal consolidative change. Chronic interstitial changes of COPD/emphysema. IMPRESSION: 1. Increased lung volumes compared with the previous exam. 2. Persistent increased interstitial  markings within both lungs which may reflect sequelae of atypical inflammation/infection versus interstitial edema. Electronically Signed   By: Signa Kell M.D.   On: 03/17/2023 10:40   DG Chest Port 1 View  Result Date: 03/06/2023 CLINICAL DATA:  Acute respiratory failure. EXAM: PORTABLE CHEST 1 VIEW COMPARISON:  March 02, 2023 FINDINGS: Stable cardiomediastinal silhouette. Right internal jugular Port-A-Cath is unchanged. Decreased bilateral lung opacities are noted suggesting improving pneumonia or edema. Bony thorax is unremarkable. IMPRESSION: Decreased bilateral lung opacities are noted consistent with improving pneumonia or edema. Electronically Signed   By: Lupita Raider M.D.   On: 03/06/2023 09:15   VAS [place] LOWER EXTREMITY VENOUS (DVT)  Result Date: 03/02/2023  Lower Venous DVT Study Patient Name:  Hayley Wu  Date of Exam:   03/02/2023 Medical Rec #: [id number]        Accession #:    [id number] Date of Birth: [birth year]       Patient Gender: F Patient Age:   89 years Exam Location:  [facility] Procedure:      VAS [place] LOWER EXTREMITY VENOUS (DVT) Referring Phys: DAVID ORTIZ --------------------------------------------------------------------------------  Indications: Edema.  Risk Factors: Cancer. Limitations: Poor ultrasound/tissue interface. Comparison Study: No prior studies. Performing Technologist: Chanda Busing RVT  Examination Guidelines: A complete evaluation includes B-mode imaging, spectral Doppler, color Doppler, and power Doppler as needed of all accessible portions of each vessel. Bilateral testing is considered an integral part of a complete examination. Limited examinations for reoccurring indications may be performed as noted. The reflux portion of the exam is performed with the patient in reverse Trendelenburg.  +---------+---------------+---------+-----------+----------+--------------+ RIGHT    CompressibilityPhasicitySpontaneityPropertiesThrombus Aging  +---------+---------------+---------+-----------+----------+--------------+ CFV      Partial        Yes      Yes                  Acute          +---------+---------------+---------+-----------+----------+--------------+ SFJ      Partial                                      Acute          +---------+---------------+---------+-----------+----------+--------------+ FV Prox  Partial        Yes  FV Mid   Full                                                        +---------+---------------+---------+-----------+----------+--------------+ FV DistalFull                                                        +---------+---------------+---------+-----------+----------+--------------+ PFV      Partial        Yes      Yes                  Acute          +---------+---------------+---------+-----------+----------+--------------+ POP      Full           Yes      Yes                                 +---------+---------------+---------+-----------+----------+--------------+ PTV      Full                                                        +---------+---------------+---------+-----------+----------+--------------+ PERO     Full                                                        +---------+---------------+---------+-----------+----------+--------------+ EIV                     Yes      Yes                                 +---------+---------------+---------+-----------+----------+--------------+     Summary: RIGHT: - Findings consistent with acute deep vein thrombosis involving the right common femoral vein, right femoral vein, and right popliteal vein.  - No cystic structure found in the popliteal fossa. - The external iliac vein appears patent.  LEFT: - Findings consistent with acute deep vein thrombosis involving the left common femoral vein, and left proximal profunda vein.  - No cystic structure found in the popliteal fossa. - The external iliac vein appears patent.  *See table(s) above  for measurements and observations. Electronically signed by Coral Else MD on 03/02/2023 at 11:31:49 AM.    Final    ECHOCARDIOGRAM COMPLETE  Result Date: 03/02/2023    ECHOCARDIOGRAM REPORT   Patient Name:   Hayley Wu Date of Exam: 03/02/2023 Medical Rec #:  [id number]       Height:       63.0 in Accession #:    [id number]      Weight:       147.3 lb Date of Birth:  25-Mar-1951      BSA:  Specialty: Oncology Why: Follow-up in 2 to 3 weeks. Contact information: [address] [place] [address] [phone]              Contact information for after-discharge care     Destination     [facility] Preferred SNF .   Service: Skilled Nursing Contact information: [address] [place] [address] [phone]                      The results of significant diagnostics from this hospitalization (including imaging, microbiology, ancillary and laboratory) are listed below for reference.    Significant Diagnostic Studies: DG CHEST PORT 1 VIEW  Result Date: 03/17/2023 CLINICAL DATA:  Respiratory distress. EXAM: PORTABLE CHEST 1 VIEW COMPARISON:  03/06/2023 FINDINGS: Right chest wall port a catheter noted with tip at the superior cavoatrial junction. Stable cardiomediastinal contours. Increased lung volumes compared with the previous exam. Bilateral increase interstitial markings are similar to the previous exam without focal consolidative change. Chronic interstitial changes of COPD/emphysema. IMPRESSION: 1. Increased lung volumes compared with the previous exam. 2. Persistent increased interstitial  markings within both lungs which may reflect sequelae of atypical inflammation/infection versus interstitial edema. Electronically Signed   By: Signa Kell M.D.   On: 03/17/2023 10:40   DG Chest Port 1 View  Result Date: 03/06/2023 CLINICAL DATA:  Acute respiratory failure. EXAM: PORTABLE CHEST 1 VIEW COMPARISON:  March 02, 2023 FINDINGS: Stable cardiomediastinal silhouette. Right internal jugular Port-A-Cath is unchanged. Decreased bilateral lung opacities are noted suggesting improving pneumonia or edema. Bony thorax is unremarkable. IMPRESSION: Decreased bilateral lung opacities are noted consistent with improving pneumonia or edema. Electronically Signed   By: Lupita Raider M.D.   On: 03/06/2023 09:15   VAS [place] LOWER EXTREMITY VENOUS (DVT)  Result Date: 03/02/2023  Lower Venous DVT Study Patient Name:  Hayley Wu  Date of Exam:   03/02/2023 Medical Rec #: [id number]        Accession #:    [id number] Date of Birth: [birth year]       Patient Gender: F Patient Age:   89 years Exam Location:  [facility] Procedure:      VAS [place] LOWER EXTREMITY VENOUS (DVT) Referring Phys: DAVID ORTIZ --------------------------------------------------------------------------------  Indications: Edema.  Risk Factors: Cancer. Limitations: Poor ultrasound/tissue interface. Comparison Study: No prior studies. Performing Technologist: Chanda Busing RVT  Examination Guidelines: A complete evaluation includes B-mode imaging, spectral Doppler, color Doppler, and power Doppler as needed of all accessible portions of each vessel. Bilateral testing is considered an integral part of a complete examination. Limited examinations for reoccurring indications may be performed as noted. The reflux portion of the exam is performed with the patient in reverse Trendelenburg.  +---------+---------------+---------+-----------+----------+--------------+ RIGHT    CompressibilityPhasicitySpontaneityPropertiesThrombus Aging  +---------+---------------+---------+-----------+----------+--------------+ CFV      Partial        Yes      Yes                  Acute          +---------+---------------+---------+-----------+----------+--------------+ SFJ      Partial                                      Acute          +---------+---------------+---------+-----------+----------+--------------+ FV Prox  Partial        Yes  FV Mid   Full                                                        +---------+---------------+---------+-----------+----------+--------------+ FV DistalFull                                                        +---------+---------------+---------+-----------+----------+--------------+ PFV      Partial        Yes      Yes                  Acute          +---------+---------------+---------+-----------+----------+--------------+ POP      Full           Yes      Yes                                 +---------+---------------+---------+-----------+----------+--------------+ PTV      Full                                                        +---------+---------------+---------+-----------+----------+--------------+ PERO     Full                                                        +---------+---------------+---------+-----------+----------+--------------+ EIV                     Yes      Yes                                 +---------+---------------+---------+-----------+----------+--------------+     Summary: RIGHT: - Findings consistent with acute deep vein thrombosis involving the right common femoral vein, right femoral vein, and right popliteal vein.  - No cystic structure found in the popliteal fossa. - The external iliac vein appears patent.  LEFT: - Findings consistent with acute deep vein thrombosis involving the left common femoral vein, and left proximal profunda vein.  - No cystic structure found in the popliteal fossa. - The external iliac vein appears patent.  *See table(s) above  for measurements and observations. Electronically signed by Coral Else MD on 03/02/2023 at 11:31:49 AM.    Final    ECHOCARDIOGRAM COMPLETE  Result Date: 03/02/2023    ECHOCARDIOGRAM REPORT   Patient Name:   Hayley Wu Date of Exam: 03/02/2023 Medical Rec #:  [id number]       Height:       63.0 in Accession #:    [id number]      Weight:       147.3 lb Date of Birth:  25-Mar-1951      BSA:  FV Mid   Full                                                        +---------+---------------+---------+-----------+----------+--------------+ FV DistalFull                                                        +---------+---------------+---------+-----------+----------+--------------+ PFV      Partial        Yes      Yes                  Acute          +---------+---------------+---------+-----------+----------+--------------+ POP      Full           Yes      Yes                                 +---------+---------------+---------+-----------+----------+--------------+ PTV      Full                                                        +---------+---------------+---------+-----------+----------+--------------+ PERO     Full                                                        +---------+---------------+---------+-----------+----------+--------------+ EIV                     Yes      Yes                                 +---------+---------------+---------+-----------+----------+--------------+     Summary: RIGHT: - Findings consistent with acute deep vein thrombosis involving the right common femoral vein, right femoral vein, and right popliteal vein.  - No cystic structure found in the popliteal fossa. - The external iliac vein appears patent.  LEFT: - Findings consistent with acute deep vein thrombosis involving the left common femoral vein, and left proximal profunda vein.  - No cystic structure found in the popliteal fossa. - The external iliac vein appears patent.  *See table(s) above  for measurements and observations. Electronically signed by Coral Else MD on 03/02/2023 at 11:31:49 AM.    Final    ECHOCARDIOGRAM COMPLETE  Result Date: 03/02/2023    ECHOCARDIOGRAM REPORT   Patient Name:   Hayley Wu Date of Exam: 03/02/2023 Medical Rec #:  [id number]       Height:       63.0 in Accession #:    [id number]      Weight:       147.3 lb Date of Birth:  25-Mar-1951      BSA:  Physician Discharge Summary  Hayley Wu WUJ:[id number] DOB: 08-17-1950 DOA: 03/01/2023  PCP: [name], PA-C  Admit date: 03/01/2023 Discharge date: 03/25/2023  Time spent: 55 minutes  Recommendations for Outpatient Follow-up:  Follow-up with [name], PA-C in 2 weeks.  On follow-up patient will need a basic metabolic profile done to follow-up on electrolytes and renal function.  Patient will need a CBC done to follow-up on counts.  Patient will need ambulatory sats down on follow-up.  Patient will need follow-up on hypothyroidism and repeat TFTs done Follow-up with Dr. Si Gaul, oncology in 2 weeks.   Discharge Diagnoses:  Principal Problem:   Acute respiratory failure with hypoxia (HCC) Active Problems:   Hypokalemia   Hypertension   Hypothyroidism   Adenocarcinoma of right lung, stage 4 (HCC)   Constipation   Normocytic anemia   Moderate protein malnutrition (HCC)   Occlusion of left pulmonary artery (HCC)   Cardiomegaly   Pleural effusion due to CHF (congestive heart failure) (HCC)   Acute deep vein thrombosis (DVT) of femoral vein of both lower extremities (HCC)   Discharge Condition: Stable and improved.  Diet recommendation: Regular  Filed Weights   03/01/23 0844 03/01/23 2000 03/11/23 1318  Weight: 67.1 kg 66.8 kg 70.4 kg    History of present illness:  HPI per Dr. Nilsa Nutting is a 72 y.o. 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  When EMS initially evaluated her she was saturating 50% on room air.  The patient became more alert after supplemental oxygen was provided.  She has been coughing which is occasionally productive, occasional mild wheezing, no  pleuritic chest pain, no URI symptoms or sick contacts exposure. She was recently prescribed apixaban (has not started), advised about symptoms of DVT/PE and sent for bilateral lower extremity Doppler, which she has not obtained yet.  Her appetite is decreased and was recently prescribed mirtazapine 30 mg at bedtime. No chest pain, palpitations, diaphoresis, PND, orthopnea or current pitting edema of the lower extremities. No abdominal pain, nausea, emesis, diarrhea,  melena or hematochezia, but has been constipated for a few days. No flank pain, dysuria, frequency or hematuria. No polyuria, polydipsia, polyphagia or blurred vision.    Lab work: Her CBC showed a white count of 7.5, hemoglobin 10.8 g/dL and platelets 956.  PT was 15.3, INR 1.2 and PTT 26.  Negative coronavirus, influenza and RSV PCR.  CMP showed normal renal function, a potassium of 3.0 mmol/L, the rest of the electrolytes are normal after calcium correction.  LFTs with a total protein of 6.0 and albumin of 2.8 g/dL, but otherwise normal.   Imaging: Portable 1 view chest radiograph showing new bilateral opacities, right greater than left, concerning for pneumonia.  CTA chest showed a stable occluded left lower lobe pulmonary artery branch signs 12/22/2022 with no acute pulmonary same.  There are interval changes of acute CHF with interval cardiomegaly, interstitial pulmonary edema and minimal right pleural effusion.  CAD and aortic atherosclerosis.  Please see full radiology report for further details.   ED course: Initial vital signs were temperature 99.9 F, pulse 100, respiration 18, BP 106/62 mmHg and O2 sat 54% on room air.  The patient received NRB oxygen 10 L, azithromycin 500 mg IVP, ceftriaxone 1 g IVP

## 2023-03-25 NOTE — TOC Transition Note (Signed)
Transition of Care [facility]) - CM/SW Discharge Note   Patient Details  Name: Hayley Wu MRN: [id number] Date of Birth: 15-Mar-1951  Transition of [facility]) CM/SW Contact:  Amada Jupiter, LCSW Phone Number: 03/25/2023, 11:26 AM   Clinical Narrative:     Pt medically cleared for dc home today with [facility] follow up services - all set up with [facility] per pt request.  O2 portable tank has been delivered to room with concentrator to go to the home.  Pt and daughter agreeable with arrangements.  No further TOC needs.  Final next level of care: Home w Home Health Services Barriers to Discharge: Barriers Resolved   Patient Goals and CMS Choice      Discharge Placement                         Discharge Plan and Services Additional resources added to the After Visit Summary for                  DME Arranged: Oxygen DME Agency: Patsy Lager Date DME Agency Contacted: 03/24/23   Representative spoke with at DME Agency: Morrie Sheldon HH Arranged: PT, OT, RN, Nurse's Aide, Social Work [employer] Agency: [facility] Date [facility] Agency Contacted: 03/24/23   Representative spoke with at [facility] Agency: Amy  Social Determinants of Health (SDOH) Interventions SDOH Screenings   Food Insecurity: No Food Insecurity (03/01/2023)  Housing: Low Risk  (03/01/2023)  Transportation Needs: No Transportation Needs (03/01/2023)  Utilities: Not At Risk (03/01/2023)  Depression (PHQ2-9): Low Risk  (10/02/2022)  Tobacco Use: High Risk (03/01/2023)     Readmission Risk Interventions    03/04/2023    6:23 PM  Readmission Risk Prevention Plan  Transportation Screening Complete  PCP or Specialist Appt within 3-5 Days Complete  HRI or Home Care Consult Complete  Social Work Consult for Recovery Care Planning/Counseling Complete  Palliative Care Screening Not Applicable  Medication Review [employer]) Complete

## 2023-03-27 DIAGNOSIS — E440 Moderate protein-calorie malnutrition: Secondary | ICD-10-CM | POA: Diagnosis not present

## 2023-03-27 DIAGNOSIS — I110 Hypertensive heart disease with heart failure: Secondary | ICD-10-CM | POA: Diagnosis not present

## 2023-03-27 DIAGNOSIS — J9601 Acute respiratory failure with hypoxia: Secondary | ICD-10-CM | POA: Diagnosis not present

## 2023-03-27 DIAGNOSIS — G9520 Unspecified cord compression: Secondary | ICD-10-CM | POA: Diagnosis not present

## 2023-03-27 DIAGNOSIS — I509 Heart failure, unspecified: Secondary | ICD-10-CM | POA: Diagnosis not present

## 2023-03-27 DIAGNOSIS — R54 Age-related physical debility: Secondary | ICD-10-CM | POA: Diagnosis not present

## 2023-03-27 DIAGNOSIS — C3490 Malignant neoplasm of unspecified part of unspecified bronchus or lung: Secondary | ICD-10-CM | POA: Diagnosis not present

## 2023-03-27 DIAGNOSIS — I517 Cardiomegaly: Secondary | ICD-10-CM | POA: Diagnosis not present

## 2023-03-27 DIAGNOSIS — E876 Hypokalemia: Secondary | ICD-10-CM | POA: Diagnosis not present

## 2023-03-27 DIAGNOSIS — D649 Anemia, unspecified: Secondary | ICD-10-CM | POA: Diagnosis not present

## 2023-03-27 DIAGNOSIS — I288 Other diseases of pulmonary vessels: Secondary | ICD-10-CM | POA: Diagnosis not present

## 2023-03-27 DIAGNOSIS — Z7901 Long term (current) use of anticoagulants: Secondary | ICD-10-CM | POA: Diagnosis not present

## 2023-03-27 DIAGNOSIS — I82403 Acute embolism and thrombosis of unspecified deep veins of lower extremity, bilateral: Secondary | ICD-10-CM | POA: Diagnosis not present

## 2023-03-27 DIAGNOSIS — Z9981 Dependence on supplemental oxygen: Secondary | ICD-10-CM | POA: Diagnosis not present

## 2023-04-03 DIAGNOSIS — E039 Hypothyroidism, unspecified: Secondary | ICD-10-CM | POA: Diagnosis not present

## 2023-04-03 DIAGNOSIS — J9611 Chronic respiratory failure with hypoxia: Secondary | ICD-10-CM | POA: Diagnosis not present

## 2023-04-03 DIAGNOSIS — C7951 Secondary malignant neoplasm of bone: Secondary | ICD-10-CM | POA: Diagnosis not present

## 2023-04-03 DIAGNOSIS — I10 Essential (primary) hypertension: Secondary | ICD-10-CM | POA: Diagnosis not present

## 2023-04-03 DIAGNOSIS — C3491 Malignant neoplasm of unspecified part of right bronchus or lung: Secondary | ICD-10-CM | POA: Diagnosis not present

## 2023-04-03 DIAGNOSIS — I2699 Other pulmonary embolism without acute cor pulmonale: Secondary | ICD-10-CM | POA: Diagnosis not present

## 2023-04-03 DIAGNOSIS — Z9989 Dependence on other enabling machines and devices: Secondary | ICD-10-CM | POA: Diagnosis not present

## 2023-04-03 DIAGNOSIS — I82413 Acute embolism and thrombosis of femoral vein, bilateral: Secondary | ICD-10-CM | POA: Diagnosis not present

## 2023-04-04 ENCOUNTER — Other Ambulatory Visit: Payer: Self-pay | Admitting: Physician Assistant

## 2023-04-04 DIAGNOSIS — C3491 Malignant neoplasm of unspecified part of right bronchus or lung: Secondary | ICD-10-CM

## 2023-04-07 ENCOUNTER — Other Ambulatory Visit: Payer: Self-pay

## 2023-04-07 ENCOUNTER — Encounter: Payer: Self-pay | Admitting: Internal Medicine

## 2023-04-14 ENCOUNTER — Inpatient Hospital Stay: Payer: PPO

## 2023-04-14 ENCOUNTER — Inpatient Hospital Stay: Payer: PPO | Attending: Internal Medicine

## 2023-04-14 ENCOUNTER — Inpatient Hospital Stay (HOSPITAL_BASED_OUTPATIENT_CLINIC_OR_DEPARTMENT_OTHER): Payer: PPO | Admitting: Internal Medicine

## 2023-04-14 ENCOUNTER — Encounter: Payer: Self-pay | Admitting: Internal Medicine

## 2023-04-14 DIAGNOSIS — I10 Essential (primary) hypertension: Secondary | ICD-10-CM | POA: Insufficient documentation

## 2023-04-14 DIAGNOSIS — C787 Secondary malignant neoplasm of liver and intrahepatic bile duct: Secondary | ICD-10-CM | POA: Diagnosis not present

## 2023-04-14 DIAGNOSIS — Z5111 Encounter for antineoplastic chemotherapy: Secondary | ICD-10-CM | POA: Diagnosis not present

## 2023-04-14 DIAGNOSIS — C3431 Malignant neoplasm of lower lobe, right bronchus or lung: Secondary | ICD-10-CM | POA: Insufficient documentation

## 2023-04-14 DIAGNOSIS — C7951 Secondary malignant neoplasm of bone: Secondary | ICD-10-CM | POA: Diagnosis not present

## 2023-04-14 DIAGNOSIS — Z9981 Dependence on supplemental oxygen: Secondary | ICD-10-CM | POA: Insufficient documentation

## 2023-04-14 DIAGNOSIS — C3491 Malignant neoplasm of unspecified part of right bronchus or lung: Secondary | ICD-10-CM

## 2023-04-14 DIAGNOSIS — Z95828 Presence of other vascular implants and grafts: Secondary | ICD-10-CM

## 2023-04-14 DIAGNOSIS — J704 Drug-induced interstitial lung disorders, unspecified: Secondary | ICD-10-CM | POA: Diagnosis not present

## 2023-04-14 LAB — CBC WITH DIFFERENTIAL (CANCER CENTER ONLY)
Abs Immature Granulocytes: 0.02 10*3/uL (ref 0.00–0.07)
Basophils Absolute: 0 10*3/uL (ref 0.0–0.1)
Basophils Relative: 1 %
Eosinophils Absolute: 0.1 10*3/uL (ref 0.0–0.5)
Eosinophils Relative: 1 %
HCT: 41.7 % (ref 36.0–46.0)
Hemoglobin: 13.5 g/dL (ref 12.0–15.0)
Immature Granulocytes: 1 %
Lymphocytes Relative: 15 %
Lymphs Abs: 0.6 10*3/uL — ABNORMAL LOW (ref 0.7–4.0)
MCH: 29.8 pg (ref 26.0–34.0)
MCHC: 32.4 g/dL (ref 30.0–36.0)
MCV: 92.1 fL (ref 80.0–100.0)
Monocytes Absolute: 0.3 10*3/uL (ref 0.1–1.0)
Monocytes Relative: 8 %
Neutro Abs: 3.1 10*3/uL (ref 1.7–7.7)
Neutrophils Relative %: 74 %
Platelet Count: 178 10*3/uL (ref 150–400)
RBC: 4.53 MIL/uL (ref 3.87–5.11)
RDW: 13.6 % (ref 11.5–15.5)
WBC Count: 4.2 10*3/uL (ref 4.0–10.5)
nRBC: 0 % (ref 0.0–0.2)

## 2023-04-14 LAB — CMP (CANCER CENTER ONLY)
ALT: 9 U/L (ref 0–44)
AST: 14 U/L — ABNORMAL LOW (ref 15–41)
Albumin: 4.1 g/dL (ref 3.5–5.0)
Alkaline Phosphatase: 98 U/L (ref 38–126)
Anion gap: 5 (ref 5–15)
BUN: 12 mg/dL (ref 8–23)
CO2: 31 mmol/L (ref 22–32)
Calcium: 9.3 mg/dL (ref 8.9–10.3)
Chloride: 105 mmol/L (ref 98–111)
Creatinine: 0.53 mg/dL (ref 0.44–1.00)
GFR, Estimated: >60 mL/min (ref >60)
Glucose, Bld: 101 mg/dL — ABNORMAL HIGH (ref 70–99)
Potassium: 3.2 mmol/L — ABNORMAL LOW (ref 3.5–5.1)
Sodium: 141 mmol/L (ref 135–145)
Total Bilirubin: 0.5 mg/dL (ref <1.2)
Total Protein: 6.9 g/dL (ref 6.5–8.1)

## 2023-04-14 MED ORDER — SODIUM CHLORIDE 0.9% FLUSH
10.0000 mL | Freq: Once | INTRAVENOUS | Status: AC
  Administered 2023-04-14: 10 mL

## 2023-04-14 MED ORDER — PROCHLORPERAZINE MALEATE 10 MG PO TABS
10.0000 mg | ORAL_TABLET | Freq: Once | ORAL | Status: AC
Start: 2023-04-14 — End: 2023-04-14
  Administered 2023-04-14: 10 mg via ORAL
  Filled 2023-04-14: qty 1

## 2023-04-14 MED ORDER — PEMETREXED DISODIUM CHEMO INJECTION 500 MG
500.0000 mg/m2 | Freq: Once | INTRAVENOUS | Status: AC
  Administered 2023-04-14: 900 mg via INTRAVENOUS
  Filled 2023-04-14: qty 20

## 2023-04-14 MED ORDER — DEXAMETHASONE SODIUM PHOSPHATE 10 MG/ML IJ SOLN
10.0000 mg | Freq: Once | INTRAMUSCULAR | Status: AC
  Administered 2023-04-14: 10 mg via INTRAVENOUS
  Filled 2023-04-14: qty 1

## 2023-04-14 MED ORDER — SODIUM CHLORIDE 0.9 % IV SOLN: Freq: Once | INTRAVENOUS | Status: AC

## 2023-04-14 NOTE — Addendum Note (Signed)
Addended by: Charma Igo on: 04/14/2023 11:41 AM   Modules accepted: Orders

## 2023-04-14 NOTE — Patient Instructions (Signed)
[place] [facility] [place] [facility]  Discharge Instructions: Thank you for choosing [facility] to provide your oncology and hematology care.   If you have a lab appointment with the Cancer Center, please go directly to the Cancer Center and check in at the registration area.   Wear comfortable clothing and clothing appropriate for easy access to any Portacath or PICC line.   We strive to give you quality time with your provider. You may need to reschedule your appointment if you arrive late (15 or more minutes).  Arriving late affects you and other patients whose appointments are after yours.  Also, if you miss three or more appointments without notifying the office, you may be dismissed from the clinic at the provider's discretion.      For prescription refill requests, have your pharmacy contact our office and allow 72 hours for refills to be completed.    Today you received the following chemotherapy and/or immunotherapy agents: Alimta.       To help prevent nausea and vomiting after your treatment, we encourage you to take your nausea medication as directed.  BELOW ARE SYMPTOMS THAT SHOULD BE REPORTED IMMEDIATELY: *FEVER GREATER THAN 100.4 F (38 C) OR HIGHER *CHILLS OR SWEATING *NAUSEA AND VOMITING THAT IS NOT CONTROLLED WITH YOUR NAUSEA MEDICATION *UNUSUAL SHORTNESS OF BREATH *UNUSUAL BRUISING OR BLEEDING *URINARY PROBLEMS (pain or burning when urinating, or frequent urination) *BOWEL PROBLEMS (unusual diarrhea, constipation, pain near the anus) TENDERNESS IN MOUTH AND THROAT WITH OR WITHOUT PRESENCE OF ULCERS (sore throat, sores in mouth, or a toothache) UNUSUAL RASH, SWELLING OR PAIN  UNUSUAL VAGINAL DISCHARGE OR ITCHING   Items with * indicate a potential emergency and should be followed up as soon as possible or go to the Emergency Department if any problems should occur.  Please show the CHEMOTHERAPY ALERT CARD or IMMUNOTHERAPY ALERT CARD at  check-in to the Emergency Department and triage nurse.  Should you have questions after your visit or need to cancel or reschedule your appointment, please contact [facility]  Dept: [phone]  and follow the prompts.  Office hours are 8:00 a.m. to 4:30 p.m. Monday - Friday. Please note that voicemails left after 4:00 p.m. may not be returned until the following business day.  We are closed weekends and major holidays. You have access to a nurse at all times for urgent questions. Please call the main number to the clinic Dept: [phone] and follow the prompts.   For any non-urgent questions, you may also contact your provider using MyChart. We now offer e-Visits for anyone 18 and older to request care online for non-urgent symptoms. For details visit [email or web address].   Also download the MyChart app! Go to the app store, search "MyChart", open the app, select [place], and log in with your MyChart username and password.   

## 2023-04-14 NOTE — Progress Notes (Signed)
Ok to treat with elevated Hr today per Dr. Arbutus Ped. Patient only receiving alimta at this time.

## 2023-04-14 NOTE — Progress Notes (Signed)
[facility] [phone]   [phone]  OFFICE PROGRESS NOTE  [name], PA-C [address] [place] [address]  DIAGNOSIS: Stage IV (T1b, N2, M1 C) non-small cell lung cancer, adenocarcinoma presented with right lower lobe lung nodule in addition to small bilateral pulmonary nodules and right hilar and mediastinal lymphadenopathy in addition to liver and bone metastasis diagnosed in April 2024. 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   PDL1: 20%   Guardant 360: no actionable mutations.    PRIOR THERAPY: 1) Decompressive thoracic laminectomy of the T7 with spinal cord decompression and biopsy of the epidural mass in April 2024.  2) Palliative radiation to the bone under the care of Dr. Basilio Cairo. Last day scheduled for 10/25/22    CURRENT THERAPY: Carboplatin for an AUC of 5, Alimta 500 mg/m, Keytruda 200 mg IV every 3 weeks.  First dose expected on 10/28/2022. Status post 6 cycles.  Starting from cycle #5 the patient is on maintenance treatment with Alimta and Keytruda every 3 weeks.  Starting from cycle #7 the patient will be treated with single agent Alimta 500 Mg/M2 every 3 weeks.  Rande Lawman was discontinued secondary to suspicious immunotherapy mediated pneumonitis.  INTERVAL HISTORY: Hayley Wu 72 y.o. female returns to the clinic today for follow-up visit.Discussed the use of AI scribe software for clinical note transcription with the patient, who gave verbal consent to proceed.  History of Present Illness   The patient, a 72 year old with a history of lung cancer, was admitted to the hospital in April 2024. She underwent a laminectomy at T7, followed by radiation in the same area. She was then started on chemotherapy with carboplatin, Alimta, and Keytruda for four years. after cycle #4, she began taking only Alimta and  Keytruda weekly, with four more years of this regimen planned.  Approximately 6 weeks ago, she was admitted to the hospital due to a severe illness. Following a chemotherapy session, she felt fine during the first week but started feeling unwell in the second week. She experienced loss of appetite and general malaise, which escalated to the point where she was unaware of her deteriorating condition. She was found to be in near acute respiratory failure and was admitted to the ICU. A scan at that time showed suspicious pneumonia or immunotherapy pneumonitis. She was treated with antibiotics and high-dose steroids, which led to an improvement in her condition.  Following discharge from the hospital, she returned home. She was denied rehabilitation by her insurance. She was discharged from the hospital approximately three weeks ago. She was sent home with oxygen, which she uses at half a liter as needed. She denies any recent falls, vision changes, or old fractures.  The patient also has a history of hypertension. She was taken off all her blood pressure medications during her hospital stay. However, her primary care doctor restarted her on 5mg  of Norvasc, down from 10mg , and increased her Levothyroxine from 100 to 112 micrograms. She is no longer taking losartan.       MEDICAL HISTORY: Past Medical History:  Diagnosis Date   Allergy    Contact lens/glasses fitting    Hypertension     ALLERGIES:  is allergic to erythromycin, amoxicillin, latex, and prednisone.  MEDICATIONS:  Current Outpatient Medications  Medication Sig Dispense Refill   acetaminophen (TYLENOL) 500 MG tablet Take  500 mg by mouth every 6 (six) hours as needed for moderate pain.     amLODipine (NORVASC) 10 MG tablet Take 10 mg by mouth daily.     apixaban (ELIQUIS) 5 MG TABS tablet Take 1 tablet (5 mg total) by mouth 2 (two) times daily. 60 tablet 2   folic acid (FOLVITE) 1 MG tablet Take 1 tablet by mouth once daily 30 tablet  0   Ipratropium-Albuterol (COMBIVENT) 20-100 MCG/ACT AERS respimat Inhale 1 puff into the lungs every 6 (six) hours as needed for wheezing or shortness of breath. 4 g 1   levothyroxine (SYNTHROID) 100 MCG tablet Take 1 tablet (100 mcg total) by mouth daily at 6 (six) AM. 30 tablet 1   lidocaine-prilocaine (EMLA) cream Apply 1 Application topically as needed. 30 g 2   losartan (COZAAR) 100 MG tablet Take 100 mg by mouth daily.     mirtazapine (REMERON) 30 MG tablet Take 1 tablet (30 mg total) by mouth at bedtime. 30 tablet 2   prochlorperazine (COMPAZINE) 10 MG tablet Take 1 tablet (10 mg total) by mouth every 6 (six) hours as needed. (Patient taking differently: Take 10 mg by mouth every 6 (six) hours as needed for refractory nausea / vomiting.) 30 tablet 2   senna (SENOKOT) 8.6 MG TABS tablet Take 1 tablet (8.6 mg total) by mouth daily as needed for mild constipation.     VITAMIN D PO Take 1 tablet by mouth daily.     No current facility-administered medications for this visit.    SURGICAL HISTORY:  Past Surgical History:  Procedure Laterality Date   ABDOMINAL HYSTERECTOMY     BREAST SURGERY  2010   rt lumpectomy-neg   IR IMAGING GUIDED PORT INSERTION  12/18/2022   LAMINECTOMY N/A 09/21/2022   Procedure: THORACIC LAMINECTOMY FOR TUMOR;  Surgeon: Tia Alert, MD;  Location[facility];  Service: Neurosurgery;  Laterality: N/A;   OPEN REDUCTION INTERNAL FIXATION (ORIF) DISTAL RADIAL FRACTURE Left 11/26/2013   Procedure: OPEN REDUCTION INTERNAL FIXATION (ORIF) LEFT  DISTAL RADIUS ;  Surgeon: Nestor Lewandowsky, MD;  Location: [facility];  Service: Orthopedics;  Laterality: Left;  with block   TUBAL LIGATION      REVIEW OF SYSTEMS:  Constitutional: positive for fatigue Eyes: negative Ears, nose, mouth, throat, and face: negative Respiratory: positive for dyspnea on exertion Cardiovascular: negative Gastrointestinal: negative Genitourinary:negative Integument/breast:  negative Hematologic/lymphatic: negative Musculoskeletal:negative Neurological: negative Behavioral/Psych: negative Endocrine: negative Allergic/Immunologic: negative   PHYSICAL EXAMINATION: General appearance: alert, cooperative, and no distress Head: Normocephalic, without obvious abnormality, atraumatic Neck: no adenopathy, no JVD, supple, symmetrical, trachea midline, and thyroid not enlarged, symmetric, no tenderness/mass/nodules Lymph nodes: Cervical, supraclavicular, and axillary nodes normal. Resp: clear to auscultation bilaterally Back: symmetric, no curvature. ROM normal. No CVA tenderness. Cardio: regular rate and rhythm, S1, S2 normal, no murmur, click, rub or gallop GI: soft, non-tender; bowel sounds normal; no masses,  no organomegaly Extremities: extremities normal, atraumatic, no cyanosis or edema Neurologic: Alert and oriented X 3, normal strength and tone. Normal symmetric reflexes. Normal coordination and gait  ECOG PERFORMANCE STATUS: 1 - Symptomatic but completely ambulatory  Blood pressure 139/88, pulse (!) 109, temperature 97.9 F (36.6 C), temperature source Oral, resp. rate 17, height 5\' 3"  (1.6 m), weight 151 lb 1.6 oz (68.5 kg), SpO2 95%.  LABORATORY DATA: Lab Results  Component Value Date   WBC 4.2 04/14/2023   HGB 13.5 04/14/2023   HCT 41.7 04/14/2023   MCV 92.1  04/14/2023   PLT 178 04/14/2023      Chemistry      Component Value Date/Time   NA 138 03/25/2023 0237   K 3.6 03/25/2023 0237   CL 104 03/25/2023 0237   CO2 27 03/25/2023 0237   BUN 21 03/25/2023 0237   CREATININE 0.48 03/25/2023 0237   CREATININE 0.56 02/11/2023 0929      Component Value Date/Time   CALCIUM 8.1 (L) 03/25/2023 0237   ALKPHOS 59 03/02/2023 0455   AST 17 03/02/2023 0455   AST 16 02/11/2023 0929   ALT 13 03/02/2023 0455   ALT 13 02/11/2023 0929   BILITOT 1.0 03/02/2023 0455   BILITOT 0.5 02/11/2023 0929       RADIOGRAPHIC STUDIES: DG CHEST PORT 1  VIEW  Result Date: 03/17/2023 CLINICAL DATA:  Respiratory distress. EXAM: PORTABLE CHEST 1 VIEW COMPARISON:  03/06/2023 FINDINGS: Right chest wall port a catheter noted with tip at the superior cavoatrial junction. Stable cardiomediastinal contours. Increased lung volumes compared with the previous exam. Bilateral increase interstitial markings are similar to the previous exam without focal consolidative change. Chronic interstitial changes of COPD/emphysema. IMPRESSION: 1. Increased lung volumes compared with the previous exam. 2. Persistent increased interstitial markings within both lungs which may reflect sequelae of atypical inflammation/infection versus interstitial edema. Electronically Signed   By: Signa Kell M.D.   On: 03/17/2023 10:40    ASSESSMENT AND PLAN: This is a very pleasant 72 years old white female with Stage IV (T1b, N2, M1 C) non-small cell lung cancer, adenocarcinoma presented with right lower lobe lung nodule in addition to small bilateral pulmonary nodules and right hilar and mediastinal lymphadenopathy in addition to liver and bone metastasis diagnosed in April 2024. 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 Molecular studies showed no actionable mutations and PD-L1 expression of 20%. 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  She also had palliative radiation to the bone under the care of Dr. Basilio Cairo. Last day scheduled for 10/25/22. She is currently on palliative systemic chemotherapy with carboplatin for AUC of 5, Alimta 500 Mg/M2 and Keytruda 200 Mg IV every 3 weeks status post 6 cycles.  Starting from cycle #5 the patient is on maintenance treatment with Alimta and Keytruda every 3 weeks.  After cycle #6 Keytruda was discontinued secondary to suspicious immunotherapy  mediated pneumonitis with recent hospitalization and acute respiratory failure.    Lung Cancer PD-L1 20% positive, treated with T7 decompressive laminectomy, radiation, and chemotherapy (Carboplatin, Alimta, Keytruda). Currently on maintenance Alimta and Keytruda. -Continue Alimta 500mg /m^2 IV every 3 weeks. -Discontinue Keytruda due to suspicion of causing immune therapy pneumonitis.  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 Treated with antibiotics and high-dose steroids. -Monitor closely for recurrence of symptoms.  Hypertension Recent changes in antihypertensive medications. Currently on Norvasc 5mg  and Levothyroxine . -Monitor blood pressure and adjust medications as needed.  Follow-up in 3 weeks for next round of chemotherapy.   She was advised to call immediately if she has any concerning symptoms in the interval. The patient voices understanding of current disease status and treatment options and is in agreement with the current care plan.  All questions were answered. The patient knows to call the clinic with any problems, questions or concerns. We can certainly see  the patient much sooner if necessary.  The total time spent in the appointment was 30 minutes.  Disclaimer: This note was dictated with voice recognition software. Similar sounding words can inadvertently be transcribed and may not be corrected upon review.

## 2023-04-15 ENCOUNTER — Other Ambulatory Visit (HOSPITAL_COMMUNITY): Payer: Self-pay

## 2023-04-19 ENCOUNTER — Telehealth: Payer: Self-pay | Admitting: Internal Medicine

## 2023-04-19 NOTE — Telephone Encounter (Signed)
Called patient regarding upcoming appointments November/December and January appointments, patient is notified.

## 2023-04-21 ENCOUNTER — Other Ambulatory Visit: Payer: Self-pay

## 2023-04-21 ENCOUNTER — Other Ambulatory Visit (HOSPITAL_COMMUNITY): Payer: Self-pay

## 2023-04-22 ENCOUNTER — Encounter: Payer: Self-pay | Admitting: Internal Medicine

## 2023-04-22 ENCOUNTER — Other Ambulatory Visit (HOSPITAL_COMMUNITY): Payer: Self-pay

## 2023-04-25 DIAGNOSIS — R918 Other nonspecific abnormal finding of lung field: Secondary | ICD-10-CM | POA: Diagnosis not present

## 2023-04-28 ENCOUNTER — Other Ambulatory Visit: Payer: Self-pay | Admitting: Physician Assistant

## 2023-04-28 DIAGNOSIS — C3491 Malignant neoplasm of unspecified part of right bronchus or lung: Secondary | ICD-10-CM

## 2023-05-02 ENCOUNTER — Other Ambulatory Visit (HOSPITAL_COMMUNITY): Payer: Self-pay

## 2023-05-02 NOTE — Progress Notes (Unsigned)
[facility] OFFICE PROGRESS NOTE  Hayley Hatch, PA-C [address] [place] [address]  DIAGNOSIS: Stage IV (T1b, N2, M1 C) non-small cell lung cancer, adenocarcinoma presented with right lower lobe lung nodule in addition to small bilateral pulmonary nodules and right hilar and mediastinal lymphadenopathy in addition to liver and bone metastasis diagnosed in April 2024. 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   PDL1: 20%   Guardant 360: no actionable mutations.   PRIOR THERAPY: 1) Decompressive thoracic laminectomy of the T7 with spinal cord decompression and biopsy of the epidural mass in April 2024.  2) Palliative radiation to the bone under the care of Dr. Basilio Cairo. Last day scheduled for 10/25/22   CURRENT THERAPY: Carboplatin for an AUC of 5, Alimta 500 mg/m, Keytruda 200 mg IV every 3 weeks.  First dose expected on 10/28/2022. Status post 6 cycles.  Starting from cycle #5 the patient is on maintenance treatment with Alimta and Keytruda every 3 weeks.  Starting from cycle #7 the patient will be treated with single agent Alimta 500 Mg/M2 every 3 weeks.  Hayley Wu was discontinued secondary to suspicious immunotherapy mediated pneumonitis.   INTERVAL HISTORY: Hayley Wu 72 y.o. female returns to the clinic today for follow-up visit she was last seen by Dr. Arbutus Ped 3 weeks ago on 04/14/23.  At that point in time, he discontinued her Keytruda due to suspicious pneumonitis.  She is currently on single agent Alimta.  She tolerated her last cycle ***.   Today she denies any fever, chills, or night sweats.  Weight and appetite?  Her breathing is***.  Supplemental oxygen?  Shortness of breath?  Cough?  Denies any hemoptysis.  Denies any nausea, vomiting, diarrhea, or constipation.  Denies any headaches, vision changes, or falls.  Hypertension?  She is here today for  evaluation and repeat blood work before undergoing cycle #8.        MEDICAL HISTORY: Past Medical History:  Diagnosis Date   Allergy    Contact lens/glasses fitting    Hypertension     ALLERGIES:  is allergic to erythromycin, amoxicillin, latex, and prednisone.  MEDICATIONS:  Current Outpatient Medications  Medication Sig Dispense Refill   acetaminophen (TYLENOL) 500 MG tablet Take 500 mg by mouth every 6 (six) hours as needed for moderate pain.     apixaban (ELIQUIS) 5 MG TABS tablet Take 1 tablet (5 mg total) by mouth 2 (two) times daily. 60 tablet 2   folic acid (FOLVITE) 1 MG tablet Take 1 tablet by mouth once daily 30 tablet 0   Ipratropium-Albuterol (COMBIVENT) 20-100 MCG/ACT AERS respimat Inhale 1 puff into the lungs every 6 (six) hours as needed for wheezing or shortness of breath. 4 g 1   levothyroxine (SYNTHROID) 112 MCG tablet Take 112 mcg by mouth daily before breakfast.     lidocaine-prilocaine (EMLA) cream Apply 1 Application topically as needed. 30 g 2   mirtazapine (REMERON) 30 MG tablet Take 1 tablet (30 mg total) by mouth at bedtime. 30 tablet 2   prochlorperazine (COMPAZINE) 10 MG tablet Take 1 tablet (10 mg total) by mouth every 6 (six) hours as needed. (Patient taking differently: Take 10 mg by mouth every 6 (six) hours as needed for refractory nausea / vomiting.) 30 tablet 2   VITAMIN D PO Take 1 tablet by mouth daily.     No current  facility-administered medications for this visit.    SURGICAL HISTORY:  Past Surgical History:  Procedure Laterality Date   ABDOMINAL HYSTERECTOMY     BREAST SURGERY  2010   rt lumpectomy-neg   IR IMAGING GUIDED PORT INSERTION  12/18/2022   LAMINECTOMY N/A 09/21/2022   Procedure: THORACIC LAMINECTOMY FOR TUMOR;  Surgeon: Tia Alert, MD;  Location: [facility] OR;  Service: Neurosurgery;  Laterality: N/A;   OPEN REDUCTION INTERNAL FIXATION (ORIF) DISTAL RADIAL FRACTURE Left 11/26/2013   Procedure: OPEN REDUCTION INTERNAL FIXATION  (ORIF) LEFT  DISTAL RADIUS ;  Surgeon: Nestor Lewandowsky, MD;  Location: [facility];  Service: Orthopedics;  Laterality: Left;  with block   TUBAL LIGATION      REVIEW OF SYSTEMS:   Review of Systems  Constitutional: Negative for appetite change, chills, fatigue, fever and unexpected weight change.  HENT:   Negative for mouth sores, nosebleeds, sore throat and trouble swallowing.   Eyes: Negative for eye problems and icterus.  Respiratory: Negative for cough, hemoptysis, shortness of breath and wheezing.   Cardiovascular: Negative for chest pain and leg swelling.  Gastrointestinal: Negative for abdominal pain, constipation, diarrhea, nausea and vomiting.  Genitourinary: Negative for bladder incontinence, difficulty urinating, dysuria, frequency and hematuria.   Musculoskeletal: Negative for back pain, gait problem, neck pain and neck stiffness.  Skin: Negative for itching and rash.  Neurological: Negative for dizziness, extremity weakness, gait problem, headaches, light-headedness and seizures.  Hematological: Negative for adenopathy. Does not bruise/bleed easily.  Psychiatric/Behavioral: Negative for confusion, depression and sleep disturbance. The patient is not nervous/anxious.     PHYSICAL EXAMINATION:  There were no vitals taken for this visit.  ECOG PERFORMANCE STATUS: {CHL ONC ECOG [id number]  Physical Exam  Constitutional: Oriented to person, place, and time and well-developed, well-nourished, and in no distress. No distress.  HENT:  Head: Normocephalic and atraumatic.  Mouth/Throat: Oropharynx is clear and moist. No oropharyngeal exudate.  Eyes: Conjunctivae are normal. Right eye exhibits no discharge. Left eye exhibits no discharge. No scleral icterus.  Neck: Normal range of motion. Neck supple.  Cardiovascular: Normal rate, regular rhythm, normal heart sounds and intact distal pulses.   Pulmonary/Chest: Effort normal and breath sounds normal. No respiratory  distress. No wheezes. No rales.  Abdominal: Soft. Bowel sounds are normal. Exhibits no distension and no mass. There is no tenderness.  Musculoskeletal: Normal range of motion. Exhibits no edema.  Lymphadenopathy:    No cervical adenopathy.  Neurological: Alert and oriented to person, place, and time. Exhibits normal muscle tone. Gait normal. Coordination normal.  Skin: Skin is warm and dry. No rash noted. Not diaphoretic. No erythema. No pallor.  Psychiatric: Mood, memory and judgment normal.  Vitals reviewed.  LABORATORY DATA: Lab Results  Component Value Date   WBC 4.2 04/14/2023   HGB 13.5 04/14/2023   HCT 41.7 04/14/2023   MCV 92.1 04/14/2023   PLT 178 04/14/2023      Chemistry      Component Value Date/Time   NA 141 04/14/2023 0831   K 3.2 (L) 04/14/2023 0831   CL 105 04/14/2023 0831   CO2 31 04/14/2023 0831   BUN 12 04/14/2023 0831   CREATININE 0.53 04/14/2023 0831      Component Value Date/Time   CALCIUM 9.3 04/14/2023 0831   ALKPHOS 98 04/14/2023 0831   AST 14 (L) 04/14/2023 0831   ALT 9 04/14/2023 0831   BILITOT 0.5 04/14/2023 0831       RADIOGRAPHIC STUDIES:  No  results found.   ASSESSMENT/PLAN:  This is a very pleasant 72 year old Caucasian female recently diagnosed with stage IV (T1b, N2, M1 C) non-small cell lung cancer, adenocarcinoma presented with right lower lobe lung nodule in addition to small bilateral pulmonary nodules and right hilar and mediastinal lymphadenopathy in addition to liver and bone metastasis diagnosed in April 2024. 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 Her PDL1 expression is 20%. She has no actionable mutations.    She is completed palliative radiation to the painful metastatic bone lesions under the care of Dr. Basilio Cairo in the last day radiation was on 10/25/2022.    She is currently undergoing systemic chemotherapy with  carboplatin for an AUC of 5, Alimta 500 mg/m, and Keytruda 200 mg IV every 3 weeks. She is status post 7 cycles.  Starting from cycle #5 she was on maintenance treatment with Alimta and Keytruda.  Starting after cycle #6 Keytruda was discontinued from the care plan due to suspicious immunotherapy mediated pneumonitis.  Therefore, she is currently undergoing single agent chemotherapy with Alimta IV every 3 weeks.  Labs were reviewed. Recommend that she ***proceed with cycle #8 today as scheduled.     We will see her back for follow-up visit in 3 weeks for evaluation and repeat blood work before undergoing cycle #9.     The patient was advised to call immediately if she has any concerning symptoms in the interval. The patient voices understanding of current disease status and treatment options and is in agreement with the current care plan. All questions were answered. The patient knows to call the clinic with any problems, questions or concerns. We can certainly see the patient much sooner if necessary    No orders of the defined types were placed in this encounter.    I spent {CHL ONC TIME VISIT - GLOVF:[id number]} counseling the patient face to face. The total time spent in the appointment was {CHL ONC TIME VISIT - CZYSA:[id number]}.  Donie Moulton L Jesusita Jocelyn, PA-C 05/02/23

## 2023-05-05 ENCOUNTER — Inpatient Hospital Stay: Payer: PPO

## 2023-05-05 ENCOUNTER — Inpatient Hospital Stay (HOSPITAL_BASED_OUTPATIENT_CLINIC_OR_DEPARTMENT_OTHER): Payer: PPO | Admitting: Physician Assistant

## 2023-05-05 VITALS — BP 160/87 | HR 108 | Temp 98.6°F | Resp 17 | Wt 157.7 lb

## 2023-05-05 VITALS — BP 149/82 | HR 102 | Resp 17

## 2023-05-05 DIAGNOSIS — Z95828 Presence of other vascular implants and grafts: Secondary | ICD-10-CM

## 2023-05-05 DIAGNOSIS — E876 Hypokalemia: Secondary | ICD-10-CM

## 2023-05-05 DIAGNOSIS — C3491 Malignant neoplasm of unspecified part of right bronchus or lung: Secondary | ICD-10-CM | POA: Diagnosis not present

## 2023-05-05 DIAGNOSIS — Z5111 Encounter for antineoplastic chemotherapy: Secondary | ICD-10-CM | POA: Diagnosis not present

## 2023-05-05 DIAGNOSIS — C7951 Secondary malignant neoplasm of bone: Secondary | ICD-10-CM

## 2023-05-05 LAB — CBC WITH DIFFERENTIAL (CANCER CENTER ONLY)
Abs Immature Granulocytes: 0.01 10*3/uL (ref 0.00–0.07)
Basophils Absolute: 0 10*3/uL (ref 0.0–0.1)
Basophils Relative: 1 %
Eosinophils Absolute: 0.1 10*3/uL (ref 0.0–0.5)
Eosinophils Relative: 3 %
HCT: 40.5 % (ref 36.0–46.0)
Hemoglobin: 13.1 g/dL (ref 12.0–15.0)
Immature Granulocytes: 0 %
Lymphocytes Relative: 16 %
Lymphs Abs: 0.5 10*3/uL — ABNORMAL LOW (ref 0.7–4.0)
MCH: 29.8 pg (ref 26.0–34.0)
MCHC: 32.3 g/dL (ref 30.0–36.0)
MCV: 92.3 fL (ref 80.0–100.0)
Monocytes Absolute: 0.3 10*3/uL (ref 0.1–1.0)
Monocytes Relative: 10 %
Neutro Abs: 2.3 10*3/uL (ref 1.7–7.7)
Neutrophils Relative %: 70 %
Platelet Count: 176 10*3/uL (ref 150–400)
RBC: 4.39 MIL/uL (ref 3.87–5.11)
RDW: 14.2 % (ref 11.5–15.5)
WBC Count: 3.3 10*3/uL — ABNORMAL LOW (ref 4.0–10.5)
nRBC: 0 % (ref 0.0–0.2)

## 2023-05-05 LAB — CMP (CANCER CENTER ONLY)
ALT: 18 U/L (ref 0–44)
AST: 22 U/L (ref 15–41)
Albumin: 4 g/dL (ref 3.5–5.0)
Alkaline Phosphatase: 90 U/L (ref 38–126)
Anion gap: 7 (ref 5–15)
BUN: 8 mg/dL (ref 8–23)
CO2: 29 mmol/L (ref 22–32)
Calcium: 9.5 mg/dL (ref 8.9–10.3)
Chloride: 106 mmol/L (ref 98–111)
Creatinine: 0.55 mg/dL (ref 0.44–1.00)
GFR, Estimated: >60 mL/min (ref >60)
Glucose, Bld: 111 mg/dL — ABNORMAL HIGH (ref 70–99)
Potassium: 3.2 mmol/L — ABNORMAL LOW (ref 3.5–5.1)
Sodium: 142 mmol/L (ref 135–145)
Total Bilirubin: 0.5 mg/dL (ref <1.2)
Total Protein: 6.8 g/dL (ref 6.5–8.1)

## 2023-05-05 MED ORDER — SODIUM CHLORIDE 0.9 % IV SOLN
Freq: Once | INTRAVENOUS | Status: AC
Start: 2023-05-05 — End: 2023-05-05

## 2023-05-05 MED ORDER — POTASSIUM CHLORIDE CRYS ER 20 MEQ PO TBCR: 20.0000 meq | EXTENDED_RELEASE_TABLET | Freq: Every day | ORAL | 0 refills | Status: DC

## 2023-05-05 MED ORDER — DEXAMETHASONE SODIUM PHOSPHATE 10 MG/ML IJ SOLN
10.0000 mg | Freq: Once | INTRAMUSCULAR | Status: AC
  Administered 2023-05-05: 10 mg via INTRAVENOUS
  Filled 2023-05-05: qty 1

## 2023-05-05 MED ORDER — SODIUM CHLORIDE 0.9% FLUSH
10.0000 mL | INTRAVENOUS | Status: DC | PRN
  Administered 2023-05-05: 10 mL

## 2023-05-05 MED ORDER — PROCHLORPERAZINE MALEATE 10 MG PO TABS
10.0000 mg | ORAL_TABLET | Freq: Once | ORAL | Status: AC
Start: 2023-05-05 — End: 2023-05-05
  Administered 2023-05-05: 10 mg via ORAL
  Filled 2023-05-05: qty 1

## 2023-05-05 MED ORDER — SODIUM CHLORIDE 0.9 % IV SOLN
500.0000 mg/m2 | Freq: Once | INTRAVENOUS | Status: AC
  Administered 2023-05-05: 900 mg via INTRAVENOUS
  Filled 2023-05-05: qty 20

## 2023-05-05 MED ORDER — HEPARIN SOD (PORK) LOCK FLUSH 100 UNIT/ML IV SOLN
500.0000 [IU] | Freq: Once | INTRAVENOUS | Status: AC | PRN
Start: 2023-05-05 — End: 2023-05-05
  Administered 2023-05-05: 500 [IU]

## 2023-05-05 MED ORDER — SODIUM CHLORIDE 0.9% FLUSH
10.0000 mL | Freq: Once | INTRAVENOUS | Status: AC
  Administered 2023-05-05: 10 mL

## 2023-05-05 NOTE — Progress Notes (Signed)
Per Cassie Heilingoetter, PA, OK to treat pulse 102.

## 2023-05-06 DIAGNOSIS — E039 Hypothyroidism, unspecified: Secondary | ICD-10-CM | POA: Diagnosis not present

## 2023-05-06 DIAGNOSIS — C3491 Malignant neoplasm of unspecified part of right bronchus or lung: Secondary | ICD-10-CM | POA: Diagnosis not present

## 2023-05-06 DIAGNOSIS — I10 Essential (primary) hypertension: Secondary | ICD-10-CM | POA: Diagnosis not present

## 2023-05-06 DIAGNOSIS — C7951 Secondary malignant neoplasm of bone: Secondary | ICD-10-CM | POA: Diagnosis not present

## 2023-05-14 ENCOUNTER — Other Ambulatory Visit: Payer: Self-pay

## 2023-05-16 ENCOUNTER — Other Ambulatory Visit: Payer: Self-pay | Admitting: Internal Medicine

## 2023-05-21 NOTE — Progress Notes (Signed)
[facility] OFFICE PROGRESS NOTE  Hayley Hatch, PA-C [address] [place] [address]  DIAGNOSIS: Stage IV (T1b, N2, M1 C) non-small cell lung cancer, adenocarcinoma presented with right lower lobe lung nodule in addition to small bilateral pulmonary nodules and right hilar and mediastinal lymphadenopathy in addition to liver and bone metastasis diagnosed in April 2024. 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   PDL1: 20%   Guardant 360: no actionable mutations.  PRIOR THERAPY: 1) Decompressive thoracic laminectomy of the T7 with spinal cord decompression and biopsy of the epidural mass in April 2024.  2) Palliative radiation to the bone under the care of Dr. Basilio Cairo. Last day scheduled for 10/25/22    CURRENT THERAPY: Carboplatin for an AUC of 5, Alimta 500 mg/m, Keytruda 200 mg IV every 3 weeks.  First dose expected on 10/28/2022. Status post 8 cycles.  Starting from cycle #5 the patient is on maintenance treatment with Alimta and Keytruda every 3 weeks.  Starting from cycle #7 the patient will be treated with single agent Alimta 500 Mg/M2 every 3 weeks.  Hayley Wu was discontinued secondary to suspicious immunotherapy mediated pneumonitis.   INTERVAL HISTORY: Hayley Wu 72 y.o. female returns to the clinic today for a follow-up visit she was last seen by myself 3 weeks ago. She is currently on single agent Alimta. 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   Today she denies any fever, chills, or night sweats. She reports a good appetite.   Her breathing is "fine". She only wears 0.5 of supplemental oxygen at night. She denies any cough. Denies any hemoptysis.  Denies any nausea, vomiting, diarrhea, or constipation.  Denies any headaches, vision changes, or falls.  She is currently undergoing home PT. She is here today for  evaluation and repeat blood work before undergoing cycle #9    MEDICAL HISTORY: Past Medical History:  Diagnosis Date   Allergy    Contact lens/glasses fitting    Hypertension     ALLERGIES:  is allergic to erythromycin, amoxicillin, latex, and prednisone.  MEDICATIONS:  Current Outpatient Medications  Medication Sig Dispense Refill   acetaminophen (TYLENOL) 500 MG tablet Take 500 mg by mouth every 6 (six) hours as needed for moderate pain.     apixaban (ELIQUIS) 5 MG TABS tablet Take 1 tablet (5 mg total) by mouth 2 (two) times daily. 60 tablet 2   folic acid (FOLVITE) 1 MG tablet Take 1 tablet (1 mg total) by mouth daily. 90 tablet 1   Ipratropium-Albuterol (COMBIVENT) 20-100 MCG/ACT AERS respimat Inhale 1 puff into the lungs every 6 (six) hours as needed for wheezing or shortness of breath. 4 g 1   levothyroxine (SYNTHROID) 112 MCG tablet Take 112 mcg by mouth daily before breakfast.     lidocaine-prilocaine (EMLA) cream Apply 1 Application topically as needed. 30 g 2   mirtazapine (REMERON) 30 MG tablet Take 1 tablet (30 mg total) by mouth at bedtime. 90 tablet 0   potassium chloride SA (KLOR-CON M) 20 MEQ tablet Take 1 tablet (20 mEq total) by mouth daily. 6 tablet 0   prochlorperazine (COMPAZINE) 10 MG tablet Take 1 tablet (10 mg total) by mouth every 6 (six) hours as needed. (Patient taking differently: Take 10 mg by mouth every 6 (six) hours as needed for refractory nausea / vomiting.) 30 tablet 2  VITAMIN D PO Take 1 tablet by mouth daily.     No current facility-administered medications for this visit.    SURGICAL HISTORY:  Past Surgical History:  Procedure Laterality Date   ABDOMINAL HYSTERECTOMY     BREAST SURGERY  2010   rt lumpectomy-neg   IR IMAGING GUIDED PORT INSERTION  12/18/2022   LAMINECTOMY N/A 09/21/2022   Procedure: THORACIC LAMINECTOMY FOR TUMOR;  Surgeon: Tia Alert, MD;  Location: [facility] OR;  Service: Neurosurgery;  Laterality: N/A;   OPEN REDUCTION  INTERNAL FIXATION (ORIF) DISTAL RADIAL FRACTURE Left 11/26/2013   Procedure: OPEN REDUCTION INTERNAL FIXATION (ORIF) LEFT  DISTAL RADIUS ;  Surgeon: Nestor Lewandowsky, MD;  Location: [facility];  Service: Orthopedics;  Laterality: Left;  with block   TUBAL LIGATION      REVIEW OF SYSTEMS:   Review of Systems  Constitutional: Negative for appetite change, chills, fatigue, fever and unexpected weight change.  HENT: Negative for mouth sores, nosebleeds, sore throat and trouble swallowing.   Eyes: Negative for eye problems and icterus.  Respiratory: Positive for stable dyspnea on exertion. Negative for cough, hemoptysis, and wheezing.     Cardiovascular: Negative for chest pain and leg swelling.  Gastrointestinal: Negative for abdominal pain, constipation, diarrhea, nausea and vomiting.  Genitourinary: Negative for bladder incontinence, difficulty urinating, dysuria, frequency and hematuria.   Musculoskeletal: Negative for back pain, gait problem, neck pain and neck stiffness.  Skin: Negative for itching and rash.  Neurological: Negative for dizziness, extremity weakness, gait problem, headaches, light-headedness and seizures.  Hematological: Negative for adenopathy. Does not bruise/bleed easily.  Psychiatric/Behavioral: Negative for confusion, depression and sleep disturbance. The patient is not nervous/anxious.     PHYSICAL EXAMINATION:  Blood pressure (!) 144/80, pulse 93, temperature 98.2 F (36.8 C), temperature source Temporal, resp. rate 18, weight 158 lb 8 oz (71.9 kg), SpO2 100%.  ECOG PERFORMANCE STATUS: 1  Physical Exam  Constitutional: Oriented to person, place, and time and well-developed, well-nourished, and in no distress. HENT:  Head: Normocephalic and atraumatic.  Mouth/Throat: Oropharynx is clear and moist. No oropharyngeal exudate.  Eyes: Conjunctivae are normal. Right eye exhibits no discharge. Left eye exhibits no discharge. No scleral icterus.  Neck:  Normal range of motion. Neck supple.  Cardiovascular: Normal rate, regular rhythm, normal heart sounds and intact distal pulses.   Pulmonary/Chest: Effort normal and breath sounds normal. No respiratory distress. No wheezes. No rales.  Abdominal: Soft. Bowel sounds are normal. Exhibits no distension and no mass. There is no tenderness.  Musculoskeletal: Normal range of motion. Stable bilateral lower extremity edema.  Lymphadenopathy:    No cervical adenopathy.  Neurological: Alert and oriented to person, place, and time. Exhibits muscle wasting. Examined in the wheelchair.  Skin: Skin is warm and dry. No rash noted. Not diaphoretic. No erythema. No pallor.  Psychiatric: Mood, memory and judgment normal.  Vitals reviewed.  LABORATORY DATA: Lab Results  Component Value Date   WBC 3.8 (L) 05/26/2023   HGB 13.1 05/26/2023   HCT 40.4 05/26/2023   MCV 92.0 05/26/2023   PLT 183 05/26/2023      Chemistry      Component Value Date/Time   NA 142 05/05/2023 1042   K 3.2 (L) 05/05/2023 1042   CL 106 05/05/2023 1042   CO2 29 05/05/2023 1042   BUN 8 05/05/2023 1042   CREATININE 0.55 05/05/2023 1042      Component Value Date/Time   CALCIUM 9.5 05/05/2023 1042   ALKPHOS  90 05/05/2023 1042   AST 22 05/05/2023 1042   ALT 18 05/05/2023 1042   BILITOT 0.5 05/05/2023 1042       RADIOGRAPHIC STUDIES:  No results found.   ASSESSMENT/PLAN:  This is a very pleasant 72 year old Caucasian female recently diagnosed with stage IV (T1b, N2, M1 C) non-small cell lung cancer, adenocarcinoma presented with right lower lobe lung nodule in addition to small bilateral pulmonary nodules and right hilar and mediastinal lymphadenopathy in addition to liver and bone metastasis diagnosed in April 2024. 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 Her PDL1 expression is 20%. She has no actionable mutations.     She is completed palliative radiation to the painful metastatic bone lesions under the care of Dr. Basilio Cairo in the last day radiation was on 10/25/2022.    She is currently undergoing systemic chemotherapy with carboplatin for an AUC of 5, Alimta 500 mg/m, and Keytruda 200 mg IV every 3 weeks. She is status post 8 cycles.  Starting from cycle #5 she was on maintenance treatment with Alimta and Keytruda.  Starting after cycle #6 Keytruda was discontinued from the care plan due to suspicious immunotherapy mediated pneumonitis.  Therefore, she is currently undergoing single agent chemotherapy with Alimta IV every 3 weeks.  Labs were reviewed. Recommend that she proceed with cycle #9 today as scheduled.    We will see her back for follow-up visit in 3 weeks for evaluation and repeat blood work before undergoing cycle #10.    I will arrange for a restaging CT scan of the CAP prior to her next cycle of treatment.   She requested 3 month refill of remeron and folic acid.   The patient was advised to call immediately if she has any concerning symptoms in the interval. The patient voices understanding of current disease status and treatment options and is in agreement with the current care plan. All questions were answered. The patient knows to call the clinic with any problems, questions or concerns. We can certainly see the patient much sooner if necessary     Orders Placed This Encounter  Procedures   CT CHEST ABDOMEN PELVIS W CONTRAST    Standing Status:   Future    Expected Date:   06/09/2023    Expiration Date:   05/25/2024    If indicated for the ordered procedure, I authorize the administration of contrast media per Radiology protocol:   Yes    Does the patient have a contrast media/X-ray dye allergy?:   No    Preferred imaging location?:   [facility]    If indicated for the ordered procedure, I authorize the administration of oral contrast media per Radiology protocol:   Yes     The total time spent in the appointment was 20-29 minutes  Fabio Wah L Caidan Hubbert, PA-C 05/26/23

## 2023-05-22 ENCOUNTER — Other Ambulatory Visit: Payer: Self-pay | Admitting: Physician Assistant

## 2023-05-22 DIAGNOSIS — C3491 Malignant neoplasm of unspecified part of right bronchus or lung: Secondary | ICD-10-CM

## 2023-05-25 DIAGNOSIS — R918 Other nonspecific abnormal finding of lung field: Secondary | ICD-10-CM | POA: Diagnosis not present

## 2023-05-26 ENCOUNTER — Inpatient Hospital Stay: Payer: PPO | Admitting: Physician Assistant

## 2023-05-26 ENCOUNTER — Inpatient Hospital Stay: Payer: PPO | Attending: Internal Medicine

## 2023-05-26 ENCOUNTER — Inpatient Hospital Stay: Payer: PPO

## 2023-05-26 VITALS — BP 144/80 | HR 93 | Temp 98.2°F | Resp 18 | Wt 158.5 lb

## 2023-05-26 DIAGNOSIS — Z5111 Encounter for antineoplastic chemotherapy: Secondary | ICD-10-CM | POA: Insufficient documentation

## 2023-05-26 DIAGNOSIS — Z9981 Dependence on supplemental oxygen: Secondary | ICD-10-CM | POA: Diagnosis not present

## 2023-05-26 DIAGNOSIS — C3491 Malignant neoplasm of unspecified part of right bronchus or lung: Secondary | ICD-10-CM

## 2023-05-26 DIAGNOSIS — C7951 Secondary malignant neoplasm of bone: Secondary | ICD-10-CM | POA: Insufficient documentation

## 2023-05-26 DIAGNOSIS — Z79899 Other long term (current) drug therapy: Secondary | ICD-10-CM | POA: Insufficient documentation

## 2023-05-26 DIAGNOSIS — D649 Anemia, unspecified: Secondary | ICD-10-CM | POA: Diagnosis not present

## 2023-05-26 DIAGNOSIS — I110 Hypertensive heart disease with heart failure: Secondary | ICD-10-CM | POA: Diagnosis not present

## 2023-05-26 DIAGNOSIS — C787 Secondary malignant neoplasm of liver and intrahepatic bile duct: Secondary | ICD-10-CM | POA: Diagnosis not present

## 2023-05-26 DIAGNOSIS — Z7901 Long term (current) use of anticoagulants: Secondary | ICD-10-CM | POA: Diagnosis not present

## 2023-05-26 DIAGNOSIS — Z79890 Hormone replacement therapy (postmenopausal): Secondary | ICD-10-CM | POA: Insufficient documentation

## 2023-05-26 DIAGNOSIS — C3431 Malignant neoplasm of lower lobe, right bronchus or lung: Secondary | ICD-10-CM | POA: Diagnosis not present

## 2023-05-26 DIAGNOSIS — E440 Moderate protein-calorie malnutrition: Secondary | ICD-10-CM | POA: Diagnosis not present

## 2023-05-26 DIAGNOSIS — Z79631 Long term (current) use of antimetabolite agent: Secondary | ICD-10-CM | POA: Diagnosis not present

## 2023-05-26 DIAGNOSIS — I288 Other diseases of pulmonary vessels: Secondary | ICD-10-CM | POA: Diagnosis not present

## 2023-05-26 DIAGNOSIS — Z95828 Presence of other vascular implants and grafts: Secondary | ICD-10-CM

## 2023-05-26 DIAGNOSIS — I517 Cardiomegaly: Secondary | ICD-10-CM | POA: Diagnosis not present

## 2023-05-26 DIAGNOSIS — I82403 Acute embolism and thrombosis of unspecified deep veins of lower extremity, bilateral: Secondary | ICD-10-CM | POA: Diagnosis not present

## 2023-05-26 DIAGNOSIS — J704 Drug-induced interstitial lung disorders, unspecified: Secondary | ICD-10-CM | POA: Insufficient documentation

## 2023-05-26 DIAGNOSIS — C3490 Malignant neoplasm of unspecified part of unspecified bronchus or lung: Secondary | ICD-10-CM | POA: Diagnosis not present

## 2023-05-26 DIAGNOSIS — G9520 Unspecified cord compression: Secondary | ICD-10-CM | POA: Diagnosis not present

## 2023-05-26 DIAGNOSIS — I509 Heart failure, unspecified: Secondary | ICD-10-CM | POA: Diagnosis not present

## 2023-05-26 DIAGNOSIS — J9601 Acute respiratory failure with hypoxia: Secondary | ICD-10-CM | POA: Diagnosis not present

## 2023-05-26 LAB — CBC WITH DIFFERENTIAL (CANCER CENTER ONLY)
Abs Immature Granulocytes: 0.01 10*3/uL (ref 0.00–0.07)
Basophils Absolute: 0 10*3/uL (ref 0.0–0.1)
Basophils Relative: 1 %
Eosinophils Absolute: 0.1 10*3/uL (ref 0.0–0.5)
Eosinophils Relative: 2 %
HCT: 40.4 % (ref 36.0–46.0)
Hemoglobin: 13.1 g/dL (ref 12.0–15.0)
Immature Granulocytes: 0 %
Lymphocytes Relative: 14 %
Lymphs Abs: 0.5 10*3/uL — ABNORMAL LOW (ref 0.7–4.0)
MCH: 29.8 pg (ref 26.0–34.0)
MCHC: 32.4 g/dL (ref 30.0–36.0)
MCV: 92 fL (ref 80.0–100.0)
Monocytes Absolute: 0.3 10*3/uL (ref 0.1–1.0)
Monocytes Relative: 9 %
Neutro Abs: 2.8 10*3/uL (ref 1.7–7.7)
Neutrophils Relative %: 74 %
Platelet Count: 183 10*3/uL (ref 150–400)
RBC: 4.39 MIL/uL (ref 3.87–5.11)
RDW: 14.2 % (ref 11.5–15.5)
WBC Count: 3.8 10*3/uL — ABNORMAL LOW (ref 4.0–10.5)
nRBC: 0 % (ref 0.0–0.2)

## 2023-05-26 LAB — CMP (CANCER CENTER ONLY)
ALT: 19 U/L (ref 0–44)
AST: 23 U/L (ref 15–41)
Albumin: 4.2 g/dL (ref 3.5–5.0)
Alkaline Phosphatase: 87 U/L (ref 38–126)
Anion gap: 8 (ref 5–15)
BUN: 12 mg/dL (ref 8–23)
CO2: 26 mmol/L (ref 22–32)
Calcium: 9.4 mg/dL (ref 8.9–10.3)
Chloride: 106 mmol/L (ref 98–111)
Creatinine: 0.61 mg/dL (ref 0.44–1.00)
GFR, Estimated: >60 mL/min (ref >60)
Glucose, Bld: 130 mg/dL — ABNORMAL HIGH (ref 70–99)
Potassium: 3.5 mmol/L (ref 3.5–5.1)
Sodium: 140 mmol/L (ref 135–145)
Total Bilirubin: 0.5 mg/dL (ref <1.2)
Total Protein: 6.7 g/dL (ref 6.5–8.1)

## 2023-05-26 LAB — TSH: TSH: 8.256 u[IU]/mL — ABNORMAL HIGH (ref 0.350–4.500)

## 2023-05-26 MED ORDER — PROCHLORPERAZINE MALEATE 10 MG PO TABS
10.0000 mg | ORAL_TABLET | Freq: Once | ORAL | Status: AC
Start: 2023-05-26 — End: 2023-05-26
  Administered 2023-05-26: 10 mg via ORAL
  Filled 2023-05-26: qty 1

## 2023-05-26 MED ORDER — SODIUM CHLORIDE 0.9% FLUSH
10.0000 mL | Freq: Once | INTRAVENOUS | Status: AC
  Administered 2023-05-26: 10 mL

## 2023-05-26 MED ORDER — FOLIC ACID 1 MG PO TABS: 1.0000 mg | ORAL_TABLET | Freq: Every day | ORAL | 1 refills | Status: DC

## 2023-05-26 MED ORDER — SODIUM CHLORIDE 0.9 % IV SOLN
Freq: Once | INTRAVENOUS | Status: AC
Start: 2023-05-26 — End: 2023-05-26

## 2023-05-26 MED ORDER — DEXAMETHASONE SODIUM PHOSPHATE 10 MG/ML IJ SOLN
10.0000 mg | Freq: Once | INTRAMUSCULAR | Status: AC
  Administered 2023-05-26: 10 mg via INTRAVENOUS
  Filled 2023-05-26: qty 1

## 2023-05-26 MED ORDER — SODIUM CHLORIDE 0.9 % IV SOLN
500.0000 mg/m2 | Freq: Once | INTRAVENOUS | Status: AC
  Administered 2023-05-26: 900 mg via INTRAVENOUS
  Filled 2023-05-26: qty 20

## 2023-05-26 MED ORDER — CYANOCOBALAMIN 1000 MCG/ML IJ SOLN
1000.0000 ug | Freq: Once | INTRAMUSCULAR | Status: AC
  Administered 2023-05-26: 1000 ug via INTRAMUSCULAR
  Filled 2023-05-26: qty 1

## 2023-05-26 MED ORDER — SODIUM CHLORIDE 0.9% FLUSH: 10.0000 mL | INTRAVENOUS | Status: DC | PRN

## 2023-05-26 MED ORDER — MIRTAZAPINE 30 MG PO TABS: 30.0000 mg | ORAL_TABLET | Freq: Every day | ORAL | 0 refills | Status: DC

## 2023-05-26 MED ORDER — HEPARIN SOD (PORK) LOCK FLUSH 100 UNIT/ML IV SOLN
500.0000 [IU] | Freq: Once | INTRAVENOUS | Status: DC | PRN
Start: 2023-05-26 — End: 2023-05-26

## 2023-05-27 LAB — T4: T4, Total: 12.1 ug/dL — ABNORMAL HIGH (ref 4.5–12.0)

## 2023-06-10 ENCOUNTER — Ambulatory Visit (HOSPITAL_COMMUNITY)
Admission: RE | Admit: 2023-06-10 | Discharge: 2023-06-10 | Disposition: A | Discharge status: Home / Self Care | Payer: PPO | Source: Ambulatory Visit | Attending: Physician Assistant | Admitting: Physician Assistant

## 2023-06-10 DIAGNOSIS — I3139 Other pericardial effusion (noninflammatory): Secondary | ICD-10-CM | POA: Diagnosis not present

## 2023-06-10 DIAGNOSIS — C3491 Malignant neoplasm of unspecified part of right bronchus or lung: Secondary | ICD-10-CM | POA: Diagnosis not present

## 2023-06-10 DIAGNOSIS — J90 Pleural effusion, not elsewhere classified: Secondary | ICD-10-CM | POA: Diagnosis not present

## 2023-06-10 DIAGNOSIS — C7951 Secondary malignant neoplasm of bone: Secondary | ICD-10-CM | POA: Diagnosis not present

## 2023-06-10 MED ORDER — IOHEXOL 300 MG/ML  SOLN
100.0000 mL | Freq: Once | INTRAMUSCULAR | Status: AC | PRN
  Administered 2023-06-10: 100 mL via INTRAVENOUS

## 2023-06-11 ENCOUNTER — Other Ambulatory Visit: Payer: Self-pay

## 2023-06-16 ENCOUNTER — Other Ambulatory Visit: Payer: PPO

## 2023-06-16 ENCOUNTER — Telehealth: Payer: Self-pay | Admitting: Internal Medicine

## 2023-06-16 ENCOUNTER — Other Ambulatory Visit: Payer: Self-pay | Admitting: Physician Assistant

## 2023-06-16 ENCOUNTER — Ambulatory Visit: Payer: PPO | Admitting: Internal Medicine

## 2023-06-16 ENCOUNTER — Ambulatory Visit: Payer: PPO

## 2023-06-16 ENCOUNTER — Telehealth: Payer: Self-pay

## 2023-06-16 ENCOUNTER — Encounter: Payer: Self-pay | Admitting: Internal Medicine

## 2023-06-16 NOTE — Telephone Encounter (Signed)
 Spoke with patient in regards to canceling appts for today.  Patient stated that she is sick and congested. Per Cassie, PA- an area on the CT scan showed possible cholecystitis.  Informed patient of symptoms of fever, abdominal pain, and nausea or vomiting. Denies these symptoms.  Informed patient that someone from scheduling will call her to reschedule port flush with lab, provider visit and infusion.

## 2023-06-17 ENCOUNTER — Encounter: Payer: Self-pay | Admitting: Internal Medicine

## 2023-06-17 ENCOUNTER — Other Ambulatory Visit: Payer: Self-pay

## 2023-06-23 NOTE — Progress Notes (Signed)
 [facility] OFFICE PROGRESS NOTE  Gwyndolyn Lerner, PA-C [address] [place] [address]  DIAGNOSIS: Stage IV (T1b, N2, M1 C) non-small cell lung cancer, adenocarcinoma presented with right lower lobe lung nodule in addition to small bilateral pulmonary nodules and right hilar and mediastinal lymphadenopathy in addition to liver and bone metastasis diagnosed in April 2024. 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   PDL1: 20%   Guardant 360: no actionable mutations.  PRIOR THERAPY:  1) Decompressive thoracic laminectomy of the T7 with spinal cord decompression and biopsy of the epidural mass in April 2024.  2) Palliative radiation to the bone under the care of Dr. Lurena Sally. Last day scheduled for 10/25/22   CURRENT THERAPY: Carboplatin  for an AUC of 5, Alimta 500 mg/m, Keytruda  200 mg IV every 3 weeks.  First dose expected on 10/28/2022. Status post 9 cycles.  Starting from cycle #5 the patient is on maintenance treatment with Alimta and Keytruda  every 3 weeks.  Starting from cycle #7 the patient will be treated with single agent Alimta 500 Mg/M2 every 3 weeks.  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   INTERVAL HISTORY: SYMARA BOCCUZZI 73 y.o. female returns to the clinic today for a follow-up visit she was last seen by myself 4 weeks ago. She is currently on single agent Alimta. Keytruda  was discontinued due to suspicious immunotherapy mediated pneumonitis after a hospitalization in September 2024.  In the interval since last being seen, she developed a URI. Several family members had the same illness.  He reported nasal congestion.  Her symptoms resolved and she is feeling better at this time. She also had a restaging CT scan that incidentally noted some dilated gallbladder with stone in slight wall thickening with  questionable trace fluid.  The patient denies any abdominal pain, nausea, vomiting, or any unusual GI symptoms.  She denies any appetite changes, fevers, or chills.  Her breathing is "fine". She only wears 0.5 of supplemental oxygen  at night. She denies any significant cough.  He estimates she only coughs once a day if she has a "tickle" in her throat.  Denies any hemoptysis.  Denies any nausea, vomiting, diarrhea, or constipation.  Denies any headaches, vision changes, or falls. She recently had a restaging CT scan performed. She is here today for evaluation and repeat blood work before undergoing cycle #10    MEDICAL HISTORY: Past Medical History:  Diagnosis Date   Allergy    Contact lens/glasses fitting    Hypertension     ALLERGIES:  is allergic to erythromycin, amoxicillin, latex, and prednisone .  MEDICATIONS:  Current Outpatient Medications  Medication Sig Dispense Refill   acetaminophen  (TYLENOL ) 500 MG tablet Take 500 mg by mouth every 6 (six) hours as needed for moderate pain.     apixaban  (ELIQUIS ) 5 MG TABS tablet Take 1 tablet (5 mg total) by mouth 2 (two) times daily. 60 tablet 2   folic acid  (FOLVITE ) 1 MG tablet Take 1 tablet (1 mg total) by mouth daily. 90 tablet 1   Ipratropium-Albuterol  (COMBIVENT ) 20-100 MCG/ACT AERS respimat Inhale 1 puff into the lungs every 6 (six) hours as needed for wheezing or shortness of breath. 4 g 1   levothyroxine  (SYNTHROID ) 112 MCG tablet Take 112 mcg by mouth daily before breakfast.     lidocaine -prilocaine  (EMLA ) cream Apply 1 Application topically as  needed. 30 g 2   mirtazapine  (REMERON ) 30 MG tablet Take 1 tablet (30 mg total) by mouth at bedtime. 90 tablet 0   potassium chloride  SA (KLOR-CON  M) 20 MEQ tablet Take 1 tablet (20 mEq total) by mouth daily. 6 tablet 0   prochlorperazine  (COMPAZINE ) 10 MG tablet Take 1 tablet (10 mg total) by mouth every 6 (six) hours as needed. (Patient taking differently: Take 10 mg by mouth every 6 (six)  hours as needed for refractory nausea / vomiting.) 30 tablet 2   VITAMIN D PO Take 1 tablet by mouth daily.     No current facility-administered medications for this visit.    SURGICAL HISTORY:  Past Surgical History:  Procedure Laterality Date   ABDOMINAL HYSTERECTOMY     BREAST SURGERY  2010   rt lumpectomy-neg   IR IMAGING GUIDED PORT INSERTION  12/18/2022   LAMINECTOMY N/A 09/21/2022   Procedure: THORACIC LAMINECTOMY FOR TUMOR;  Surgeon: Isadora Mar, MD;  Location: [facility];  Service: Neurosurgery;  Laterality: N/A;   OPEN REDUCTION INTERNAL FIXATION (ORIF) DISTAL RADIAL FRACTURE Left 11/26/2013   Procedure: OPEN REDUCTION INTERNAL FIXATION (ORIF) LEFT  DISTAL RADIUS ;  Surgeon: Ilean Mall, MD;  Location: [facility];  Service: Orthopedics;  Laterality: Left;  with block   TUBAL LIGATION      REVIEW OF SYSTEMS:   Constitutional: Negative for appetite change, chills, fatigue, fever and unexpected weight change.  HENT: Negative for mouth sores, nosebleeds, sore throat and trouble swallowing.   Eyes: Negative for eye problems and icterus.  Respiratory: Positive for stable dyspnea on exertion. Negative for cough, hemoptysis, and wheezing.     Cardiovascular: Negative for chest pain. Mild stable ankle swelling. Gastrointestinal: Negative for abdominal pain, constipation, diarrhea, nausea and vomiting.  Genitourinary: Negative for bladder incontinence, difficulty urinating, dysuria, frequency and hematuria.   Musculoskeletal: Negative for back pain, gait problem, neck pain and neck stiffness.  Skin: Negative for itching and rash.  Neurological: Negative for dizziness, extremity weakness, gait problem, headaches, light-headedness and seizures.  Hematological: Negative for adenopathy. Does not bruise/bleed easily.  Psychiatric/Behavioral: Negative for confusion, depression and sleep disturbance. The patient is not nervous/anxious.     PHYSICAL EXAMINATION:  Blood  pressure 124/82, pulse (!) 106, temperature (!) 97.3 F (36.3 C), resp. rate 18, weight 157 lb 9.6 oz (71.5 kg), SpO2 94%.  ECOG PERFORMANCE STATUS: 1  Physical Exam  Constitutional: Oriented to person, place, and time and well-developed, well-nourished, and in no distress. HENT:  Head: Normocephalic and atraumatic.  Mouth/Throat: Oropharynx is clear and moist. No oropharyngeal exudate.  Eyes: Conjunctivae are normal. Right eye exhibits no discharge. Left eye exhibits no discharge. No scleral icterus.  Neck: Normal range of motion. Neck supple.  Cardiovascular: Normal rate, regular rhythm, normal heart sounds and intact distal pulses.   Pulmonary/Chest: Effort normal and breath sounds normal. No respiratory distress. No wheezes. No rales.  Abdominal: Soft. Bowel sounds are normal. Exhibits no distension and no mass. There is no tenderness.  Musculoskeletal: Normal range of motion. Stable bilateral lower extremity edema.  Lymphadenopathy:    No cervical adenopathy.  Neurological: Alert and oriented to person, place, and time. Exhibits muscle wasting. Examined in the wheelchair.  Skin: Skin is warm and dry. No rash noted. Not diaphoretic. No erythema. No pallor.  Psychiatric: Mood, memory and judgment normal.  Vitals reviewed.    LABORATORY DATA: Lab Results  Component Value Date   WBC 6.2 06/25/2023  HGB 14.0 06/25/2023   HCT 41.6 06/25/2023   MCV 88.9 06/25/2023   PLT 155 06/25/2023      Chemistry      Component Value Date/Time   NA 140 05/26/2023 1112   K 3.5 05/26/2023 1112   CL 106 05/26/2023 1112   CO2 26 05/26/2023 1112   BUN 12 05/26/2023 1112   CREATININE 0.61 05/26/2023 1112      Component Value Date/Time   CALCIUM 9.4 05/26/2023 1112   ALKPHOS 87 05/26/2023 1112   AST 23 05/26/2023 1112   ALT 19 05/26/2023 1112   BILITOT 0.5 05/26/2023 1112       RADIOGRAPHIC STUDIES:  CT CHEST ABDOMEN PELVIS W CONTRAST Result Date: 06/16/2023 CLINICAL DATA:  Stage IV  right lung adenocarcinoma. Metastatic disease evaluation. * Tracking Code: BO * EXAM: CT CHEST, ABDOMEN, AND PELVIS WITH CONTRAST TECHNIQUE: Multidetector CT imaging of the chest, abdomen and pelvis was performed following the standard protocol during bolus administration of intravenous contrast. RADIATION DOSE REDUCTION: This exam was performed according to the departmental dose-optimization program which includes automated exposure control, adjustment of the mA and/or kV according to patient size and/or use of iterative reconstruction technique. CONTRAST:  OMNIPAQUE  IOHEXOL  300 MG/ML  SOLN COMPARISON:  CT angiogram 03/01/2023. CT chest abdomen pelvis 02/27/2023. FINDINGS: CT CHEST FINDINGS Cardiovascular: Right upper chest port in place with tip seen as far as the right atrium. Coronary artery calcifications are seen. Heart itself is nonenlarged. The thoracic aorta has a normal course and caliber with mild atherosclerotic calcified plaque. There is a new small to moderate pericardial effusion. Mediastinum/Nodes: Atrophic thyroid  gland with a small right-sided low-attenuation thyroid  nodule, unchanged. Patulous esophagus. Slight wall thickening of the esophagus is again seen. No specific abnormal lymph node enlargement identified in the axillary region, hilum. Few small mediastinal nodes are again seen and not significantly changed. Precarinal node has a short axis today of 9 mm and previously 11 mm. No clear nodal new enlargement. Lungs/Pleura: New small right and tiny left pleural effusion. Underlying emphysematous lung changes particularly in the upper lung zones. There are areas of scarring, atelectatic changes and some fibrosis once again seen in both lungs. The areas of lower lung presumed atelectasis and ground-glass has shown some improvement since the prior CT scan. Once again there are some areas of nodularity again identified and will be followed. This includes a spiculated right lower lobe nodule  which today on image 71 of series 4 measures 1.5 x 1.2 cm. Previously when measured in the same fashion as today the lesion would have measured 1.6 by 1.1 cm. The right apical lesion which measured 6 mm on the prior, today measures 7 mm on series 4, image 30, not significantly changed. On the prior there is also an area of nodular opacity posteromedial in the lower lobe. This areas obscured by the pleural effusion in the increasing adjacent lung opacity. Presumed atelectasis. Musculoskeletal: Diffuse degenerative changes along the spine. Stable sclerotic focus along a right-sided anterior rib on image 25. This has severe kyphosis with compression deformities and sclerosis along the midthoracic spine is stable with surgical changes from laminectomy and canal encroachment. Other areas of bony sclerosis as well in the spine is similar in distribution to the previous exams. Osseous metastatic disease. CT ABDOMEN PELVIS FINDINGS Hepatobiliary: Multiple hepatic cystic lesions are again seen and unchanged. There is a lesion in the right hepatic lobe which is not clearly a simple cyst but is slightly smaller today measuring  11 mm. Previously transverse dimension of 14 mm. This could be technical. Patent portal vein. Gallbladder is dilated with a stone. There is also mild ectasia of the extrahepatic common duct. This has seen previously. Normal tapering of the distal common duct towards the pancreas. Pancreas: Unremarkable. No pancreatic ductal dilatation or surrounding inflammatory changes. Spleen: Normal in size without focal abnormality. Adrenals/Urinary Tract: Adrenal glands are unremarkable. Kidneys are normal, without renal calculi, focal lesion, or hydronephrosis. Bladder is low-lying, small cystocele. Stomach/Bowel: Stomach is within normal limits. Appendix appears normal. No evidence of bowel wall thickening, distention, or inflammatory changes. Scattered colonic stool. Left-sided colonic diverticula.  Vascular/Lymphatic: Aortic atherosclerosis. No enlarged abdominal or pelvic lymph nodes. Few small upper abdominal retroperitoneal nodes are nonpathologic by size criteria and not significantly changed. Reproductive: Status post hysterectomy. No adnexal masses. Other: Mild anasarca.  No free air or free fluid. Musculoskeletal: Degenerative changes of the spine and pelvis. Few scattered sclerotic lesions are stable. Larger areas seen in the vertebral body at L2, similar to previous. Multilevel listhesis seen along the spine with the areas of stenosis. IMPRESSION: Stable spiculated nodules in the right lung. There is 1 area previously in the posteromedial right lower lobe which is obscured today by the new small right pleural effusion in the adjacent presumed compressive atelectasis. Attention on follow-up. New tiny left effusion. New small to moderate pericardial effusion. Please correlate with history and symptoms. Stable cystic liver lesions. The 1 complex lesion in the right hepatic lobe appears slightly smaller today. Multifocal sclerotic bone metastases again identified. Stable appearance of kyphosis with a compression of the midthoracic spine and stenosis with surgical changes. Dilated gallbladder with stone and slight wall thickening. Question trace fluid. Please correlate for any clinical symptoms of acute cholecystitis or gallbladder pathology. If needed further workup such as HIDA scan or ultrasound as clinically appropriate. Findings will be called to the ordering service by the Radiology physician assistant team asap Electronically Signed   By: Adrianna Horde M.D.   On: 06/16/2023 12:55     ASSESSMENT/PLAN:  This is a very pleasant 73 year old Caucasian female recently diagnosed with stage IV (T1b, N2, M1 C) non-small cell lung cancer, adenocarcinoma presented with right lower lobe lung nodule in addition to small bilateral pulmonary nodules and right hilar and mediastinal lymphadenopathy in addition  to liver and bone metastasis diagnosed in April 2024. 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 Her PDL1 expression is 20%. She has no actionable mutations.    She is completed palliative radiation to the painful metastatic bone lesions under the care of Dr. Lurena Sally in the last day radiation was on 10/25/2022.    She is currently undergoing systemic chemotherapy with carboplatin  for an AUC of 5, Alimta 500 mg/m, and Keytruda  200 mg IV every 3 weeks. She is status post 9 cycles.  Starting from cycle #5 she was on maintenance treatment with Alimta and Keytruda .  Starting after cycle #6 Keytruda  was discontinued from the care plan due to suspicious immunotherapy mediated pneumonitis.  Therefore, she is currently undergoing single agent chemotherapy with Alimta IV every 3 weeks.  The patient was seen with Dr. Marguerita Shih today.  Dr. Marguerita Shih personally and independently reviewed the scan and discussed results with the patient today.  The scan showed no evidence of disease progression.  The scan did have some incidental findings with dilated gallbladder and slight wall thickening.  The patient denies any unusual abdominal pain.  She did not have any tenderness to deep palpation in the right upper quadrant.  She is advised to monitor for any unusual symptoms such as abdominal pain, fevers, nausea, vomiting, etc. There is no leukocytosis on labs. The scan did show some new small to moderate pericardial effusion.  The patient did have an echo performed during her hospitalization September 2024.  There is no pericardial effusion at this time.  Dr. Marguerita Shih recommends monitoring this closely.  If worsening, Dr. Marguerita Shih would recommend referral to cardiology.   Labs were reviewed. Recommend that she proceed with cycle #10 today as scheduled.     We will see her back for follow-up visit in 3 weeks for evaluation and repeat  blood work before undergoing cycle #11    The patient was advised to call immediately if she has any concerning symptoms in the interval. The patient voices understanding of current disease status and treatment options and is in agreement with the current care plan. All questions were answered. The patient knows to call the clinic with any problems, questions or concerns. We can certainly see the patient much sooner if necessary    No orders of the defined types were placed in this encounter.    Tashay Bozich L Ayianna Darnold, PA-C 06/25/23  ADDENDUM: Hematology/Oncology Attending: I had a face-to-face encounter with the patient today.  I reviewed her record, lab, scan and recommended her care plan.  This is a very pleasant 73 years old white female with stage IV non-small cell lung cancer, adenocarcinoma diagnosed in April 2024 with PD-L1 expression of 20% and no actionable mutations.  After palliative radiation and decompressive thoracic laminectomy of the T7 tumor, the patient started systemic chemotherapy initially with carboplatin , Alimta and Keytruda  for 4 cycles and then she has been on maintenance treatment with Alimta and Keytruda  then single agent Alimta starting from cycle #7.  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 The patient has been tolerating her treatment fairly well.  She is here today for evaluation accompanied by her older sister.  She had repeat CT scan of the chest, abdomen and pelvis performed recently.  I personally and independently reviewed the scan and discussed the result with the patient today. Her scan showed no concerning findings for disease progression but she has a small to moderate pericardial effusion and on reviewing her medical record from the hospital in September 2020 four 2D echo showed no evidence for pericardial fluid at that time.  The patient is currently asymptomatic. I recommended for her to continue her  current treatment with Alimta and she will proceed with cycle #10 today.  If she has any concerning shortness of breath or cardiac issues in the future, we will refer her to cardiology for further evaluation and repeat 2D echo to evaluate the suspicious pericardial effusion. The patient also has no symptoms consistent with the biliary duct dilatation seen on the scan. She will come back for follow-up visit in 3 weeks for evaluation before starting cycle #10. The patient was advised to call immediately if she has any other concerning symptoms in the interval. The total time spent in the appointment was 30 minutes. Disclaimer: This note was dictated with voice recognition software. Similar sounding words can inadvertently be transcribed and may be missed upon review. Aurelio Blower, MD

## 2023-06-25 ENCOUNTER — Inpatient Hospital Stay: Payer: PPO | Attending: Internal Medicine

## 2023-06-25 ENCOUNTER — Inpatient Hospital Stay: Payer: PPO

## 2023-06-25 ENCOUNTER — Inpatient Hospital Stay: Payer: PPO | Admitting: Physician Assistant

## 2023-06-25 ENCOUNTER — Encounter: Payer: Self-pay | Admitting: Internal Medicine

## 2023-06-25 VITALS — HR 95

## 2023-06-25 VITALS — BP 124/82 | HR 106 | Temp 97.3°F | Resp 18 | Wt 157.6 lb

## 2023-06-25 DIAGNOSIS — C3431 Malignant neoplasm of lower lobe, right bronchus or lung: Secondary | ICD-10-CM | POA: Diagnosis present

## 2023-06-25 DIAGNOSIS — Z95828 Presence of other vascular implants and grafts: Secondary | ICD-10-CM

## 2023-06-25 DIAGNOSIS — C3491 Malignant neoplasm of unspecified part of right bronchus or lung: Secondary | ICD-10-CM | POA: Diagnosis not present

## 2023-06-25 DIAGNOSIS — Z5111 Encounter for antineoplastic chemotherapy: Secondary | ICD-10-CM

## 2023-06-25 DIAGNOSIS — C787 Secondary malignant neoplasm of liver and intrahepatic bile duct: Secondary | ICD-10-CM | POA: Diagnosis not present

## 2023-06-25 DIAGNOSIS — C7951 Secondary malignant neoplasm of bone: Secondary | ICD-10-CM | POA: Diagnosis not present

## 2023-06-25 LAB — COMPREHENSIVE METABOLIC PANEL
ALT: 12 U/L (ref 0–44)
AST: 19 U/L (ref 15–41)
Albumin: 4.4 g/dL (ref 3.5–5.0)
Alkaline Phosphatase: 86 U/L (ref 38–126)
Anion gap: 6 (ref 5–15)
BUN: 13 mg/dL (ref 8–23)
CO2: 30 mmol/L (ref 22–32)
Calcium: 9.8 mg/dL (ref 8.9–10.3)
Chloride: 104 mmol/L (ref 98–111)
Creatinine, Ser: 0.59 mg/dL (ref 0.44–1.00)
GFR, Estimated: >60 mL/min (ref >60)
Glucose, Bld: 105 mg/dL — ABNORMAL HIGH (ref 70–99)
Potassium: 3.6 mmol/L (ref 3.5–5.1)
Sodium: 140 mmol/L (ref 135–145)
Total Bilirubin: 0.4 mg/dL (ref 0.0–1.2)
Total Protein: 7.1 g/dL (ref 6.5–8.1)

## 2023-06-25 LAB — CBC WITH DIFFERENTIAL/PLATELET
Abs Immature Granulocytes: 0.01 10*3/uL (ref 0.00–0.07)
Basophils Absolute: 0 10*3/uL (ref 0.0–0.1)
Basophils Relative: 1 %
Eosinophils Absolute: 0.1 10*3/uL (ref 0.0–0.5)
Eosinophils Relative: 1 %
HCT: 41.6 % (ref 36.0–46.0)
Hemoglobin: 14 g/dL (ref 12.0–15.0)
Immature Granulocytes: 0 %
Lymphocytes Relative: 11 %
Lymphs Abs: 0.7 10*3/uL (ref 0.7–4.0)
MCH: 29.9 pg (ref 26.0–34.0)
MCHC: 33.7 g/dL (ref 30.0–36.0)
MCV: 88.9 fL (ref 80.0–100.0)
Monocytes Absolute: 0.5 10*3/uL (ref 0.1–1.0)
Monocytes Relative: 8 %
Neutro Abs: 4.9 10*3/uL (ref 1.7–7.7)
Neutrophils Relative %: 79 %
Platelets: 155 10*3/uL (ref 150–400)
RBC: 4.68 MIL/uL (ref 3.87–5.11)
RDW: 13.2 % (ref 11.5–15.5)
WBC: 6.2 10*3/uL (ref 4.0–10.5)
nRBC: 0 % (ref 0.0–0.2)

## 2023-06-25 MED ORDER — DEXAMETHASONE SODIUM PHOSPHATE 10 MG/ML IJ SOLN
10.0000 mg | Freq: Once | INTRAMUSCULAR | Status: AC
Start: 2023-06-25 — End: 2023-06-25
  Administered 2023-06-25: 10 mg via INTRAVENOUS
  Filled 2023-06-25: qty 1

## 2023-06-25 MED ORDER — SODIUM CHLORIDE 0.9% FLUSH
10.0000 mL | INTRAVENOUS | Status: DC | PRN
  Administered 2023-06-25: 10 mL

## 2023-06-25 MED ORDER — PROCHLORPERAZINE MALEATE 10 MG PO TABS
10.0000 mg | ORAL_TABLET | Freq: Once | ORAL | Status: AC
  Administered 2023-06-25: 10 mg via ORAL
  Filled 2023-06-25: qty 1

## 2023-06-25 MED ORDER — SODIUM CHLORIDE 0.9% FLUSH
10.0000 mL | Freq: Once | INTRAVENOUS | Status: AC
  Administered 2023-06-25: 10 mL

## 2023-06-25 MED ORDER — SODIUM CHLORIDE 0.9 % IV SOLN
500.0000 mg/m2 | Freq: Once | INTRAVENOUS | Status: AC
  Administered 2023-06-25: 900 mg via INTRAVENOUS
  Filled 2023-06-25: qty 20

## 2023-06-25 MED ORDER — SODIUM CHLORIDE 0.9 % IV SOLN: Freq: Once | INTRAVENOUS | Status: AC

## 2023-06-25 MED ORDER — HEPARIN SOD (PORK) LOCK FLUSH 100 UNIT/ML IV SOLN
500.0000 [IU] | Freq: Once | INTRAVENOUS | Status: AC | PRN
  Administered 2023-06-25: 500 [IU]

## 2023-06-27 ENCOUNTER — Other Ambulatory Visit: Payer: Self-pay

## 2023-07-07 ENCOUNTER — Ambulatory Visit: Payer: PPO | Admitting: Physician Assistant

## 2023-07-07 ENCOUNTER — Other Ambulatory Visit: Payer: PPO

## 2023-07-07 ENCOUNTER — Ambulatory Visit: Payer: PPO

## 2023-07-16 ENCOUNTER — Inpatient Hospital Stay: Payer: PPO | Admitting: Internal Medicine

## 2023-07-16 ENCOUNTER — Inpatient Hospital Stay: Payer: PPO

## 2023-07-16 ENCOUNTER — Inpatient Hospital Stay: Payer: PPO | Attending: Internal Medicine

## 2023-07-16 VITALS — BP 139/84 | HR 107 | Temp 98.2°F | Resp 18 | Ht 63.0 in | Wt 158.1 lb

## 2023-07-16 VITALS — HR 95

## 2023-07-16 DIAGNOSIS — Z5111 Encounter for antineoplastic chemotherapy: Secondary | ICD-10-CM | POA: Diagnosis present

## 2023-07-16 DIAGNOSIS — C3431 Malignant neoplasm of lower lobe, right bronchus or lung: Secondary | ICD-10-CM | POA: Diagnosis present

## 2023-07-16 DIAGNOSIS — C3491 Malignant neoplasm of unspecified part of right bronchus or lung: Secondary | ICD-10-CM | POA: Diagnosis not present

## 2023-07-16 DIAGNOSIS — I10 Essential (primary) hypertension: Secondary | ICD-10-CM | POA: Insufficient documentation

## 2023-07-16 DIAGNOSIS — C7951 Secondary malignant neoplasm of bone: Secondary | ICD-10-CM | POA: Diagnosis not present

## 2023-07-16 DIAGNOSIS — C787 Secondary malignant neoplasm of liver and intrahepatic bile duct: Secondary | ICD-10-CM | POA: Insufficient documentation

## 2023-07-16 DIAGNOSIS — Z95828 Presence of other vascular implants and grafts: Secondary | ICD-10-CM

## 2023-07-16 LAB — CBC WITH DIFFERENTIAL (CANCER CENTER ONLY)
Abs Immature Granulocytes: 0.01 10*3/uL (ref 0.00–0.07)
Basophils Absolute: 0 10*3/uL (ref 0.0–0.1)
Basophils Relative: 1 %
Eosinophils Absolute: 0.1 10*3/uL (ref 0.0–0.5)
Eosinophils Relative: 4 %
HCT: 41.9 % (ref 36.0–46.0)
Hemoglobin: 13.7 g/dL (ref 12.0–15.0)
Immature Granulocytes: 0 %
Lymphocytes Relative: 19 %
Lymphs Abs: 0.6 10*3/uL — ABNORMAL LOW (ref 0.7–4.0)
MCH: 29.5 pg (ref 26.0–34.0)
MCHC: 32.7 g/dL (ref 30.0–36.0)
MCV: 90.3 fL (ref 80.0–100.0)
Monocytes Absolute: 0.3 10*3/uL (ref 0.1–1.0)
Monocytes Relative: 11 %
Neutro Abs: 1.8 10*3/uL (ref 1.7–7.7)
Neutrophils Relative %: 65 %
Platelet Count: 211 10*3/uL (ref 150–400)
RBC: 4.64 MIL/uL (ref 3.87–5.11)
RDW: 13.4 % (ref 11.5–15.5)
WBC Count: 2.9 10*3/uL — ABNORMAL LOW (ref 4.0–10.5)
nRBC: 0 % (ref 0.0–0.2)

## 2023-07-16 LAB — CMP (CANCER CENTER ONLY)
ALT: 18 U/L (ref 0–44)
AST: 21 U/L (ref 15–41)
Albumin: 4.3 g/dL (ref 3.5–5.0)
Alkaline Phosphatase: 92 U/L (ref 38–126)
Anion gap: 6 (ref 5–15)
BUN: 11 mg/dL (ref 8–23)
CO2: 28 mmol/L (ref 22–32)
Calcium: 9.4 mg/dL (ref 8.9–10.3)
Chloride: 107 mmol/L (ref 98–111)
Creatinine: 0.65 mg/dL (ref 0.44–1.00)
GFR, Estimated: >60 mL/min (ref >60)
Glucose, Bld: 128 mg/dL — ABNORMAL HIGH (ref 70–99)
Potassium: 3.7 mmol/L (ref 3.5–5.1)
Sodium: 141 mmol/L (ref 135–145)
Total Bilirubin: 0.5 mg/dL (ref 0.0–1.2)
Total Protein: 6.9 g/dL (ref 6.5–8.1)

## 2023-07-16 MED ORDER — SODIUM CHLORIDE 0.9 % IV SOLN
Freq: Once | INTRAVENOUS | Status: AC
Start: 2023-07-16 — End: 2023-07-16

## 2023-07-16 MED ORDER — SODIUM CHLORIDE 0.9% FLUSH
10.0000 mL | Freq: Once | INTRAVENOUS | Status: AC
Start: 2023-07-16 — End: 2023-07-16
  Administered 2023-07-16: 10 mL

## 2023-07-16 MED ORDER — SODIUM CHLORIDE 0.9 % IV SOLN
500.0000 mg/m2 | Freq: Once | INTRAVENOUS | Status: AC
  Administered 2023-07-16: 900 mg via INTRAVENOUS
  Filled 2023-07-16: qty 20

## 2023-07-16 MED ORDER — HEPARIN SOD (PORK) LOCK FLUSH 100 UNIT/ML IV SOLN
500.0000 [IU] | Freq: Once | INTRAVENOUS | Status: AC | PRN
  Administered 2023-07-16: 500 [IU]

## 2023-07-16 MED ORDER — PROCHLORPERAZINE MALEATE 10 MG PO TABS
10.0000 mg | ORAL_TABLET | Freq: Once | ORAL | Status: AC
  Administered 2023-07-16: 10 mg via ORAL
  Filled 2023-07-16: qty 1

## 2023-07-16 MED ORDER — DEXAMETHASONE SODIUM PHOSPHATE 10 MG/ML IJ SOLN
10.0000 mg | Freq: Once | INTRAMUSCULAR | Status: AC
  Administered 2023-07-16: 10 mg via INTRAVENOUS
  Filled 2023-07-16: qty 1

## 2023-07-16 MED ORDER — SODIUM CHLORIDE 0.9% FLUSH
10.0000 mL | INTRAVENOUS | Status: DC | PRN
  Administered 2023-07-16: 10 mL

## 2023-07-16 NOTE — Progress Notes (Signed)
 [facility] [phone]   [phone]  OFFICE PROGRESS NOTE  Hayley Thresa BROCKS, PA-C [address] [place] [address]  DIAGNOSIS: Stage IV (T1b, N2, M1 C) non-small cell lung cancer, adenocarcinoma presented with right lower lobe lung nodule in addition to small bilateral pulmonary nodules and right hilar and mediastinal lymphadenopathy in addition to liver and bone metastasis diagnosed in April 2024. 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   PDL1: 20%   Guardant 360: no actionable mutations.    PRIOR THERAPY: 1) Decompressive thoracic laminectomy of the T7 with spinal cord decompression and biopsy of the epidural mass in April 2024.  2) Palliative radiation to the bone under the care of Dr. Izell. Last day scheduled for 10/25/22    CURRENT THERAPY: Carboplatin  for an AUC of 5, Alimta  500 mg/m, Keytruda  200 mg IV every 3 weeks.  First dose expected on 10/28/2022. Status post 10 cycles.  Starting from cycle #5 the patient is on maintenance treatment with Alimta  and Keytruda  every 3 weeks.  Starting from cycle #7 the patient will be treated with single agent Alimta  500 Mg/M2 every 3 weeks.  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  INTERVAL HISTORY: Hayley Wu 73 y.o. female returns to the clinic today for follow-up visit.Discussed the use of AI scribe software for clinical note transcription with the patient, who gave verbal consent to proceed.  History of Present Illness   Hayley Wu is a 73 year old female with stage four non-small cell lung cancer who presents for chemotherapy follow-up.  Diagnosed with stage four non-small cell lung cancer, adenocarcinoma subtype, in April 2024. The tumor has no actionable mutations, but PD-L1 expression is 20%.  Currently undergoing chemotherapy  and is on cycle number eleven of Alimta . No new complaints since the last visit three weeks ago.  No chest pain, shortness of breath, cough, hemoptysis, nausea, vomiting, diarrhea, or weight loss. Weight has remained stable.  Current medication regimen includes amlodipine  10 mg daily.       MEDICAL HISTORY: Past Medical History:  Diagnosis Date   Allergy    Contact lens/glasses fitting    Hypertension     ALLERGIES:  is allergic to erythromycin, amoxicillin, latex, and prednisone .  MEDICATIONS:  Current Outpatient Medications  Medication Sig Dispense Refill   acetaminophen  (TYLENOL ) 500 MG tablet Take 500 mg by mouth every 6 (six) hours as needed for moderate pain.     apixaban  (ELIQUIS ) 5 MG TABS tablet Take 1 tablet (5 mg total) by mouth 2 (two) times daily. 60 tablet 2   folic acid  (FOLVITE ) 1 MG tablet Take 1 tablet (1 mg total) by mouth daily. 90 tablet 1   Ipratropium-Albuterol  (COMBIVENT ) 20-100 MCG/ACT AERS respimat Inhale 1 puff into the lungs every 6 (six) hours as needed for wheezing or shortness of breath. 4 g 1   levothyroxine  (SYNTHROID ) 112 MCG tablet Take 112 mcg by mouth daily before breakfast.     lidocaine -prilocaine  (EMLA ) cream Apply 1 Application topically as needed. 30 g 2   mirtazapine  (REMERON ) 30 MG tablet Take 1 tablet (30 mg total) by mouth at bedtime. 90 tablet 0   potassium chloride  SA (KLOR-CON  M) 20 MEQ tablet Take 1 tablet (20 mEq total) by mouth daily. 6 tablet 0   prochlorperazine  (COMPAZINE ) 10 MG tablet Take 1  tablet (10 mg total) by mouth every 6 (six) hours as needed. (Patient taking differently: Take 10 mg by mouth every 6 (six) hours as needed for refractory nausea / vomiting.) 30 tablet 2   VITAMIN D PO Take 1 tablet by mouth daily.     No current facility-administered medications for this visit.    SURGICAL HISTORY:  Past Surgical History:  Procedure Laterality Date   ABDOMINAL HYSTERECTOMY     BREAST SURGERY  2010   rt lumpectomy-neg    IR IMAGING GUIDED PORT INSERTION  12/18/2022   LAMINECTOMY N/A 09/21/2022   Procedure: THORACIC LAMINECTOMY FOR TUMOR;  Surgeon: Joshua Alm RAMAN, MD;  Location: [facility] OR;  Service: Neurosurgery;  Laterality: N/A;   OPEN REDUCTION INTERNAL FIXATION (ORIF) DISTAL RADIAL FRACTURE Left 11/26/2013   Procedure: OPEN REDUCTION INTERNAL FIXATION (ORIF) LEFT  DISTAL RADIUS ;  Surgeon: Dempsey JINNY Sensor, MD;  Location: [facility];  Service: Orthopedics;  Laterality: Left;  with block   TUBAL LIGATION      REVIEW OF SYSTEMS:  A comprehensive review of systems was negative except for: Musculoskeletal: positive for muscle weakness   PHYSICAL EXAMINATION: General appearance: alert, cooperative, and no distress Head: Normocephalic, without obvious abnormality, atraumatic Neck: no adenopathy, no JVD, supple, symmetrical, trachea midline, and thyroid  not enlarged, symmetric, no tenderness/mass/nodules Lymph nodes: Cervical, supraclavicular, and axillary nodes normal. Resp: clear to auscultation bilaterally Back: symmetric, no curvature. ROM normal. No CVA tenderness. Cardio: regular rate and rhythm, S1, S2 normal, no murmur, click, rub or gallop GI: soft, non-tender; bowel sounds normal; no masses,  no organomegaly Extremities: extremities normal, atraumatic, no cyanosis or edema  ECOG PERFORMANCE STATUS: 1 - Symptomatic but completely ambulatory  Blood pressure 139/84, pulse (!) 107, temperature 98.2 F (36.8 C), temperature source Oral, resp. rate 18, height 5' 3 (1.6 m), weight 158 lb 2 oz (71.7 kg), SpO2 97%.  LABORATORY DATA: Lab Results  Component Value Date   WBC 6.2 06/25/2023   HGB 14.0 06/25/2023   HCT 41.6 06/25/2023   MCV 88.9 06/25/2023   PLT 155 06/25/2023      Chemistry      Component Value Date/Time   NA 140 06/25/2023 1439   K 3.6 06/25/2023 1439   CL 104 06/25/2023 1439   CO2 30 06/25/2023 1439   BUN 13 06/25/2023 1439   CREATININE 0.59 06/25/2023 1439    CREATININE 0.61 05/26/2023 1112      Component Value Date/Time   CALCIUM 9.8 06/25/2023 1439   ALKPHOS 86 06/25/2023 1439   AST 19 06/25/2023 1439   AST 23 05/26/2023 1112   ALT 12 06/25/2023 1439   ALT 19 05/26/2023 1112   BILITOT 0.4 06/25/2023 1439   BILITOT 0.5 05/26/2023 1112       RADIOGRAPHIC STUDIES: No results found.  ASSESSMENT AND PLAN: This is a very pleasant 73 years old white female with Stage IV (T1b, N2, M1 C) non-small cell lung cancer, adenocarcinoma presented with right lower lobe lung nodule in addition to small bilateral pulmonary nodules and right hilar and mediastinal lymphadenopathy in addition to liver and bone metastasis diagnosed in April 2024. 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 Molecular studies showed no actionable mutations and PD-L1 expression of 20%. 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  She also had palliative radiation to the bone under the care of Dr. Izell. Last day scheduled for 10/25/22. She is currently on palliative systemic chemotherapy with carboplatin  for AUC of 5, Alimta  500 Mg/M2 and Keytruda  200 Mg IV every 3 weeks status post 10 cycles.  Starting from cycle #5 the patient is on maintenance treatment with Alimta  and Keytruda  every 3 weeks.  After cycle #6 Keytruda  was discontinued secondary to suspicious immunotherapy mediated pneumonitis with recent hospitalization and acute respiratory failure. The patient has been tolerating her treatment with single agent Alimta  fairly well.    Stage IV Non-Small Cell Lung Cancer (Adenocarcinoma) Diagnosed in April 2024. Tumor has no actionable mutations, PD-L1 expression is 20%. Currently undergoing chemotherapy with Alimta . No new symptoms such as chest pain, dyspnea, hemoptysis, nausea,  vomiting, or diarrhea. Weight is stable. WBC is slightly low but acceptable for treatment. Mobile with a walker. - Proceed with cycle eleven of Alimta  chemotherapy today.  Hypertension Managed with amlodipine  10 mg daily. No changes in medication since the last visit. - Continue amlodipine  10 mg daily.   The patient was advised to call immediately if she has any concerning symptoms in the interval. The patient voices understanding of current disease status and treatment options and is in agreement with the current care plan.  All questions were answered. The patient knows to call the clinic with any problems, questions or concerns. We can certainly see the patient much sooner if necessary.  The total time spent in the appointment was 20 minutes.  Disclaimer: This note was dictated with voice recognition software. Similar sounding words can inadvertently be transcribed and may not be corrected upon review.

## 2023-07-16 NOTE — Addendum Note (Signed)
Addended by: Tonny Bollman on: 07/16/2023 05:50 PM   Modules accepted: Orders

## 2023-07-16 NOTE — Patient Instructions (Signed)
 [facility] - A DEPT OF [facility]  Discharge Instructions: Thank you for choosing [facility] to provide your oncology and hematology care.   If you have a lab appointment with the Cancer Center, please go directly to the Cancer Center and check in at the registration area.   Wear comfortable clothing and clothing appropriate for easy access to any Portacath or PICC line.   We strive to give you quality time with your provider. You may need to reschedule your appointment if you arrive late (15 or more minutes).  Arriving late affects you and other patients whose appointments are after yours.  Also, if you miss three or more appointments without notifying the office, you may be dismissed from the clinic at the provider's discretion.      For prescription refill requests, have your pharmacy contact our office and allow 72 hours for refills to be completed.    Today you received the following chemotherapy and/or immunotherapy agents Alimta      To help prevent nausea and vomiting after your treatment, we encourage you to take your nausea medication as directed.  BELOW ARE SYMPTOMS THAT SHOULD BE REPORTED IMMEDIATELY: *FEVER GREATER THAN 100.4 F (38 C) OR HIGHER *CHILLS OR SWEATING *NAUSEA AND VOMITING THAT IS NOT CONTROLLED WITH YOUR NAUSEA MEDICATION *UNUSUAL SHORTNESS OF BREATH *UNUSUAL BRUISING OR BLEEDING *URINARY PROBLEMS (pain or burning when urinating, or frequent urination) *BOWEL PROBLEMS (unusual diarrhea, constipation, pain near the anus) TENDERNESS IN MOUTH AND THROAT WITH OR WITHOUT PRESENCE OF ULCERS (sore throat, sores in mouth, or a toothache) UNUSUAL RASH, SWELLING OR PAIN  UNUSUAL VAGINAL DISCHARGE OR ITCHING   Items with * indicate a potential emergency and should be followed up as soon as possible or go to the Emergency Department if any problems should occur.  Please show the CHEMOTHERAPY ALERT CARD or IMMUNOTHERAPY  ALERT CARD at check-in to the Emergency Department and triage nurse.  Should you have questions after your visit or need to cancel or reschedule your appointment, please contact [facility] - A DEPT OF [facility]  Dept: [phone]  and follow the prompts.  Office hours are 8:00 a.m. to 4:30 p.m. Monday - Friday. Please note that voicemails left after 4:00 p.m. may not be returned until the following business day.  We are closed weekends and major holidays. You have access to a nurse at all times for urgent questions. Please call the main number to the clinic Dept: [phone] and follow the prompts.   For any non-urgent questions, you may also contact your provider using MyChart. We now offer e-Visits for anyone 79 and older to request care online for non-urgent symptoms. For details visit [email or web address].   Also download the MyChart app! Go to the app store, search "MyChart", open the app, select [name], and log in with your MyChart username and password.

## 2023-07-28 ENCOUNTER — Ambulatory Visit: Payer: PPO

## 2023-07-28 ENCOUNTER — Ambulatory Visit: Payer: PPO | Admitting: Internal Medicine

## 2023-07-28 ENCOUNTER — Other Ambulatory Visit: Payer: PPO

## 2023-08-02 NOTE — Progress Notes (Signed)
 Updated pemetrexed ERX to [id number]   Pryor Ochoa, PharmD 08/02/23

## 2023-08-06 ENCOUNTER — Inpatient Hospital Stay: Payer: PPO

## 2023-08-06 ENCOUNTER — Inpatient Hospital Stay (HOSPITAL_BASED_OUTPATIENT_CLINIC_OR_DEPARTMENT_OTHER): Payer: PPO | Admitting: Internal Medicine

## 2023-08-06 VITALS — HR 97

## 2023-08-06 VITALS — BP 137/92 | HR 103 | Temp 98.3°F | Resp 16 | Ht 63.0 in | Wt 161.0 lb

## 2023-08-06 DIAGNOSIS — C3491 Malignant neoplasm of unspecified part of right bronchus or lung: Secondary | ICD-10-CM

## 2023-08-06 DIAGNOSIS — Z5111 Encounter for antineoplastic chemotherapy: Secondary | ICD-10-CM | POA: Diagnosis not present

## 2023-08-06 DIAGNOSIS — C3490 Malignant neoplasm of unspecified part of unspecified bronchus or lung: Secondary | ICD-10-CM

## 2023-08-06 DIAGNOSIS — C7951 Secondary malignant neoplasm of bone: Secondary | ICD-10-CM

## 2023-08-06 DIAGNOSIS — Z95828 Presence of other vascular implants and grafts: Secondary | ICD-10-CM

## 2023-08-06 LAB — CBC WITH DIFFERENTIAL (CANCER CENTER ONLY)
Abs Immature Granulocytes: 0.01 10*3/uL (ref 0.00–0.07)
Basophils Absolute: 0 10*3/uL (ref 0.0–0.1)
Basophils Relative: 1 %
Eosinophils Absolute: 0.1 10*3/uL (ref 0.0–0.5)
Eosinophils Relative: 3 %
HCT: 42 % (ref 36.0–46.0)
Hemoglobin: 13.9 g/dL (ref 12.0–15.0)
Immature Granulocytes: 0 %
Lymphocytes Relative: 20 %
Lymphs Abs: 0.7 10*3/uL (ref 0.7–4.0)
MCH: 30.2 pg (ref 26.0–34.0)
MCHC: 33.1 g/dL (ref 30.0–36.0)
MCV: 91.1 fL (ref 80.0–100.0)
Monocytes Absolute: 0.4 10*3/uL (ref 0.1–1.0)
Monocytes Relative: 11 %
Neutro Abs: 2.4 10*3/uL (ref 1.7–7.7)
Neutrophils Relative %: 65 %
Platelet Count: 189 10*3/uL (ref 150–400)
RBC: 4.61 MIL/uL (ref 3.87–5.11)
RDW: 13.4 % (ref 11.5–15.5)
WBC Count: 3.7 10*3/uL — ABNORMAL LOW (ref 4.0–10.5)
nRBC: 0 % (ref 0.0–0.2)

## 2023-08-06 LAB — CMP (CANCER CENTER ONLY)
ALT: 18 U/L (ref 0–44)
AST: 21 U/L (ref 15–41)
Albumin: 4.4 g/dL (ref 3.5–5.0)
Alkaline Phosphatase: 97 U/L (ref 38–126)
Anion gap: 7 (ref 5–15)
BUN: 12 mg/dL (ref 8–23)
CO2: 28 mmol/L (ref 22–32)
Calcium: 9.6 mg/dL (ref 8.9–10.3)
Chloride: 107 mmol/L (ref 98–111)
Creatinine: 0.62 mg/dL (ref 0.44–1.00)
GFR, Estimated: >60 mL/min (ref >60)
Glucose, Bld: 100 mg/dL — ABNORMAL HIGH (ref 70–99)
Potassium: 3.6 mmol/L (ref 3.5–5.1)
Sodium: 142 mmol/L (ref 135–145)
Total Bilirubin: 0.6 mg/dL (ref 0.0–1.2)
Total Protein: 7.2 g/dL (ref 6.5–8.1)

## 2023-08-06 LAB — TSH: TSH: 8.48 u[IU]/mL — ABNORMAL HIGH (ref 0.350–4.500)

## 2023-08-06 MED ORDER — PROCHLORPERAZINE MALEATE 10 MG PO TABS
10.0000 mg | ORAL_TABLET | Freq: Once | ORAL | Status: AC
  Administered 2023-08-06: 10 mg via ORAL
  Filled 2023-08-06: qty 1

## 2023-08-06 MED ORDER — SODIUM CHLORIDE 0.9 % IV SOLN
500.0000 mg/m2 | Freq: Once | INTRAVENOUS | Status: AC
Start: 2023-08-06 — End: 2023-08-06
  Administered 2023-08-06: 900 mg via INTRAVENOUS
  Filled 2023-08-06: qty 20

## 2023-08-06 MED ORDER — HEPARIN SOD (PORK) LOCK FLUSH 100 UNIT/ML IV SOLN
500.0000 [IU] | Freq: Once | INTRAVENOUS | Status: AC | PRN
  Administered 2023-08-06: 500 [IU]

## 2023-08-06 MED ORDER — SODIUM CHLORIDE 0.9 % IV SOLN: Freq: Once | INTRAVENOUS | Status: AC

## 2023-08-06 MED ORDER — SODIUM CHLORIDE 0.9% FLUSH
10.0000 mL | Freq: Once | INTRAVENOUS | Status: AC
  Administered 2023-08-06: 10 mL

## 2023-08-06 MED ORDER — DEXAMETHASONE SODIUM PHOSPHATE 10 MG/ML IJ SOLN
10.0000 mg | Freq: Once | INTRAMUSCULAR | Status: AC
  Administered 2023-08-06: 10 mg via INTRAVENOUS
  Filled 2023-08-06: qty 1

## 2023-08-06 MED ORDER — SODIUM CHLORIDE 0.9% FLUSH
10.0000 mL | INTRAVENOUS | Status: DC | PRN
  Administered 2023-08-06: 10 mL

## 2023-08-06 MED ORDER — CYANOCOBALAMIN 1000 MCG/ML IJ SOLN
1000.0000 ug | Freq: Once | INTRAMUSCULAR | Status: AC
  Administered 2023-08-06: 1000 ug via INTRAMUSCULAR
  Filled 2023-08-06: qty 1

## 2023-08-06 NOTE — Patient Instructions (Signed)
 [facility] - A DEPT OF [facility]  Discharge Instructions: Thank you for choosing [facility] to provide your oncology and hematology care.   If you have a lab appointment with the Cancer Center, please go directly to the Cancer Center and check in at the registration area.   Wear comfortable clothing and clothing appropriate for easy access to any Portacath or PICC line.   We strive to give you quality time with your provider. You may need to reschedule your appointment if you arrive late (15 or more minutes).  Arriving late affects you and other patients whose appointments are after yours.  Also, if you miss three or more appointments without notifying the office, you may be dismissed from the clinic at the provider's discretion.      For prescription refill requests, have your pharmacy contact our office and allow 72 hours for refills to be completed.    Today you received the following chemotherapy and/or immunotherapy agents Alimta      To help prevent nausea and vomiting after your treatment, we encourage you to take your nausea medication as directed.  BELOW ARE SYMPTOMS THAT SHOULD BE REPORTED IMMEDIATELY: *FEVER GREATER THAN 100.4 F (38 C) OR HIGHER *CHILLS OR SWEATING *NAUSEA AND VOMITING THAT IS NOT CONTROLLED WITH YOUR NAUSEA MEDICATION *UNUSUAL SHORTNESS OF BREATH *UNUSUAL BRUISING OR BLEEDING *URINARY PROBLEMS (pain or burning when urinating, or frequent urination) *BOWEL PROBLEMS (unusual diarrhea, constipation, pain near the anus) TENDERNESS IN MOUTH AND THROAT WITH OR WITHOUT PRESENCE OF ULCERS (sore throat, sores in mouth, or a toothache) UNUSUAL RASH, SWELLING OR PAIN  UNUSUAL VAGINAL DISCHARGE OR ITCHING   Items with * indicate a potential emergency and should be followed up as soon as possible or go to the Emergency Department if any problems should occur.  Please show the CHEMOTHERAPY ALERT CARD or IMMUNOTHERAPY  ALERT CARD at check-in to the Emergency Department and triage nurse.  Should you have questions after your visit or need to cancel or reschedule your appointment, please contact [facility] - A DEPT OF [facility]  Dept: [phone]  and follow the prompts.  Office hours are 8:00 a.m. to 4:30 p.m. Monday - Friday. Please note that voicemails left after 4:00 p.m. may not be returned until the following business day.  We are closed weekends and major holidays. You have access to a nurse at all times for urgent questions. Please call the main number to the clinic Dept: [phone] and follow the prompts.   For any non-urgent questions, you may also contact your provider using MyChart. We now offer e-Visits for anyone 79 and older to request care online for non-urgent symptoms. For details visit [email or web address].   Also download the MyChart app! Go to the app store, search "MyChart", open the app, select [name], and log in with your MyChart username and password.

## 2023-08-06 NOTE — Progress Notes (Signed)
 [facility] [phone]   Fax:(336) [phone]  OFFICE PROGRESS NOTE  Hayley Hatch, PA-C [address] [place] [address]  DIAGNOSIS: Stage IV (T1b, N2, M1 C) non-small cell lung cancer, adenocarcinoma presented with right lower lobe lung nodule in addition to small bilateral pulmonary nodules and right hilar and mediastinal lymphadenopathy in addition to liver and bone metastasis diagnosed in April 2024. 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   PDL1: 20%   Guardant 360: no actionable mutations.    PRIOR THERAPY: 1) Decompressive thoracic laminectomy of the T7 with spinal cord decompression and biopsy of the epidural mass in April 2024.  2) Palliative radiation to the bone under the care of Dr. Basilio Cairo. Last day scheduled for 10/25/22    CURRENT THERAPY: Carboplatin for an AUC of 5, Alimta 500 mg/m, Keytruda 200 mg IV every 3 weeks.  First dose expected on 10/28/2022. Status post 11 cycles.  Starting from cycle #5 the patient is on maintenance treatment with Alimta and Keytruda every 3 weeks.  Starting from cycle #7 the patient will be treated with single agent Alimta 500 Mg/M2 every 3 weeks.  Rande Lawman was discontinued secondary to suspicious immunotherapy mediated pneumonitis.  INTERVAL HISTORY: Hayley Wu 73 y.o. female returns to the clinic today for follow-up visit, by her sister. 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 She is accompanied by her sister.  Diagnosed with non-small cell lung cancer in April 2024, she initially received chemotherapy with carboplatin, Alimta, and [place]. Due to intolerance to [place], carboplatin was discontinued, and Alimta was continued.  She is currently undergoing maintenance chemotherapy with Alimta every three weeks,  having completed eleven cycles and is scheduled for cycle twelve today. She is doing well with no new complaints since her last visit.  No nausea, vomiting, diarrhea, headaches, weight loss, chest pain, or breathing issues. She is participating in physical therapy, which she finds beneficial.   MEDICAL HISTORY: Past Medical History:  Diagnosis Date   Allergy    Contact lens/glasses fitting    Hypertension     ALLERGIES:  is allergic to erythromycin, amoxicillin, latex, and prednisone.  MEDICATIONS:  Current Outpatient Medications  Medication Sig Dispense Refill   acetaminophen (TYLENOL) 500 MG tablet Take 500 mg by mouth every 6 (six) hours as needed for moderate pain.     amLODipine (NORVASC) 10 MG tablet Take 10 mg by mouth daily.     apixaban (ELIQUIS) 5 MG TABS tablet Take 1 tablet (5 mg total) by mouth 2 (two) times daily. 60 tablet 2   folic acid (FOLVITE) 1 MG tablet Take 1 tablet (1 mg total) by mouth daily. 90 tablet 1   Ipratropium-Albuterol (COMBIVENT) 20-100 MCG/ACT AERS respimat Inhale 1 puff into the lungs every 6 (six) hours as needed for wheezing or shortness of breath. 4 g 1   levothyroxine (SYNTHROID) 112 MCG tablet Take 112 mcg by mouth daily before breakfast.     lidocaine-prilocaine (EMLA) cream Apply 1 Application topically as needed. 30 g 2   mirtazapine (REMERON) 30 MG tablet Take 1 tablet (30 mg total) by mouth at bedtime. 90 tablet 0   potassium chloride SA (KLOR-CON M) 20 MEQ tablet Take 1 tablet (20 mEq total) by mouth daily. (Patient not taking: Reported  on 07/16/2023) 6 tablet 0   prochlorperazine (COMPAZINE) 10 MG tablet Take 1 tablet (10 mg total) by mouth every 6 (six) hours as needed. (Patient taking differently: Take 10 mg by mouth every 6 (six) hours as needed for refractory nausea / vomiting.) 30 tablet 2   VITAMIN D PO Take 1 tablet by mouth daily.     No current facility-administered medications for this visit.    SURGICAL HISTORY:  Past Surgical  History:  Procedure Laterality Date   ABDOMINAL HYSTERECTOMY     BREAST SURGERY  2010   rt lumpectomy-neg   IR IMAGING GUIDED PORT INSERTION  12/18/2022   LAMINECTOMY N/A 09/21/2022   Procedure: THORACIC LAMINECTOMY FOR TUMOR;  Surgeon: Tia Alert, MD;  Location: [facility] OR;  Service: Neurosurgery;  Laterality: N/A;   OPEN REDUCTION INTERNAL FIXATION (ORIF) DISTAL RADIAL FRACTURE Left 11/26/2013   Procedure: OPEN REDUCTION INTERNAL FIXATION (ORIF) LEFT  DISTAL RADIUS ;  Surgeon: Nestor Lewandowsky, MD;  Location: [facility];  Service: Orthopedics;  Laterality: Left;  with block   TUBAL LIGATION      REVIEW OF SYSTEMS:  A comprehensive review of systems was negative.   PHYSICAL EXAMINATION: General appearance: alert, cooperative, and no distress Head: Normocephalic, without obvious abnormality, atraumatic Neck: no adenopathy, no JVD, supple, symmetrical, trachea midline, and thyroid not enlarged, symmetric, no tenderness/mass/nodules Lymph nodes: Cervical, supraclavicular, and axillary nodes normal. Resp: clear to auscultation bilaterally Back: symmetric, no curvature. ROM normal. No CVA tenderness. Cardio: regular rate and rhythm, S1, S2 normal, no murmur, click, rub or gallop GI: soft, non-tender; bowel sounds normal; no masses,  no organomegaly Extremities: extremities normal, atraumatic, no cyanosis or edema  ECOG PERFORMANCE STATUS: 1 - Symptomatic but completely ambulatory  Blood pressure (!) 137/92, pulse (!) 103, temperature 98.3 F (36.8 C), temperature source Temporal, resp. rate 16, height 5\' 3"  (1.6 m), weight 161 lb (73 kg), SpO2 97%.  LABORATORY DATA: Lab Results  Component Value Date   WBC 3.7 (L) 08/06/2023   HGB 13.9 08/06/2023   HCT 42.0 08/06/2023   MCV 91.1 08/06/2023   PLT 189 08/06/2023      Chemistry      Component Value Date/Time   NA 141 07/16/2023 1020   K 3.7 07/16/2023 1020   CL 107 07/16/2023 1020   CO2 28 07/16/2023 1020   BUN 11  07/16/2023 1020   CREATININE 0.65 07/16/2023 1020      Component Value Date/Time   CALCIUM 9.4 07/16/2023 1020   ALKPHOS 92 07/16/2023 1020   AST 21 07/16/2023 1020   ALT 18 07/16/2023 1020   BILITOT 0.5 07/16/2023 1020       RADIOGRAPHIC STUDIES: No results found.  ASSESSMENT AND PLAN: This is a very pleasant 73 years old white female with Stage IV (T1b, N2, M1 C) non-small cell lung cancer, adenocarcinoma presented with right lower lobe lung nodule in addition to small bilateral pulmonary nodules and right hilar and mediastinal lymphadenopathy in addition to liver and bone metastasis diagnosed in April 2024. 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 Molecular studies showed no actionable mutations and PD-L1 expression of 20%. 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  She also had palliative radiation to the bone under the care of Dr. Basilio Cairo. Last day scheduled  for 10/25/22. She is currently on palliative systemic chemotherapy with carboplatin for AUC of 5, Alimta 500 Mg/M2 and Keytruda 200 Mg IV every 3 weeks status post 10 cycles.  Starting from cycle #5 the patient is on maintenance treatment with Alimta and Keytruda every 3 weeks.  After cycle #6 Keytruda was discontinued secondary to suspicious immunotherapy mediated pneumonitis with recent hospitalization and acute respiratory failure.  Stage IV (T1b, N2, M1 C) non-small cell lung cancer, adenocarcinoma presented with right lower lobe lung nodule in addition to small bilateral pulmonary nodules and right hilar and mediastinal lymphadenopathy in addition to liver and bone metastasis diagnosed in April 2024. She is status post decompressive thoracic laminectomy of the T7 with spinal cord decompression and biopsy of the epidural mass in  April 2024. Currently on cycle 12 of Alimta, with a total of 11 cycles completed. Reports no new complaints, nausea, vomiting, diarrhea, headaches, weight loss, chest pain, or breathing issues, indicating good tolerance to the treatment. Treatment is continued as long as it remains effective and side effects are manageable. - Administer cycle 12 of Alimta today - Schedule a scan one week before the next visit to assess treatment efficacy - Follow up in three weeks  Physical Therapy Engaged in physical therapy, which has been beneficial. Plans to transition to outpatient therapy to improve mobility and potentially reduce reliance on a cane. - Transition to outpatient physical therapy The patient voices understanding of current disease status and treatment options and is in agreement with the current care plan.  All questions were answered. The patient knows to call the clinic with any problems, questions or concerns. We can certainly see the patient much sooner if necessary.  The total time spent in the appointment was 20 minutes.  Disclaimer: This note was dictated with voice recognition software. Similar sounding words can inadvertently be transcribed and may not be corrected upon review.

## 2023-08-07 LAB — T4: T4, Total: 12.8 ug/dL — ABNORMAL HIGH (ref 4.5–12.0)

## 2023-08-08 ENCOUNTER — Other Ambulatory Visit: Payer: Self-pay

## 2023-08-09 ENCOUNTER — Other Ambulatory Visit: Payer: Self-pay

## 2023-08-14 ENCOUNTER — Encounter: Payer: Self-pay | Admitting: Physical Therapy

## 2023-08-14 ENCOUNTER — Ambulatory Visit: Attending: Family Medicine | Admitting: Physical Therapy

## 2023-08-14 ENCOUNTER — Other Ambulatory Visit: Payer: Self-pay

## 2023-08-14 DIAGNOSIS — R2681 Unsteadiness on feet: Secondary | ICD-10-CM | POA: Diagnosis not present

## 2023-08-14 DIAGNOSIS — R262 Difficulty in walking, not elsewhere classified: Secondary | ICD-10-CM | POA: Diagnosis not present

## 2023-08-14 DIAGNOSIS — R2689 Other abnormalities of gait and mobility: Secondary | ICD-10-CM | POA: Diagnosis not present

## 2023-08-14 DIAGNOSIS — R29898 Other symptoms and signs involving the musculoskeletal system: Secondary | ICD-10-CM | POA: Insufficient documentation

## 2023-08-14 DIAGNOSIS — M6281 Muscle weakness (generalized): Secondary | ICD-10-CM | POA: Diagnosis not present

## 2023-08-14 NOTE — Therapy (Signed)
 OUTPATIENT PHYSICAL THERAPY NEURO EVALUATION   Patient Name: Hayley Wu MRN: [id number] DOB:03-25-1951, 73 y.o., female Today's Date: 08/14/2023   PCP: [name] REFERRING PROVIDER: Scarlett Presto [name], FNP   END OF SESSION:  PT End of Session - 08/14/23 1025     Visit Number 1    Number of Visits 17    Date for PT Re-Evaluation 10/10/23    Authorization Type [employer]    Progress Note Due on Visit 10    PT Start Time 1020    PT Stop Time 1100    PT Time Calculation (min) 40 min    Equipment Utilized During Treatment Gait belt    Activity Tolerance Patient tolerated treatment well    Behavior During Therapy WFL for tasks assessed/performed             Past Medical History:  Diagnosis Date   Allergy    Contact lens/glasses fitting    Hypertension    Past Surgical History:  Procedure Laterality Date   ABDOMINAL HYSTERECTOMY     BREAST SURGERY  2010   rt lumpectomy-neg   IR IMAGING GUIDED PORT INSERTION  12/18/2022   LAMINECTOMY N/A 09/21/2022   Procedure: THORACIC LAMINECTOMY FOR TUMOR;  Surgeon: Tia Alert, MD;  Location: [facility];  Service: Neurosurgery;  Laterality: N/A;   OPEN REDUCTION INTERNAL FIXATION (ORIF) DISTAL RADIAL FRACTURE Left 11/26/2013   Procedure: OPEN REDUCTION INTERNAL FIXATION (ORIF) LEFT  DISTAL RADIUS ;  Surgeon: Nestor Lewandowsky, MD;  Location: [facility];  Service: Orthopedics;  Laterality: Left;  with block   TUBAL LIGATION     Patient Active Problem List   Diagnosis Date Noted   Acute deep vein thrombosis (DVT) of femoral vein of both lower extremities (HCC) 03/19/2023   Normocytic anemia 03/01/2023   Moderate protein malnutrition (HCC) 03/01/2023   Acute respiratory failure with hypoxia (HCC) 03/01/2023   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   Cardiomegaly 03/01/2023   Pleural effusion due to CHF (congestive heart failure) (HCC) 03/01/2023   Port-A-Cath in place 12/30/2022    Encounter for antineoplastic immunotherapy 11/18/2022   Encounter for antineoplastic chemotherapy 11/18/2022   Constipation 11/05/2022   Goals of care, counseling/discussion 10/21/2022   Cancer, metastatic to bone (HCC) 10/04/2022   Adenocarcinoma of right lung, stage 4 (HCC) 10/01/2022   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 09/20/2022   Hypokalemia 09/20/2022   Lung mass 09/20/2022   Hypothyroidism 09/20/2022   Cholelithiasis 09/20/2022   Lumbar spondylosis 08/26/2022   Thoracic back pain 04/04/2022   Low back pain 04/04/2022   Hypertension 09/23/2011    ONSET DATE: 08/08/2023 (MD order)  REFERRING DIAG:  Z91.81 (ICD-10-CM) - History of falling  C34.91 (ICD-10-CM) - Malignant neoplasm of unspecified part of right bronchus or lung    THERAPY DIAG:  Muscle weakness (generalized)  Other abnormalities of gait and mobility  Unsteadiness on feet  Rationale for Evaluation and Treatment: Rehabilitation  SUBJECTIVE:  SUBJECTIVE STATEMENT: Was hospitalized for >20 days in September>October due to respiratory infection, then was in inpatient rehab.  Have just finished HHPT and now I am walking with a rollator.  No falls in the past 6 months.  Still getting the chemotherapy once every 3 weeks, which just makes me tired.  Sleep in the recliner-not sure why, it's just a habit. Pt accompanied by: self  PERTINENT HISTORY: Stage IV (T1b, N2, M1 C) non-small cell lung cancer, adenocarcinoma presented with right lower lobe lung nodule in addition to small bilateral pulmonary nodules and right hilar and mediastinal lymphadenopathy in addition to liver and bone metastasis  diagnosed in April 2024. 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   PAIN:  Are you having pain? No  PRECAUTIONS: Fall and Other: currently in treatment for lung cancer  RED FLAGS: None   WEIGHT BEARING RESTRICTIONS: No  FALLS: Has patient fallen in last 6 months? No  LIVING ENVIRONMENT: Lives with: lives alone Lives in: House/apartment Stairs: 1 step to get into home Has following equipment at home: Single point cane, Environmental consultant - 4 wheeled, Wheelchair (manual), and shower chair  PLOF: Independent with household mobility with device and Independent with community mobility with device  PATIENT GOALS: Still need to work on balance, endurance, and would like to work on cane more.  OBJECTIVE:  Note: Objective measures were completed at Evaluation unless otherwise noted.  DIAGNOSTIC FINDINGS: NA for this episode  COGNITION: Overall cognitive status: Within functional limits for tasks assessed   SENSATION: Light touch: WFL Sometimes reports pressure/heaviness in feet  MUSCLE TONE: WFL BLEs   POSTURE: rounded shoulders and forward head  LOWER EXTREMITY ROM:   ACTIVE ROM WFL  LOWER EXTREMITY MMT:    MMT Right Eval Left Eval  Hip flexion 3+ 3+  Hip extension    Hip abduction 4 4  Hip adduction 4 4  Hip internal rotation    Hip external rotation    Knee flexion 3+ 4  Knee extension 4 4  Ankle dorsiflexion 3+ 3+  Ankle plantarflexion    Ankle inversion    Ankle eversion    (Blank rows = not tested)    TRANSFERS: Assistive device utilized: Environmental consultant - 4 wheeled  Sit to stand: Modified independence Stand to sit: Modified independence   GAIT: Gait pattern: step through pattern, decreased step length- Right, decreased stride length, and wide BOS Distance walked: 50 ft x 2 Assistive device utilized: Single point cane and Walker - 4 wheeled Level of assistance: SBA  and CGA Comments: Pt brought in her cane (round tip base with SPC)  FUNCTIONAL TESTS:  5 times sit to stand: 13.63 sec arms crossed at chest Timed up and go (TUG): 17.37 with rollator; 20.91 sec with cane  10 meter walk test: 18.97 sec with rollator (1.73 ft/sec); 27.19 sec with cane (1.21 f/tsec) Berg Balance Scale: 32/56  [facility] PT Assessment - 08/14/23 1041       Standardized Balance Assessment   Standardized Balance Assessment Berg Balance Test      Berg Balance Test   Sit to Stand Able to stand  independently using hands    Standing Unsupported Able to stand safely 2 minutes    Sitting with Back Unsupported but Feet Supported on Floor or Stool Able to sit safely and securely 2 minutes    Stand to Sit Sits safely with minimal use of  hands    Transfers Able to transfer safely, minor use of hands    Standing Unsupported with Eyes Closed Able to stand 10 seconds with supervision    Standing Unsupported with Feet Together Able to place feet together independently and stand for 1 minute with supervision    From Standing, Reach Forward with Outstretched Arm Reaches forward but needs supervision   4"   From Standing Position, Pick up Object from Floor Unable to try/needs assist to keep balance    From Standing Position, Turn to Look Behind Over each Shoulder Turn sideways only but maintains balance    Turn 360 Degrees Needs close supervision or verbal cueing   9.94   Standing Unsupported, Alternately Place Feet on Step/Stool Needs assistance to keep from falling or unable to try    Standing Unsupported, One Foot in Front Able to take small step independently and hold 30 seconds    Standing on One Leg Tries to lift leg/unable to hold 3 seconds but remains standing independently    Total Score 32    Berg comment: Scores <45/56 indicate increased fall risk                                                                                                                                           TREATMENT DATE: 08/14/2023    PATIENT EDUCATION: Education details: Eval results, POC, HEP initiated Person educated: Patient Education method: Explanation, Demonstration, and Handouts Education comprehension: verbalized understanding, returned demonstration, and needs further education  HOME EXERCISE PROGRAM: Access Code: [email or web address] URL: [email or web address] Date: 08/14/2023 Prepared by: [facility] - Outpatient  Rehab - [facility]  Exercises - Seated Hamstring Curls with Resistance  - 1 x daily - 5 x weekly - 3 sets - 10 reps - Seated Knee Extension with Resistance  - 1 x daily - 5 x weekly - 3 sets - 10 reps  GOALS: Goals reviewed with patient? Yes  SHORT TERM GOALS: Target date: 09/12/2023  Pt will be independent with HEP for improved strength, balance, gait. Baseline: Goal status: INITIAL  2.  Pt will improve 5x sit<>stand to less than or equal to 12 sec to demonstrate improved functional strength and transfer efficiency. Baseline:  13.63 sec Goal status: INITIAL  3.  Pt will improve Berg score to at least 38/56 to decrease fall risk. Baseline: 32/56 Goal status: INITIAL  4.  Pt will improve gait velocity to at least 2 ft/sec with rollator for improved gait efficiency and safety. Baseline: 1.72 ft/sec Goal status: INITIAL  LONG TERM GOALS: Target date: 10/10/2023  Pt will be independent with progression of HEP for improved strength, balance, gait. Baseline:  Goal status: INITIAL  2.  Pt will improve TUG score to less than or equal to 13.5 sec for decreased fall risk. Baseline: 17.37 sec with rollator; >20 sec cane Goal status: INITIAL  3.  Pt  will improve Berg score to at least 45/56 to decrease fall risk. Baseline: 32/56 Goal status: INITIAL  4.  Pt will improve gait velocity to at least 1.8 ft/sec with cane for improved gait efficiency and safety.  Baseline:  Goal status: INITIAL  5.  Pt will verbalize continued community fitness upon  d/c from PT. Baseline:  Goal status: INITIAL  ASSESSMENT:  CLINICAL IMPRESSION: Patient is a 73 y.o. female who was seen today for physical therapy evaluation and treatment for falls, hx of lung cancer and hx of decompressive thoracic laminectomy of the T7 with spinal cord decompression (09/2022).  She is known to this therapist/clinic from previous bout of therapy, ending in 02/2023, due to pt being hospitalized.  She was hospitalized for >20 days and then went to inpatient rehab, following respiratory infection/reaction to chemo drug.  She has also had HHPT, which she just finished about 2 weeks ago.  At previous bout of therapy, pt was using w/c as primary means of mobility; today pt presents with rollator and wants to be able to transition to cane.  She presents with improved strength (since last bout of therapy), but still decreased BLE strength, decreased functional strength, decreased balance, decreased independence with gait.  She is at fall risk per Berg, TUG, FTSTS and gait velocity scores.  She does report no falls in past 6 months.  She would benefit from skilled PT to address the above stated deficits to decrease fall risk and improve overall improved functional mobility and independence.  OBJECTIVE IMPAIRMENTS: Abnormal gait, decreased balance, decreased mobility, difficulty walking, and decreased strength.   ACTIVITY LIMITATIONS: bending, standing, squatting, transfers, bed mobility, and locomotion level  PARTICIPATION LIMITATIONS: driving, shopping, and community activity  PERSONAL FACTORS: 3+ comorbidities: see above  are also affecting patient's functional outcome.   REHAB POTENTIAL: Good  CLINICAL DECISION MAKING: Evolving/moderate complexity  EVALUATION COMPLEXITY: Moderate  PLAN:  PT FREQUENCY: 2x/week  PT DURATION: 8 weeks plus eval visit  PLANNED INTERVENTIONS: 97110-Therapeutic exercises, 97530- Therapeutic activity, 97112- Neuromuscular re-education, 97535- Self  Care, 82956- Manual therapy, [phone]- Gait training, Patient/Family education, and Balance training  PLAN FOR NEXT SESSION: Review initial HEP (see above + sit to stand and resisted hip abduction from HHPT, per pt report); progress trunk and hip strengthening, gait training and balance to progress towards cane when appropriate   Kierstynn Babich W., PT 08/14/2023, 1:33 PM   Outpatient Rehab at [facility] [address] [place], [place] [address] Phone # [phone] Fax # [phone]

## 2023-08-15 ENCOUNTER — Telehealth: Payer: Self-pay

## 2023-08-15 NOTE — Telephone Encounter (Signed)
 Sent recent lab work to [facility], attn Dahlia Client Stamey.  Faxed confirmed at [phone].

## 2023-08-15 NOTE — Therapy (Signed)
 OUTPATIENT PHYSICAL THERAPY NEURO TREATMENT   Patient Name: Hayley Wu MRN: [id number] DOB:[birth year], 73 y.o., female Today's Date: 08/19/2023   PCP: [name] REFERRING PROVIDER: Scarlett Presto [name], FNP   END OF SESSION:  PT End of Session - 08/19/23 1229     Visit Number 2    Number of Visits 17    Date for PT Re-Evaluation 10/10/23    Authorization Type [employer]    Progress Note Due on Visit 10    PT Start Time 1149    PT Stop Time 1228    PT Time Calculation (min) 39 min    Equipment Utilized During Treatment Gait belt    Activity Tolerance Patient tolerated treatment well    Behavior During Therapy WFL for tasks assessed/performed              Past Medical History:  Diagnosis Date   Allergy    Contact lens/glasses fitting    Hypertension    Past Surgical History:  Procedure Laterality Date   ABDOMINAL HYSTERECTOMY     BREAST SURGERY  2010   rt lumpectomy-neg   IR IMAGING GUIDED PORT INSERTION  12/18/2022   LAMINECTOMY N/A 09/21/2022   Procedure: THORACIC LAMINECTOMY FOR TUMOR;  Surgeon: Tia Alert, MD;  Location: [facility] OR;  Service: Neurosurgery;  Laterality: N/A;   OPEN REDUCTION INTERNAL FIXATION (ORIF) DISTAL RADIAL FRACTURE Left 11/26/2013   Procedure: OPEN REDUCTION INTERNAL FIXATION (ORIF) LEFT  DISTAL RADIUS ;  Surgeon: Nestor Lewandowsky, MD;  Location: [facility];  Service: Orthopedics;  Laterality: Left;  with block   TUBAL LIGATION     Patient Active Problem List   Diagnosis Date Noted   Acute deep vein thrombosis (DVT) of femoral vein of both lower extremities (HCC) 03/19/2023   Normocytic anemia 03/01/2023   Moderate protein malnutrition (HCC) 03/01/2023   Acute respiratory failure with hypoxia (HCC) 03/01/2023   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   Cardiomegaly 03/01/2023   Pleural effusion due to CHF (congestive heart failure) (HCC) 03/01/2023   Port-A-Cath in place 12/30/2022    Encounter for antineoplastic immunotherapy 11/18/2022   Encounter for antineoplastic chemotherapy 11/18/2022   Constipation 11/05/2022   Goals of care, counseling/discussion 10/21/2022   Cancer, metastatic to bone (HCC) 10/04/2022   Adenocarcinoma of right lung, stage 4 (HCC) 10/01/2022   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 09/20/2022   Hypokalemia 09/20/2022   Lung mass 09/20/2022   Hypothyroidism 09/20/2022   Cholelithiasis 09/20/2022   Lumbar spondylosis 08/26/2022   Thoracic back pain 04/04/2022   Low back pain 04/04/2022   Hypertension 09/23/2011    ONSET DATE: 08/08/2023 (MD order)  REFERRING DIAG:  Z91.81 (ICD-10-CM) - History of falling  C34.91 (ICD-10-CM) - Malignant neoplasm of unspecified part of right bronchus or lung    THERAPY DIAG:  Muscle weakness (generalized)  Other abnormalities of gait and mobility  Unsteadiness on feet  Other symptoms and signs involving the musculoskeletal system  Difficulty in walking, not elsewhere classified  Rationale for Evaluation and Treatment: Rehabilitation  SUBJECTIVE:  SUBJECTIVE STATEMENT: Nothing new. Reports that she has not started her HEP- had company.   Pt accompanied by: self  PERTINENT HISTORY: Stage IV (T1b, N2, M1 C) non-small cell lung cancer, adenocarcinoma presented with right lower lobe lung nodule in addition to small bilateral pulmonary nodules and right hilar and mediastinal lymphadenopathy in addition to liver and bone metastasis diagnosed in April 2024. She is status post decompressive thoracic laminectomy of the T7 with spinal cord decompression and biopsy of the epidural mass in  April 2024.   PAIN:  Are you having pain? No  PRECAUTIONS: Fall and Other: currently in treatment for lung cancer  RED FLAGS: None   WEIGHT BEARING RESTRICTIONS: No  FALLS: Has patient fallen in last 6 months? No  LIVING ENVIRONMENT: Lives with: lives alone Lives in: House/apartment Stairs: 1 step to get into home Has following equipment at home: Single point cane, Environmental consultant - 4 wheeled, Wheelchair (manual), and shower chair  PLOF: Independent with household mobility with device and Independent with community mobility with device  PATIENT GOALS: Still need to work on balance, endurance, and would like to work on cane more.  OBJECTIVE:      TODAY'S TREATMENT: 08/19/23 Activity Comments  Review of initial HEP: sitting HS curl  green TB 10x  sitting LAQ red TB 10x  STS with walker in front 10x  resisted hip abduction from HHPT (sitting clam with red TB) 10x  Cueing to slow down; edu on how to secure TB for LAQ, cues for glute squeeze with sit to stand, control with stand to sit.   Gait training with 4WW, then pt's hurrycane CGA; good heel-toe pattern on R foot, some L toe out; cueing to elongate R foot steps   Palpation  TPT and tight over L proximal biceps tendon and anterior delt  romberg + head turns/nods  CGA d/t mild-mod sway  fwd/back stepping  Cueing for longer step and increased wt shift             HOME EXERCISE PROGRAM: Access Code: [email or web address] URL: [email or web address] Date: 08/19/2023 Prepared by: [facility] - Outpatient  Rehab - [facility]  Program Notes practice walking inside the house with your cane for 5 minutes per day.   Exercises - Seated Hamstring Curls with Resistance  - 1 x daily - 5 x weekly - 3 sets - 10 reps - Seated Knee Extension with Resistance  - 1 x daily - 5 x weekly - 3 sets - 10 reps - Sit to Stand with Counter Support  - 1 x daily - 5 x weekly - 2 sets - 10 reps - Seated Hip Abduction with Resistance  - 1 x daily -  5 x weekly - 2 sets - 10 reps   PATIENT EDUCATION: Education details: HEP update, encouraged practicing gait with cane inside the house with short distances; edu on how posture influences shoulder biomechanics  Person educated: Patient Education method: Explanation, Demonstration, Tactile cues, Verbal cues, and Handouts Education comprehension: verbalized understanding and returned demonstration        Note: Objective measures were completed at Evaluation unless otherwise noted.  DIAGNOSTIC FINDINGS: NA for this episode  COGNITION: Overall cognitive status: Within functional limits for tasks assessed   SENSATION: Light touch: WFL Sometimes reports pressure/heaviness in feet  MUSCLE TONE: WFL BLEs   POSTURE: rounded shoulders and forward head  LOWER EXTREMITY ROM:   ACTIVE ROM WFL  LOWER EXTREMITY MMT:    MMT Right Eval Left Eval  Hip flexion 3+ 3+  Hip extension    Hip abduction 4 4  Hip adduction 4 4  Hip internal rotation    Hip external rotation    Knee flexion 3+ 4  Knee extension 4 4  Ankle dorsiflexion 3+ 3+  Ankle plantarflexion    Ankle inversion    Ankle eversion    (Blank rows = not tested)    TRANSFERS: Assistive device utilized: Environmental consultant - 4 wheeled  Sit to stand: Modified independence Stand to sit: Modified independence   GAIT: Gait pattern: step through pattern, decreased step length- Right, decreased stride length, and wide BOS Distance walked: 50 ft x 2 Assistive device utilized: Single point cane and Walker - 4 wheeled Level of assistance: SBA and CGA Comments: Pt brought in her cane (round tip base with SPC)  FUNCTIONAL TESTS:  5 times sit to stand: 13.63 sec arms crossed at chest Timed up and go (TUG): 17.37 with rollator; 20.91 sec with cane  10 meter walk test: 18.97 sec with rollator (1.73 ft/sec); 27.19 sec with cane (1.21 f/tsec) Berg Balance Scale: 32/56                                                                                                                                  TREATMENT DATE: 08/14/2023    PATIENT EDUCATION: Education details: Eval results, POC, HEP initiated Person educated: Patient Education method: Explanation, Demonstration, and Handouts Education comprehension: verbalized understanding, returned demonstration, and needs further education  HOME EXERCISE PROGRAM: Access Code: [email or web address] URL: [email or web address] Date: 08/14/2023 Prepared by: [facility] - Outpatient  Rehab - [facility]  Exercises - Seated Hamstring Curls with Resistance  - 1 x daily - 5 x weekly - 3 sets - 10 reps - Seated Knee Extension with Resistance  - 1 x daily - 5 x weekly - 3 sets - 10 reps  GOALS: Goals reviewed with patient? Yes  SHORT TERM GOALS: Target date: 09/12/2023  Pt will be independent with HEP for improved strength, balance, gait. Baseline: Goal status: IN PROGRESS  2.  Pt will improve 5x sit<>stand to less than or equal to 12 sec to demonstrate improved functional strength and transfer efficiency. Baseline:  13.63 sec Goal status: IN PROGRESS  3.  Pt will improve Berg score to at least 38/56 to decrease fall risk. Baseline: 32/56 Goal status: IN PROGRESS  4.  Pt will improve gait velocity to at least 2 ft/sec with rollator for improved gait efficiency and safety. Baseline: 1.72 ft/sec Goal status: IN PROGRESS  LONG TERM GOALS: Target date: 10/10/2023  Pt will be independent with progression of HEP for improved strength, balance, gait. Baseline:  Goal status: IN PROGRESS  2.  Pt will improve TUG score to less than or equal to 13.5 sec for decreased fall risk. Baseline: 17.37 sec with rollator; >20 sec cane Goal status: IN PROGRESS  3.  Pt will improve Berg score to at  least 45/56 to decrease fall risk. Baseline: 32/56 Goal status: IN PROGRESS  4.  Pt will improve gait velocity to at least 1.8 ft/sec with cane for improved gait efficiency and safety.   Baseline:  Goal status: IN PROGRESS  5.  Pt will verbalize continued community fitness upon d/c from PT. Baseline:  Goal status: IN PROGRESS  ASSESSMENT:  CLINICAL IMPRESSION: Patient arrived to session without new complaints. Reported HEP noncompliance; this was reviewed for max understanding, providing cues for proper form; patient tolerated this well. Gait training revealed good stability with use of 4WW and pt's cane. Patient responded well to focus with gait training, thus encouraged short distance gait with cane inside the home. Proceeded with additional balance challenges including narrow BOS and stepping strategy. Patient tolerated session well and without complaints upon leaving.   OBJECTIVE IMPAIRMENTS: Abnormal gait, decreased balance, decreased mobility, difficulty walking, and decreased strength.   ACTIVITY LIMITATIONS: bending, standing, squatting, transfers, bed mobility, and locomotion level  PARTICIPATION LIMITATIONS: driving, shopping, and community activity  PERSONAL FACTORS: 3+ comorbidities: see above  are also affecting patient's functional outcome.   REHAB POTENTIAL: Good  CLINICAL DECISION MAKING: Evolving/moderate complexity  EVALUATION COMPLEXITY: Moderate  PLAN:  PT FREQUENCY: 2x/week  PT DURATION: 8 weeks plus eval visit  PLANNED INTERVENTIONS: 97110-Therapeutic exercises, 97530- Therapeutic activity, 97112- Neuromuscular re-education, 97535- Self Care, 13086- Manual therapy, [phone]- Gait training, Patient/Family education, and Balance training  PLAN FOR NEXT SESSION: progress trunk and hip strengthening, gait training and balance to progress towards cane when appropriate  Baldemar Friday, PT, DPT 08/19/23 12:32 PM  [facility] [address], [address] [place], [place] [address] Phone # [phone] Fax # [phone]

## 2023-08-19 ENCOUNTER — Encounter: Payer: Self-pay | Admitting: Physical Therapy

## 2023-08-19 ENCOUNTER — Ambulatory Visit: Admitting: Physical Therapy

## 2023-08-19 DIAGNOSIS — R2689 Other abnormalities of gait and mobility: Secondary | ICD-10-CM

## 2023-08-19 DIAGNOSIS — M6281 Muscle weakness (generalized): Secondary | ICD-10-CM

## 2023-08-19 DIAGNOSIS — R262 Difficulty in walking, not elsewhere classified: Secondary | ICD-10-CM

## 2023-08-19 DIAGNOSIS — R29898 Other symptoms and signs involving the musculoskeletal system: Secondary | ICD-10-CM

## 2023-08-19 DIAGNOSIS — R2681 Unsteadiness on feet: Secondary | ICD-10-CM

## 2023-08-20 ENCOUNTER — Ambulatory Visit (HOSPITAL_COMMUNITY)
Admission: RE | Admit: 2023-08-20 | Discharge: 2023-08-20 | Disposition: A | Discharge status: Home / Self Care | Payer: PPO | Source: Ambulatory Visit | Attending: Internal Medicine | Admitting: Internal Medicine

## 2023-08-20 ENCOUNTER — Other Ambulatory Visit: Payer: Self-pay | Admitting: Internal Medicine

## 2023-08-20 DIAGNOSIS — J439 Emphysema, unspecified: Secondary | ICD-10-CM | POA: Diagnosis not present

## 2023-08-20 DIAGNOSIS — K828 Other specified diseases of gallbladder: Secondary | ICD-10-CM | POA: Diagnosis not present

## 2023-08-20 DIAGNOSIS — K8020 Calculus of gallbladder without cholecystitis without obstruction: Secondary | ICD-10-CM | POA: Diagnosis not present

## 2023-08-20 DIAGNOSIS — C3490 Malignant neoplasm of unspecified part of unspecified bronchus or lung: Secondary | ICD-10-CM

## 2023-08-20 DIAGNOSIS — I3139 Other pericardial effusion (noninflammatory): Secondary | ICD-10-CM | POA: Diagnosis not present

## 2023-08-20 MED ORDER — HEPARIN SOD (PORK) LOCK FLUSH 100 UNIT/ML IV SOLN
500.0000 [IU] | Freq: Once | INTRAVENOUS | Status: AC
  Administered 2023-08-20: 500 [IU] via INTRAVENOUS

## 2023-08-20 MED ORDER — IOHEXOL 300 MG/ML  SOLN
100.0000 mL | Freq: Once | INTRAMUSCULAR | Status: AC | PRN
Start: 2023-08-20 — End: 2023-08-20
  Administered 2023-08-20: 100 mL via INTRAVENOUS

## 2023-08-21 NOTE — Therapy (Signed)
 OUTPATIENT PHYSICAL THERAPY NEURO TREATMENT   Patient Name: Hayley Wu MRN: [id number] DOB:[birth year], 73 y.o., female Today's Date: 08/22/2023   PCP: [name] REFERRING PROVIDER: Scarlett Presto [name], FNP   END OF SESSION:  PT End of Session - 08/22/23 1058     Visit Number 3    Number of Visits 17    Date for PT Re-Evaluation 10/10/23    Authorization Type [employer]    Progress Note Due on Visit 10    PT Start Time 1020    PT Stop Time 1058    PT Time Calculation (min) 38 min    Equipment Utilized During Treatment Gait belt    Activity Tolerance Patient tolerated treatment well    Behavior During Therapy WFL for tasks assessed/performed               Past Medical History:  Diagnosis Date   Allergy    Contact lens/glasses fitting    Hypertension    Past Surgical History:  Procedure Laterality Date   ABDOMINAL HYSTERECTOMY     BREAST SURGERY  2010   rt lumpectomy-neg   IR IMAGING GUIDED PORT INSERTION  12/18/2022   LAMINECTOMY N/A 09/21/2022   Procedure: THORACIC LAMINECTOMY FOR TUMOR;  Surgeon: Tia Alert, MD;  Location: [facility] OR;  Service: Neurosurgery;  Laterality: N/A;   OPEN REDUCTION INTERNAL FIXATION (ORIF) DISTAL RADIAL FRACTURE Left 11/26/2013   Procedure: OPEN REDUCTION INTERNAL FIXATION (ORIF) LEFT  DISTAL RADIUS ;  Surgeon: Nestor Lewandowsky, MD;  Location: [facility];  Service: Orthopedics;  Laterality: Left;  with block   TUBAL LIGATION     Patient Active Problem List   Diagnosis Date Noted   Acute deep vein thrombosis (DVT) of femoral vein of both lower extremities (HCC) 03/19/2023   Normocytic anemia 03/01/2023   Moderate protein malnutrition (HCC) 03/01/2023   Acute respiratory failure with hypoxia (HCC) 03/01/2023   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   Cardiomegaly 03/01/2023   Pleural effusion due to CHF (congestive heart failure) (HCC) 03/01/2023   Port-A-Cath in place 12/30/2022    Encounter for antineoplastic immunotherapy 11/18/2022   Encounter for antineoplastic chemotherapy 11/18/2022   Constipation 11/05/2022   Goals of care, counseling/discussion 10/21/2022   Cancer, metastatic to bone (HCC) 10/04/2022   Adenocarcinoma of right lung, stage 4 (HCC) 10/01/2022   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 09/20/2022   Hypokalemia 09/20/2022   Lung mass 09/20/2022   Hypothyroidism 09/20/2022   Cholelithiasis 09/20/2022   Lumbar spondylosis 08/26/2022   Thoracic back pain 04/04/2022   Low back pain 04/04/2022   Hypertension 09/23/2011    ONSET DATE: 08/08/2023 (MD order)  REFERRING DIAG:  Z91.81 (ICD-10-CM) - History of falling  C34.91 (ICD-10-CM) - Malignant neoplasm of unspecified part of right bronchus or lung    THERAPY DIAG:  Muscle weakness (generalized)  Other abnormalities of gait and mobility  Unsteadiness on feet  Other symptoms and signs involving the musculoskeletal system  Difficulty in walking, not elsewhere classified  Rationale for Evaluation and Treatment: Rehabilitation  SUBJECTIVE:  SUBJECTIVE STATEMENT: Nothing new. Reports that she is gaining confidence walking indoors with the cane.   Pt accompanied by: self  PERTINENT HISTORY: Stage IV (T1b, N2, M1 C) non-small cell lung cancer, adenocarcinoma presented with right lower lobe lung nodule in addition to small bilateral pulmonary nodules and right hilar and mediastinal lymphadenopathy in addition to liver and bone metastasis diagnosed in April 2024. She is status post decompressive thoracic laminectomy of the T7 with spinal cord decompression and biopsy of the  epidural mass in April 2024.   PAIN:  Are you having pain? No  PRECAUTIONS: Fall and Other: currently in treatment for lung cancer  RED FLAGS: None   WEIGHT BEARING RESTRICTIONS: No  FALLS: Has patient fallen in last 6 months? No  LIVING ENVIRONMENT: Lives with: lives alone Lives in: House/apartment Stairs: 1 step to get into home Has following equipment at home: Single point cane, Environmental consultant - 4 wheeled, Wheelchair (manual), and shower chair  PLOF: Independent with household mobility with device and Independent with community mobility with device  PATIENT GOALS: Still need to work on balance, endurance, and would like to work on cane more.  OBJECTIVE:     TODAY'S TREATMENT: 08/22/23 Activity Comments  gait training with cane with head turns, figure 8 turns Occasional sitting rest breaks d/t c/o LE muscle fatigue; CGA and cueing for longer step; good sequencing with cane   stair navigation with cane  Cueing for proper sequencing with L LE leading; pt requires CGA-min A and 1 UE support descending   Step ups 10x each LE 6" step Weaned to 1 UE support   stepping to multidirectional targets on floor 1 UE support; required cues to relax shoulders; short steps   Backwards walking  In II bars; requires 1 UE support               HOME EXERCISE PROGRAM: Access Code: [email or web address] URL: [email or web address] Date: 08/19/2023 Prepared by: [facility] - Outpatient  Rehab - [facility]  Program Notes practice walking inside the house with your cane for 5 minutes per day.   Exercises - Seated Hamstring Curls with Resistance  - 1 x daily - 5 x weekly - 3 sets - 10 reps - Seated Knee Extension with Resistance  - 1 x daily - 5 x weekly - 3 sets - 10 reps - Sit to Stand with Counter Support  - 1 x daily - 5 x weekly - 2 sets - 10 reps - Seated Hip Abduction with Resistance  - 1 x daily - 5 x weekly - 2 sets - 10 reps   PATIENT EDUCATION: Education details: HEP update,  encouraged practicing gait with cane inside the house with short distances; edu on how posture influences shoulder biomechanics  Person educated: Patient Education method: Explanation, Demonstration, Tactile cues, Verbal cues, and Handouts Education comprehension: verbalized understanding and returned demonstration        Note: Objective measures were completed at Evaluation unless otherwise noted.  DIAGNOSTIC FINDINGS: NA for this episode  COGNITION: Overall cognitive status: Within functional limits for tasks assessed   SENSATION: Light touch: WFL Sometimes reports pressure/heaviness in feet  MUSCLE TONE: WFL BLEs   POSTURE: rounded shoulders and forward head  LOWER EXTREMITY ROM:   ACTIVE ROM WFL  LOWER EXTREMITY MMT:    MMT Right Eval Left Eval  Hip flexion 3+ 3+  Hip extension    Hip abduction 4 4  Hip adduction 4 4  Hip internal rotation  Hip external rotation    Knee flexion 3+ 4  Knee extension 4 4  Ankle dorsiflexion 3+ 3+  Ankle plantarflexion    Ankle inversion    Ankle eversion    (Blank rows = not tested)    TRANSFERS: Assistive device utilized: Environmental consultant - 4 wheeled  Sit to stand: Modified independence Stand to sit: Modified independence   GAIT: Gait pattern: step through pattern, decreased step length- Right, decreased stride length, and wide BOS Distance walked: 50 ft x 2 Assistive device utilized: Single point cane and Walker - 4 wheeled Level of assistance: SBA and CGA Comments: Pt brought in her cane (round tip base with SPC)  FUNCTIONAL TESTS:  5 times sit to stand: 13.63 sec arms crossed at chest Timed up and go (TUG): 17.37 with rollator; 20.91 sec with cane  10 meter walk test: 18.97 sec with rollator (1.73 ft/sec); 27.19 sec with cane (1.21 f/tsec) Berg Balance Scale: 32/56                                                                                                                                 TREATMENT DATE:  08/14/2023    PATIENT EDUCATION: Education details: Eval results, POC, HEP initiated Person educated: Patient Education method: Explanation, Demonstration, and Handouts Education comprehension: verbalized understanding, returned demonstration, and needs further education  HOME EXERCISE PROGRAM: Access Code: [email or web address] URL: [email or web address] Date: 08/14/2023 Prepared by: [facility] - Outpatient  Rehab - [facility]  Exercises - Seated Hamstring Curls with Resistance  - 1 x daily - 5 x weekly - 3 sets - 10 reps - Seated Knee Extension with Resistance  - 1 x daily - 5 x weekly - 3 sets - 10 reps  GOALS: Goals reviewed with patient? Yes  SHORT TERM GOALS: Target date: 09/12/2023  Pt will be independent with HEP for improved strength, balance, gait. Baseline: Goal status: IN PROGRESS  2.  Pt will improve 5x sit<>stand to less than or equal to 12 sec to demonstrate improved functional strength and transfer efficiency. Baseline:  13.63 sec Goal status: IN PROGRESS  3.  Pt will improve Berg score to at least 38/56 to decrease fall risk. Baseline: 32/56 Goal status: IN PROGRESS  4.  Pt will improve gait velocity to at least 2 ft/sec with rollator for improved gait efficiency and safety. Baseline: 1.72 ft/sec Goal status: IN PROGRESS  LONG TERM GOALS: Target date: 10/10/2023  Pt will be independent with progression of HEP for improved strength, balance, gait. Baseline:  Goal status: IN PROGRESS  2.  Pt will improve TUG score to less than or equal to 13.5 sec for decreased fall risk. Baseline: 17.37 sec with rollator; >20 sec cane Goal status: IN PROGRESS  3.  Pt will improve Berg score to at least 45/56 to decrease fall risk. Baseline: 32/56 Goal status: IN PROGRESS  4.  Pt will improve gait velocity to at least 1.8 ft/sec with  cane for improved gait efficiency and safety.  Baseline:  Goal status: IN PROGRESS  5.  Pt will verbalize continued community  fitness upon d/c from PT. Baseline:  Goal status: IN PROGRESS  ASSESSMENT:  CLINICAL IMPRESSION:  Patient arrived to session without new complaints. Patient does get fatigued with gait training with additional balance challenges. Some remaining hesitancy evident with gait. Practiced stair navigation- patient requires 1 UE support when descending d/t imbalance. Patient reports some concerns about getting up from the floor; will work towards this in coming sessions.   OBJECTIVE IMPAIRMENTS: Abnormal gait, decreased balance, decreased mobility, difficulty walking, and decreased strength.   ACTIVITY LIMITATIONS: bending, standing, squatting, transfers, bed mobility, and locomotion level  PARTICIPATION LIMITATIONS: driving, shopping, and community activity  PERSONAL FACTORS: 3+ comorbidities: see above  are also affecting patient's functional outcome.   REHAB POTENTIAL: Good  CLINICAL DECISION MAKING: Evolving/moderate complexity  EVALUATION COMPLEXITY: Moderate  PLAN:  PT FREQUENCY: 2x/week  PT DURATION: 8 weeks plus eval visit  PLANNED INTERVENTIONS: 97110-Therapeutic exercises, 97530- Therapeutic activity, 97112- Neuromuscular re-education, 97535- Self Care, 40981- Manual therapy, [phone]- Gait training, Patient/Family education, and Balance training  PLAN FOR NEXT SESSION: progress trunk and hip strengthening, gait training and balance to progress towards cane when appropriate  Baldemar Friday, PT, DPT 08/22/23 11:00 AM  [facility] at [facility] [address], [address] [place], [place] [address] Phone # [phone] Fax # [phone]

## 2023-08-22 ENCOUNTER — Encounter: Payer: Self-pay | Admitting: Physical Therapy

## 2023-08-22 ENCOUNTER — Ambulatory Visit: Admitting: Physical Therapy

## 2023-08-22 DIAGNOSIS — R29898 Other symptoms and signs involving the musculoskeletal system: Secondary | ICD-10-CM

## 2023-08-22 DIAGNOSIS — R2689 Other abnormalities of gait and mobility: Secondary | ICD-10-CM

## 2023-08-22 DIAGNOSIS — R2681 Unsteadiness on feet: Secondary | ICD-10-CM

## 2023-08-22 DIAGNOSIS — M6281 Muscle weakness (generalized): Secondary | ICD-10-CM | POA: Diagnosis not present

## 2023-08-22 DIAGNOSIS — R262 Difficulty in walking, not elsewhere classified: Secondary | ICD-10-CM

## 2023-08-23 DIAGNOSIS — R918 Other nonspecific abnormal finding of lung field: Secondary | ICD-10-CM | POA: Diagnosis not present

## 2023-08-25 NOTE — Therapy (Incomplete)
 OUTPATIENT PHYSICAL THERAPY NEURO TREATMENT   Patient Name: Hayley Wu MRN: [id number] DOB:August 02, 1950, 73 y.o., female Today's Date: 08/25/2023   PCP: [name] REFERRING PROVIDER: Stamey, [name], FNP   END OF SESSION:      Past Medical History:  Diagnosis Date   Allergy    Contact lens/glasses fitting    Hypertension    Past Surgical History:  Procedure Laterality Date   ABDOMINAL HYSTERECTOMY     BREAST SURGERY  2010   rt lumpectomy-neg   IR IMAGING GUIDED PORT INSERTION  12/18/2022   LAMINECTOMY N/A 09/21/2022   Procedure: THORACIC LAMINECTOMY FOR TUMOR;  Surgeon: Tia Alert, MD;  Location: [facility] OR;  Service: Neurosurgery;  Laterality: N/A;   OPEN REDUCTION INTERNAL FIXATION (ORIF) DISTAL RADIAL FRACTURE Left 11/26/2013   Procedure: OPEN REDUCTION INTERNAL FIXATION (ORIF) LEFT  DISTAL RADIUS ;  Surgeon: Nestor Lewandowsky, MD;  Location: [facility];  Service: Orthopedics;  Laterality: Left;  with block   TUBAL LIGATION     Patient Active Problem List   Diagnosis Date Noted   Acute deep vein thrombosis (DVT) of femoral vein of both lower extremities (HCC) 03/19/2023   Normocytic anemia 03/01/2023   Moderate protein malnutrition (HCC) 03/01/2023   Acute respiratory failure with hypoxia (HCC) 03/01/2023   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   Cardiomegaly 03/01/2023   Pleural effusion due to CHF (congestive heart failure) (HCC) 03/01/2023   Port-A-Cath in place 12/30/2022   Encounter for antineoplastic immunotherapy 11/18/2022   Encounter for antineoplastic chemotherapy 11/18/2022   Constipation 11/05/2022   Goals of care, counseling/discussion 10/21/2022   Cancer, metastatic to bone (HCC) 10/04/2022   Adenocarcinoma of right lung, stage 4 (HCC) 10/01/2022   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 09/20/2022   Hypokalemia 09/20/2022   Lung mass 09/20/2022   Hypothyroidism 09/20/2022   Cholelithiasis 09/20/2022   Lumbar spondylosis 08/26/2022   Thoracic back pain 04/04/2022   Low back pain 04/04/2022   Hypertension 09/23/2011    ONSET DATE: 08/08/2023 (MD order)  REFERRING DIAG:  Z91.81 (ICD-10-CM) - History of falling  C34.91 (ICD-10-CM) - Malignant neoplasm of unspecified part of right bronchus or lung    THERAPY DIAG:  No diagnosis found.  Rationale for Evaluation and Treatment: Rehabilitation  SUBJECTIVE:  SUBJECTIVE STATEMENT: Nothing new. Reports that she is gaining confidence walking indoors with the cane.   Pt accompanied by: self  PERTINENT HISTORY: Stage IV (T1b, N2, M1 C) non-small cell lung cancer, adenocarcinoma presented with right lower lobe lung nodule in addition to small bilateral pulmonary nodules and right hilar and mediastinal lymphadenopathy in addition to liver and bone metastasis diagnosed in April 2024. 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   PAIN:  Are you having pain? No  PRECAUTIONS: Fall and Other: currently in treatment for lung cancer  RED FLAGS: None   WEIGHT BEARING RESTRICTIONS: No  FALLS: Has patient fallen in last 6 months? No  LIVING ENVIRONMENT: Lives with: lives alone Lives in: House/apartment Stairs: 1 step to get into home Has following equipment at home: Single point cane, Environmental consultant - 4 wheeled, Wheelchair (manual), and shower chair  PLOF: Independent with household mobility with device and Independent with community mobility with device  PATIENT GOALS: Still need to work on balance, endurance, and would like to work  on cane more.  OBJECTIVE:      TODAY'S TREATMENT: 08/26/23 Activity Comments                         TODAY'S TREATMENT: 08/22/23 Activity Comments  gait training with cane with head turns, figure 8 turns Occasional sitting rest breaks d/t c/o LE muscle fatigue; CGA and cueing for longer step; good sequencing with cane   stair navigation with cane  Cueing for proper sequencing with L LE leading; pt requires CGA-min A and 1 UE support descending   Step ups 10x each LE 6" step Weaned to 1 UE support   stepping to multidirectional targets on floor 1 UE support; required cues to relax shoulders; short steps   Backwards walking  In II bars; requires 1 UE support               HOME EXERCISE PROGRAM: Access Code: [email or web address] URL: [email or web address] Date: 08/19/2023 Prepared by: [facility] - Outpatient  Rehab - [facility]  Program Notes practice walking inside the house with your cane for 5 minutes per day.   Exercises - Seated Hamstring Curls with Resistance  - 1 x daily - 5 x weekly - 3 sets - 10 reps - Seated Knee Extension with Resistance  - 1 x daily - 5 x weekly - 3 sets - 10 reps - Sit to Stand with Counter Support  - 1 x daily - 5 x weekly - 2 sets - 10 reps - Seated Hip Abduction with Resistance  - 1 x daily - 5 x weekly - 2 sets - 10 reps      Note: Objective measures were completed at Evaluation unless otherwise noted.  DIAGNOSTIC FINDINGS: NA for this episode  COGNITION: Overall cognitive status: Within functional limits for tasks assessed   SENSATION: Light touch: WFL Sometimes reports pressure/heaviness in feet  MUSCLE TONE: WFL BLEs   POSTURE: rounded shoulders and forward head  LOWER EXTREMITY ROM:   ACTIVE ROM WFL  LOWER EXTREMITY MMT:    MMT Right Eval Left Eval  Hip flexion 3+ 3+  Hip extension    Hip abduction 4 4  Hip adduction 4 4  Hip internal rotation    Hip external rotation    Knee flexion 3+ 4  Knee  extension 4 4  Ankle dorsiflexion 3+  3+  Ankle plantarflexion    Ankle inversion    Ankle eversion    (Blank rows = not tested)    TRANSFERS: Assistive device utilized: Environmental consultant - 4 wheeled  Sit to stand: Modified independence Stand to sit: Modified independence   GAIT: Gait pattern: step through pattern, decreased step length- Right, decreased stride length, and wide BOS Distance walked: 50 ft x 2 Assistive device utilized: Single point cane and Walker - 4 wheeled Level of assistance: SBA and CGA Comments: Pt brought in her cane (round tip base with SPC)  FUNCTIONAL TESTS:  5 times sit to stand: 13.63 sec arms crossed at chest Timed up and go (TUG): 17.37 with rollator; 20.91 sec with cane  10 meter walk test: 18.97 sec with rollator (1.73 ft/sec); 27.19 sec with cane (1.21 f/tsec) Berg Balance Scale: 32/56                                                                                                                                 TREATMENT DATE: 08/14/2023    PATIENT EDUCATION: Education details: Eval results, POC, HEP initiated Person educated: Patient Education method: Explanation, Demonstration, and Handouts Education comprehension: verbalized understanding, returned demonstration, and needs further education  HOME EXERCISE PROGRAM: Access Code: [email or web address] URL: [email or web address] Date: 08/14/2023 Prepared by: [facility] - Outpatient  Rehab - [facility]  Exercises - Seated Hamstring Curls with Resistance  - 1 x daily - 5 x weekly - 3 sets - 10 reps - Seated Knee Extension with Resistance  - 1 x daily - 5 x weekly - 3 sets - 10 reps  GOALS: Goals reviewed with patient? Yes  SHORT TERM GOALS: Target date: 09/12/2023  Pt will be independent with HEP for improved strength, balance, gait. Baseline: Goal status: IN PROGRESS  2.  Pt will improve 5x sit<>stand to less than or equal to 12 sec to demonstrate improved functional strength and  transfer efficiency. Baseline:  13.63 sec Goal status: IN PROGRESS  3.  Pt will improve Berg score to at least 38/56 to decrease fall risk. Baseline: 32/56 Goal status: IN PROGRESS  4.  Pt will improve gait velocity to at least 2 ft/sec with rollator for improved gait efficiency and safety. Baseline: 1.72 ft/sec Goal status: IN PROGRESS  LONG TERM GOALS: Target date: 10/10/2023  Pt will be independent with progression of HEP for improved strength, balance, gait. Baseline:  Goal status: IN PROGRESS  2.  Pt will improve TUG score to less than or equal to 13.5 sec for decreased fall risk. Baseline: 17.37 sec with rollator; >20 sec cane Goal status: IN PROGRESS  3.  Pt will improve Berg score to at least 45/56 to decrease fall risk. Baseline: 32/56 Goal status: IN PROGRESS  4.  Pt will improve gait velocity to at least 1.8 ft/sec with cane for improved gait efficiency and safety.  Baseline:  Goal status: IN PROGRESS  5.  Pt will  verbalize continued community fitness upon d/c from PT. Baseline:  Goal status: IN PROGRESS  ASSESSMENT:  CLINICAL IMPRESSION:  Patient arrived to session without new complaints. Patient does get fatigued with gait training with additional balance challenges. Some remaining hesitancy evident with gait. Practiced stair navigation- patient requires 1 UE support when descending d/t imbalance. Patient reports some concerns about getting up from the floor; will work towards this in coming sessions.   OBJECTIVE IMPAIRMENTS: Abnormal gait, decreased balance, decreased mobility, difficulty walking, and decreased strength.   ACTIVITY LIMITATIONS: bending, standing, squatting, transfers, bed mobility, and locomotion level  PARTICIPATION LIMITATIONS: driving, shopping, and community activity  PERSONAL FACTORS: 3+ comorbidities: see above  are also affecting patient's functional outcome.   REHAB POTENTIAL: Good  CLINICAL DECISION MAKING: Evolving/moderate  complexity  EVALUATION COMPLEXITY: Moderate  PLAN:  PT FREQUENCY: 2x/week  PT DURATION: 8 weeks plus eval visit  PLANNED INTERVENTIONS: 97110-Therapeutic exercises, 97530- Therapeutic activity, 97112- Neuromuscular re-education, 97535- Self Care, 16109- Manual therapy, [phone]- Gait training, Patient/Family education, and Balance training  PLAN FOR NEXT SESSION: progress trunk and hip strengthening, gait training and balance to progress towards cane when appropriate  Baldemar Friday, PT, DPT 08/25/23 11:16 AM  [facility] at [facility] [address], [address] [place], [place] [address] Phone # [phone] Fax # [phone]

## 2023-08-26 ENCOUNTER — Ambulatory Visit

## 2023-08-26 ENCOUNTER — Other Ambulatory Visit: Payer: PPO

## 2023-08-26 ENCOUNTER — Ambulatory Visit: Payer: PPO | Admitting: Internal Medicine

## 2023-08-26 ENCOUNTER — Ambulatory Visit: Payer: PPO

## 2023-08-26 DIAGNOSIS — R2689 Other abnormalities of gait and mobility: Secondary | ICD-10-CM

## 2023-08-26 DIAGNOSIS — M6281 Muscle weakness (generalized): Secondary | ICD-10-CM

## 2023-08-26 DIAGNOSIS — R262 Difficulty in walking, not elsewhere classified: Secondary | ICD-10-CM

## 2023-08-26 DIAGNOSIS — R29898 Other symptoms and signs involving the musculoskeletal system: Secondary | ICD-10-CM

## 2023-08-26 DIAGNOSIS — R2681 Unsteadiness on feet: Secondary | ICD-10-CM

## 2023-08-26 NOTE — Therapy (Signed)
 OUTPATIENT PHYSICAL THERAPY NEURO TREATMENT   Patient Name: Hayley Wu MRN: [id number] DOB:11-04-1950, 73 y.o., female Today's Date: 08/26/2023   PCP: [name] REFERRING PROVIDER: Scarlett Presto [name], FNP   END OF SESSION:  PT End of Session - 08/26/23 1014     Visit Number 4    Number of Visits 17    Date for PT Re-Evaluation 10/10/23    Authorization Type [employer]    Progress Note Due on Visit 10    PT Start Time 1015    PT Stop Time 1100    PT Time Calculation (min) 45 min    Equipment Utilized During Treatment Gait belt    Activity Tolerance Patient tolerated treatment well    Behavior During Therapy WFL for tasks assessed/performed               Past Medical History:  Diagnosis Date   Allergy    Contact lens/glasses fitting    Hypertension    Past Surgical History:  Procedure Laterality Date   ABDOMINAL HYSTERECTOMY     BREAST SURGERY  2010   rt lumpectomy-neg   IR IMAGING GUIDED PORT INSERTION  12/18/2022   LAMINECTOMY N/A 09/21/2022   Procedure: THORACIC LAMINECTOMY FOR TUMOR;  Surgeon: Tia Alert, MD;  Location: [facility] OR;  Service: Neurosurgery;  Laterality: N/A;   OPEN REDUCTION INTERNAL FIXATION (ORIF) DISTAL RADIAL FRACTURE Left 11/26/2013   Procedure: OPEN REDUCTION INTERNAL FIXATION (ORIF) LEFT  DISTAL RADIUS ;  Surgeon: Nestor Lewandowsky, MD;  Location:  SURGERY CENTER;  Service: Orthopedics;  Laterality: Left;  with block   TUBAL LIGATION     Patient Active Problem List   Diagnosis Date Noted   Acute deep vein thrombosis (DVT) of femoral vein of both lower extremities (HCC) 03/19/2023   Normocytic anemia 03/01/2023   Moderate protein malnutrition (HCC) 03/01/2023   Acute respiratory failure with hypoxia (HCC) 03/01/2023   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   Cardiomegaly 03/01/2023   Pleural effusion due to CHF (congestive heart failure) (HCC) 03/01/2023   Port-A-Cath in place 12/30/2022    Encounter for antineoplastic immunotherapy 11/18/2022   Encounter for antineoplastic chemotherapy 11/18/2022   Constipation 11/05/2022   Goals of care, counseling/discussion 10/21/2022   Cancer, metastatic to bone (HCC) 10/04/2022   Adenocarcinoma of right lung, stage 4 (HCC) 10/01/2022   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 09/20/2022   Hypokalemia 09/20/2022   Lung mass 09/20/2022   Hypothyroidism 09/20/2022   Cholelithiasis 09/20/2022   Lumbar spondylosis 08/26/2022   Thoracic back pain 04/04/2022   Low back pain 04/04/2022   Hypertension 09/23/2011    ONSET DATE: 08/08/2023 (MD order)  REFERRING DIAG:  Z91.81 (ICD-10-CM) - History of falling  C34.91 (ICD-10-CM) - Malignant neoplasm of unspecified part of right bronchus or lung    THERAPY DIAG:  Muscle weakness (generalized)  Other abnormalities of gait and mobility  Unsteadiness on feet  Other symptoms and signs involving the musculoskeletal system  Difficulty in walking, not elsewhere classified  Rationale for Evaluation and Treatment: Rehabilitation  SUBJECTIVE:  SUBJECTIVE STATEMENT: Nothing new. Reports that she is gaining confidence walking indoors with the cane. Still getting fatigued/tired quickly. Hips were sore in a good way after last session  Pt accompanied by: self  PERTINENT HISTORY: Stage IV (T1b, N2, M1 C) non-small cell lung cancer, adenocarcinoma presented with right lower lobe lung nodule in addition to small bilateral pulmonary nodules and right hilar and mediastinal lymphadenopathy in addition to liver and bone metastasis diagnosed in April 2024. She is status post  decompressive thoracic laminectomy of the T7 with spinal cord decompression and biopsy of the epidural mass in April 2024.   PAIN:  Are you having pain? No  PRECAUTIONS: Fall and Other: currently in treatment for lung cancer  RED FLAGS: None   WEIGHT BEARING RESTRICTIONS: No  FALLS: Has patient fallen in last 6 months? No  LIVING ENVIRONMENT: Lives with: lives alone Lives in: House/apartment Stairs: 1 step to get into home Has following equipment at home: Single point cane, Environmental consultant - 4 wheeled, Wheelchair (manual), and shower chair  PLOF: Independent with household mobility with device and Independent with community mobility with device  PATIENT GOALS: Still need to work on balance, endurance, and would like to work on cane more.  OBJECTIVE:   TODAY'S TREATMENT: 08/26/23 Activity Comments  LAQ 3x10 5#  Hip add 3x10 2 sec hold  Seated march 3x10 5#  Seated hip abd 3x10 Double green  Sidestepping at counter x 2 min 5#  Standing hamstring curls 3x10 5# at counter  Standing paloff press  2x10 10#, visual target for form  Gait training -stepping over canes, 6" hurdle, 180 turn on foam -4 square step to figure 8 -reports some fatigue/SOB 95%, 117 bpm       TODAY'S TREATMENT: 08/22/23 Activity Comments  gait training with cane with head turns, figure 8 turns Occasional sitting rest breaks d/t c/o LE muscle fatigue; CGA and cueing for longer step; good sequencing with cane   stair navigation with cane  Cueing for proper sequencing with L LE leading; pt requires CGA-min A and 1 UE support descending   Step ups 10x each LE 6" step Weaned to 1 UE support   stepping to multidirectional targets on floor 1 UE support; required cues to relax shoulders; short steps   Backwards walking  In II bars; requires 1 UE support               HOME EXERCISE PROGRAM: Access Code: [email or web address] URL: [email or web address] Date: 08/19/2023 Prepared by: [facility] -  [facility]  Program Notes practice walking inside the house with your cane for 5 minutes per day.   Exercises - Seated Hamstring Curls with Resistance  - 1 x daily - 5 x weekly - 3 sets - 10 reps - Seated Knee Extension with Resistance  - 1 x daily - 5 x weekly - 3 sets - 10 reps - Sit to Stand with Counter Support  - 1 x daily - 5 x weekly - 2 sets - 10 reps - Seated Hip Abduction with Resistance  - 1 x daily - 5 x weekly - 2 sets - 10 reps   PATIENT EDUCATION: Education details: HEP update, encouraged practicing gait with cane inside the house with short distances; edu on how posture influences shoulder biomechanics  Person educated: Patient Education method: Explanation, Demonstration, Tactile cues, Verbal cues, and Handouts Education comprehension: verbalized understanding and returned demonstration        Note: Objective measures were  completed at Evaluation unless otherwise noted.  DIAGNOSTIC FINDINGS: NA for this episode  COGNITION: Overall cognitive status: Within functional limits for tasks assessed   SENSATION: Light touch: WFL Sometimes reports pressure/heaviness in feet  MUSCLE TONE: WFL BLEs   POSTURE: rounded shoulders and forward head  LOWER EXTREMITY ROM:   ACTIVE ROM WFL  LOWER EXTREMITY MMT:    MMT Right Eval Left Eval  Hip flexion 3+ 3+  Hip extension    Hip abduction 4 4  Hip adduction 4 4  Hip internal rotation    Hip external rotation    Knee flexion 3+ 4  Knee extension 4 4  Ankle dorsiflexion 3+ 3+  Ankle plantarflexion    Ankle inversion    Ankle eversion    (Blank rows = not tested)    TRANSFERS: Assistive device utilized: Environmental consultant - 4 wheeled  Sit to stand: Modified independence Stand to sit: Modified independence   GAIT: Gait pattern: step through pattern, decreased step length- Right, decreased stride length, and wide BOS Distance walked: 50 ft x 2 Assistive device utilized: Single point cane and Walker -  4 wheeled Level of assistance: SBA and CGA Comments: Pt brought in her cane (round tip base with SPC)  FUNCTIONAL TESTS:  5 times sit to stand: 13.63 sec arms crossed at chest Timed up and go (TUG): 17.37 with rollator; 20.91 sec with cane  10 meter walk test: 18.97 sec with rollator (1.73 ft/sec); 27.19 sec with cane (1.21 f/tsec) Berg Balance Scale: 32/56                                                                                                                                 TREATMENT DATE: 08/14/2023      GOALS: Goals reviewed with patient? Yes  SHORT TERM GOALS: Target date: 09/12/2023  Pt will be independent with HEP for improved strength, balance, gait. Baseline: Goal status: IN PROGRESS  2.  Pt will improve 5x sit<>stand to less than or equal to 12 sec to demonstrate improved functional strength and transfer efficiency. Baseline:  13.63 sec Goal status: IN PROGRESS  3.  Pt will improve Berg score to at least 38/56 to decrease fall risk. Baseline: 32/56 Goal status: IN PROGRESS  4.  Pt will improve gait velocity to at least 2 ft/sec with rollator for improved gait efficiency and safety. Baseline: 1.72 ft/sec Goal status: IN PROGRESS  LONG TERM GOALS: Target date: 10/10/2023  Pt will be independent with progression of HEP for improved strength, balance, gait. Baseline:  Goal status: IN PROGRESS  2.  Pt will improve TUG score to less than or equal to 13.5 sec for decreased fall risk. Baseline: 17.37 sec with rollator; >20 sec cane Goal status: IN PROGRESS  3.  Pt will improve Berg score to at least 45/56 to decrease fall risk. Baseline: 32/56 Goal status: IN PROGRESS  4.  Pt will improve gait velocity to at least  1.8 ft/sec with cane for improved gait efficiency and safety.  Baseline:  Goal status: IN PROGRESS  5.  Pt will verbalize continued community fitness upon d/c from PT. Baseline:  Goal status: IN PROGRESS  ASSESSMENT:  CLINICAL  IMPRESSION: Initiated session with heavy resistance training BLE to improve strength/recruitment for greater activity tolerance and improved activitation.  Gait training with emphasis on use of cane and negotiating obstacles and step height over and around barriers to improve single limb support and safety with increased use of cane.  Tolerated session well with onset of fatigue during gait and vitals reveal O2 95% and HR 109-117 bpm which pt reports is not unusual.  Pt would benefit from continued sessions to advance POC details to reduce risk for falls.  OBJECTIVE IMPAIRMENTS: Abnormal gait, decreased balance, decreased mobility, difficulty walking, and decreased strength.   ACTIVITY LIMITATIONS: bending, standing, squatting, transfers, bed mobility, and locomotion level  PARTICIPATION LIMITATIONS: driving, shopping, and community activity  PERSONAL FACTORS: 3+ comorbidities: see above  are also affecting patient's functional outcome.   REHAB POTENTIAL: Good  CLINICAL DECISION MAKING: Evolving/moderate complexity  EVALUATION COMPLEXITY: Moderate  PLAN:  PT FREQUENCY: 2x/week  PT DURATION: 8 weeks plus eval visit  PLANNED INTERVENTIONS: 97110-Therapeutic exercises, 97530- Therapeutic activity, 97112- Neuromuscular re-education, 97535- Self Care, [id number]- Manual therapy, [id number]- Gait training, Patient/Family education, and Balance training  PLAN FOR NEXT SESSION: progress trunk and hip strengthening, gait training and balance to progress towards cane when appropriate  11:04 AM, 08/26/23 M. Shary Decamp, PT, DPT Physical Therapist- [place] Office Number: [phone]

## 2023-08-27 ENCOUNTER — Inpatient Hospital Stay: Payer: PPO

## 2023-08-27 ENCOUNTER — Inpatient Hospital Stay: Payer: PPO | Admitting: Internal Medicine

## 2023-08-27 ENCOUNTER — Inpatient Hospital Stay: Payer: PPO | Attending: Internal Medicine

## 2023-08-27 VITALS — BP 122/77 | HR 109 | Temp 98.1°F | Resp 16 | Ht 63.0 in | Wt 162.3 lb

## 2023-08-27 VITALS — HR 99

## 2023-08-27 DIAGNOSIS — Z95828 Presence of other vascular implants and grafts: Secondary | ICD-10-CM

## 2023-08-27 DIAGNOSIS — C7951 Secondary malignant neoplasm of bone: Secondary | ICD-10-CM | POA: Insufficient documentation

## 2023-08-27 DIAGNOSIS — C3491 Malignant neoplasm of unspecified part of right bronchus or lung: Secondary | ICD-10-CM

## 2023-08-27 DIAGNOSIS — C787 Secondary malignant neoplasm of liver and intrahepatic bile duct: Secondary | ICD-10-CM | POA: Diagnosis not present

## 2023-08-27 DIAGNOSIS — Z5111 Encounter for antineoplastic chemotherapy: Secondary | ICD-10-CM | POA: Diagnosis not present

## 2023-08-27 DIAGNOSIS — Z79899 Other long term (current) drug therapy: Secondary | ICD-10-CM | POA: Diagnosis not present

## 2023-08-27 DIAGNOSIS — C3431 Malignant neoplasm of lower lobe, right bronchus or lung: Secondary | ICD-10-CM | POA: Diagnosis not present

## 2023-08-27 DIAGNOSIS — E039 Hypothyroidism, unspecified: Secondary | ICD-10-CM | POA: Diagnosis not present

## 2023-08-27 LAB — CBC WITH DIFFERENTIAL (CANCER CENTER ONLY)
Abs Immature Granulocytes: 0.01 10*3/uL (ref 0.00–0.07)
Basophils Absolute: 0 10*3/uL (ref 0.0–0.1)
Basophils Relative: 1 %
Eosinophils Absolute: 0.1 10*3/uL (ref 0.0–0.5)
Eosinophils Relative: 2 %
HCT: 41.9 % (ref 36.0–46.0)
Hemoglobin: 13.8 g/dL (ref 12.0–15.0)
Immature Granulocytes: 0 %
Lymphocytes Relative: 15 %
Lymphs Abs: 0.6 10*3/uL — ABNORMAL LOW (ref 0.7–4.0)
MCH: 29.7 pg (ref 26.0–34.0)
MCHC: 32.9 g/dL (ref 30.0–36.0)
MCV: 90.1 fL (ref 80.0–100.0)
Monocytes Absolute: 0.4 10*3/uL (ref 0.1–1.0)
Monocytes Relative: 9 %
Neutro Abs: 3.1 10*3/uL (ref 1.7–7.7)
Neutrophils Relative %: 73 %
Platelet Count: 212 10*3/uL (ref 150–400)
RBC: 4.65 MIL/uL (ref 3.87–5.11)
RDW: 14 % (ref 11.5–15.5)
WBC Count: 4.2 10*3/uL (ref 4.0–10.5)
nRBC: 0 % (ref 0.0–0.2)

## 2023-08-27 LAB — CMP (CANCER CENTER ONLY)
ALT: 16 U/L (ref 0–44)
AST: 19 U/L (ref 15–41)
Albumin: 4.4 g/dL (ref 3.5–5.0)
Alkaline Phosphatase: 90 U/L (ref 38–126)
Anion gap: 7 (ref 5–15)
BUN: 13 mg/dL (ref 8–23)
CO2: 27 mmol/L (ref 22–32)
Calcium: 9.4 mg/dL (ref 8.9–10.3)
Chloride: 108 mmol/L (ref 98–111)
Creatinine: 0.69 mg/dL (ref 0.44–1.00)
GFR, Estimated: >60 mL/min (ref >60)
Glucose, Bld: 120 mg/dL — ABNORMAL HIGH (ref 70–99)
Potassium: 3.8 mmol/L (ref 3.5–5.1)
Sodium: 142 mmol/L (ref 135–145)
Total Bilirubin: 0.5 mg/dL (ref 0.0–1.2)
Total Protein: 7.1 g/dL (ref 6.5–8.1)

## 2023-08-27 MED ORDER — SODIUM CHLORIDE 0.9 % IV SOLN
500.0000 mg/m2 | Freq: Once | INTRAVENOUS | Status: AC
  Administered 2023-08-27: 900 mg via INTRAVENOUS
  Filled 2023-08-27: qty 20

## 2023-08-27 MED ORDER — PROCHLORPERAZINE MALEATE 10 MG PO TABS
10.0000 mg | ORAL_TABLET | Freq: Once | ORAL | Status: AC
  Administered 2023-08-27: 10 mg via ORAL
  Filled 2023-08-27: qty 1

## 2023-08-27 MED ORDER — HEPARIN SOD (PORK) LOCK FLUSH 100 UNIT/ML IV SOLN
500.0000 [IU] | Freq: Once | INTRAVENOUS | Status: AC | PRN
Start: 2023-08-27 — End: 2023-08-27
  Administered 2023-08-27: 500 [IU]

## 2023-08-27 MED ORDER — SODIUM CHLORIDE 0.9% FLUSH
10.0000 mL | INTRAVENOUS | Status: DC | PRN
  Administered 2023-08-27: 10 mL

## 2023-08-27 MED ORDER — SODIUM CHLORIDE 0.9 % IV SOLN
Freq: Once | INTRAVENOUS | Status: AC
Start: 2023-08-27 — End: 2023-08-27

## 2023-08-27 MED ORDER — DEXAMETHASONE SODIUM PHOSPHATE 10 MG/ML IJ SOLN
10.0000 mg | Freq: Once | INTRAMUSCULAR | Status: AC
  Administered 2023-08-27: 10 mg via INTRAVENOUS
  Filled 2023-08-27: qty 1

## 2023-08-27 MED ORDER — SODIUM CHLORIDE 0.9% FLUSH
10.0000 mL | Freq: Once | INTRAVENOUS | Status: AC
  Administered 2023-08-27: 10 mL

## 2023-08-27 NOTE — Patient Instructions (Signed)
 [facility] - A DEPT OF [facility]  Discharge Instructions: Thank you for choosing [facility] to provide your oncology and hematology care.   If you have a lab appointment with the Cancer Center, please go directly to the Cancer Center and check in at the registration area.   Wear comfortable clothing and clothing appropriate for easy access to any Portacath or PICC line.   We strive to give you quality time with your provider. You may need to reschedule your appointment if you arrive late (15 or more minutes).  Arriving late affects you and other patients whose appointments are after yours.  Also, if you miss three or more appointments without notifying the office, you may be dismissed from the clinic at the provider's discretion.      For prescription refill requests, have your pharmacy contact our office and allow 72 hours for refills to be completed.    Today you received the following chemotherapy and/or immunotherapy agents Alimta      To help prevent nausea and vomiting after your treatment, we encourage you to take your nausea medication as directed.  BELOW ARE SYMPTOMS THAT SHOULD BE REPORTED IMMEDIATELY: *FEVER GREATER THAN 100.4 F (38 C) OR HIGHER *CHILLS OR SWEATING *NAUSEA AND VOMITING THAT IS NOT CONTROLLED WITH YOUR NAUSEA MEDICATION *UNUSUAL SHORTNESS OF BREATH *UNUSUAL BRUISING OR BLEEDING *URINARY PROBLEMS (pain or burning when urinating, or frequent urination) *BOWEL PROBLEMS (unusual diarrhea, constipation, pain near the anus) TENDERNESS IN MOUTH AND THROAT WITH OR WITHOUT PRESENCE OF ULCERS (sore throat, sores in mouth, or a toothache) UNUSUAL RASH, SWELLING OR PAIN  UNUSUAL VAGINAL DISCHARGE OR ITCHING   Items with * indicate a potential emergency and should be followed up as soon as possible or go to the Emergency Department if any problems should occur.  Please show the CHEMOTHERAPY ALERT CARD or IMMUNOTHERAPY  ALERT CARD at check-in to the Emergency Department and triage nurse.  Should you have questions after your visit or need to cancel or reschedule your appointment, please contact [facility] - A DEPT OF [facility]  Dept: [phone]  and follow the prompts.  Office hours are 8:00 a.m. to 4:30 p.m. Monday - Friday. Please note that voicemails left after 4:00 p.m. may not be returned until the following business day.  We are closed weekends and major holidays. You have access to a nurse at all times for urgent questions. Please call the main number to the clinic Dept: [phone] and follow the prompts.   For any non-urgent questions, you may also contact your provider using MyChart. We now offer e-Visits for anyone 79 and older to request care online for non-urgent symptoms. For details visit [email or web address].   Also download the MyChart app! Go to the app store, search "MyChart", open the app, select [name], and log in with your MyChart username and password.

## 2023-08-27 NOTE — Progress Notes (Signed)
 [facility] [phone]   [phone]  OFFICE PROGRESS NOTE  Stamey, [name], FNP [address] [place] [address]  DIAGNOSIS: Stage IV (T1b, N2, M1 C) non-small cell lung cancer, adenocarcinoma presented with right lower lobe lung nodule in addition to small bilateral pulmonary nodules and right hilar and mediastinal lymphadenopathy in addition to liver and bone metastasis diagnosed in April 2024. 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   PDL1: 20%   Guardant 360: no actionable mutations.    PRIOR THERAPY: 1) Decompressive thoracic laminectomy of the T7 with spinal cord decompression and biopsy of the epidural mass in April 2024.  2) Palliative radiation to the bone under the care of Dr. Basilio Cairo. Last day scheduled for 10/25/22    CURRENT THERAPY: Carboplatin for an AUC of 5, Alimta 500 mg/m, Keytruda 200 mg IV every 3 weeks.  First dose expected on 10/28/2022. Status post 11 cycles.  Starting from cycle #5 the patient is on maintenance treatment with Alimta and Keytruda every 3 weeks.  Starting from cycle #7 the patient will be treated with single agent Alimta 500 Mg/M2 every 3 weeks.  Rande Lawman was discontinued secondary to suspicious immunotherapy mediated pneumonitis.  INTERVAL HISTORY: Hayley Wu 73 y.o. female returns to the clinic today for follow-up visit, by her sister.Discussed the use of AI scribe software for clinical note transcription with the patient, who gave verbal consent to proceed.  History of Present Illness   Hayley Wu is a 73 year old female with adenocarcinoma of the lung who presents for chemotherapy follow-up. She is accompanied by her sister.  She has a history of adenocarcinoma of the lung, diagnosed in April 2024. She has been receiving chemotherapy with Alimta and Keytruda. Rande Lawman was discontinued  after six cycles due to pneumonitis, and she has continued on Alimta alone. As of today, she has completed twelve cycles and is here for her thirteenth cycle.  No new symptoms since her last visit three weeks ago. No chest pain, breathing issues, cough, hemoptysis, nausea, vomiting, or diarrhea. She continues to participate in physical therapy, although it causes hip pain.  A recent scan of the chest, abdomen, and pelvis performed last week showed stable disease with no new lesions or growth. Her lab work today is also stable.  She inquires about hair loss, which she attributes to her thyroid levels being elevated (previously noted to be 12, with a normal range of 1-4).       MEDICAL HISTORY: Past Medical History:  Diagnosis Date   Allergy    Contact lens/glasses fitting    Hypertension     ALLERGIES:  is allergic to erythromycin, amoxicillin, latex, and prednisone.  MEDICATIONS:  Current Outpatient Medications  Medication Sig Dispense Refill   acetaminophen (TYLENOL) 500 MG tablet Take 500 mg by mouth every 6 (six) hours as needed for moderate pain.     amLODipine (NORVASC) 10 MG tablet Take 10 mg by mouth daily.     apixaban (ELIQUIS) 5 MG TABS tablet Take 1 tablet (5 mg total) by mouth 2 (two) times daily. 60 tablet 2   folic acid (FOLVITE) 1 MG tablet Take 1 tablet (1 mg total) by mouth daily. 90 tablet 1   Ipratropium-Albuterol (COMBIVENT) 20-100 MCG/ACT AERS respimat Inhale 1 puff into the lungs every 6 (six) hours as needed for  wheezing or shortness of breath. 4 g 1   levothyroxine (SYNTHROID) 112 MCG tablet Take 112 mcg by mouth daily before breakfast.     lidocaine-prilocaine (EMLA) cream Apply 1 Application topically as needed. 30 g 2   mirtazapine (REMERON) 30 MG tablet Take 1 tablet (30 mg total) by mouth at bedtime. 90 tablet 0   potassium chloride SA (KLOR-CON M) 20 MEQ tablet Take 1 tablet (20 mEq total) by mouth daily. (Patient not taking: Reported on 08/14/2023) 6 tablet  0   prochlorperazine (COMPAZINE) 10 MG tablet Take 1 tablet (10 mg total) by mouth every 6 (six) hours as needed. (Patient taking differently: Take 10 mg by mouth every 6 (six) hours as needed for refractory nausea / vomiting.) 30 tablet 2   VITAMIN D PO Take 1 tablet by mouth daily.     No current facility-administered medications for this visit.    SURGICAL HISTORY:  Past Surgical History:  Procedure Laterality Date   ABDOMINAL HYSTERECTOMY     BREAST SURGERY  2010   rt lumpectomy-neg   IR IMAGING GUIDED PORT INSERTION  12/18/2022   LAMINECTOMY N/A 09/21/2022   Procedure: THORACIC LAMINECTOMY FOR TUMOR;  Surgeon: Tia Alert, MD;  Location: [facility] OR;  Service: Neurosurgery;  Laterality: N/A;   OPEN REDUCTION INTERNAL FIXATION (ORIF) DISTAL RADIAL FRACTURE Left 11/26/2013   Procedure: OPEN REDUCTION INTERNAL FIXATION (ORIF) LEFT  DISTAL RADIUS ;  Surgeon: Nestor Lewandowsky, MD;  Location: [facility];  Service: Orthopedics;  Laterality: Left;  with block   TUBAL LIGATION      REVIEW OF SYSTEMS:  Constitutional: negative Eyes: negative Ears, nose, mouth, throat, and face: negative Respiratory: negative Cardiovascular: negative Gastrointestinal: negative Genitourinary:negative Integument/breast: negative Hematologic/lymphatic: negative Musculoskeletal:positive for arthralgias and muscle weakness Neurological: negative Behavioral/Psych: negative Endocrine: negative Allergic/Immunologic: negative   PHYSICAL EXAMINATION: General appearance: alert, cooperative, and no distress Head: Normocephalic, without obvious abnormality, atraumatic Neck: no adenopathy, no JVD, supple, symmetrical, trachea midline, and thyroid not enlarged, symmetric, no tenderness/mass/nodules Lymph nodes: Cervical, supraclavicular, and axillary nodes normal. Resp: clear to auscultation bilaterally Back: symmetric, no curvature. ROM normal. No CVA tenderness. Cardio: regular rate and rhythm, S1, S2  normal, no murmur, click, rub or gallop GI: soft, non-tender; bowel sounds normal; no masses,  no organomegaly Extremities: extremities normal, atraumatic, no cyanosis or edema Neurologic: Alert and oriented X 3, normal strength and tone. Normal symmetric reflexes. Normal coordination and gait  ECOG PERFORMANCE STATUS: 1 - Symptomatic but completely ambulatory  Blood pressure 122/77, pulse (!) 109, temperature 98.1 F (36.7 C), resp. rate 16, height 5\' 3"  (1.6 m), weight 162 lb 4.8 oz (73.6 kg), SpO2 97%.  LABORATORY DATA: Lab Results  Component Value Date   WBC 4.2 08/27/2023   HGB 13.8 08/27/2023   HCT 41.9 08/27/2023   MCV 90.1 08/27/2023   PLT 212 08/27/2023      Chemistry      Component Value Date/Time   NA 142 08/27/2023 1101   K 3.8 08/27/2023 1101   CL 108 08/27/2023 1101   CO2 27 08/27/2023 1101   BUN 13 08/27/2023 1101   CREATININE 0.69 08/27/2023 1101      Component Value Date/Time   CALCIUM 9.4 08/27/2023 1101   ALKPHOS 90 08/27/2023 1101   AST 19 08/27/2023 1101   ALT 16 08/27/2023 1101   BILITOT 0.5 08/27/2023 1101       RADIOGRAPHIC STUDIES: CT CHEST ABDOMEN PELVIS W CONTRAST Result Date: 08/20/2023 CLINICAL DATA:  Non-small-cell lung cancer staging. * Tracking Code: BO * EXAM: CT CHEST, ABDOMEN, AND PELVIS WITH CONTRAST TECHNIQUE: Multidetector CT imaging of the chest, abdomen and pelvis was performed following the standard protocol during bolus administration of intravenous contrast. RADIATION DOSE REDUCTION: This exam was performed according to the departmental dose-optimization program which includes automated exposure control, adjustment of the mA and/or kV according to patient size and/or use of iterative reconstruction technique. CONTRAST:  OMNIPAQUE IOHEXOL 300 MG/ML  SOLN COMPARISON:  06/10/2023. FINDINGS: CT CHEST FINDINGS Cardiovascular: Right upper chest port again identified. Tip extends to the right atrium. The port is accessed. Moderate  pericardial effusion is stable. Coronary calcifications are seen. The thoracic aorta is normal course and caliber with partially calcified plaque. Mediastinum/Nodes: Small heterogeneous thyroid gland is stable. Patulous appearance to the esophagus with wall thickening diffusely, unchanged. There is edema in the mediastinum. No specific abnormal lymph node enlargement identified in the axillary regions, hilum or mediastinum. Stable small mediastinal nodes are identified. The previous node anterior to the carina which measured 9 mm in short axis, today measures 9 mm on series 2, image 22, unchanged. Lungs/Pleura: Decreasing small right pleural effusion. Trace left pleural effusion is similar. Once again lungs have areas of interstitial septal thickening including areas of scarring fibrotic changes. Bandlike opacity is seen adjacent to the mediastinum are stable and could be posttreatment related. Underlying emphysematous lung changes identified once again greatest in the upper lung zones. Persistent confluent opacity medially in the right lower lobe with distortion and air bronchograms, unchanged. The right lower lobe spiculated nodular area which measured 15 x 12 mm on the prior examination, today measures 14 by 11 mm on series 4, image 78, not significantly changed. The right apical lesion which measured 7 mm on the prior examination, today measures 7 mm on series 4, image 38. Few other tiny areas of nodularity are also stable bilaterally. Musculoskeletal: Degenerative changes again seen along the spine. There is again severe compression along the midthoracic spine with kyphosis and surgical changes from laminectomy. Associated areas of bony sclerosis at these levels as well as elsewhere in the thoracic spine consistent with known osseous metastatic disease. By CT the extent and distribution appears similar to the prior examination. CT ABDOMEN PELVIS FINDINGS Hepatobiliary: Large stone the distended gallbladder.  Unchanged. Stable ectasia of the biliary tree but normal tapering of the common duct towards the pancreas. Stable benign cystic lesions in liver. The more complex focus inferiorly on the right side which measured 11 mm on the prior, today on series 2, image 60 measures 10 mm, not significantly changed. Patent portal vein. Pancreas: Focal atrophy of the pancreas.  No obvious mass. Spleen: Normal in size without focal abnormality. Adrenals/Urinary Tract: The adrenal glands are preserved. Mild bilateral renal atrophy. No enhancing renal mass or collecting system dilatation. The ureters have normal course and caliber extending down to the urinary bladder. Preserved contour to the urinary bladder. Stomach/Bowel: Large bowel has a normal course and caliber. Scattered colonic stool. Left-sided colonic diverticula. Normal appendix. Nonspecific distal small bowel stool appearance. Vascular/Lymphatic: Aortic atherosclerosis. No enlarged abdominal or pelvic lymph nodes. Reproductive: Status post hysterectomy. No adnexal masses. Other: Anasarca.  No free air or free fluid. Musculoskeletal: Once again there is some scattered sclerotic lesions. Degenerative changes. Areas of stenosis along the lumbar spine. Multilevel trace listhesis along the lumbar spine. IMPRESSION: Overall no significant interval change. Stable spiculated nodules in the right lung. Persistent pleural effusion on the right. Stable hepatic  cystic foci. Gallstone in the dilated gallbladder. Unchanged from previous. Please correlate with history and symptoms. Multifocal osseous sclerotic lesions. Stable compression deformities along the midthoracic spine with kyphosis, stenosis and surgical change. Persistent moderate pericardial effusion. Mild wall thickening along the esophagus. Please correlate with symptoms. Electronically Signed   By: Karen Kays M.D.   On: 08/20/2023 16:15    ASSESSMENT AND PLAN: This is a very pleasant 73 years old white female with  Stage IV (T1b, N2, M1 C) non-small cell lung cancer, adenocarcinoma presented with right lower lobe lung nodule in addition to small bilateral pulmonary nodules and right hilar and mediastinal lymphadenopathy in addition to liver and bone metastasis diagnosed in April 2024. 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 Molecular studies showed no actionable mutations and PD-L1 expression of 20%. 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  She also had palliative radiation to the bone under the care of Dr. Basilio Cairo. Last day scheduled for 10/25/22. She is currently on palliative systemic chemotherapy with carboplatin for AUC of 5, Alimta 500 Mg/M2 and Keytruda 200 Mg IV every 3 weeks status post 12 cycles.  Starting from cycle #5 the patient is on maintenance treatment with Alimta and Keytruda every 3 weeks.  After cycle #6 Keytruda was discontinued secondary to suspicious immunotherapy mediated pneumonitis with recent hospitalization and acute respiratory failure. The patient has been tolerating this treatment fairly well with no concerning adverse effects. She had repeat CT scan of the chest, abdomen and pelvis performed recently.  I personally and independently reviewed the scan and discussed the result with the patient today.  Her scan showed no concerning findings for disease progression. Assessment and Plan    Adenocarcinoma of the lung Stage IV (T1b, N2, M1 C) non-small cell lung cancer, adenocarcinoma presented with right lower lobe lung nodule in addition to small bilateral pulmonary nodules and right hilar and mediastinal lymphadenopathy in addition to liver and bone metastasis diagnosed in April 2024. She is status post decompressive thoracic laminectomy of the T7 with spinal cord decompression and  biopsy of the epidural mass in April 2024. Adenocarcinoma of the lung treated with Alimta and previously with Keytruda, which was discontinued due to pneumonitis. She has completed 12 cycles of Alimta, with today being cycle 13. Imaging of the chest, abdomen, and pelvis shows no new lesions or growth. She tolerates the treatment well, with hair loss as a notable side effect. Lab work supports continuation of the current regimen. Discontinuation may lead to disease flare-up. - Continue Alimta every three weeks - Monitor for side effects and disease progression - Review lab work and imaging results regularly  Pneumonitis Pneumonitis developed as a side effect of Keytruda, leading to its discontinuation. The condition appears resolved with no current respiratory symptoms. - Monitor for any respiratory symptoms  Hypothyroidism Thyroid level is 12, above the normal range of 1 to 4, potentially contributing to hair loss. She is scheduled to see her family doctor for medication adjustment. - Follow up with family doctor for thyroid medication adjustment  Hair loss Experiencing hair loss, potentially due to Alimta (20% risk) and exacerbated by hypothyroidism. - Discuss potential causes of hair loss - Monitor hair loss and adjust treatment if necessary  Hip pain Reports hip pain, possibly related to physical therapy. Pain is not  severe enough to discontinue therapy, which aids in increasing strength. - Continue physical therapy - Monitor hip pain and adjust therapy as needed    The patient was advised to call immediately if she has any other concerning symptoms in the interval. The patient voices understanding of current disease status and treatment options and is in agreement with the current care plan.  All questions were answered. The patient knows to call the clinic with any problems, questions or concerns. We can certainly see the patient much sooner if necessary.  The total time spent in the  appointment was 30 minutes.  Disclaimer: This note was dictated with voice recognition software. Similar sounding words can inadvertently be transcribed and may not be corrected upon review.

## 2023-08-29 ENCOUNTER — Encounter: Payer: Self-pay | Admitting: Physical Therapy

## 2023-08-29 ENCOUNTER — Ambulatory Visit: Admitting: Physical Therapy

## 2023-08-29 DIAGNOSIS — E039 Hypothyroidism, unspecified: Secondary | ICD-10-CM | POA: Diagnosis not present

## 2023-08-29 DIAGNOSIS — E7800 Pure hypercholesterolemia, unspecified: Secondary | ICD-10-CM | POA: Diagnosis not present

## 2023-08-29 DIAGNOSIS — Z720 Tobacco use: Secondary | ICD-10-CM | POA: Diagnosis not present

## 2023-08-29 DIAGNOSIS — C3491 Malignant neoplasm of unspecified part of right bronchus or lung: Secondary | ICD-10-CM | POA: Diagnosis not present

## 2023-08-29 DIAGNOSIS — M6281 Muscle weakness (generalized): Secondary | ICD-10-CM

## 2023-08-29 DIAGNOSIS — Z0000 Encounter for general adult medical examination without abnormal findings: Secondary | ICD-10-CM | POA: Diagnosis not present

## 2023-08-29 DIAGNOSIS — I10 Essential (primary) hypertension: Secondary | ICD-10-CM | POA: Diagnosis not present

## 2023-08-29 DIAGNOSIS — C7951 Secondary malignant neoplasm of bone: Secondary | ICD-10-CM | POA: Diagnosis not present

## 2023-08-29 DIAGNOSIS — Z1211 Encounter for screening for malignant neoplasm of colon: Secondary | ICD-10-CM | POA: Diagnosis not present

## 2023-08-29 DIAGNOSIS — Z1239 Encounter for other screening for malignant neoplasm of breast: Secondary | ICD-10-CM | POA: Diagnosis not present

## 2023-08-29 DIAGNOSIS — R2681 Unsteadiness on feet: Secondary | ICD-10-CM

## 2023-08-29 DIAGNOSIS — R2689 Other abnormalities of gait and mobility: Secondary | ICD-10-CM

## 2023-08-29 NOTE — Therapy (Signed)
 OUTPATIENT PHYSICAL THERAPY NEURO TREATMENT   Patient Name: Hayley Wu MRN: [id number] DOB:11-Jan-1951, 73 y.o., female Today's Date: 08/29/2023   PCP: [name] REFERRING PROVIDER: Scarlett Presto [name], FNP   END OF SESSION:  PT End of Session - 08/29/23 1024     Visit Number 5    Number of Visits 17    Date for PT Re-Evaluation 10/10/23    Authorization Type [employer]    Progress Note Due on Visit 10    PT Start Time 1022    PT Stop Time 1100    PT Time Calculation (min) 38 min    Equipment Utilized During Treatment Gait belt    Activity Tolerance Patient tolerated treatment well    Behavior During Therapy WFL for tasks assessed/performed                Past Medical History:  Diagnosis Date   Allergy    Contact lens/glasses fitting    Hypertension    Past Surgical History:  Procedure Laterality Date   ABDOMINAL HYSTERECTOMY     BREAST SURGERY  2010   rt lumpectomy-neg   IR IMAGING GUIDED PORT INSERTION  12/18/2022   LAMINECTOMY N/A 09/21/2022   Procedure: THORACIC LAMINECTOMY FOR TUMOR;  Surgeon: Tia Alert, MD;  Location: [facility] OR;  Service: Neurosurgery;  Laterality: N/A;   OPEN REDUCTION INTERNAL FIXATION (ORIF) DISTAL RADIAL FRACTURE Left 11/26/2013   Procedure: OPEN REDUCTION INTERNAL FIXATION (ORIF) LEFT  DISTAL RADIUS ;  Surgeon: Nestor Lewandowsky, MD;  Location:  SURGERY CENTER;  Service: Orthopedics;  Laterality: Left;  with block   TUBAL LIGATION     Patient Active Problem List   Diagnosis Date Noted   Acute deep vein thrombosis (DVT) of femoral vein of both lower extremities (HCC) 03/19/2023   Normocytic anemia 03/01/2023   Moderate protein malnutrition (HCC) 03/01/2023   Acute respiratory failure with hypoxia (HCC) 03/01/2023   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   Cardiomegaly 03/01/2023   Pleural effusion due to CHF (congestive heart failure) (HCC) 03/01/2023   Port-A-Cath in place 12/30/2022    Encounter for antineoplastic immunotherapy 11/18/2022   Encounter for antineoplastic chemotherapy 11/18/2022   Constipation 11/05/2022   Goals of care, counseling/discussion 10/21/2022   Cancer, metastatic to bone (HCC) 10/04/2022   Adenocarcinoma of right lung, stage 4 (HCC) 10/01/2022   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 09/20/2022   Hypokalemia 09/20/2022   Lung mass 09/20/2022   Hypothyroidism 09/20/2022   Cholelithiasis 09/20/2022   Lumbar spondylosis 08/26/2022   Thoracic back pain 04/04/2022   Low back pain 04/04/2022   Hypertension 09/23/2011    ONSET DATE: 08/08/2023 (MD order)  REFERRING DIAG:  Z91.81 (ICD-10-CM) - History of falling  C34.91 (ICD-10-CM) - Malignant neoplasm of unspecified part of right bronchus or lung    THERAPY DIAG:  Muscle weakness (generalized)  Other abnormalities of gait and mobility  Unsteadiness on feet  Rationale for Evaluation and Treatment: Rehabilitation  SUBJECTIVE:  SUBJECTIVE STATEMENT: Had chemo on Wednesday, so I'm tired today.  Had the 3 month scan and everything is holding stable.  Still have fear that I may fall at times.  Pt accompanied by: self  PERTINENT HISTORY: Stage IV (T1b, N2, M1 C) non-small cell lung cancer, adenocarcinoma presented with right lower lobe lung nodule in addition to small bilateral pulmonary nodules and right hilar and mediastinal lymphadenopathy in addition to liver and bone metastasis diagnosed in April 2024. 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   PAIN:  Are you having  pain? No  PRECAUTIONS: Fall and Other: currently in treatment for lung cancer  RED FLAGS: None   WEIGHT BEARING RESTRICTIONS: No  FALLS: Has patient fallen in last 6 months? No  LIVING ENVIRONMENT: Lives with: lives alone Lives in: House/apartment Stairs: 1 step to get into home Has following equipment at home: Single point cane, Environmental consultant - 4 wheeled, Wheelchair (manual), and shower chair  PLOF: Independent with household mobility with device and Independent with community mobility with device  PATIENT GOALS: Still need to work on balance, endurance, and would like to work on cane more.  OBJECTIVE:    TODAY'S TREATMENT: 08/29/2023 Activity Comments  LAQ 3 x 10 5#  Seated march 3 x 10 5#-cues to avoid posterior trunk lean  Seated step out/in for hip abduction   Standing hamstring curls 3 x 10 5#  Sidestep/together at counter, 2 x 10 5#  Forward/back walking at counter, 5 reps LUE support, cues for RLE heelstrike>push off  Alt step taps to 4" step, 2 x 10 Progressed BUE>1 UE support  Gait training with cane, 75 ft x 2 Mild unsteadiness with turns  Stand squat to elevated mat, 5 reps x 2 Cues for hinge at hips for improved control and form    HOME EXERCISE PROGRAM: Access Code: [email or web address] URL: [email or web address] Date: 08/29/2023 Prepared by: [facility] - Outpatient  Rehab - [facility]  Program Notes practice walking inside the house with your cane for 5 minutes per day.   Exercises - Seated Hamstring Curls with Resistance  - 1 x daily - 5 x weekly - 3 sets - 10 reps - Seated Knee Extension with Resistance  - 1 x daily - 5 x weekly - 3 sets - 10 reps - Sit to Stand with Counter Support  - 1 x daily - 5 x weekly - 2 sets - 10 reps - Seated Hip Abduction with Resistance  - 1 x daily - 5 x weekly - 2 sets - 10 reps - Side Stepping with Counter Support  - 1 x daily - 5 x weekly - 2-3 sets - 10 reps - Alternating Step Taps with Counter Support  - 1 x daily -  5 x weekly - 3 sets - 10 reps - Backward Walking with Counter Support  - 1 x daily - 5 x weekly - 1 sets - 5 reps        PATIENT EDUCATION: Education details: HEP update Person educated: Patient Education method: Explanation, Demonstration, Tactile cues, Verbal cues, and Handouts Education comprehension: verbalized understanding and returned demonstration        Note: Objective measures were completed at Evaluation unless otherwise noted.  DIAGNOSTIC FINDINGS: NA for this episode  COGNITION: Overall cognitive status: Within functional limits for tasks assessed   SENSATION: Light touch: WFL Sometimes reports pressure/heaviness in feet  MUSCLE TONE:  WFL BLEs   POSTURE: rounded shoulders and forward head  LOWER EXTREMITY ROM:   ACTIVE ROM WFL  LOWER EXTREMITY MMT:    MMT Right Eval Left Eval  Hip flexion 3+ 3+  Hip extension    Hip abduction 4 4  Hip adduction 4 4  Hip internal rotation    Hip external rotation    Knee flexion 3+ 4  Knee extension 4 4  Ankle dorsiflexion 3+ 3+  Ankle plantarflexion    Ankle inversion    Ankle eversion    (Blank rows = not tested)    TRANSFERS: Assistive device utilized: Environmental consultant - 4 wheeled  Sit to stand: Modified independence Stand to sit: Modified independence   GAIT: Gait pattern: step through pattern, decreased step length- Right, decreased stride length, and wide BOS Distance walked: 50 ft x 2 Assistive device utilized: Single point cane and Walker - 4 wheeled Level of assistance: SBA and CGA Comments: Pt brought in her cane (round tip base with SPC)  FUNCTIONAL TESTS:  5 times sit to stand: 13.63 sec arms crossed at chest Timed up and go (TUG): 17.37 with rollator; 20.91 sec with cane  10 meter walk test: 18.97 sec with rollator (1.73 ft/sec); 27.19 sec with cane (1.21 f/tsec) Berg Balance Scale: 32/56                                                                                                                                  TREATMENT DATE: 08/14/2023      GOALS: Goals reviewed with patient? Yes  SHORT TERM GOALS: Target date: 09/12/2023  Pt will be independent with HEP for improved strength, balance, gait. Baseline: Goal status: IN PROGRESS  2.  Pt will improve 5x sit<>stand to less than or equal to 12 sec to demonstrate improved functional strength and transfer efficiency. Baseline:  13.63 sec Goal status: IN PROGRESS  3.  Pt will improve Berg score to at least 38/56 to decrease fall risk. Baseline: 32/56 Goal status: IN PROGRESS  4.  Pt will improve gait velocity to at least 2 ft/sec with rollator for improved gait efficiency and safety. Baseline: 1.72 ft/sec Goal status: IN PROGRESS  LONG TERM GOALS: Target date: 10/10/2023  Pt will be independent with progression of HEP for improved strength, balance, gait. Baseline:  Goal status: IN PROGRESS  2.  Pt will improve TUG score to less than or equal to 13.5 sec for decreased fall risk. Baseline: 17.37 sec with rollator; >20 sec cane Goal status: IN PROGRESS  3.  Pt will improve Berg score to at least 45/56 to decrease fall risk. Baseline: 32/56 Goal status: IN PROGRESS  4.  Pt will improve gait velocity to at least 1.8 ft/sec with cane for improved gait efficiency and safety.  Baseline:  Goal status: IN PROGRESS  5.  Pt will verbalize continued community fitness upon d/c from PT. Baseline:  Goal status: IN PROGRESS  ASSESSMENT:  CLINICAL IMPRESSION: Pt presents today with no new complaints. Skilled PT session focused on continued lower extremity strengthening and balance work.  Able to progress to standing exercises with additions to HEP.  Pt still has some mild unsteadiness with turns with gait using cane, but is overall ambulating with a smoother pattern. Pt will continue to benefit from skilled PT towards goals for improved functional mobility and decreased fall risk.   OBJECTIVE IMPAIRMENTS: Abnormal gait,  decreased balance, decreased mobility, difficulty walking, and decreased strength.   ACTIVITY LIMITATIONS: bending, standing, squatting, transfers, bed mobility, and locomotion level  PARTICIPATION LIMITATIONS: driving, shopping, and community activity  PERSONAL FACTORS: 3+ comorbidities: see above  are also affecting patient's functional outcome.   REHAB POTENTIAL: Good  CLINICAL DECISION MAKING: Evolving/moderate complexity  EVALUATION COMPLEXITY: Moderate  PLAN:  PT FREQUENCY: 2x/week  PT DURATION: 8 weeks plus eval visit  PLANNED INTERVENTIONS: 97110-Therapeutic exercises, 97530- Therapeutic activity, 97112- Neuromuscular re-education, 97535- Self Care, 78295- Manual therapy, [id number]- Gait training, Patient/Family education, and Balance training  PLAN FOR NEXT SESSION: Review HEP updates and progress trunk and hip strengthening, gait training/turns with cane, and balance work  Lonia Blood, PT 08/29/23 12:10 PM Phone: [phone] Fax: [phone]  [facility] at [facility] [address] [place], [place] [address] Phone # [phone] Fax # [phone]

## 2023-09-01 NOTE — Therapy (Signed)
 OUTPATIENT PHYSICAL THERAPY NEURO TREATMENT   Patient Name: Hayley Wu MRN: [id number] DOB:May 01, 1951, 73 y.o., female Today's Date: 09/02/2023   PCP: [name] REFERRING PROVIDER: Scarlett Presto [name], FNP   END OF SESSION:  PT End of Session - 09/02/23 1056     Visit Number 6    Number of Visits 17    Date for PT Re-Evaluation 10/10/23    Authorization Type [employer]    Progress Note Due on Visit 10    PT Start Time 1013    PT Stop Time 1059    PT Time Calculation (min) 46 min    Equipment Utilized During Treatment Gait belt    Activity Tolerance Patient tolerated treatment well    Behavior During Therapy WFL for tasks assessed/performed                 Past Medical History:  Diagnosis Date   Allergy    Contact lens/glasses fitting    Hypertension    Past Surgical History:  Procedure Laterality Date   ABDOMINAL HYSTERECTOMY     BREAST SURGERY  2010   rt lumpectomy-neg   IR IMAGING GUIDED PORT INSERTION  12/18/2022   LAMINECTOMY N/A 09/21/2022   Procedure: THORACIC LAMINECTOMY FOR TUMOR;  Surgeon: Tia Alert, MD;  Location: [facility] OR;  Service: Neurosurgery;  Laterality: N/A;   OPEN REDUCTION INTERNAL FIXATION (ORIF) DISTAL RADIAL FRACTURE Left 11/26/2013   Procedure: OPEN REDUCTION INTERNAL FIXATION (ORIF) LEFT  DISTAL RADIUS ;  Surgeon: Nestor Lewandowsky, MD;  Location: [facility];  Service: Orthopedics;  Laterality: Left;  with block   TUBAL LIGATION     Patient Active Problem List   Diagnosis Date Noted   Acute deep vein thrombosis (DVT) of femoral vein of both lower extremities (HCC) 03/19/2023   Normocytic anemia 03/01/2023   Moderate protein malnutrition (HCC) 03/01/2023   Acute respiratory failure with hypoxia (HCC) 03/01/2023   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   Cardiomegaly 03/01/2023   Pleural effusion due to CHF (congestive heart failure) (HCC) 03/01/2023   Port-A-Cath in place  12/30/2022   Encounter for antineoplastic immunotherapy 11/18/2022   Encounter for antineoplastic chemotherapy 11/18/2022   Constipation 11/05/2022   Goals of care, counseling/discussion 10/21/2022   Cancer, metastatic to bone (HCC) 10/04/2022   Adenocarcinoma of right lung, stage 4 (HCC) 10/01/2022   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 09/20/2022   Hypokalemia 09/20/2022   Lung mass 09/20/2022   Hypothyroidism 09/20/2022   Cholelithiasis 09/20/2022   Lumbar spondylosis 08/26/2022   Thoracic back pain 04/04/2022   Low back pain 04/04/2022   Hypertension 09/23/2011    ONSET DATE: 08/08/2023 (MD order)  REFERRING DIAG:  Z91.81 (ICD-10-CM) - History of falling  C34.91 (ICD-10-CM) - Malignant neoplasm of unspecified part of right bronchus or lung    THERAPY DIAG:  Muscle weakness (generalized)  Other abnormalities of gait and mobility  Unsteadiness on feet  Other symptoms and signs involving the musculoskeletal system  Difficulty in walking, not elsewhere classified  Rationale for Evaluation and Treatment: Rehabilitation  SUBJECTIVE:  SUBJECTIVE STATEMENT: Reports that when spreading weed killer, her 4WW rolled down the driveway. Had to hold onto a wall to retreive it. Was able to get into her relative's car without as much issue.  Pt accompanied by: self  PERTINENT HISTORY: Stage IV (T1b, N2, M1 C) non-small cell lung cancer, adenocarcinoma presented with right lower lobe lung nodule in addition to small bilateral pulmonary nodules and right hilar and mediastinal lymphadenopathy in addition to liver and bone metastasis diagnosed in April 2024. She is  status post decompressive thoracic laminectomy of the T7 with spinal cord decompression and biopsy of the epidural mass in April 2024.   PAIN:  Are you having pain? No  PRECAUTIONS: Fall and Other: currently in treatment for lung cancer  RED FLAGS: None   WEIGHT BEARING RESTRICTIONS: No  FALLS: Has patient fallen in last 6 months? No  LIVING ENVIRONMENT: Lives with: lives alone Lives in: House/apartment Stairs: 1 step to get into home Has following equipment at home: Single point cane, Environmental consultant - 4 wheeled, Wheelchair (manual), and shower chair  PLOF: Independent with household mobility with device and Independent with community mobility with device  PATIENT GOALS: Still need to work on balance, endurance, and would like to work on cane more.  OBJECTIVE:     TODAY'S TREATMENT: 09/02/23 Activity Comments  1/2 kneel to stand on airex Cueing for positioning; CGA for safety. Patient did this well  Squat to airex on mat 10x  Good tolerance   Floor transfer with use of mat for support 2x CGA; cueing for positioning; pt performed safely but reported R knee tightness in kneeling position   stair navigation with cane 6x  Without handrail and with CGA-min A for safety; pt requires cues for big kick with R foot when descending. Good stability ascending   Gait training with pt's cane ~114ft Good stability; shoulders guarded       PATIENT EDUCATION: Education details: discussed pt's remaining functional limitations- she reports concerns about car transfers and floor transfers, concerns about getting groceries into the house, balance in the shower if not using transfer chair ; HEP update Person educated: Patient Education method: Explanation, Demonstration, Tactile cues, Verbal cues, and Handouts Education comprehension: verbalized understanding and returned demonstration    HOME EXERCISE PROGRAM: Access Code: [email or web address] URL: [email or web address] Date:  08/29/2023 Prepared by: [facility] - Outpatient  Rehab - [facility]  Program Notes practice walking inside the house with your cane for 5 minutes per day.   Exercises - Seated Hamstring Curls with Resistance  - 1 x daily - 5 x weekly - 3 sets - 10 reps - Seated Knee Extension with Resistance  - 1 x daily - 5 x weekly - 3 sets - 10 reps - Sit to Stand with Counter Support  - 1 x daily - 5 x weekly - 2 sets - 10 reps - Seated Hip Abduction with Resistance  - 1 x daily - 5 x weekly - 2 sets - 10 reps - Side Stepping with Counter Support  - 1 x daily - 5 x weekly - 2-3 sets - 10 reps - Alternating Step Taps with Counter Support  - 1 x daily - 5 x weekly - 3 sets - 10 reps - Backward Walking with Counter Support  - 1 x daily - 5 x weekly - 1 sets - 5 reps     Note: Objective measures were completed at Evaluation unless otherwise noted.  DIAGNOSTIC FINDINGS: NA for this episode  COGNITION: Overall cognitive status: Within functional limits for tasks assessed   SENSATION: Light touch: WFL Sometimes reports pressure/heaviness in feet  MUSCLE TONE: WFL BLEs   POSTURE: rounded shoulders and forward head  LOWER EXTREMITY ROM:   ACTIVE ROM WFL  LOWER EXTREMITY MMT:    MMT Right Eval Left Eval  Hip flexion 3+ 3+  Hip extension    Hip abduction 4 4  Hip adduction 4 4  Hip internal rotation    Hip external rotation    Knee flexion 3+ 4  Knee extension 4 4  Ankle dorsiflexion 3+ 3+  Ankle plantarflexion    Ankle inversion    Ankle eversion    (Blank rows = not tested)    TRANSFERS: Assistive device utilized: Environmental consultant - 4 wheeled  Sit to stand: Modified independence Stand to sit: Modified independence   GAIT: Gait pattern: step through pattern, decreased step length- Right, decreased stride length, and wide BOS Distance walked: 50 ft x 2 Assistive device utilized: Single point cane and Walker - 4 wheeled Level of assistance: SBA and CGA Comments: Pt brought in  her cane (round tip base with SPC)  FUNCTIONAL TESTS:  5 times sit to stand: 13.63 sec arms crossed at chest Timed up and go (TUG): 17.37 with rollator; 20.91 sec with cane  10 meter walk test: 18.97 sec with rollator (1.73 ft/sec); 27.19 sec with cane (1.21 f/tsec) Berg Balance Scale: 32/56                                                                                                                                 TREATMENT DATE: 08/14/2023      GOALS: Goals reviewed with patient? Yes  SHORT TERM GOALS: Target date: 09/12/2023  Pt will be independent with HEP for improved strength, balance, gait. Baseline: Goal status: IN PROGRESS  2.  Pt will improve 5x sit<>stand to less than or equal to 12 sec to demonstrate improved functional strength and transfer efficiency. Baseline:  13.63 sec Goal status: IN PROGRESS  3.  Pt will improve Berg score to at least 38/56 to decrease fall risk. Baseline: 32/56 Goal status: IN PROGRESS  4.  Pt will improve gait velocity to at least 2 ft/sec with rollator for improved gait efficiency and safety. Baseline: 1.72 ft/sec Goal status: IN PROGRESS  LONG TERM GOALS: Target date: 10/10/2023  Pt will be independent with progression of HEP for improved strength, balance, gait. Baseline:  Goal status: IN PROGRESS  2.  Pt will improve TUG score to less than or equal to 13.5 sec for decreased fall risk. Baseline: 17.37 sec with rollator; >20 sec cane Goal status: IN PROGRESS  3.  Pt will improve Berg score to at least 45/56 to decrease fall risk. Baseline: 32/56 Goal status: IN PROGRESS  4.  Pt will improve gait velocity to at least 1.8 ft/sec with cane for improved gait efficiency and safety.  Baseline:  Goal  status: IN PROGRESS  5.  Pt will verbalize continued community fitness upon d/c from PT. Baseline:  Goal status: IN PROGRESS  ASSESSMENT:  CLINICAL IMPRESSION: Patient arrived to session without complaints. Able to demonstrate  safe floor transfers today with some discomfort in R knee. Gait training and stair training with cane revealed some instability descending steps. Provided HEP update to continue quad strengthening to improve control in stair navigation. Patient reported understanding and without complaints upon leaving.   OBJECTIVE IMPAIRMENTS: Abnormal gait, decreased balance, decreased mobility, difficulty walking, and decreased strength.   ACTIVITY LIMITATIONS: bending, standing, squatting, transfers, bed mobility, and locomotion level  PARTICIPATION LIMITATIONS: driving, shopping, and community activity  PERSONAL FACTORS: 3+ comorbidities: see above  are also affecting patient's functional outcome.   REHAB POTENTIAL: Good  CLINICAL DECISION MAKING: Evolving/moderate complexity  EVALUATION COMPLEXITY: Moderate  PLAN:  PT FREQUENCY: 2x/week  PT DURATION: 8 weeks plus eval visit  PLANNED INTERVENTIONS: 97110-Therapeutic exercises, 97530- Therapeutic activity, [id number]- Neuromuscular re-education, 97535- Self Care, 40981- Manual therapy, [phone]- Gait training, Patient/Family education, and Balance training  PLAN FOR NEXT SESSION: practice car transfers and lifting 4WW into/out of car.  Review HEP updates and progress trunk and hip strengthening, gait training/turns with cane, and balance work    Baldemar Friday, , DPT 09/02/23 12:40 PM  [facility] Rehab at [facility] [address] [place], [place] [address] Phone # [phone] Fax # [phone]

## 2023-09-02 ENCOUNTER — Encounter: Payer: Self-pay | Admitting: Physical Therapy

## 2023-09-02 ENCOUNTER — Ambulatory Visit: Admitting: Physical Therapy

## 2023-09-02 DIAGNOSIS — R2689 Other abnormalities of gait and mobility: Secondary | ICD-10-CM

## 2023-09-02 DIAGNOSIS — R29898 Other symptoms and signs involving the musculoskeletal system: Secondary | ICD-10-CM

## 2023-09-02 DIAGNOSIS — M6281 Muscle weakness (generalized): Secondary | ICD-10-CM | POA: Diagnosis not present

## 2023-09-02 DIAGNOSIS — R2681 Unsteadiness on feet: Secondary | ICD-10-CM

## 2023-09-02 DIAGNOSIS — R262 Difficulty in walking, not elsewhere classified: Secondary | ICD-10-CM

## 2023-09-05 ENCOUNTER — Ambulatory Visit

## 2023-09-05 ENCOUNTER — Other Ambulatory Visit: Payer: Self-pay | Admitting: Physician Assistant

## 2023-09-05 DIAGNOSIS — R2689 Other abnormalities of gait and mobility: Secondary | ICD-10-CM

## 2023-09-05 DIAGNOSIS — R262 Difficulty in walking, not elsewhere classified: Secondary | ICD-10-CM

## 2023-09-05 DIAGNOSIS — M6281 Muscle weakness (generalized): Secondary | ICD-10-CM

## 2023-09-05 DIAGNOSIS — R29898 Other symptoms and signs involving the musculoskeletal system: Secondary | ICD-10-CM

## 2023-09-05 DIAGNOSIS — R2681 Unsteadiness on feet: Secondary | ICD-10-CM

## 2023-09-05 DIAGNOSIS — C3491 Malignant neoplasm of unspecified part of right bronchus or lung: Secondary | ICD-10-CM

## 2023-09-05 NOTE — Therapy (Signed)
 OUTPATIENT PHYSICAL THERAPY NEURO TREATMENT   Patient Name: Hayley Wu MRN: [id number] DOB:25-Aug-1950, 73 y.o., female Today's Date: 09/05/2023   PCP: [name] REFERRING PROVIDER: Scarlett Presto [name], FNP   END OF SESSION:  PT End of Session - 09/05/23 1016     Visit Number 7    Number of Visits 17    Date for PT Re-Evaluation 10/10/23    Authorization Type [employer]    Progress Note Due on Visit 10    PT Start Time 1016    PT Stop Time 1100    PT Time Calculation (min) 44 min    Equipment Utilized During Treatment Gait belt    Activity Tolerance Patient tolerated treatment well    Behavior During Therapy WFL for tasks assessed/performed                 Past Medical History:  Diagnosis Date   Allergy    Contact lens/glasses fitting    Hypertension    Past Surgical History:  Procedure Laterality Date   ABDOMINAL HYSTERECTOMY     BREAST SURGERY  2010   rt lumpectomy-neg   IR IMAGING GUIDED PORT INSERTION  12/18/2022   LAMINECTOMY N/A 09/21/2022   Procedure: THORACIC LAMINECTOMY FOR TUMOR;  Surgeon: Tia Alert, MD;  Location: [facility] OR;  Service: Neurosurgery;  Laterality: N/A;   OPEN REDUCTION INTERNAL FIXATION (ORIF) DISTAL RADIAL FRACTURE Left 11/26/2013   Procedure: OPEN REDUCTION INTERNAL FIXATION (ORIF) LEFT  DISTAL RADIUS ;  Surgeon: Nestor Lewandowsky, MD;  Location: [facility];  Service: Orthopedics;  Laterality: Left;  with block   TUBAL LIGATION     Patient Active Problem List   Diagnosis Date Noted   Acute deep vein thrombosis (DVT) of femoral vein of both lower extremities (HCC) 03/19/2023   Normocytic anemia 03/01/2023   Moderate protein malnutrition (HCC) 03/01/2023   Acute respiratory failure with hypoxia (HCC) 03/01/2023   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   Cardiomegaly 03/01/2023   Pleural effusion due to CHF (congestive heart failure) (HCC) 03/01/2023   Port-A-Cath in place  12/30/2022   Encounter for antineoplastic immunotherapy 11/18/2022   Encounter for antineoplastic chemotherapy 11/18/2022   Constipation 11/05/2022   Goals of care, counseling/discussion 10/21/2022   Cancer, metastatic to bone (HCC) 10/04/2022   Adenocarcinoma of right lung, stage 4 (HCC) 10/01/2022   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 09/20/2022   Hypokalemia 09/20/2022   Lung mass 09/20/2022   Hypothyroidism 09/20/2022   Cholelithiasis 09/20/2022   Lumbar spondylosis 08/26/2022   Thoracic back pain 04/04/2022   Low back pain 04/04/2022   Hypertension 09/23/2011    ONSET DATE: 08/08/2023 (MD order)  REFERRING DIAG:  Z91.81 (ICD-10-CM) - History of falling  C34.91 (ICD-10-CM) - Malignant neoplasm of unspecified part of right bronchus or lung    THERAPY DIAG:  Muscle weakness (generalized)  Other abnormalities of gait and mobility  Unsteadiness on feet  Other symptoms and signs involving the musculoskeletal system  Difficulty in walking, not elsewhere classified  Rationale for Evaluation and Treatment: Rehabilitation  SUBJECTIVE:  SUBJECTIVE STATEMENT: Doing ok, no new issues  Pt accompanied by: self  PERTINENT HISTORY: Stage IV (T1b, N2, M1 C) non-small cell lung cancer, adenocarcinoma presented with right lower lobe lung nodule in addition to small bilateral pulmonary nodules and right hilar and mediastinal lymphadenopathy in addition to liver and bone metastasis diagnosed in April 2024. 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   PAIN:  Are you  having pain? No  PRECAUTIONS: Fall and Other: currently in treatment for lung cancer  RED FLAGS: None   WEIGHT BEARING RESTRICTIONS: No  FALLS: Has patient fallen in last 6 months? No  LIVING ENVIRONMENT: Lives with: lives alone Lives in: House/apartment Stairs: 1 step to get into home Has following equipment at home: Single point cane, Environmental consultant - 4 wheeled, Wheelchair (manual), and shower chair  PLOF: Independent with household mobility with device and Independent with community mobility with device  PATIENT GOALS: Still need to work on balance, endurance, and would like to work on cane more.  OBJECTIVE:   TODAY'S TREATMENT: 09/05/23 Activity Comments  Resisted walking x 2 min retro-forward 10#  Seated hamstring curls 1x10 10# 15#  Step-ups 1x10 4", 8", BHR  Sidestep x 2 min 4#, at counter  Alt toe taps x 2 min 4# at counter  Gait training -spc obstacles: curb, foam, half bolster -ambulation for speed, head turns, vertical      TODAY'S TREATMENT: 09/02/23 Activity Comments  1/2 kneel to stand on airex Cueing for positioning; CGA for safety. Patient did this well  Squat to airex on mat 10x  Good tolerance   Floor transfer with use of mat for support 2x CGA; cueing for positioning; pt performed safely but reported R knee tightness in kneeling position   stair navigation with cane 6x  Without handrail and with CGA-min A for safety; pt requires cues for big kick with R foot when descending. Good stability ascending   Gait training with pt's cane ~117ft Good stability; shoulders guarded       PATIENT EDUCATION: Education details: discussed pt's remaining functional limitations- she reports concerns about car transfers and floor transfers, concerns about getting groceries into the house, balance in the shower if not using transfer chair ; HEP update Person educated: Patient Education method: Explanation, Demonstration, Tactile cues, Verbal cues, and Handouts Education  comprehension: verbalized understanding and returned demonstration    HOME EXERCISE PROGRAM: Access Code: [email or web address] URL: [email or web address] Date: 08/29/2023 Prepared by: [facility] - Outpatient  Rehab - [facility]  Program Notes practice walking inside the house with your cane for 5 minutes per day.   Exercises - Seated Hamstring Curls with Resistance  - 1 x daily - 5 x weekly - 3 sets - 10 reps - Seated Knee Extension with Resistance  - 1 x daily - 5 x weekly - 3 sets - 10 reps - Sit to Stand with Counter Support  - 1 x daily - 5 x weekly - 2 sets - 10 reps - Seated Hip Abduction with Resistance  - 1 x daily - 5 x weekly - 2 sets - 10 reps - Side Stepping with Counter Support  - 1 x daily - 5 x weekly - 2-3 sets - 10 reps - Alternating Step Taps with Counter Support  - 1 x daily - 5 x weekly - 3 sets - 10 reps - Backward Walking with Counter Support  - 1  x daily - 5 x weekly - 1 sets - 5 reps     Note: Objective measures were completed at Evaluation unless otherwise noted.  DIAGNOSTIC FINDINGS: NA for this episode  COGNITION: Overall cognitive status: Within functional limits for tasks assessed   SENSATION: Light touch: WFL Sometimes reports pressure/heaviness in feet  MUSCLE TONE: WFL BLEs   POSTURE: rounded shoulders and forward head  LOWER EXTREMITY ROM:   ACTIVE ROM WFL  LOWER EXTREMITY MMT:    MMT Right Eval Left Eval  Hip flexion 3+ 3+  Hip extension    Hip abduction 4 4  Hip adduction 4 4  Hip internal rotation    Hip external rotation    Knee flexion 3+ 4  Knee extension 4 4  Ankle dorsiflexion 3+ 3+  Ankle plantarflexion    Ankle inversion    Ankle eversion    (Blank rows = not tested)    TRANSFERS: Assistive device utilized: Environmental consultant - 4 wheeled  Sit to stand: Modified independence Stand to sit: Modified independence   GAIT: Gait pattern: step through pattern, decreased step length- Right, decreased stride length, and  wide BOS Distance walked: 50 ft x 2 Assistive device utilized: Single point cane and Walker - 4 wheeled Level of assistance: SBA and CGA Comments: Pt brought in her cane (round tip base with SPC)  FUNCTIONAL TESTS:  5 times sit to stand: 13.63 sec arms crossed at chest Timed up and go (TUG): 17.37 with rollator; 20.91 sec with cane  10 meter walk test: 18.97 sec with rollator (1.73 ft/sec); 27.19 sec with cane (1.21 f/tsec) Berg Balance Scale: 32/56                                                                                                                                 TREATMENT DATE: 08/14/2023      GOALS: Goals reviewed with patient? Yes  SHORT TERM GOALS: Target date: 09/12/2023  Pt will be independent with HEP for improved strength, balance, gait. Baseline: Goal status: IN PROGRESS  2.  Pt will improve 5x sit<>stand to less than or equal to 12 sec to demonstrate improved functional strength and transfer efficiency. Baseline:  13.63 sec Goal status: IN PROGRESS  3.  Pt will improve Berg score to at least 38/56 to decrease fall risk. Baseline: 32/56 Goal status: IN PROGRESS  4.  Pt will improve gait velocity to at least 2 ft/sec with rollator for improved gait efficiency and safety. Baseline: 1.72 ft/sec Goal status: IN PROGRESS  LONG TERM GOALS: Target date: 10/10/2023  Pt will be independent with progression of HEP for improved strength, balance, gait. Baseline:  Goal status: IN PROGRESS  2.  Pt will improve TUG score to less than or equal to 13.5 sec for decreased fall risk. Baseline: 17.37 sec with rollator; >20 sec cane Goal status: IN PROGRESS  3.  Pt will improve Berg score to at least 45/56 to  decrease fall risk. Baseline: 32/56 Goal status: IN PROGRESS  4.  Pt will improve gait velocity to at least 1.8 ft/sec with cane for improved gait efficiency and safety.  Baseline:  Goal status: IN PROGRESS  5.  Pt will verbalize continued community fitness  upon d/c from PT. Baseline:  Goal status: IN PROGRESS  ASSESSMENT:  CLINICAL IMPRESSION: Session focus on improving LE power/strength/endurance for activities such as stair ambulation and enhancing single limb support to progress gait training w/ cane.  Able to tolerate increased height for step-ups to 8" with BHR and for benefit of eccentric lowering in reverse to improve control for stair/curb negotiation.  Generalized these movements to gait training w/ cane with emphasis on stepping up/down from curb heights, managing uneven surfaces, and stepping over obstacles with SBA-CGA during tasks. Unable to change gait speed with cane and proceeded with activities for head movements with walking to improve safety with AD and efficiency for community ambulation needing CGA with walking and head turns due to imbalance.  Demo of "space saver" 4WW to improve ease of getting AD in/out of car.  Continued sessions to advance POC details to improve functional independence and reduce risk for falls  OBJECTIVE IMPAIRMENTS: Abnormal gait, decreased balance, decreased mobility, difficulty walking, and decreased strength.   ACTIVITY LIMITATIONS: bending, standing, squatting, transfers, bed mobility, and locomotion level  PARTICIPATION LIMITATIONS: driving, shopping, and community activity  PERSONAL FACTORS: 3+ comorbidities: see above  are also affecting patient's functional outcome.   REHAB POTENTIAL: Good  CLINICAL DECISION MAKING: Evolving/moderate complexity  EVALUATION COMPLEXITY: Moderate  PLAN:  PT FREQUENCY: 2x/week  PT DURATION: 8 weeks plus eval visit  PLANNED INTERVENTIONS: 97110-Therapeutic exercises, 97530- Therapeutic activity, [id number]- Neuromuscular re-education, 97535- Self Care, 16109- Manual therapy, [phone]- Gait training, Patient/Family education, and Balance training  PLAN FOR NEXT SESSION: practice car transfers and lifting 4WW into/out of car.  Review HEP updates and progress trunk  and hip strengthening, gait training/turns with cane, and balance work   11:13 AM, 09/05/23 M. Shary Decamp, PT, DPT Physical Therapist- [place] Office Number: [phone]

## 2023-09-09 ENCOUNTER — Ambulatory Visit: Attending: Family Medicine

## 2023-09-09 DIAGNOSIS — R2689 Other abnormalities of gait and mobility: Secondary | ICD-10-CM | POA: Insufficient documentation

## 2023-09-09 DIAGNOSIS — R2681 Unsteadiness on feet: Secondary | ICD-10-CM | POA: Insufficient documentation

## 2023-09-09 DIAGNOSIS — M6281 Muscle weakness (generalized): Secondary | ICD-10-CM | POA: Diagnosis not present

## 2023-09-09 DIAGNOSIS — R29898 Other symptoms and signs involving the musculoskeletal system: Secondary | ICD-10-CM | POA: Insufficient documentation

## 2023-09-09 DIAGNOSIS — R262 Difficulty in walking, not elsewhere classified: Secondary | ICD-10-CM | POA: Insufficient documentation

## 2023-09-09 NOTE — Therapy (Signed)
 OUTPATIENT PHYSICAL THERAPY NEURO TREATMENT   Patient Name: Hayley Wu MRN: [id number] DOB:1951/01/24, 73 y.o., female Today's Date: 09/09/2023   PCP: [name] REFERRING PROVIDER: Scarlett Presto [name], FNP   END OF SESSION:  PT End of Session - 09/09/23 1015     Visit Number 8    Number of Visits 17    Date for PT Re-Evaluation 10/10/23    Authorization Type [employer]    Progress Note Due on Visit 10    PT Start Time 1015    PT Stop Time 1100    PT Time Calculation (min) 45 min    Equipment Utilized During Treatment Gait belt    Activity Tolerance Patient tolerated treatment well    Behavior During Therapy WFL for tasks assessed/performed                 Past Medical History:  Diagnosis Date   Allergy    Contact lens/glasses fitting    Hypertension    Past Surgical History:  Procedure Laterality Date   ABDOMINAL HYSTERECTOMY     BREAST SURGERY  2010   rt lumpectomy-neg   IR IMAGING GUIDED PORT INSERTION  12/18/2022   LAMINECTOMY N/A 09/21/2022   Procedure: THORACIC LAMINECTOMY FOR TUMOR;  Surgeon: Tia Alert, MD;  Location: [facility] OR;  Service: Neurosurgery;  Laterality: N/A;   OPEN REDUCTION INTERNAL FIXATION (ORIF) DISTAL RADIAL FRACTURE Left 11/26/2013   Procedure: OPEN REDUCTION INTERNAL FIXATION (ORIF) LEFT  DISTAL RADIUS ;  Surgeon: Nestor Lewandowsky, MD;  Location: [facility];  Service: Orthopedics;  Laterality: Left;  with block   TUBAL LIGATION     Patient Active Problem List   Diagnosis Date Noted   Acute deep vein thrombosis (DVT) of femoral vein of both lower extremities (HCC) 03/19/2023   Normocytic anemia 03/01/2023   Moderate protein malnutrition (HCC) 03/01/2023   Acute respiratory failure with hypoxia (HCC) 03/01/2023   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   Cardiomegaly 03/01/2023   Pleural effusion due to CHF (congestive heart failure) (HCC) 03/01/2023   Port-A-Cath in place 12/30/2022    Encounter for antineoplastic immunotherapy 11/18/2022   Encounter for antineoplastic chemotherapy 11/18/2022   Constipation 11/05/2022   Goals of care, counseling/discussion 10/21/2022   Cancer, metastatic to bone (HCC) 10/04/2022   Adenocarcinoma of right lung, stage 4 (HCC) 10/01/2022   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 09/20/2022   Hypokalemia 09/20/2022   Lung mass 09/20/2022   Hypothyroidism 09/20/2022   Cholelithiasis 09/20/2022   Lumbar spondylosis 08/26/2022   Thoracic back pain 04/04/2022   Low back pain 04/04/2022   Hypertension 09/23/2011    ONSET DATE: 08/08/2023 (MD order)  REFERRING DIAG:  Z91.81 (ICD-10-CM) - History of falling  C34.91 (ICD-10-CM) - Malignant neoplasm of unspecified part of right bronchus or lung    THERAPY DIAG:  Muscle weakness (generalized)  Other abnormalities of gait and mobility  Unsteadiness on feet  Other symptoms and signs involving the musculoskeletal system  Difficulty in walking, not elsewhere classified  Rationale for Evaluation and Treatment: Rehabilitation  SUBJECTIVE:  SUBJECTIVE STATEMENT: Feeling ok, no issues.   Pt accompanied by: self  PERTINENT HISTORY: Stage IV (T1b, N2, M1 C) non-small cell lung cancer, adenocarcinoma presented with right lower lobe lung nodule in addition to small bilateral pulmonary nodules and right hilar and mediastinal lymphadenopathy in addition to liver and bone metastasis diagnosed in April 2024. 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   PAIN:  Are you having  pain? No  PRECAUTIONS: Fall and Other: currently in treatment for lung cancer  RED FLAGS: None   WEIGHT BEARING RESTRICTIONS: No  FALLS: Has patient fallen in last 6 months? No  LIVING ENVIRONMENT: Lives with: lives alone Lives in: House/apartment Stairs: 1 step to get into home Has following equipment at home: Single point cane, Environmental consultant - 4 wheeled, Wheelchair (manual), and shower chair  PLOF: Independent with household mobility with device and Independent with community mobility with device  PATIENT GOALS: Still need to work on balance, endurance, and would like to work on cane more.  OBJECTIVE:   TODAY'S TREATMENT: 09/09/23 Activity Comments  Seated LE PRE 3x10, 5# -hip add iso -LAQ -hip abd green loop -march  Hamstring curls 3x10 15# cable  Gait training -6" curb w/ cane and HR and CGA-SBA for 3 reps, without HR x 3 reps -8" curb x 3 reps w/ cane and HR x 2 reps, no HR x 1 rep, CGA -step ups 8": 2x10 reps cane+HR ---HR to 131 bpm, 90% O2 after  Seated rest x 2 min 97%, 113 bpm  Gait training -stepping over 6" hurdles, 180 deg pivot turn on airex pad  -lateral step over 6" hurdles             PATIENT EDUCATION: Education details: discussed pt's remaining functional limitations- she reports concerns about car transfers and floor transfers, concerns about getting groceries into the house, balance in the shower if not using transfer chair ; HEP update Person educated: Patient Education method: Explanation, Demonstration, Tactile cues, Verbal cues, and Handouts Education comprehension: verbalized understanding and returned demonstration    HOME EXERCISE PROGRAM: Access Code: [email or web address] URL: [email or web address] Date: 08/29/2023 Prepared by: [facility] - Outpatient  Rehab - [facility]  Program Notes practice walking inside the house with your cane for 5 minutes per day.   Exercises - Seated Hamstring Curls with Resistance  - 1 x daily - 5 x  weekly - 3 sets - 10 reps - Seated Knee Extension with Resistance  - 1 x daily - 5 x weekly - 3 sets - 10 reps - Sit to Stand with Counter Support  - 1 x daily - 5 x weekly - 2 sets - 10 reps - Seated Hip Abduction with Resistance  - 1 x daily - 5 x weekly - 2 sets - 10 reps - Side Stepping with Counter Support  - 1 x daily - 5 x weekly - 2-3 sets - 10 reps - Alternating Step Taps with Counter Support  - 1 x daily - 5 x weekly - 3 sets - 10 reps - Backward Walking with Counter Support  - 1 x daily - 5 x weekly - 1 sets - 5 reps     Note: Objective measures were completed at Evaluation unless otherwise noted.  DIAGNOSTIC FINDINGS: NA for this episode  COGNITION: Overall cognitive status: Within functional limits for tasks assessed   SENSATION: Light touch: WFL Sometimes reports pressure/heaviness  in feet  MUSCLE TONE: WFL BLEs   POSTURE: rounded shoulders and forward head  LOWER EXTREMITY ROM:   ACTIVE ROM WFL  LOWER EXTREMITY MMT:    MMT Right Eval Left Eval  Hip flexion 3+ 3+  Hip extension    Hip abduction 4 4  Hip adduction 4 4  Hip internal rotation    Hip external rotation    Knee flexion 3+ 4  Knee extension 4 4  Ankle dorsiflexion 3+ 3+  Ankle plantarflexion    Ankle inversion    Ankle eversion    (Blank rows = not tested)    TRANSFERS: Assistive device utilized: Environmental consultant - 4 wheeled  Sit to stand: Modified independence Stand to sit: Modified independence   GAIT: Gait pattern: step through pattern, decreased step length- Right, decreased stride length, and wide BOS Distance walked: 50 ft x 2 Assistive device utilized: Single point cane and Walker - 4 wheeled Level of assistance: SBA and CGA Comments: Pt brought in her cane (round tip base with SPC)  FUNCTIONAL TESTS:  5 times sit to stand: 13.63 sec arms crossed at chest Timed up and go (TUG): 17.37 with rollator; 20.91 sec with cane  10 meter walk test: 18.97 sec with rollator (1.73 ft/sec);  27.19 sec with cane (1.21 f/tsec) Berg Balance Scale: 32/56                                                                                                                                 TREATMENT DATE: 08/14/2023      GOALS: Goals reviewed with patient? Yes  SHORT TERM GOALS: Target date: 09/12/2023  Pt will be independent with HEP for improved strength, balance, gait. Baseline: Goal status: IN PROGRESS  2.  Pt will improve 5x sit<>stand to less than or equal to 12 sec to demonstrate improved functional strength and transfer efficiency. Baseline:  13.63 sec Goal status: IN PROGRESS  3.  Pt will improve Berg score to at least 38/56 to decrease fall risk. Baseline: 32/56 Goal status: IN PROGRESS  4.  Pt will improve gait velocity to at least 2 ft/sec with rollator for improved gait efficiency and safety. Baseline: 1.72 ft/sec Goal status: IN PROGRESS  LONG TERM GOALS: Target date: 10/10/2023  Pt will be independent with progression of HEP for improved strength, balance, gait. Baseline:  Goal status: IN PROGRESS  2.  Pt will improve TUG score to less than or equal to 13.5 sec for decreased fall risk. Baseline: 17.37 sec with rollator; >20 sec cane Goal status: IN PROGRESS  3.  Pt will improve Berg score to at least 45/56 to decrease fall risk. Baseline: 32/56 Goal status: IN PROGRESS  4.  Pt will improve gait velocity to at least 1.8 ft/sec with cane for improved gait efficiency and safety.  Baseline:  Goal status: IN PROGRESS  5.  Pt will verbalize continued community fitness upon d/c from PT. Baseline:  Goal  status: IN PROGRESS  ASSESSMENT:  CLINICAL IMPRESSION: Initiated session with seated LE PRE to improve strength/power/activity tolerance with increase in resistance and repetitions for all activities and tolerated this very well.  Proceeded with focus to gait training w/ cane for ascending/descending curbs/stairs and repetition focus for improved LE endurance  for these demands to improve safety in community.  Cues for sequence initially needed for cane and descending curb but able to minimize need for HR support and able to manage with cane and SBA-CGA 100% of trials.  Reinforced gait  trianing w/ cane to improve ability to step over obstacles and make sharp turns in small space to improve safety with household and community ambulation.  Continued sessions to progress functional mobility to achieve modified independence with low risk for falls  OBJECTIVE IMPAIRMENTS: Abnormal gait, decreased balance, decreased mobility, difficulty walking, and decreased strength.   ACTIVITY LIMITATIONS: bending, standing, squatting, transfers, bed mobility, and locomotion level  PARTICIPATION LIMITATIONS: driving, shopping, and community activity  PERSONAL FACTORS: 3+ comorbidities: see above  are also affecting patient's functional outcome.   REHAB POTENTIAL: Good  CLINICAL DECISION MAKING: Evolving/moderate complexity  EVALUATION COMPLEXITY: Moderate  PLAN:  PT FREQUENCY: 2x/week  PT DURATION: 8 weeks plus eval visit  PLANNED INTERVENTIONS: 97110-Therapeutic exercises, 97530- Therapeutic activity, [id number]- Neuromuscular re-education, 97535- Self Care, 95621- Manual therapy, [phone]- Gait training, Patient/Family education, and Balance training  PLAN FOR NEXT SESSION: practice car transfers and lifting 4WW into/out of car.  Review HEP updates and progress trunk and hip strengthening, gait training/turns with cane, and balance work   10:15 AM, 09/09/23 M. Shary Decamp, PT, DPT Physical Therapist- [place] Office Number: [phone]

## 2023-09-12 ENCOUNTER — Ambulatory Visit: Admitting: Physical Therapy

## 2023-09-17 ENCOUNTER — Inpatient Hospital Stay (HOSPITAL_BASED_OUTPATIENT_CLINIC_OR_DEPARTMENT_OTHER): Payer: PPO | Admitting: Internal Medicine

## 2023-09-17 ENCOUNTER — Inpatient Hospital Stay: Payer: PPO

## 2023-09-17 ENCOUNTER — Inpatient Hospital Stay: Payer: PPO | Attending: Internal Medicine

## 2023-09-17 VITALS — HR 96

## 2023-09-17 VITALS — BP 118/83 | HR 104 | Temp 98.2°F | Resp 16 | Ht 63.0 in | Wt 162.2 lb

## 2023-09-17 DIAGNOSIS — C3431 Malignant neoplasm of lower lobe, right bronchus or lung: Secondary | ICD-10-CM | POA: Diagnosis not present

## 2023-09-17 DIAGNOSIS — C787 Secondary malignant neoplasm of liver and intrahepatic bile duct: Secondary | ICD-10-CM | POA: Insufficient documentation

## 2023-09-17 DIAGNOSIS — E079 Disorder of thyroid, unspecified: Secondary | ICD-10-CM | POA: Diagnosis not present

## 2023-09-17 DIAGNOSIS — Z5111 Encounter for antineoplastic chemotherapy: Secondary | ICD-10-CM | POA: Insufficient documentation

## 2023-09-17 DIAGNOSIS — C3491 Malignant neoplasm of unspecified part of right bronchus or lung: Secondary | ICD-10-CM

## 2023-09-17 DIAGNOSIS — E032 Hypothyroidism due to medicaments and other exogenous substances: Secondary | ICD-10-CM

## 2023-09-17 DIAGNOSIS — C7951 Secondary malignant neoplasm of bone: Secondary | ICD-10-CM | POA: Insufficient documentation

## 2023-09-17 DIAGNOSIS — Z95828 Presence of other vascular implants and grafts: Secondary | ICD-10-CM

## 2023-09-17 DIAGNOSIS — J704 Drug-induced interstitial lung disorders, unspecified: Secondary | ICD-10-CM | POA: Diagnosis not present

## 2023-09-17 LAB — CBC WITH DIFFERENTIAL (CANCER CENTER ONLY)
Abs Immature Granulocytes: 0.01 10*3/uL (ref 0.00–0.07)
Basophils Absolute: 0 10*3/uL (ref 0.0–0.1)
Basophils Relative: 1 %
Eosinophils Absolute: 0.1 10*3/uL (ref 0.0–0.5)
Eosinophils Relative: 2 %
HCT: 38.1 % (ref 36.0–46.0)
Hemoglobin: 12.8 g/dL (ref 12.0–15.0)
Immature Granulocytes: 0 %
Lymphocytes Relative: 14 %
Lymphs Abs: 0.5 10*3/uL — ABNORMAL LOW (ref 0.7–4.0)
MCH: 29.9 pg (ref 26.0–34.0)
MCHC: 33.6 g/dL (ref 30.0–36.0)
MCV: 89 fL (ref 80.0–100.0)
Monocytes Absolute: 0.4 10*3/uL (ref 0.1–1.0)
Monocytes Relative: 11 %
Neutro Abs: 2.6 10*3/uL (ref 1.7–7.7)
Neutrophils Relative %: 72 %
Platelet Count: 217 10*3/uL (ref 150–400)
RBC: 4.28 MIL/uL (ref 3.87–5.11)
RDW: 13.1 % (ref 11.5–15.5)
WBC Count: 3.7 10*3/uL — ABNORMAL LOW (ref 4.0–10.5)
nRBC: 0 % (ref 0.0–0.2)

## 2023-09-17 LAB — CMP (CANCER CENTER ONLY)
ALT: 11 U/L (ref 0–44)
AST: 15 U/L (ref 15–41)
Albumin: 4.1 g/dL (ref 3.5–5.0)
Alkaline Phosphatase: 82 U/L (ref 38–126)
Anion gap: 8 (ref 5–15)
BUN: 9 mg/dL (ref 8–23)
CO2: 29 mmol/L (ref 22–32)
Calcium: 9.2 mg/dL (ref 8.9–10.3)
Chloride: 105 mmol/L (ref 98–111)
Creatinine: 0.67 mg/dL (ref 0.44–1.00)
GFR, Estimated: >60 mL/min (ref >60)
Glucose, Bld: 136 mg/dL — ABNORMAL HIGH (ref 70–99)
Potassium: 3.5 mmol/L (ref 3.5–5.1)
Sodium: 142 mmol/L (ref 135–145)
Total Bilirubin: 0.4 mg/dL (ref 0.0–1.2)
Total Protein: 7 g/dL (ref 6.5–8.1)

## 2023-09-17 MED ORDER — DEXAMETHASONE SODIUM PHOSPHATE 10 MG/ML IJ SOLN
10.0000 mg | Freq: Once | INTRAMUSCULAR | Status: AC
  Administered 2023-09-17: 10 mg via INTRAVENOUS
  Filled 2023-09-17: qty 1

## 2023-09-17 MED ORDER — PROCHLORPERAZINE MALEATE 10 MG PO TABS
10.0000 mg | ORAL_TABLET | Freq: Once | ORAL | Status: AC
Start: 2023-09-17 — End: 2023-09-17
  Administered 2023-09-17: 10 mg via ORAL
  Filled 2023-09-17: qty 1

## 2023-09-17 MED ORDER — SODIUM CHLORIDE 0.9% FLUSH
10.0000 mL | INTRAVENOUS | Status: DC | PRN
  Administered 2023-09-17: 10 mL

## 2023-09-17 MED ORDER — HEPARIN SOD (PORK) LOCK FLUSH 100 UNIT/ML IV SOLN
500.0000 [IU] | Freq: Once | INTRAVENOUS | Status: AC | PRN
  Administered 2023-09-17: 500 [IU]

## 2023-09-17 MED ORDER — SODIUM CHLORIDE 0.9% FLUSH
10.0000 mL | Freq: Once | INTRAVENOUS | Status: AC
Start: 2023-09-17 — End: 2023-09-17
  Administered 2023-09-17: 10 mL

## 2023-09-17 MED ORDER — SODIUM CHLORIDE 0.9 % IV SOLN
500.0000 mg/m2 | Freq: Once | INTRAVENOUS | Status: AC
  Administered 2023-09-17: 900 mg via INTRAVENOUS
  Filled 2023-09-17: qty 20

## 2023-09-17 MED ORDER — SODIUM CHLORIDE 0.9 % IV SOLN: Freq: Once | INTRAVENOUS | Status: AC

## 2023-09-17 NOTE — Progress Notes (Signed)
 [facility] [phone]   [phone]  OFFICE PROGRESS NOTE  Stamey, [name], FNP [address] [place] [address]  DIAGNOSIS: Stage IV (T1b, N2, M1 C) non-small cell lung cancer, adenocarcinoma presented with right lower lobe lung nodule in addition to small bilateral pulmonary nodules and right hilar and mediastinal lymphadenopathy in addition to liver and bone metastasis diagnosed in April 2024. 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   PDL1: 20%   Guardant 360: no actionable mutations.    PRIOR THERAPY: 1) Decompressive thoracic laminectomy of the T7 with spinal cord decompression and biopsy of the epidural mass in April 2024.  2) Palliative radiation to the bone under the care of Dr. Basilio Cairo. Last day scheduled for 10/25/22    CURRENT THERAPY: Carboplatin for an AUC of 5, Alimta 500 mg/m, Keytruda 200 mg IV every 3 weeks.  First dose expected on 10/28/2022. Status post 13 cycles.  Starting from cycle #5 the patient is on maintenance treatment with Alimta and Keytruda every 3 weeks.  Starting from cycle #7 the patient will be treated with single agent Alimta 500 Mg/M2 every 3 weeks.  Rande Lawman was discontinued secondary to suspicious immunotherapy mediated pneumonitis.  INTERVAL HISTORY: Hayley Wu 73 y.o. female returns to the clinic today for follow-up visit, by her daughter [name].Discussed the use of AI scribe software for clinical note transcription with the patient, who gave verbal consent to proceed.  History of Present Illness   Hayley Wu is a 73 year old female with stage four non-small cell lung cancer who presents for evaluation before starting cycle fourteen of her treatment.  She was diagnosed with stage four non-small cell lung cancer, adenocarcinoma, in April 2021, with PD-L1 expression of 20% and no actionable  mutations. Initially, she received systemic chemotherapy with carboplatin, Alimta, and Keytruda for four cycles. Currently, she is on maintenance treatment with single-agent Alimta starting from cycle seven. 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  She has no new complaints since her last visit three weeks ago. She experiences increased fatigue typically the week following chemotherapy, with improvement by the weekend. She remains active and continues with physical therapy. She had a recent episode of a stomach bug last week, which resolved by the weekend with anti-nausea medication. No new fatigue, chest pain, or breathing issues. She feels more tired the week after chemotherapy but recovers by the weekend.  She is on medication for her thyroid, which was recently adjusted by her nurse practitioner due to elevated levels. She requests to have her thyroid levels checked during her next lab visit.        MEDICAL HISTORY: Past Medical History:  Diagnosis Date   Allergy    Contact lens/glasses fitting    Hypertension     ALLERGIES:  is allergic to erythromycin, amoxicillin, latex, and prednisone.  MEDICATIONS:  Current Outpatient Medications  Medication Sig Dispense Refill   acetaminophen (TYLENOL) 500 MG tablet Take 500 mg by mouth every 6 (six) hours as needed for moderate pain.     amLODipine (NORVASC) 10 MG tablet Take 10 mg by mouth daily.     apixaban (ELIQUIS) 5 MG TABS tablet Take 1 tablet (5 mg total) by mouth 2 (two) times daily. 60 tablet 2   folic acid (FOLVITE) 1 MG tablet Take 1 tablet (1 mg  total) by mouth daily. 90 tablet 1   Ipratropium-Albuterol (COMBIVENT) 20-100 MCG/ACT AERS respimat Inhale 1 puff into the lungs every 6 (six) hours as needed for wheezing or shortness of breath. 4 g 1   levothyroxine (SYNTHROID) 112 MCG tablet Take 112 mcg by mouth daily before breakfast.     lidocaine-prilocaine (EMLA) cream Apply 1 Application  topically as needed. 30 g 2   mirtazapine (REMERON) 30 MG tablet TAKE 1 TABLET BY MOUTH AT BEDTIME 90 tablet 0   potassium chloride SA (KLOR-CON M) 20 MEQ tablet Take 1 tablet (20 mEq total) by mouth daily. (Patient not taking: Reported on 08/14/2023) 6 tablet 0   prochlorperazine (COMPAZINE) 10 MG tablet Take 1 tablet (10 mg total) by mouth every 6 (six) hours as needed. (Patient taking differently: Take 10 mg by mouth every 6 (six) hours as needed for refractory nausea / vomiting.) 30 tablet 2   VITAMIN D PO Take 1 tablet by mouth daily.     No current facility-administered medications for this visit.    SURGICAL HISTORY:  Past Surgical History:  Procedure Laterality Date   ABDOMINAL HYSTERECTOMY     BREAST SURGERY  2010   rt lumpectomy-neg   IR IMAGING GUIDED PORT INSERTION  12/18/2022   LAMINECTOMY N/A 09/21/2022   Procedure: THORACIC LAMINECTOMY FOR TUMOR;  Surgeon: Tia Alert, MD;  Location: [facility] OR;  Service: Neurosurgery;  Laterality: N/A;   OPEN REDUCTION INTERNAL FIXATION (ORIF) DISTAL RADIAL FRACTURE Left 11/26/2013   Procedure: OPEN REDUCTION INTERNAL FIXATION (ORIF) LEFT  DISTAL RADIUS ;  Surgeon: Nestor Lewandowsky, MD;  Location: [facility];  Service: Orthopedics;  Laterality: Left;  with block   TUBAL LIGATION      REVIEW OF SYSTEMS:  A comprehensive review of systems was negative except for: Constitutional: positive for fatigue   PHYSICAL EXAMINATION: General appearance: alert, cooperative, and no distress Head: Normocephalic, without obvious abnormality, atraumatic Neck: no adenopathy, no JVD, supple, symmetrical, trachea midline, and thyroid not enlarged, symmetric, no tenderness/mass/nodules Lymph nodes: Cervical, supraclavicular, and axillary nodes normal. Resp: clear to auscultation bilaterally Back: symmetric, no curvature. ROM normal. No CVA tenderness. Cardio: regular rate and rhythm, S1, S2 normal, no murmur, click, rub or gallop GI: soft, non-tender;  bowel sounds normal; no masses,  no organomegaly Extremities: extremities normal, atraumatic, no cyanosis or edema  ECOG PERFORMANCE STATUS: 1 - Symptomatic but completely ambulatory  Blood pressure 118/83, pulse (!) 104, temperature 98.2 F (36.8 C), temperature source Temporal, resp. rate 16, height 5\' 3"  (1.6 m), weight 162 lb 3.2 oz (73.6 kg), SpO2 98%.  LABORATORY DATA: Lab Results  Component Value Date   WBC 3.7 (L) 09/17/2023   HGB 12.8 09/17/2023   HCT 38.1 09/17/2023   MCV 89.0 09/17/2023   PLT 217 09/17/2023      Chemistry      Component Value Date/Time   NA 142 08/27/2023 1101   K 3.8 08/27/2023 1101   CL 108 08/27/2023 1101   CO2 27 08/27/2023 1101   BUN 13 08/27/2023 1101   CREATININE 0.69 08/27/2023 1101      Component Value Date/Time   CALCIUM 9.4 08/27/2023 1101   ALKPHOS 90 08/27/2023 1101   AST 19 08/27/2023 1101   ALT 16 08/27/2023 1101   BILITOT 0.5 08/27/2023 1101       RADIOGRAPHIC STUDIES: CT CHEST ABDOMEN PELVIS W CONTRAST Result Date: 08/20/2023 CLINICAL DATA:  Non-small-cell lung cancer staging. * Tracking Code: BO * EXAM:  CT CHEST, ABDOMEN, AND PELVIS WITH CONTRAST TECHNIQUE: Multidetector CT imaging of the chest, abdomen and pelvis was performed following the standard protocol during bolus administration of intravenous contrast. RADIATION DOSE REDUCTION: This exam was performed according to the departmental dose-optimization program which includes automated exposure control, adjustment of the mA and/or kV according to patient size and/or use of iterative reconstruction technique. CONTRAST:  OMNIPAQUE IOHEXOL 300 MG/ML  SOLN COMPARISON:  06/10/2023. FINDINGS: CT CHEST FINDINGS Cardiovascular: Right upper chest port again identified. Tip extends to the right atrium. The port is accessed. Moderate pericardial effusion is stable. Coronary calcifications are seen. The thoracic aorta is normal course and caliber with partially calcified plaque.  Mediastinum/Nodes: Small heterogeneous thyroid gland is stable. Patulous appearance to the esophagus with wall thickening diffusely, unchanged. There is edema in the mediastinum. No specific abnormal lymph node enlargement identified in the axillary regions, hilum or mediastinum. Stable small mediastinal nodes are identified. The previous node anterior to the carina which measured 9 mm in short axis, today measures 9 mm on series 2, image 22, unchanged. Lungs/Pleura: Decreasing small right pleural effusion. Trace left pleural effusion is similar. Once again lungs have areas of interstitial septal thickening including areas of scarring fibrotic changes. Bandlike opacity is seen adjacent to the mediastinum are stable and could be posttreatment related. Underlying emphysematous lung changes identified once again greatest in the upper lung zones. Persistent confluent opacity medially in the right lower lobe with distortion and air bronchograms, unchanged. The right lower lobe spiculated nodular area which measured 15 x 12 mm on the prior examination, today measures 14 by 11 mm on series 4, image 78, not significantly changed. The right apical lesion which measured 7 mm on the prior examination, today measures 7 mm on series 4, image 38. Few other tiny areas of nodularity are also stable bilaterally. Musculoskeletal: Degenerative changes again seen along the spine. There is again severe compression along the midthoracic spine with kyphosis and surgical changes from laminectomy. Associated areas of bony sclerosis at these levels as well as elsewhere in the thoracic spine consistent with known osseous metastatic disease. By CT the extent and distribution appears similar to the prior examination. CT ABDOMEN PELVIS FINDINGS Hepatobiliary: Large stone the distended gallbladder. Unchanged. Stable ectasia of the biliary tree but normal tapering of the common duct towards the pancreas. Stable benign cystic lesions in liver. The  more complex focus inferiorly on the right side which measured 11 mm on the prior, today on series 2, image 60 measures 10 mm, not significantly changed. Patent portal vein. Pancreas: Focal atrophy of the pancreas.  No obvious mass. Spleen: Normal in size without focal abnormality. Adrenals/Urinary Tract: The adrenal glands are preserved. Mild bilateral renal atrophy. No enhancing renal mass or collecting system dilatation. The ureters have normal course and caliber extending down to the urinary bladder. Preserved contour to the urinary bladder. Stomach/Bowel: Large bowel has a normal course and caliber. Scattered colonic stool. Left-sided colonic diverticula. Normal appendix. Nonspecific distal small bowel stool appearance. Vascular/Lymphatic: Aortic atherosclerosis. No enlarged abdominal or pelvic lymph nodes. Reproductive: Status post hysterectomy. No adnexal masses. Other: Anasarca.  No free air or free fluid. Musculoskeletal: Once again there is some scattered sclerotic lesions. Degenerative changes. Areas of stenosis along the lumbar spine. Multilevel trace listhesis along the lumbar spine. IMPRESSION: Overall no significant interval change. Stable spiculated nodules in the right lung. Persistent pleural effusion on the right. Stable hepatic cystic foci. Gallstone in the dilated gallbladder. Unchanged from previous.  Please correlate with history and symptoms. Multifocal osseous sclerotic lesions. Stable compression deformities along the midthoracic spine with kyphosis, stenosis and surgical change. Persistent moderate pericardial effusion. Mild wall thickening along the esophagus. Please correlate with symptoms. Electronically Signed   By: Karen Kays M.D.   On: 08/20/2023 16:15    ASSESSMENT AND PLAN: This is a very pleasant 73 years old white female with Stage IV (T1b, N2, M1 C) non-small cell lung cancer, adenocarcinoma presented with right lower lobe lung nodule in addition to small bilateral pulmonary  nodules and right hilar and mediastinal lymphadenopathy in addition to liver and bone metastasis diagnosed in April 2024. 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 Molecular studies showed no actionable mutations and PD-L1 expression of 20%. 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  She also had palliative radiation to the bone under the care of Dr. Basilio Cairo. Last day scheduled for 10/25/22. She is currently on palliative systemic chemotherapy with carboplatin for AUC of 5, Alimta 500 Mg/M2 and Keytruda 200 Mg IV every 3 weeks status post 13 cycles.  Starting from cycle #5 the patient is on maintenance treatment with Alimta and Keytruda every 3 weeks.  After cycle #6 Keytruda was discontinued secondary to suspicious immunotherapy mediated pneumonitis with recent hospitalization and acute respiratory failure. The patient has been tolerating her treatment with Alimta fairly well.    Stage IV non-small cell lung cancer, adenocarcinoma Diagnosed in April 2021 with PD-L1 expression of 20% and no actionable mutations. Currently on maintenance treatment with Alimta, starting from cycle 7. 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 Experiences post-chemotherapy fatigue resolving by week's end. Recent labs satisfactory for treatment continuation. Imaging reassessment planned after one or two more cycles. - Proceed with cycle 14 of Alimta treatment - Plan for imaging after one or two more cycles  Thyroid dysfunction Elevated TSH at 12. Medication dosage adjusted by nurse practitioner. Request to check thyroid levels during next lab visit to avoid additional trips. - Check TSH levels during next lab visit   She was advised to call immediately if she has any other concerning  symptoms in the interval. The patient voices understanding of current disease status and treatment options and is in agreement with the current care plan.  All questions were answered. The patient knows to call the clinic with any problems, questions or concerns. We can certainly see the patient much sooner if necessary.  The total time spent in the appointment was 20 minutes.  Disclaimer: This note was dictated with voice recognition software. Similar sounding words can inadvertently be transcribed and may not be corrected upon review.

## 2023-09-17 NOTE — Patient Instructions (Signed)
 [facility] - A DEPT OF [facility]  Discharge Instructions: Thank you for choosing [facility] to provide your oncology and hematology care.   If you have a lab appointment with the Cancer Center, please go directly to the Cancer Center and check in at the registration area.   Wear comfortable clothing and clothing appropriate for easy access to any Portacath or PICC line.   We strive to give you quality time with your provider. You may need to reschedule your appointment if you arrive late (15 or more minutes).  Arriving late affects you and other patients whose appointments are after yours.  Also, if you miss three or more appointments without notifying the office, you may be dismissed from the clinic at the provider's discretion.      For prescription refill requests, have your pharmacy contact our office and allow 72 hours for refills to be completed.    Today you received the following chemotherapy and/or immunotherapy agents Alimta      To help prevent nausea and vomiting after your treatment, we encourage you to take your nausea medication as directed.  BELOW ARE SYMPTOMS THAT SHOULD BE REPORTED IMMEDIATELY: *FEVER GREATER THAN 100.4 F (38 C) OR HIGHER *CHILLS OR SWEATING *NAUSEA AND VOMITING THAT IS NOT CONTROLLED WITH YOUR NAUSEA MEDICATION *UNUSUAL SHORTNESS OF BREATH *UNUSUAL BRUISING OR BLEEDING *URINARY PROBLEMS (pain or burning when urinating, or frequent urination) *BOWEL PROBLEMS (unusual diarrhea, constipation, pain near the anus) TENDERNESS IN MOUTH AND THROAT WITH OR WITHOUT PRESENCE OF ULCERS (sore throat, sores in mouth, or a toothache) UNUSUAL RASH, SWELLING OR PAIN  UNUSUAL VAGINAL DISCHARGE OR ITCHING   Items with * indicate a potential emergency and should be followed up as soon as possible or go to the Emergency Department if any problems should occur.  Please show the CHEMOTHERAPY ALERT CARD or IMMUNOTHERAPY  ALERT CARD at check-in to the Emergency Department and triage nurse.  Should you have questions after your visit or need to cancel or reschedule your appointment, please contact [facility] - A DEPT OF [facility]  Dept: [phone]  and follow the prompts.  Office hours are 8:00 a.m. to 4:30 p.m. Monday - Friday. Please note that voicemails left after 4:00 p.m. may not be returned until the following business day.  We are closed weekends and major holidays. You have access to a nurse at all times for urgent questions. Please call the main number to the clinic Dept: [phone] and follow the prompts.   For any non-urgent questions, you may also contact your provider using MyChart. We now offer e-Visits for anyone 79 and older to request care online for non-urgent symptoms. For details visit [email or web address].   Also download the MyChart app! Go to the app store, search "MyChart", open the app, select [name], and log in with your MyChart username and password.

## 2023-09-18 ENCOUNTER — Ambulatory Visit

## 2023-09-18 DIAGNOSIS — M6281 Muscle weakness (generalized): Secondary | ICD-10-CM | POA: Diagnosis not present

## 2023-09-18 DIAGNOSIS — R29898 Other symptoms and signs involving the musculoskeletal system: Secondary | ICD-10-CM

## 2023-09-18 DIAGNOSIS — R2689 Other abnormalities of gait and mobility: Secondary | ICD-10-CM

## 2023-09-18 DIAGNOSIS — R262 Difficulty in walking, not elsewhere classified: Secondary | ICD-10-CM

## 2023-09-18 DIAGNOSIS — R2681 Unsteadiness on feet: Secondary | ICD-10-CM

## 2023-09-18 NOTE — Therapy (Signed)
 OUTPATIENT PHYSICAL THERAPY NEURO TREATMENT   Patient Name: Hayley Wu MRN: [id number] DOB:08-30-1950, 73 y.o., female Today's Date: 09/18/2023   PCP: [name] REFERRING PROVIDER: Scarlett Presto [name], FNP   END OF SESSION:  PT End of Session - 09/18/23 1404     Visit Number 9    Number of Visits 17    Date for PT Re-Evaluation 10/10/23    Authorization Type [employer]    Progress Note Due on Visit 10    PT Start Time 1403    PT Stop Time 1445    PT Time Calculation (min) 42 min    Equipment Utilized During Treatment Gait belt    Activity Tolerance Patient tolerated treatment well    Behavior During Therapy WFL for tasks assessed/performed                 Past Medical History:  Diagnosis Date   Allergy    Contact lens/glasses fitting    Hypertension    Past Surgical History:  Procedure Laterality Date   ABDOMINAL HYSTERECTOMY     BREAST SURGERY  2010   rt lumpectomy-neg   IR IMAGING GUIDED PORT INSERTION  12/18/2022   LAMINECTOMY N/A 09/21/2022   Procedure: THORACIC LAMINECTOMY FOR TUMOR;  Surgeon: Tia Alert, MD;  Location: [facility] OR;  Service: Neurosurgery;  Laterality: N/A;   OPEN REDUCTION INTERNAL FIXATION (ORIF) DISTAL RADIAL FRACTURE Left 11/26/2013   Procedure: OPEN REDUCTION INTERNAL FIXATION (ORIF) LEFT  DISTAL RADIUS ;  Surgeon: Nestor Lewandowsky, MD;  Location:  S[facility]ERY [facility];  Service: Orthopedics;  Laterality: Left;  with block   TUBAL LIGATION     Patient Active Problem List   Diagnosis Date Noted   Acute deep vein thrombosis (DVT) of femoral vein of both lower extremities (HCC) 03/19/2023   Normocytic anemia 03/01/2023   Moderate protein malnutrition (HCC) 03/01/2023   Acute respiratory failure with hypoxia (HCC) 03/01/2023   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   Cardiomegaly 03/01/2023   Pleural effusion due to CHF (congestive heart failure) (HCC) 03/01/2023   Port-A-Cath in place  12/30/2022   Encounter for antineoplastic immunotherapy 11/18/2022   Encounter for antineoplastic chemotherapy 11/18/2022   Constipation 11/05/2022   Goals of care, counseling/discussion 10/21/2022   Cancer, metastatic to bone (HCC) 10/04/2022   Adenocarcinoma of right lung, stage 4 (HCC) 10/01/2022   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 09/20/2022   Hypokalemia 09/20/2022   Lung mass 09/20/2022   Hypothyroidism 09/20/2022   Cholelithiasis 09/20/2022   Lumbar spondylosis 08/26/2022   Thoracic back pain 04/04/2022   Low back pain 04/04/2022   Hypertension 09/23/2011    ONSET DATE: 08/08/2023 (MD order)  REFERRING DIAG:  Z91.81 (ICD-10-CM) - History of falling  C34.91 (ICD-10-CM) - Malignant neoplasm of unspecified part of right bronchus or lung    THERAPY DIAG:  Muscle weakness (generalized)  Other abnormalities of gait and mobility  Unsteadiness on feet  Other symptoms and signs involving the musculoskeletal system  Difficulty in walking, not elsewhere classified  Rationale for Evaluation and Treatment: Rehabilitation  SUBJECTIVE:  SUBJECTIVE STATEMENT: Was ill last week, had chemo yesterday. Just fatigued at present.   Pt accompanied by: self  PERTINENT HISTORY: Stage IV (T1b, N2, M1 C) non-small cell lung cancer, adenocarcinoma presented with right lower lobe lung nodule in addition to small bilateral pulmonary nodules and right hilar and mediastinal lymphadenopathy in addition to liver and bone metastasis diagnosed in April 2024. She is status post decompressive thoracic laminectomy of the T7 with spinal cord decompression and biopsy of the  epidural mass in April 2024.   PAIN:  Are you having pain? No  PRECAUTIONS: Fall and Other: currently in treatment for lung cancer  RED FLAGS: None   WEIGHT BEARING RESTRICTIONS: No  FALLS: Has patient fallen in last 6 months? No  LIVING ENVIRONMENT: Lives with: lives alone Lives in: House/apartment Stairs: 1 step to get into home Has following equipment at home: Single point cane, Environmental consultant - 4 wheeled, Wheelchair (manual), and shower chair  PLOF: Independent with household mobility with device and Independent with community mobility with device  PATIENT GOALS: Still need to work on balance, endurance, and would like to work on cane more.  OBJECTIVE:   TODAY'S TREATMENT: 09/18/23 Activity Comments  LAQ 3x12 4#  Hip add iso 3x12   Hip abd 3x12 green loop Vitals: 97%, 94 bpm  Multisensory balance  -feet apart/together EO/EC + head turns  Standing reach and place cones On firm/foam  Catch and toss bean bags   Tandem stance x 30 sec   Vitals: 103 bpm, 96%      TODAY'S TREATMENT: 09/09/23 Activity Comments  Seated LE PRE 3x10, 5# -hip add iso -LAQ -hip abd green loop -march  Hamstring curls 3x10 15# cable  Gait training -6" curb w/ cane and HR and CGA-SBA for 3 reps, without HR x 3 reps -8" curb x 3 reps w/ cane and HR x 2 reps, no HR x 1 rep, CGA -step ups 8": 2x10 reps cane+HR ---HR to 131 bpm, 90% O2 after  Seated rest x 2 min 97%, 113 bpm  Gait training -stepping over 6" hurdles, 180 deg pivot turn on airex pad  -lateral step over 6" hurdles             PATIENT EDUCATION: Education details: discussed pt's remaining functional limitations- she reports concerns about car transfers and floor transfers, concerns about getting groceries into the house, balance in the shower if not using transfer chair ; HEP update Person educated: Patient Education method: Explanation, Demonstration, Tactile cues, Verbal cues, and Handouts Education comprehension: verbalized  understanding and returned demonstration    HOME EXERCISE PROGRAM: Access Code: [email or web address] URL: [email or web address] Date: 08/29/2023 Prepared by: [facility] Out[facility]  Re[facility] - [facility]  Program Notes practice walking inside the house with your cane for 5 minutes per day.   Exercises - Seated Hamstring Curls with Resistance  - 1 x daily - 5 x weekly - 3 sets - 10 reps - Seated Knee Extension with Resistance  - 1 x daily - 5 x weekly - 3 sets - 10 reps - Sit to Stand with Counter Support  - 1 x daily - 5 x weekly - 2 sets - 10 reps - Seated Hip Abduction with Resistance  - 1 x daily - 5 x weekly - 2 sets - 10 reps - Side Stepping with Counter Support  - 1 x daily - 5 x weekly - 2-3 sets - 10 reps - Alternating Step Taps with Counter Support  - 1  x daily - 5 x weekly - 3 sets - 10 reps - Backward Walking with Counter Support  - 1 x daily - 5 x weekly - 1 sets - 5 reps     Note: Objective measures were completed at Evaluation unless otherwise noted.  DIAGNOSTIC FINDINGS: NA for this episode  COGNITION: Overall cognitive status: Within functional limits for tasks assessed   SENSATION: Light touch: WFL Sometimes reports pressure/heaviness in feet  MUSCLE TONE: WFL BLEs   POSTURE: rounded shoulders and forward head  LOWER EXTREMITY ROM:   ACTIVE ROM WFL  LOWER EXTREMITY MMT:    MMT Right Eval Left Eval  Hip flexion 3+ 3+  Hip extension    Hip abduction 4 4  Hip adduction 4 4  Hip internal rotation    Hip external rotation    Knee flexion 3+ 4  Knee extension 4 4  Ankle dorsiflexion 3+ 3+  Ankle plantarflexion    Ankle inversion    Ankle eversion    (Blank rows = not tested)    TRANSFERS: Assistive device utilized: Environmental consultant - 4 wheeled  Sit to stand: Modified independence Stand to sit: Modified independence   GAIT: Gait pattern: step through pattern, decreased step length- Right, decreased stride length, and wide BOS Distance  walked: 50 ft x 2 Assistive device utilized: Single point cane and Walker - 4 wheeled Level of assistance: SBA and CGA Comments: Pt brought in her cane (round tip base with SPC)  FUNCTIONAL TESTS:  5 times sit to stand: 13.63 sec arms crossed at chest Timed up and go (TUG): 17.37 with rollator; 20.91 sec with cane  10 meter walk test: 18.97 sec with rollator (1.73 ft/sec); 27.19 sec with cane (1.21 f/tsec) Berg Balance Scale: 32/56                                                                                                                                 TREATMENT DATE: 08/14/2023      GOALS: Goals reviewed with patient? Yes  SHORT TERM GOALS: Target date: 09/12/2023  Pt will be independent with HEP for improved strength, balance, gait. Baseline: Goal status: IN PROGRESS  2.  Pt will improve 5x sit<>stand to less than or equal to 12 sec to demonstrate improved functional strength and transfer efficiency. Baseline:  13.63 sec Goal status: IN PROGRESS  3.  Pt will improve Berg score to at least 38/56 to decrease fall risk. Baseline: 32/56 Goal status: IN PROGRESS  4.  Pt will improve gait velocity to at least 2 ft/sec with rollator for improved gait efficiency and safety. Baseline: 1.72 ft/sec Goal status: IN PROGRESS  LONG TERM GOALS: Target date: 10/10/2023  Pt will be independent with progression of HEP for improved strength, balance, gait. Baseline:  Goal status: IN PROGRESS  2.  Pt will improve TUG score to less than or equal to 13.5 sec for decreased fall risk. Baseline: 17.37 sec with  rollator; >20 sec cane Goal status: IN PROGRESS  3.  Pt will improve Berg score to at least 45/56 to decrease fall risk. Baseline: 32/56 Goal status: IN PROGRESS  4.  Pt will improve gait velocity to at least 1.8 ft/sec with cane for improved gait efficiency and safety.  Baseline:  Goal status: IN PROGRESS  5.  Pt will verbalize continued community fitness upon d/c from  PT. Baseline:  Goal status: IN PROGRESS  ASSESSMENT:  CLINICAL IMPRESSION: Iniitated with strength training to improve endurance w/ higher repetitions for sets and tolerating well. Multinsensory balance activities to facilitate righting reactions and postural stability. Repeated reaching and turning beyond midline to improve safety with ADL/housekeeping. Reactive balance activities for improved response to postural perturbations.  Improved endurance appreciated only requiring one 2 min rest period for entirety of session and vitals WNL although with resting tachycardia noted (~100 bpm). Continued sessions to progress gait training to increase independence with cane  OBJECTIVE IMPAIRMENTS: Abnormal gait, decreased balance, decreased mobility, difficulty walking, and decreased strength.   ACTIVITY LIMITATIONS: bending, standing, squatting, transfers, bed mobility, and locomotion level  PARTICIPATION LIMITATIONS: driving, shopping, and community activity  PERSONAL FACTORS: 3+ comorbidities: see above  are also affecting patient's functional outcome.   REHAB POTENTIAL: Good  CLINICAL DECISION MAKING: Evolving/moderate complexity  EVALUATION COMPLEXITY: Moderate  PLAN:  PT FREQUENCY: 2x/week  PT DURATION: 8 weeks plus eval visit  PLANNED INTERVENTIONS: 97110-Therapeutic exercises, 97530- Therapeutic activity, [id number]- Neuromuscular re-education, 97535- Self Care, [id number]- Manual therapy, [phone]- Gait training, Patient/Family education, and Balance training  PLAN FOR NEXT SESSION: has shoulder discomfort with overhead reaching + full can   2:05 PM, 09/18/23 M. Shary Decamp, PT, DPT Physical Therapist- [place] Office Number: [phone]

## 2023-09-19 ENCOUNTER — Other Ambulatory Visit: Payer: Self-pay

## 2023-09-19 NOTE — Therapy (Signed)
 OUTPATIENT PHYSICAL THERAPY NEURO PROGRESS NOTE   Patient Name: Hayley Wu MRN: [id number] DOB:[birth year], 73 y.o., female Today's Date: 09/22/2023   PCP: Therman Flatter REFERRING PROVIDER: Lorenza Romans, FNP    Progress Note Reporting Period 08/14/23 to 09/22/23  See note below for Objective Data and Assessment of Progress/Goals.      END OF SESSION:  PT End of Session - 09/22/23 1659     Visit Number 10    Number of Visits 17    Date for PT Re-Evaluation 10/10/23    Authorization Type [employer]    Progress Note Due on Visit 10    PT Start Time 1620    PT Stop Time 1658    PT Time Calculation (min) 38 min    Equipment Utilized During Treatment Gait belt    Activity Tolerance Patient tolerated treatment well    Behavior During Therapy WFL for tasks assessed/performed                  Past Medical History:  Diagnosis Date   Allergy    Contact lens/glasses fitting    Hypertension    Past Surgical History:  Procedure Laterality Date   ABDOMINAL HYSTERECTOMY     BREAST SURGERY  2010   rt lumpectomy-neg   IR IMAGING GUIDED PORT INSERTION  12/18/2022   LAMINECTOMY N/A 09/21/2022   Procedure: THORACIC LAMINECTOMY FOR TUMOR;  Surgeon: Isadora Mar, MD;  Location: [facility] OR;  Service: Neurosurgery;  Laterality: N/A;   OPEN REDUCTION INTERNAL FIXATION (ORIF) DISTAL RADIAL FRACTURE Left 11/26/2013   Procedure: OPEN REDUCTION INTERNAL FIXATION (ORIF) LEFT  DISTAL RADIUS ;  Surgeon: Ilean Mall, MD;  Location: [facility];  Service: Orthopedics;  Laterality: Left;  with block   TUBAL LIGATION     Patient Active Problem List   Diagnosis Date Noted   Acute deep vein thrombosis (DVT) of femoral vein of both lower extremities (HCC) 03/19/2023   Normocytic anemia 03/01/2023   Moderate protein malnutrition (HCC) 03/01/2023   Acute respiratory failure with hypoxia (HCC) 03/01/2023   Occlusion of left pulmonary artery (HCC)  03/01/2023   Cardiomegaly 03/01/2023   Pleural effusion due to CHF (congestive heart failure) (HCC) 03/01/2023   Port-A-Cath in place 12/30/2022   Encounter for antineoplastic immunotherapy 11/18/2022   Encounter for antineoplastic chemotherapy 11/18/2022   Constipation 11/05/2022   Goals of care, counseling/discussion 10/21/2022   Cancer, metastatic to bone (HCC) 10/04/2022   Adenocarcinoma of right lung, stage 4 (HCC) 10/01/2022   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 09/20/2022   Hypokalemia 09/20/2022   Lung mass 09/20/2022   Hypothyroidism 09/20/2022   Cholelithiasis 09/20/2022   Lumbar spondylosis 08/26/2022   Thoracic back pain 04/04/2022   Low back pain 04/04/2022   Hypertension 09/23/2011    ONSET DATE: 08/08/2023 (MD order)  REFERRING DIAG:  Z91.81 (ICD-10-CM) - History of falling  C34.91 (ICD-10-CM) - Malignant neoplasm of unspecified part of right bronchus or lung    THERAPY DIAG:  Muscle weakness (generalized)  Other abnormalities of gait and mobility  Unsteadiness on feet  Other symptoms and signs involving the musculoskeletal system  Difficulty in walking, not elsewhere classified  Rationale for Evaluation and Treatment: Rehabilitation  SUBJECTIVE:  SUBJECTIVE STATEMENT: Nothing new.    Pt accompanied by: self  PERTINENT HISTORY: Stage IV (T1b, N2, M1 C) non-small cell lung cancer, adenocarcinoma presented with right lower lobe lung nodule in addition to small bilateral pulmonary nodules and right hilar and mediastinal lymphadenopathy in addition to liver and bone metastasis diagnosed in April 2024. She is status post decompressive  thoracic laminectomy of the T7 with spinal cord decompression and biopsy of the epidural mass in April 2024.   PAIN:  Are you having pain? Yes (pt reports baseline pain in her spine, "nothing sharp")  PRECAUTIONS: Fall and Other: currently in treatment for lung cancer  RED FLAGS: None   WEIGHT BEARING RESTRICTIONS: No  FALLS: Has patient fallen in last 6 months? No  LIVING ENVIRONMENT: Lives with: lives alone Lives in: House/apartment Stairs: 1 step to get into home Has following equipment at home: Single point cane, Environmental consultant - 4 wheeled, Wheelchair (manual), and shower chair  PLOF: Independent with household mobility with device and Independent with community mobility with device  PATIENT GOALS: Still need to work on balance, endurance, and would like to work on cane more.  OBJECTIVE:     TODAY'S TREATMENT: 09/22/23 Activity Comments  5xSTS 14.49 sec   Berg 49/56  70M walk 20.71 sec with cane  1.58 ft/sec  70M walk 15.57 sec with 4WW 2.1 ft/sec   TUG 19.09 sec with cane; slightly unsteady with turn           [facility] PT Assessment - 09/22/23 0001       Berg Balance Test   Sit to Stand Able to stand without using hands and stabilize independently    Standing Unsupported Able to stand safely 2 minutes    Sitting with Back Unsupported but Feet Supported on Floor or Stool Able to sit safely and securely 2 minutes    Stand to Sit Sits safely with minimal use of hands    Transfers Able to transfer safely, minor use of hands    Standing Unsupported with Eyes Closed Able to stand 10 seconds with supervision    Standing Unsupported with Feet Together Able to place feet together independently and stand 1 minute safely    From Standing, Reach Forward with Outstretched Arm Can reach confidently >25 cm (10")    From Standing Position, Pick up Object from Floor Able to pick up shoe, needs supervision    From Standing Position, Turn to Look Behind Over each Shoulder Looks behind from  both sides and weight shifts well    Turn 360 Degrees Able to turn 360 degrees safely but slowly    Standing Unsupported, Alternately Place Feet on Step/Stool Able to stand independently and safely and complete 8 steps in 20 seconds    Standing Unsupported, One Foot in Front Able to place foot tandem independently and hold 30 seconds    Standing on One Leg Tries to lift leg/unable to hold 3 seconds but remains standing independently    Total Score 49             PATIENT EDUCATION: Education details: edu on progress towards goals and remaining impairments- pt reports remaining hesitancy with balance with EC and walking with cane; discussed examples of fitness activities post-DC;  Person educated: Patient Education method: Explanation, Demonstration, Tactile cues, Verbal cues, and Handouts Education comprehension: verbalized understanding and returned demonstration    HOME EXERCISE PROGRAM: Access Code: [email or web address] URL: [email or web address] Date: 08/29/2023 Prepared by: [facility] - [facility]  Program Notes practice walking inside the house with your cane for 5 minutes per day.   Exercises - Seated Hamstring Curls with Resistance  - 1 x daily - 5 x weekly - 3 sets - 10 reps - Seated Knee Extension with Resistance  - 1 x daily - 5 x weekly - 3 sets - 10 reps - Sit to Stand with Counter Support  - 1 x daily - 5 x weekly - 2 sets - 10 reps - Seated Hip Abduction with Resistance  - 1 x daily - 5 x weekly - 2 sets - 10 reps - Side Stepping with Counter Support  - 1 x daily - 5 x weekly - 2-3 sets - 10 reps - Alternating Step Taps with Counter Support  - 1 x daily - 5 x weekly - 3 sets - 10 reps - Backward Walking with Counter Support  - 1 x daily - 5 x weekly - 1 sets - 5 reps     Note: Objective measures were completed at Evaluation unless otherwise noted.  DIAGNOSTIC FINDINGS: NA for this episode  COGNITION: Overall cognitive status:  Within functional limits for tasks assessed   SENSATION: Light touch: WFL Sometimes reports pressure/heaviness in feet  MUSCLE TONE: WFL BLEs   POSTURE: rounded shoulders and forward head  LOWER EXTREMITY ROM:   ACTIVE ROM WFL  LOWER EXTREMITY MMT:    MMT Right Eval Left Eval  Hip flexion 3+ 3+  Hip extension    Hip abduction 4 4  Hip adduction 4 4  Hip internal rotation    Hip external rotation    Knee flexion 3+ 4  Knee extension 4 4  Ankle dorsiflexion 3+ 3+  Ankle plantarflexion    Ankle inversion    Ankle eversion    (Blank rows = not tested)    TRANSFERS: Assistive device utilized: Environmental consultant - 4 wheeled  Sit to stand: Modified independence Stand to sit: Modified independence   GAIT: Gait pattern: step through pattern, decreased step length- Right, decreased stride length, and wide BOS Distance walked: 50 ft x 2 Assistive device utilized: Single point cane and Walker - 4 wheeled Level of assistance: SBA and CGA Comments: Pt brought in her cane (round tip base with SPC)  FUNCTIONAL TESTS:  5 times sit to stand: 13.63 sec arms crossed at chest Timed up and go (TUG): 17.37 with rollator; 20.91 sec with cane  10 meter walk test: 18.97 sec with rollator (1.73 ft/sec); 27.19 sec with cane (1.21 f/tsec) Berg Balance Scale: 32/56  [facility] PT Assessment - 09/22/23 0001       Berg Balance Test   Sit to Stand Able to stand without using hands and stabilize independently    Standing Unsupported Able to stand safely 2 minutes    Sitting with Back Unsupported but Feet Supported on Floor or Stool Able to sit safely and securely 2 minutes    Stand to Sit Sits safely with minimal use of hands    Transfers Able to transfer safely, minor use of hands    Standing Unsupported with Eyes Closed Able to stand 10 seconds with supervision    Standing Unsupported with Feet Together Able to place feet together independently and stand 1 minute safely    From Standing, Reach Forward  with Outstretched Arm Can reach confidently >25 cm (10")    From Standing Position, Pick up Object from Floor Able to pick up shoe, needs supervision    From Standing Position,  Turn to Look Behind Over each Shoulder Looks behind from both sides and weight shifts well    Turn 360 Degrees Able to turn 360 degrees safely but slowly    Standing Unsupported, Alternately Place Feet on Step/Stool Able to stand independently and safely and complete 8 steps in 20 seconds    Standing Unsupported, One Foot in Front Able to place foot tandem independently and hold 30 seconds    Standing on One Leg Tries to lift leg/unable to hold 3 seconds but remains standing independently    Total Score 49                                                                                                                                           TREATMENT DATE: 08/14/2023      GOALS: Goals reviewed with patient? Yes  SHORT TERM GOALS: Target date: 09/12/2023  Pt will be independent with HEP for improved strength, balance, gait. Baseline: reports "some days better than others" Goal status: MET 09/22/23  2.  Pt will improve 5x sit<>stand to less than or equal to 12 sec to demonstrate improved functional strength and transfer efficiency. Baseline:  13.63 sec; 14.49 sec without UE support 09/22/23 Goal status: NOT MET 09/22/23  3.  Pt will improve Berg score to at least 38/56 to decrease fall risk. Baseline: 32/56; 49/56 09/22/23 Goal status: MET 09/22/23  4.  Pt will improve gait velocity to at least 2 ft/sec with rollator for improved gait efficiency and safety. Baseline: 1.72 ft/sec; 2.1 ft/sec 09/22/23 Goal status: MET 09/22/23  LONG TERM GOALS: Target date: 10/10/2023  Pt will be independent with progression of HEP for improved strength, balance, gait. Baseline:  Goal status: IN PROGRESS  09/22/23  2.  Pt will improve TUG score to less than or equal to 13.5 sec for decreased fall risk. Baseline: 17.37 sec  with rollator; >20 sec cane; 19.09 sec with cane 09/22/23 Goal status: IN PROGRESS 09/22/23  3.  Pt will improve Berg score to at least 45/56 to decrease fall risk. Baseline: 32/56; 49/56 09/22/23 Goal status: MET 09/22/23  4.  Pt will improve gait velocity to at least 1.8 ft/sec with cane for improved gait efficiency and safety.  Baseline: 1.58 ft/sec with cane 09/22/23 Goal status: IN PROGRESS 09/22/23  5.  Pt will verbalize continued community fitness upon d/c from PT. Baseline: pt not performing but was educated on this 09/22/23 Goal status: IN PROGRESS 09/22/23  ASSESSMENT:  CLINICAL IMPRESSION: Patient arrived to session without new complaints. Patient scored 49/56 on Berg, indicating a decreased risk of falls and improved from initial assessment. This goal has been met. Gait speed is now 1.58 ft/sec with SPC but is performing at 2.1 ft/sec with 4WW. Patient's transfer and gait speed are still somewhat limited when performing without UE support/with SPC, respectively. Educated patient on options for continuing with fitness regimen  of some sort after discharge. Patient is progressing well towards goals. Reports some remaining hesitation with EC balance and gait with cane. Plan to work on these in coming visits.   OBJECTIVE IMPAIRMENTS: Abnormal gait, decreased balance, decreased mobility, difficulty walking, and decreased strength.   ACTIVITY LIMITATIONS: bending, standing, squatting, transfers, bed mobility, and locomotion level  PARTICIPATION LIMITATIONS: driving, shopping, and community activity  PERSONAL FACTORS: 3+ comorbidities: see above  are also affecting patient's functional outcome.   REHAB POTENTIAL: Good  CLINICAL DECISION MAKING: Evolving/moderate complexity  EVALUATION COMPLEXITY: Moderate  PLAN:  PT FREQUENCY: 2x/week  PT DURATION: 8 weeks plus eval visit  PLANNED INTERVENTIONS: 97110-Therapeutic exercises, 97530- Therapeutic activity, 97112- Neuromuscular  re-education, 97535- Self Care, [id number]- Manual therapy, [phone]- Gait training, Patient/Family education, and Balance training  PLAN FOR NEXT SESSION: continue working on gait with cane and EC balance; has shoulder discomfort with overhead reaching + full can   Thaddeus Filippo, PT, DPT 09/22/23 5:03 PM  [facility] [address] [place], [place] [address] Phone # [phone] Fax # [phone]

## 2023-09-22 ENCOUNTER — Ambulatory Visit: Admitting: Physical Therapy

## 2023-09-22 ENCOUNTER — Encounter: Payer: Self-pay | Admitting: Physical Therapy

## 2023-09-22 DIAGNOSIS — R262 Difficulty in walking, not elsewhere classified: Secondary | ICD-10-CM

## 2023-09-22 DIAGNOSIS — R2681 Unsteadiness on feet: Secondary | ICD-10-CM

## 2023-09-22 DIAGNOSIS — M6281 Muscle weakness (generalized): Secondary | ICD-10-CM | POA: Diagnosis not present

## 2023-09-22 DIAGNOSIS — R29898 Other symptoms and signs involving the musculoskeletal system: Secondary | ICD-10-CM

## 2023-09-22 DIAGNOSIS — R2689 Other abnormalities of gait and mobility: Secondary | ICD-10-CM

## 2023-09-30 ENCOUNTER — Ambulatory Visit: Admitting: Physical Therapy

## 2023-09-30 ENCOUNTER — Encounter: Payer: Self-pay | Admitting: Physical Therapy

## 2023-09-30 DIAGNOSIS — R2689 Other abnormalities of gait and mobility: Secondary | ICD-10-CM

## 2023-09-30 DIAGNOSIS — M6281 Muscle weakness (generalized): Secondary | ICD-10-CM | POA: Diagnosis not present

## 2023-09-30 DIAGNOSIS — R2681 Unsteadiness on feet: Secondary | ICD-10-CM

## 2023-09-30 NOTE — Therapy (Signed)
 OUTPATIENT PHYSICAL THERAPY NEURO TREATMENT NOTE   Patient Name: Hayley Wu MRN: [id number] DOB:1950/12/04, 73 y.o., female Today's Date: 09/30/2023   PCP: Therman Flatter REFERRING PROVIDER: Ayesha Lente Lowell Rude, FNP       END OF SESSION:  PT End of Session - 09/30/23 1022     Visit Number 11    Number of Visits 17    Date for PT Re-Evaluation 10/10/23    Authorization Type [employer]    PT Start Time 1021    PT Stop Time 1059    PT Time Calculation (min) 38 min    Equipment Utilized During Treatment Gait belt    Activity Tolerance Patient tolerated treatment well    Behavior During Therapy WFL for tasks assessed/performed                   Past Medical History:  Diagnosis Date   Allergy    Contact lens/glasses fitting    Hypertension    Past Surgical History:  Procedure Laterality Date   ABDOMINAL HYSTERECTOMY     BREAST SURGERY  2010   rt lumpectomy-neg   IR IMAGING GUIDED PORT INSERTION  12/18/2022   LAMINECTOMY N/A 09/21/2022   Procedure: THORACIC LAMINECTOMY FOR TUMOR;  Surgeon: Isadora Mar, MD;  Location: [facility] OR;  Service: Neurosurgery;  Laterality: N/A;   OPEN REDUCTION INTERNAL FIXATION (ORIF) DISTAL RADIAL FRACTURE Left 11/26/2013   Procedure: OPEN REDUCTION INTERNAL FIXATION (ORIF) LEFT  DISTAL RADIUS ;  Surgeon: Ilean Mall, MD;  Location:  [facility];  Service: Orthopedics;  Laterality: Left;  with block   TUBAL LIGATION     Patient Active Problem List   Diagnosis Date Noted   Acute deep vein thrombosis (DVT) of femoral vein of both lower extremities (HCC) 03/19/2023   Normocytic anemia 03/01/2023   Moderate protein malnutrition (HCC) 03/01/2023   Acute respiratory failure with hypoxia (HCC) 03/01/2023   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   Cardiomegaly 03/01/2023   Pleural effusion due to CHF (congestive heart failure) (HCC) 03/01/2023   Port-A-Cath in place 12/30/2022   Encounter  for antineoplastic immunotherapy 11/18/2022   Encounter for antineoplastic chemotherapy 11/18/2022   Constipation 11/05/2022   Goals of care, counseling/discussion 10/21/2022   Cancer, metastatic to bone (HCC) 10/04/2022   Adenocarcinoma of right lung, stage 4 (HCC) 10/01/2022   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 09/20/2022   Hypokalemia 09/20/2022   Lung mass 09/20/2022   Hypothyroidism 09/20/2022   Cholelithiasis 09/20/2022   Lumbar spondylosis 08/26/2022   Thoracic back pain 04/04/2022   Low back pain 04/04/2022   Hypertension 09/23/2011    ONSET DATE: 08/08/2023 (MD order)  REFERRING DIAG:  Z91.81 (ICD-10-CM) - History of falling  C34.91 (ICD-10-CM) - Malignant neoplasm of unspecified part of right bronchus or lung    THERAPY DIAG:  Muscle weakness (generalized)  Unsteadiness on feet  Other abnormalities of gait and mobility  Rationale for Evaluation and Treatment: Rehabilitation  SUBJECTIVE:  SUBJECTIVE STATEMENT: Still use the walker around the house, just because it's easier and faster.  Use the cane a little.   Pt accompanied by: self  PERTINENT HISTORY: Stage IV (T1b, N2, M1 C) non-small cell lung cancer, adenocarcinoma presented with right lower lobe lung nodule in addition to small bilateral pulmonary nodules and right hilar and mediastinal lymphadenopathy in addition to liver and bone metastasis diagnosed in April 2024. 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   PAIN:  Are you having pain? Yes (pt reports baseline pain in her spine,  "nothing sharp")  PRECAUTIONS: Fall and Other: currently in treatment for lung cancer  RED FLAGS: None   WEIGHT BEARING RESTRICTIONS: No  FALLS: Has patient fallen in last 6 months? No  LIVING ENVIRONMENT: Lives with: lives alone Lives in: House/apartment Stairs: 1 step to get into home Has following equipment at home: Single point cane, Environmental consultant - 4 wheeled, Wheelchair (manual), and shower chair  PLOF: Independent with household mobility with device and Independent with community mobility with device  PATIENT GOALS: Still need to work on balance, endurance, and would like to work on cane more.  OBJECTIVE:     TODAY'S TREATMENT: 09/29/2023 Activity Comments  Sit to stand, 2 x 5 reps from mat surfaces Sit to stand 5 reps EC Sit to stand, on [employer] x 5 Hands on knees  Gait with SPC and small quad tip base, 200 ft+ distances with head turns/environmental scanning Slowed pace  Gait with SPC with small quad tip base carrying items, then short distance gait/turns/carry cones Supervision, no LOB; 1 sitting rest break  Scapular squeezes 2 x 5 reps   Standing feet apart/feet together EO/EC 30 sec, x 2 reps Feet together EO head turns/nods x 5 Increased sway with head motions and feet together EC             PATIENT EDUCATION: Education details: Discussed practicing walking at home with cane several minutes, several times/day Person educated: Patient Education method: Explanation, Demonstration, Tactile cues, Verbal cues, and Handouts Education comprehension: verbalized understanding and returned demonstration    HOME EXERCISE PROGRAM: Access Code: [email or web address] URL: [email or web address] Date: 08/29/2023 Prepared by: [facility] - Outpatient  Rehab - [facility]  Program Notes practice walking inside the house with your cane for 5 minutes per day.   Exercises - Seated Hamstring Curls with Resistance  - 1 x daily - 5 x weekly - 3 sets - 10 reps - Seated  Knee Extension with Resistance  - 1 x daily - 5 x weekly - 3 sets - 10 reps - Sit to Stand with Counter Support  - 1 x daily - 5 x weekly - 2 sets - 10 reps - Seated Hip Abduction with Resistance  - 1 x daily - 5 x weekly - 2 sets - 10 reps - Side Stepping with Counter Support  - 1 x daily - 5 x weekly - 2-3 sets - 10 reps - Alternating Step Taps with Counter Support  - 1 x daily - 5 x weekly - 3 sets - 10 reps - Backward Walking with Counter Support  - 1 x daily - 5 x weekly - 1 sets - 5 reps     Note: Objective measures were completed at Evaluation unless otherwise noted.  DIAGNOSTIC FINDINGS: NA for this episode  COGNITION: Overall cognitive status: Within functional limits for tasks assessed  SENSATION: Light touch: WFL Sometimes reports pressure/heaviness in feet  MUSCLE TONE: WFL BLEs   POSTURE: rounded shoulders and forward head  LOWER EXTREMITY ROM:   ACTIVE ROM WFL  LOWER EXTREMITY MMT:    MMT Right Eval Left Eval  Hip flexion 3+ 3+  Hip extension    Hip abduction 4 4  Hip adduction 4 4  Hip internal rotation    Hip external rotation    Knee flexion 3+ 4  Knee extension 4 4  Ankle dorsiflexion 3+ 3+  Ankle plantarflexion    Ankle inversion    Ankle eversion    (Blank rows = not tested)    TRANSFERS: Assistive device utilized: Environmental consultant - 4 wheeled  Sit to stand: Modified independence Stand to sit: Modified independence   GAIT: Gait pattern: step through pattern, decreased step length- Right, decreased stride length, and wide BOS Distance walked: 50 ft x 2 Assistive device utilized: Single point cane and Walker - 4 wheeled Level of assistance: SBA and CGA Comments: Pt brought in her cane (round tip base with SPC)  FUNCTIONAL TESTS:  5 times sit to stand: 13.63 sec arms crossed at chest Timed up and go (TUG): 17.37 with rollator; 20.91 sec with cane  10 meter walk test: 18.97 sec with rollator (1.73 ft/sec); 27.19 sec with cane (1.21 f/tsec) Berg  Balance Scale: 32/56                                                                                                                                  TREATMENT DATE: 08/14/2023      GOALS: Goals reviewed with patient? Yes  SHORT TERM GOALS: Target date: 09/12/2023  Pt will be independent with HEP for improved strength, balance, gait. Baseline: reports "some days better than others" Goal status: MET 09/22/23  2.  Pt will improve 5x sit<>stand to less than or equal to 12 sec to demonstrate improved functional strength and transfer efficiency. Baseline:  13.63 sec; 14.49 sec without UE support 09/22/23 Goal status: NOT MET 09/22/23  3.  Pt will improve Berg score to at least 38/56 to decrease fall risk. Baseline: 32/56; 49/56 09/22/23 Goal status: MET 09/22/23  4.  Pt will improve gait velocity to at least 2 ft/sec with rollator for improved gait efficiency and safety. Baseline: 1.72 ft/sec; 2.1 ft/sec 09/22/23 Goal status: MET 09/22/23  LONG TERM GOALS: Target date: 10/10/2023  Pt will be independent with progression of HEP for improved strength, balance, gait. Baseline:  Goal status: IN PROGRESS  09/22/23  2.  Pt will improve TUG score to less than or equal to 13.5 sec for decreased fall risk. Baseline: 17.37 sec with rollator; >20 sec cane; 19.09 sec with cane 09/22/23 Goal status: IN PROGRESS 09/22/23  3.  Pt will improve Berg score to at least 45/56 to decrease fall risk. Baseline: 32/56; 49/56 09/22/23 Goal status: MET 09/22/23  4.  Pt will improve gait velocity  to at least 1.8 ft/sec with cane for improved gait efficiency and safety.  Baseline: 1.58 ft/sec with cane 09/22/23 Goal status: IN PROGRESS 09/22/23  5.  Pt will verbalize continued community fitness upon d/c from PT. Baseline: pt not performing but was educated on this 09/22/23 Goal status: IN PROGRESS 09/22/23  ASSESSMENT:  CLINICAL IMPRESSION: Pt presents today with no new complaints. Skilled PT session focused on  functional strength and postural strength. Also worked on Hospital doctor, with pt having more difficulty with feet together and with head motions or with EC.  Also addressed gait/carry items + turning, with cane, with pt needing supervision and having no LOB.  Educated pt to try to walk in hallway at home, several minutes, several times/day with her cane to build endurance and confidence with gait with cane.  Pt will continue to benefit from skilled PT towards goals for improved functional mobility and decreased fall risk.   OBJECTIVE IMPAIRMENTS: Abnormal gait, decreased balance, decreased mobility, difficulty walking, and decreased strength.   ACTIVITY LIMITATIONS: bending, standing, squatting, transfers, bed mobility, and locomotion level  PARTICIPATION LIMITATIONS: driving, shopping, and community activity  PERSONAL FACTORS: 3+ comorbidities: see above  are also affecting patient's functional outcome.   REHAB POTENTIAL: Good  CLINICAL DECISION MAKING: Evolving/moderate complexity  EVALUATION COMPLEXITY: Moderate  PLAN:  PT FREQUENCY: 2x/week  PT DURATION: 8 weeks plus eval visit  PLANNED INTERVENTIONS: 97110-Therapeutic exercises, 97530- Therapeutic activity, 97112- Neuromuscular re-education, 97535- Self Care, 54098- Manual therapy, [phone]- Gait training, Patient/Family education, and Balance training  PLAN FOR NEXT SESSION: Corner balance exercises (try to perform and given for HEP); continue working on gait with cane and EC balance; has shoulder discomfort with overhead reaching + full can   Dessie Flow, PT 09/30/23 12:01 PM Phone: [phone] Fax: [phone]  [facility] at [facility] [address], [address] [place], [place] [address] Phone # [phone] Fax # [phone]

## 2023-10-02 NOTE — Therapy (Signed)
 OUTPATIENT PHYSICAL THERAPY NEURO TREATMENT NOTE   Patient Name: Hayley Wu MRN: [id number] DOB:04-13-1951, 73 y.o., female Today's Date: 10/03/2023   PCP: Therman Flatter REFERRING PROVIDER: Ayesha Lente Lowell Rude, FNP       END OF SESSION:  PT End of Session - 10/03/23 1057     Visit Number 12    Number of Visits 17    Date for PT Re-Evaluation 10/10/23    Authorization Type [employer]    PT Start Time 1017    PT Stop Time 1058    PT Time Calculation (min) 41 min    Equipment Utilized During Treatment Gait belt    Activity Tolerance Patient tolerated treatment well    Behavior During Therapy WFL for tasks assessed/performed                    Past Medical History:  Diagnosis Date   Allergy    Contact lens/glasses fitting    Hypertension    Past Surgical History:  Procedure Laterality Date   ABDOMINAL HYSTERECTOMY     BREAST SURGERY  2010   rt lumpectomy-neg   IR IMAGING GUIDED PORT INSERTION  12/18/2022   LAMINECTOMY N/A 09/21/2022   Procedure: THORACIC LAMINECTOMY FOR TUMOR;  Surgeon: Isadora Mar, MD;  Location: [facility] OR;  Service: Neurosurgery;  Laterality: N/A;   OPEN REDUCTION INTERNAL FIXATION (ORIF) DISTAL RADIAL FRACTURE Left 11/26/2013   Procedure: OPEN REDUCTION INTERNAL FIXATION (ORIF) LEFT  DISTAL RADIUS ;  Surgeon: Ilean Mall, MD;  Location: [facility];  Service: Orthopedics;  Laterality: Left;  with block   TUBAL LIGATION     Patient Active Problem List   Diagnosis Date Noted   Acute deep vein thrombosis (DVT) of femoral vein of both lower extremities (HCC) 03/19/2023   Normocytic anemia 03/01/2023   Moderate protein malnutrition (HCC) 03/01/2023   Acute respiratory failure with hypoxia (HCC) 03/01/2023   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   Cardiomegaly 03/01/2023   Pleural effusion due to CHF (congestive heart failure) (HCC) 03/01/2023   Port-A-Cath in place 12/30/2022   Encounter  for antineoplastic immunotherapy 11/18/2022   Encounter for antineoplastic chemotherapy 11/18/2022   Constipation 11/05/2022   Goals of care, counseling/discussion 10/21/2022   Cancer, metastatic to bone (HCC) 10/04/2022   Adenocarcinoma of right lung, stage 4 (HCC) 10/01/2022   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 09/20/2022   Hypokalemia 09/20/2022   Lung mass 09/20/2022   Hypothyroidism 09/20/2022   Cholelithiasis 09/20/2022   Lumbar spondylosis 08/26/2022   Thoracic back pain 04/04/2022   Low back pain 04/04/2022   Hypertension 09/23/2011    ONSET DATE: 08/08/2023 (MD order)  REFERRING DIAG:  Z91.81 (ICD-10-CM) - History of falling  C34.91 (ICD-10-CM) - Malignant neoplasm of unspecified part of right bronchus or lung    THERAPY DIAG:  Muscle weakness (generalized)  Unsteadiness on feet  Other abnormalities of gait and mobility  Other symptoms and signs involving the musculoskeletal system  Difficulty in walking, not elsewhere classified  Rationale for Evaluation and Treatment: Rehabilitation  SUBJECTIVE:  SUBJECTIVE STATEMENT: I don't know what I did last time that made my hips hurt. Could have been d/t the weather.    Pt accompanied by: self  PERTINENT HISTORY: Stage IV (T1b, N2, M1 C) non-small cell lung cancer, adenocarcinoma presented with right lower lobe lung nodule in addition to small bilateral pulmonary nodules and right hilar and mediastinal lymphadenopathy in addition to liver and bone metastasis diagnosed in April 2024. She is status post decompressive thoracic laminectomy of the T7 with spinal cord decompression and biopsy of the  epidural mass in April 2024.   PAIN:  Are you having pain? Yes ("hips feel better than they were yesterday")  PRECAUTIONS: Fall and Other: currently in treatment for lung cancer  RED FLAGS: None   WEIGHT BEARING RESTRICTIONS: No  FALLS: Has patient fallen in last 6 months? No  LIVING ENVIRONMENT: Lives with: lives alone Lives in: House/apartment Stairs: 1 step to get into home Has following equipment at home: Single point cane, Environmental consultant - 4 wheeled, Wheelchair (manual), and shower chair  PLOF: Independent with household mobility with device and Independent with community mobility with device  PATIENT GOALS: Still need to work on balance, endurance, and would like to work on cane more.  OBJECTIVE:     TODAY'S TREATMENT: 10/03/23 Activity Comments  corner balance: romberg EO/EC 2x30" each  romberg EO/EC  head turns/nods 2x30" each  semi tandem 2x30" Walking in front. Required light UE support with EC     gait outside on curbs, ramps, sidewalk 549ft in 9 min Slow gait and mild unsteadiness but no LOB; provided SBA/CGA. Pt apprehensive with descending curbs but was able to perform this safely 3x. Pt reports fatigue        PATIENT EDUCATION: Education details: HEP update with edu for safety- advised to perform in corner and with walker in front; discussed pt's remaining apprehension with gait with cane at home, POC Person educated: Patient Education method: Explanation, Demonstration, Tactile cues, Verbal cues, and Handouts Education comprehension: verbalized understanding and returned demonstration     HOME EXERCISE PROGRAM: Access Code: [email or web address] URL: [email or web address] Date: 10/03/2023 Prepared by: [facility] - Outpatient  Rehab - [facility]  Program Notes practice walking inside the house with your cane for 5 minutes per day.   Exercises - Seated Hamstring Curls with Resistance  - 1 x daily - 5 x weekly - 3 sets - 10 reps - Seated Knee  Extension with Resistance  - 1 x daily - 5 x weekly - 3 sets - 10 reps - Seated Hip Abduction with Resistance  - 1 x daily - 5 x weekly - 2 sets - 10 reps - Side Stepping with Counter Support  - 1 x daily - 5 x weekly - 2-3 sets - 10 reps - Alternating Step Taps with Counter Support  - 1 x daily - 5 x weekly - 3 sets - 10 reps - Backward Walking with Counter Support  - 1 x daily - 5 x weekly - 1 sets - 5 reps - Squat with Chair Touch  - 1 x daily - 5 x weekly - 2 sets - 10 reps - Corner Balance Feet Together With Eyes Open  - 1 x daily - 5 x weekly - 2 sets - 30 sec hold - Corner Balance Feet Apart: Eyes Open With Head Turns  - 1 x daily - 5 x weekly - 2 sets - 30 sec hold - Romberg Stance with Head Nods  - 1 x  daily - 5 x weekly - 2 sets - 30 sec hold     Note: Objective measures were completed at Evaluation unless otherwise noted.  DIAGNOSTIC FINDINGS: NA for this episode  COGNITION: Overall cognitive status: Within functional limits for tasks assessed   SENSATION: Light touch: WFL Sometimes reports pressure/heaviness in feet  MUSCLE TONE: WFL BLEs   POSTURE: rounded shoulders and forward head  LOWER EXTREMITY ROM:   ACTIVE ROM WFL  LOWER EXTREMITY MMT:    MMT Right Eval Left Eval  Hip flexion 3+ 3+  Hip extension    Hip abduction 4 4  Hip adduction 4 4  Hip internal rotation    Hip external rotation    Knee flexion 3+ 4  Knee extension 4 4  Ankle dorsiflexion 3+ 3+  Ankle plantarflexion    Ankle inversion    Ankle eversion    (Blank rows = not tested)    TRANSFERS: Assistive device utilized: Environmental consultant - 4 wheeled  Sit to stand: Modified independence Stand to sit: Modified independence   GAIT: Gait pattern: step through pattern, decreased step length- Right, decreased stride length, and wide BOS Distance walked: 50 ft x 2 Assistive device utilized: Single point cane and Walker - 4 wheeled Level of assistance: SBA and CGA Comments: Pt brought in her cane  (round tip base with SPC)  FUNCTIONAL TESTS:  5 times sit to stand: 13.63 sec arms crossed at chest Timed up and go (TUG): 17.37 with rollator; 20.91 sec with cane  10 meter walk test: 18.97 sec with rollator (1.73 ft/sec); 27.19 sec with cane (1.21 f/tsec) Berg Balance Scale: 32/56                                                                                                                                  TREATMENT DATE: 08/14/2023      GOALS: Goals reviewed with patient? Yes  SHORT TERM GOALS: Target date: 09/12/2023  Pt will be independent with HEP for improved strength, balance, gait. Baseline: reports "some days better than others" Goal status: MET 09/22/23  2.  Pt will improve 5x sit<>stand to less than or equal to 12 sec to demonstrate improved functional strength and transfer efficiency. Baseline:  13.63 sec; 14.49 sec without UE support 09/22/23 Goal status: NOT MET 09/22/23  3.  Pt will improve Berg score to at least 38/56 to decrease fall risk. Baseline: 32/56; 49/56 09/22/23 Goal status: MET 09/22/23  4.  Pt will improve gait velocity to at least 2 ft/sec with rollator for improved gait efficiency and safety. Baseline: 1.72 ft/sec; 2.1 ft/sec 09/22/23 Goal status: MET 09/22/23  LONG TERM GOALS: Target date: 10/10/2023  Pt will be independent with progression of HEP for improved strength, balance, gait. Baseline:  Goal status: IN PROGRESS  09/22/23  2.  Pt will improve TUG score to less than or equal to 13.5 sec for decreased fall risk. Baseline: 17.37 sec with  rollator; >20 sec cane; 19.09 sec with cane 09/22/23 Goal status: IN PROGRESS 09/22/23  3.  Pt will improve Berg score to at least 45/56 to decrease fall risk. Baseline: 32/56; 49/56 09/22/23 Goal status: MET 09/22/23  4.  Pt will improve gait velocity to at least 1.8 ft/sec with cane for improved gait efficiency and safety.  Baseline: 1.58 ft/sec with cane 09/22/23 Goal status: IN PROGRESS 09/22/23  5.  Pt  will verbalize continued community fitness upon d/c from PT. Baseline: pt not performing but was educated on this 09/22/23 Goal status: IN PROGRESS 09/22/23  ASSESSMENT:  CLINICAL IMPRESSION: Patient arrived to session with report of some hip pain after last session; better today. Worked on corner balance activities for exposure of narrow BOS, EC, and head movements. Patient required light UE support with EC but was able to complete these safely with walker in front; updated these into HEP. Patient performed gait training with cane on outside surfaces. Patient demonstrates some remaining apprehension descending curbs despite demonstrating good safety/stability. Patient tolerated session well and without complaints upon leaving.   OBJECTIVE IMPAIRMENTS: Abnormal gait, decreased balance, decreased mobility, difficulty walking, and decreased strength.   ACTIVITY LIMITATIONS: bending, standing, squatting, transfers, bed mobility, and locomotion level  PARTICIPATION LIMITATIONS: driving, shopping, and community activity  PERSONAL FACTORS: 3+ comorbidities: see above  are also affecting patient's functional outcome.   REHAB POTENTIAL: Good  CLINICAL DECISION MAKING: Evolving/moderate complexity  EVALUATION COMPLEXITY: Moderate  PLAN:  PT FREQUENCY: 2x/week  PT DURATION: 8 weeks plus eval visit  PLANNED INTERVENTIONS: 97110-Therapeutic exercises, 97530- Therapeutic activity, 97112- Neuromuscular re-education, 97535- Self Care, 86578- Manual therapy, [phone]- Gait training, Patient/Family education, and Balance training  PLAN FOR NEXT SESSION: balance EC; continue working on gait with cane and EC balance; has shoulder discomfort with overhead reaching + full can   Thaddeus Filippo, PT, DPT 10/03/23 11:00 AM  [facility] at [facility] [address] [place], [place] [address] Phone # [phone] Fax # [phone]

## 2023-10-03 ENCOUNTER — Ambulatory Visit: Admitting: Physical Therapy

## 2023-10-03 ENCOUNTER — Encounter: Payer: Self-pay | Admitting: Physical Therapy

## 2023-10-03 DIAGNOSIS — R2689 Other abnormalities of gait and mobility: Secondary | ICD-10-CM

## 2023-10-03 DIAGNOSIS — R29898 Other symptoms and signs involving the musculoskeletal system: Secondary | ICD-10-CM

## 2023-10-03 DIAGNOSIS — M6281 Muscle weakness (generalized): Secondary | ICD-10-CM | POA: Diagnosis not present

## 2023-10-03 DIAGNOSIS — R2681 Unsteadiness on feet: Secondary | ICD-10-CM

## 2023-10-03 DIAGNOSIS — R262 Difficulty in walking, not elsewhere classified: Secondary | ICD-10-CM

## 2023-10-06 NOTE — Therapy (Signed)
 OUTPATIENT PHYSICAL THERAPY NEURO TREATMENT NOTE   Patient Name: Hayley Wu MRN: [id number] DOB:11-Nov-1950, 73 y.o., female Today's Date: 10/07/2023   PCP: Therman Flatter REFERRING PROVIDER: Ayesha Lente Lowell Rude, FNP       END OF SESSION:  PT End of Session - 10/07/23 1057     Visit Number 13    Number of Visits 17    Date for PT Re-Evaluation 10/10/23    Authorization Type [employer]    PT Start Time 1016    PT Stop Time 1056    PT Time Calculation (min) 40 min    Equipment Utilized During Treatment Gait belt    Activity Tolerance Patient tolerated treatment well    Behavior During Therapy WFL for tasks assessed/performed;Anxious                     Past Medical History:  Diagnosis Date   Allergy    Contact lens/glasses fitting    Hypertension    Past Surgical History:  Procedure Laterality Date   ABDOMINAL HYSTERECTOMY     BREAST SURGERY  2010   rt lumpectomy-neg   IR IMAGING GUIDED PORT INSERTION  12/18/2022   LAMINECTOMY N/A 09/21/2022   Procedure: THORACIC LAMINECTOMY FOR TUMOR;  Surgeon: Isadora Mar, MD;  Location: [facility] OR;  Service: Neurosurgery;  Laterality: N/A;   OPEN REDUCTION INTERNAL FIXATION (ORIF) DISTAL RADIAL FRACTURE Left 11/26/2013   Procedure: OPEN REDUCTION INTERNAL FIXATION (ORIF) LEFT  DISTAL RADIUS ;  Surgeon: Ilean Mall, MD;  Location: [facility];  Service: Orthopedics;  Laterality: Left;  with block   TUBAL LIGATION     Patient Active Problem List   Diagnosis Date Noted   Acute deep vein thrombosis (DVT) of femoral vein of both lower extremities (HCC) 03/19/2023   Normocytic anemia 03/01/2023   Moderate protein malnutrition (HCC) 03/01/2023   Acute respiratory failure with hypoxia (HCC) 03/01/2023   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   Cardiomegaly 03/01/2023   Pleural effusion due to CHF (congestive heart failure) (HCC) 03/01/2023   Port-A-Cath in place 12/30/2022    Encounter for antineoplastic immunotherapy 11/18/2022   Encounter for antineoplastic chemotherapy 11/18/2022   Constipation 11/05/2022   Goals of care, counseling/discussion 10/21/2022   Cancer, metastatic to bone (HCC) 10/04/2022   Adenocarcinoma of right lung, stage 4 (HCC) 10/01/2022   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 09/20/2022   Hypokalemia 09/20/2022   Lung mass 09/20/2022   Hypothyroidism 09/20/2022   Cholelithiasis 09/20/2022   Lumbar spondylosis 08/26/2022   Thoracic back pain 04/04/2022   Low back pain 04/04/2022   Hypertension 09/23/2011    ONSET DATE: 08/08/2023 (MD order)  REFERRING DIAG:  Z91.81 (ICD-10-CM) - History of falling  C34.91 (ICD-10-CM) - Malignant neoplasm of unspecified part of right bronchus or lung    THERAPY DIAG:  Muscle weakness (generalized)  Unsteadiness on feet  Other abnormalities of gait and mobility  Other symptoms and signs involving the musculoskeletal system  Rationale for Evaluation and Treatment: Rehabilitation  SUBJECTIVE:  SUBJECTIVE STATEMENT: Nothing new.    Pt accompanied by: self  PERTINENT HISTORY: Stage IV (T1b, N2, M1 C) non-small cell lung cancer, adenocarcinoma presented with right lower lobe lung nodule in addition to small bilateral pulmonary nodules and right hilar and mediastinal lymphadenopathy in addition to liver and bone metastasis diagnosed in April 2024. 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    PAIN:  Are you having pain? Yes: NPRS scale: 2/10 Pain location: low back Pain  description: sore Aggravating factors: bending, sitting wrong  Relieving factors: tylenol    PRECAUTIONS: Fall and Other: currently in treatment for lung cancer  RED FLAGS: None   WEIGHT BEARING RESTRICTIONS: No  FALLS: Has patient fallen in last 6 months? No  LIVING ENVIRONMENT: Lives with: lives alone Lives in: House/apartment Stairs: 1 step to get into home Has following equipment at home: Single point cane, Environmental consultant - 4 wheeled, Wheelchair (manual), and shower chair  PLOF: Independent with household mobility with device and Independent with community mobility with device  PATIENT GOALS: Still need to work on balance, endurance, and would like to work on cane more.  OBJECTIVE:     TODAY'S TREATMENT: 10/07/23 Activity Comments  gait training with cane: figure 8 turns head turns/nods (with and without cane) stepping over obstacles CGA , cueing for sequencing for sharp turns. Encouraged greater neck excursion with head movements. Cueing for can replacement when stepping over obstacles.  required occasional sit breaks d/t thigh fatigue   Straight line walking without AD 4x54ft, then 2x 45ft  CGA; pt very guarded. Requires cues for rhythmic breathing and relaxation, longer R step length   Gait training with cane Good stability   1/4 and 1/2 turns in II bars Cueing to pick up feet and avoid pivoting           PATIENT EDUCATION: Education details: encouraged practicing walking in the house with cane as pt is quite steady  Person educated: Patient Education method: Explanation, Demonstration, Tactile cues, and Verbal cues Education comprehension: verbalized understanding and returned demonstration     HOME EXERCISE PROGRAM: Access Code: [email or web address] URL: [email or web address] Date: 10/03/2023 Prepared by: [facility] - Outpatient  Rehab - [facility]  Program Notes practice walking inside the house with your cane for 5 minutes per day.   Exercises -  Seated Hamstring Curls with Resistance  - 1 x daily - 5 x weekly - 3 sets - 10 reps - Seated Knee Extension with Resistance  - 1 x daily - 5 x weekly - 3 sets - 10 reps - Seated Hip Abduction with Resistance  - 1 x daily - 5 x weekly - 2 sets - 10 reps - Side Stepping with Counter Support  - 1 x daily - 5 x weekly - 2-3 sets - 10 reps - Alternating Step Taps with Counter Support  - 1 x daily - 5 x weekly - 3 sets - 10 reps - Backward Walking with Counter Support  - 1 x daily - 5 x weekly - 1 sets - 5 reps - Squat with Chair Touch  - 1 x daily - 5 x weekly - 2 sets - 10 reps - Corner Balance Feet Together With Eyes Open  - 1 x daily - 5 x weekly - 2 sets - 30 sec hold - Corner Balance Feet Apart: Eyes Open With Head Turns  - 1 x daily - 5 x  weekly - 2 sets - 30 sec hold - Romberg Stance with Head Nods  - 1 x daily - 5 x weekly - 2 sets - 30 sec hold     Note: Objective measures were completed at Evaluation unless otherwise noted.  DIAGNOSTIC FINDINGS: NA for this episode  COGNITION: Overall cognitive status: Within functional limits for tasks assessed   SENSATION: Light touch: WFL Sometimes reports pressure/heaviness in feet  MUSCLE TONE: WFL BLEs   POSTURE: rounded shoulders and forward head  LOWER EXTREMITY ROM:   ACTIVE ROM WFL  LOWER EXTREMITY MMT:    MMT Right Eval Left Eval  Hip flexion 3+ 3+  Hip extension    Hip abduction 4 4  Hip adduction 4 4  Hip internal rotation    Hip external rotation    Knee flexion 3+ 4  Knee extension 4 4  Ankle dorsiflexion 3+ 3+  Ankle plantarflexion    Ankle inversion    Ankle eversion    (Blank rows = not tested)    TRANSFERS: Assistive device utilized: Environmental consultant - 4 wheeled  Sit to stand: Modified independence Stand to sit: Modified independence   GAIT: Gait pattern: step through pattern, decreased step length- Right, decreased stride length, and wide BOS Distance walked: 50 ft x 2 Assistive device utilized: Single  point cane and Walker - 4 wheeled Level of assistance: SBA and CGA Comments: Pt brought in her cane (round tip base with SPC)  FUNCTIONAL TESTS:  5 times sit to stand: 13.63 sec arms crossed at chest Timed up and go (TUG): 17.37 with rollator; 20.91 sec with cane  10 meter walk test: 18.97 sec with rollator (1.73 ft/sec); 27.19 sec with cane (1.21 f/tsec) Berg Balance Scale: 32/56                                                                                                                                  TREATMENT DATE: 08/14/2023      GOALS: Goals reviewed with patient? Yes  SHORT TERM GOALS: Target date: 09/12/2023  Pt will be independent with HEP for improved strength, balance, gait. Baseline: reports "some days better than others" Goal status: MET 09/22/23  2.  Pt will improve 5x sit<>stand to less than or equal to 12 sec to demonstrate improved functional strength and transfer efficiency. Baseline:  13.63 sec; 14.49 sec without UE support 09/22/23 Goal status: NOT MET 09/22/23  3.  Pt will improve Berg score to at least 38/56 to decrease fall risk. Baseline: 32/56; 49/56 09/22/23 Goal status: MET 09/22/23  4.  Pt will improve gait velocity to at least 2 ft/sec with rollator for improved gait efficiency and safety. Baseline: 1.72 ft/sec; 2.1 ft/sec 09/22/23 Goal status: MET 09/22/23  LONG TERM GOALS: Target date: 10/10/2023  Pt will be independent with progression of HEP for improved strength, balance, gait. Baseline:  Goal status: IN PROGRESS  09/22/23  2.  Pt will improve  TUG score to less than or equal to 13.5 sec for decreased fall risk. Baseline: 17.37 sec with rollator; >20 sec cane; 19.09 sec with cane 09/22/23 Goal status: IN PROGRESS 09/22/23  3.  Pt will improve Berg score to at least 45/56 to decrease fall risk. Baseline: 32/56; 49/56 09/22/23 Goal status: MET 09/22/23  4.  Pt will improve gait velocity to at least 1.8 ft/sec with cane for improved gait  efficiency and safety.  Baseline: 1.58 ft/sec with cane 09/22/23 Goal status: IN PROGRESS 09/22/23  5.  Pt will verbalize continued community fitness upon d/c from PT. Baseline: pt not performing but was educated on this 09/22/23 Goal status: IN PROGRESS 09/22/23  ASSESSMENT:  CLINICAL IMPRESSION: Patient arrived to session with report of mild LBP. Continued working on gait training with pt's cane with additional balance challenges. Patient demonstrated some instability and hesitancy with body and head turns. Also trialed gait without device- patient very apprehensive and guarded but otherwise pretty steady. Will require increased exposure to gait without device to improve confidence without and with AD. No complaints upon leaving.   OBJECTIVE IMPAIRMENTS: Abnormal gait, decreased balance, decreased mobility, difficulty walking, and decreased strength.   ACTIVITY LIMITATIONS: bending, standing, squatting, transfers, bed mobility, and locomotion level  PARTICIPATION LIMITATIONS: driving, shopping, and community activity  PERSONAL FACTORS: 3+ comorbidities: see above  are also affecting patient's functional outcome.   REHAB POTENTIAL: Good  CLINICAL DECISION MAKING: Evolving/moderate complexity  EVALUATION COMPLEXITY: Moderate  PLAN:  PT FREQUENCY: 2x/week  PT DURATION: 8 weeks plus eval visit  PLANNED INTERVENTIONS: 97110-Therapeutic exercises, 97530- Therapeutic activity, 97112- Neuromuscular re-education, 97535- Self Care, 16109- Manual therapy, [phone]- Gait training, Patient/Family education, and Balance training  PLAN FOR NEXT SESSION: reassessment- pt wants to continue; balance EC; continue working on gait with cane and EC balance; has shoulder discomfort with overhead reaching + full can   Thaddeus Filippo, PT, DPT 10/07/23 10:57 AM  [facility] at [facility] [address], [address] [place], [place] [address] Phone # [phone] Fax  # [phone]

## 2023-10-07 ENCOUNTER — Encounter: Payer: Self-pay | Admitting: Physical Therapy

## 2023-10-07 ENCOUNTER — Ambulatory Visit: Admitting: Physical Therapy

## 2023-10-07 DIAGNOSIS — M6281 Muscle weakness (generalized): Secondary | ICD-10-CM | POA: Diagnosis not present

## 2023-10-07 DIAGNOSIS — R2689 Other abnormalities of gait and mobility: Secondary | ICD-10-CM

## 2023-10-07 DIAGNOSIS — R29898 Other symptoms and signs involving the musculoskeletal system: Secondary | ICD-10-CM

## 2023-10-07 DIAGNOSIS — R2681 Unsteadiness on feet: Secondary | ICD-10-CM

## 2023-10-08 ENCOUNTER — Inpatient Hospital Stay (HOSPITAL_BASED_OUTPATIENT_CLINIC_OR_DEPARTMENT_OTHER): Payer: PPO | Admitting: Internal Medicine

## 2023-10-08 ENCOUNTER — Inpatient Hospital Stay: Payer: PPO

## 2023-10-08 VITALS — BP 145/89 | HR 110 | Temp 98.2°F | Resp 16 | Wt 160.0 lb

## 2023-10-08 VITALS — BP 118/69 | HR 97 | Temp 98.3°F | Resp 16

## 2023-10-08 DIAGNOSIS — C3491 Malignant neoplasm of unspecified part of right bronchus or lung: Secondary | ICD-10-CM

## 2023-10-08 DIAGNOSIS — Z5111 Encounter for antineoplastic chemotherapy: Secondary | ICD-10-CM | POA: Diagnosis not present

## 2023-10-08 DIAGNOSIS — C7951 Secondary malignant neoplasm of bone: Secondary | ICD-10-CM

## 2023-10-08 DIAGNOSIS — E032 Hypothyroidism due to medicaments and other exogenous substances: Secondary | ICD-10-CM

## 2023-10-08 DIAGNOSIS — Z95828 Presence of other vascular implants and grafts: Secondary | ICD-10-CM

## 2023-10-08 LAB — CBC WITH DIFFERENTIAL (CANCER CENTER ONLY)
Abs Immature Granulocytes: 0.01 10*3/uL (ref 0.00–0.07)
Basophils Absolute: 0 10*3/uL (ref 0.0–0.1)
Basophils Relative: 0 %
Eosinophils Absolute: 0.1 10*3/uL (ref 0.0–0.5)
Eosinophils Relative: 1 %
HCT: 40.9 % (ref 36.0–46.0)
Hemoglobin: 13.7 g/dL (ref 12.0–15.0)
Immature Granulocytes: 0 %
Lymphocytes Relative: 13 %
Lymphs Abs: 0.6 10*3/uL — ABNORMAL LOW (ref 0.7–4.0)
MCH: 29.7 pg (ref 26.0–34.0)
MCHC: 33.5 g/dL (ref 30.0–36.0)
MCV: 88.5 fL (ref 80.0–100.0)
Monocytes Absolute: 0.5 10*3/uL (ref 0.1–1.0)
Monocytes Relative: 11 %
Neutro Abs: 3.6 10*3/uL (ref 1.7–7.7)
Neutrophils Relative %: 75 %
Platelet Count: 211 10*3/uL (ref 150–400)
RBC: 4.62 MIL/uL (ref 3.87–5.11)
RDW: 13.3 % (ref 11.5–15.5)
WBC Count: 4.8 10*3/uL (ref 4.0–10.5)
nRBC: 0 % (ref 0.0–0.2)

## 2023-10-08 LAB — CMP (CANCER CENTER ONLY)
ALT: 13 U/L (ref 0–44)
AST: 17 U/L (ref 15–41)
Albumin: 4.2 g/dL (ref 3.5–5.0)
Alkaline Phosphatase: 93 U/L (ref 38–126)
Anion gap: 8 (ref 5–15)
BUN: 12 mg/dL (ref 8–23)
CO2: 28 mmol/L (ref 22–32)
Calcium: 9.3 mg/dL (ref 8.9–10.3)
Chloride: 105 mmol/L (ref 98–111)
Creatinine: 0.67 mg/dL (ref 0.44–1.00)
GFR, Estimated: >60 mL/min (ref >60)
Glucose, Bld: 109 mg/dL — ABNORMAL HIGH (ref 70–99)
Potassium: 3.5 mmol/L (ref 3.5–5.1)
Sodium: 141 mmol/L (ref 135–145)
Total Bilirubin: 0.7 mg/dL (ref 0.0–1.2)
Total Protein: 6.9 g/dL (ref 6.5–8.1)

## 2023-10-08 LAB — TSH: TSH: 6.12 u[IU]/mL — ABNORMAL HIGH (ref 0.350–4.500)

## 2023-10-08 MED ORDER — HEPARIN SOD (PORK) LOCK FLUSH 100 UNIT/ML IV SOLN
500.0000 [IU] | Freq: Once | INTRAVENOUS | Status: AC | PRN
  Administered 2023-10-08: 500 [IU]

## 2023-10-08 MED ORDER — PROCHLORPERAZINE MALEATE 10 MG PO TABS
10.0000 mg | ORAL_TABLET | Freq: Once | ORAL | Status: AC
  Administered 2023-10-08: 10 mg via ORAL
  Filled 2023-10-08: qty 1

## 2023-10-08 MED ORDER — SODIUM CHLORIDE 0.9 % IV SOLN: Freq: Once | INTRAVENOUS | Status: AC

## 2023-10-08 MED ORDER — CYANOCOBALAMIN 1000 MCG/ML IJ SOLN
1000.0000 ug | Freq: Once | INTRAMUSCULAR | Status: AC
  Administered 2023-10-08: 1000 ug via INTRAMUSCULAR
  Filled 2023-10-08: qty 1

## 2023-10-08 MED ORDER — SODIUM CHLORIDE 0.9% FLUSH
10.0000 mL | Freq: Once | INTRAVENOUS | Status: AC
Start: 2023-10-08 — End: 2023-10-08
  Administered 2023-10-08: 10 mL

## 2023-10-08 MED ORDER — DEXAMETHASONE SODIUM PHOSPHATE 10 MG/ML IJ SOLN
10.0000 mg | Freq: Once | INTRAMUSCULAR | Status: AC
  Administered 2023-10-08: 10 mg via INTRAVENOUS
  Filled 2023-10-08: qty 1

## 2023-10-08 MED ORDER — SODIUM CHLORIDE 0.9 % IV SOLN
500.0000 mg/m2 | Freq: Once | INTRAVENOUS | Status: AC
  Administered 2023-10-08: 900 mg via INTRAVENOUS
  Filled 2023-10-08: qty 20

## 2023-10-08 MED ORDER — SODIUM CHLORIDE 0.9% FLUSH
10.0000 mL | INTRAVENOUS | Status: DC | PRN
  Administered 2023-10-08: 10 mL

## 2023-10-08 NOTE — Progress Notes (Signed)
 [facility] [phone]   [phone]  OFFICE PROGRESS NOTE  Stamey, Lowell Rude, FNP [address] [place] [address]  DIAGNOSIS: Stage IV (T1b, N2, M1 C) non-small cell lung cancer, adenocarcinoma presented with right lower lobe lung nodule in addition to small bilateral pulmonary nodules and right hilar and mediastinal lymphadenopathy in addition to liver and bone metastasis diagnosed in April 2024. 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   PDL1: 20%   Guardant 360: no actionable mutations.    PRIOR THERAPY: 1) Decompressive thoracic laminectomy of the T7 with spinal cord decompression and biopsy of the epidural mass in April 2024.  2) Palliative radiation to the bone under the care of Dr. Lurena Sally. Last day scheduled for 10/25/22    CURRENT THERAPY: Carboplatin  for an AUC of 5, Alimta 500 mg/m, Keytruda  200 mg IV every 3 weeks.  First dose expected on 10/28/2022. Status post 14 cycles.  Starting from cycle #5 the patient is on maintenance treatment with Alimta and Keytruda  every 3 weeks.  Starting from cycle #7 the patient will be treated with single agent Alimta 500 Mg/M2 every 3 weeks.  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  INTERVAL HISTORY: Hayley Wu 73 y.o. female returns to the clinic today for follow-up visit. Discussed the use of AI scribe software for clinical note transcription with the patient, who gave verbal consent to proceed.  History of Present Illness   Hayley Wu is a 73 year old female undergoing treatment for non-small cell lung cancer who presents for evaluation before starting cycle fifteen of Alimta.  She is currently undergoing treatment with Alimta as a single agent, having completed fourteen cycles. 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  She has no new complaints since her last visit. No fatigue, weakness, nausea, vomiting, or diarrhea. She mentions a slight weight loss of about half a pound to a pound since her last visit.       MEDICAL HISTORY: Past Medical History:  Diagnosis Date   Allergy    Contact lens/glasses fitting    Hypertension     ALLERGIES:  is allergic to erythromycin, amoxicillin, latex, and prednisone .  MEDICATIONS:  Current Outpatient Medications  Medication Sig Dispense Refill   acetaminophen  (TYLENOL ) 500 MG tablet Take 500 mg by mouth every 6 (six) hours as needed for moderate pain.     amLODipine  (NORVASC ) 10 MG tablet Take 10 mg by mouth daily.     apixaban  (ELIQUIS ) 5 MG TABS tablet Take 1 tablet (5 mg total) by mouth 2 (two) times daily. 60 tablet 2   folic acid  (FOLVITE ) 1 MG tablet Take 1 tablet (1 mg total) by mouth daily. 90 tablet 1   Ipratropium-Albuterol  (COMBIVENT ) 20-100 MCG/ACT AERS respimat Inhale 1 puff into the lungs every 6 (six) hours as needed for wheezing or shortness of breath. 4 g 1   levothyroxine  (SYNTHROID ) 112 MCG tablet Take 112 mcg by mouth daily before breakfast.     lidocaine -prilocaine  (EMLA ) cream Apply 1 Application topically as needed. 30 g 2   mirtazapine  (REMERON ) 30 MG tablet TAKE 1 TABLET BY MOUTH AT BEDTIME 90 tablet 0   potassium chloride  SA (KLOR-CON  M) 20 MEQ tablet Take 1 tablet (20 mEq total) by mouth daily. (Patient not taking: Reported  on 08/14/2023) 6 tablet 0   prochlorperazine  (COMPAZINE ) 10 MG tablet Take 1 tablet (10 mg total) by mouth every 6 (six) hours as needed. (Patient taking differently: Take 10 mg by mouth every 6 (six) hours as needed for refractory nausea / vomiting.) 30 tablet 2   VITAMIN D PO Take 1 tablet by mouth daily.     No current facility-administered medications for this visit.    SURGICAL HISTORY:  Past Surgical History:  Procedure Laterality Date    ABDOMINAL HYSTERECTOMY     BREAST SURGERY  2010   rt lumpectomy-neg   IR IMAGING GUIDED PORT INSERTION  12/18/2022   LAMINECTOMY N/A 09/21/2022   Procedure: THORACIC LAMINECTOMY FOR TUMOR;  Surgeon: Isadora Mar, MD;  Location: [facility] OR;  Service: Neurosurgery;  Laterality: N/A;   OPEN REDUCTION INTERNAL FIXATION (ORIF) DISTAL RADIAL FRACTURE Left 11/26/2013   Procedure: OPEN REDUCTION INTERNAL FIXATION (ORIF) LEFT  DISTAL RADIUS ;  Surgeon: Ilean Mall, MD;  Location: [facility];  Service: Orthopedics;  Laterality: Left;  with block   TUBAL LIGATION      REVIEW OF SYSTEMS:  A comprehensive review of systems was negative.   PHYSICAL EXAMINATION: General appearance: alert, cooperative, and no distress Head: Normocephalic, without obvious abnormality, atraumatic Neck: no adenopathy, no JVD, supple, symmetrical, trachea midline, and thyroid  not enlarged, symmetric, no tenderness/mass/nodules Lymph nodes: Cervical, supraclavicular, and axillary nodes normal. Resp: clear to auscultation bilaterally Back: symmetric, no curvature. ROM normal. No CVA tenderness. Cardio: regular rate and rhythm, S1, S2 normal, no murmur, click, rub or gallop GI: soft, non-tender; bowel sounds normal; no masses,  no organomegaly Extremities: extremities normal, atraumatic, no cyanosis or edema  ECOG PERFORMANCE STATUS: 1 - Symptomatic but completely ambulatory  Blood pressure (!) 145/89, pulse (!) 110, temperature 98.2 F (36.8 C), temperature source Temporal, resp. rate 16, weight 160 lb (72.6 kg), SpO2 92%.  LABORATORY DATA: Lab Results  Component Value Date   WBC 4.8 10/08/2023   HGB 13.7 10/08/2023   HCT 40.9 10/08/2023   MCV 88.5 10/08/2023   PLT 211 10/08/2023      Chemistry      Component Value Date/Time   NA 142 09/17/2023 1046   K 3.5 09/17/2023 1046   CL 105 09/17/2023 1046   CO2 29 09/17/2023 1046   BUN 9 09/17/2023 1046   CREATININE 0.67 09/17/2023 1046      Component  Value Date/Time   CALCIUM 9.2 09/17/2023 1046   ALKPHOS 82 09/17/2023 1046   AST 15 09/17/2023 1046   ALT 11 09/17/2023 1046   BILITOT 0.4 09/17/2023 1046       RADIOGRAPHIC STUDIES: No results found.   ASSESSMENT AND PLAN: This is a very pleasant 73 years old white female with Stage IV (T1b, N2, M1 C) non-small cell lung cancer, adenocarcinoma presented with right lower lobe lung nodule in addition to small bilateral pulmonary nodules and right hilar and mediastinal lymphadenopathy in addition to liver and bone metastasis diagnosed in April 2024. 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 Molecular studies showed no actionable mutations and PD-L1 expression of 20%. 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  She also had palliative radiation to the bone under the care of Dr. Lurena Sally. Last day scheduled for 10/25/22. She is currently  on palliative systemic chemotherapy with carboplatin  for AUC of 5, Alimta 500 Mg/M2 and Keytruda  200 Mg IV every 3 weeks status post 14 cycles.  Starting from cycle #5 the patient is on maintenance treatment with Alimta and Keytruda  every 3 weeks.  After cycle #6 Keytruda  was discontinued secondary to suspicious immunotherapy mediated pneumonitis with recent hospitalization and acute respiratory failure. She continues to tolerate her treatment with single agent Alimta fairly well.    Stage IV (T1b, N2, M1 C) non-small cell lung cancer, adenocarcinoma presented with right lower lobe lung nodule in addition to small bilateral pulmonary nodules and right hilar and mediastinal lymphadenopathy in addition to liver and bone metastasis diagnosed in April 2024. She is status post decompressive thoracic laminectomy of the T7 with spinal cord decompression and biopsy of the  epidural mass in April 2024.   Undergoing treatment with Alimta as a single agent. Completed fourteen cycles and presents for evaluation before cycle fifteen. Reports no new complaints, fatigue, weakness, or side effects from Alimta, including nausea, vomiting, or diarrhea. Slight weight loss of 0.5 to 1 pound. Blood pressure and pulse are slightly elevated, likely due to dehydration. - Proceed with cycle fifteen of Alimta treatment. - Advise increased hydration. - Plan for a scan in five to six weeks. - Schedule follow-up in three weeks.  Suspicious immunotherapy-mediated pneumonitis Keytruda  was discontinued due to suspicious immunotherapy-mediated pneumonitis. No current symptoms related to pneumonitis.   The patient was advised to call immediately if she has any concerning symptoms in the interval.  The patient voices understanding of current disease status and treatment options and is in agreement with the current care plan.  All questions were answered. The patient knows to call the clinic with any problems, questions or concerns. We can certainly see the patient much sooner if necessary.  The total time spent in the appointment was 20 minutes.  Disclaimer: This note was dictated with voice recognition software. Similar sounding words can inadvertently be transcribed and may not be corrected upon review.

## 2023-10-08 NOTE — Patient Instructions (Signed)
 [facility] - A DEPT OF [facility]  Discharge Instructions: Thank you for choosing [facility] to provide your oncology and hematology care.   If you have a lab appointment with the Cancer Center, please go directly to the Cancer Center and check in at the registration area.   Wear comfortable clothing and clothing appropriate for easy access to any Portacath or PICC line.   We strive to give you quality time with your provider. You may need to reschedule your appointment if you arrive late (15 or more minutes).  Arriving late affects you and other patients whose appointments are after yours.  Also, if you miss three or more appointments without notifying the office, you may be dismissed from the clinic at the provider's discretion.      For prescription refill requests, have your pharmacy contact our office and allow 72 hours for refills to be completed.    Today you received the following chemotherapy and/or immunotherapy agents Alimta      To help prevent nausea and vomiting after your treatment, we encourage you to take your nausea medication as directed.  BELOW ARE SYMPTOMS THAT SHOULD BE REPORTED IMMEDIATELY: *FEVER GREATER THAN 100.4 F (38 C) OR HIGHER *CHILLS OR SWEATING *NAUSEA AND VOMITING THAT IS NOT CONTROLLED WITH YOUR NAUSEA MEDICATION *UNUSUAL SHORTNESS OF BREATH *UNUSUAL BRUISING OR BLEEDING *URINARY PROBLEMS (pain or burning when urinating, or frequent urination) *BOWEL PROBLEMS (unusual diarrhea, constipation, pain near the anus) TENDERNESS IN MOUTH AND THROAT WITH OR WITHOUT PRESENCE OF ULCERS (sore throat, sores in mouth, or a toothache) UNUSUAL RASH, SWELLING OR PAIN  UNUSUAL VAGINAL DISCHARGE OR ITCHING   Items with * indicate a potential emergency and should be followed up as soon as possible or go to the Emergency Department if any problems should occur.  Please show the CHEMOTHERAPY ALERT CARD or IMMUNOTHERAPY  ALERT CARD at check-in to the Emergency Department and triage nurse.  Should you have questions after your visit or need to cancel or reschedule your appointment, please contact [facility] - A DEPT OF [facility]  Dept: [phone]  and follow the prompts.  Office hours are 8:00 a.m. to 4:30 p.m. Monday - Friday. Please note that voicemails left after 4:00 p.m. may not be returned until the following business day.  We are closed weekends and major holidays. You have access to a nurse at all times for urgent questions. Please call the main number to the clinic Dept: [phone] and follow the prompts.   For any non-urgent questions, you may also contact your provider using MyChart. We now offer e-Visits for anyone 79 and older to request care online for non-urgent symptoms. For details visit [email or web address].   Also download the MyChart app! Go to the app store, search "MyChart", open the app, select [name], and log in with your MyChart username and password.

## 2023-10-09 LAB — T4: T4, Total: 13.4 ug/dL — ABNORMAL HIGH (ref 4.5–12.0)

## 2023-10-10 ENCOUNTER — Ambulatory Visit: Attending: Family Medicine | Admitting: Physical Therapy

## 2023-10-10 ENCOUNTER — Encounter: Payer: Self-pay | Admitting: Physical Therapy

## 2023-10-10 DIAGNOSIS — R2681 Unsteadiness on feet: Secondary | ICD-10-CM | POA: Diagnosis not present

## 2023-10-10 DIAGNOSIS — R29898 Other symptoms and signs involving the musculoskeletal system: Secondary | ICD-10-CM | POA: Insufficient documentation

## 2023-10-10 DIAGNOSIS — R2689 Other abnormalities of gait and mobility: Secondary | ICD-10-CM | POA: Insufficient documentation

## 2023-10-10 DIAGNOSIS — M6281 Muscle weakness (generalized): Secondary | ICD-10-CM | POA: Diagnosis not present

## 2023-10-10 DIAGNOSIS — R262 Difficulty in walking, not elsewhere classified: Secondary | ICD-10-CM | POA: Diagnosis not present

## 2023-10-10 NOTE — Therapy (Signed)
 OUTPATIENT PHYSICAL THERAPY NEURO TREATMENT NOTE/RECERT   Patient Name: Hayley Wu MRN: [id number] DOB:06-Jan-1951, 73 y.o., female Today's Date: 10/10/2023   PCP: Therman Flatter REFERRING PROVIDER: Ayesha Lente Lowell Rude, FNP       END OF SESSION:  PT End of Session - 10/10/23 1016     Visit Number 14    Number of Visits 26    Date for PT Re-Evaluation 11/21/23    Authorization Type [employer]    Progress Note Due on Visit 20    PT Start Time 1017    PT Stop Time 1058    PT Time Calculation (min) 41 min    Equipment Utilized During Treatment Gait belt    Activity Tolerance Patient tolerated treatment well    Behavior During Therapy WFL for tasks assessed/performed;Anxious                      Past Medical History:  Diagnosis Date   Allergy    Contact lens/glasses fitting    Hypertension    Past Surgical History:  Procedure Laterality Date   ABDOMINAL HYSTERECTOMY     BREAST SURGERY  2010   rt lumpectomy-neg   IR IMAGING GUIDED PORT INSERTION  12/18/2022   LAMINECTOMY N/A 09/21/2022   Procedure: THORACIC LAMINECTOMY FOR TUMOR;  Surgeon: Isadora Mar, MD;  Location: [facility] OR;  Service: Neurosurgery;  Laterality: N/A;   OPEN REDUCTION INTERNAL FIXATION (ORIF) DISTAL RADIAL FRACTURE Left 11/26/2013   Procedure: OPEN REDUCTION INTERNAL FIXATION (ORIF) LEFT  DISTAL RADIUS ;  Surgeon: Ilean Mall, MD;  Location: [facility];  Service: Orthopedics;  Laterality: Left;  with block   TUBAL LIGATION     Patient Active Problem List   Diagnosis Date Noted   Acute deep vein thrombosis (DVT) of femoral vein of both lower extremities (HCC) 03/19/2023   Normocytic anemia 03/01/2023   Moderate protein malnutrition (HCC) 03/01/2023   Acute respiratory failure with hypoxia (HCC) 03/01/2023   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   Cardiomegaly 03/01/2023   Pleural effusion due to CHF (congestive heart failure) (HCC)  03/01/2023   Port-A-Cath in place 12/30/2022   Encounter for antineoplastic immunotherapy 11/18/2022   Encounter for antineoplastic chemotherapy 11/18/2022   Constipation 11/05/2022   Goals of care, counseling/discussion 10/21/2022   Cancer, metastatic to bone (HCC) 10/04/2022   Adenocarcinoma of right lung, stage 4 (HCC) 10/01/2022   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 09/20/2022   Hypokalemia 09/20/2022   Lung mass 09/20/2022   Hypothyroidism 09/20/2022   Cholelithiasis 09/20/2022   Lumbar spondylosis 08/26/2022   Thoracic back pain 04/04/2022   Low back pain 04/04/2022   Hypertension 09/23/2011    ONSET DATE: 08/08/2023 (MD order)  REFERRING DIAG:  Z91.81 (ICD-10-CM) - History of falling  C34.91 (ICD-10-CM) - Malignant neoplasm of unspecified part of right bronchus or lung    THERAPY DIAG:  Unsteadiness on feet  Other abnormalities of gait and mobility  Muscle weakness (generalized)  Rationale for Evaluation and Treatment: Rehabilitation  SUBJECTIVE:  SUBJECTIVE STATEMENT: I want to keep coming if at all possible.  Still think there are things in regards to balance, endurance that PT can help me with.  Trying to use the cane more and more-maybe 50/50 with rollator.     Pt accompanied by: self  PERTINENT HISTORY: Stage IV (T1b, N2, M1 C) non-small cell lung cancer, adenocarcinoma presented with right lower lobe lung nodule in addition to small bilateral pulmonary nodules and right hilar and mediastinal lymphadenopathy in addition to liver and bone metastasis diagnosed in April 2024. She is status post decompressive thoracic laminectomy of the T7  with spinal cord decompression and biopsy of the epidural mass in April 2024.    PAIN:  Are you having pain? Yes: NPRS scale: 1/10 Pain location: low back Pain description: sore Aggravating factors: bending, sitting wrong  Relieving factors: tylenol    PRECAUTIONS: Fall and Other: currently in treatment for lung cancer  RED FLAGS: None   WEIGHT BEARING RESTRICTIONS: No  FALLS: Has patient fallen in last 6 months? No  LIVING ENVIRONMENT: Lives with: lives alone Lives in: House/apartment Stairs: 1 step to get into home Has following equipment at home: Single point cane, Environmental consultant - 4 wheeled, Wheelchair (manual), and shower chair  PLOF: Independent with household mobility with device and Independent with community mobility with device  PATIENT GOALS: Still need to work on balance, endurance, and would like to work on cane more.  OBJECTIVE:    TODAY'S TREATMENT: 10/10/2023 Activity Comments  TUG with cane:19.06 sec 17.16 sec       10 M walk:  18.66 sec cane No device:  21.15 ft/sec   FTSTS 18 sec Arms crossed at chest  DGI:  11/24 See below  Reviewed additions to HEP:  feet apart/feet together with EO/EC head turns/nods Cues for very light UE support with EC feet together, for improved stability  Forward/back step and weightshift x 10 reps      [facility] PT Assessment - 10/10/23 1028       Standardized Balance Assessment   Standardized Balance Assessment Dynamic Gait Index      Dynamic Gait Index   Level Surface Moderate Impairment   13.32 sec-NO cane   Change in Gait Speed Mild Impairment    Gait with Horizontal Head Turns Moderate Impairment   14.15 sec unsteady   Gait with Vertical Head Turns Moderate Impairment   15.5 unsteady   Gait and Pivot Turn Moderate Impairment   3.32 sec   Step Over Obstacle Moderate Impairment   uses cane   Step Around Obstacles Mild Impairment   uses cane   Steps Mild Impairment    Total Score 11    DGI comment: Scores <19/24 indicate  increased fall risk             M-CTSIB  Condition 1: Firm Surface, EO 30 Sec, Mild Sway  Condition 2: Firm Surface, EC 28 Sec, Moderate and Severe Sway  Condition 3: Foam Surface, EO NT Sec,  NT  Sway  Condition 4: Foam Surface, EC NT Sec,  NT  Sway     PATIENT EDUCATION: Education details: Discussed POC and progress towards goals; fall risk per objective measures today; discussed pt's goals-improved balance and gait with cane, improved confidence with curb/single step out of home with cane (currently does this with rollator and supervision) Person educated: Patient Education method: Explanation, Demonstration, Tactile cues, and Verbal cues Education comprehension: verbalized understanding and returned demonstration     HOME EXERCISE PROGRAM: Access  Code: [email or web address] URL: [email or web address] Date: 10/03/2023 Prepared by: [facility] - Outpatient  Rehab - [facility]  Program Notes practice walking inside the house with your cane for 5 minutes per day.   Exercises - Seated Hamstring Curls with Resistance  - 1 x daily - 5 x weekly - 3 sets - 10 reps - Seated Knee Extension with Resistance  - 1 x daily - 5 x weekly - 3 sets - 10 reps - Seated Hip Abduction with Resistance  - 1 x daily - 5 x weekly - 2 sets - 10 reps - Side Stepping with Counter Support  - 1 x daily - 5 x weekly - 2-3 sets - 10 reps - Alternating Step Taps with Counter Support  - 1 x daily - 5 x weekly - 3 sets - 10 reps - Backward Walking with Counter Support  - 1 x daily - 5 x weekly - 1 sets - 5 reps - Squat with Chair Touch  - 1 x daily - 5 x weekly - 2 sets - 10 reps - Corner Balance Feet Together With Eyes Open  - 1 x daily - 5 x weekly - 2 sets - 30 sec hold - Corner Balance Feet Apart: Eyes Open With Head Turns  - 1 x daily - 5 x weekly - 2 sets - 30 sec hold - Romberg Stance with Head Nods  - 1 x daily - 5 x weekly - 2 sets - 30 sec hold     Note: Objective measures were completed  at Evaluation unless otherwise noted.  DIAGNOSTIC FINDINGS: NA for this episode  COGNITION: Overall cognitive status: Within functional limits for tasks assessed   SENSATION: Light touch: WFL Sometimes reports pressure/heaviness in feet  MUSCLE TONE: WFL BLEs   POSTURE: rounded shoulders and forward head  LOWER EXTREMITY ROM:   ACTIVE ROM WFL  LOWER EXTREMITY MMT:    MMT Right Eval Left Eval  Hip flexion 3+ 3+  Hip extension    Hip abduction 4 4  Hip adduction 4 4  Hip internal rotation    Hip external rotation    Knee flexion 3+ 4  Knee extension 4 4  Ankle dorsiflexion 3+ 3+  Ankle plantarflexion    Ankle inversion    Ankle eversion    (Blank rows = not tested)    TRANSFERS: Assistive device utilized: Environmental consultant - 4 wheeled  Sit to stand: Modified independence Stand to sit: Modified independence   GAIT: Gait pattern: step through pattern, decreased step length- Right, decreased stride length, and wide BOS Distance walked: 50 ft x 2 Assistive device utilized: Single point cane and Walker - 4 wheeled Level of assistance: SBA and CGA Comments: Pt brought in her cane (round tip base with SPC)  FUNCTIONAL TESTS:  5 times sit to stand: 13.63 sec arms crossed at chest Timed up and go (TUG): 17.37 with rollator; 20.91 sec with cane  10 meter walk test: 18.97 sec with rollator (1.73 ft/sec); 27.19 sec with cane (1.21 f/tsec) Berg Balance Scale: 32/56  [facility] PT Assessment - 10/10/23 1028       Standardized Balance Assessment   Standardized Balance Assessment Dynamic Gait Index      Dynamic Gait Index   Level Surface Moderate Impairment   13.32 sec-NO cane   Change in Gait Speed Mild Impairment    Gait with Horizontal Head Turns Moderate Impairment   14.15 sec unsteady   Gait with Vertical Head Turns Moderate Impairment  15.5 unsteady   Gait and Pivot Turn Moderate Impairment   3.32 sec   Step Over Obstacle Moderate Impairment   uses cane   Step Around  Obstacles Mild Impairment   uses cane   Steps Mild Impairment    Total Score 11    DGI comment: Scores <19/24 indicate increased fall risk                                                                                                                                            TREATMENT DATE: 08/14/2023      GOALS: Goals reviewed with patient? Yes  SHORT TERM GOALS: Target date: 09/12/2023  Pt will be independent with HEP for improved strength, balance, gait. Baseline: reports "some days better than others" Goal status: MET 09/22/23  2.  Pt will improve 5x sit<>stand to less than or equal to 12 sec to demonstrate improved functional strength and transfer efficiency. Baseline:  13.63 sec; 14.49 sec without UE support 09/22/23 Goal status: NOT MET 09/22/23  3.  Pt will improve Berg score to at least 38/56 to decrease fall risk. Baseline: 32/56; 49/56 09/22/23 Goal status: MET 09/22/23  4.  Pt will improve gait velocity to at least 2 ft/sec with rollator for improved gait efficiency and safety. Baseline: 1.72 ft/sec; 2.1 ft/sec 09/22/23 Goal status: MET 09/22/23  LONG TERM GOALS: Target date: 10/10/2023>UPDATED TARGET 11/21/2023  Pt will be independent with progression of HEP for improved strength, balance, gait. Baseline:  Goal status: IN PROGRESS  10/10/23  2.  Pt will improve TUG score to less than or equal to 13.5 sec for decreased fall risk. Baseline: 17.37 sec with rollator; >20 sec cane; 19.09 sec with cane 09/22/23>17 sec with cane 10/10/2023 Goal status: IN PROGRESS 10/10/2023  3.  Pt will improve Berg score to at least 45/56 to decrease fall risk. Baseline: 32/56; 49/56 09/22/23 Goal status: MET 09/22/23  4.  Pt will improve gait velocity to at least 1.8 ft/sec with cane for improved gait efficiency and safety.  Baseline: 1.58 ft/sec with cane 09/22/23, 1.75 ft/sec with cane 10/10/2023 Goal status: IN PROGRESS 10/10/23  5.  Pt will verbalize continued community fitness upon d/c  from PT. Baseline: pt not performing but was educated on this 09/22/23 Goal status: IN PROGRESS 10/10/23  6. Pt will improve DGI score to at least 18/24 to decrease fall risk.  Baseline:  11/24  Goal status:  INITIAL 10/10/2023  7.  Pt will negotiate single curb step with cane and supervision for improved community mobility.  Baseline:  typically uses rollator and family assist  Goal status:  INITIAL, 10/10/2023  ASSESSMENT:  CLINICAL IMPRESSION: Pt presents today with no new complaints. Skilled PT session focused on assessing LTGs.   She has improved with mobility measures, as initial measures were taken with rollator and today, all measures taken with cane/no device today.  Pt has improved TUG, gait velocity measures.  Berg improved (previously from 32 to 49/56).  TUG score with cane of 17 sec and gait velocity of 1.75 ft/sec are improved, but do still indicate increased fall risk.  DGI score of 11/24 indicates increased fall risk.  She is making progress and she continues to be guarded with cane and especially with no device.  She will benefit from continued skilled PT to further progress HEP to improve balance, functional strength, gait independence and curb/step negotiation safety.     OBJECTIVE IMPAIRMENTS: Abnormal gait, decreased balance, decreased mobility, difficulty walking, and decreased strength.   ACTIVITY LIMITATIONS: bending, standing, squatting, transfers, bed mobility, and locomotion level  PARTICIPATION LIMITATIONS: driving, shopping, and community activity  PERSONAL FACTORS: 3+ comorbidities: see above  are also affecting patient's functional outcome.   REHAB POTENTIAL: Good  CLINICAL DECISION MAKING: Evolving/moderate complexity  EVALUATION COMPLEXITY: Moderate  PLAN:  PT FREQUENCY: 2x/week  PT DURATION: 6 weeks per recert 10/10/2023  PLANNED INTERVENTIONS: 97750- Physical Performance Testing, 97110-Therapeutic exercises, 97530- Therapeutic activity, 97112-  Neuromuscular re-education, 97535- Self Care, 87564- Manual therapy, [id number]- Gait training, Patient/Family education, and Balance training  PLAN FOR NEXT SESSION: Continue working on balance EC; continue working on gait with cane; curb/single step negotiation.   has shoulder discomfort with overhead reaching + full can   Dessie Flow, PT 10/10/23 12:15 PM Phone: [phone] Fax: [phone]   [facility] at [facility] [address] [place], [address] [place], [place] [address] Phone # [phone] Fax # [phone]

## 2023-10-11 ENCOUNTER — Other Ambulatory Visit: Payer: Self-pay

## 2023-10-16 NOTE — Therapy (Signed)
 OUTPATIENT PHYSICAL THERAPY NEURO TREATMENT NOTE   Patient Name: Hayley Wu MRN: [id number] DOB:[birth year], 73 y.o., female Today's Date: 10/20/2023   PCP: Therman Flatter REFERRING PROVIDER: Ayesha Lente Lowell Rude, FNP       END OF SESSION:  PT End of Session - 10/20/23 1056     Visit Number 15    Number of Visits 26    Date for PT Re-Evaluation 11/21/23    Authorization Type [employer]    Progress Note Due on Visit 20    PT Start Time 1017    PT Stop Time 1057    PT Time Calculation (min) 40 min    Equipment Utilized During Treatment Gait belt    Activity Tolerance Patient tolerated treatment well    Behavior During Therapy WFL for tasks assessed/performed                       Past Medical History:  Diagnosis Date   Allergy    Contact lens/glasses fitting    Hypertension    Past Surgical History:  Procedure Laterality Date   ABDOMINAL HYSTERECTOMY     BREAST SURGERY  2010   rt lumpectomy-neg   IR IMAGING GUIDED PORT INSERTION  12/18/2022   LAMINECTOMY N/A 09/21/2022   Procedure: THORACIC LAMINECTOMY FOR TUMOR;  Surgeon: Isadora Mar, MD;  Location: [facility] OR;  Service: Neurosurgery;  Laterality: N/A;   OPEN REDUCTION INTERNAL FIXATION (ORIF) DISTAL RADIAL FRACTURE Left 11/26/2013   Procedure: OPEN REDUCTION INTERNAL FIXATION (ORIF) LEFT  DISTAL RADIUS ;  Surgeon: Ilean Mall, MD;  Location: [facility];  Service: Orthopedics;  Laterality: Left;  with block   TUBAL LIGATION     Patient Active Problem List   Diagnosis Date Noted   Acute deep vein thrombosis (DVT) of femoral vein of both lower extremities (HCC) 03/19/2023   Normocytic anemia 03/01/2023   Moderate protein malnutrition (HCC) 03/01/2023   Acute respiratory failure with hypoxia (HCC) 03/01/2023   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   Cardiomegaly 03/01/2023   Pleural effusion due to CHF (congestive heart failure) (HCC) 03/01/2023    Port-A-Cath in place 12/30/2022   Encounter for antineoplastic immunotherapy 11/18/2022   Encounter for antineoplastic chemotherapy 11/18/2022   Constipation 11/05/2022   Goals of care, counseling/discussion 10/21/2022   Cancer, metastatic to bone (HCC) 10/04/2022   Adenocarcinoma of right lung, stage 4 (HCC) 10/01/2022   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 09/20/2022   Hypokalemia 09/20/2022   Lung mass 09/20/2022   Hypothyroidism 09/20/2022   Cholelithiasis 09/20/2022   Lumbar spondylosis 08/26/2022   Thoracic back pain 04/04/2022   Low back pain 04/04/2022   Hypertension 09/23/2011    ONSET DATE: 08/08/2023 (MD order)  REFERRING DIAG:  Z91.81 (ICD-10-CM) - History of falling  C34.91 (ICD-10-CM) - Malignant neoplasm of unspecified part of right bronchus or lung    THERAPY DIAG:  Unsteadiness on feet  Other abnormalities of gait and mobility  Muscle weakness (generalized)  Other symptoms and signs involving the musculoskeletal system  Difficulty in walking, not elsewhere classified  Rationale for Evaluation and Treatment: Rehabilitation  SUBJECTIVE:  SUBJECTIVE STATEMENT: Had a good weekend. Still not 100% confident with the cane. Using it 60% of the time inside the house.   Pt accompanied by: self  PERTINENT HISTORY: Stage IV (T1b, N2, M1 C) non-small cell lung cancer, adenocarcinoma presented with right lower lobe lung nodule in addition to small bilateral pulmonary nodules and right hilar and mediastinal lymphadenopathy in addition to liver and bone metastasis diagnosed in April 2024. She is status post decompressive thoracic laminectomy of the  T7 with spinal cord decompression and biopsy of the epidural mass in April 2024.    PAIN:  Are you having pain? Yes: NPRS scale: 3/10 Pain location: low back Pain description: sore Aggravating factors: bending, sitting wrong  Relieving factors: tylenol    PRECAUTIONS: Fall and Other: currently in treatment for lung cancer  RED FLAGS: None   WEIGHT BEARING RESTRICTIONS: No  FALLS: Has patient fallen in last 6 months? No  LIVING ENVIRONMENT: Lives with: lives alone Lives in: House/apartment Stairs: 1 step to get into home Has following equipment at home: Single point cane, Environmental consultant - 4 wheeled, Wheelchair (manual), and shower chair  PLOF: Independent with household mobility with device and Independent with community mobility with device  PATIENT GOALS: Still need to work on balance, endurance, and would like to work on cane more.  OBJECTIVE:     TODAY'S TREATMENT: 10/20/23 Activity Comments  Nustep L4 x 5 min LEs only  For LE ROM and strength  gait training without AD: straight ahead walking performing steps over beanbags Bending to pick up beanbags from floor  figure 8 turns Stair navigation (alt reciprocal with 1 and 2 UE support) CGA; occasional LOB with good self-correction  C/o LE fatigue when walking without device.   Some remaining lacking eccentric control when descending.   STS with 7# 2x10 Good form  gait training with cane: figure 8 turns Stair navigation (with and without UE support on rail) Bending to pick up beanbags from floor  Great sequencing, step length with turns                HOME EXERCISE PROGRAM: Access Code: [email or web address] URL: [email or web address] Date: 10/03/2023 Prepared by: [facility] - Outpatient  Rehab - [facility]  Program Notes practice walking inside the house with your cane for 5 minutes per day.   Exercises - Seated Hamstring Curls with Resistance  - 1 x daily - 5 x weekly - 3 sets - 10 reps - Seated  Knee Extension with Resistance  - 1 x daily - 5 x weekly - 3 sets - 10 reps - Seated Hip Abduction with Resistance  - 1 x daily - 5 x weekly - 2 sets - 10 reps - Side Stepping with Counter Support  - 1 x daily - 5 x weekly - 2-3 sets - 10 reps - Alternating Step Taps with Counter Support  - 1 x daily - 5 x weekly - 3 sets - 10 reps - Backward Walking with Counter Support  - 1 x daily - 5 x weekly - 1 sets - 5 reps - Squat with Chair Touch  - 1 x daily - 5 x weekly - 2 sets - 10 reps - Corner Balance Feet Together With Eyes Open  - 1 x daily - 5 x weekly - 2 sets - 30 sec hold - Corner Balance Feet Apart: Eyes Open With Head Turns  - 1 x daily - 5 x weekly - 2 sets - 30 sec hold - Romberg  Stance with Head Nods  - 1 x daily - 5 x weekly - 2 sets - 30 sec hold     Note: Objective measures were completed at Evaluation unless otherwise noted.  DIAGNOSTIC FINDINGS: NA for this episode  COGNITION: Overall cognitive status: Within functional limits for tasks assessed   SENSATION: Light touch: WFL Sometimes reports pressure/heaviness in feet  MUSCLE TONE: WFL BLEs   POSTURE: rounded shoulders and forward head  LOWER EXTREMITY ROM:   ACTIVE ROM WFL  LOWER EXTREMITY MMT:    MMT Right Eval Left Eval  Hip flexion 3+ 3+  Hip extension    Hip abduction 4 4  Hip adduction 4 4  Hip internal rotation    Hip external rotation    Knee flexion 3+ 4  Knee extension 4 4  Ankle dorsiflexion 3+ 3+  Ankle plantarflexion    Ankle inversion    Ankle eversion    (Blank rows = not tested)    TRANSFERS: Assistive device utilized: Environmental consultant - 4 wheeled  Sit to stand: Modified independence Stand to sit: Modified independence   GAIT: Gait pattern: step through pattern, decreased step length- Right, decreased stride length, and wide BOS Distance walked: 50 ft x 2 Assistive device utilized: Single point cane and Walker - 4 wheeled Level of assistance: SBA and CGA Comments: Pt brought in her  cane (round tip base with SPC)  FUNCTIONAL TESTS:  5 times sit to stand: 13.63 sec arms crossed at chest Timed up and go (TUG): 17.37 with rollator; 20.91 sec with cane  10 meter walk test: 18.97 sec with rollator (1.73 ft/sec); 27.19 sec with cane (1.21 f/tsec) Berg Balance Scale: 32/56                                                                                                                                   TREATMENT DATE: 08/14/2023      GOALS: Goals reviewed with patient? Yes  SHORT TERM GOALS: Target date: 09/12/2023  Pt will be independent with HEP for improved strength, balance, gait. Baseline: reports "some days better than others" Goal status: MET 09/22/23  2.  Pt will improve 5x sit<>stand to less than or equal to 12 sec to demonstrate improved functional strength and transfer efficiency. Baseline:  13.63 sec; 14.49 sec without UE support 09/22/23 Goal status: NOT MET 09/22/23  3.  Pt will improve Berg score to at least 38/56 to decrease fall risk. Baseline: 32/56; 49/56 09/22/23 Goal status: MET 09/22/23  4.  Pt will improve gait velocity to at least 2 ft/sec with rollator for improved gait efficiency and safety. Baseline: 1.72 ft/sec; 2.1 ft/sec 09/22/23 Goal status: MET 09/22/23  LONG TERM GOALS: Target date: 10/10/2023>UPDATED TARGET 11/21/2023  Pt will be independent with progression of HEP for improved strength, balance, gait. Baseline:  Goal status: IN PROGRESS  10/10/23  2.  Pt will improve TUG score to less than or equal  to 13.5 sec for decreased fall risk. Baseline: 17.37 sec with rollator; >20 sec cane; 19.09 sec with cane 09/22/23>17 sec with cane 10/10/2023 Goal status: IN PROGRESS 10/10/2023  3.  Pt will improve Berg score to at least 45/56 to decrease fall risk. Baseline: 32/56; 49/56 09/22/23 Goal status: MET 09/22/23  4.  Pt will improve gait velocity to at least 1.8 ft/sec with cane for improved gait efficiency and safety.  Baseline: 1.58 ft/sec with  cane 09/22/23, 1.75 ft/sec with cane 10/10/2023 Goal status: IN PROGRESS 10/10/23  5.  Pt will verbalize continued community fitness upon d/c from PT. Baseline: pt not performing but was educated on this 09/22/23 Goal status: IN PROGRESS 10/10/23  6. Pt will improve DGI score to at least 18/24 to decrease fall risk.  Baseline:  11/24  Goal status:  INITIAL 10/10/2023  7.  Pt will negotiate single curb step with cane and supervision for improved community mobility.  Baseline:  typically uses rollator and family assist  Goal status:  INITIAL, 10/10/2023  ASSESSMENT:  CLINICAL IMPRESSION: Patient arrived to session without new complaints. Continued working on gait training without AD in order to improve balance confidence. Patient still appears apprehensive with turns with and without AD. Confidence and balance with bending to pick up items from floor appeared more confident after practicing without AD first. Stair navigation today with alternating reciprocal pattern appeared improved and patient would be safe to use cane with stairs at this time. No complaints at end of session.    OBJECTIVE IMPAIRMENTS: Abnormal gait, decreased balance, decreased mobility, difficulty walking, and decreased strength.   ACTIVITY LIMITATIONS: bending, standing, squatting, transfers, bed mobility, and locomotion level  PARTICIPATION LIMITATIONS: driving, shopping, and community activity  PERSONAL FACTORS: 3+ comorbidities: see above are also affecting patient's functional outcome.   REHAB POTENTIAL: Good  CLINICAL DECISION MAKING: Evolving/moderate complexity  EVALUATION COMPLEXITY: Moderate  PLAN:  PT FREQUENCY: 2x/week  PT DURATION: 6 weeks per recert 10/10/2023  PLANNED INTERVENTIONS: 97750- Physical Performance Testing, 97110-Therapeutic exercises, 97530- Therapeutic activity, 97112- Neuromuscular re-education, 97535- Self Care, 28413- Manual therapy, [phone]- Gait training, Patient/Family education, and  Balance training  PLAN FOR NEXT SESSION: Continue working on balance EC; continue working on gait with cane; curb/single step negotiation.   has shoulder discomfort with overhead reaching + full can  Thaddeus Filippo, [place], DPT 10/20/23 10:58 AM  [facility] at [facility] [address] [place], [address] [place], [place] [address] Phone # [phone] Fax # [phone]

## 2023-10-20 ENCOUNTER — Ambulatory Visit: Admitting: Physical Therapy

## 2023-10-20 ENCOUNTER — Encounter: Payer: Self-pay | Admitting: Physical Therapy

## 2023-10-20 DIAGNOSIS — M6281 Muscle weakness (generalized): Secondary | ICD-10-CM

## 2023-10-20 DIAGNOSIS — R2681 Unsteadiness on feet: Secondary | ICD-10-CM

## 2023-10-20 DIAGNOSIS — R262 Difficulty in walking, not elsewhere classified: Secondary | ICD-10-CM

## 2023-10-20 DIAGNOSIS — R29898 Other symptoms and signs involving the musculoskeletal system: Secondary | ICD-10-CM

## 2023-10-20 DIAGNOSIS — R2689 Other abnormalities of gait and mobility: Secondary | ICD-10-CM

## 2023-10-23 ENCOUNTER — Ambulatory Visit

## 2023-10-23 DIAGNOSIS — R262 Difficulty in walking, not elsewhere classified: Secondary | ICD-10-CM

## 2023-10-23 DIAGNOSIS — R29898 Other symptoms and signs involving the musculoskeletal system: Secondary | ICD-10-CM

## 2023-10-23 DIAGNOSIS — R2681 Unsteadiness on feet: Secondary | ICD-10-CM | POA: Diagnosis not present

## 2023-10-23 DIAGNOSIS — M6281 Muscle weakness (generalized): Secondary | ICD-10-CM

## 2023-10-23 DIAGNOSIS — R2689 Other abnormalities of gait and mobility: Secondary | ICD-10-CM

## 2023-10-23 NOTE — Therapy (Signed)
 OUTPATIENT PHYSICAL THERAPY NEURO TREATMENT NOTE   Patient Name: Hayley Wu MRN: [id number] DOB:1950-12-03, 73 y.o., female Today's Date: 10/27/2023   PCP: Therman Flatter REFERRING PROVIDER: Ayesha Lente Lowell Rude, FNP       END OF SESSION:  PT End of Session - 10/27/23 1057     Visit Number 17    Number of Visits 26    Date for PT Re-Evaluation 11/21/23    Authorization Type [employer]    Progress Note Due on Visit 20    PT Start Time 1015    PT Stop Time 1059    PT Time Calculation (min) 44 min    Equipment Utilized During Treatment Gait belt    Activity Tolerance Patient tolerated treatment well    Behavior During Therapy WFL for tasks assessed/performed                        Past Medical History:  Diagnosis Date   Allergy    Contact lens/glasses fitting    Hypertension    Past Surgical History:  Procedure Laterality Date   ABDOMINAL HYSTERECTOMY     BREAST SURGERY  2010   rt lumpectomy-neg   IR IMAGING GUIDED PORT INSERTION  12/18/2022   LAMINECTOMY N/A 09/21/2022   Procedure: THORACIC LAMINECTOMY FOR TUMOR;  Surgeon: Isadora Mar, MD;  Location: [facility] OR;  Service: Neurosurgery;  Laterality: N/A;   OPEN REDUCTION INTERNAL FIXATION (ORIF) DISTAL RADIAL FRACTURE Left 11/26/2013   Procedure: OPEN REDUCTION INTERNAL FIXATION (ORIF) LEFT  DISTAL RADIUS ;  Surgeon: Ilean Mall, MD;  Location:  SURGERY CENTER;  Service: Orthopedics;  Laterality: Left;  with block   TUBAL LIGATION     Patient Active Problem List   Diagnosis Date Noted   Acute deep vein thrombosis (DVT) of femoral vein of both lower extremities (HCC) 03/19/2023   Normocytic anemia 03/01/2023   Moderate protein malnutrition (HCC) 03/01/2023   Acute respiratory failure with hypoxia (HCC) 03/01/2023   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   Cardiomegaly 03/01/2023   Pleural effusion due to CHF (congestive heart failure) (HCC) 03/01/2023    Port-A-Cath in place 12/30/2022   Encounter for antineoplastic immunotherapy 11/18/2022   Encounter for antineoplastic chemotherapy 11/18/2022   Constipation 11/05/2022   Goals of care, counseling/discussion 10/21/2022   Cancer, metastatic to bone (HCC) 10/04/2022   Adenocarcinoma of right lung, stage 4 (HCC) 10/01/2022   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 09/20/2022   Hypokalemia 09/20/2022   Lung mass 09/20/2022   Hypothyroidism 09/20/2022   Cholelithiasis 09/20/2022   Lumbar spondylosis 08/26/2022   Thoracic back pain 04/04/2022   Low back pain 04/04/2022   Hypertension 09/23/2011    ONSET DATE: 08/08/2023 (MD order)  REFERRING DIAG:  Z91.81 (ICD-10-CM) - History of falling  C34.91 (ICD-10-CM) - Malignant neoplasm of unspecified part of right bronchus or lung    THERAPY DIAG:  Unsteadiness on feet  Other abnormalities of gait and mobility  Muscle weakness (generalized)  Other symptoms and signs involving the musculoskeletal system  Difficulty in walking, not elsewhere classified  Rationale for Evaluation and Treatment: Rehabilitation  SUBJECTIVE:  SUBJECTIVE STATEMENT: Nothing new. Was sore in her thighs after last session.   Pt accompanied by: self  PERTINENT HISTORY: Stage IV (T1b, N2, M1 C) non-small cell lung cancer, adenocarcinoma presented with right lower lobe lung nodule in addition to small bilateral pulmonary nodules and right hilar and mediastinal lymphadenopathy in addition to liver and bone metastasis diagnosed in April 2024. She is status post decompressive thoracic laminectomy of the T7 with spinal cord decompression and biopsy  of the epidural mass in April 2024.    PAIN:  Are you having pain? Yes: NPRS scale: 2/10 Pain location: low back Pain description: sore Aggravating factors: bending, sitting wrong  Relieving factors: tylenol    PRECAUTIONS: Fall and Other: currently in treatment for lung cancer  RED FLAGS: None   WEIGHT BEARING RESTRICTIONS: No  FALLS: Has patient fallen in last 6 months? No  LIVING ENVIRONMENT: Lives with: lives alone Lives in: House/apartment Stairs: 1 step to get into home Has following equipment at home: Single point cane, Environmental consultant - 4 wheeled, Wheelchair (manual), and shower chair  PLOF: Independent with household mobility with device and Independent with community mobility with device  PATIENT GOALS: Still need to work on balance, endurance, and would like to work on cane more.  OBJECTIVE:     TODAY'S TREATMENT: 10/27/23 Activity Comments  gait training with cane on grass, up/down curbs, ramps  0.14 miles with CGA/SBA; pt slows down with changes in surface level but did not show instability. Required reminder of proper sequencing for curb navigation   gait + head turns/nods with cane, then without cane  Good stability with cane. Few episodes of imbalance but good ability to recover on her own  -bending to pick up cone from floor, placing it on overhead surface -walking to retreive beanbags from different locations in clinic, to toss into basket Without cane; good stability; CGA provided      PATIENT EDUCATION: Education details: discussion on pt's remaining challenges at home including vacuuming, driving and provided suggestions on making these activities safer Person educated: Patient Education method: Explanation Education comprehension: verbalized understanding     HOME EXERCISE PROGRAM: Access Code: [email or web address] URL: [email or web address] Date: 10/03/2023 Prepared by: [facility] - Outpatient  Rehab - [facility]  Program Notes practice  walking inside the house with your cane for 5 minutes per day.   Exercises - Seated Hamstring Curls with Resistance  - 1 x daily - 5 x weekly - 3 sets - 10 reps - Seated Knee Extension with Resistance  - 1 x daily - 5 x weekly - 3 sets - 10 reps - Seated Hip Abduction with Resistance  - 1 x daily - 5 x weekly - 2 sets - 10 reps - Side Stepping with Counter Support  - 1 x daily - 5 x weekly - 2-3 sets - 10 reps - Alternating Step Taps with Counter Support  - 1 x daily - 5 x weekly - 3 sets - 10 reps - Backward Walking with Counter Support  - 1 x daily - 5 x weekly - 1 sets - 5 reps - Squat with Chair Touch  - 1 x daily - 5 x weekly - 2 sets - 10 reps - Corner Balance Feet Together With Eyes Open  - 1 x daily - 5 x weekly - 2 sets - 30 sec hold - Corner Balance Feet Apart: Eyes Open With Head Turns  - 1 x daily - 5 x weekly - 2 sets - 30 sec  hold - Romberg Stance with Head Nods  - 1 x daily - 5 x weekly - 2 sets - 30 sec hold     Note: Objective measures were completed at Evaluation unless otherwise noted.  DIAGNOSTIC FINDINGS: NA for this episode  COGNITION: Overall cognitive status: Within functional limits for tasks assessed   SENSATION: Light touch: WFL Sometimes reports pressure/heaviness in feet  MUSCLE TONE: WFL BLEs   POSTURE: rounded shoulders and forward head  LOWER EXTREMITY ROM:   ACTIVE ROM WFL  LOWER EXTREMITY MMT:    MMT Right Eval Left Eval  Hip flexion 3+ 3+  Hip extension    Hip abduction 4 4  Hip adduction 4 4  Hip internal rotation    Hip external rotation    Knee flexion 3+ 4  Knee extension 4 4  Ankle dorsiflexion 3+ 3+  Ankle plantarflexion    Ankle inversion    Ankle eversion    (Blank rows = not tested)    TRANSFERS: Assistive device utilized: Environmental consultant - 4 wheeled  Sit to stand: Modified independence Stand to sit: Modified independence   GAIT: Gait pattern: step through pattern, decreased step length- Right, decreased stride length,  and wide BOS Distance walked: 50 ft x 2 Assistive device utilized: Single point cane and Walker - 4 wheeled Level of assistance: SBA and CGA Comments: Pt brought in her cane (round tip base with SPC)  FUNCTIONAL TESTS:  5 times sit to stand: 13.63 sec arms crossed at chest Timed up and go (TUG): 17.37 with rollator; 20.91 sec with cane  10 meter walk test: 18.97 sec with rollator (1.73 ft/sec); 27.19 sec with cane (1.21 f/tsec) Berg Balance Scale: 32/56                                                                                                                                   TREATMENT DATE: 08/14/2023      GOALS: Goals reviewed with patient? Yes  SHORT TERM GOALS: Target date: 09/12/2023  Pt will be independent with HEP for improved strength, balance, gait. Baseline: reports "some days better than others" Goal status: MET 09/22/23  2.  Pt will improve 5x sit<>stand to less than or equal to 12 sec to demonstrate improved functional strength and transfer efficiency. Baseline:  13.63 sec; 14.49 sec without UE support 09/22/23 Goal status: NOT MET 09/22/23  3.  Pt will improve Berg score to at least 38/56 to decrease fall risk. Baseline: 32/56; 49/56 09/22/23 Goal status: MET 09/22/23  4.  Pt will improve gait velocity to at least 2 ft/sec with rollator for improved gait efficiency and safety. Baseline: 1.72 ft/sec; 2.1 ft/sec 09/22/23 Goal status: MET 09/22/23  LONG TERM GOALS: Target date: 10/10/2023>UPDATED TARGET 11/21/2023  Pt will be independent with progression of HEP for improved strength, balance, gait. Baseline:  Goal status: IN PROGRESS  10/10/23  2.  Pt will improve TUG score to less  than or equal to 13.5 sec for decreased fall risk. Baseline: 17.37 sec with rollator; >20 sec cane; 19.09 sec with cane 09/22/23>17 sec with cane 10/10/2023 Goal status: IN PROGRESS 10/10/2023  3.  Pt will improve Berg score to at least 45/56 to decrease fall risk. Baseline: 32/56; 49/56  09/22/23 Goal status: MET 09/22/23  4.  Pt will improve gait velocity to at least 1.8 ft/sec with cane for improved gait efficiency and safety.  Baseline: 1.58 ft/sec with cane 09/22/23, 1.75 ft/sec with cane 10/10/2023 Goal status: IN PROGRESS 10/10/23  5.  Pt will verbalize continued community fitness upon d/c from PT. Baseline: pt not performing but was educated on this 09/22/23 Goal status: IN PROGRESS 10/10/23  6. Pt will improve DGI score to at least 18/24 to decrease fall risk.  Baseline:  11/24  Goal status:  INITIAL 10/10/2023  7.  Pt will negotiate single curb step with cane and supervision for improved community mobility.  Baseline:  typically uses rollator and family assist  Goal status:  INITIAL, 10/10/2023  ASSESSMENT:  CLINICAL IMPRESSION: Patient arrived to session without complaints. Patient was able to perform gait training with can on uneven surfaces today; some remaining apprehension but no imbalance. Stability when walking with head turns/nods is improving as well. Plan to trial/simulate car transfers with walker in upcoming sessions to improve confidence in accessing community. Pt without complaints upon leaving.   OBJECTIVE IMPAIRMENTS: Abnormal gait, decreased balance, decreased mobility, difficulty walking, and decreased strength.   ACTIVITY LIMITATIONS: bending, standing, squatting, transfers, bed mobility, and locomotion level  PARTICIPATION LIMITATIONS: driving, shopping, and community activity  PERSONAL FACTORS: 3+ comorbidities: see above are also affecting patient's functional outcome.   REHAB POTENTIAL: Good  CLINICAL DECISION MAKING: Evolving/moderate complexity  EVALUATION COMPLEXITY: Moderate  PLAN:  PT FREQUENCY: 2x/week  PT DURATION: 6 weeks per recert 10/10/2023  PLANNED INTERVENTIONS: 97750- Physical Performance Testing, 97110-Therapeutic exercises, 97530- Therapeutic activity, 97112- Neuromuscular re-education, 97535- Self Care, 16109- Manual  therapy, [phone]- Gait training, Patient/Family education, and Balance training  PLAN FOR NEXT SESSION: asked pt to request that her ride keeps the car at clinic at next appointment to practice car transfers with 4WW or RW  Continue working on balance EC; continue working on gait with cane; curb/single step negotiation.   has shoulder discomfort with overhead reaching + full can   Thaddeus Filippo, PT, DPT 10/27/23 11:02 AM  [facility] at [facility] [address] [place], [address] [place], [place] [address] Phone # [phone] Fax # [phone]

## 2023-10-23 NOTE — Therapy (Signed)
 OUTPATIENT PHYSICAL THERAPY NEURO TREATMENT NOTE   Patient Name: Hayley Wu MRN: [id number] DOB:1951-04-04, 73 y.o., female Today's Date: 10/23/2023   PCP: Therman Flatter REFERRING PROVIDER: Ayesha Lente Lowell Rude, FNP       END OF SESSION:  PT End of Session - 10/23/23 1009     Visit Number 16    Number of Visits 26    Date for PT Re-Evaluation 11/21/23    Authorization Type [employer]    Progress Note Due on Visit 20    PT Start Time 1010    PT Stop Time 1100    PT Time Calculation (min) 50 min    Equipment Utilized During Treatment Gait belt    Activity Tolerance Patient tolerated treatment well    Behavior During Therapy WFL for tasks assessed/performed                       Past Medical History:  Diagnosis Date   Allergy    Contact lens/glasses fitting    Hypertension    Past Surgical History:  Procedure Laterality Date   ABDOMINAL HYSTERECTOMY     BREAST SURGERY  2010   rt lumpectomy-neg   IR IMAGING GUIDED PORT INSERTION  12/18/2022   LAMINECTOMY N/A 09/21/2022   Procedure: THORACIC LAMINECTOMY FOR TUMOR;  Surgeon: Isadora Mar, MD;  Location: [facility] OR;  Service: Neurosurgery;  Laterality: N/A;   OPEN REDUCTION INTERNAL FIXATION (ORIF) DISTAL RADIAL FRACTURE Left 11/26/2013   Procedure: OPEN REDUCTION INTERNAL FIXATION (ORIF) LEFT  DISTAL RADIUS ;  Surgeon: Ilean Mall, MD;  Location: [facility];  Service: Orthopedics;  Laterality: Left;  with block   TUBAL LIGATION     Patient Active Problem List   Diagnosis Date Noted   Acute deep vein thrombosis (DVT) of femoral vein of both lower extremities (HCC) 03/19/2023   Normocytic anemia 03/01/2023   Moderate protein malnutrition (HCC) 03/01/2023   Acute respiratory failure with hypoxia (HCC) 03/01/2023   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   Cardiomegaly 03/01/2023   Pleural effusion due to CHF (congestive heart failure) (HCC) 03/01/2023    Port-A-Cath in place 12/30/2022   Encounter for antineoplastic immunotherapy 11/18/2022   Encounter for antineoplastic chemotherapy 11/18/2022   Constipation 11/05/2022   Goals of care, counseling/discussion 10/21/2022   Cancer, metastatic to bone (HCC) 10/04/2022   Adenocarcinoma of right lung, stage 4 (HCC) 10/01/2022   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 09/20/2022   Hypokalemia 09/20/2022   Lung mass 09/20/2022   Hypothyroidism 09/20/2022   Cholelithiasis 09/20/2022   Lumbar spondylosis 08/26/2022   Thoracic back pain 04/04/2022   Low back pain 04/04/2022   Hypertension 09/23/2011    ONSET DATE: 08/08/2023 (MD order)  REFERRING DIAG:  Z91.81 (ICD-10-CM) - History of falling  C34.91 (ICD-10-CM) - Malignant neoplasm of unspecified part of right bronchus or lung    THERAPY DIAG:  Unsteadiness on feet  Other abnormalities of gait and mobility  Muscle weakness (generalized)  Other symptoms and signs involving the musculoskeletal system  Difficulty in walking, not elsewhere classified  Rationale for Evaluation and Treatment: Rehabilitation  SUBJECTIVE:  SUBJECTIVE STATEMENT: Doing well, no new issues. Tried driving with dtr a couple times but difficult to check blind spots bc of back limitation   Pt accompanied by: self  PERTINENT HISTORY: Stage IV (T1b, N2, M1 C) non-small cell lung cancer, adenocarcinoma presented with right lower lobe lung nodule in addition to small bilateral pulmonary nodules and right hilar and mediastinal lymphadenopathy in addition to liver and bone metastasis diagnosed in April 2024. She is status post decompressive thoracic  laminectomy of the T7 with spinal cord decompression and biopsy of the epidural mass in April 2024.    PAIN:  Are you having pain? Yes: NPRS scale: 3/10 Pain location: low back Pain description: sore Aggravating factors: bending, sitting wrong  Relieving factors: tylenol    PRECAUTIONS: Fall and Other: currently in treatment for lung cancer  RED FLAGS: None   WEIGHT BEARING RESTRICTIONS: No  FALLS: Has patient fallen in last 6 months? No  LIVING ENVIRONMENT: Lives with: lives alone Lives in: House/apartment Stairs: 1 step to get into home Has following equipment at home: Single point cane, Environmental consultant - 4 wheeled, Wheelchair (manual), and shower chair  PLOF: Independent with household mobility with device and Independent with community mobility with device  PATIENT GOALS: Still need to work on balance, endurance, and would like to work on cane more.  OBJECTIVE:    TODAY'S TREATMENT: 10/23/23 Activity Comments  LAQ 3x15 5#  Hip add iso 3x15   Seated march 3x15 5#  Standing alt stair taps 3x15 5#, 8" step w/ HR  HR post exercise 120 bpm   Seated hamstring curls 1x15 -10# -15# -15#  Gait training -trials w/ 8" step cane+HR -6" step w/ cane -obstacle courses w/ cane -multisensory static balance to improve postural stability with movements/sensory during gait     TODAY'S TREATMENT: 10/20/23 Activity Comments  Nustep L4 x 5 min LEs only  For LE ROM and strength  gait training without AD: straight ahead walking performing steps over beanbags Bending to pick up beanbags from floor  figure 8 turns Stair navigation (alt reciprocal with 1 and 2 UE support) CGA; occasional LOB with good self-correction  C/o LE fatigue when walking without device.   Some remaining lacking eccentric control when descending.   STS with 7# 2x10 Good form  gait training with cane: figure 8 turns Stair navigation (with and without UE support on rail) Bending to pick up beanbags from floor   Great sequencing, step length with turns                HOME EXERCISE PROGRAM: Access Code: [email or web address] URL: [email or web address] Date: 10/03/2023 Prepared by: [facility] - Outpatient  Rehab - [facility]  Program Notes practice walking inside the house with your cane for 5 minutes per day.   Exercises - Seated Hamstring Curls with Resistance  - 1 x daily - 5 x weekly - 3 sets - 10 reps - Seated Knee Extension with Resistance  - 1 x daily - 5 x weekly - 3 sets - 10 reps - Seated Hip Abduction with Resistance  - 1 x daily - 5 x weekly - 2 sets - 10 reps - Side Stepping with Counter Support  - 1 x daily - 5 x weekly - 2-3 sets - 10 reps - Alternating Step Taps with Counter Support  - 1 x daily - 5 x weekly - 3 sets - 10 reps - Backward Walking with Counter Support  - 1 x daily - 5 x weekly -  1 sets - 5 reps - Squat with Chair Touch  - 1 x daily - 5 x weekly - 2 sets - 10 reps - Corner Balance Feet Together With Eyes Open  - 1 x daily - 5 x weekly - 2 sets - 30 sec hold - Corner Balance Feet Apart: Eyes Open With Head Turns  - 1 x daily - 5 x weekly - 2 sets - 30 sec hold - Romberg Stance with Head Nods  - 1 x daily - 5 x weekly - 2 sets - 30 sec hold     Note: Objective measures were completed at Evaluation unless otherwise noted.  DIAGNOSTIC FINDINGS: NA for this episode  COGNITION: Overall cognitive status: Within functional limits for tasks assessed   SENSATION: Light touch: WFL Sometimes reports pressure/heaviness in feet  MUSCLE TONE: WFL BLEs   POSTURE: rounded shoulders and forward head  LOWER EXTREMITY ROM:   ACTIVE ROM WFL  LOWER EXTREMITY MMT:    MMT Right Eval Left Eval  Hip flexion 3+ 3+  Hip extension    Hip abduction 4 4  Hip adduction 4 4  Hip internal rotation    Hip external rotation    Knee flexion 3+ 4  Knee extension 4 4  Ankle dorsiflexion 3+ 3+  Ankle plantarflexion    Ankle inversion    Ankle eversion    (Blank  rows = not tested)    TRANSFERS: Assistive device utilized: Environmental consultant - 4 wheeled  Sit to stand: Modified independence Stand to sit: Modified independence   GAIT: Gait pattern: step through pattern, decreased step length- Right, decreased stride length, and wide BOS Distance walked: 50 ft x 2 Assistive device utilized: Single point cane and Walker - 4 wheeled Level of assistance: SBA and CGA Comments: Pt brought in her cane (round tip base with SPC)  FUNCTIONAL TESTS:  5 times sit to stand: 13.63 sec arms crossed at chest Timed up and go (TUG): 17.37 with rollator; 20.91 sec with cane  10 meter walk test: 18.97 sec with rollator (1.73 ft/sec); 27.19 sec with cane (1.21 f/tsec) Berg Balance Scale: 32/56                                                                                                                                   TREATMENT DATE: 08/14/2023      GOALS: Goals reviewed with patient? Yes  SHORT TERM GOALS: Target date: 09/12/2023  Pt will be independent with HEP for improved strength, balance, gait. Baseline: reports "some days better than others" Goal status: MET 09/22/23  2.  Pt will improve 5x sit<>stand to less than or equal to 12 sec to demonstrate improved functional strength and transfer efficiency. Baseline:  13.63 sec; 14.49 sec without UE support 09/22/23 Goal status: NOT MET 09/22/23  3.  Pt will improve Berg score to at least 38/56 to decrease fall risk. Baseline: 32/56; 49/56 09/22/23 Goal status:  MET 09/22/23  4.  Pt will improve gait velocity to at least 2 ft/sec with rollator for improved gait efficiency and safety. Baseline: 1.72 ft/sec; 2.1 ft/sec 09/22/23 Goal status: MET 09/22/23  LONG TERM GOALS: Target date: 10/10/2023>UPDATED TARGET 11/21/2023  Pt will be independent with progression of HEP for improved strength, balance, gait. Baseline:  Goal status: IN PROGRESS  10/10/23  2.  Pt will improve TUG score to less than or equal to 13.5 sec  for decreased fall risk. Baseline: 17.37 sec with rollator; >20 sec cane; 19.09 sec with cane 09/22/23>17 sec with cane 10/10/2023 Goal status: IN PROGRESS 10/10/2023  3.  Pt will improve Berg score to at least 45/56 to decrease fall risk. Baseline: 32/56; 49/56 09/22/23 Goal status: MET 09/22/23  4.  Pt will improve gait velocity to at least 1.8 ft/sec with cane for improved gait efficiency and safety.  Baseline: 1.58 ft/sec with cane 09/22/23, 1.75 ft/sec with cane 10/10/2023 Goal status: IN PROGRESS 10/10/23  5.  Pt will verbalize continued community fitness upon d/c from PT. Baseline: pt not performing but was educated on this 09/22/23 Goal status: IN PROGRESS 10/10/23  6. Pt will improve DGI score to at least 18/24 to decrease fall risk.  Baseline:  11/24  Goal status:  INITIAL 10/10/2023  7.  Pt will negotiate single curb step with cane and supervision for improved community mobility.  Baseline:  typically uses rollator and family assist  Goal status:  INITIAL, 10/10/2023  ASSESSMENT:  CLINICAL IMPRESSION: Endurance-based strength training to initiate session with good tolerance and fatigue/RPE 8/10 towards 15 rep-mark.  Gait training w/ cane and SBA-CGA to improve safety with ambulation and negotiation of stairs, curbs, obstacles with good stability demo throughout with no LOB but some difficulty/hesitancy when negotiating 8" curb/step reuqiring cane and HR support for safety. Continued sesisons to progess POC details to improve mobility and reduce risk for falls.   OBJECTIVE IMPAIRMENTS: Abnormal gait, decreased balance, decreased mobility, difficulty walking, and decreased strength.   ACTIVITY LIMITATIONS: bending, standing, squatting, transfers, bed mobility, and locomotion level  PARTICIPATION LIMITATIONS: driving, shopping, and community activity  PERSONAL FACTORS: 3+ comorbidities: see above are also affecting patient's functional outcome.   REHAB POTENTIAL: Good  CLINICAL DECISION  MAKING: Evolving/moderate complexity  EVALUATION COMPLEXITY: Moderate  PLAN:  PT FREQUENCY: 2x/week  PT DURATION: 6 weeks per recert 10/10/2023  PLANNED INTERVENTIONS: 97750- Physical Performance Testing, 97110-Therapeutic exercises, 97530- Therapeutic activity, 97112- Neuromuscular re-education, 97535- Self Care, 16109- Manual therapy, [phone]- Gait training, Patient/Family education, and Balance training  PLAN FOR NEXT SESSION: Continue working on balance EC; continue working on gait with cane; curb/single step negotiation.   has shoulder discomfort with overhead reaching + full can  11:00 AM, 10/23/23 M. Kelly Branna Cortina, PT, DPT Physical Therapist- [place] Office Number: [phone]

## 2023-10-27 ENCOUNTER — Ambulatory Visit: Admitting: Physical Therapy

## 2023-10-27 ENCOUNTER — Encounter: Payer: Self-pay | Admitting: Physical Therapy

## 2023-10-27 DIAGNOSIS — R262 Difficulty in walking, not elsewhere classified: Secondary | ICD-10-CM

## 2023-10-27 DIAGNOSIS — M6281 Muscle weakness (generalized): Secondary | ICD-10-CM

## 2023-10-27 DIAGNOSIS — R29898 Other symptoms and signs involving the musculoskeletal system: Secondary | ICD-10-CM

## 2023-10-27 DIAGNOSIS — R2681 Unsteadiness on feet: Secondary | ICD-10-CM

## 2023-10-27 DIAGNOSIS — R2689 Other abnormalities of gait and mobility: Secondary | ICD-10-CM

## 2023-10-29 ENCOUNTER — Inpatient Hospital Stay (HOSPITAL_BASED_OUTPATIENT_CLINIC_OR_DEPARTMENT_OTHER): Payer: PPO | Admitting: Internal Medicine

## 2023-10-29 ENCOUNTER — Inpatient Hospital Stay: Payer: PPO | Attending: Internal Medicine

## 2023-10-29 ENCOUNTER — Inpatient Hospital Stay: Payer: PPO

## 2023-10-29 VITALS — HR 93

## 2023-10-29 VITALS — BP 133/81 | HR 112 | Temp 98.6°F | Resp 16 | Ht 63.0 in | Wt 166.1 lb

## 2023-10-29 DIAGNOSIS — C787 Secondary malignant neoplasm of liver and intrahepatic bile duct: Secondary | ICD-10-CM | POA: Insufficient documentation

## 2023-10-29 DIAGNOSIS — Z5111 Encounter for antineoplastic chemotherapy: Secondary | ICD-10-CM | POA: Diagnosis not present

## 2023-10-29 DIAGNOSIS — C7951 Secondary malignant neoplasm of bone: Secondary | ICD-10-CM

## 2023-10-29 DIAGNOSIS — C3431 Malignant neoplasm of lower lobe, right bronchus or lung: Secondary | ICD-10-CM | POA: Insufficient documentation

## 2023-10-29 DIAGNOSIS — C3490 Malignant neoplasm of unspecified part of unspecified bronchus or lung: Secondary | ICD-10-CM

## 2023-10-29 DIAGNOSIS — C3491 Malignant neoplasm of unspecified part of right bronchus or lung: Secondary | ICD-10-CM

## 2023-10-29 DIAGNOSIS — Z95828 Presence of other vascular implants and grafts: Secondary | ICD-10-CM

## 2023-10-29 LAB — CMP (CANCER CENTER ONLY)
ALT: 13 U/L (ref 0–44)
AST: 18 U/L (ref 15–41)
Albumin: 4.2 g/dL (ref 3.5–5.0)
Alkaline Phosphatase: 89 U/L (ref 38–126)
Anion gap: 6 (ref 5–15)
BUN: 12 mg/dL (ref 8–23)
CO2: 29 mmol/L (ref 22–32)
Calcium: 9.4 mg/dL (ref 8.9–10.3)
Chloride: 108 mmol/L (ref 98–111)
Creatinine: 0.71 mg/dL (ref 0.44–1.00)
GFR, Estimated: >60 mL/min (ref >60)
Glucose, Bld: 137 mg/dL — ABNORMAL HIGH (ref 70–99)
Potassium: 3.6 mmol/L (ref 3.5–5.1)
Sodium: 143 mmol/L (ref 135–145)
Total Bilirubin: 0.5 mg/dL (ref 0.0–1.2)
Total Protein: 6.9 g/dL (ref 6.5–8.1)

## 2023-10-29 LAB — CBC WITH DIFFERENTIAL (CANCER CENTER ONLY)
Abs Immature Granulocytes: 0.02 10*3/uL (ref 0.00–0.07)
Basophils Absolute: 0 10*3/uL (ref 0.0–0.1)
Basophils Relative: 1 %
Eosinophils Absolute: 0.1 10*3/uL (ref 0.0–0.5)
Eosinophils Relative: 2 %
HCT: 40 % (ref 36.0–46.0)
Hemoglobin: 13.4 g/dL (ref 12.0–15.0)
Immature Granulocytes: 1 %
Lymphocytes Relative: 14 %
Lymphs Abs: 0.6 10*3/uL — ABNORMAL LOW (ref 0.7–4.0)
MCH: 30.2 pg (ref 26.0–34.0)
MCHC: 33.5 g/dL (ref 30.0–36.0)
MCV: 90.1 fL (ref 80.0–100.0)
Monocytes Absolute: 0.3 10*3/uL (ref 0.1–1.0)
Monocytes Relative: 8 %
Neutro Abs: 3.2 10*3/uL (ref 1.7–7.7)
Neutrophils Relative %: 74 %
Platelet Count: 195 10*3/uL (ref 150–400)
RBC: 4.44 MIL/uL (ref 3.87–5.11)
RDW: 13.3 % (ref 11.5–15.5)
WBC Count: 4.2 10*3/uL (ref 4.0–10.5)
nRBC: 0 % (ref 0.0–0.2)

## 2023-10-29 MED ORDER — SODIUM CHLORIDE 0.9% FLUSH
10.0000 mL | Freq: Once | INTRAVENOUS | Status: AC
  Administered 2023-10-29: 10 mL

## 2023-10-29 MED ORDER — SODIUM CHLORIDE 0.9% FLUSH
10.0000 mL | INTRAVENOUS | Status: DC | PRN
  Administered 2023-10-29: 10 mL

## 2023-10-29 MED ORDER — PROCHLORPERAZINE MALEATE 10 MG PO TABS
10.0000 mg | ORAL_TABLET | Freq: Once | ORAL | Status: AC
Start: 2023-10-29 — End: 2023-10-29
  Administered 2023-10-29: 10 mg via ORAL
  Filled 2023-10-29: qty 1

## 2023-10-29 MED ORDER — HEPARIN SOD (PORK) LOCK FLUSH 100 UNIT/ML IV SOLN
500.0000 [IU] | Freq: Once | INTRAVENOUS | Status: AC | PRN
  Administered 2023-10-29: 500 [IU]

## 2023-10-29 MED ORDER — SODIUM CHLORIDE 0.9 % IV SOLN: Freq: Once | INTRAVENOUS | Status: AC

## 2023-10-29 MED ORDER — DEXAMETHASONE SODIUM PHOSPHATE 10 MG/ML IJ SOLN
10.0000 mg | Freq: Once | INTRAMUSCULAR | Status: AC
  Administered 2023-10-29: 10 mg via INTRAVENOUS
  Filled 2023-10-29: qty 1

## 2023-10-29 MED ORDER — SODIUM CHLORIDE 0.9 % IV SOLN
500.0000 mg/m2 | Freq: Once | INTRAVENOUS | Status: AC
  Administered 2023-10-29: 900 mg via INTRAVENOUS
  Filled 2023-10-29: qty 20

## 2023-10-29 NOTE — Patient Instructions (Signed)
 [facility] - A DEPT OF [facility]  Discharge Instructions: Thank you for choosing [facility] to provide your oncology and hematology care.   If you have a lab appointment with the Cancer Center, please go directly to the Cancer Center and check in at the registration area.   Wear comfortable clothing and clothing appropriate for easy access to any Portacath or PICC line.   We strive to give you quality time with your provider. You may need to reschedule your appointment if you arrive late (15 or more minutes).  Arriving late affects you and other patients whose appointments are after yours.  Also, if you miss three or more appointments without notifying the office, you may be dismissed from the clinic at the provider's discretion.      For prescription refill requests, have your pharmacy contact our office and allow 72 hours for refills to be completed.    Today you received the following chemotherapy and/or immunotherapy agents Alimta      To help prevent nausea and vomiting after your treatment, we encourage you to take your nausea medication as directed.  BELOW ARE SYMPTOMS THAT SHOULD BE REPORTED IMMEDIATELY: *FEVER GREATER THAN 100.4 F (38 C) OR HIGHER *CHILLS OR SWEATING *NAUSEA AND VOMITING THAT IS NOT CONTROLLED WITH YOUR NAUSEA MEDICATION *UNUSUAL SHORTNESS OF BREATH *UNUSUAL BRUISING OR BLEEDING *URINARY PROBLEMS (pain or burning when urinating, or frequent urination) *BOWEL PROBLEMS (unusual diarrhea, constipation, pain near the anus) TENDERNESS IN MOUTH AND THROAT WITH OR WITHOUT PRESENCE OF ULCERS (sore throat, sores in mouth, or a toothache) UNUSUAL RASH, SWELLING OR PAIN  UNUSUAL VAGINAL DISCHARGE OR ITCHING   Items with * indicate a potential emergency and should be followed up as soon as possible or go to the Emergency Department if any problems should occur.  Please show the CHEMOTHERAPY ALERT CARD or IMMUNOTHERAPY  ALERT CARD at check-in to the Emergency Department and triage nurse.  Should you have questions after your visit or need to cancel or reschedule your appointment, please contact [facility] - A DEPT OF [facility]  Dept: [phone]  and follow the prompts.  Office hours are 8:00 a.m. to 4:30 p.m. Monday - Friday. Please note that voicemails left after 4:00 p.m. may not be returned until the following business day.  We are closed weekends and major holidays. You have access to a nurse at all times for urgent questions. Please call the main number to the clinic Dept: [phone] and follow the prompts.   For any non-urgent questions, you may also contact your provider using MyChart. We now offer e-Visits for anyone 79 and older to request care online for non-urgent symptoms. For details visit [email or web address].   Also download the MyChart app! Go to the app store, search "MyChart", open the app, select [name], and log in with your MyChart username and password.

## 2023-10-29 NOTE — Progress Notes (Signed)
 [facility] [phone]   [phone]  OFFICE PROGRESS NOTE  Stamey, Lowell Rude, FNP [address] [place] [address]  DIAGNOSIS: Stage IV (T1b, N2, M1 C) non-small cell lung cancer, adenocarcinoma presented with right lower lobe lung nodule in addition to small bilateral pulmonary nodules and right hilar and mediastinal lymphadenopathy in addition to liver and bone metastasis diagnosed in April 2024. 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   PDL1: 20%   Guardant 360: no actionable mutations.    PRIOR THERAPY: 1) Decompressive thoracic laminectomy of the T7 with spinal cord decompression and biopsy of the epidural mass in April 2024.  2) Palliative radiation to the bone under the care of Dr. Lurena Sally. Last day scheduled for 10/25/22    CURRENT THERAPY: Carboplatin  for an AUC of 5, Alimta 500 mg/m, Keytruda  200 mg IV every 3 weeks.  First dose expected on 10/28/2022. Status post 15 cycles.  Starting from cycle #5 the patient is on maintenance treatment with Alimta and Keytruda  every 3 weeks.  Starting from cycle #7 the patient will be treated with single agent Alimta 500 Mg/M2 every 3 weeks.  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  INTERVAL HISTORY: Hayley Wu 73 y.o. female returns to the clinic today for follow-up visit.  Discussed the use of AI scribe software for clinical note transcription with the patient, who gave verbal consent to proceed.  History of Present Illness   She is a 73 year old female undergoing chemotherapy who presents for cycle number sixteen of her treatment.  She is currently undergoing chemotherapy for Stage IV (T1b, N2, M1 C) non-small cell lung cancer, adenocarcinoma presented with right lower lobe lung nodule in addition to small bilateral pulmonary nodules and  right hilar and mediastinal lymphadenopathy in addition to liver and bone metastasis diagnosed in April 2024. 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 She is here for her sixteenth cycle of treatment with Alimta. No new complaints have arisen since her last visit three weeks ago.  No chest pain, breathing issues, nausea, or vomiting.       MEDICAL HISTORY: Past Medical History:  Diagnosis Date   Allergy    Contact lens/glasses fitting    Hypertension     ALLERGIES:  is allergic to erythromycin, amoxicillin, latex, and prednisone .  MEDICATIONS:  Current Outpatient Medications  Medication Sig Dispense Refill   acetaminophen  (TYLENOL ) 500 MG tablet Take 500 mg by mouth every 6 (six) hours as needed for moderate pain.     amLODipine  (NORVASC ) 10 MG tablet Take 10 mg by mouth daily.     apixaban  (ELIQUIS ) 5 MG TABS tablet Take 1 tablet (5 mg total) by mouth 2 (two) times daily. 60 tablet 2   folic acid  (FOLVITE ) 1 MG tablet Take 1 tablet (1 mg total) by mouth daily. 90 tablet 1   Ipratropium-Albuterol  (COMBIVENT ) 20-100 MCG/ACT AERS respimat Inhale 1 puff into the lungs every 6 (six) hours as needed for wheezing or shortness of breath. 4 g 1   levothyroxine  (SYNTHROID ) 112 MCG tablet Take 112 mcg by mouth daily before breakfast.     lidocaine -prilocaine  (EMLA ) cream Apply 1 Application topically as needed. 30 g 2   mirtazapine  (REMERON ) 30 MG tablet TAKE  1 TABLET BY MOUTH AT BEDTIME 90 tablet 0   potassium chloride  SA (KLOR-CON  M) 20 MEQ tablet Take 1 tablet (20 mEq total) by mouth daily. (Patient not taking: Reported on 08/14/2023) 6 tablet 0   prochlorperazine  (COMPAZINE ) 10 MG tablet Take 1 tablet (10 mg total) by mouth every 6 (six) hours as needed. (Patient taking differently: Take 10 mg by mouth every 6 (six) hours as needed for refractory nausea / vomiting.) 30 tablet 2   VITAMIN D PO  Take 1 tablet by mouth daily.     No current facility-administered medications for this visit.    SURGICAL HISTORY:  Past Surgical History:  Procedure Laterality Date   ABDOMINAL HYSTERECTOMY     BREAST SURGERY  2010   rt lumpectomy-neg   IR IMAGING GUIDED PORT INSERTION  12/18/2022   LAMINECTOMY N/A 09/21/2022   Procedure: THORACIC LAMINECTOMY FOR TUMOR;  Surgeon: Isadora Mar, MD;  Location: [facility] OR;  Service: Neurosurgery;  Laterality: N/A;   OPEN REDUCTION INTERNAL FIXATION (ORIF) DISTAL RADIAL FRACTURE Left 11/26/2013   Procedure: OPEN REDUCTION INTERNAL FIXATION (ORIF) LEFT  DISTAL RADIUS ;  Surgeon: Ilean Mall, MD;  Location: [facility];  Service: Orthopedics;  Laterality: Left;  with block   TUBAL LIGATION      REVIEW OF SYSTEMS:  A comprehensive review of systems was negative.   PHYSICAL EXAMINATION: General appearance: alert, cooperative, and no distress Head: Normocephalic, without obvious abnormality, atraumatic Neck: no adenopathy, no JVD, supple, symmetrical, trachea midline, and thyroid  not enlarged, symmetric, no tenderness/mass/nodules Lymph nodes: Cervical, supraclavicular, and axillary nodes normal. Resp: clear to auscultation bilaterally Back: symmetric, no curvature. ROM normal. No CVA tenderness. Cardio: regular rate and rhythm, S1, S2 normal, no murmur, click, rub or gallop GI: soft, non-tender; bowel sounds normal; no masses,  no organomegaly Extremities: extremities normal, atraumatic, no cyanosis or edema  ECOG PERFORMANCE STATUS: 1 - Symptomatic but completely ambulatory  Blood pressure 133/81, pulse (!) 112, temperature 98.6 F (37 C), temperature source Temporal, resp. rate 16, height 5\' 3"  (1.6 m), weight 166 lb 1.6 oz (75.3 kg), SpO2 97%.  LABORATORY DATA: Lab Results  Component Value Date   WBC 4.2 10/29/2023   HGB 13.4 10/29/2023   HCT 40.0 10/29/2023   MCV 90.1 10/29/2023   PLT 195 10/29/2023      Chemistry       Component Value Date/Time   NA 141 10/08/2023 1053   K 3.5 10/08/2023 1053   CL 105 10/08/2023 1053   CO2 28 10/08/2023 1053   BUN 12 10/08/2023 1053   CREATININE 0.67 10/08/2023 1053      Component Value Date/Time   CALCIUM 9.3 10/08/2023 1053   ALKPHOS 93 10/08/2023 1053   AST 17 10/08/2023 1053   ALT 13 10/08/2023 1053   BILITOT 0.7 10/08/2023 1053       RADIOGRAPHIC STUDIES: No results found.   ASSESSMENT AND PLAN: This is a very pleasant 73 years old white female with Stage IV (T1b, N2, M1 C) non-small cell lung cancer, adenocarcinoma presented with right lower lobe lung nodule in addition to small bilateral pulmonary nodules and right hilar and mediastinal lymphadenopathy in addition to liver and bone metastasis diagnosed in April 2024. 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 Molecular studies showed no actionable mutations and PD-L1 expression of 20%. The patient is status post Decompressive thoracic laminectomy of  the T7 with spinal cord decompression and biopsy of the epidural mass in April 2024.  She also had palliative radiation to the bone under the care of Dr. Lurena Sally. Last day scheduled for 10/25/22. She is currently on palliative systemic chemotherapy with carboplatin  for AUC of 5, Alimta 500 Mg/M2 and Keytruda  200 Mg IV every 3 weeks status post 15 cycles.  Starting from cycle #5 the patient is on maintenance treatment with Alimta and Keytruda  every 3 weeks.  After cycle #6 Keytruda  was discontinued secondary to suspicious immunotherapy mediated pneumonitis with recent hospitalization and acute respiratory failure. She continues to tolerate her treatment with single agent Alimta fairly well. Assessment and Plan    Stage IV (T1b, N2, M1 C) non-small cell lung cancer, adenocarcinoma presented with right lower lobe lung nodule in addition to small bilateral pulmonary nodules  and right hilar and mediastinal lymphadenopathy in addition to liver and bone metastasis diagnosed in April 2024. 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  Cancer under active treatment Undergoing active treatment with single agent Alimta for cancer, currently on cycle sixteen of chemotherapy. No new complaints such as chest pain, dyspnea, nausea, or emesis. Lab work is satisfactory for continuation of treatment. - Administer cycle sixteen of chemotherapy today - Schedule a scan 7 to 10 days before the next visit - Arrange additional chemotherapy appointments with the scheduler - Schedule follow-up visit in three weeks   She was advised to call immediately if she has any concerning symptoms in the interval. The patient voices understanding of current disease status and treatment options and is in agreement with the current care plan.  All questions were answered. The patient knows to call the clinic with any problems, questions or concerns. We can certainly see the patient much sooner if necessary.  The total time spent in the appointment was 20 minutes.  Disclaimer: This note was dictated with voice recognition software. Similar sounding words can inadvertently be transcribed and may not be corrected upon review.

## 2023-10-30 ENCOUNTER — Other Ambulatory Visit: Payer: Self-pay

## 2023-10-31 ENCOUNTER — Ambulatory Visit

## 2023-10-31 DIAGNOSIS — R262 Difficulty in walking, not elsewhere classified: Secondary | ICD-10-CM

## 2023-10-31 DIAGNOSIS — R29898 Other symptoms and signs involving the musculoskeletal system: Secondary | ICD-10-CM

## 2023-10-31 DIAGNOSIS — M6281 Muscle weakness (generalized): Secondary | ICD-10-CM

## 2023-10-31 DIAGNOSIS — R2681 Unsteadiness on feet: Secondary | ICD-10-CM

## 2023-10-31 DIAGNOSIS — R2689 Other abnormalities of gait and mobility: Secondary | ICD-10-CM

## 2023-10-31 NOTE — Therapy (Signed)
 OUTPATIENT PHYSICAL THERAPY NEURO TREATMENT NOTE   Patient Name: Hayley Wu MRN: [id number] DOB:10-Mar-1951, 73 y.o., female Today's Date: 10/31/2023   PCP: Therman Flatter REFERRING PROVIDER: Ayesha Lente Lowell Rude, FNP       END OF SESSION:  PT End of Session - 10/31/23 1016     Visit Number 18    Number of Visits 26    Date for PT Re-Evaluation 11/21/23    Authorization Type [employer]    Progress Note Due on Visit 20    PT Start Time 1015    PT Stop Time 1100    PT Time Calculation (min) 45 min    Equipment Utilized During Treatment Gait belt    Activity Tolerance Patient tolerated treatment well    Behavior During Therapy WFL for tasks assessed/performed                        Past Medical History:  Diagnosis Date   Allergy    Contact lens/glasses fitting    Hypertension    Past Surgical History:  Procedure Laterality Date   ABDOMINAL HYSTERECTOMY     BREAST SURGERY  2010   rt lumpectomy-neg   IR IMAGING GUIDED PORT INSERTION  12/18/2022   LAMINECTOMY N/A 09/21/2022   Procedure: THORACIC LAMINECTOMY FOR TUMOR;  Surgeon: Isadora Mar, MD;  Location: [facility] OR;  Service: Neurosurgery;  Laterality: N/A;   OPEN REDUCTION INTERNAL FIXATION (ORIF) DISTAL RADIAL FRACTURE Left 11/26/2013   Procedure: OPEN REDUCTION INTERNAL FIXATION (ORIF) LEFT  DISTAL RADIUS ;  Surgeon: Ilean Mall, MD;  Location: [facility];  Service: Orthopedics;  Laterality: Left;  with block   TUBAL LIGATION     Patient Active Problem List   Diagnosis Date Noted   Acute deep vein thrombosis (DVT) of femoral vein of both lower extremities (HCC) 03/19/2023   Normocytic anemia 03/01/2023   Moderate protein malnutrition (HCC) 03/01/2023   Acute respiratory failure with hypoxia (HCC) 03/01/2023   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   Cardiomegaly 03/01/2023   Pleural effusion due to CHF (congestive heart failure) (HCC) 03/01/2023    Port-A-Cath in place 12/30/2022   Encounter for antineoplastic immunotherapy 11/18/2022   Encounter for antineoplastic chemotherapy 11/18/2022   Constipation 11/05/2022   Goals of care, counseling/discussion 10/21/2022   Cancer, metastatic to bone (HCC) 10/04/2022   Adenocarcinoma of right lung, stage 4 (HCC) 10/01/2022   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 09/20/2022   Hypokalemia 09/20/2022   Lung mass 09/20/2022   Hypothyroidism 09/20/2022   Cholelithiasis 09/20/2022   Lumbar spondylosis 08/26/2022   Thoracic back pain 04/04/2022   Low back pain 04/04/2022   Hypertension 09/23/2011    ONSET DATE: 08/08/2023 (MD order)  REFERRING DIAG:  Z91.81 (ICD-10-CM) - History of falling  C34.91 (ICD-10-CM) - Malignant neoplasm of unspecified part of right bronchus or lung    THERAPY DIAG:  Unsteadiness on feet  Other abnormalities of gait and mobility  Muscle weakness (generalized)  Other symptoms and signs involving the musculoskeletal system  Difficulty in walking, not elsewhere classified  Rationale for Evaluation and Treatment: Rehabilitation  SUBJECTIVE:  SUBJECTIVE STATEMENT: It was a chemo week and feeling a bit "blah"  Pt accompanied by: self  PERTINENT HISTORY: Stage IV (T1b, N2, M1 C) non-small cell lung cancer, adenocarcinoma presented with right lower lobe lung nodule in addition to small bilateral pulmonary nodules and right hilar and mediastinal lymphadenopathy in addition to liver and bone metastasis diagnosed in April 2024. She is status post decompressive thoracic laminectomy of the T7 with spinal cord decompression and biopsy of the  epidural mass in April 2024.    PAIN:  Are you having pain? Yes: NPRS scale: 2/10 Pain location: low back Pain description: sore Aggravating factors: bending, sitting wrong  Relieving factors: tylenol    PRECAUTIONS: Fall and Other: currently in treatment for lung cancer  RED FLAGS: None   WEIGHT BEARING RESTRICTIONS: No  FALLS: Has patient fallen in last 6 months? No  LIVING ENVIRONMENT: Lives with: lives alone Lives in: House/apartment Stairs: 1 step to get into home Has following equipment at home: Single point cane, Environmental consultant - 4 wheeled, Wheelchair (manual), and shower chair  PLOF: Independent with household mobility with device and Independent with community mobility with device  PATIENT GOALS: Still need to work on balance, endurance, and would like to work on cane more.  OBJECTIVE:   TODAY'S TREATMENT: 10/31/23 Activity Comments  Floor to sit recovery SBA-CGA  Seated hamstring curls 1x10 10# 15# 20#  Forward and lateral step-ups 1x10 8" step, HR support  Retrowalk 2x45 ft   LAQ 4x10 5#  Seated march 3x10 5#       TODAY'S TREATMENT: 10/27/23 Activity Comments  gait training with cane on grass, up/down curbs, ramps  0.14 miles with CGA/SBA; pt slows down with changes in surface level but did not show instability. Required reminder of proper sequencing for curb navigation   gait + head turns/nods with cane, then without cane  Good stability with cane. Few episodes of imbalance but good ability to recover on her own  -bending to pick up cone from floor, placing it on overhead surface -walking to retreive beanbags from different locations in clinic, to toss into basket Without cane; good stability; CGA provided      PATIENT EDUCATION: Education details: discussion on pt's remaining challenges at home including vacuuming, driving and provided suggestions on making these activities safer Person educated: Patient Education method: Explanation Education  comprehension: verbalized understanding     HOME EXERCISE PROGRAM: Access Code: [email or web address] URL: [email or web address] Date: 10/03/2023 Prepared by: [facility] - Outpatient  Rehab - [facility]  Program Notes practice walking inside the house with your cane for 5 minutes per day.   Exercises - Seated Hamstring Curls with Resistance  - 1 x daily - 5 x weekly - 3 sets - 10 reps - Seated Knee Extension with Resistance  - 1 x daily - 5 x weekly - 3 sets - 10 reps - Seated Hip Abduction with Resistance  - 1 x daily - 5 x weekly - 2 sets - 10 reps - Side Stepping with Counter Support  - 1 x daily - 5 x weekly - 2-3 sets - 10 reps - Alternating Step Taps with Counter Support  - 1 x daily - 5 x weekly - 3 sets - 10 reps - Backward Walking with Counter Support  - 1 x daily - 5 x weekly - 1 sets - 5 reps - Squat with Chair Touch  - 1 x daily - 5 x weekly - 2 sets - 10 reps - Oncologist  Feet Together With Eyes Open  - 1 x daily - 5 x weekly - 2 sets - 30 sec hold - Corner Balance Feet Apart: Eyes Open With Head Turns  - 1 x daily - 5 x weekly - 2 sets - 30 sec hold - Romberg Stance with Head Nods  - 1 x daily - 5 x weekly - 2 sets - 30 sec hold     Note: Objective measures were completed at Evaluation unless otherwise noted.  DIAGNOSTIC FINDINGS: NA for this episode  COGNITION: Overall cognitive status: Within functional limits for tasks assessed   SENSATION: Light touch: WFL Sometimes reports pressure/heaviness in feet  MUSCLE TONE: WFL BLEs   POSTURE: rounded shoulders and forward head  LOWER EXTREMITY ROM:   ACTIVE ROM WFL  LOWER EXTREMITY MMT:    MMT Right Eval Left Eval  Hip flexion 3+ 3+  Hip extension    Hip abduction 4 4  Hip adduction 4 4  Hip internal rotation    Hip external rotation    Knee flexion 3+ 4  Knee extension 4 4  Ankle dorsiflexion 3+ 3+  Ankle plantarflexion    Ankle inversion    Ankle eversion    (Blank rows = not  tested)    TRANSFERS: Assistive device utilized: Environmental consultant - 4 wheeled  Sit to stand: Modified independence Stand to sit: Modified independence   GAIT: Gait pattern: step through pattern, decreased step length- Right, decreased stride length, and wide BOS Distance walked: 50 ft x 2 Assistive device utilized: Single point cane and Walker - 4 wheeled Level of assistance: SBA and CGA Comments: Pt brought in her cane (round tip base with SPC)  FUNCTIONAL TESTS:  5 times sit to stand: 13.63 sec arms crossed at chest Timed up and go (TUG): 17.37 with rollator; 20.91 sec with cane  10 meter walk test: 18.97 sec with rollator (1.73 ft/sec); 27.19 sec with cane (1.21 f/tsec) Berg Balance Scale: 32/56                                                                                                                                   TREATMENT DATE: 08/14/2023      GOALS: Goals reviewed with patient? Yes  SHORT TERM GOALS: Target date: 09/12/2023  Pt will be independent with HEP for improved strength, balance, gait. Baseline: reports "some days better than others" Goal status: MET 09/22/23  2.  Pt will improve 5x sit<>stand to less than or equal to 12 sec to demonstrate improved functional strength and transfer efficiency. Baseline:  13.63 sec; 14.49 sec without UE support 09/22/23 Goal status: NOT MET 09/22/23  3.  Pt will improve Berg score to at least 38/56 to decrease fall risk. Baseline: 32/56; 49/56 09/22/23 Goal status: MET 09/22/23  4.  Pt will improve gait velocity to at least 2 ft/sec with rollator for improved gait efficiency and safety. Baseline: 1.72 ft/sec; 2.1 ft/sec  09/22/23 Goal status: MET 09/22/23  LONG TERM GOALS: Target date: 10/10/2023>UPDATED TARGET 11/21/2023  Pt will be independent with progression of HEP for improved strength, balance, gait. Baseline:  Goal status: IN PROGRESS  10/10/23  2.  Pt will improve TUG score to less than or equal to 13.5 sec for decreased  fall risk. Baseline: 17.37 sec with rollator; >20 sec cane; 19.09 sec with cane 09/22/23>17 sec with cane 10/10/2023 Goal status: IN PROGRESS 10/10/2023  3.  Pt will improve Berg score to at least 45/56 to decrease fall risk. Baseline: 32/56; 49/56 09/22/23 Goal status: MET 09/22/23  4.  Pt will improve gait velocity to at least 1.8 ft/sec with cane for improved gait efficiency and safety.  Baseline: 1.58 ft/sec with cane 09/22/23, 1.75 ft/sec with cane 10/10/2023 Goal status: IN PROGRESS 10/10/23  5.  Pt will verbalize continued community fitness upon d/c from PT. Baseline: pt not performing but was educated on this 09/22/23 Goal status: IN PROGRESS 10/10/23  6. Pt will improve DGI score to at least 18/24 to decrease fall risk.  Baseline:  11/24  Goal status:  INITIAL 10/10/2023  7.  Pt will negotiate single curb step with cane and supervision for improved community mobility.  Baseline:  typically uses rollator and family assist  Goal status:  INITIAL, 10/10/2023  ASSESSMENT:  CLINICAL IMPRESSION: Training in mobility to improve floor to sit transfers with SBA-CGA for sequence from quadruped to kneeling and for push-off to sit EOM.  Step-ups to improve LE strength and sequence for car transfers into large vehicle requiring UE support.  Strength exercises to improve LE power/recruitment for improved stability during gait. Continued sessions to advance POC details to improve mobility and reduce risk for falls.   OBJECTIVE IMPAIRMENTS: Abnormal gait, decreased balance, decreased mobility, difficulty walking, and decreased strength.   ACTIVITY LIMITATIONS: bending, standing, squatting, transfers, bed mobility, and locomotion level  PARTICIPATION LIMITATIONS: driving, shopping, and community activity  PERSONAL FACTORS: 3+ comorbidities: see above are also affecting patient's functional outcome.   REHAB POTENTIAL: Good  CLINICAL DECISION MAKING: Evolving/moderate complexity  EVALUATION COMPLEXITY:  Moderate  PLAN:  PT FREQUENCY: 2x/week  PT DURATION: 6 weeks per recert 10/10/2023  PLANNED INTERVENTIONS: 97750- Physical Performance Testing, 97110-Therapeutic exercises, 97530- Therapeutic activity, 97112- Neuromuscular re-education, 97535- Self Care, 40981- Manual therapy, [phone]- Gait training, Patient/Family education, and Balance training  PLAN FOR NEXT SESSION: asked pt to request that her ride keeps the car at clinic at next appointment to practice car transfers with 4WW or RW  Continue working on balance EC; continue working on gait with cane; curb/single step negotiation.   has shoulder discomfort with overhead reaching + full can   11:10 AM, 10/31/23 M. Kelly Kaydie Petsch, PT, DPT Physical Therapist- [place] Office Number: [phone]

## 2023-11-05 ENCOUNTER — Encounter: Payer: Self-pay | Admitting: Internal Medicine

## 2023-11-05 ENCOUNTER — Encounter: Payer: Self-pay | Admitting: Physical Therapy

## 2023-11-05 ENCOUNTER — Ambulatory Visit: Admitting: Physical Therapy

## 2023-11-05 DIAGNOSIS — R2681 Unsteadiness on feet: Secondary | ICD-10-CM | POA: Diagnosis not present

## 2023-11-05 DIAGNOSIS — M6281 Muscle weakness (generalized): Secondary | ICD-10-CM

## 2023-11-05 DIAGNOSIS — R2689 Other abnormalities of gait and mobility: Secondary | ICD-10-CM

## 2023-11-05 NOTE — Therapy (Signed)
 OUTPATIENT PHYSICAL THERAPY NEURO TREATMENT NOTE   Patient Name: Hayley Wu MRN: [id number] DOB:[birth year], 73 y.o., female Today's Date: 11/05/2023   PCP: Therman Flatter REFERRING PROVIDER: Ayesha Lente Lowell Rude, FNP       END OF SESSION:  PT End of Session - 11/05/23 1409     Visit Number 19    Number of Visits 26    Date for PT Re-Evaluation 11/21/23    Authorization Type [employer]    Progress Note Due on Visit 20    PT Start Time 1408    PT Stop Time 1446    PT Time Calculation (min) 38 min    Equipment Utilized During Treatment Gait belt    Activity Tolerance Patient tolerated treatment well    Behavior During Therapy WFL for tasks assessed/performed                         Past Medical History:  Diagnosis Date   Allergy    Contact lens/glasses fitting    Hypertension    Past Surgical History:  Procedure Laterality Date   ABDOMINAL HYSTERECTOMY     BREAST SURGERY  2010   rt lumpectomy-neg   IR IMAGING GUIDED PORT INSERTION  12/18/2022   LAMINECTOMY N/A 09/21/2022   Procedure: THORACIC LAMINECTOMY FOR TUMOR;  Surgeon: Isadora Mar, MD;  Location: [facility] OR;  Service: Neurosurgery;  Laterality: N/A;   OPEN REDUCTION INTERNAL FIXATION (ORIF) DISTAL RADIAL FRACTURE Left 11/26/2013   Procedure: OPEN REDUCTION INTERNAL FIXATION (ORIF) LEFT  DISTAL RADIUS ;  Surgeon: Ilean Mall, MD;  Location: [facility];  Service: Orthopedics;  Laterality: Left;  with block   TUBAL LIGATION     Patient Active Problem List   Diagnosis Date Noted   Acute deep vein thrombosis (DVT) of femoral vein of both lower extremities (HCC) 03/19/2023   Normocytic anemia 03/01/2023   Moderate protein malnutrition (HCC) 03/01/2023   Acute respiratory failure with hypoxia (HCC) 03/01/2023   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   Cardiomegaly 03/01/2023   Pleural effusion due to CHF (congestive heart failure) (HCC) 03/01/2023    Port-A-Cath in place 12/30/2022   Encounter for antineoplastic immunotherapy 11/18/2022   Encounter for antineoplastic chemotherapy 11/18/2022   Constipation 11/05/2022   Goals of care, counseling/discussion 10/21/2022   Cancer, metastatic to bone (HCC) 10/04/2022   Adenocarcinoma of right lung, stage 4 (HCC) 10/01/2022   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 09/20/2022   Hypokalemia 09/20/2022   Lung mass 09/20/2022   Hypothyroidism 09/20/2022   Cholelithiasis 09/20/2022   Lumbar spondylosis 08/26/2022   Thoracic back pain 04/04/2022   Low back pain 04/04/2022   Hypertension 09/23/2011    ONSET DATE: 08/08/2023 (MD order)  REFERRING DIAG:  Z91.81 (ICD-10-CM) - History of falling  C34.91 (ICD-10-CM) - Malignant neoplasm of unspecified part of right bronchus or lung    THERAPY DIAG:  Unsteadiness on feet  Other abnormalities of gait and mobility  Muscle weakness (generalized)  Rationale for Evaluation and Treatment: Rehabilitation  SUBJECTIVE:  SUBJECTIVE STATEMENT: I may drive next week to therapy and so we can practice getting the walker into my car.    Pt accompanied by: self  PERTINENT HISTORY: Stage IV (T1b, N2, M1 C) non-small cell lung cancer, adenocarcinoma presented with right lower lobe lung nodule in addition to small bilateral pulmonary nodules and right hilar and mediastinal lymphadenopathy in addition to liver and bone metastasis diagnosed in April 2024. 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    PAIN:  Are you having pain? Yes: NPRS  scale: 2/10 Pain location: low back Pain description: sore Aggravating factors: bending, sitting wrong  Relieving factors: tylenol    PRECAUTIONS: Fall and Other: currently in treatment for lung cancer  RED FLAGS: None   WEIGHT BEARING RESTRICTIONS: No  FALLS: Has patient fallen in last 6 months? No  LIVING ENVIRONMENT: Lives with: lives alone Lives in: House/apartment Stairs: 1 step to get into home Has following equipment at home: Single point cane, Environmental consultant - 4 wheeled, Wheelchair (manual), and shower chair  PLOF: Independent with household mobility with device and Independent with community mobility with device  PATIENT GOALS: Still need to work on balance, endurance, and would like to work on cane more.  OBJECTIVE:    TODAY'S TREATMENT: 11/05/2023 Activity Comments  Seated march 3 x 10 5#, 2 sets with added coordinated UE motions  Seated LAQ, 3 x 10 Seated step out, in, 3 x 10 5#, Coordinated UE motions  Forward/back walking 40-45 ft, 2 reps with rollator 2 reps with cane supervision  Tandem gait  parallel bars x 2 Monster walk in parallel bars x 4 For hip stability, UE support  Forward/back walk in parallel bars EC, 3 reps UE support             PATIENT EDUCATION: Education details: Discussed options for continued community fitness-options include [employer] classes (post-cancer exercise class?), YouTube exercise for seniors videos Person educated: Patient Education method: Explanation Education comprehension: verbalized understanding     HOME EXERCISE PROGRAM: Access Code: [email or web address] URL: [email or web address] Date: 10/03/2023 Prepared by: [facility] - Outpatient  Rehab - [facility]  Program Notes practice walking inside the house with your cane for 5 minutes per day.   Exercises - Seated Hamstring Curls with Resistance  - 1 x daily - 5 x weekly - 3 sets - 10 reps - Seated Knee Extension with Resistance  - 1 x daily - 5 x weekly - 3  sets - 10 reps - Seated Hip Abduction with Resistance  - 1 x daily - 5 x weekly - 2 sets - 10 reps - Side Stepping with Counter Support  - 1 x daily - 5 x weekly - 2-3 sets - 10 reps - Alternating Step Taps with Counter Support  - 1 x daily - 5 x weekly - 3 sets - 10 reps - Backward Walking with Counter Support  - 1 x daily - 5 x weekly - 1 sets - 5 reps - Squat with Chair Touch  - 1 x daily - 5 x weekly - 2 sets - 10 reps - Corner Balance Feet Together With Eyes Open  - 1 x daily - 5 x weekly - 2 sets - 30 sec hold - Corner Balance Feet Apart: Eyes Open With Head Turns  - 1 x daily - 5 x weekly - 2 sets - 30 sec hold - Romberg Stance with Head  Nods  - 1 x daily - 5 x weekly - 2 sets - 30 sec hold     Note: Objective measures were completed at Evaluation unless otherwise noted.  DIAGNOSTIC FINDINGS: NA for this episode  COGNITION: Overall cognitive status: Within functional limits for tasks assessed   SENSATION: Light touch: WFL Sometimes reports pressure/heaviness in feet  MUSCLE TONE: WFL BLEs   POSTURE: rounded shoulders and forward head  LOWER EXTREMITY ROM:   ACTIVE ROM WFL  LOWER EXTREMITY MMT:    MMT Right Eval Left Eval  Hip flexion 3+ 3+  Hip extension    Hip abduction 4 4  Hip adduction 4 4  Hip internal rotation    Hip external rotation    Knee flexion 3+ 4  Knee extension 4 4  Ankle dorsiflexion 3+ 3+  Ankle plantarflexion    Ankle inversion    Ankle eversion    (Blank rows = not tested)    TRANSFERS: Assistive device utilized: Environmental consultant - 4 wheeled  Sit to stand: Modified independence Stand to sit: Modified independence   GAIT: Gait pattern: step through pattern, decreased step length- Right, decreased stride length, and wide BOS Distance walked: 50 ft x 2 Assistive device utilized: Single point cane and Walker - 4 wheeled Level of assistance: SBA and CGA Comments: Pt brought in her cane (round tip base with SPC)  FUNCTIONAL TESTS:  5 times  sit to stand: 13.63 sec arms crossed at chest Timed up and go (TUG): 17.37 with rollator; 20.91 sec with cane  10 meter walk test: 18.97 sec with rollator (1.73 ft/sec); 27.19 sec with cane (1.21 f/tsec) Berg Balance Scale: 32/56                                                                                                                                   TREATMENT DATE: 08/14/2023      GOALS: Goals reviewed with patient? Yes  SHORT TERM GOALS: Target date: 09/12/2023  Pt will be independent with HEP for improved strength, balance, gait. Baseline: reports "some days better than others" Goal status: MET 09/22/23  2.  Pt will improve 5x sit<>stand to less than or equal to 12 sec to demonstrate improved functional strength and transfer efficiency. Baseline:  13.63 sec; 14.49 sec without UE support 09/22/23 Goal status: NOT MET 09/22/23  3.  Pt will improve Berg score to at least 38/56 to decrease fall risk. Baseline: 32/56; 49/56 09/22/23 Goal status: MET 09/22/23  4.  Pt will improve gait velocity to at least 2 ft/sec with rollator for improved gait efficiency and safety. Baseline: 1.72 ft/sec; 2.1 ft/sec 09/22/23 Goal status: MET 09/22/23  LONG TERM GOALS: Target date: 10/10/2023>UPDATED TARGET 11/21/2023  Pt will be independent with progression of HEP for improved strength, balance, gait. Baseline:  Goal status: IN PROGRESS  10/10/23  2.  Pt will improve TUG score to less than or equal to 13.5 sec  for decreased fall risk. Baseline: 17.37 sec with rollator; >20 sec cane; 19.09 sec with cane 09/22/23>17 sec with cane 10/10/2023 Goal status: IN PROGRESS 10/10/2023  3.  Pt will improve Berg score to at least 45/56 to decrease fall risk. Baseline: 32/56; 49/56 09/22/23 Goal status: MET 09/22/23  4.  Pt will improve gait velocity to at least 1.8 ft/sec with cane for improved gait efficiency and safety.  Baseline: 1.58 ft/sec with cane 09/22/23, 1.75 ft/sec with cane 10/10/2023 Goal status: IN  PROGRESS 10/10/23  5.  Pt will verbalize continued community fitness upon d/c from PT. Baseline: pt not performing but was educated on this 09/22/23 Goal status: IN PROGRESS 10/10/23  6. Pt will improve DGI score to at least 18/24 to decrease fall risk.  Baseline:  11/24  Goal status:  INITIAL 10/10/2023  7.  Pt will negotiate single curb step with cane and supervision for improved community mobility.  Baseline:  typically uses rollator and family assist  Goal status:  INITIAL, 10/10/2023  ASSESSMENT:  CLINICAL IMPRESSION: Pt presents today with no new complaints; she would like to try to practice walker placement in/out of her car, next visit. Skilled PT session focused on lower extremity strength, dynamic balance; also discussed several options for continued fitness outside of therapy. Pt open to video/YouTube options and plans to look into this.  Pt will continue to benefit from skilled PT towards goals for improved functional mobility and decreased fall risk.   OBJECTIVE IMPAIRMENTS: Abnormal gait, decreased balance, decreased mobility, difficulty walking, and decreased strength.   ACTIVITY LIMITATIONS: bending, standing, squatting, transfers, bed mobility, and locomotion level  PARTICIPATION LIMITATIONS: driving, shopping, and community activity  PERSONAL FACTORS: 3+ comorbidities: see above are also affecting patient's functional outcome.   REHAB POTENTIAL: Good  CLINICAL DECISION MAKING: Evolving/moderate complexity  EVALUATION COMPLEXITY: Moderate  PLAN:  PT FREQUENCY: 2x/week  PT DURATION: 6 weeks per recert 10/10/2023  PLANNED INTERVENTIONS: 97750- Physical Performance Testing, 97110-Therapeutic exercises, 97530- Therapeutic activity, 97112- Neuromuscular re-education, 97535- Self Care, 16109- Manual therapy, [phone]- Gait training, Patient/Family education, and Balance training  PLAN FOR NEXT SESSION:Ask about YouTube videos, look into [employer]  asked pt to request that  her ride keeps the car at clinic at next appointment to practice car transfers with 4WW or RW  Continue working on balance EC; continue working on gait with cane; curb/single step negotiation.   has shoulder discomfort with overhead reaching + full can   Dessie Flow, PT 11/05/23 9:14 PM Phone: [phone] Fax: [phone]  [facility] at [facility] [address] [place], [address] [place], [place] [address] Phone # [phone] Fax # [phone]

## 2023-11-06 ENCOUNTER — Other Ambulatory Visit: Payer: Self-pay

## 2023-11-06 DIAGNOSIS — E032 Hypothyroidism due to medicaments and other exogenous substances: Secondary | ICD-10-CM

## 2023-11-11 ENCOUNTER — Encounter: Payer: Self-pay | Admitting: Physical Therapy

## 2023-11-11 ENCOUNTER — Ambulatory Visit: Attending: Family Medicine | Admitting: Physical Therapy

## 2023-11-11 DIAGNOSIS — R29898 Other symptoms and signs involving the musculoskeletal system: Secondary | ICD-10-CM | POA: Insufficient documentation

## 2023-11-11 DIAGNOSIS — R2681 Unsteadiness on feet: Secondary | ICD-10-CM | POA: Insufficient documentation

## 2023-11-11 DIAGNOSIS — R2689 Other abnormalities of gait and mobility: Secondary | ICD-10-CM | POA: Diagnosis not present

## 2023-11-11 DIAGNOSIS — M6281 Muscle weakness (generalized): Secondary | ICD-10-CM | POA: Diagnosis not present

## 2023-11-11 DIAGNOSIS — R262 Difficulty in walking, not elsewhere classified: Secondary | ICD-10-CM | POA: Insufficient documentation

## 2023-11-11 NOTE — Therapy (Signed)
 OUTPATIENT PHYSICAL THERAPY NEURO TREATMENT NOTE/10th Visit PROGRESS NOTE   Patient Name: Hayley Wu MRN: [id number] DOB:07/17/1950, 73 y.o., female Today's Date: 11/11/2023   PCP: Therman Flatter REFERRING PROVIDER: Lorenza Romans, FNP     Progress Note Reporting Period 09/22/2023 to 11/11/2023  See note below for Objective Data and Assessment of Progress/Goals.      END OF SESSION:  PT End of Session - 11/11/23 1021     Visit Number 20    Number of Visits 26    Date for PT Re-Evaluation 11/21/23    Authorization Type [employer]    Progress Note Due on Visit 20    PT Start Time 1020    PT Stop Time 1058    PT Time Calculation (min) 38 min    Equipment Utilized During Treatment Gait belt    Activity Tolerance Patient tolerated treatment well    Behavior During Therapy WFL for tasks assessed/performed                          Past Medical History:  Diagnosis Date   Allergy    Contact lens/glasses fitting    Hypertension    Past Surgical History:  Procedure Laterality Date   ABDOMINAL HYSTERECTOMY     BREAST SURGERY  2010   rt lumpectomy-neg   IR IMAGING GUIDED PORT INSERTION  12/18/2022   LAMINECTOMY N/A 09/21/2022   Procedure: THORACIC LAMINECTOMY FOR TUMOR;  Surgeon: Isadora Mar, MD;  Location: [facility] OR;  Service: Neurosurgery;  Laterality: N/A;   OPEN REDUCTION INTERNAL FIXATION (ORIF) DISTAL RADIAL FRACTURE Left 11/26/2013   Procedure: OPEN REDUCTION INTERNAL FIXATION (ORIF) LEFT  DISTAL RADIUS ;  Surgeon: Ilean Mall, MD;  Location: [facility];  Service: Orthopedics;  Laterality: Left;  with block   TUBAL LIGATION     Patient Active Problem List   Diagnosis Date Noted   Acute deep vein thrombosis (DVT) of femoral vein of both lower extremities (HCC) 03/19/2023   Normocytic anemia 03/01/2023   Moderate protein malnutrition (HCC) 03/01/2023   Acute respiratory failure with hypoxia (HCC) 03/01/2023    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   Cardiomegaly 03/01/2023   Pleural effusion due to CHF (congestive heart failure) (HCC) 03/01/2023   Port-A-Cath in place 12/30/2022   Encounter for antineoplastic immunotherapy 11/18/2022   Encounter for antineoplastic chemotherapy 11/18/2022   Constipation 11/05/2022   Goals of care, counseling/discussion 10/21/2022   Cancer, metastatic to bone (HCC) 10/04/2022   Adenocarcinoma of right lung, stage 4 (HCC) 10/01/2022   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 09/20/2022   Hypokalemia 09/20/2022   Lung mass 09/20/2022   Hypothyroidism 09/20/2022   Cholelithiasis 09/20/2022   Lumbar spondylosis 08/26/2022   Thoracic back pain 04/04/2022   Low back pain 04/04/2022   Hypertension 09/23/2011    ONSET DATE: 08/08/2023 (MD order)  REFERRING DIAG:  Z91.81 (ICD-10-CM) - History of falling  C34.91 (ICD-10-CM) - Malignant neoplasm of unspecified part of right bronchus or lung    THERAPY DIAG:  Unsteadiness on feet  Other abnormalities of gait and mobility  Muscle weakness (generalized)  Rationale for Evaluation and Treatment: Rehabilitation  SUBJECTIVE:  SUBJECTIVE STATEMENT: Don't feel as unsteady with the cane, so my balance is better. Did an exercise video on YouTube, and it was hard after about 10 minutes.   Pt accompanied by: self  PERTINENT HISTORY: Stage IV (T1b, N2, M1 C) non-small cell lung cancer, adenocarcinoma presented with right lower lobe lung nodule in addition to small bilateral pulmonary nodules and right hilar and mediastinal lymphadenopathy in addition to liver and bone metastasis diagnosed  in April 2024. 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    PAIN:  Are you having pain? Yes: NPRS scale: 2/10 Pain location: low back Pain description: sore Aggravating factors: bending, sitting wrong  Relieving factors: tylenol    PRECAUTIONS: Fall and Other: currently in treatment for lung cancer  RED FLAGS: None   WEIGHT BEARING RESTRICTIONS: No  FALLS: Has patient fallen in last 6 months? No  LIVING ENVIRONMENT: Lives with: lives alone Lives in: House/apartment Stairs: 1 step to get into home Has following equipment at home: Single point cane, Environmental consultant - 4 wheeled, Wheelchair (manual), and shower chair  PLOF: Independent with household mobility with device and Independent with community mobility with device  PATIENT GOALS: Still need to work on balance, endurance, and would like to work on cane more.  OBJECTIVE:    TODAY'S TREATMENT: 11/11/2023 Activity Comments  Gait training with cane x 2 minutes then forward/back walking with cane, 20 ft x 2   Quick  set switch forward/back directions with cane Good balance with staggered foot position  TUG 16.09 sec   10 M walk:  15.94 sec with cane:  2.05 ft/sec Improved from 1.75 ft/sec  Stagger stance forward/back rocking x 10 reps UE support  Stride stance position>forward/back step and weigthshift x 10 UE support  Squat  Sit to stand Stand trunk rotation 5 reps each, holding 8# weight, fatigues  Four square step activity clockwise and counterclockwise no device, 4 reps CGA and slowed pace to transition to backwards stepping  Step up x 5 reps>step negotiation, simulating curb, 4 reps with LUE support of rail/cane, min guard and cues for sequence            PATIENT EDUCATION: Education details: Progress towards goals, updates to HEP Person educated: Patient Education method: Explanation Education comprehension: verbalized  understanding     HOME EXERCISE PROGRAM: Access Code: [email or web address] URL: [email or web address] Date: 11/11/2023 Prepared by: [facility] - Outpatient  Rehab - [facility]  Program Notes practice walking inside the house with your cane for 5 minutes per day.   Exercises - Seated Hamstring Curls with Resistance  - 1 x daily - 5 x weekly - 3 sets - 10 reps - Seated Knee Extension with Resistance  - 1 x daily - 5 x weekly - 3 sets - 10 reps - Seated Hip Abduction with Resistance  - 1 x daily - 5 x weekly - 2 sets - 10 reps - Side Stepping with Counter Support  - 1 x daily - 5 x weekly - 2-3 sets - 10 reps - Alternating Step Taps with Counter Support  - 1 x daily - 5 x weekly - 3 sets - 10 reps - Backward Walking with Counter Support  - 1 x daily - 5 x weekly - 1 sets - 5 reps - Squat with Chair Touch  - 1 x daily - 5 x weekly - 2 sets - 10 reps - Corner  Balance Feet Together With Eyes Open  - 1 x daily - 5 x weekly - 2 sets - 30 sec hold - Corner Balance Feet Apart: Eyes Open With Head Turns  - 1 x daily - 5 x weekly - 2 sets - 30 sec hold - Romberg Stance with Head Nods  - 1 x daily - 5 x weekly - 2 sets - 30 sec hold - Stride Stance Weight Shift  - 1-2 x daily - 7 x weekly - 1-2 sets - 10 reps   Note: Objective measures were completed at Evaluation unless otherwise noted.  DIAGNOSTIC FINDINGS: NA for this episode  COGNITION: Overall cognitive status: Within functional limits for tasks assessed   SENSATION: Light touch: WFL Sometimes reports pressure/heaviness in feet  MUSCLE TONE: WFL BLEs   POSTURE: rounded shoulders and forward head  LOWER EXTREMITY ROM:   ACTIVE ROM WFL  LOWER EXTREMITY MMT:    MMT Right Eval Left Eval  Hip flexion 3+ 3+  Hip extension    Hip abduction 4 4  Hip adduction 4 4  Hip internal rotation    Hip external rotation    Knee flexion 3+ 4  Knee extension 4 4  Ankle dorsiflexion 3+ 3+  Ankle plantarflexion    Ankle inversion     Ankle eversion    (Blank rows = not tested)    TRANSFERS: Assistive device utilized: Environmental consultant - 4 wheeled  Sit to stand: Modified independence Stand to sit: Modified independence   GAIT: Gait pattern: step through pattern, decreased step length- Right, decreased stride length, and wide BOS Distance walked: 50 ft x 2 Assistive device utilized: Single point cane and Walker - 4 wheeled Level of assistance: SBA and CGA Comments: Pt brought in her cane (round tip base with SPC)  FUNCTIONAL TESTS:  5 times sit to stand: 13.63 sec arms crossed at chest Timed up and go (TUG): 17.37 with rollator; 20.91 sec with cane  10 meter walk test: 18.97 sec with rollator (1.73 ft/sec); 27.19 sec with cane (1.21 f/tsec) Berg Balance Scale: 32/56                                                                                                                                   TREATMENT DATE: 08/14/2023      GOALS: Goals reviewed with patient? Yes  SHORT TERM GOALS: Target date: 09/12/2023  Pt will be independent with HEP for improved strength, balance, gait. Baseline: reports "some days better than others" Goal status: MET 09/22/23  2.  Pt will improve 5x sit<>stand to less than or equal to 12 sec to demonstrate improved functional strength and transfer efficiency. Baseline:  13.63 sec; 14.49 sec without UE support 09/22/23 Goal status: NOT MET 09/22/23  3.  Pt will improve Berg score to at least 38/56 to decrease fall risk. Baseline: 32/56; 49/56 09/22/23 Goal status: MET 09/22/23  4.  Pt will improve gait  velocity to at least 2 ft/sec with rollator for improved gait efficiency and safety. Baseline: 1.72 ft/sec; 2.1 ft/sec 09/22/23 Goal status: MET 09/22/23  LONG TERM GOALS: Target date: 10/10/2023>UPDATED TARGET 11/21/2023  Pt will be independent with progression of HEP for improved strength, balance, gait. Baseline:  Goal status: IN PROGRESS  10/10/23  2.  Pt will improve TUG score to  less than or equal to 13.5 sec for decreased fall risk. Baseline: 17.37 sec with rollator; >20 sec cane; 19.09 sec with cane 09/22/23>17 sec with cane 10/10/2023 Goal status: IN PROGRESS 10/10/2023  3.  Pt will improve Berg score to at least 45/56 to decrease fall risk. Baseline: 32/56; 49/56 09/22/23 Goal status: MET 09/22/23  4.  Pt will improve gait velocity to at least 1.8 ft/sec with cane for improved gait efficiency and safety.  Baseline: 1.58 ft/sec with cane 09/22/23, 1.75 ft/sec with cane 10/10/2023; 2.05 ft/sec 11/11/2023 Goal status: MET 11/11/2023  5.  Pt will verbalize continued community fitness upon d/c from PT. Baseline: pt not performing but was educated on this 09/22/23 Goal status: IN PROGRESS 10/10/23  6. Pt will improve DGI score to at least 18/24 to decrease fall risk.  Baseline:  11/24  Goal status:  INITIAL 10/10/2023  7.  Pt will negotiate single curb step with cane and supervision for improved community mobility.  Baseline:  typically uses rollator and family assist  Goal status:  INITIAL, 10/10/2023  ASSESSMENT:  CLINICAL IMPRESSION: PROGRESS NOTE:  Pt presents today reporting improved confidence with cane. Objective measures:  gait velocity 2.05 ft/sec with cane, improved from 1.75 ft/sec and TUG 16 sec, improved from 17 sec.  She is progressing with improved mobility with the cane; she is continuing to use rollator for community gait.  Skilled PT session focused on dynamic balance and functional strength. Pt needs UE support and min guard for dynamic balance activities, stepping in posterior direction is slowed at times.  No overt LOB noted.  Pt is progressing towards LTGs and she will continue to benefit from skilled PT towards goals for improved functional mobility and decreased fall risk.   Pt presents today with no new complaints; she would like to try to practice walker placement in/out of her car, next visit. Skilled PT session focused on lower extremity strength, dynamic  balance; also discussed several options for continued fitness outside of therapy. Pt open to video/YouTube options and plans to look into this.  Pt will continue to benefit from skilled PT towards goals for improved functional mobility and decreased fall risk.   OBJECTIVE IMPAIRMENTS: Abnormal gait, decreased balance, decreased mobility, difficulty walking, and decreased strength.   ACTIVITY LIMITATIONS: bending, standing, squatting, transfers, bed mobility, and locomotion level  PARTICIPATION LIMITATIONS: driving, shopping, and community activity  PERSONAL FACTORS: 3+ comorbidities: see above are also affecting patient's functional outcome.   REHAB POTENTIAL: Good  CLINICAL DECISION MAKING: Evolving/moderate complexity  EVALUATION COMPLEXITY: Moderate  PLAN:  PT FREQUENCY: 2x/week  PT DURATION: 6 weeks per recert 10/10/2023  PLANNED INTERVENTIONS: 97750- Physical Performance Testing, 97110-Therapeutic exercises, 97530- Therapeutic activity, 97112- Neuromuscular re-education, 97535- Self Care, 16109- Manual therapy, [phone]- Gait training, Patient/Family education, and Balance training  PLAN FOR NEXT SESSION: Ask about YouTube videos, look into [facility]  asked pt to request that her ride keeps the car at clinic at next appointment to practice car transfers with 4WW or RW  Continue working on balance EC; continue working on gait with cane; curb/single step negotiation.   has shoulder  discomfort with overhead reaching + full can   Dessie Flow, PT 11/11/23 2:59 PM Phone: [phone] Fax: [phone]  [facility] at [facility] [address], [address] [place], [place] [address] Phone # [phone] Fax # [phone]

## 2023-11-13 ENCOUNTER — Ambulatory Visit

## 2023-11-13 ENCOUNTER — Ambulatory Visit (HOSPITAL_COMMUNITY)
Admission: RE | Admit: 2023-11-13 | Discharge: 2023-11-13 | Disposition: A | Discharge status: Home / Self Care | Source: Ambulatory Visit | Attending: Internal Medicine | Admitting: Internal Medicine

## 2023-11-13 ENCOUNTER — Other Ambulatory Visit: Payer: Self-pay | Admitting: Internal Medicine

## 2023-11-13 DIAGNOSIS — K8020 Calculus of gallbladder without cholecystitis without obstruction: Secondary | ICD-10-CM | POA: Diagnosis not present

## 2023-11-13 DIAGNOSIS — R2681 Unsteadiness on feet: Secondary | ICD-10-CM | POA: Diagnosis not present

## 2023-11-13 DIAGNOSIS — R2689 Other abnormalities of gait and mobility: Secondary | ICD-10-CM

## 2023-11-13 DIAGNOSIS — J90 Pleural effusion, not elsewhere classified: Secondary | ICD-10-CM | POA: Diagnosis not present

## 2023-11-13 DIAGNOSIS — C3490 Malignant neoplasm of unspecified part of unspecified bronchus or lung: Secondary | ICD-10-CM

## 2023-11-13 DIAGNOSIS — R262 Difficulty in walking, not elsewhere classified: Secondary | ICD-10-CM

## 2023-11-13 DIAGNOSIS — M6281 Muscle weakness (generalized): Secondary | ICD-10-CM

## 2023-11-13 DIAGNOSIS — R29898 Other symptoms and signs involving the musculoskeletal system: Secondary | ICD-10-CM

## 2023-11-13 MED ORDER — IOHEXOL 300 MG/ML  SOLN
100.0000 mL | Freq: Once | INTRAMUSCULAR | Status: AC | PRN
  Administered 2023-11-13: 100 mL via INTRAVENOUS

## 2023-11-13 MED ORDER — HEPARIN SOD (PORK) LOCK FLUSH 100 UNIT/ML IV SOLN
INTRAVENOUS | Status: AC
  Filled 2023-11-13: qty 5

## 2023-11-13 MED ORDER — HEPARIN SOD (PORK) LOCK FLUSH 100 UNIT/ML IV SOLN
500.0000 [IU] | Freq: Once | INTRAVENOUS | Status: AC
  Administered 2023-11-13: 500 [IU] via INTRAVENOUS

## 2023-11-13 NOTE — Therapy (Signed)
 OUTPATIENT PHYSICAL THERAPY NEURO TREATMENT    Patient Name: Hayley Wu MRN: [id number] DOB:08/05/1950, 73 y.o., female Today's Date: 11/13/2023   PCP: Therman Flatter REFERRING PROVIDER: Ayesha Lente Lowell Rude, FNP      END OF SESSION:  PT End of Session - 11/13/23 1023     Visit Number 21    Number of Visits 26    Date for PT Re-Evaluation 11/21/23    Authorization Type [employer]    Progress Note Due on Visit 20    PT Start Time 1015    PT Stop Time 1100    PT Time Calculation (min) 45 min    Equipment Utilized During Treatment Gait belt    Activity Tolerance Patient tolerated treatment well    Behavior During Therapy WFL for tasks assessed/performed                          Past Medical History:  Diagnosis Date   Allergy    Contact lens/glasses fitting    Hypertension    Past Surgical History:  Procedure Laterality Date   ABDOMINAL HYSTERECTOMY     BREAST SURGERY  2010   rt lumpectomy-neg   IR IMAGING GUIDED PORT INSERTION  12/18/2022   LAMINECTOMY N/A 09/21/2022   Procedure: THORACIC LAMINECTOMY FOR TUMOR;  Surgeon: Isadora Mar, MD;  Location: [facility] OR;  Service: Neurosurgery;  Laterality: N/A;   OPEN REDUCTION INTERNAL FIXATION (ORIF) DISTAL RADIAL FRACTURE Left 11/26/2013   Procedure: OPEN REDUCTION INTERNAL FIXATION (ORIF) LEFT  DISTAL RADIUS ;  Surgeon: Ilean Mall, MD;  Location: [facility];  Service: Orthopedics;  Laterality: Left;  with block   TUBAL LIGATION     Patient Active Problem List   Diagnosis Date Noted   Acute deep vein thrombosis (DVT) of femoral vein of both lower extremities (HCC) 03/19/2023   Normocytic anemia 03/01/2023   Moderate protein malnutrition (HCC) 03/01/2023   Acute respiratory failure with hypoxia (HCC) 03/01/2023   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   Cardiomegaly 03/01/2023   Pleural effusion due to CHF (congestive heart failure) (HCC) 03/01/2023    Port-A-Cath in place 12/30/2022   Encounter for antineoplastic immunotherapy 11/18/2022   Encounter for antineoplastic chemotherapy 11/18/2022   Constipation 11/05/2022   Goals of care, counseling/discussion 10/21/2022   Cancer, metastatic to bone (HCC) 10/04/2022   Adenocarcinoma of right lung, stage 4 (HCC) 10/01/2022   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 09/20/2022   Hypokalemia 09/20/2022   Lung mass 09/20/2022   Hypothyroidism 09/20/2022   Cholelithiasis 09/20/2022   Lumbar spondylosis 08/26/2022   Thoracic back pain 04/04/2022   Low back pain 04/04/2022   Hypertension 09/23/2011    ONSET DATE: 08/08/2023 (MD order)  REFERRING DIAG:  Z91.81 (ICD-10-CM) - History of falling  C34.91 (ICD-10-CM) - Malignant neoplasm of unspecified part of right bronchus or lung    THERAPY DIAG:  Unsteadiness on feet  Other abnormalities of gait and mobility  Muscle weakness (generalized)  Other symptoms and signs involving the musculoskeletal system  Difficulty in walking, not elsewhere classified  Rationale for Evaluation and Treatment: Rehabilitation  SUBJECTIVE:  SUBJECTIVE STATEMENT: Doing ok, tried getting rollator in/out of car and it was difficult   Pt accompanied by: self  PERTINENT HISTORY: Stage IV (T1b, N2, M1 C) non-small cell lung cancer, adenocarcinoma presented with right lower lobe lung nodule in addition to small bilateral pulmonary nodules and right hilar and mediastinal lymphadenopathy in addition to liver and bone metastasis diagnosed in April 2024. She is status post decompressive thoracic laminectomy of the T7 with spinal cord decompression  and biopsy of the epidural mass in April 2024.    PAIN:  Are you having pain? Yes: NPRS scale: 2/10 Pain location: low back Pain description: sore Aggravating factors: bending, sitting wrong  Relieving factors: tylenol    PRECAUTIONS: Fall and Other: currently in treatment for lung cancer  RED FLAGS: None   WEIGHT BEARING RESTRICTIONS: No  FALLS: Has patient fallen in last 6 months? No  LIVING ENVIRONMENT: Lives with: lives alone Lives in: House/apartment Stairs: 1 step to get into home Has following equipment at home: Single point cane, Environmental consultant - 4 wheeled, Wheelchair (manual), and shower chair  PLOF: Independent with household mobility with device and Independent with community mobility with device  PATIENT GOALS: Still need to work on balance, endurance, and would like to work on cane more.  OBJECTIVE:   TODAY'S TREATMENT: 11/13/23 Activity Comments  LAQ 3x15 5#  Seated march 3x15 5#  Alt toe taps 3x15 5#, 6" step for step height/hip flex strength  Curb/step training -up/down 6" curb w/ cane and HR progressing to cane and SBA -lateral step ups 6" 1x10 w/ HR -forward step up curb w/ 10# cable pulling back  Multisensory balance Foam: EO/EC x 30 sec, head turns EO/EC 3 reps. Alt toe taps on 6" step Rocker board x 60 sec ea direction Gastroc stretch on slantboard x 2 min        TODAY'S TREATMENT: 11/11/2023 Activity Comments  Gait training with cane x 2 minutes then forward/back walking with cane, 20 ft x 2   Quick  set switch forward/back directions with cane Good balance with staggered foot position  TUG 16.09 sec   10 M walk:  15.94 sec with cane:  2.05 ft/sec Improved from 1.75 ft/sec  Stagger stance forward/back rocking x 10 reps UE support  Stride stance position>forward/back step and weigthshift x 10 UE support  Squat  Sit to stand Stand trunk rotation 5 reps each, holding 8# weight, fatigues  Four square step activity clockwise and counterclockwise no device,  4 reps CGA and slowed pace to transition to backwards stepping  Step up x 5 reps>step negotiation, simulating curb, 4 reps with LUE support of rail/cane, min guard and cues for sequence            PATIENT EDUCATION: Education details: Progress towards goals, updates to HEP Person educated: Patient Education method: Explanation Education comprehension: verbalized understanding     HOME EXERCISE PROGRAM: Access Code: [email or web address] URL: [email or web address] Date: 11/11/2023 Prepared by: [facility] - [facility]  Program Notes practice walking inside the house with your cane for 5 minutes per day.   Exercises - Seated Hamstring Curls with Resistance  - 1 x daily - 5 x weekly - 3 sets - 10 reps - Seated Knee Extension with Resistance  - 1 x daily - 5 x weekly - 3 sets - 10 reps - Seated Hip Abduction with Resistance  - 1 x daily - 5 x weekly - 2 sets - 10 reps - Side Stepping  with Counter Support  - 1 x daily - 5 x weekly - 2-3 sets - 10 reps - Alternating Step Taps with Counter Support  - 1 x daily - 5 x weekly - 3 sets - 10 reps - Backward Walking with Counter Support  - 1 x daily - 5 x weekly - 1 sets - 5 reps - Squat with Chair Touch  - 1 x daily - 5 x weekly - 2 sets - 10 reps - Corner Balance Feet Together With Eyes Open  - 1 x daily - 5 x weekly - 2 sets - 30 sec hold - Corner Balance Feet Apart: Eyes Open With Head Turns  - 1 x daily - 5 x weekly - 2 sets - 30 sec hold - Romberg Stance with Head Nods  - 1 x daily - 5 x weekly - 2 sets - 30 sec hold - Stride Stance Weight Shift  - 1-2 x daily - 7 x weekly - 1-2 sets - 10 reps   Note: Objective measures were completed at Evaluation unless otherwise noted.  DIAGNOSTIC FINDINGS: NA for this episode  COGNITION: Overall cognitive status: Within functional limits for tasks assessed   SENSATION: Light touch: WFL Sometimes reports pressure/heaviness in feet  MUSCLE TONE: WFL  BLEs   POSTURE: rounded shoulders and forward head  LOWER EXTREMITY ROM:   ACTIVE ROM WFL  LOWER EXTREMITY MMT:    MMT Right Eval Left Eval  Hip flexion 3+ 3+  Hip extension    Hip abduction 4 4  Hip adduction 4 4  Hip internal rotation    Hip external rotation    Knee flexion 3+ 4  Knee extension 4 4  Ankle dorsiflexion 3+ 3+  Ankle plantarflexion    Ankle inversion    Ankle eversion    (Blank rows = not tested)    TRANSFERS: Assistive device utilized: Environmental consultant - 4 wheeled  Sit to stand: Modified independence Stand to sit: Modified independence   GAIT: Gait pattern: step through pattern, decreased step length- Right, decreased stride length, and wide BOS Distance walked: 50 ft x 2 Assistive device utilized: Single point cane and Walker - 4 wheeled Level of assistance: SBA and CGA Comments: Pt brought in her cane (round tip base with SPC)  FUNCTIONAL TESTS:  5 times sit to stand: 13.63 sec arms crossed at chest Timed up and go (TUG): 17.37 with rollator; 20.91 sec with cane  10 meter walk test: 18.97 sec with rollator (1.73 ft/sec); 27.19 sec with cane (1.21 f/tsec) Berg Balance Scale: 32/56                                                                                                                                   TREATMENT DATE: 08/14/2023      GOALS: Goals reviewed with patient? Yes  SHORT TERM GOALS: Target date: 09/12/2023  Pt will be independent with HEP for improved strength,  balance, gait. Baseline: reports "some days better than others" Goal status: MET 09/22/23  2.  Pt will improve 5x sit<>stand to less than or equal to 12 sec to demonstrate improved functional strength and transfer efficiency. Baseline:  13.63 sec; 14.49 sec without UE support 09/22/23 Goal status: NOT MET 09/22/23  3.  Pt will improve Berg score to at least 38/56 to decrease fall risk. Baseline: 32/56; 49/56 09/22/23 Goal status: MET 09/22/23  4.  Pt will improve gait  velocity to at least 2 ft/sec with rollator for improved gait efficiency and safety. Baseline: 1.72 ft/sec; 2.1 ft/sec 09/22/23 Goal status: MET 09/22/23  LONG TERM GOALS: Target date: 10/10/2023>UPDATED TARGET 11/21/2023  Pt will be independent with progression of HEP for improved strength, balance, gait. Baseline:  Goal status: IN PROGRESS  10/10/23  2.  Pt will improve TUG score to less than or equal to 13.5 sec for decreased fall risk. Baseline: 17.37 sec with rollator; >20 sec cane; 19.09 sec with cane 09/22/23>17 sec with cane 10/10/2023 Goal status: IN PROGRESS 10/10/2023  3.  Pt will improve Berg score to at least 45/56 to decrease fall risk. Baseline: 32/56; 49/56 09/22/23 Goal status: MET 09/22/23  4.  Pt will improve gait velocity to at least 1.8 ft/sec with cane for improved gait efficiency and safety.  Baseline: 1.58 ft/sec with cane 09/22/23, 1.75 ft/sec with cane 10/10/2023; 2.05 ft/sec 11/11/2023 Goal status: MET 11/11/2023  5.  Pt will verbalize continued community fitness upon d/c from PT. Baseline: pt not performing but was educated on this 09/22/23 Goal status: IN PROGRESS 10/10/23  6. Pt will improve DGI score to at least 18/24 to decrease fall risk.  Baseline:  11/24  Goal status:  INITIAL 10/10/2023  7.  Pt will negotiate single curb step with cane and supervision for improved community mobility.  Baseline:  typically uses rollator and family assist  Goal status:  INITIAL, 10/10/2023  ASSESSMENT:  CLINICAL IMPRESSION: Strength training to improve muscular endurance for improved activity tolreance. Gait training to increase independence and safety with curb negotiation w/ cane initially requiring BUE support progressed to cane and supervision for ascending/descending. Multisensory balance activities for improved balance and postural stability.  Good activity tolerance only requiring 2 seated rest periods throughout session. Continued sessions to progress POC details to improve mobility  and reduce risk for falls  OBJECTIVE IMPAIRMENTS: Abnormal gait, decreased balance, decreased mobility, difficulty walking, and decreased strength.   ACTIVITY LIMITATIONS: bending, standing, squatting, transfers, bed mobility, and locomotion level  PARTICIPATION LIMITATIONS: driving, shopping, and community activity  PERSONAL FACTORS: 3+ comorbidities: see above are also affecting patient's functional outcome.   REHAB POTENTIAL: Good  CLINICAL DECISION MAKING: Evolving/moderate complexity  EVALUATION COMPLEXITY: Moderate  PLAN:  PT FREQUENCY: 2x/week  PT DURATION: 6 weeks per recert 10/10/2023  PLANNED INTERVENTIONS: 97750- Physical Performance Testing, 97110-Therapeutic exercises, 97530- Therapeutic activity, 97112- Neuromuscular re-education, 97535- Self Care, 16109- Manual therapy, [phone]- Gait training, Patient/Family education, and Balance training  PLAN FOR NEXT SESSION: Ask about YouTube videos, look into [employer]/[facility]  asked pt to request that her ride keeps the car at clinic at next appointment to practice car transfers with 4WW or RW  Continue working on balance EC; continue working on gait with cane; curb/single step negotiation.   has shoulder discomfort with overhead reaching + full can   11:00 AM, 11/13/23 M. Kelly Lauria Depoy, PT, DPT Physical Therapist- [place] Office Number: [phone]

## 2023-11-14 NOTE — Progress Notes (Signed)
 [facility] OFFICE PROGRESS NOTE  Stamey, Lowell Rude, FNP [address] [place] [address]  DIAGNOSIS: Stage IV (T1b, N2, M1 C) non-small cell lung cancer, adenocarcinoma presented with right lower lobe lung nodule in addition to small bilateral pulmonary nodules and right hilar and mediastinal lymphadenopathy in addition to liver and bone metastasis diagnosed in April 2024. 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   PDL1: 20%   Guardant 360: no actionable mutations.  PRIOR THERAPY: 1) Decompressive thoracic laminectomy of the T7 with spinal cord decompression and biopsy of the epidural mass in April 2024.  2) Palliative radiation to the bone under the care of Dr. Lurena Sally. Last day scheduled for 10/25/22   CURRENT THERAPY: Carboplatin  for an AUC of 5, Alimta 500 mg/m, Keytruda  200 mg IV every 3 weeks.  First dose expected on 10/28/2022. Status post 16 cycles.  Starting from cycle #5 the patient is on maintenance treatment with Alimta and Keytruda  every 3 weeks.  Starting from cycle #7 the patient will be treated with single agent Alimta 500 Mg/M2 every 3 weeks.  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   INTERVAL HISTORY: Hayley Wu 73 y.o. female returns to the clinic today for a follow-up visit she was last seen by Dr. Marguerita Shih 3 weeks ago. She is currently on single agent Alimta. Keytruda  was discontinued due to suspicious immunotherapy mediated pneumonitis after a hospitalization in September 2024.   In the interval since last being seen, she denies any major changes in her health. She denies any appetite changes, fevers, or chills.  Her breathing is fine. She no longer requires supplemental oxygen  at night.  She is more active with walking. She participates in PT twice a week. She denies any significant cough.  Denies  any hemoptysis.  Denies any nausea, vomiting, diarrhea, or constipation.  Denies any headaches, vision changes, or falls. She has stable cholelithiasis with nchanged intra and extrahepatic biliary ductal dilatation but without ductal calculus or other obstructing etiology visible. She denies any new symptoms. She states every once in awhile she may have abdominal soreness that she rates a 2/10 that is self limiting. Her PCP is monitoring her thyroid  studies and she would like a copy of her thyroid  labs sent to her PCP when they result.  She recently had a restaging CT scan performed. She is here for evaluation and repeat blood work before undergoing cycle #16  MEDICAL HISTORY: Past Medical History:  Diagnosis Date   Allergy    Contact lens/glasses fitting    Hypertension     ALLERGIES:  is allergic to erythromycin, amoxicillin, latex, and prednisone .  MEDICATIONS:  Current Outpatient Medications  Medication Sig Dispense Refill   acetaminophen  (TYLENOL ) 500 MG tablet Take 500 mg by mouth every 6 (six) hours as needed for moderate pain.     amLODipine  (NORVASC ) 10 MG tablet Take 10 mg by mouth daily.     apixaban  (ELIQUIS ) 5 MG TABS tablet Take 1 tablet (5 mg total) by mouth 2 (two) times daily. 60 tablet 2   folic acid  (FOLVITE ) 1 MG tablet Take 1 tablet (1 mg total) by mouth daily. 90 tablet 1   Ipratropium-Albuterol  (COMBIVENT ) 20-100 MCG/ACT AERS respimat Inhale 1 puff into the lungs every 6 (six) hours as needed for wheezing or shortness of breath. 4 g 1   levothyroxine  (SYNTHROID ) 112  MCG tablet Take 112 mcg by mouth daily before breakfast.     lidocaine -prilocaine  (EMLA ) cream Apply 1 Application topically as needed. 30 g 2   mirtazapine  (REMERON ) 30 MG tablet TAKE 1 TABLET BY MOUTH AT BEDTIME 90 tablet 0   potassium chloride  SA (KLOR-CON  M) 20 MEQ tablet Take 1 tablet (20 mEq total) by mouth daily. (Patient not taking: Reported on 10/29/2023) 6 tablet 0   prochlorperazine  (COMPAZINE ) 10  MG tablet Take 1 tablet (10 mg total) by mouth every 6 (six) hours as needed. (Patient taking differently: Take 10 mg by mouth every 6 (six) hours as needed for refractory nausea / vomiting.) 30 tablet 2   VITAMIN D PO Take 1 tablet by mouth daily.     No current facility-administered medications for this visit.    SURGICAL HISTORY:  Past Surgical History:  Procedure Laterality Date   ABDOMINAL HYSTERECTOMY     BREAST SURGERY  2010   rt lumpectomy-neg   IR IMAGING GUIDED PORT INSERTION  12/18/2022   LAMINECTOMY N/A 09/21/2022   Procedure: THORACIC LAMINECTOMY FOR TUMOR;  Surgeon: Isadora Mar, MD;  Location: [facility] OR;  Service: Neurosurgery;  Laterality: N/A;   OPEN REDUCTION INTERNAL FIXATION (ORIF) DISTAL RADIAL FRACTURE Left 11/26/2013   Procedure: OPEN REDUCTION INTERNAL FIXATION (ORIF) LEFT  DISTAL RADIUS ;  Surgeon: Ilean Mall, MD;  Location: [facility];  Service: Orthopedics;  Laterality: Left;  with block   TUBAL LIGATION      REVIEW OF SYSTEMS:   Review of Systems  Constitutional: Negative for appetite change, chills, fatigue, fever and unexpected weight change.  HENT: Negative for mouth sores, nosebleeds, sore throat and trouble swallowing.   Eyes: Negative for eye problems and icterus.  Respiratory: Positive for stable dyspnea on exertion. Negative for cough, hemoptysis, and wheezing.     Cardiovascular: Negative for chest pain and swelling.  Gastrointestinal: Negative for abdominal pain, constipation, diarrhea, nausea and vomiting.  Genitourinary: Negative for bladder incontinence, difficulty urinating, dysuria, frequency and hematuria.   Musculoskeletal: Negative for back pain, gait problem, neck pain and neck stiffness.  Skin: Negative for itching and rash.  Neurological: Negative for dizziness, extremity weakness, gait problem, headaches, light-headedness and seizures.  Hematological: Negative for adenopathy. Does not bruise/bleed easily.   Psychiatric/Behavioral: Negative for confusion, depression and sleep disturbance. The patient is not nervous/anxious.       PHYSICAL EXAMINATION:  Blood pressure (!) 134/91, pulse (!) 114, temperature 98.5 F (36.9 C), resp. rate 18, weight 167 lb 9.6 oz (76 kg), SpO2 94%.  ECOG PERFORMANCE STATUS: 1  Physical Exam  Constitutional: Oriented to person, place, and time and well-developed, well-nourished, and in no distress. HENT:  Head: Normocephalic and atraumatic.  Mouth/Throat: Oropharynx is clear and moist. No oropharyngeal exudate.  Eyes: Conjunctivae are normal. Right eye exhibits no discharge. Left eye exhibits no discharge. No scleral icterus.  Neck: Normal range of motion. Neck supple.  Cardiovascular: Normal rate, regular rhythm, normal heart sounds and intact distal pulses.   Pulmonary/Chest: Effort normal and breath sounds normal. No respiratory distress. No wheezes. No rales.  Abdominal: Soft. Bowel sounds are normal. Exhibits no distension and no mass. There is no tenderness.  Musculoskeletal: Normal range of motion.  Lymphadenopathy:    No cervical adenopathy.  Neurological: Alert and oriented to person, place, and time. Exhibits muscle wasting. Examined in the wheelchair.  Skin: Skin is warm and dry. No rash noted. Not diaphoretic. No erythema. No pallor.  Psychiatric: Mood,  memory and judgment normal.  Vitals reviewed.    LABORATORY DATA: Lab Results  Component Value Date   WBC 5.0 11/19/2023   HGB 13.2 11/19/2023   HCT 39.8 11/19/2023   MCV 89.6 11/19/2023   PLT 217 11/19/2023      Chemistry      Component Value Date/Time   NA 143 10/29/2023 1056   K 3.6 10/29/2023 1056   CL 108 10/29/2023 1056   CO2 29 10/29/2023 1056   BUN 12 10/29/2023 1056   CREATININE 0.71 10/29/2023 1056      Component Value Date/Time   CALCIUM 9.4 10/29/2023 1056   ALKPHOS 89 10/29/2023 1056   AST 18 10/29/2023 1056   ALT 13 10/29/2023 1056   BILITOT 0.5 10/29/2023 1056        RADIOGRAPHIC STUDIES:  CT CHEST ABDOMEN PELVIS W CONTRAST Result Date: 11/13/2023 CLINICAL DATA:  Non-small cell lung cancer restaging * Tracking Code: BO * EXAM: CT CHEST, ABDOMEN, AND PELVIS WITH CONTRAST TECHNIQUE: Multidetector CT imaging of the chest, abdomen and pelvis was performed following the standard protocol during bolus administration of intravenous contrast. RADIATION DOSE REDUCTION: This exam was performed according to the departmental dose-optimization program which includes automated exposure control, adjustment of the mA and/or kV according to patient size and/or use of iterative reconstruction technique. CONTRAST:  OMNIPAQUE  IOHEXOL  300 MG/ML  SOLN COMPARISON:  08/20/2023 FINDINGS: CT CHEST FINDINGS Cardiovascular: Right chest port catheter. Aortic atherosclerosis. Normal heart size. Left and right coronary artery calcifications. Unchanged moderate pericardial effusion. Mediastinum/Nodes: No enlarged mediastinal, hilar, or axillary lymph nodes. Thyroid  gland, trachea, and esophagus demonstrate no significant findings. Lungs/Pleura: Moderate centrilobular and paraseptal emphysema. Unchanged, bandlike paramedian scarring and fibrotic volume loss, more conspicuous in the right lung (series 7, image 53). Unchanged spiculated nodule of the right lower lobe measuring 1.4 x 1.1 cm (series 7, image 71). Unchanged spiculated nodule of the right upper lobe measuring 0.7 cm (series 7, image 30). Similar small, loculated right pleural effusion. Musculoskeletal: No chest wall abnormality. No acute osseous findings. CT ABDOMEN PELVIS FINDINGS Hepatobiliary: No solid liver abnormality is seen. Multiple cysts, particularly in the left lobe of the liver, benign, requiring no further follow-up or characterization (series 2, image 43). Large, faintly calcified gallstone. No gallbladder wall thickening. Unchanged intra and extrahepatic biliary ductal dilatation, common hepatic duct measuring up to  1.4 cm (series 2, image 54). Pancreas: Unremarkable. No pancreatic ductal dilatation or surrounding inflammatory changes. Spleen: Normal in size without significant abnormality. Adrenals/Urinary Tract: Adrenal glands are unremarkable. Kidneys are normal, without renal calculi, solid lesion, or hydronephrosis. Bladder is unremarkable. Stomach/Bowel: Stomach is within normal limits. Appendix appears normal. No evidence of bowel wall thickening, distention, or inflammatory changes. Sigmoid diverticulosis. Vascular/Lymphatic: Aortic atherosclerosis. No enlarged abdominal or pelvic lymph nodes. Reproductive: Status post hysterectomy. Other: No abdominal wall hernia or abnormality. No ascites. Musculoskeletal: No acute osseous findings. 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 IMPRESSION: 1. Unchanged post treatment/post radiation appearance of the chest. Unchanged spiculated right upper and right lower lobe pulmonary nodules. Similar small, loculated right pleural effusion. 2. 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 3. No evidence of lymphadenopathy or soft tissue metastatic disease in the abdomen or  pelvis. 4. Unchanged moderate pericardial effusion. 5. Cholelithiasis with unchanged intra and extrahepatic biliary ductal dilatation but without ductal calculus or other obstructing etiology visible. Correlate for clinical evidence of biliary obstruction. 6. Emphysema. 7. Coronary artery disease. Aortic Atherosclerosis (ICD10-I70.0) and Emphysema (ICD10-J43.9). Electronically Signed   By: Fredricka Jenny M.D.   On: 11/13/2023 14:18      ASSESSMENT/PLAN:  This is a very pleasant 73 year old Caucasian female recently diagnosed with stage IV (T1b, N2, M1 C) non-small cell lung cancer, adenocarcinoma presented with right lower lobe lung nodule in addition to small bilateral pulmonary nodules and right hilar and mediastinal lymphadenopathy in addition to liver and bone metastasis diagnosed in April 2024. 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 Her PDL1 expression is 20%. She has no actionable mutations.    She is completed palliative radiation to the painful metastatic bone lesions under the care of Dr. Lurena Sally in the last day radiation was on 10/25/2022.    She is currently undergoing systemic chemotherapy with carboplatin  for an AUC of 5, Alimta 500 mg/m, and Keytruda  200 mg IV every 3 weeks. She is status post 16 cycles.  Starting from cycle #5 she was on maintenance treatment with Alimta and Keytruda .  Starting after cycle #6 Keytruda  was discontinued from the care plan due to suspicious immunotherapy mediated pneumonitis.  Therefore, she is currently undergoing single agent chemotherapy with Alimta IV every 3 weeks.  The patient was seen with Dr. Marguerita Shih today.  Dr. Marguerita Shih personally and independently reviewed the scan and discussed results with the patient today.  The scan showed no evidence of disease progression.  Dr. Marguerita Shih recommends her continue on the same treatment at the same dose.   Labs were reviewed. Recommend she proceed with cycle #17 today as scheduled.   We will see her back for follow-up visit in 3 weeks for evaluation and repeat blood work before undergoing cycle #18  I let her know we are happy to send her TSH and T4 to her PCP once resulted.    The patient was advised to call immediately if she has any concerning symptoms in the interval. The patient voices understanding of current disease status and  treatment options and is in agreement with the current care plan. All questions were answered. The patient knows to call the clinic with any problems, questions or concerns. We can certainly see the patient much sooner if necessary   No orders of the defined types were placed in this encounter.    Hayley Latouche L Tavari Loadholt, PA-C 11/19/23  ADDENDUM: Hematology/Oncology Attending: I had a face-to-face encounter with the patient today.  I reviewed her records, lab, scan and recommended her care plan.  This is a very pleasant 73 years old white female with stage IV non-small cell lung cancer, adenocarcinoma diagnosed in April 2024.  She has no actionable mutations and PD-L1 expression of 20%.  The patient underwent decompressive thoracic laminectomy of the T7 with a spinal cord decompression and biopsy of epidural mass in April 2024 followed by palliative radiation to this area.  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 The  patient has been tolerating her treatment with Alimta fairly well with no significant adverse effects. She had repeat CT scan of the chest, abdomen and pelvis performed recently.  I personally and independently reviewed the scan and discussed the result with the patient today.  Her scan showed no concerning findings for disease progression. I recommended for her to continue her current treatment with Alimta and she will proceed with cycle #17 today. The patient was advised to call immediately if she has any other concerning symptoms in the interval. The total time spent in the appointment was 30 minutes including review of chart and various tests results,  discussions about plan of care and coordination of care plan . Disclaimer: This note was dictated with voice recognition software. Similar sounding words can inadvertently be transcribed and may be missed upon review. Aurelio Blower, MD

## 2023-11-18 ENCOUNTER — Encounter: Payer: Self-pay | Admitting: Physical Therapy

## 2023-11-18 ENCOUNTER — Ambulatory Visit: Admitting: Physical Therapy

## 2023-11-18 ENCOUNTER — Other Ambulatory Visit: Payer: Self-pay

## 2023-11-18 DIAGNOSIS — R2681 Unsteadiness on feet: Secondary | ICD-10-CM

## 2023-11-18 DIAGNOSIS — M6281 Muscle weakness (generalized): Secondary | ICD-10-CM

## 2023-11-18 DIAGNOSIS — R2689 Other abnormalities of gait and mobility: Secondary | ICD-10-CM

## 2023-11-18 NOTE — Therapy (Signed)
 OUTPATIENT PHYSICAL THERAPY NEURO TREATMENT    Patient Name: Hayley Wu MRN: [id number] DOB:01-23-1951, 73 y.o., female Today's Date: 11/18/2023   PCP: Therman Flatter REFERRING PROVIDER: Ayesha Lente Lowell Rude, FNP      END OF SESSION:  PT End of Session - 11/18/23 1323     Visit Number 22    Number of Visits 26    Date for PT Re-Evaluation 11/21/23    Authorization Type [employer]    Progress Note Due on Visit 20    PT Start Time 1320    PT Stop Time 1400    PT Time Calculation (min) 40 min    Equipment Utilized During Treatment Gait belt    Activity Tolerance Patient tolerated treatment well    Behavior During Therapy WFL for tasks assessed/performed                           Past Medical History:  Diagnosis Date   Allergy    Contact lens/glasses fitting    Hypertension    Past Surgical History:  Procedure Laterality Date   ABDOMINAL HYSTERECTOMY     BREAST SURGERY  2010   rt lumpectomy-neg   IR IMAGING GUIDED PORT INSERTION  12/18/2022   LAMINECTOMY N/A 09/21/2022   Procedure: THORACIC LAMINECTOMY FOR TUMOR;  Surgeon: Isadora Mar, MD;  Location: [facility] OR;  Service: Neurosurgery;  Laterality: N/A;   OPEN REDUCTION INTERNAL FIXATION (ORIF) DISTAL RADIAL FRACTURE Left 11/26/2013   Procedure: OPEN REDUCTION INTERNAL FIXATION (ORIF) LEFT  DISTAL RADIUS ;  Surgeon: Ilean Mall, MD;  Location: [facility];  Service: Orthopedics;  Laterality: Left;  with block   TUBAL LIGATION     Patient Active Problem List   Diagnosis Date Noted   Acute deep vein thrombosis (DVT) of femoral vein of both lower extremities (HCC) 03/19/2023   Normocytic anemia 03/01/2023   Moderate protein malnutrition (HCC) 03/01/2023   Acute respiratory failure with hypoxia (HCC) 03/01/2023   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   Cardiomegaly 03/01/2023   Pleural effusion due to CHF (congestive heart failure) (HCC) 03/01/2023    Port-A-Cath in place 12/30/2022   Encounter for antineoplastic immunotherapy 11/18/2022   Encounter for antineoplastic chemotherapy 11/18/2022   Constipation 11/05/2022   Goals of care, counseling/discussion 10/21/2022   Cancer, metastatic to bone (HCC) 10/04/2022   Adenocarcinoma of right lung, stage 4 (HCC) 10/01/2022   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 09/20/2022   Hypokalemia 09/20/2022   Lung mass 09/20/2022   Hypothyroidism 09/20/2022   Cholelithiasis 09/20/2022   Lumbar spondylosis 08/26/2022   Thoracic back pain 04/04/2022   Low back pain 04/04/2022   Hypertension 09/23/2011    ONSET DATE: 08/08/2023 (MD order)  REFERRING DIAG:  Z91.81 (ICD-10-CM) - History of falling  C34.91 (ICD-10-CM) - Malignant neoplasm of unspecified part of right bronchus or lung    THERAPY DIAG:  Unsteadiness on feet  Muscle weakness (generalized)  Other abnormalities of gait and mobility  Rationale for Evaluation and Treatment: Rehabilitation  SUBJECTIVE:  SUBJECTIVE STATEMENT: Having a little pain in low back today; think the weather is playing a role.  Pt accompanied by: self  PERTINENT HISTORY: Stage IV (T1b, N2, M1 C) non-small cell lung cancer, adenocarcinoma presented with right lower lobe lung nodule in addition to small bilateral pulmonary nodules and right hilar and mediastinal lymphadenopathy in addition to liver and bone metastasis diagnosed in April 2024. 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    PAIN:  Are you having pain? Yes: NPRS scale:  3/10 Pain location: low back Pain description: sore Aggravating factors: bending, sitting wrong  Relieving factors: tylenol    PRECAUTIONS: Fall and Other: currently in treatment for lung cancer  RED FLAGS: None   WEIGHT BEARING RESTRICTIONS: No  FALLS: Has patient fallen in last 6 months? No  LIVING ENVIRONMENT: Lives with: lives alone Lives in: House/apartment Stairs: 1 step to get into home Has following equipment at home: Single point cane, Environmental consultant - 4 wheeled, Wheelchair (manual), and shower chair  PLOF: Independent with household mobility with device and Independent with community mobility with device  PATIENT GOALS: Still need to work on balance, endurance, and would like to work on cane more.  OBJECTIVE:    TODAY'S TREATMENT: 11/18/2023 Activity Comments  NuStep, Level 5>Level 3, 4 extremities x 6 minutes SPM >60, for lower extremity strengthening, flexibility  Gait with cane forward 2 minutes, then forward/back x 2 minutes Min guard  Forward step ups and step downs , simulating curb step 6", progressing from cane and handrail to cane, min guard   Standing gastroc stretch, foot propped at step, 2 x 15 sec   Side step ups x 10 Forward step ups x 10 6" step, 1 UE support  Single limb stance:  foot propped on 6" step with head turns, head nods, then with EC Min guard throughout  Pt reports fatigue, tightness in outer hip area, tenderness to palpation at greater trochanter and along IT band Performed IT band stretch in sitting, 2 x 15 sec   HOME EXERCISE PROGRAM: Access Code: [email or web address] URL: [email or web address] Date: 11/18/2023 Prepared by: [facility] - Outpatient  Rehab - [facility]  Program Notes practice walking inside the house with your cane for 5 minutes per day.   Exercises - Seated Hamstring Curls with Resistance  - 1 x daily - 5 x weekly - 3 sets - 10 reps - Seated Knee Extension with Resistance  - 1 x daily - 5 x weekly - 3 sets - 10  reps - Seated Hip Abduction with Resistance  - 1 x daily - 5 x weekly - 2 sets - 10 reps - Side Stepping with Counter Support  - 1 x daily - 5 x weekly - 2-3 sets - 10 reps - Alternating Step Taps with Counter Support  - 1 x daily - 5 x weekly - 3 sets - 10 reps - Backward Walking with Counter Support  - 1 x daily - 5 x weekly - 1 sets - 5 reps - Squat with Chair Touch  - 1 x daily - 5 x weekly - 2 sets - 10 reps - Corner Balance Feet Together With Eyes Open  - 1 x daily - 5 x weekly - 2 sets - 30 sec hold - Corner Balance Feet Apart: Eyes Open With Head Turns  - 1 x daily - 5 x weekly - 2 sets - 30 sec  hold - Romberg Stance with Head Nods  - 1 x daily - 5 x weekly - 2 sets - 30 sec hold - Stride Stance Weight Shift  - 1-2 x daily - 7 x weekly - 1-2 sets - 10 reps - Standing Gastroc Stretch on Step  - 1 x daily - 7 x weekly - 1 sets - 3 reps - 15-30 sec hold - Seated Figure 4 Piriformis Stretch  - 1 x daily - 7 x weekly - 1 sets - 3 reps - 15 sec hold      PATIENT EDUCATION: Education details: Updates to HEP Person educated: Patient Education method: Explanation Education comprehension: verbalized understanding       Note: Objective measures were completed at Evaluation unless otherwise noted.  DIAGNOSTIC FINDINGS: NA for this episode  COGNITION: Overall cognitive status: Within functional limits for tasks assessed   SENSATION: Light touch: WFL Sometimes reports pressure/heaviness in feet  MUSCLE TONE: WFL BLEs   POSTURE: rounded shoulders and forward head  LOWER EXTREMITY ROM:   ACTIVE ROM WFL  LOWER EXTREMITY MMT:    MMT Right Eval Left Eval  Hip flexion 3+ 3+  Hip extension    Hip abduction 4 4  Hip adduction 4 4  Hip internal rotation    Hip external rotation    Knee flexion 3+ 4  Knee extension 4 4  Ankle dorsiflexion 3+ 3+  Ankle plantarflexion    Ankle inversion    Ankle eversion    (Blank rows = not tested)    TRANSFERS: Assistive device  utilized: Environmental consultant - 4 wheeled  Sit to stand: Modified independence Stand to sit: Modified independence   GAIT: Gait pattern: step through pattern, decreased step length- Right, decreased stride length, and wide BOS Distance walked: 50 ft x 2 Assistive device utilized: Single point cane and Walker - 4 wheeled Level of assistance: SBA and CGA Comments: Pt brought in her cane (round tip base with SPC)  FUNCTIONAL TESTS:  5 times sit to stand: 13.63 sec arms crossed at chest Timed up and go (TUG): 17.37 with rollator; 20.91 sec with cane  10 meter walk test: 18.97 sec with rollator (1.73 ft/sec); 27.19 sec with cane (1.21 f/tsec) Berg Balance Scale: 32/56                                                                                                                                   TREATMENT DATE: 08/14/2023      GOALS: Goals reviewed with patient? Yes  SHORT TERM GOALS: Target date: 09/12/2023  Pt will be independent with HEP for improved strength, balance, gait. Baseline: reports "some days better than others" Goal status: MET 09/22/23  2.  Pt will improve 5x sit<>stand to less than or equal to 12 sec to demonstrate improved functional strength and transfer efficiency. Baseline:  13.63 sec; 14.49 sec without UE support 09/22/23 Goal status: NOT MET 09/22/23  3.  Pt will improve Berg score to at least 38/56 to decrease fall risk. Baseline: 32/56; 49/56 09/22/23 Goal status: MET 09/22/23  4.  Pt will improve gait velocity to at least 2 ft/sec with rollator for improved gait efficiency and safety. Baseline: 1.72 ft/sec; 2.1 ft/sec 09/22/23 Goal status: MET 09/22/23  LONG TERM GOALS: Target date: 10/10/2023>UPDATED TARGET 11/21/2023  Pt will be independent with progression of HEP for improved strength, balance, gait. Baseline:  Goal status: IN PROGRESS  10/10/23  2.  Pt will improve TUG score to less than or equal to 13.5 sec for decreased fall risk. Baseline: 17.37 sec with  rollator; >20 sec cane; 19.09 sec with cane 09/22/23>17 sec with cane 10/10/2023 Goal status: IN PROGRESS 10/10/2023  3.  Pt will improve Berg score to at least 45/56 to decrease fall risk. Baseline: 32/56; 49/56 09/22/23 Goal status: MET 09/22/23  4.  Pt will improve gait velocity to at least 1.8 ft/sec with cane for improved gait efficiency and safety.  Baseline: 1.58 ft/sec with cane 09/22/23, 1.75 ft/sec with cane 10/10/2023; 2.05 ft/sec 11/11/2023 Goal status: MET 11/11/2023  5.  Pt will verbalize continued community fitness upon d/c from PT. Baseline: pt not performing but was educated on this 09/22/23 Goal status: IN PROGRESS 10/10/23  6. Pt will improve DGI score to at least 18/24 to decrease fall risk.  Baseline:  11/24  Goal status:  INITIAL 10/10/2023  7.  Pt will negotiate single curb step with cane and supervision for improved community mobility.  Baseline:  typically uses rollator and family assist  Goal status:  INITIAL, 10/10/2023  ASSESSMENT:  CLINICAL IMPRESSION: Pt presents today with no new complaints. Skilled PT session focused on continued work on lower extremity strength, balance activities; focused on step up exercises and simulation of curb training.  Pt is able to perform with cane and min guard, lessening UE support, but she continues to report decreased confidence.  She reports difficulty sustaining strength needed for repeated squatting activities.  Pt will continue to benefit from skilled PT towards goals for improved functional mobility and decreased fall risk.   OBJECTIVE IMPAIRMENTS: Abnormal gait, decreased balance, decreased mobility, difficulty walking, and decreased strength.   ACTIVITY LIMITATIONS: bending, standing, squatting, transfers, bed mobility, and locomotion level  PARTICIPATION LIMITATIONS: driving, shopping, and community activity  PERSONAL FACTORS: 3+ comorbidities: see above are also affecting patient's functional outcome.   REHAB POTENTIAL:  Good  CLINICAL DECISION MAKING: Evolving/moderate complexity  EVALUATION COMPLEXITY: Moderate  PLAN:  PT FREQUENCY: 2x/week  PT DURATION: 6 weeks per recert 10/10/2023  PLANNED INTERVENTIONS: 97750- Physical Performance Testing, 97110-Therapeutic exercises, 97530- Therapeutic activity, 97112- Neuromuscular re-education, 97535- Self Care, 16109- Manual therapy, [phone]- Gait training, Patient/Family education, and Balance training  PLAN FOR NEXT SESSION: Check LTGs and discuss POC; may need to look at consolidating HEP.  Ask about YouTube videos, look into [facility]  asked pt to request that her ride keeps the car at clinic at next appointment to practice car transfers with 4WW or RW  (not here today, 6/10).  Continue working on balance EC; continue working on gait with cane; curb/single step negotiation.  Consider incorporating UE exercises into HEP.  Dessie Flow, PT 11/18/23 2:52 PM Phone: [phone] Fax: [phone]  [facility] at [facility] [address] [place], [address] [place], [place] [address] Phone # [phone] Fax # [phone]

## 2023-11-19 ENCOUNTER — Inpatient Hospital Stay (HOSPITAL_BASED_OUTPATIENT_CLINIC_OR_DEPARTMENT_OTHER)

## 2023-11-19 ENCOUNTER — Inpatient Hospital Stay (HOSPITAL_BASED_OUTPATIENT_CLINIC_OR_DEPARTMENT_OTHER): Admitting: Physician Assistant

## 2023-11-19 ENCOUNTER — Inpatient Hospital Stay: Attending: Internal Medicine

## 2023-11-19 VITALS — HR 100

## 2023-11-19 VITALS — BP 134/91 | HR 114 | Temp 98.5°F | Resp 18 | Wt 167.6 lb

## 2023-11-19 DIAGNOSIS — C3491 Malignant neoplasm of unspecified part of right bronchus or lung: Secondary | ICD-10-CM

## 2023-11-19 DIAGNOSIS — Z95828 Presence of other vascular implants and grafts: Secondary | ICD-10-CM

## 2023-11-19 DIAGNOSIS — Z5111 Encounter for antineoplastic chemotherapy: Secondary | ICD-10-CM | POA: Diagnosis not present

## 2023-11-19 DIAGNOSIS — C3431 Malignant neoplasm of lower lobe, right bronchus or lung: Secondary | ICD-10-CM | POA: Diagnosis not present

## 2023-11-19 DIAGNOSIS — Z79899 Other long term (current) drug therapy: Secondary | ICD-10-CM | POA: Insufficient documentation

## 2023-11-19 DIAGNOSIS — C7951 Secondary malignant neoplasm of bone: Secondary | ICD-10-CM | POA: Diagnosis not present

## 2023-11-19 DIAGNOSIS — C787 Secondary malignant neoplasm of liver and intrahepatic bile duct: Secondary | ICD-10-CM | POA: Insufficient documentation

## 2023-11-19 DIAGNOSIS — Z79631 Long term (current) use of antimetabolite agent: Secondary | ICD-10-CM | POA: Diagnosis not present

## 2023-11-19 DIAGNOSIS — E032 Hypothyroidism due to medicaments and other exogenous substances: Secondary | ICD-10-CM

## 2023-11-19 DIAGNOSIS — Z7901 Long term (current) use of anticoagulants: Secondary | ICD-10-CM | POA: Diagnosis not present

## 2023-11-19 LAB — CBC WITH DIFFERENTIAL (CANCER CENTER ONLY)
Abs Immature Granulocytes: 0.01 10*3/uL (ref 0.00–0.07)
Basophils Absolute: 0.1 10*3/uL (ref 0.0–0.1)
Basophils Relative: 1 %
Eosinophils Absolute: 0.1 10*3/uL (ref 0.0–0.5)
Eosinophils Relative: 2 %
HCT: 39.8 % (ref 36.0–46.0)
Hemoglobin: 13.2 g/dL (ref 12.0–15.0)
Immature Granulocytes: 0 %
Lymphocytes Relative: 12 %
Lymphs Abs: 0.6 10*3/uL — ABNORMAL LOW (ref 0.7–4.0)
MCH: 29.7 pg (ref 26.0–34.0)
MCHC: 33.2 g/dL (ref 30.0–36.0)
MCV: 89.6 fL (ref 80.0–100.0)
Monocytes Absolute: 0.6 10*3/uL (ref 0.1–1.0)
Monocytes Relative: 12 %
Neutro Abs: 3.6 10*3/uL (ref 1.7–7.7)
Neutrophils Relative %: 73 %
Platelet Count: 217 10*3/uL (ref 150–400)
RBC: 4.44 MIL/uL (ref 3.87–5.11)
RDW: 13.2 % (ref 11.5–15.5)
WBC Count: 5 10*3/uL (ref 4.0–10.5)
nRBC: 0 % (ref 0.0–0.2)

## 2023-11-19 LAB — CMP (CANCER CENTER ONLY)
ALT: 10 U/L (ref 0–44)
AST: 16 U/L (ref 15–41)
Albumin: 4.1 g/dL (ref 3.5–5.0)
Alkaline Phosphatase: 93 U/L (ref 38–126)
Anion gap: 7 (ref 5–15)
BUN: 16 mg/dL (ref 8–23)
CO2: 28 mmol/L (ref 22–32)
Calcium: 9.1 mg/dL (ref 8.9–10.3)
Chloride: 106 mmol/L (ref 98–111)
Creatinine: 0.78 mg/dL (ref 0.44–1.00)
GFR, Estimated: >60 mL/min (ref >60)
Glucose, Bld: 124 mg/dL — ABNORMAL HIGH (ref 70–99)
Potassium: 3.8 mmol/L (ref 3.5–5.1)
Sodium: 141 mmol/L (ref 135–145)
Total Bilirubin: 0.4 mg/dL (ref 0.0–1.2)
Total Protein: 7 g/dL (ref 6.5–8.1)

## 2023-11-19 LAB — TSH: TSH: 5.44 u[IU]/mL — ABNORMAL HIGH (ref 0.350–4.500)

## 2023-11-19 MED ORDER — SODIUM CHLORIDE 0.9 % IV SOLN
500.0000 mg/m2 | Freq: Once | INTRAVENOUS | Status: AC
  Administered 2023-11-19: 900 mg via INTRAVENOUS
  Filled 2023-11-19: qty 20

## 2023-11-19 MED ORDER — PROCHLORPERAZINE MALEATE 10 MG PO TABS
10.0000 mg | ORAL_TABLET | Freq: Once | ORAL | Status: AC
  Administered 2023-11-19: 10 mg via ORAL
  Filled 2023-11-19: qty 1

## 2023-11-19 MED ORDER — SODIUM CHLORIDE 0.9% FLUSH
10.0000 mL | INTRAVENOUS | Status: DC | PRN
  Administered 2023-11-19: 10 mL

## 2023-11-19 MED ORDER — HEPARIN SOD (PORK) LOCK FLUSH 100 UNIT/ML IV SOLN
500.0000 [IU] | Freq: Once | INTRAVENOUS | Status: AC | PRN
  Administered 2023-11-19: 500 [IU]

## 2023-11-19 MED ORDER — SODIUM CHLORIDE 0.9% FLUSH
10.0000 mL | Freq: Once | INTRAVENOUS | Status: AC
  Administered 2023-11-19: 10 mL

## 2023-11-19 MED ORDER — SODIUM CHLORIDE 0.9 % IV SOLN: Freq: Once | INTRAVENOUS | Status: AC

## 2023-11-19 MED ORDER — DEXAMETHASONE SODIUM PHOSPHATE 10 MG/ML IJ SOLN
10.0000 mg | Freq: Once | INTRAMUSCULAR | Status: AC
  Administered 2023-11-19: 10 mg via INTRAVENOUS
  Filled 2023-11-19: qty 1

## 2023-11-19 NOTE — Progress Notes (Signed)
 OK to treat with HR 100 per Cassie PA

## 2023-11-19 NOTE — Patient Instructions (Signed)
 [facility] - A DEPT OF [facility]  Discharge Instructions: Thank you for choosing [facility] to provide your oncology and hematology care.   If you have a lab appointment with the Cancer Center, please go directly to the Cancer Center and check in at the registration area.   Wear comfortable clothing and clothing appropriate for easy access to any Portacath or PICC line.   We strive to give you quality time with your provider. You may need to reschedule your appointment if you arrive late (15 or more minutes).  Arriving late affects you and other patients whose appointments are after yours.  Also, if you miss three or more appointments without notifying the office, you may be dismissed from the clinic at the provider's discretion.      For prescription refill requests, have your pharmacy contact our office and allow 72 hours for refills to be completed.    Today you received the following chemotherapy and/or immunotherapy agents :  Pemetrexed .       To help prevent nausea and vomiting after your treatment, we encourage you to take your nausea medication as directed.  BELOW ARE SYMPTOMS THAT SHOULD BE REPORTED IMMEDIATELY: *FEVER GREATER THAN 100.4 F (38 C) OR HIGHER *CHILLS OR SWEATING *NAUSEA AND VOMITING THAT IS NOT CONTROLLED WITH YOUR NAUSEA MEDICATION *UNUSUAL SHORTNESS OF BREATH *UNUSUAL BRUISING OR BLEEDING *URINARY PROBLEMS (pain or burning when urinating, or frequent urination) *BOWEL PROBLEMS (unusual diarrhea, constipation, pain near the anus) TENDERNESS IN MOUTH AND THROAT WITH OR WITHOUT PRESENCE OF ULCERS (sore throat, sores in mouth, or a toothache) UNUSUAL RASH, SWELLING OR PAIN  UNUSUAL VAGINAL DISCHARGE OR ITCHING   Items with * indicate a potential emergency and should be followed up as soon as possible or go to the Emergency Department if any problems should occur.  Please show the CHEMOTHERAPY ALERT CARD or  IMMUNOTHERAPY ALERT CARD at check-in to the Emergency Department and triage nurse.  Should you have questions after your visit or need to cancel or reschedule your appointment, please contact [facility] - A DEPT OF [facility]  Dept: [phone]  and follow the prompts.  Office hours are 8:00 a.m. to 4:30 p.m. Monday - Friday. Please note that voicemails left after 4:00 p.m. may not be returned until the following business day.  We are closed weekends and major holidays. You have access to a nurse at all times for urgent questions. Please call the main number to the clinic Dept: [phone] and follow the prompts.   For any non-urgent questions, you may also contact your provider using MyChart. We now offer e-Visits for anyone 73 and older to request care online for non-urgent symptoms. For details visit [email or web address].   Also download the MyChart app! Go to the app store, search "MyChart", open the app, select [facility], and log in with your MyChart username and password.

## 2023-11-20 ENCOUNTER — Encounter: Payer: Self-pay | Admitting: Physical Therapy

## 2023-11-20 ENCOUNTER — Ambulatory Visit: Admitting: Physical Therapy

## 2023-11-20 DIAGNOSIS — M6281 Muscle weakness (generalized): Secondary | ICD-10-CM

## 2023-11-20 DIAGNOSIS — R2681 Unsteadiness on feet: Secondary | ICD-10-CM

## 2023-11-20 DIAGNOSIS — R2689 Other abnormalities of gait and mobility: Secondary | ICD-10-CM

## 2023-11-20 LAB — T4: T4, Total: 13.2 ug/dL — ABNORMAL HIGH (ref 4.5–12.0)

## 2023-11-20 NOTE — Progress Notes (Signed)
 Faxed lab work to [facility] @ [place], attn to [employer].  Fax confirmed at [phone].

## 2023-11-20 NOTE — Patient Instructions (Signed)
 AHOY CLASSES through [place] [employer] [place] and [employer] or in person or on [place] [employer] [email or web address]

## 2023-11-20 NOTE — Therapy (Signed)
 OUTPATIENT PHYSICAL THERAPY NEURO TREATMENT/RECERT   Patient Name: Hayley Wu MRN: [id number] DOB:08-Jan-1951, 73 y.o., female Today's Date: 11/20/2023   PCP: Therman Flatter REFERRING PROVIDER: Lorenza Romans, FNP      END OF SESSION:  PT End of Session - 11/20/23 1024     Visit Number 23    Number of Visits 29    Date for PT Re-Evaluation 12/26/23    Authorization Type [employer]    Progress Note Due on Visit 30    PT Start Time 1020    PT Stop Time 1100    PT Time Calculation (min) 40 min    Equipment Utilized During Treatment Gait belt    Activity Tolerance Patient tolerated treatment well    Behavior During Therapy WFL for tasks assessed/performed                        Past Medical History:  Diagnosis Date   Allergy    Contact lens/glasses fitting    Hypertension    Past Surgical History:  Procedure Laterality Date   ABDOMINAL HYSTERECTOMY     BREAST SURGERY  2010   rt lumpectomy-neg   IR IMAGING GUIDED PORT INSERTION  12/18/2022   LAMINECTOMY N/A 09/21/2022   Procedure: THORACIC LAMINECTOMY FOR TUMOR;  Surgeon: Isadora Mar, MD;  Location: [facility] OR;  Service: Neurosurgery;  Laterality: N/A;   OPEN REDUCTION INTERNAL FIXATION (ORIF) DISTAL RADIAL FRACTURE Left 11/26/2013   Procedure: OPEN REDUCTION INTERNAL FIXATION (ORIF) LEFT  DISTAL RADIUS ;  Surgeon: Ilean Mall, MD;  Location: [facility];  Service: Orthopedics;  Laterality: Left;  with block   TUBAL LIGATION     Patient Active Problem List   Diagnosis Date Noted   Acute deep vein thrombosis (DVT) of femoral vein of both lower extremities (HCC) 03/19/2023   Normocytic anemia 03/01/2023   Moderate protein malnutrition (HCC) 03/01/2023   Acute respiratory failure with hypoxia (HCC) 03/01/2023   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   Cardiomegaly 03/01/2023   Pleural effusion due to CHF (congestive heart failure) (HCC) 03/01/2023    Port-A-Cath in place 12/30/2022   Encounter for antineoplastic immunotherapy 11/18/2022   Encounter for antineoplastic chemotherapy 11/18/2022   Constipation 11/05/2022   Goals of care, counseling/discussion 10/21/2022   Cancer, metastatic to bone (HCC) 10/04/2022   Adenocarcinoma of right lung, stage 4 (HCC) 10/01/2022   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 09/20/2022   Hypokalemia 09/20/2022   Lung mass 09/20/2022   Hypothyroidism 09/20/2022   Cholelithiasis 09/20/2022   Lumbar spondylosis 08/26/2022   Thoracic back pain 04/04/2022   Low back pain 04/04/2022   Hypertension 09/23/2011    ONSET DATE: 08/08/2023 (MD order)  REFERRING DIAG:  Z91.81 (ICD-10-CM) - History of falling  C34.91 (ICD-10-CM) - Malignant neoplasm of unspecified part of right bronchus or lung    THERAPY DIAG:  Unsteadiness on feet  Muscle weakness (generalized)  Other abnormalities of gait and mobility  Rationale for Evaluation and Treatment: Rehabilitation  SUBJECTIVE:  SUBJECTIVE STATEMENT: Had the chemo treatment yesterday, so a little tired.  I know balance is better, but confidence is not there.    Pt accompanied by: self  PERTINENT HISTORY: Stage IV (T1b, N2, M1 C) non-small cell lung cancer, adenocarcinoma presented with right lower lobe lung nodule in addition to small bilateral pulmonary nodules and right hilar and mediastinal lymphadenopathy in addition to liver and bone metastasis diagnosed in April 2024. 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    PAIN:  Are you  having pain? Yes: NPRS scale: 3/10 Pain location: low back Pain description: sore Aggravating factors: bending, sitting wrong  Relieving factors: tylenol    PRECAUTIONS: Fall and Other: currently in treatment for lung cancer  RED FLAGS: None   WEIGHT BEARING RESTRICTIONS: No  FALLS: Has patient fallen in last 6 months? No  LIVING ENVIRONMENT: Lives with: lives alone Lives in: House/apartment Stairs: 1 step to get into home Has following equipment at home: Single point cane, Environmental consultant - 4 wheeled, Wheelchair (manual), and shower chair  PLOF: Independent with household mobility with device and Independent with community mobility with device  PATIENT GOALS: Still need to work on balance, endurance, and would like to work on cane more.  OBJECTIVE:    TODAY'S TREATMENT: 11/20/2023 Activity Comments  FTSTS: 14.47 sec   TUG:  15.9 sec cane Improved from 17 sec  10 M walk:  15.81 sec (2.07 ft/sec)   DGI:  15/24 Improved from 11/24  Standing on compliant surface:  heel/toe raises x 10 Alt step taps x 10 Marching in place x 10 UE support       M-CTSIB  Condition 1: Firm Surface, EO 30 Sec, Mild Sway  Condition 2: Firm Surface, EC 30 Sec, Moderate Sway  Condition 3: Foam Surface, EO 30 Sec, Moderate Sway  Condition 4: Foam Surface, EC 19 Sec, Severe Sway     HOME EXERCISE PROGRAM: Access Code: [email or web address] URL: [email or web address] Date: 11/18/2023 Prepared by: [facility] - Outpatient  Rehab - [facility]  Program Notes practice walking inside the house with your cane for 5 minutes per day.   Exercises - Seated Hamstring Curls with Resistance  - 1 x daily - 5 x weekly - 3 sets - 10 reps - Seated Knee Extension with Resistance  - 1 x daily - 5 x weekly - 3 sets - 10 reps - Seated Hip Abduction with Resistance  - 1 x daily - 5 x weekly - 2 sets - 10 reps - Side Stepping with Counter Support  - 1 x daily - 5 x weekly - 2-3 sets - 10 reps - Alternating Step  Taps with Counter Support  - 1 x daily - 5 x weekly - 3 sets - 10 reps - Backward Walking with Counter Support  - 1 x daily - 5 x weekly - 1 sets - 5 reps - Squat with Chair Touch  - 1 x daily - 5 x weekly - 2 sets - 10 reps - Corner Balance Feet Together With Eyes Open  - 1 x daily - 5 x weekly - 2 sets - 30 sec hold - Corner Balance Feet Apart: Eyes Open With Head Turns  - 1 x daily - 5 x weekly - 2 sets - 30 sec hold - Romberg Stance with Head Nods  - 1 x daily - 5 x weekly - 2 sets - 30 sec  hold - Stride Stance Weight Shift  - 1-2 x daily - 7 x weekly - 1-2 sets - 10 reps - Standing Gastroc Stretch on Step  - 1 x daily - 7 x weekly - 1 sets - 3 reps - 15-30 sec hold - Seated Figure 4 Piriformis Stretch  - 1 x daily - 7 x weekly - 1 sets - 3 reps - 15 sec hold      PATIENT EDUCATION: Education details: Progress towards goals, POC; discussed options for community fitness and provided info on [employer] through [employer] and Rec on their YouTube channel.  Discussed trying to walk into therapy next visit with cane  Person educated: Patient Education method: Explanation Education comprehension: verbalized understanding       Note: Objective measures were completed at Evaluation unless otherwise noted.  DIAGNOSTIC FINDINGS: NA for this episode  COGNITION: Overall cognitive status: Within functional limits for tasks assessed   SENSATION: Light touch: WFL Sometimes reports pressure/heaviness in feet  MUSCLE TONE: WFL BLEs   POSTURE: rounded shoulders and forward head  LOWER EXTREMITY ROM:   ACTIVE ROM WFL  LOWER EXTREMITY MMT:    MMT Right Eval Left Eval  Hip flexion 3+ 3+  Hip extension    Hip abduction 4 4  Hip adduction 4 4  Hip internal rotation    Hip external rotation    Knee flexion 3+ 4  Knee extension 4 4  Ankle dorsiflexion 3+ 3+  Ankle plantarflexion    Ankle inversion    Ankle eversion    (Blank rows = not tested)    TRANSFERS: Assistive device  utilized: Environmental consultant - 4 wheeled  Sit to stand: Modified independence Stand to sit: Modified independence   GAIT: Gait pattern: step through pattern, decreased step length- Right, decreased stride length, and wide BOS Distance walked: 50 ft x 2 Assistive device utilized: Single point cane and Walker - 4 wheeled Level of assistance: SBA and CGA Comments: Pt brought in her cane (round tip base with SPC)  FUNCTIONAL TESTS:  5 times sit to stand: 13.63 sec arms crossed at chest Timed up and go (TUG): 17.37 with rollator; 20.91 sec with cane  10 meter walk test: 18.97 sec with rollator (1.73 ft/sec); 27.19 sec with cane (1.21 f/tsec) Berg Balance Scale: 32/56  [facility] PT Assessment - 11/20/23 1034       Standardized Balance Assessment   Standardized Balance Assessment Dynamic Gait Index      Dynamic Gait Index   Level Surface Moderate Impairment   10sec cane   Change in Gait Speed Mild Impairment    Gait with Horizontal Head Turns Mild Impairment   13 sec   Gait with Vertical Head Turns Mild Impairment   12 sec   Gait and Pivot Turn Mild Impairment    Step Over Obstacle Mild Impairment    Step Around Obstacles Mild Impairment    Steps Mild Impairment    Total Score 15    DGI comment: improved from 11/24  GOALS: Goals reviewed with patient? Yes  SHORT TERM GOALS: Target date: 09/12/2023  Pt will be independent with HEP for improved strength, balance, gait. Baseline: reports some days better than others Goal status: MET 09/22/23  2.  Pt will improve 5x sit<>stand to less than or equal to 12 sec to demonstrate improved functional strength and transfer efficiency. Baseline:  13.63 sec; 14.49 sec without UE support 09/22/23 Goal status: NOT MET 09/22/23  3.  Pt will improve Berg score to at least 38/56 to decrease fall risk. Baseline:  32/56; 49/56 09/22/23 Goal status: MET 09/22/23  4.  Pt will improve gait velocity to at least 2 ft/sec with rollator for improved gait efficiency and safety. Baseline: 1.72 ft/sec; 2.1 ft/sec 09/22/23 Goal status: MET 09/22/23  LONG TERM GOALS: Target date: 10/10/2023>UPDATED TARGET 11/21/2023>UPDATED TARGET 12/26/2023  Pt will be independent with progression of HEP for improved strength, balance, gait. Baseline: continues to be updated Goal status: IN PROGRESS  11/20/2023  2.  Pt will improve TUG score to less than or equal to 13.5 sec for decreased fall risk. Baseline: 15.9 sec cane 11/20/2023 Goal status: IN PROGRESS 11/20/2023  3.  Pt will improve Berg score to at least 45/56 to decrease fall risk. Baseline: 32/56; 49/56 09/22/23 Goal status: MET 09/22/23  4.  Pt will improve gait velocity to at least 1.8 ft/sec with cane for improved gait efficiency and safety.   Baseline: 1.58 ft/sec with cane 09/22/23, 1.75 ft/sec with cane 10/10/2023; 2.05 ft/sec 11/11/2023 Goal status: MET 11/11/2023  5.  Pt will verbalize continued community fitness upon d/c from PT. Baseline: have provided education, not yet started Goal status: IN PROGRESS 11/20/2023  6. Pt will improve DGI score to at least 18/24 to decrease fall risk.  Baseline:  11/24>15/24 11/20/2023  Goal status:  IN PROGRESS 11/20/2023  7.  Pt will negotiate single curb step with cane and supervision for improved community mobility.  Baseline:  CGA 11/20/2023  Goal status:  IN PROGRESS, 11/20/2023  8.  Pt will improve gait velocity to at least 2.3 ft/sec for improved gait efficiency and safety.  Baseline:  2.07 ft/sec  Goal status:  INITIAL, 11/20/2023    9.  Pt will perform Condition 4 on MCTSIB 30 sec with mod sway or better for improved balance.    Baseline:  19 sec    Goal status:  INITIAL 11/20/2023  ASSESSMENT:  CLINICAL IMPRESSION: Pt presents today with no new complaints.  Assessed LTGs, with pt progressing towards goals, but not yet  meeting goal level.  She has improved TUG score to 15.9 sec from 17 seconds, she has improved DGI score to 15/24 from 11/24.  She remains at fall risk per these measures, even with her progression.  She would benefit from continued skilled PT to address balance and gait progression for improved overall functional mobility and decreased fall risk.  Note updated goals.  OBJECTIVE IMPAIRMENTS: Abnormal gait, decreased balance, decreased mobility, difficulty walking, and decreased strength.   ACTIVITY LIMITATIONS: bending, standing, squatting, transfers, bed mobility, and locomotion level  PARTICIPATION LIMITATIONS: driving, shopping, and community activity  PERSONAL FACTORS: 3+ comorbidities: see above are also affecting patient's functional outcome.   REHAB POTENTIAL: Good  CLINICAL DECISION MAKING: Evolving/moderate complexity  EVALUATION COMPLEXITY: Moderate  PLAN:  PT FREQUENCY: 2x/week  PT DURATION: 6 weeks per recert 10/10/2023  PLANNED INTERVENTIONS: 97750- Physical Performance Testing, 97110-Therapeutic exercises, 97530- Therapeutic activity, 97112- Neuromuscular re-education, 97535- Self Care, [id number]- Manual therapy, [phone]- Gait training, Patient/Family  education, and Balance training  PLAN FOR NEXT SESSION: May need to look at consolidating HEP.  Ask about YouTube videos/[employer].  Multi-sensory balance; continue working on gait with cane; curb/single step negotiation, walking in and out of clinic with cane.  Consider incorporating UE exercises into HEP.  Dessie Flow, PT 11/20/23 1:00 PM Phone: [phone] Fax: [phone]  [facility] at [facility] [address], [address] [place], [place] [address] Phone # [phone] Fax # [phone]

## 2023-11-25 ENCOUNTER — Ambulatory Visit: Admitting: Physical Therapy

## 2023-11-25 ENCOUNTER — Encounter: Payer: Self-pay | Admitting: Physical Therapy

## 2023-11-25 DIAGNOSIS — M6281 Muscle weakness (generalized): Secondary | ICD-10-CM

## 2023-11-25 DIAGNOSIS — R2689 Other abnormalities of gait and mobility: Secondary | ICD-10-CM

## 2023-11-25 DIAGNOSIS — R2681 Unsteadiness on feet: Secondary | ICD-10-CM

## 2023-11-25 NOTE — Therapy (Signed)
 OUTPATIENT PHYSICAL THERAPY NEURO TREATMENT/RECERT   Patient Name: Hayley Wu MRN: [id number] DOB:05-11-1951, 73 y.o., female Today's Date: 11/25/2023   PCP: Therman Flatter REFERRING PROVIDER: Lorenza Romans, FNP      END OF SESSION:  PT End of Session - 11/25/23 1015     Visit Number 24    Number of Visits 29    Date for PT Re-Evaluation 12/26/23    Authorization Type [employer]    Progress Note Due on Visit 30    PT Start Time 1015    PT Stop Time 1101    PT Time Calculation (min) 46 min    Equipment Utilized During Treatment Gait belt    Activity Tolerance Patient tolerated treatment well    Behavior During Therapy WFL for tasks assessed/performed                         Past Medical History:  Diagnosis Date   Allergy    Contact lens/glasses fitting    Hypertension    Past Surgical History:  Procedure Laterality Date   ABDOMINAL HYSTERECTOMY     BREAST SURGERY  2010   rt lumpectomy-neg   IR IMAGING GUIDED PORT INSERTION  12/18/2022   LAMINECTOMY N/A 09/21/2022   Procedure: THORACIC LAMINECTOMY FOR TUMOR;  Surgeon: Isadora Mar, MD;  Location: [facility];  Service: Neurosurgery;  Laterality: N/A;   OPEN REDUCTION INTERNAL FIXATION (ORIF) DISTAL RADIAL FRACTURE Left 11/26/2013   Procedure: OPEN REDUCTION INTERNAL FIXATION (ORIF) LEFT  DISTAL RADIUS ;  Surgeon: Ilean Mall, MD;  Location: [facility];  Service: Orthopedics;  Laterality: Left;  with block   TUBAL LIGATION     Patient Active Problem List   Diagnosis Date Noted   Acute deep vein thrombosis (DVT) of femoral vein of both lower extremities (HCC) 03/19/2023   Normocytic anemia 03/01/2023   Moderate protein malnutrition (HCC) 03/01/2023   Acute respiratory failure with hypoxia (HCC) 03/01/2023   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   Cardiomegaly 03/01/2023   Pleural effusion due to CHF (congestive heart failure) (HCC) 03/01/2023    Port-A-Cath in place 12/30/2022   Encounter for antineoplastic immunotherapy 11/18/2022   Encounter for antineoplastic chemotherapy 11/18/2022   Constipation 11/05/2022   Goals of care, counseling/discussion 10/21/2022   Cancer, metastatic to bone (HCC) 10/04/2022   Adenocarcinoma of right lung, stage 4 (HCC) 10/01/2022   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 09/20/2022   Hypokalemia 09/20/2022   Lung mass 09/20/2022   Hypothyroidism 09/20/2022   Cholelithiasis 09/20/2022   Lumbar spondylosis 08/26/2022   Thoracic back pain 04/04/2022   Low back pain 04/04/2022   Hypertension 09/23/2011    ONSET DATE: 08/08/2023 (MD order)  REFERRING DIAG:  Z91.81 (ICD-10-CM) - History of falling  C34.91 (ICD-10-CM) - Malignant neoplasm of unspecified part of right bronchus or lung    THERAPY DIAG:  Unsteadiness on feet  Other abnormalities of gait and mobility  Muscle weakness (generalized)  Rationale for Evaluation and Treatment: Rehabilitation  SUBJECTIVE:  SUBJECTIVE STATEMENT: Nervous walking in with the cane.  It was chemo week, so low key all weekend.  Pt accompanied by: self  PERTINENT HISTORY: Stage IV (T1b, N2, M1 C) non-small cell lung cancer, adenocarcinoma presented with right lower lobe lung nodule in addition to small bilateral pulmonary nodules and right hilar and mediastinal lymphadenopathy in addition to liver and bone metastasis diagnosed in April 2024. 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    PAIN:  Are you having pain? Yes: NPRS scale:  3/10 Pain location: low back Pain description: sore Aggravating factors: bending, sitting wrong  Relieving factors: tylenol    PRECAUTIONS: Fall and Other: currently in treatment for lung cancer  RED FLAGS: None   WEIGHT BEARING RESTRICTIONS: No  FALLS: Has patient fallen in last 6 months? No  LIVING ENVIRONMENT: Lives with: lives alone Lives in: House/apartment Stairs: 1 step to get into home Has following equipment at home: Single point cane, Environmental consultant - 4 wheeled, Wheelchair (manual), and shower chair  PLOF: Independent with household mobility with device and Independent with community mobility with device  PATIENT GOALS: Still need to work on balance, endurance, and would like to work on cane more.  OBJECTIVE:    TODAY'S TREATMENT: 11/25/2023 Activity Comments  PT observed pt walking into clinic from parking lot with cane No unsteadiness, sister walks with supervision  In parallel bars: Gait with head turns Gait with head nods Forward/back walking Gait with turns 1 UE support  Short distance gait with turns with cane, without cane, then carrying items Supervision, more comfortable with turns to L  Figure-8 turns with cane, without cane, 3 reps each 1 episode of mild unsteadiness without cane  IT band tightness noted Discussed ways to massage IT band at home  Standing hip abduction 10 reps, then 10 reps with weight Standing hip extension 10 reps 3#  Feet together EC head turns/nods on solid surface On Airex:   -Feet apart with lateral weightshifting, ant/post weightshifting -Feet apart with weighted ball toss and added cognitive naming task  Min/mod sway  For hip strategy  Gait to car, negotiating curb, with supervision, with cane Decreased eccentric control down curb         HOME EXERCISE PROGRAM: Access Code: [email or web address] URL: [email or web address] Date: 11/18/2023 Prepared by: [facility] - Outpatient  Rehab - [facility]  Program  Notes practice walking inside the house with your cane for 5 minutes per day.   Exercises - Seated Hamstring Curls with Resistance  - 1 x daily - 5 x weekly - 3 sets - 10 reps - Seated Knee Extension with Resistance  - 1 x daily - 5 x weekly - 3 sets - 10 reps - Seated Hip Abduction with Resistance  - 1 x daily - 5 x weekly - 2 sets - 10 reps - Side Stepping with Counter Support  - 1 x daily - 5 x weekly - 2-3 sets - 10 reps - Alternating Step Taps with Counter Support  - 1 x daily - 5 x weekly - 3 sets - 10 reps - Backward Walking with Counter Support  - 1 x daily - 5 x weekly - 1 sets - 5 reps - Squat with Chair Touch  - 1 x daily - 5 x weekly - 2 sets - 10 reps - Corner Balance Feet Together With Eyes Open  - 1 x daily - 5 x  weekly - 2 sets - 30 sec hold - Corner Balance Feet Apart: Eyes Open With Head Turns  - 1 x daily - 5 x weekly - 2 sets - 30 sec hold - Romberg Stance with Head Nods  - 1 x daily - 5 x weekly - 2 sets - 30 sec hold - Stride Stance Weight Shift  - 1-2 x daily - 7 x weekly - 1-2 sets - 10 reps - Standing Gastroc Stretch on Step  - 1 x daily - 7 x weekly - 1 sets - 3 reps - 15-30 sec hold - Seated Figure 4 Piriformis Stretch  - 1 x daily - 7 x weekly - 1 sets - 3 reps - 15 sec hold      PATIENT EDUCATION: Education details: Verbally reviewed pt's HEP and she feels comfortable with full HEP as it is; IT band massage to address hip pain and fatigue with standing Person educated: Patient Education method: Explanation Education comprehension: verbalized understanding       Note: Objective measures were completed at Evaluation unless otherwise noted.  DIAGNOSTIC FINDINGS: NA for this episode  COGNITION: Overall cognitive status: Within functional limits for tasks assessed   SENSATION: Light touch: WFL Sometimes reports pressure/heaviness in feet  MUSCLE TONE: WFL BLEs   POSTURE: rounded shoulders and forward head  LOWER EXTREMITY ROM:   ACTIVE ROM  WFL  LOWER EXTREMITY MMT:    MMT Right Eval Left Eval  Hip flexion 3+ 3+  Hip extension    Hip abduction 4 4  Hip adduction 4 4  Hip internal rotation    Hip external rotation    Knee flexion 3+ 4  Knee extension 4 4  Ankle dorsiflexion 3+ 3+  Ankle plantarflexion    Ankle inversion    Ankle eversion    (Blank rows = not tested)    TRANSFERS: Assistive device utilized: Environmental consultant - 4 wheeled  Sit to stand: Modified independence Stand to sit: Modified independence   GAIT: Gait pattern: step through pattern, decreased step length- Right, decreased stride length, and wide BOS Distance walked: 50 ft x 2 Assistive device utilized: Single point cane and Walker - 4 wheeled Level of assistance: SBA and CGA Comments: Pt brought in her cane (round tip base with SPC)  FUNCTIONAL TESTS:  5 times sit to stand: 13.63 sec arms crossed at chest Timed up and go (TUG): 17.37 with rollator; 20.91 sec with cane  10 meter walk test: 18.97 sec with rollator (1.73 ft/sec); 27.19 sec with cane (1.21 f/tsec) Randye Buttner Balance Scale: 32/56                                                                                                                                         GOALS: Goals reviewed with patient? Yes  SHORT TERM GOALS: Target date: 09/12/2023  Pt will be independent with HEP for improved strength, balance,  gait. Baseline: reports some days better than others Goal status: MET 09/22/23  2.  Pt will improve 5x sit<>stand to less than or equal to 12 sec to demonstrate improved functional strength and transfer efficiency. Baseline:  13.63 sec; 14.49 sec without UE support 09/22/23 Goal status: NOT MET 09/22/23  3.  Pt will improve Berg score to at least 38/56 to decrease fall risk. Baseline: 32/56; 49/56 09/22/23 Goal status: MET 09/22/23  4.  Pt will improve gait velocity to at least 2 ft/sec with rollator for improved gait efficiency and safety. Baseline: 1.72 ft/sec; 2.1  ft/sec 09/22/23 Goal status: MET 09/22/23  LONG TERM GOALS: Target date: 10/10/2023>UPDATED TARGET 11/21/2023>UPDATED TARGET 12/26/2023  Pt will be independent with progression of HEP for improved strength, balance, gait. Baseline: continues to be updated Goal status: IN PROGRESS  11/20/2023  2.  Pt will improve TUG score to less than or equal to 13.5 sec for decreased fall risk. Baseline: 15.9 sec cane 11/20/2023 Goal status: IN PROGRESS 11/20/2023  3.  Pt will improve Berg score to at least 45/56 to decrease fall risk. Baseline: 32/56; 49/56 09/22/23 Goal status: MET 09/22/23  4.  Pt will improve gait velocity to at least 1.8 ft/sec with cane for improved gait efficiency and safety.   Baseline: 1.58 ft/sec with cane 09/22/23, 1.75 ft/sec with cane 10/10/2023; 2.05 ft/sec 11/11/2023 Goal status: MET 11/11/2023  5.  Pt will verbalize continued community fitness upon d/c from PT. Baseline: have provided education, not yet started Goal status: IN PROGRESS 11/20/2023  6. Pt will improve DGI score to at least 18/24 to decrease fall risk.  Baseline:  11/24>15/24 11/20/2023  Goal status:  IN PROGRESS 11/20/2023  7.  Pt will negotiate single curb step with cane and supervision for improved community mobility.  Baseline:  CGA 11/20/2023  Goal status:  IN PROGRESS, 11/20/2023  8.  Pt will improve gait velocity to at least 2.3 ft/sec for improved gait efficiency and safety.  Baseline:  2.07 ft/sec  Goal status:  INITIAL, 11/20/2023    9.  Pt will perform Condition 4 on MCTSIB 30 sec with mod sway or better for improved balance.    Baseline:  19 sec    Goal status:  INITIAL 11/20/2023  ASSESSMENT:  CLINICAL IMPRESSION: Pt presents today with no new complaints; she does walk into therapy with cane today!  Skilled PT session focused on gait tasks and dynamic/static balance.  Pt is able to perform dynamic balance with 1 UE support and with gait activities without cane, she has one LOB and recovers on her own.   She continues to fatigue quickly with activities, but overall tolerates well.  No overt LOB noted with gait walking in and out of therapy with cane (versus rollator).  Pt will continue to benefit from skilled PT towards goals for improved functional mobility and decreased fall risk.   OBJECTIVE IMPAIRMENTS: Abnormal gait, decreased balance, decreased mobility, difficulty walking, and decreased strength.   ACTIVITY LIMITATIONS: bending, standing, squatting, transfers, bed mobility, and locomotion level  PARTICIPATION LIMITATIONS: driving, shopping, and community activity  PERSONAL FACTORS: 3+ comorbidities: see above are also affecting patient's functional outcome.   REHAB POTENTIAL: Good  CLINICAL DECISION MAKING: Evolving/moderate complexity  EVALUATION COMPLEXITY: Moderate  PLAN:  PT FREQUENCY: 2x/week  PT DURATION: 6 weeks per recert 10/10/2023  PLANNED INTERVENTIONS: 97750- Physical Performance Testing, 97110-Therapeutic exercises, 97530- Therapeutic activity, [id number]- Neuromuscular re-education, 97535- Self Care, 11914- Manual therapy, [phone]- Gait training, Patient/Family education, and Balance training  PLAN FOR NEXT SESSION: Dynamic balance activities.  Ask about YouTube videos/AHOY.  Multi-sensory balance; continue working on gait with cane; curb/single step negotiation, walking in and out of clinic with cane.  Consider incorporating UE exercises into HEP.  Dessie Flow, PT 11/25/23 11:14 AM Phone: [phone] Fax: [phone]  [facility] [address] [place], [address] [place], [place] [address] Phone # [phone] Fax # [phone]

## 2023-11-27 ENCOUNTER — Encounter: Payer: Self-pay | Admitting: Physical Therapy

## 2023-11-27 ENCOUNTER — Ambulatory Visit: Admitting: Physical Therapy

## 2023-11-27 DIAGNOSIS — M6281 Muscle weakness (generalized): Secondary | ICD-10-CM

## 2023-11-27 DIAGNOSIS — R2681 Unsteadiness on feet: Secondary | ICD-10-CM | POA: Diagnosis not present

## 2023-11-27 DIAGNOSIS — R2689 Other abnormalities of gait and mobility: Secondary | ICD-10-CM

## 2023-11-27 NOTE — Therapy (Signed)
 OUTPATIENT PHYSICAL THERAPY NEURO TREATMENT   Patient Name: Hayley Wu MRN: [id number] DOB:07/24/1950, 73 y.o., female Today's Date: 11/27/2023   PCP: Therman Flatter REFERRING PROVIDER: Ayesha Lente Lowell Rude, FNP      END OF SESSION:  PT End of Session - 11/27/23 1023     Visit Number 25    Number of Visits 29    Date for PT Re-Evaluation 12/26/23    Authorization Type [employer]    Progress Note Due on Visit 30    PT Start Time 1022    PT Stop Time 1100    PT Time Calculation (min) 38 min    Equipment Utilized During Treatment Gait belt    Activity Tolerance Patient tolerated treatment well    Behavior During Therapy WFL for tasks assessed/performed                          Past Medical History:  Diagnosis Date   Allergy    Contact lens/glasses fitting    Hypertension    Past Surgical History:  Procedure Laterality Date   ABDOMINAL HYSTERECTOMY     BREAST SURGERY  2010   rt lumpectomy-neg   IR IMAGING GUIDED PORT INSERTION  12/18/2022   LAMINECTOMY N/A 09/21/2022   Procedure: THORACIC LAMINECTOMY FOR TUMOR;  Surgeon: Isadora Mar, MD;  Location: [facility] OR;  Service: Neurosurgery;  Laterality: N/A;   OPEN REDUCTION INTERNAL FIXATION (ORIF) DISTAL RADIAL FRACTURE Left 11/26/2013   Procedure: OPEN REDUCTION INTERNAL FIXATION (ORIF) LEFT  DISTAL RADIUS ;  Surgeon: Ilean Mall, MD;  Location: [facility];  Service: Orthopedics;  Laterality: Left;  with block   TUBAL LIGATION     Patient Active Problem List   Diagnosis Date Noted   Acute deep vein thrombosis (DVT) of femoral vein of both lower extremities (HCC) 03/19/2023   Normocytic anemia 03/01/2023   Moderate protein malnutrition (HCC) 03/01/2023   Acute respiratory failure with hypoxia (HCC) 03/01/2023   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   Cardiomegaly 03/01/2023   Pleural effusion due to CHF (congestive heart failure) (HCC) 03/01/2023    Port-A-Cath in place 12/30/2022   Encounter for antineoplastic immunotherapy 11/18/2022   Encounter for antineoplastic chemotherapy 11/18/2022   Constipation 11/05/2022   Goals of care, counseling/discussion 10/21/2022   Cancer, metastatic to bone (HCC) 10/04/2022   Adenocarcinoma of right lung, stage 4 (HCC) 10/01/2022   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 09/20/2022   Hypokalemia 09/20/2022   Lung mass 09/20/2022   Hypothyroidism 09/20/2022   Cholelithiasis 09/20/2022   Lumbar spondylosis 08/26/2022   Thoracic back pain 04/04/2022   Low back pain 04/04/2022   Hypertension 09/23/2011    ONSET DATE: 08/08/2023 (MD order)  REFERRING DIAG:  Z91.81 (ICD-10-CM) - History of falling  C34.91 (ICD-10-CM) - Malignant neoplasm of unspecified part of right bronchus or lung    THERAPY DIAG:  Other abnormalities of gait and mobility  Unsteadiness on feet  Muscle weakness (generalized)  Rationale for Evaluation and Treatment: Rehabilitation  SUBJECTIVE:  SUBJECTIVE STATEMENT: Walked in with the cane today with sister-in-law supervision.  Was able to do the curb without help.  Still not feeling great from the chemo effects.  Pt accompanied by: self  PERTINENT HISTORY: Stage IV (T1b, N2, M1 C) non-small cell lung cancer, adenocarcinoma presented with right lower lobe lung nodule in addition to small bilateral pulmonary nodules and right hilar and mediastinal lymphadenopathy in addition to liver and bone metastasis diagnosed in April 2024. She is status post decompressive thoracic laminectomy of the T7 with spinal cord decompression and biopsy of the epidural mass in  April 2024.    PAIN:  Are you having pain? Yes: NPRS scale: 3/10 Pain location: low back Pain description: sore Aggravating factors: bending, sitting wrong  Relieving factors: tylenol    PRECAUTIONS: Fall and Other: currently in treatment for lung cancer  RED FLAGS: None   WEIGHT BEARING RESTRICTIONS: No  FALLS: Has patient fallen in last 6 months? No  LIVING ENVIRONMENT: Lives with: lives alone Lives in: House/apartment Stairs: 1 step to get into home Has following equipment at home: Single point cane, Environmental consultant - 4 wheeled, Wheelchair (manual), and shower chair  PLOF: Independent with household mobility with device and Independent with community mobility with device  PATIENT GOALS: Still need to work on balance, endurance, and would like to work on cane more.  OBJECTIVE:    TODAY'S TREATMENT: 11/27/2023 Activity Comments  Gait activities: Gait with head turns Gait with head nods Forward/back walking with cane/no cane Gait with turns 2 min bouts; added conversation/cognitive tasks  Gait with carry task, 20 ft x 2 reps, min guard 5#  Gait with figure-8 turns with cane, no cane With added cognitive task  Standing on Airex beam: -Marching in place, 2 x 10 -SLS 5 reps, 3 sec hold -Forward step taps 2 x 10 -Side step taps 2 x 10 -Back step taps 2 x 10 BUE support                     HOME EXERCISE PROGRAM: Access Code: [email or web address] URL: [email or web address] Date: 11/18/2023 Prepared by: [facility] - Outpatient  Rehab - [facility]  Program Notes practice walking inside the house with your cane for 5 minutes per day.   Exercises - Seated Hamstring Curls with Resistance  - 1 x daily - 5 x weekly - 3 sets - 10 reps - Seated Knee Extension with Resistance  - 1 x daily - 5 x weekly - 3 sets - 10 reps - Seated Hip Abduction with Resistance  - 1 x daily - 5 x weekly - 2 sets - 10 reps - Side Stepping with Counter Support  - 1 x daily - 5 x  weekly - 2-3 sets - 10 reps - Alternating Step Taps with Counter Support  - 1 x daily - 5 x weekly - 3 sets - 10 reps - Backward Walking with Counter Support  - 1 x daily - 5 x weekly - 1 sets - 5 reps - Squat with Chair Touch  - 1 x daily - 5 x weekly - 2 sets - 10 reps - Corner Balance Feet Together With Eyes Open  - 1 x daily - 5 x weekly - 2 sets - 30 sec hold - Corner Balance Feet Apart: Eyes Open With Head Turns  - 1 x daily - 5 x weekly - 2 sets - 30 sec hold - Romberg Stance with Head Nods  - 1 x daily - 5  x weekly - 2 sets - 30 sec hold - Stride Stance Weight Shift  - 1-2 x daily - 7 x weekly - 1-2 sets - 10 reps - Standing Gastroc Stretch on Step  - 1 x daily - 7 x weekly - 1 sets - 3 reps - 15-30 sec hold - Seated Figure 4 Piriformis Stretch  - 1 x daily - 7 x weekly - 1 sets - 3 reps - 15 sec hold      PATIENT EDUCATION: Education details: Discussed fall prevention in relation to pt's upcoming trip [place] [employer] Person educated: Patient Education method: Explanation Education comprehension: verbalized understanding       Note: Objective measures were completed at Evaluation unless otherwise noted.  DIAGNOSTIC FINDINGS: NA for this episode  COGNITION: Overall cognitive status: Within functional limits for tasks assessed   SENSATION: Light touch: WFL Sometimes reports pressure/heaviness in feet  MUSCLE TONE: WFL BLEs   POSTURE: rounded shoulders and forward head  LOWER EXTREMITY ROM:   ACTIVE ROM WFL  LOWER EXTREMITY MMT:    MMT Right Eval Left Eval  Hip flexion 3+ 3+  Hip extension    Hip abduction 4 4  Hip adduction 4 4  Hip internal rotation    Hip external rotation    Knee flexion 3+ 4  Knee extension 4 4  Ankle dorsiflexion 3+ 3+  Ankle plantarflexion    Ankle inversion    Ankle eversion    (Blank rows = not tested)    TRANSFERS: Assistive device utilized: Environmental consultant - 4 wheeled  Sit to stand: Modified independence Stand to  sit: Modified independence   GAIT: Gait pattern: step through pattern, decreased step length- Right, decreased stride length, and wide BOS Distance walked: 50 ft x 2 Assistive device utilized: Single point cane and Walker - 4 wheeled Level of assistance: SBA and CGA Comments: Pt brought in her cane (round tip base with SPC)  FUNCTIONAL TESTS:  5 times sit to stand: 13.63 sec arms crossed at chest Timed up and go (TUG): 17.37 with rollator; 20.91 sec with cane  10 meter walk test: 18.97 sec with rollator (1.73 ft/sec); 27.19 sec with cane (1.21 f/tsec) Randye Buttner Balance Scale: 32/56                                                                                                                                         GOALS: Goals reviewed with patient? Yes  SHORT TERM GOALS: Target date: 09/12/2023  Pt will be independent with HEP for improved strength, balance, gait. Baseline: reports some days better than others Goal status: MET 09/22/23  2.  Pt will improve 5x sit<>stand to less than or equal to 12 sec to demonstrate improved functional strength and transfer efficiency. Baseline:  13.63 sec; 14.49 sec without UE support 09/22/23 Goal status: NOT MET 09/22/23  3.  Pt will improve  Berg score to at least 38/56 to decrease fall risk. Baseline: 32/56; 49/56 09/22/23 Goal status: MET 09/22/23  4.  Pt will improve gait velocity to at least 2 ft/sec with rollator for improved gait efficiency and safety. Baseline: 1.72 ft/sec; 2.1 ft/sec 09/22/23 Goal status: MET 09/22/23  LONG TERM GOALS: Target date: 10/10/2023>UPDATED TARGET 11/21/2023>UPDATED TARGET 12/26/2023  Pt will be independent with progression of HEP for improved strength, balance, gait. Baseline: continues to be updated Goal status: IN PROGRESS  11/20/2023  2.  Pt will improve TUG score to less than or equal to 13.5 sec for decreased fall risk. Baseline: 15.9 sec cane 11/20/2023 Goal status: IN PROGRESS 11/20/2023  3.  Pt  will improve Berg score to at least 45/56 to decrease fall risk. Baseline: 32/56; 49/56 09/22/23 Goal status: MET 09/22/23  4.  Pt will improve gait velocity to at least 1.8 ft/sec with cane for improved gait efficiency and safety.   Baseline: 1.58 ft/sec with cane 09/22/23, 1.75 ft/sec with cane 10/10/2023; 2.05 ft/sec 11/11/2023 Goal status: MET 11/11/2023  5.  Pt will verbalize continued community fitness upon d/c from PT. Baseline: have provided education, not yet started Goal status: IN PROGRESS 11/20/2023  6. Pt will improve DGI score to at least 18/24 to decrease fall risk.  Baseline:  11/24>15/24 11/20/2023  Goal status:  IN PROGRESS 11/20/2023  7.  Pt will negotiate single curb step with cane and supervision for improved community mobility.  Baseline:  CGA 11/20/2023  Goal status:  IN PROGRESS, 11/20/2023  8.  Pt will improve gait velocity to at least 2.3 ft/sec for improved gait efficiency and safety.  Baseline:  2.07 ft/sec  Goal status:  INITIAL, 11/20/2023    9.  Pt will perform Condition 4 on MCTSIB 30 sec with mod sway or better for improved balance.    Baseline:  19 sec    Goal status:  INITIAL 11/20/2023  ASSESSMENT:  CLINICAL IMPRESSION: Pt presents today with no new complaints.  She is continuing to walk in and out of therapy with cane (2nd time) and was able to negotiate curb mod independently with family nearby. Skilled PT session focused on gait activities, dynamic balance, compliant surface.  With gait with head turns, she has increased lateral sway/near LOB; worked on step strategy to address this.  With lateral step strategy, pt has decreased weightshift out of BOS.  She will continue to benefit from skilled PT towards goals for improved functional mobility and decreased fall risk.   OBJECTIVE IMPAIRMENTS: Abnormal gait, decreased balance, decreased mobility, difficulty walking, and decreased strength.   ACTIVITY LIMITATIONS: bending, standing, squatting, transfers, bed  mobility, and locomotion level  PARTICIPATION LIMITATIONS: driving, shopping, and community activity  PERSONAL FACTORS: 3+ comorbidities: see above are also affecting patient's functional outcome.   REHAB POTENTIAL: Good  CLINICAL DECISION MAKING: Evolving/moderate complexity  EVALUATION COMPLEXITY: Moderate  PLAN:  PT FREQUENCY: 2x/week  PT DURATION: 6 weeks per recert 10/10/2023  PLANNED INTERVENTIONS: 97750- Physical Performance Testing, 97110-Therapeutic exercises, 97530- Therapeutic activity, 97112- Neuromuscular re-education, 97535- Self Care, [address]- Manual therapy, [phone]- Gait training, Patient/Family education, and Balance training  PLAN FOR NEXT SESSION: Dynamic balance activities.  Ask about YouTube videos/AHOY.  Multi-sensory balance; continue working on gait with cane; curb/single step negotiation, walking in and out of clinic with cane.  Consider incorporating UE exercises into HEP.  Dessie Flow, PT 11/27/23 11:06 AM Phone: [phone] Fax: [phone]  [name] [address], [address] [place], [place] [address] Phone # [phone] Fax # [phone]

## 2023-11-28 ENCOUNTER — Other Ambulatory Visit: Payer: Self-pay | Admitting: Physician Assistant

## 2023-11-28 DIAGNOSIS — C3491 Malignant neoplasm of unspecified part of right bronchus or lung: Secondary | ICD-10-CM

## 2023-12-02 ENCOUNTER — Ambulatory Visit: Admitting: Physical Therapy

## 2023-12-08 NOTE — Therapy (Signed)
 OUTPATIENT PHYSICAL THERAPY NEURO TREATMENT   Patient Name: Hayley Wu MRN: [id number] DOB:05/16/1951, 73 y.o., female Today's Date: 12/09/2023   PCP: Lanis Thresa JAYSON DEVONNA REFERRING PROVIDER: Crecencio [name], FNP      END OF SESSION:  PT End of Session - 12/09/23 1059     Visit Number 26    Number of Visits 29    Date for PT Re-Evaluation 12/26/23    Authorization Type [employer]    Progress Note Due on Visit 30    PT Start Time 1020    PT Stop Time 1058    PT Time Calculation (min) 38 min    Equipment Utilized During Treatment Gait belt    Activity Tolerance Patient tolerated treatment well    Behavior During Therapy WFL for tasks assessed/performed                           Past Medical History:  Diagnosis Date   Allergy    Contact lens/glasses fitting    Hypertension    Past Surgical History:  Procedure Laterality Date   ABDOMINAL HYSTERECTOMY     BREAST SURGERY  2010   rt lumpectomy-neg   IR IMAGING GUIDED PORT INSERTION  12/18/2022   LAMINECTOMY N/A 09/21/2022   Procedure: THORACIC LAMINECTOMY FOR TUMOR;  Surgeon: Joshua Alm RAMAN, MD;  Location: [facility] OR;  Service: Neurosurgery;  Laterality: N/A;   OPEN REDUCTION INTERNAL FIXATION (ORIF) DISTAL RADIAL FRACTURE Left 11/26/2013   Procedure: OPEN REDUCTION INTERNAL FIXATION (ORIF) LEFT  DISTAL RADIUS ;  Surgeon: Dempsey JINNY Sensor, MD;  Location: [facility];  Service: Orthopedics;  Laterality: Left;  with block   TUBAL LIGATION     Patient Active Problem List   Diagnosis Date Noted   Acute deep vein thrombosis (DVT) of femoral vein of both lower extremities (HCC) 03/19/2023   Normocytic anemia 03/01/2023   Moderate protein malnutrition (HCC) 03/01/2023   Acute respiratory failure with hypoxia (HCC) 03/01/2023   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   Cardiomegaly 03/01/2023   Pleural effusion due to CHF (congestive heart failure) (HCC) 03/01/2023    Port-A-Cath in place 12/30/2022   Encounter for antineoplastic immunotherapy 11/18/2022   Encounter for antineoplastic chemotherapy 11/18/2022   Constipation 11/05/2022   Goals of care, counseling/discussion 10/21/2022   Cancer, metastatic to bone (HCC) 10/04/2022   Adenocarcinoma of right lung, stage 4 (HCC) 10/01/2022   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 09/20/2022   Hypokalemia 09/20/2022   Lung mass 09/20/2022   Hypothyroidism 09/20/2022   Cholelithiasis 09/20/2022   Lumbar spondylosis 08/26/2022   Thoracic back pain 04/04/2022   Low back pain 04/04/2022   Hypertension 09/23/2011    ONSET DATE: 08/08/2023 (MD order)  REFERRING DIAG:  Z91.81 (ICD-10-CM) - History of falling  C34.91 (ICD-10-CM) - Malignant neoplasm of unspecified part of right bronchus or lung    THERAPY DIAG:  Other abnormalities of gait and mobility  Unsteadiness on feet  Muscle weakness (generalized)  Other symptoms and signs involving the musculoskeletal system  Rationale for Evaluation and Treatment: Rehabilitation  SUBJECTIVE:  SUBJECTIVE STATEMENT: Went to [employer] for [employer] wrestling. Had to go up 20+ steps 2x. Been feeling puny since coming back. Was not able to find [employer] videos on the tv.   Pt accompanied by: self  PERTINENT HISTORY: Stage IV (T1b, N2, M1 C) non-small cell lung cancer, adenocarcinoma presented with right lower lobe lung nodule in addition to small bilateral pulmonary nodules and right hilar and mediastinal lymphadenopathy in addition to liver and bone metastasis diagnosed in April 2024. She is status post decompressive thoracic laminectomy of the  T7 with spinal cord decompression and biopsy of the epidural mass in April 2024.    PAIN:  Are you having pain? Yes: NPRS scale: mild/10 Pain location: low back Pain description: sore Aggravating factors: bending, sitting wrong  Relieving factors: tylenol    PRECAUTIONS: Fall and Other: currently in treatment for lung cancer  RED FLAGS: None   WEIGHT BEARING RESTRICTIONS: No  FALLS: Has patient fallen in last 6 months? No  LIVING ENVIRONMENT: Lives with: lives alone Lives in: House/apartment Stairs: 1 step to get into home Has following equipment at home: Single point cane, Environmental consultant - 4 wheeled, Wheelchair (manual), and shower chair  PLOF: Independent with household mobility with device and Independent with community mobility with device  PATIENT GOALS: Still need to work on balance, endurance, and would like to work on cane more.  OBJECTIVE:     TODAY'S TREATMENT: 12/09/23 Activity Comments  Stretching to address LB soreness at start of session: LTR Figure 4 stretch  Open book stretch To tolerance. Pt reported some hip pain lying on the R side and required modification to open book stretch   Side step ups on foam Weaned to light UE support; cueing for safe foot placement   suitcase carry gait with 5# 2ft with cane, then without cane CGA; slightly more instability without AD  gait + holding box(4 step) in B hands 57ft Good stability; c/o fatigue        PATIENT EDUCATION: Education details: showed pt how to find [employer]; encouraged her to perform 10 minutes of [facility] youtube video since she will be missing a week of PT; answered pt's questions on fall risk category according to balance testing  Person educated: Patient Education method: Explanation, Demonstration, Tactile cues, Verbal cues, and Handouts Education comprehension: verbalized understanding     HOME EXERCISE PROGRAM: Access Code: [email or web address] URL: [email or web address] Date:  11/18/2023 Prepared by: [facility] - Outpatient  Rehab - [facility]  Program Notes practice walking inside the house with your cane for 5 minutes per day.   Exercises - Seated Hamstring Curls with Resistance  - 1 x daily - 5 x weekly - 3 sets - 10 reps - Seated Knee Extension with Resistance  - 1 x daily - 5 x weekly - 3 sets - 10 reps - Seated Hip Abduction with Resistance  - 1 x daily - 5 x weekly - 2 sets - 10 reps - Side Stepping with Counter Support  - 1 x daily - 5 x weekly - 2-3 sets - 10 reps - Alternating Step Taps with Counter Support  - 1 x daily - 5 x weekly - 3 sets - 10 reps - Backward Walking with Counter Support  - 1 x daily - 5 x weekly - 1 sets - 5 reps - Squat with Chair Touch  - 1 x daily - 5 x weekly - 2 sets - 10 reps - Corner Balance Feet Together With Eyes Open  -  1 x daily - 5 x weekly - 2 sets - 30 sec hold - Corner Balance Feet Apart: Eyes Open With Head Turns  - 1 x daily - 5 x weekly - 2 sets - 30 sec hold - Romberg Stance with Head Nods  - 1 x daily - 5 x weekly - 2 sets - 30 sec hold - Stride Stance Weight Shift  - 1-2 x daily - 7 x weekly - 1-2 sets - 10 reps - Standing Gastroc Stretch on Step  - 1 x daily - 7 x weekly - 1 sets - 3 reps - 15-30 sec hold - Seated Figure 4 Piriformis Stretch  - 1 x daily - 7 x weekly - 1 sets - 3 reps - 15 sec hold       Note: Objective measures were completed at Evaluation unless otherwise noted.  DIAGNOSTIC FINDINGS: NA for this episode  COGNITION: Overall cognitive status: Within functional limits for tasks assessed   SENSATION: Light touch: WFL Sometimes reports pressure/heaviness in feet  MUSCLE TONE: WFL BLEs   POSTURE: rounded shoulders and forward head  LOWER EXTREMITY ROM:   ACTIVE ROM WFL  LOWER EXTREMITY MMT:    MMT Right Eval Left Eval  Hip flexion 3+ 3+  Hip extension    Hip abduction 4 4  Hip adduction 4 4  Hip internal rotation    Hip external rotation    Knee flexion 3+ 4  Knee  extension 4 4  Ankle dorsiflexion 3+ 3+  Ankle plantarflexion    Ankle inversion    Ankle eversion    (Blank rows = not tested)    TRANSFERS: Assistive device utilized: Environmental consultant - 4 wheeled  Sit to stand: Modified independence Stand to sit: Modified independence   GAIT: Gait pattern: step through pattern, decreased step length- Right, decreased stride length, and wide BOS Distance walked: 50 ft x 2 Assistive device utilized: Single point cane and Walker - 4 wheeled Level of assistance: SBA and CGA Comments: Pt brought in her cane (round tip base with SPC)  FUNCTIONAL TESTS:  5 times sit to stand: 13.63 sec arms crossed at chest Timed up and go (TUG): 17.37 with rollator; 20.91 sec with cane  10 meter walk test: 18.97 sec with rollator (1.73 ft/sec); 27.19 sec with cane (1.21 f/tsec) Lars Balance Scale: 32/56                                                                                                                                         GOALS: Goals reviewed with patient? Yes  SHORT TERM GOALS: Target date: 09/12/2023  Pt will be independent with HEP for improved strength, balance, gait. Baseline: reports some days better than others Goal status: MET 09/22/23  2.  Pt will improve 5x sit<>stand to less than or equal to 12 sec to demonstrate improved functional strength and transfer efficiency. Baseline:  13.63 sec; 14.49 sec without UE support 09/22/23 Goal status: NOT MET 09/22/23  3.  Pt will improve Berg score to at least 38/56 to decrease fall risk. Baseline: 32/56; 49/56 09/22/23 Goal status: MET 09/22/23  4.  Pt will improve gait velocity to at least 2 ft/sec with rollator for improved gait efficiency and safety. Baseline: 1.72 ft/sec; 2.1 ft/sec 09/22/23 Goal status: MET 09/22/23  LONG TERM GOALS: Target date: 10/10/2023>UPDATED TARGET 11/21/2023>UPDATED TARGET 12/26/2023  Pt will be independent with progression of HEP for improved strength, balance,  gait. Baseline: continues to be updated Goal status: IN PROGRESS  11/20/2023  2.  Pt will improve TUG score to less than or equal to 13.5 sec for decreased fall risk. Baseline: 15.9 sec cane 11/20/2023 Goal status: IN PROGRESS 11/20/2023  3.  Pt will improve Berg score to at least 45/56 to decrease fall risk. Baseline: 32/56; 49/56 09/22/23 Goal status: MET 09/22/23  4.  Pt will improve gait velocity to at least 1.8 ft/sec with cane for improved gait efficiency and safety.   Baseline: 1.58 ft/sec with cane 09/22/23, 1.75 ft/sec with cane 10/10/2023; 2.05 ft/sec 11/11/2023 Goal status: MET 11/11/2023  5.  Pt will verbalize continued community fitness upon d/c from PT. Baseline: have provided education, not yet started Goal status: IN PROGRESS 11/20/2023  6. Pt will improve DGI score to at least 18/24 to decrease fall risk.  Baseline:  11/24>15/24 11/20/2023  Goal status:  IN PROGRESS 11/20/2023  7.  Pt will negotiate single curb step with cane and supervision for improved community mobility.  Baseline:  CGA 11/20/2023  Goal status:  IN PROGRESS, 11/20/2023  8.  Pt will improve gait velocity to at least 2.3 ft/sec for improved gait efficiency and safety.  Baseline:  2.07 ft/sec  Goal status:  INITIAL, 11/20/2023    9.  Pt will perform Condition 4 on MCTSIB 30 sec with mod sway or better for improved balance.    Baseline:  19 sec    Goal status:  INITIAL 11/20/2023  ASSESSMENT:  CLINICAL IMPRESSION: Patient arrived to session with report of mild LBP. Addressed with gentle stretching at start of session. Balance training involved compliant surface, carrying items, and head turns. Patient demonstrated instability and required use of UE support on uneven surface and when carrying item in 1 hand. Patient reported some fatigue with carrying activities, otherwise without complaints upon leaving.   OBJECTIVE IMPAIRMENTS: Abnormal gait, decreased balance, decreased mobility, difficulty walking, and  decreased strength.   ACTIVITY LIMITATIONS: bending, standing, squatting, transfers, bed mobility, and locomotion level  PARTICIPATION LIMITATIONS: driving, shopping, and community activity  PERSONAL FACTORS: 3+ comorbidities: see above are also affecting patient's functional outcome.   REHAB POTENTIAL: Good  CLINICAL DECISION MAKING: Evolving/moderate complexity  EVALUATION COMPLEXITY: Moderate  PLAN:  PT FREQUENCY: 2x/week  PT DURATION: 6 weeks per recert 10/10/2023  PLANNED INTERVENTIONS: 97750- Physical Performance Testing, 97110-Therapeutic exercises, 97530- Therapeutic activity, 97112- Neuromuscular re-education, 97535- Self Care, [id number]- Manual therapy, [phone]- Gait training, Patient/Family education, and Balance training  PLAN FOR NEXT SESSION: Dynamic balance activities.  Ask about YouTube videos/AHOY.  Multi-sensory balance; continue working on gait with cane; curb/single step negotiation, walking in and out of clinic with cane.  Consider incorporating UE exercises into HEP.    Louana Terrilyn Christians, PT, DPT 12/09/23 11:00 AM  [facility] at [facility] [address] [place], [address] [place], [place] [address] Phone # [phone] Fax # [phone]

## 2023-12-09 ENCOUNTER — Encounter: Payer: Self-pay | Admitting: Physical Therapy

## 2023-12-09 ENCOUNTER — Ambulatory Visit: Attending: Family Medicine | Admitting: Physical Therapy

## 2023-12-09 DIAGNOSIS — R262 Difficulty in walking, not elsewhere classified: Secondary | ICD-10-CM | POA: Insufficient documentation

## 2023-12-09 DIAGNOSIS — R2681 Unsteadiness on feet: Secondary | ICD-10-CM | POA: Insufficient documentation

## 2023-12-09 DIAGNOSIS — R29898 Other symptoms and signs involving the musculoskeletal system: Secondary | ICD-10-CM | POA: Diagnosis not present

## 2023-12-09 DIAGNOSIS — R2689 Other abnormalities of gait and mobility: Secondary | ICD-10-CM | POA: Diagnosis not present

## 2023-12-09 DIAGNOSIS — M6281 Muscle weakness (generalized): Secondary | ICD-10-CM | POA: Insufficient documentation

## 2023-12-09 NOTE — Patient Instructions (Signed)
 Google [employer] [place] and click on the 1st link. Scroll down on the page and click on [employer] [place] YouTube channel offers recordings of several different active adult classes. This will lead you to the Youtube video of an exercise class.

## 2023-12-11 ENCOUNTER — Inpatient Hospital Stay (HOSPITAL_BASED_OUTPATIENT_CLINIC_OR_DEPARTMENT_OTHER): Admitting: Internal Medicine

## 2023-12-11 ENCOUNTER — Inpatient Hospital Stay

## 2023-12-11 ENCOUNTER — Other Ambulatory Visit: Payer: Self-pay

## 2023-12-11 ENCOUNTER — Inpatient Hospital Stay: Attending: Internal Medicine

## 2023-12-11 VITALS — BP 129/88 | HR 119 | Temp 98.5°F | Resp 17 | Ht 63.0 in | Wt 162.0 lb

## 2023-12-11 DIAGNOSIS — R634 Abnormal weight loss: Secondary | ICD-10-CM | POA: Diagnosis not present

## 2023-12-11 DIAGNOSIS — C787 Secondary malignant neoplasm of liver and intrahepatic bile duct: Secondary | ICD-10-CM | POA: Diagnosis not present

## 2023-12-11 DIAGNOSIS — Z5111 Encounter for antineoplastic chemotherapy: Secondary | ICD-10-CM | POA: Diagnosis not present

## 2023-12-11 DIAGNOSIS — C3491 Malignant neoplasm of unspecified part of right bronchus or lung: Secondary | ICD-10-CM

## 2023-12-11 DIAGNOSIS — E860 Dehydration: Secondary | ICD-10-CM | POA: Insufficient documentation

## 2023-12-11 DIAGNOSIS — C7951 Secondary malignant neoplasm of bone: Secondary | ICD-10-CM | POA: Insufficient documentation

## 2023-12-11 DIAGNOSIS — C3431 Malignant neoplasm of lower lobe, right bronchus or lung: Secondary | ICD-10-CM | POA: Diagnosis not present

## 2023-12-11 LAB — CBC WITH DIFFERENTIAL (CANCER CENTER ONLY)
Abs Immature Granulocytes: 0.03 10*3/uL (ref 0.00–0.07)
Basophils Absolute: 0 10*3/uL (ref 0.0–0.1)
Basophils Relative: 1 %
Eosinophils Absolute: 0.1 10*3/uL (ref 0.0–0.5)
Eosinophils Relative: 1 %
HCT: 40.3 % (ref 36.0–46.0)
Hemoglobin: 13.4 g/dL (ref 12.0–15.0)
Immature Granulocytes: 1 %
Lymphocytes Relative: 7 %
Lymphs Abs: 0.5 10*3/uL — ABNORMAL LOW (ref 0.7–4.0)
MCH: 29 pg (ref 26.0–34.0)
MCHC: 33.3 g/dL (ref 30.0–36.0)
MCV: 87.2 fL (ref 80.0–100.0)
Monocytes Absolute: 0.5 10*3/uL (ref 0.1–1.0)
Monocytes Relative: 8 %
Neutro Abs: 5.1 10*3/uL (ref 1.7–7.7)
Neutrophils Relative %: 82 %
Platelet Count: 257 10*3/uL (ref 150–400)
RBC: 4.62 MIL/uL (ref 3.87–5.11)
RDW: 13.4 % (ref 11.5–15.5)
WBC Count: 6.1 10*3/uL (ref 4.0–10.5)
nRBC: 0 % (ref 0.0–0.2)

## 2023-12-11 LAB — CMP (CANCER CENTER ONLY)
ALT: 9 U/L (ref 0–44)
AST: 16 U/L (ref 15–41)
Albumin: 3.9 g/dL (ref 3.5–5.0)
Alkaline Phosphatase: 82 U/L (ref 38–126)
Anion gap: 9 (ref 5–15)
BUN: 10 mg/dL (ref 8–23)
CO2: 29 mmol/L (ref 22–32)
Calcium: 9.2 mg/dL (ref 8.9–10.3)
Chloride: 103 mmol/L (ref 98–111)
Creatinine: 0.77 mg/dL (ref 0.44–1.00)
GFR, Estimated: >60 mL/min (ref >60)
Glucose, Bld: 132 mg/dL — ABNORMAL HIGH (ref 70–99)
Potassium: 3.4 mmol/L — ABNORMAL LOW (ref 3.5–5.1)
Sodium: 141 mmol/L (ref 135–145)
Total Bilirubin: 0.5 mg/dL (ref 0.0–1.2)
Total Protein: 6.8 g/dL (ref 6.5–8.1)

## 2023-12-11 LAB — TSH: TSH: 2.15 u[IU]/mL (ref 0.350–4.500)

## 2023-12-11 MED ORDER — SODIUM CHLORIDE 0.9 % IV SOLN: Freq: Once | INTRAVENOUS | Status: AC

## 2023-12-11 MED ORDER — PROCHLORPERAZINE MALEATE 10 MG PO TABS
10.0000 mg | ORAL_TABLET | Freq: Once | ORAL | Status: AC
  Administered 2023-12-11: 10 mg via ORAL
  Filled 2023-12-11: qty 1

## 2023-12-11 MED ORDER — DEXAMETHASONE SODIUM PHOSPHATE 10 MG/ML IJ SOLN
10.0000 mg | Freq: Once | INTRAMUSCULAR | Status: AC
  Administered 2023-12-11: 10 mg via INTRAVENOUS
  Filled 2023-12-11: qty 1

## 2023-12-11 MED ORDER — CYANOCOBALAMIN 1000 MCG/ML IJ SOLN
1000.0000 ug | Freq: Once | INTRAMUSCULAR | Status: AC
  Administered 2023-12-11: 1000 ug via INTRAMUSCULAR
  Filled 2023-12-11: qty 1

## 2023-12-11 MED ORDER — SODIUM CHLORIDE 0.9 % IV SOLN
500.0000 mg/m2 | Freq: Once | INTRAVENOUS | Status: AC
  Administered 2023-12-11: 900 mg via INTRAVENOUS
  Filled 2023-12-11: qty 20

## 2023-12-11 NOTE — Progress Notes (Signed)
 [facility] [phone]   [phone]  OFFICE PROGRESS NOTE  Wu, Hayley POUR, FNP [address] [place] [address]  DIAGNOSIS: Stage IV (T1b, N2, M1 C) non-small cell lung cancer, adenocarcinoma presented with right lower lobe lung nodule in addition to small bilateral pulmonary nodules and right hilar and mediastinal lymphadenopathy in addition to liver and bone metastasis diagnosed in April 2024. 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   PDL1: 20%   Guardant 360: no actionable mutations.    PRIOR THERAPY: 1) Decompressive thoracic laminectomy of the T7 with spinal cord decompression and biopsy of the epidural mass in April 2024.  2) Palliative radiation to the bone under the care of Dr. Izell. Last day scheduled for 10/25/22    CURRENT THERAPY: Carboplatin  for an AUC of 5, Alimta 500 mg/m, Keytruda  200 mg IV every 3 weeks.  First dose expected on 10/28/2022. Status post 17 cycles.  Starting from cycle #5 the patient is on maintenance treatment with Alimta and Keytruda  every 3 weeks.  Starting from cycle #7 the patient will be treated with single agent Alimta 500 Mg/M2 every 3 weeks.  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  INTERVAL HISTORY: Hayley Wu 73 y.o. female returns to the clinic today for follow-up visit.  Discussed the use of AI scribe software for clinical note transcription with the patient, who gave verbal consent to proceed.  History of Present Illness Hayley Wu is a 73 year old female with adenocarcinoma who presents for evaluation before starting cycle number eighteen of chemotherapy. She is accompanied by her daughter, Hayley Wu.  Diagnosed with adenocarcinoma in April 2024, she began chemotherapy with carboplatin , pemetrexed , and pembrolizumab  every three weeks.  She is currently preparing to start her eighteenth cycle of treatment.  She experiences increased fatigue since her last treatment, with the fatigue persisting longer than the typical three to four days post-treatment. Additionally, she has a decreased interest in eating.  She consumes about eight cups of coffee throughout the day and acknowledges that this may not be sufficient for hydration. She feels dehydrated today.  Her heart rate is consistently over 100, which is typical for her.     MEDICAL HISTORY: Past Medical History:  Diagnosis Date   Allergy    Contact lens/glasses fitting    Hypertension     ALLERGIES:  is allergic to erythromycin, amoxicillin, latex, and prednisone .  MEDICATIONS:  Current Outpatient Medications  Medication Sig Dispense Refill   acetaminophen  (TYLENOL ) 500 MG tablet Take 500 mg by mouth every 6 (six) hours as needed for moderate pain.     amLODipine  (NORVASC ) 10 MG tablet Take 10 mg by mouth daily.     apixaban  (ELIQUIS ) 5 MG TABS tablet Take 1 tablet (5 mg total) by mouth 2 (two) times daily. 60 tablet 2   folic acid  (FOLVITE ) 1 MG tablet Take 1 tablet (1 mg total) by mouth daily. 90 tablet 1   Ipratropium-Albuterol  (COMBIVENT ) 20-100 MCG/ACT AERS respimat Inhale 1 puff into the lungs every 6 (six) hours as needed for wheezing or shortness of breath. 4 g 1   levothyroxine  (SYNTHROID ) 112 MCG tablet Take 112 mcg by mouth daily before breakfast.     lidocaine -prilocaine  (EMLA ) cream Apply 1 Application topically as needed. 30 g 2   mirtazapine  (REMERON ) 30 MG tablet TAKE  1 TABLET BY MOUTH AT BEDTIME 90 tablet 0   potassium chloride  SA (KLOR-CON  M) 20 MEQ tablet Take 1 tablet (20 mEq total) by mouth daily. (Patient not taking: Reported on 10/29/2023) 6 tablet 0   prochlorperazine  (COMPAZINE ) 10 MG tablet Take 1 tablet (10 mg total) by mouth every 6 (six) hours as needed. (Patient taking differently: Take 10 mg by mouth every 6 (six) hours as needed for  refractory nausea / vomiting.) 30 tablet 2   VITAMIN D PO Take 1 tablet by mouth daily.     No current facility-administered medications for this visit.    SURGICAL HISTORY:  Past Surgical History:  Procedure Laterality Date   ABDOMINAL HYSTERECTOMY     BREAST SURGERY  2010   rt lumpectomy-neg   IR IMAGING GUIDED PORT INSERTION  12/18/2022   LAMINECTOMY N/A 09/21/2022   Procedure: THORACIC LAMINECTOMY FOR TUMOR;  Surgeon: Joshua Alm RAMAN, MD;  Location: [facility] OR;  Service: Neurosurgery;  Laterality: N/A;   OPEN REDUCTION INTERNAL FIXATION (ORIF) DISTAL RADIAL FRACTURE Left 11/26/2013   Procedure: OPEN REDUCTION INTERNAL FIXATION (ORIF) LEFT  DISTAL RADIUS ;  Surgeon: Dempsey JINNY Sensor, MD;  Location: [facility];  Service: Orthopedics;  Laterality: Left;  with block   TUBAL LIGATION      REVIEW OF SYSTEMS:  A comprehensive review of systems was negative except for: Constitutional: positive for fatigue   PHYSICAL EXAMINATION: General appearance: alert, cooperative, fatigued, and no distress Head: Normocephalic, without obvious abnormality, atraumatic Neck: no adenopathy, no JVD, supple, symmetrical, trachea midline, and thyroid  not enlarged, symmetric, no tenderness/mass/nodules Lymph nodes: Cervical, supraclavicular, and axillary nodes normal. Resp: clear to auscultation bilaterally Back: symmetric, no curvature. ROM normal. No CVA tenderness. Cardio: regular rate and rhythm, S1, S2 normal, no murmur, click, rub or gallop GI: soft, non-tender; bowel sounds normal; no masses,  no organomegaly Extremities: extremities normal, atraumatic, no cyanosis or edema  ECOG PERFORMANCE STATUS: 1 - Symptomatic but completely ambulatory  Blood pressure 129/88, pulse (!) 119, temperature 98.5 F (36.9 C), temperature source Temporal, resp. rate 17, height 5' 3 (1.6 m), weight 162 lb (73.5 kg), SpO2 94%.  LABORATORY DATA: Lab Results  Component Value Date   WBC 6.1 12/11/2023   HGB 13.4  12/11/2023   HCT 40.3 12/11/2023   MCV 87.2 12/11/2023   PLT 257 12/11/2023      Chemistry      Component Value Date/Time   NA 141 11/19/2023 1304   K 3.8 11/19/2023 1304   CL 106 11/19/2023 1304   CO2 28 11/19/2023 1304   BUN 16 11/19/2023 1304   CREATININE 0.78 11/19/2023 1304      Component Value Date/Time   CALCIUM 9.1 11/19/2023 1304   ALKPHOS 93 11/19/2023 1304   AST 16 11/19/2023 1304   ALT 10 11/19/2023 1304   BILITOT 0.4 11/19/2023 1304       RADIOGRAPHIC STUDIES: CT CHEST ABDOMEN PELVIS W CONTRAST Result Date: 11/13/2023 CLINICAL DATA:  Non-small cell lung cancer restaging * Tracking Code: BO * EXAM: CT CHEST, ABDOMEN, AND PELVIS WITH CONTRAST TECHNIQUE: Multidetector CT imaging of the chest, abdomen and pelvis was performed following the standard protocol during bolus administration of intravenous contrast. RADIATION DOSE REDUCTION: This exam was performed according to the departmental dose-optimization program which includes automated exposure control, adjustment of the mA and/or kV according to patient size and/or use of iterative reconstruction technique. CONTRAST:  OMNIPAQUE  IOHEXOL  300 MG/ML  SOLN COMPARISON:  08/20/2023 FINDINGS:  CT CHEST FINDINGS Cardiovascular: Right chest port catheter. Aortic atherosclerosis. Normal heart size. Left and right coronary artery calcifications. Unchanged moderate pericardial effusion. Mediastinum/Nodes: No enlarged mediastinal, hilar, or axillary lymph nodes. Thyroid  gland, trachea, and esophagus demonstrate no significant findings. Lungs/Pleura: Moderate centrilobular and paraseptal emphysema. Unchanged, bandlike paramedian scarring and fibrotic volume loss, more conspicuous in the right lung (series 7, image 53). Unchanged spiculated nodule of the right lower lobe measuring 1.4 x 1.1 cm (series 7, image 71). Unchanged spiculated nodule of the right upper lobe measuring 0.7 cm (series 7, image 30). Similar small, loculated right  pleural effusion. Musculoskeletal: No chest wall abnormality. No acute osseous findings. CT ABDOMEN PELVIS FINDINGS Hepatobiliary: No solid liver abnormality is seen. Multiple cysts, particularly in the left lobe of the liver, benign, requiring no further follow-up or characterization (series 2, image 43). Large, faintly calcified gallstone. No gallbladder wall thickening. Unchanged intra and extrahepatic biliary ductal dilatation, common hepatic duct measuring up to 1.4 cm (series 2, image 54). Pancreas: Unremarkable. No pancreatic ductal dilatation or surrounding inflammatory changes. Spleen: Normal in size without significant abnormality. Adrenals/Urinary Tract: Adrenal glands are unremarkable. Kidneys are normal, without renal calculi, solid lesion, or hydronephrosis. Bladder is unremarkable. Stomach/Bowel: Stomach is within normal limits. Appendix appears normal. No evidence of bowel wall thickening, distention, or inflammatory changes. Sigmoid diverticulosis. Vascular/Lymphatic: Aortic atherosclerosis. No enlarged abdominal or pelvic lymph nodes. Reproductive: Status post hysterectomy. Other: No abdominal wall hernia or abnormality. No ascites. Musculoskeletal: No acute osseous findings. 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 IMPRESSION: 1. Unchanged post treatment/post radiation appearance of the chest. Unchanged spiculated right upper and right lower lobe pulmonary nodules. Similar small, loculated right pleural effusion. 2. 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 3. No evidence of lymphadenopathy or soft tissue metastatic  disease in the abdomen or pelvis. 4. Unchanged moderate pericardial effusion. 5. Cholelithiasis with unchanged intra and extrahepatic biliary ductal dilatation but without ductal calculus or other obstructing etiology visible. Correlate for clinical evidence of biliary obstruction. 6. Emphysema. 7. Coronary artery disease. Aortic Atherosclerosis (ICD10-I70.0) and Emphysema (ICD10-J43.9). Electronically Signed   By: Marolyn JONETTA Jaksch M.D.   On: 11/13/2023 14:18     ASSESSMENT AND PLAN: This is a very pleasant 73 years old white female with Stage IV (T1b, N2, M1 C) non-small cell lung cancer, adenocarcinoma presented with right lower lobe lung nodule in addition to small bilateral pulmonary nodules and right hilar and mediastinal lymphadenopathy in addition to liver and bone metastasis diagnosed in April 2024. 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 Molecular studies showed no actionable mutations and PD-L1 expression of 20%. 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  She also had palliative radiation to the bone under the care of Dr. Izell. Last day scheduled for 10/25/22. She is currently on palliative systemic chemotherapy with carboplatin  for AUC of 5, Alimta 500 Mg/M2 and Keytruda  200 Mg IV every 3 weeks status post 17 cycles.  Starting from cycle #5 the patient is on maintenance treatment with Alimta and Keytruda   every 3 weeks.  After cycle #6 Keytruda  was discontinued secondary to suspicious immunotherapy mediated pneumonitis with recent hospitalization and acute respiratory failure. She continues to tolerate her treatment with single agent Alimta fairly well. Assessment and Plan Assessment & Plan Malignant neoplasm of breast Diagnosed with breast adenocarcinoma in April 2023. Currently undergoing  palliative chemotherapy with carboplatin , pemetrexed , and pembrolizumab . She is on cycle 18 of treatment. Reports increased fatigue post-treatment, which is more prolonged than usual. - Proceed with cycle 18 of chemotherapy as blood work is adequate. - Schedule follow-up in three weeks to reassess treatment response and manage any side effects.  Dehydration Mild dehydration likely exacerbated by recent heat and humidity. Heart rate elevated at 119 bpm, possibly related to dehydration. Blood pressure is 129/88 mmHg. Reports increased fatigue and decreased interest in eating since last treatment cycle, which may also contribute to dehydration. - Administer half a liter of IV fluids to improve hydration and heart rate. - Advise to hydrate with fluids other than water, such as juice, and reduce coffee intake. - Suggest setting reminders to drink fluids regularly. The patient was advised to call immediately if she has any other concerning symptoms in the interval.  The patient voices understanding of current disease status and treatment options and is in agreement with the current care plan.  All questions were answered. The patient knows to call the clinic with any problems, questions or concerns. We can certainly see the patient much sooner if necessary.  The total time spent in the appointment was 20 minutes.  Disclaimer: This note was dictated with voice recognition software. Similar sounding words can inadvertently be transcribed and may not be corrected upon review.

## 2023-12-12 LAB — T4: T4, Total: 14.7 ug/dL — ABNORMAL HIGH (ref 4.5–12.0)

## 2023-12-16 ENCOUNTER — Encounter: Payer: Self-pay | Admitting: Physical Therapy

## 2023-12-16 ENCOUNTER — Ambulatory Visit: Admitting: Physical Therapy

## 2023-12-16 DIAGNOSIS — R2681 Unsteadiness on feet: Secondary | ICD-10-CM

## 2023-12-16 DIAGNOSIS — R2689 Other abnormalities of gait and mobility: Secondary | ICD-10-CM | POA: Diagnosis not present

## 2023-12-16 NOTE — Therapy (Signed)
 OUTPATIENT PHYSICAL THERAPY NEURO TREATMENT   Patient Name: Hayley Wu MRN: [id number] DOB:11-Feb-1951, 73 y.o., female Today's Date: 12/16/2023   PCP: Lanis Thresa JAYSON DEVONNA REFERRING PROVIDER: Crecencio [name], FNP      END OF SESSION:  PT End of Session - 12/16/23 1018     Visit Number 27    Number of Visits 29    Date for PT Re-Evaluation 12/26/23    Authorization Type [employer]    Progress Note Due on Visit 30    PT Start Time 1019    PT Stop Time 1058    PT Time Calculation (min) 39 min    Equipment Utilized During Treatment Gait belt    Activity Tolerance Patient tolerated treatment well    Behavior During Therapy WFL for tasks assessed/performed                            Past Medical History:  Diagnosis Date   Allergy    Contact lens/glasses fitting    Hypertension    Past Surgical History:  Procedure Laterality Date   ABDOMINAL HYSTERECTOMY     BREAST SURGERY  2010   rt lumpectomy-neg   IR IMAGING GUIDED PORT INSERTION  12/18/2022   LAMINECTOMY N/A 09/21/2022   Procedure: THORACIC LAMINECTOMY FOR TUMOR;  Surgeon: Joshua Alm RAMAN, MD;  Location: [facility] OR;  Service: Neurosurgery;  Laterality: N/A;   OPEN REDUCTION INTERNAL FIXATION (ORIF) DISTAL RADIAL FRACTURE Left 11/26/2013   Procedure: OPEN REDUCTION INTERNAL FIXATION (ORIF) LEFT  DISTAL RADIUS ;  Surgeon: Dempsey JINNY Sensor, MD;  Location: [facility];  Service: Orthopedics;  Laterality: Left;  with block   TUBAL LIGATION     Patient Active Problem List   Diagnosis Date Noted   Acute deep vein thrombosis (DVT) of femoral vein of both lower extremities (HCC) 03/19/2023   Normocytic anemia 03/01/2023   Moderate protein malnutrition (HCC) 03/01/2023   Acute respiratory failure with hypoxia (HCC) 03/01/2023   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   Cardiomegaly 03/01/2023   Pleural effusion due to CHF (congestive heart failure) (HCC) 03/01/2023    Port-A-Cath in place 12/30/2022   Encounter for antineoplastic immunotherapy 11/18/2022   Encounter for antineoplastic chemotherapy 11/18/2022   Constipation 11/05/2022   Goals of care, counseling/discussion 10/21/2022   Cancer, metastatic to bone (HCC) 10/04/2022   Adenocarcinoma of right lung, stage 4 (HCC) 10/01/2022   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 09/20/2022   Hypokalemia 09/20/2022   Lung mass 09/20/2022   Hypothyroidism 09/20/2022   Cholelithiasis 09/20/2022   Lumbar spondylosis 08/26/2022   Thoracic back pain 04/04/2022   Low back pain 04/04/2022   Hypertension 09/23/2011    ONSET DATE: 08/08/2023 (MD order)  REFERRING DIAG:  Z91.81 (ICD-10-CM) - History of falling  C34.91 (ICD-10-CM) - Malignant neoplasm of unspecified part of right bronchus or lung    THERAPY DIAG:  Other abnormalities of gait and mobility  Unsteadiness on feet  Rationale for Evaluation and Treatment: Rehabilitation  SUBJECTIVE:  SUBJECTIVE STATEMENT: Had chemo on Thursday, and it's just not agreeing with me this time.    Pt accompanied by: self  PERTINENT HISTORY: Stage IV (T1b, N2, M1 C) non-small cell lung cancer, adenocarcinoma presented with right lower lobe lung nodule in addition to small bilateral pulmonary nodules and right hilar and mediastinal lymphadenopathy in addition to liver and bone metastasis diagnosed in April 2024. 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    PAIN:  Are you having pain? Yes: NPRS scale: mild/10 Pain location: low back Pain  description: sore Aggravating factors: bending, sitting wrong  Relieving factors: tylenol    PRECAUTIONS: Fall and Other: currently in treatment for lung cancer  RED FLAGS: None   WEIGHT BEARING RESTRICTIONS: No  FALLS: Has patient fallen in last 6 months? No  LIVING ENVIRONMENT: Lives with: lives alone Lives in: House/apartment Stairs: 1 step to get into home Has following equipment at home: Single point cane, Environmental consultant - 4 wheeled, Wheelchair (manual), and shower chair  PLOF: Independent with household mobility with device and Independent with community mobility with device  PATIENT GOALS: Still need to work on balance, endurance, and would like to work on cane more.  OBJECTIVE:    TODAY'S TREATMENT: 12/16/2023 Activity Comments  Balance ex: Romberg + EO/EC + head turns/nods Airex + feet apart + EO/EC + head turns/nods Airex + Romberg + EO/EC 30 sec Increased sway EC  Vitals:  HR 119, O2 sats 89-93%   Deep breathing/pursed lip breathing exercises   Monster walk, 3 reps   Resisted sidestepping 2 reps at counter Green band  HR 121 bpm, O2 sats 91%   Stair negotiation 3 reps Cane and intermittent handrail, step to pattern  Gait between activities with cane, mod I       PATIENT EDUCATION: Education details: Try to walk at home 2-3 minutes several times/day; monitor O2 sats and contact MD if any symptoms that worsen in regards to coughing/breathing Person educated: Patient Education method: Explanation, Demonstration, Tactile cues, Verbal cues, and Handouts Education comprehension: verbalized understanding     HOME EXERCISE PROGRAM: Access Code: [email or web address] URL: [email or web address] Date: 11/18/2023 Prepared by: [facility] - [facility]  Program Notes practice walking inside the house with your cane for 5 minutes per day.   Exercises - Seated Hamstring Curls with Resistance  - 1 x daily - 5 x weekly - 3 sets - 10 reps - Seated  Knee Extension with Resistance  - 1 x daily - 5 x weekly - 3 sets - 10 reps - Seated Hip Abduction with Resistance  - 1 x daily - 5 x weekly - 2 sets - 10 reps - Side Stepping with Counter Support  - 1 x daily - 5 x weekly - 2-3 sets - 10 reps - Alternating Step Taps with Counter Support  - 1 x daily - 5 x weekly - 3 sets - 10 reps - Backward Walking with Counter Support  - 1 x daily - 5 x weekly - 1 sets - 5 reps - Squat with Chair Touch  - 1 x daily - 5 x weekly - 2 sets - 10 reps - Corner Balance Feet Together With Eyes Open  - 1 x daily - 5 x weekly - 2 sets - 30 sec hold - Corner Balance Feet Apart: Eyes Open With Head Turns  - 1 x daily - 5  x weekly - 2 sets - 30 sec hold - Romberg Stance with Head Nods  - 1 x daily - 5 x weekly - 2 sets - 30 sec hold - Stride Stance Weight Shift  - 1-2 x daily - 7 x weekly - 1-2 sets - 10 reps - Standing Gastroc Stretch on Step  - 1 x daily - 7 x weekly - 1 sets - 3 reps - 15-30 sec hold - Seated Figure 4 Piriformis Stretch  - 1 x daily - 7 x weekly - 1 sets - 3 reps - 15 sec hold       Note: Objective measures were completed at Evaluation unless otherwise noted.  DIAGNOSTIC FINDINGS: NA for this episode  COGNITION: Overall cognitive status: Within functional limits for tasks assessed   SENSATION: Light touch: WFL Sometimes reports pressure/heaviness in feet  MUSCLE TONE: WFL BLEs   POSTURE: rounded shoulders and forward head  LOWER EXTREMITY ROM:   ACTIVE ROM WFL  LOWER EXTREMITY MMT:    MMT Right Eval Left Eval  Hip flexion 3+ 3+  Hip extension    Hip abduction 4 4  Hip adduction 4 4  Hip internal rotation    Hip external rotation    Knee flexion 3+ 4  Knee extension 4 4  Ankle dorsiflexion 3+ 3+  Ankle plantarflexion    Ankle inversion    Ankle eversion    (Blank rows = not tested)    TRANSFERS: Assistive device utilized: Environmental consultant - 4 wheeled  Sit to stand: Modified independence Stand to sit: Modified  independence   GAIT: Gait pattern: step through pattern, decreased step length- Right, decreased stride length, and wide BOS Distance walked: 50 ft x 2 Assistive device utilized: Single point cane and Walker - 4 wheeled Level of assistance: SBA and CGA Comments: Pt brought in her cane (round tip base with SPC)  FUNCTIONAL TESTS:  5 times sit to stand: 13.63 sec arms crossed at chest Timed up and go (TUG): 17.37 with rollator; 20.91 sec with cane  10 meter walk test: 18.97 sec with rollator (1.73 ft/sec); 27.19 sec with cane (1.21 f/tsec) Lars Balance Scale: 32/56                                                                                                                                         GOALS: Goals reviewed with patient? Yes  SHORT TERM GOALS: Target date: 09/12/2023  Pt will be independent with HEP for improved strength, balance, gait. Baseline: reports some days better than others Goal status: MET 09/22/23  2.  Pt will improve 5x sit<>stand to less than or equal to 12 sec to demonstrate improved functional strength and transfer efficiency. Baseline:  13.63 sec; 14.49 sec without UE support 09/22/23 Goal status: NOT MET 09/22/23  3.  Pt will improve Berg score to at least 38/56 to decrease fall risk. Baseline: 32/56;  49/56 09/22/23 Goal status: MET 09/22/23  4.  Pt will improve gait velocity to at least 2 ft/sec with rollator for improved gait efficiency and safety. Baseline: 1.72 ft/sec; 2.1 ft/sec 09/22/23 Goal status: MET 09/22/23  LONG TERM GOALS: Target date: 10/10/2023>UPDATED TARGET 11/21/2023>UPDATED TARGET 12/26/2023  Pt will be independent with progression of HEP for improved strength, balance, gait. Baseline: continues to be updated Goal status: IN PROGRESS  11/20/2023  2.  Pt will improve TUG score to less than or equal to 13.5 sec for decreased fall risk. Baseline: 15.9 sec cane 11/20/2023 Goal status: IN PROGRESS 11/20/2023  3.  Pt will improve  Berg score to at least 45/56 to decrease fall risk. Baseline: 32/56; 49/56 09/22/23 Goal status: MET 09/22/23  4.  Pt will improve gait velocity to at least 1.8 ft/sec with cane for improved gait efficiency and safety.   Baseline: 1.58 ft/sec with cane 09/22/23, 1.75 ft/sec with cane 10/10/2023; 2.05 ft/sec 11/11/2023 Goal status: MET 11/11/2023  5.  Pt will verbalize continued community fitness upon d/c from PT. Baseline: have provided education, not yet started Goal status: IN PROGRESS 11/20/2023  6. Pt will improve DGI score to at least 18/24 to decrease fall risk.  Baseline:  11/24>15/24 11/20/2023  Goal status:  IN PROGRESS 11/20/2023  7.  Pt will negotiate single curb step with cane and supervision for improved community mobility.  Baseline:  CGA 11/20/2023  Goal status:  IN PROGRESS, 11/20/2023  8.  Pt will improve gait velocity to at least 2.3 ft/sec for improved gait efficiency and safety.  Baseline:  2.07 ft/sec  Goal status:  INITIAL, 11/20/2023    9.  Pt will perform Condition 4 on MCTSIB 30 sec with mod sway or better for improved balance.    Baseline:  19 sec    Goal status:  INITIAL 11/20/2023  ASSESSMENT:  CLINICAL IMPRESSION: Pt presents today and reports having a little more fatigue than last session, due to pt's recent chemo.  Skilled PT session focused on balance exercises today, with pt having continued difficulty with EC.  Worked also on hip stability.  Monitored O2 sats closely, as pt reports more fatigue and more breathing issues since last chemo session; O2 sats run 89-93% throughout. Pt needs several rest breaks; education provided during this time (see above). Pt will continue to benefit from skilled PT towards goals for improved functional mobility and decreased fall risk.   OBJECTIVE IMPAIRMENTS: Abnormal gait, decreased balance, decreased mobility, difficulty walking, and decreased strength.   ACTIVITY LIMITATIONS: bending, standing, squatting, transfers, bed  mobility, and locomotion level  PARTICIPATION LIMITATIONS: driving, shopping, and community activity  PERSONAL FACTORS: 3+ comorbidities: see above are also affecting patient's functional outcome.   REHAB POTENTIAL: Good  CLINICAL DECISION MAKING: Evolving/moderate complexity  EVALUATION COMPLEXITY: Moderate  PLAN:  PT FREQUENCY: 2x/week  PT DURATION: 6 weeks per recert 10/10/2023  PLANNED INTERVENTIONS: 97750- Physical Performance Testing, 97110-Therapeutic exercises, 97530- Therapeutic activity, 97112- Neuromuscular re-education, 97535- Self Care, [id number]- Manual therapy, [phone]- Gait training, Patient/Family education, and Balance training  PLAN FOR NEXT SESSION: Check LTGs and discuss POC; pt wants to continue PT to try to lessen reliance on cane. (Would need recert if she continues)   Greig Anon, PT 12/16/23 11:01 AM Phone: [phone] Fax: [phone]  [facility] at [facility] [address] [place], [address] [place], [place] [address] Phone # [phone] Fax # [phone]

## 2023-12-17 ENCOUNTER — Ambulatory Visit: Admitting: Physical Therapy

## 2023-12-23 ENCOUNTER — Ambulatory Visit

## 2023-12-23 DIAGNOSIS — R262 Difficulty in walking, not elsewhere classified: Secondary | ICD-10-CM

## 2023-12-23 DIAGNOSIS — M6281 Muscle weakness (generalized): Secondary | ICD-10-CM

## 2023-12-23 DIAGNOSIS — R2689 Other abnormalities of gait and mobility: Secondary | ICD-10-CM | POA: Diagnosis not present

## 2023-12-23 DIAGNOSIS — R2681 Unsteadiness on feet: Secondary | ICD-10-CM

## 2023-12-23 DIAGNOSIS — R29898 Other symptoms and signs involving the musculoskeletal system: Secondary | ICD-10-CM

## 2023-12-23 NOTE — Therapy (Signed)
 OUTPATIENT PHYSICAL THERAPY NEURO TREATMENT, Progress Note, and Recertification   Patient Name: Hayley Wu MRN: [id number] DOB:September 27, 1950, 73 y.o., female Today's Date: 12/23/2023   PCP: Lanis Thresa JAYSON DEVONNA REFERRING PROVIDER: Crecencio [name], FNP    Progress Note Reporting Period 11/11/23 to 12/23/23  See note below for Objective Data and Assessment of Progress/Goals.      END OF SESSION:  PT End of Session - 12/23/23 1013     Visit Number 28    Number of Visits 29    Date for PT Re-Evaluation 12/26/23    Authorization Type [employer]    Progress Note Due on Visit 30    PT Start Time 1015    PT Stop Time 1100    PT Time Calculation (min) 45 min    Equipment Utilized During Treatment Gait belt    Activity Tolerance Patient tolerated treatment well    Behavior During Therapy WFL for tasks assessed/performed              Past Medical History:  Diagnosis Date   Allergy    Contact lens/glasses fitting    Hypertension    Past Surgical History:  Procedure Laterality Date   ABDOMINAL HYSTERECTOMY     BREAST SURGERY  2010   rt lumpectomy-neg   IR IMAGING GUIDED PORT INSERTION  12/18/2022   LAMINECTOMY N/A 09/21/2022   Procedure: THORACIC LAMINECTOMY FOR TUMOR;  Surgeon: Joshua Alm RAMAN, MD;  Location: [facility] OR;  Service: Neurosurgery;  Laterality: N/A;   OPEN REDUCTION INTERNAL FIXATION (ORIF) DISTAL RADIAL FRACTURE Left 11/26/2013   Procedure: OPEN REDUCTION INTERNAL FIXATION (ORIF) LEFT  DISTAL RADIUS ;  Surgeon: Dempsey JINNY Sensor, MD;  Location: [facility];  Service: Orthopedics;  Laterality: Left;  with block   TUBAL LIGATION     Patient Active Problem List   Diagnosis Date Noted   Acute deep vein thrombosis (DVT) of femoral vein of both lower extremities (HCC) 03/19/2023   Normocytic anemia 03/01/2023   Moderate protein malnutrition (HCC) 03/01/2023   Acute respiratory failure with hypoxia (HCC) 03/01/2023   Occlusion of left  pulmonary artery (HCC) 03/01/2023   Cardiomegaly 03/01/2023   Pleural effusion due to CHF (congestive heart failure) (HCC) 03/01/2023   Port-A-Cath in place 12/30/2022   Encounter for antineoplastic immunotherapy 11/18/2022   Encounter for antineoplastic chemotherapy 11/18/2022   Constipation 11/05/2022   Goals of care, counseling/discussion 10/21/2022   Cancer, metastatic to bone (HCC) 10/04/2022   Adenocarcinoma of right lung, stage 4 (HCC) 10/01/2022   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 09/20/2022   Hypokalemia 09/20/2022   Lung mass 09/20/2022   Hypothyroidism 09/20/2022   Cholelithiasis 09/20/2022   Lumbar spondylosis 08/26/2022   Thoracic back pain 04/04/2022   Low back pain 04/04/2022   Hypertension 09/23/2011    ONSET DATE: 08/08/2023 (MD order)  REFERRING DIAG:  Z91.81 (ICD-10-CM) - History of falling  C34.91 (ICD-10-CM) - Malignant neoplasm of unspecified part of right bronchus or lung    THERAPY DIAG:  Other abnormalities of gait and mobility  Unsteadiness on feet  Muscle weakness (generalized)  Other symptoms and signs involving the musculoskeletal system  Difficulty in walking, not elsewhere classified  Rationale for Evaluation and Treatment: Rehabilitation  SUBJECTIVE:  SUBJECTIVE STATEMENT: Feeling better, getting over the last chemo session. Taking anti-nausea Rx and that seems to help  Pt accompanied by: self  PERTINENT HISTORY: Stage IV (T1b, N2, M1 C) non-small cell lung cancer, adenocarcinoma presented with right lower lobe lung nodule in addition to small bilateral pulmonary nodules and right hilar and mediastinal  lymphadenopathy in addition to liver and bone metastasis diagnosed in April 2024. 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    PAIN:  Are you having pain? Yes: NPRS scale: mild/10 Pain location: low back Pain description: sore Aggravating factors: bending, sitting wrong  Relieving factors: tylenol    PRECAUTIONS: Fall and Other: currently in treatment for lung cancer  RED FLAGS: None   WEIGHT BEARING RESTRICTIONS: No  FALLS: Has patient fallen in last 6 months? No  LIVING ENVIRONMENT: Lives with: lives alone Lives in: House/apartment Stairs: 1 step to get into home Has following equipment at home: Single point cane, Environmental consultant - 4 wheeled, Wheelchair (manual), and shower chair  PLOF: Independent with household mobility with device and Independent with community mobility with device  PATIENT GOALS: Still need to work on balance, endurance, and would like to work on cane more.  OBJECTIVE:   TODAY'S TREATMENT: 12/23/23 Activity Comments  119/85 mmHg, 113 bpm, 94%   TUG w/ cane 14 sec w/ cane  DGI 16/24 w/ cane  10 meter walk test 19.41 sec = 1.7 ft/sec  M-CTSIB Condition 3: mild x 30 sec Condition 4: mod x 9 sec  Curb negotiation w/ cane and Supervision   Vitals 91% O2, 108 bpm   Squat lift practice 4 step and 5# dumbell standing on end to practice squat lift mechanics      TODAY'S TREATMENT: 12/16/2023 Activity Comments  Balance ex: Romberg + EO/EC + head turns/nods Airex + feet apart + EO/EC + head turns/nods Airex + Romberg + EO/EC 30 sec Increased sway EC  Vitals:  HR 119, O2 sats 89-93%   Deep breathing/pursed lip breathing exercises   Monster walk, 3 reps   Resisted sidestepping 2 reps at counter Green band  HR 121 bpm, O2 sats 91%   Stair negotiation 3 reps Cane and intermittent handrail, step to pattern  Gait between activities with cane, mod I        PATIENT EDUCATION: Education details: Try to walk at home 2-3 minutes several times/day; monitor O2 sats and contact MD if any symptoms that worsen in regards to coughing/breathing Person educated: Patient Education method: Explanation, Demonstration, Tactile cues, Verbal cues, and Handouts Education comprehension: verbalized understanding     HOME EXERCISE PROGRAM: Access Code: [email or web address] URL: [email or web address] Date: 11/18/2023 Prepared by: [facility] - Outpatient  Rehab - [facility]  Program Notes practice walking inside the house with your cane for 5 minutes per day.   Exercises - Seated Hamstring Curls with Resistance  - 1 x daily - 5 x weekly - 3 sets - 10 reps - Seated Knee Extension with Resistance  - 1 x daily - 5 x weekly - 3 sets - 10 reps - Seated Hip Abduction with Resistance  - 1 x daily - 5 x weekly - 2 sets - 10 reps - Side Stepping with Counter Support  - 1 x daily - 5 x weekly - 2-3 sets - 10 reps - Alternating Step Taps with Counter Support  - 1 x daily - 5 x weekly - 3  sets - 10 reps - Backward Walking with Counter Support  - 1 x daily - 5 x weekly - 1 sets - 5 reps - Squat with Chair Touch  - 1 x daily - 5 x weekly - 2 sets - 10 reps - Corner Balance Feet Together With Eyes Open  - 1 x daily - 5 x weekly - 2 sets - 30 sec hold - Corner Balance Feet Apart: Eyes Open With Head Turns  - 1 x daily - 5 x weekly - 2 sets - 30 sec hold - Romberg Stance with Head Nods  - 1 x daily - 5 x weekly - 2 sets - 30 sec hold - Stride Stance Weight Shift  - 1-2 x daily - 7 x weekly - 1-2 sets - 10 reps - Standing Gastroc Stretch on Step  - 1 x daily - 7 x weekly - 1 sets - 3 reps - 15-30 sec hold - Seated Figure 4 Piriformis Stretch  - 1 x daily - 7 x weekly - 1 sets - 3 reps - 15 sec hold       Note: Objective measures were completed at Evaluation unless otherwise noted.  DIAGNOSTIC FINDINGS: NA for this episode  COGNITION: Overall cognitive  status: Within functional limits for tasks assessed   SENSATION: Light touch: WFL Sometimes reports pressure/heaviness in feet  MUSCLE TONE: WFL BLEs   POSTURE: rounded shoulders and forward head  LOWER EXTREMITY ROM:   ACTIVE ROM WFL  LOWER EXTREMITY MMT:    MMT Right Eval Left Eval  Hip flexion 3+ 3+  Hip extension    Hip abduction 4 4  Hip adduction 4 4  Hip internal rotation    Hip external rotation    Knee flexion 3+ 4  Knee extension 4 4  Ankle dorsiflexion 3+ 3+  Ankle plantarflexion    Ankle inversion    Ankle eversion    (Blank rows = not tested)    TRANSFERS: Assistive device utilized: Environmental consultant - 4 wheeled  Sit to stand: Modified independence Stand to sit: Modified independence   GAIT: Gait pattern: step through pattern, decreased step length- Right, decreased stride length, and wide BOS Distance walked: 50 ft x 2 Assistive device utilized: Single point cane and Walker - 4 wheeled Level of assistance: SBA and CGA Comments: Pt brought in her cane (round tip base with SPC)  FUNCTIONAL TESTS:  5 times sit to stand: 13.63 sec arms crossed at chest Timed up and go (TUG): 17.37 with rollator; 20.91 sec with cane  10 meter walk test: 18.97 sec with rollator (1.73 ft/sec); 27.19 sec with cane (1.21 f/tsec) Lars Balance Scale: 32/56                                                                                                                                         GOALS: Goals reviewed with patient? Yes  SHORT TERM GOALS: Target date: 01/13/2024    Pt will be independent with HEP for improved strength, balance, gait. Baseline: reports some days better than others Goal status: MET 09/22/23  2.  Pt will improve 5x sit<>stand to less than or equal to 12 sec to demonstrate improved functional strength and transfer efficiency. Baseline:  13.63 sec; 14.49 sec without UE support 09/22/23 Goal status: NOT MET 09/22/23  3.  Pt will improve Berg  score to at least 38/56 to decrease fall risk. Baseline: 32/56; 49/56 09/22/23 Goal status: MET 09/22/23  4.  Pt will improve gait velocity to at least 2 ft/sec with rollator for improved gait efficiency and safety. Baseline: 1.72 ft/sec; 2.1 ft/sec 09/22/23 Goal status: MET 09/22/23  5.  Demo ability to pick up items from ground without UE support to improve safety in home  Baseline: UE support  Goal status: INITIAL  LONG TERM GOALS: Target date: 10/10/2023>UPDATED TARGET 11/21/2023>UPDATED TARGET 02/03/2024    Pt will be independent with progression of HEP for improved strength, balance, gait. Baseline: continues to be updated Goal status: IN PROGRESS  11/20/2023  2.  Pt will improve TUG score to less than or equal to 13.5 sec for decreased fall risk. Baseline: 15.9 sec cane 11/20/2023; 14 sec w/ cane Goal status: IN PROGRESS 12/23/23  3.  Pt will improve Berg score to at least 45/56 to decrease fall risk. Baseline: 32/56; 49/56 09/22/23 Goal status: MET 09/22/23  4.  Pt will improve gait velocity to at least 1.8 ft/sec with cane for improved gait efficiency and safety.   Baseline: 1.58 ft/sec with cane 09/22/23, 1.75 ft/sec with cane 10/10/2023; 2.05 ft/sec 11/11/2023 Goal status: MET 11/11/2023  5.  Pt will verbalize continued community fitness upon d/c from PT. Baseline: have provided education, not yet started Goal status: IN PROGRESS 12/23/23  6. Pt will improve DGI score to at least 18/24 to decrease fall risk.  Baseline:  11/24>15/24 11/20/2023; 16/24  Goal status:  IN PROGRESS 12/23/2023  7.  Pt will negotiate single curb step with cane and modified independent for improved community mobility.  Baseline:  CGA 11/20/2023; Supervision w/ cane  Goal status:  REVISED 12/23/23  8.  Pt will improve gait velocity to at least 2.3 ft/sec for improved gait efficiency and safety.  Baseline:  2.07 ft/sec; 1.7 ft/sec w/ cane  Goal status:  IN PROGRESS, 12/23/23    9.  Pt will perform Condition 4 on  MCTSIB 30 sec with mod sway or better for improved balance.    Baseline:  19 sec; moderate x 9 sec     Goal status:  IN PROGRESS 12/23/23  ASSESSMENT:  CLINICAL IMPRESSION: POC review with improved perofrmance TUG test to 14 sec w/ cane progressing to low fall risk category. Dynamic Gait Index score 16/24 improved from 15/24 with use of cane. Greatest difficulty with head movements whilst walking and stair negotiation. Stair/curb negotiation w/ supervision with difficulty in eccentric lowering but improved from previous min A. Gait speed and multisensory balance remain largely similar to past reports. Pt would benefit from continued sessions as remains high risk for falls and need for further intervention to improve safety with community mobility as this is supervision level.   OBJECTIVE IMPAIRMENTS: Abnormal gait, decreased balance, decreased mobility, difficulty walking, and decreased strength.   ACTIVITY LIMITATIONS: bending, standing, squatting, transfers, bed mobility, and locomotion level  PARTICIPATION LIMITATIONS: driving, shopping, and community activity  PERSONAL FACTORS: 3+ comorbidities: see above are also affecting patient's  functional outcome.   REHAB POTENTIAL: Good  CLINICAL DECISION MAKING: Evolving/moderate complexity  EVALUATION COMPLEXITY: Moderate  PLAN:  PT FREQUENCY: 1x/week  PT DURATION: 6 weeks per recert 12/23/23  PLANNED INTERVENTIONS: 97750- Physical Performance Testing, 97110-Therapeutic exercises, 97530- Therapeutic activity, 97112- Neuromuscular re-education, 97535- Self Care, [id number]- Manual therapy, [phone]- Gait training, Patient/Family education, and Balance training  PLAN FOR NEXT SESSION: gait w/out AD, squat lift items from floor   12:43 PM, 12/23/23 M. Kelly Tanuj Mullens, PT, DPT Physical Therapist- [place] Office Number: [phone]

## 2023-12-24 ENCOUNTER — Other Ambulatory Visit: Payer: Self-pay

## 2023-12-29 NOTE — Progress Notes (Unsigned)
 [facility] OFFICE PROGRESS NOTE  Stamey, [name], FNP [address] [place] [address]  DIAGNOSIS: Stage IV (T1b, N2, M1 C) non-small cell lung cancer, adenocarcinoma presented with right lower lobe lung nodule in addition to small bilateral pulmonary nodules and right hilar and mediastinal lymphadenopathy in addition to liver and bone metastasis diagnosed in April 2024. 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   PDL1: 20%   Guardant 360: no actionable mutations.  PRIOR THERAPY:  1) Decompressive thoracic laminectomy of the T7 with spinal cord decompression and biopsy of the epidural mass in April 2024.  2) Palliative radiation to the bone under the care of Dr. Izell. Last day scheduled for 10/25/22   CURRENT THERAPY: Carboplatin  for an AUC of 5, Alimta  500 mg/m, Keytruda  200 mg IV every 3 weeks.  First dose expected on 10/28/2022. Status post 18 cycles.  Starting from cycle #5 the patient is on maintenance treatment with Alimta  and Keytruda  every 3 weeks.  Starting from cycle #7 the patient will be treated with single agent Alimta  500 Mg/M2 every 3 weeks.  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   INTERVAL HISTORY: Hayley Wu 73 y.o. female returns to the clinic today for a follow-up visit accompanied by her daughter. She was last seen by Dr. Sherrod 3 weeks ago. She is currently on single agent Alimta . Keytruda  was discontinued due to suspicious immunotherapy mediated pneumonitis after a hospitalization in September 2024.  In the interval since last being seen, she denies any major changes in her health. She denies fever, chills, or night sweats. She reports loss of appetite due to new food aversions. She states that she has protein shakes at home, but does not always remember to drink one everyday.  Her breathing is  fine. She no longer requires supplemental oxygen  at night.  She is more active with walking. She participates in PT twice a week. She denies any significant cough, but reports sinus drainage. She does not take Zyrtec or Claritin.  Denies any hemoptysis.  She reports increased nausea following treatment, though well controlled with nausea medication. She requires a refill. Denies any vomiting, diarrhea, or constipation.  Denies any headaches, vision changes, or falls. Denies abdominal pain. She denies any new symptoms. Her PCP is monitoring her thyroid  studies and she is requesting that these be drawn next appointment and sent to her PCP. She is on 150 mcg of synthroid . She is here for evaluation and repeat blood work before undergoing cycle #18.    MEDICAL HISTORY: Past Medical History:  Diagnosis Date   Allergy    Contact lens/glasses fitting    Hypertension     ALLERGIES:  is allergic to erythromycin, amoxicillin, latex, and prednisone .  MEDICATIONS:  Current Outpatient Medications  Medication Sig Dispense Refill   acetaminophen  (TYLENOL ) 500 MG tablet Take 500 mg by mouth every 6 (six) hours as needed for moderate pain.     amLODipine  (NORVASC ) 10 MG tablet Take 10 mg by mouth daily.     apixaban  (ELIQUIS ) 5 MG TABS tablet Take 1 tablet (5 mg total) by mouth 2 (two) times daily. 60 tablet 2   folic acid  (FOLVITE ) 1 MG tablet Take 1 tablet (1 mg total) by mouth daily. 90 tablet 1   Ipratropium-Albuterol  (COMBIVENT ) 20-100 MCG/ACT AERS respimat Inhale 1 puff into the lungs every 6 (six)  hours as needed for wheezing or shortness of breath. 4 g 1   levothyroxine  (SYNTHROID ) 112 MCG tablet Take 112 mcg by mouth daily before breakfast.     lidocaine -prilocaine  (EMLA ) cream Apply 1 Application topically as needed. 30 g 2   mirtazapine  (REMERON ) 30 MG tablet TAKE 1 TABLET BY MOUTH AT BEDTIME 90 tablet 0   potassium chloride  SA (KLOR-CON  M) 20 MEQ tablet Take 1 tablet (20 mEq total) by mouth  daily. (Patient not taking: Reported on 10/29/2023) 6 tablet 0   prochlorperazine  (COMPAZINE ) 10 MG tablet Take 1 tablet (10 mg total) by mouth every 6 (six) hours as needed. (Patient taking differently: Take 10 mg by mouth every 6 (six) hours as needed for refractory nausea / vomiting.) 30 tablet 2   VITAMIN D PO Take 1 tablet by mouth daily.     No current facility-administered medications for this visit.    SURGICAL HISTORY:  Past Surgical History:  Procedure Laterality Date   ABDOMINAL HYSTERECTOMY     BREAST SURGERY  2010   rt lumpectomy-neg   IR IMAGING GUIDED PORT INSERTION  12/18/2022   LAMINECTOMY N/A 09/21/2022   Procedure: THORACIC LAMINECTOMY FOR TUMOR;  Surgeon: Joshua Alm RAMAN, MD;  Location: [facility] OR;  Service: Neurosurgery;  Laterality: N/A;   OPEN REDUCTION INTERNAL FIXATION (ORIF) DISTAL RADIAL FRACTURE Left 11/26/2013   Procedure: OPEN REDUCTION INTERNAL FIXATION (ORIF) LEFT  DISTAL RADIUS ;  Surgeon: Dempsey JINNY Sensor, MD;  Location: [facility];  Service: Orthopedics;  Laterality: Left;  with block   TUBAL LIGATION      REVIEW OF SYSTEMS:   Review of Systems  Constitutional: Negative for appetite change, chills, fatigue, fever and unexpected weight change.  HENT: Positive for occasional nasal congestion. Negative for mouth sores, nosebleeds, sore throat and trouble swallowing.   Eyes: Negative for eye problems and icterus.  Respiratory: Positive for stable dyspnea on exertion. Negative for cough, hemoptysis, and wheezing.     Cardiovascular: Negative for chest pain and swelling.  Gastrointestinal: Mild nausea after treatment. Negative for abdominal pain, constipation, diarrhea, and vomiting.  Genitourinary: Negative for bladder incontinence, difficulty urinating, dysuria, frequency and hematuria.   Musculoskeletal: Negative for back pain, gait problem, neck pain and neck stiffness.  Skin: Negative for itching and rash.  Neurological: Negative for dizziness,  extremity weakness, gait problem, headaches, light-headedness and seizures.  Hematological: Negative for adenopathy. Does not bruise/bleed easily.  Psychiatric/Behavioral: Negative for confusion, depression and sleep disturbance. The patient is not nervous/anxious.     PHYSICAL EXAMINATION:  There were no vitals taken for this visit.  ECOG PERFORMANCE STATUS: 1  Physical Exam  Constitutional: Oriented to person, place, and time and well-developed, well-nourished, and in no distress. HENT:  Head: Normocephalic and atraumatic.  Mouth/Throat: Oropharynx is clear and moist. No oropharyngeal exudate.  Eyes: Conjunctivae are normal. Right eye exhibits no discharge. Left eye exhibits no discharge. No scleral icterus.  Neck: Normal range of motion. Neck supple.  Cardiovascular: Normal rate, regular rhythm, normal heart sounds and intact distal pulses.   Pulmonary/Chest: Effort normal and breath sounds normal. No respiratory distress. No wheezes. No rales.  Abdominal: Soft. Bowel sounds are normal. Exhibits no distension and no mass. There is no tenderness.  Musculoskeletal: Normal range of motion.  Lymphadenopathy:    No cervical adenopathy.  Neurological: Alert and oriented to person, place, and time. Exhibits muscle wasting. Ambulates with a walker.  Skin: Skin is warm and dry. No rash noted. Not diaphoretic.  No erythema. No pallor.  Psychiatric: Mood, memory and judgment normal.  Vitals reviewed.  LABORATORY DATA: Lab Results  Component Value Date   WBC 6.1 12/11/2023   HGB 13.4 12/11/2023   HCT 40.3 12/11/2023   MCV 87.2 12/11/2023   PLT 257 12/11/2023      Chemistry      Component Value Date/Time   NA 141 12/11/2023 1325   K 3.4 (L) 12/11/2023 1325   CL 103 12/11/2023 1325   CO2 29 12/11/2023 1325   BUN 10 12/11/2023 1325   CREATININE 0.77 12/11/2023 1325      Component Value Date/Time   CALCIUM 9.2 12/11/2023 1325   ALKPHOS 82 12/11/2023 1325   AST 16 12/11/2023 1325    ALT 9 12/11/2023 1325   BILITOT 0.5 12/11/2023 1325       RADIOGRAPHIC STUDIES:  No results found.   ASSESSMENT/PLAN:  This is a very pleasant 73 year old Caucasian female recently diagnosed with stage IV (T1b, N2, M1 C) non-small cell lung cancer, adenocarcinoma presented with right lower lobe lung nodule in addition to small bilateral pulmonary nodules and right hilar and mediastinal lymphadenopathy in addition to liver and bone metastasis diagnosed in April 2024. 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 Her PDL1 expression is 20%. She has no actionable mutations.    She is completed palliative radiation to the painful metastatic bone lesions under the care of Dr. Izell in the last day radiation was on 10/25/2022.    She is currently undergoing systemic chemotherapy with carboplatin  for an AUC of 5, Alimta  500 mg/m, and Keytruda  200 mg IV every 3 weeks. She is status post 18 cycles.  Starting from cycle #5 she was on maintenance treatment with Alimta  and Keytruda .  Starting after cycle #6 Keytruda  was discontinued from the care plan due to suspicious immunotherapy mediated pneumonitis.  Therefore, she is currently undergoing single agent chemotherapy with Alimta  IV every 3 weeks.   Labs were reviewed. Recommend she proceed with cycle #19 today as scheduled.    We will see her back for follow-up visit in 3 weeks for evaluation and repeat blood work before undergoing cycle #19.   I will order a restaging CT scan prior to her next cycle of treatment.   I sent a refill of compazine .   I let her know we are happy to send her TSH and T4 to her PCP once resulted. I have ordered thyroid  studies for her next appointment.   Fatigue Discussed dose adjustment if grogginess persists. - Consider taking Remeron  earlier in the evening. - Discuss potential dose reduction of Remeron  if fatigue  continues.  Weight loss Reported weight loss of a couple of pounds. Discussed strategies to maintain weight, including protein drinks and small frequent meals. - Encourage use of protein drinks and small frequent meals. - Set reminders to eat if appetite is low.  The patient was advised to call immediately if she has any concerning symptoms in the interval. The patient voices understanding of current disease status and treatment options and is in agreement with the current care plan. All questions were answered. The patient knows to call the clinic with any problems, questions or concerns. We can certainly see the patient much sooner if necessary      No orders of the defined types were placed in this encounter.    The total time spent in the  appointment was 20-29 minutes  Tredarius Cobern L Kendi Defalco, PA-C 12/29/23

## 2023-12-31 ENCOUNTER — Ambulatory Visit: Admitting: Physical Therapy

## 2023-12-31 ENCOUNTER — Encounter: Payer: Self-pay | Admitting: Physical Therapy

## 2023-12-31 DIAGNOSIS — R29898 Other symptoms and signs involving the musculoskeletal system: Secondary | ICD-10-CM

## 2023-12-31 DIAGNOSIS — R262 Difficulty in walking, not elsewhere classified: Secondary | ICD-10-CM

## 2023-12-31 DIAGNOSIS — R2689 Other abnormalities of gait and mobility: Secondary | ICD-10-CM

## 2023-12-31 DIAGNOSIS — R2681 Unsteadiness on feet: Secondary | ICD-10-CM

## 2023-12-31 DIAGNOSIS — M6281 Muscle weakness (generalized): Secondary | ICD-10-CM

## 2023-12-31 NOTE — Therapy (Signed)
 OUTPATIENT PHYSICAL THERAPY NEURO TREATMENT   Patient Name: Hayley Wu MRN: [id number] DOB:1951-03-01, 73 y.o., female Today's Date: 12/31/2023  PCP: Lanis Thresa JAYSON DEVONNA REFERRING PROVIDER: Crecencio [name], FNP     END OF SESSION:  PT End of Session - 12/31/23 1104     Visit Number 29    Number of Visits 34    Date for PT Re-Evaluation 02/03/24    Authorization Type [employer]    Progress Note Due on Visit 38    PT Start Time 1104    PT Stop Time 1142    PT Time Calculation (min) 38 min    Equipment Utilized During Treatment Gait belt    Activity Tolerance Patient tolerated treatment well    Behavior During Therapy WFL for tasks assessed/performed               Past Medical History:  Diagnosis Date   Allergy    Contact lens/glasses fitting    Hypertension    Past Surgical History:  Procedure Laterality Date   ABDOMINAL HYSTERECTOMY     BREAST SURGERY  2010   rt lumpectomy-neg   IR IMAGING GUIDED PORT INSERTION  12/18/2022   LAMINECTOMY N/A 09/21/2022   Procedure: THORACIC LAMINECTOMY FOR TUMOR;  Surgeon: Joshua Alm RAMAN, MD;  Location: [facility] OR;  Service: Neurosurgery;  Laterality: N/A;   OPEN REDUCTION INTERNAL FIXATION (ORIF) DISTAL RADIAL FRACTURE Left 11/26/2013   Procedure: OPEN REDUCTION INTERNAL FIXATION (ORIF) LEFT  DISTAL RADIUS ;  Surgeon: Dempsey JINNY Sensor, MD;  Location: [facility];  Service: Orthopedics;  Laterality: Left;  with block   TUBAL LIGATION     Patient Active Problem List   Diagnosis Date Noted   Acute deep vein thrombosis (DVT) of femoral vein of both lower extremities (HCC) 03/19/2023   Normocytic anemia 03/01/2023   Moderate protein malnutrition (HCC) 03/01/2023   Acute respiratory failure with hypoxia (HCC) 03/01/2023   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   Cardiomegaly 03/01/2023   Pleural effusion due to CHF (congestive heart failure) (HCC) 03/01/2023   Port-A-Cath in place 12/30/2022    Encounter for antineoplastic immunotherapy 11/18/2022   Encounter for antineoplastic chemotherapy 11/18/2022   Constipation 11/05/2022   Goals of care, counseling/discussion 10/21/2022   Cancer, metastatic to bone (HCC) 10/04/2022   Adenocarcinoma of right lung, stage 4 (HCC) 10/01/2022   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 09/20/2022   Hypokalemia 09/20/2022   Lung mass 09/20/2022   Hypothyroidism 09/20/2022   Cholelithiasis 09/20/2022   Lumbar spondylosis 08/26/2022   Thoracic back pain 04/04/2022   Low back pain 04/04/2022   Hypertension 09/23/2011    ONSET DATE: 08/08/2023 (MD order)  REFERRING DIAG:  Z91.81 (ICD-10-CM) - History of falling  C34.91 (ICD-10-CM) - Malignant neoplasm of unspecified part of right bronchus or lung    THERAPY DIAG:  Other abnormalities of gait and mobility  Unsteadiness on feet  Muscle weakness (generalized)  Other symptoms and signs involving the musculoskeletal system  Difficulty in walking, not elsewhere classified  Rationale for Evaluation and Treatment: Rehabilitation  SUBJECTIVE:  SUBJECTIVE STATEMENT: Pt states she is feeling fatigued today. No pain. Will be getting chemo tomorrow.  Pt accompanied by: self  PERTINENT HISTORY: Stage IV (T1b, N2, M1 C) non-small cell lung cancer, adenocarcinoma presented with right lower lobe lung nodule in addition to small bilateral pulmonary nodules and right hilar and mediastinal lymphadenopathy in addition to liver and bone metastasis diagnosed in April 2024. She is status post decompressive thoracic laminectomy of the T7 with spinal cord decompression and biopsy of the  epidural mass in April 2024.    PAIN:  Are you having pain? Yes: NPRS scale: mild/10 Pain location: low back Pain description: sore Aggravating factors: bending, sitting wrong  Relieving factors: tylenol    PRECAUTIONS: Fall and Other: currently in treatment for lung cancer  RED FLAGS: None   WEIGHT BEARING RESTRICTIONS: No  FALLS: Has patient fallen in last 6 months? No  LIVING ENVIRONMENT: Lives with: lives alone Lives in: House/apartment Stairs: 1 step to get into home Has following equipment at home: Single point cane, Environmental consultant - 4 wheeled, Wheelchair (manual), and shower chair  PLOF: Independent with household mobility with device and Independent with community mobility with device  PATIENT GOALS: Still need to work on balance, endurance, and would like to work on cane more.  OBJECTIVE:  TODAY'S TREATMENT: 12/31/23 Activity Comments  Nustep L5 UEs/LEs 2x 4 min Maintaining >60 SPM. Dropped to 88% spO2, 119 BPM --> waited until she recovered to 92%  Alternating forward foot taps on 6 step 3x30 No UE support 88%-92% spO2, 120-130 BPM  Step up and over 4 step 2x30 Bilat UE support in // bars 88%-90% spO2, 120-132 BPM  Side stepping with arms out stretched & diaphragmatic breaths in // bars 3 laps. Cues to breathe in with arms up and looking up at ceiling, breathe out with arms down 86%-92%, 120-140 BPM                 PATIENT EDUCATION: Education details: Try to walk at home 2-3 minutes several times/day; monitor O2 sats and contact MD if any symptoms that worsen in regards to coughing/breathing Person educated: Patient Education method: Explanation, Demonstration, Tactile cues, Verbal cues, and Handouts Education comprehension: verbalized understanding     HOME EXERCISE PROGRAM: Access Code: [email or web address] URL: [email or web address] Date: 11/18/2023 Prepared by: [facility] - Outpatient  Rehab - [facility]  Program Notes practice walking  inside the house with your cane for 5 minutes per day.   Exercises - Seated Hamstring Curls with Resistance  - 1 x daily - 5 x weekly - 3 sets - 10 reps - Seated Knee Extension with Resistance  - 1 x daily - 5 x weekly - 3 sets - 10 reps - Seated Hip Abduction with Resistance  - 1 x daily - 5 x weekly - 2 sets - 10 reps - Side Stepping with Counter Support  - 1 x daily - 5 x weekly - 2-3 sets - 10 reps - Alternating Step Taps with Counter Support  - 1 x daily - 5 x weekly - 3 sets - 10 reps - Backward Walking with Counter Support  - 1 x daily - 5 x weekly - 1 sets - 5 reps - Squat with Chair Touch  - 1 x daily - 5 x weekly - 2 sets - 10 reps - Corner Balance Feet Together With Eyes Open  - 1 x daily - 5 x weekly - 2 sets - 30 sec hold - Corner Balance Feet Apart:  Eyes Open With Head Turns  - 1 x daily - 5 x weekly - 2 sets - 30 sec hold - Romberg Stance with Head Nods  - 1 x daily - 5 x weekly - 2 sets - 30 sec hold - Stride Stance Weight Shift  - 1-2 x daily - 7 x weekly - 1-2 sets - 10 reps - Standing Gastroc Stretch on Step  - 1 x daily - 7 x weekly - 1 sets - 3 reps - 15-30 sec hold - Seated Figure 4 Piriformis Stretch  - 1 x daily - 7 x weekly - 1 sets - 3 reps - 15 sec hold       Note: Objective measures were completed at Evaluation unless otherwise noted.  DIAGNOSTIC FINDINGS: NA for this episode  COGNITION: Overall cognitive status: Within functional limits for tasks assessed   SENSATION: Light touch: WFL Sometimes reports pressure/heaviness in feet  MUSCLE TONE: WFL BLEs   POSTURE: rounded shoulders and forward head  LOWER EXTREMITY ROM:   ACTIVE ROM WFL  LOWER EXTREMITY MMT:    MMT Right Eval Left Eval  Hip flexion 3+ 3+  Hip extension    Hip abduction 4 4  Hip adduction 4 4  Hip internal rotation    Hip external rotation    Knee flexion 3+ 4  Knee extension 4 4  Ankle dorsiflexion 3+ 3+  Ankle plantarflexion    Ankle inversion    Ankle eversion     (Blank rows = not tested)    TRANSFERS: Assistive device utilized: Environmental consultant - 4 wheeled  Sit to stand: Modified independence Stand to sit: Modified independence   GAIT: Gait pattern: step through pattern, decreased step length- Right, decreased stride length, and wide BOS Distance walked: 50 ft x 2 Assistive device utilized: Single point cane and Walker - 4 wheeled Level of assistance: SBA and CGA Comments: Pt brought in her cane (round tip base with SPC)  FUNCTIONAL TESTS:  5 times sit to stand: 13.63 sec arms crossed at chest Timed up and go (TUG): 17.37 with rollator; 20.91 sec with cane  10 meter walk test: 18.97 sec with rollator (1.73 ft/sec); 27.19 sec with cane (1.21 f/tsec) Lars Balance Scale: 32/56                                                                                                                                         GOALS: Goals reviewed with patient? Yes  SHORT TERM GOALS: Target date: 01/13/2024    Pt will be independent with HEP for improved strength, balance, gait. Baseline: reports some days better than others Goal status: MET 09/22/23  2.  Pt will improve 5x sit<>stand to less than or equal to 12 sec to demonstrate improved functional strength and transfer efficiency. Baseline:  13.63 sec; 14.49 sec without UE support 09/22/23 Goal status: NOT MET 09/22/23  3.  Pt  will improve Berg score to at least 38/56 to decrease fall risk. Baseline: 32/56; 49/56 09/22/23 Goal status: MET 09/22/23  4.  Pt will improve gait velocity to at least 2 ft/sec with rollator for improved gait efficiency and safety. Baseline: 1.72 ft/sec; 2.1 ft/sec 09/22/23 Goal status: MET 09/22/23  5.  Demo ability to pick up items from ground without UE support to improve safety in home  Baseline: UE support  Goal status: INITIAL  LONG TERM GOALS: Target date: 10/10/2023>UPDATED TARGET 11/21/2023>UPDATED TARGET 02/03/2024    Pt will be independent with progression of  HEP for improved strength, balance, gait. Baseline: continues to be updated Goal status: IN PROGRESS  11/20/2023  2.  Pt will improve TUG score to less than or equal to 13.5 sec for decreased fall risk. Baseline: 15.9 sec cane 11/20/2023; 14 sec w/ cane Goal status: IN PROGRESS 12/23/23  3.  Pt will improve Berg score to at least 45/56 to decrease fall risk. Baseline: 32/56; 49/56 09/22/23 Goal status: MET 09/22/23  4.  Pt will improve gait velocity to at least 1.8 ft/sec with cane for improved gait efficiency and safety.   Baseline: 1.58 ft/sec with cane 09/22/23, 1.75 ft/sec with cane 10/10/2023; 2.05 ft/sec 11/11/2023 Goal status: MET 11/11/2023  5.  Pt will verbalize continued community fitness upon d/c from PT. Baseline: have provided education, not yet started Goal status: IN PROGRESS 12/23/23  6. Pt will improve DGI score to at least 18/24 to decrease fall risk.  Baseline:  11/24>15/24 11/20/2023; 16/24  Goal status:  IN PROGRESS 12/23/2023  7.  Pt will negotiate single curb step with cane and modified independent for improved community mobility.  Baseline:  CGA 11/20/2023; Supervision w/ cane  Goal status:  REVISED 12/23/23  8.  Pt will improve gait velocity to at least 2.3 ft/sec for improved gait efficiency and safety.  Baseline:  2.07 ft/sec; 1.7 ft/sec w/ cane  Goal status:  IN PROGRESS, 12/23/23    9.  Pt will perform Condition 4 on MCTSIB 30 sec with mod sway or better for improved balance.    Baseline:  19 sec; moderate x 9 sec     Goal status:  IN PROGRESS 12/23/23  ASSESSMENT:  CLINICAL IMPRESSION: Treatment session concentrated on combining aerobics, balance and breathing. Most challenged with side stepping with UE reaches and coordinating breaths today.   OBJECTIVE IMPAIRMENTS: Abnormal gait, decreased balance, decreased mobility, difficulty walking, and decreased strength.   ACTIVITY LIMITATIONS: bending, standing, squatting, transfers, bed mobility, and locomotion  level  PARTICIPATION LIMITATIONS: driving, shopping, and community activity  PERSONAL FACTORS: 3+ comorbidities: see above are also affecting patient's functional outcome.   REHAB POTENTIAL: Good  CLINICAL DECISION MAKING: Evolving/moderate complexity  EVALUATION COMPLEXITY: Moderate  PLAN:  PT FREQUENCY: 1x/week  PT DURATION: 6 weeks per recert 12/23/23  PLANNED INTERVENTIONS: 97750- Physical Performance Testing, 97110-Therapeutic exercises, 97530- Therapeutic activity, 97112- Neuromuscular re-education, 97535- Self Care, [id number]- Manual therapy, [phone]- Gait training, Patient/Family education, and Balance training  PLAN FOR NEXT SESSION: gait w/out AD, squat lift items from floor, aerobics/breathing   11:04 AM, 12/31/23 Marnesha Gagen April Ma L Braulio Kiedrowski, PT, DPT Physical Therapist- [place] Office Number: [phone]

## 2024-01-01 ENCOUNTER — Inpatient Hospital Stay

## 2024-01-01 ENCOUNTER — Encounter: Payer: Self-pay | Admitting: Internal Medicine

## 2024-01-01 ENCOUNTER — Inpatient Hospital Stay (HOSPITAL_BASED_OUTPATIENT_CLINIC_OR_DEPARTMENT_OTHER): Admitting: Physician Assistant

## 2024-01-01 VITALS — BP 137/83 | HR 115 | Temp 98.2°F | Resp 20 | Wt 160.7 lb

## 2024-01-01 VITALS — HR 100

## 2024-01-01 DIAGNOSIS — C3491 Malignant neoplasm of unspecified part of right bronchus or lung: Secondary | ICD-10-CM

## 2024-01-01 DIAGNOSIS — E039 Hypothyroidism, unspecified: Secondary | ICD-10-CM

## 2024-01-01 DIAGNOSIS — Z95828 Presence of other vascular implants and grafts: Secondary | ICD-10-CM

## 2024-01-01 DIAGNOSIS — Z5111 Encounter for antineoplastic chemotherapy: Secondary | ICD-10-CM

## 2024-01-01 DIAGNOSIS — C7951 Secondary malignant neoplasm of bone: Secondary | ICD-10-CM

## 2024-01-01 LAB — CBC WITH DIFFERENTIAL (CANCER CENTER ONLY)
Abs Immature Granulocytes: 0.01 K/uL (ref 0.00–0.07)
Basophils Absolute: 0 K/uL (ref 0.0–0.1)
Basophils Relative: 1 %
Eosinophils Absolute: 0.1 K/uL (ref 0.0–0.5)
Eosinophils Relative: 2 %
HCT: 41.1 % (ref 36.0–46.0)
Hemoglobin: 13.3 g/dL (ref 12.0–15.0)
Immature Granulocytes: 0 %
Lymphocytes Relative: 17 %
Lymphs Abs: 0.6 K/uL — ABNORMAL LOW (ref 0.7–4.0)
MCH: 28.5 pg (ref 26.0–34.0)
MCHC: 32.4 g/dL (ref 30.0–36.0)
MCV: 88.2 fL (ref 80.0–100.0)
Monocytes Absolute: 0.4 K/uL (ref 0.1–1.0)
Monocytes Relative: 9 %
Neutro Abs: 2.7 K/uL (ref 1.7–7.7)
Neutrophils Relative %: 71 %
Platelet Count: 242 K/uL (ref 150–400)
RBC: 4.66 MIL/uL (ref 3.87–5.11)
RDW: 14.2 % (ref 11.5–15.5)
WBC Count: 3.8 K/uL — ABNORMAL LOW (ref 4.0–10.5)
nRBC: 0 % (ref 0.0–0.2)

## 2024-01-01 LAB — CMP (CANCER CENTER ONLY)
ALT: 12 U/L (ref 0–44)
AST: 19 U/L (ref 15–41)
Albumin: 3.9 g/dL (ref 3.5–5.0)
Alkaline Phosphatase: 86 U/L (ref 38–126)
Anion gap: 6 (ref 5–15)
BUN: 12 mg/dL (ref 8–23)
CO2: 29 mmol/L (ref 22–32)
Calcium: 9.3 mg/dL (ref 8.9–10.3)
Chloride: 106 mmol/L (ref 98–111)
Creatinine: 0.72 mg/dL (ref 0.44–1.00)
GFR, Estimated: >60 mL/min (ref >60)
Glucose, Bld: 146 mg/dL — ABNORMAL HIGH (ref 70–99)
Potassium: 3.6 mmol/L (ref 3.5–5.1)
Sodium: 141 mmol/L (ref 135–145)
Total Bilirubin: 0.5 mg/dL (ref 0.0–1.2)
Total Protein: 6.8 g/dL (ref 6.5–8.1)

## 2024-01-01 MED ORDER — PROCHLORPERAZINE MALEATE 10 MG PO TABS: 10.0000 mg | ORAL_TABLET | Freq: Four times a day (QID) | ORAL | 2 refills | Status: AC | PRN

## 2024-01-01 MED ORDER — SODIUM CHLORIDE 0.9% FLUSH
10.0000 mL | INTRAVENOUS | Status: DC | PRN
  Administered 2024-01-01: 10 mL

## 2024-01-01 MED ORDER — HEPARIN SOD (PORK) LOCK FLUSH 100 UNIT/ML IV SOLN
500.0000 [IU] | Freq: Once | INTRAVENOUS | Status: AC | PRN
  Administered 2024-01-01: 500 [IU]

## 2024-01-01 MED ORDER — SODIUM CHLORIDE 0.9 % IV SOLN
500.0000 mg/m2 | Freq: Once | INTRAVENOUS | Status: AC
  Administered 2024-01-01: 900 mg via INTRAVENOUS
  Filled 2024-01-01: qty 20

## 2024-01-01 MED ORDER — PROCHLORPERAZINE MALEATE 10 MG PO TABS
10.0000 mg | ORAL_TABLET | Freq: Once | ORAL | Status: AC
  Administered 2024-01-01: 10 mg via ORAL
  Filled 2024-01-01: qty 1

## 2024-01-01 MED ORDER — SODIUM CHLORIDE 0.9 % IV SOLN: Freq: Once | INTRAVENOUS | Status: AC

## 2024-01-01 MED ORDER — DEXAMETHASONE SODIUM PHOSPHATE 10 MG/ML IJ SOLN
10.0000 mg | Freq: Once | INTRAMUSCULAR | Status: AC
  Administered 2024-01-01: 10 mg via INTRAVENOUS
  Filled 2024-01-01: qty 1

## 2024-01-01 MED ORDER — SODIUM CHLORIDE 0.9% FLUSH
10.0000 mL | Freq: Once | INTRAVENOUS | Status: AC
  Administered 2024-01-01: 10 mL

## 2024-01-03 ENCOUNTER — Other Ambulatory Visit: Payer: Self-pay

## 2024-01-06 ENCOUNTER — Ambulatory Visit

## 2024-01-06 DIAGNOSIS — R262 Difficulty in walking, not elsewhere classified: Secondary | ICD-10-CM

## 2024-01-06 DIAGNOSIS — M6281 Muscle weakness (generalized): Secondary | ICD-10-CM

## 2024-01-06 DIAGNOSIS — R2681 Unsteadiness on feet: Secondary | ICD-10-CM

## 2024-01-06 DIAGNOSIS — R29898 Other symptoms and signs involving the musculoskeletal system: Secondary | ICD-10-CM

## 2024-01-06 DIAGNOSIS — R2689 Other abnormalities of gait and mobility: Secondary | ICD-10-CM

## 2024-01-06 NOTE — Therapy (Signed)
 OUTPATIENT PHYSICAL THERAPY NEURO TREATMENT   Patient Name: Hayley Wu MRN: [id number] DOB:[birth year], 73 y.o., female Today's Date: 01/06/2024  PCP: Lanis Thresa JAYSON DEVONNA REFERRING PROVIDER: Crecencio [name], FNP     END OF SESSION:  PT End of Session - 01/06/24 1019     Visit Number 30    Number of Visits 34    Date for PT Re-Evaluation 02/03/24    Authorization Type [employer]    Progress Note Due on Visit 38    PT Start Time 1020    PT Stop Time 1100    PT Time Calculation (min) 40 min    Equipment Utilized During Treatment Gait belt    Activity Tolerance Patient tolerated treatment well    Behavior During Therapy WFL for tasks assessed/performed               Past Medical History:  Diagnosis Date   Allergy    Contact lens/glasses fitting    Hypertension    Past Surgical History:  Procedure Laterality Date   ABDOMINAL HYSTERECTOMY     BREAST SURGERY  2010   rt lumpectomy-neg   IR IMAGING GUIDED PORT INSERTION  12/18/2022   LAMINECTOMY N/A 09/21/2022   Procedure: THORACIC LAMINECTOMY FOR TUMOR;  Surgeon: Joshua Alm RAMAN, MD;  Location: [facility] OR;  Service: Neurosurgery;  Laterality: N/A;   OPEN REDUCTION INTERNAL FIXATION (ORIF) DISTAL RADIAL FRACTURE Left 11/26/2013   Procedure: OPEN REDUCTION INTERNAL FIXATION (ORIF) LEFT  DISTAL RADIUS ;  Surgeon: Dempsey JINNY Sensor, MD;  Location: [facility];  Service: Orthopedics;  Laterality: Left;  with block   TUBAL LIGATION     Patient Active Problem List   Diagnosis Date Noted   Acute deep vein thrombosis (DVT) of femoral vein of both lower extremities (HCC) 03/19/2023   Normocytic anemia 03/01/2023   Moderate protein malnutrition (HCC) 03/01/2023   Acute respiratory failure with hypoxia (HCC) 03/01/2023   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   Cardiomegaly 03/01/2023   Pleural effusion due to CHF (congestive heart failure) (HCC) 03/01/2023   Port-A-Cath in place 12/30/2022    Encounter for antineoplastic immunotherapy 11/18/2022   Encounter for antineoplastic chemotherapy 11/18/2022   Constipation 11/05/2022   Goals of care, counseling/discussion 10/21/2022   Cancer, metastatic to bone (HCC) 10/04/2022   Adenocarcinoma of right lung, stage 4 (HCC) 10/01/2022   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 09/20/2022   Hypokalemia 09/20/2022   Lung mass 09/20/2022   Hypothyroidism 09/20/2022   Cholelithiasis 09/20/2022   Lumbar spondylosis 08/26/2022   Thoracic back pain 04/04/2022   Low back pain 04/04/2022   Hypertension 09/23/2011    ONSET DATE: 08/08/2023 (MD order)  REFERRING DIAG:  Z91.81 (ICD-10-CM) - History of falling  C34.91 (ICD-10-CM) - Malignant neoplasm of unspecified part of right bronchus or lung    THERAPY DIAG:  Other abnormalities of gait and mobility  Unsteadiness on feet  Muscle weakness (generalized)  Other symptoms and signs involving the musculoskeletal system  Difficulty in walking, not elsewhere classified  Rationale for Evaluation and Treatment: Rehabilitation  SUBJECTIVE:  SUBJECTIVE STATEMENT: Had chemo last week, so not feeling the best Pt accompanied by: self  PERTINENT HISTORY: Stage IV (T1b, N2, M1 C) non-small cell lung cancer, adenocarcinoma presented with right lower lobe lung nodule in addition to small bilateral pulmonary nodules and right hilar and mediastinal lymphadenopathy in addition to liver and bone metastasis diagnosed in April 2024. 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    PAIN:   Are you having pain? Yes: NPRS scale: mild/10 Pain location: low back Pain description: sore Aggravating factors: bending, sitting wrong  Relieving factors: tylenol    PRECAUTIONS: Fall and Other: currently in treatment for lung cancer  RED FLAGS: None   WEIGHT BEARING RESTRICTIONS: No  FALLS: Has patient fallen in last 6 months? No  LIVING ENVIRONMENT: Lives with: lives alone Lives in: House/apartment Stairs: 1 step to get into home Has following equipment at home: Single point cane, Environmental consultant - 4 wheeled, Wheelchair (manual), and shower chair  PLOF: Independent with household mobility with device and Independent with community mobility with device  PATIENT GOALS: Still need to work on balance, endurance, and would like to work on cane more.  OBJECTIVE:   TODAY'S TREATMENT: 01/06/24 Activity Comments  Forwards/backwards Sidestepping Alt stair taps, 4 Stepping over half roll bolster Ascending 4-6 stairs no HR (descending w/ HR) Fig. 8 x 60 sec (dizzy) Resisted walking green X 2 min no UE support, CGA for safety  Seated LE PRE 3x15,  -LAQ 3# -hamstring curls green -clamshells w/ green                TODAY'S TREATMENT: 12/31/23 Activity Comments  Nustep L5 UEs/LEs 2x 4 min Maintaining >60 SPM. Dropped to 88% spO2, 119 BPM --> waited until she recovered to 92%  Alternating forward foot taps on 6 step 3x30 No UE support 88%-92% spO2, 120-130 BPM  Step up and over 4 step 2x30 Bilat UE support in // bars 88%-90% spO2, 120-132 BPM  Side stepping with arms out stretched & diaphragmatic breaths in // bars 3 laps. Cues to breathe in with arms up and looking up at ceiling, breathe out with arms down 86%-92%, 120-140 BPM                 PATIENT EDUCATION: Education details: Try to walk at home 2-3 minutes several times/day; monitor O2 sats and contact MD if any symptoms that worsen in regards to coughing/breathing Person educated: Patient Education method:  Explanation, Demonstration, Tactile cues, Verbal cues, and Handouts Education comprehension: verbalized understanding     HOME EXERCISE PROGRAM: Access Code: [email or web address] URL: [email or web address] Date: 11/18/2023 Prepared by: [facility]  Program Notes practice walking inside the house with your cane for 5 minutes per day.   Exercises - Seated Hamstring Curls with Resistance  - 1 x daily - 5 x weekly - 3 sets - 10 reps - Seated Knee Extension with Resistance  - 1 x daily - 5 x weekly - 3 sets - 10 reps - Seated Hip Abduction with Resistance  - 1 x daily - 5 x weekly - 2 sets - 10 reps - Side Stepping with Counter Support  - 1 x daily - 5 x weekly - 2-3 sets - 10 reps - Alternating Step Taps with Counter Support  - 1 x daily - 5 x weekly - 3 sets - 10 reps - Backward Walking  with Counter Support  - 1 x daily - 5 x weekly - 1 sets - 5 reps - Squat with Chair Touch  - 1 x daily - 5 x weekly - 2 sets - 10 reps - Corner Balance Feet Together With Eyes Open  - 1 x daily - 5 x weekly - 2 sets - 30 sec hold - Corner Balance Feet Apart: Eyes Open With Head Turns  - 1 x daily - 5 x weekly - 2 sets - 30 sec hold - Romberg Stance with Head Nods  - 1 x daily - 5 x weekly - 2 sets - 30 sec hold - Stride Stance Weight Shift  - 1-2 x daily - 7 x weekly - 1-2 sets - 10 reps - Standing Gastroc Stretch on Step  - 1 x daily - 7 x weekly - 1 sets - 3 reps - 15-30 sec hold - Seated Figure 4 Piriformis Stretch  - 1 x daily - 7 x weekly - 1 sets - 3 reps - 15 sec hold       Note: Objective measures were completed at Evaluation unless otherwise noted.  DIAGNOSTIC FINDINGS: NA for this episode  COGNITION: Overall cognitive status: Within functional limits for tasks assessed   SENSATION: Light touch: WFL Sometimes reports pressure/heaviness in feet  MUSCLE TONE: WFL BLEs   POSTURE: rounded shoulders and forward head  LOWER EXTREMITY ROM:    ACTIVE ROM WFL  LOWER EXTREMITY MMT:    MMT Right Eval Left Eval  Hip flexion 3+ 3+  Hip extension    Hip abduction 4 4  Hip adduction 4 4  Hip internal rotation    Hip external rotation    Knee flexion 3+ 4  Knee extension 4 4  Ankle dorsiflexion 3+ 3+  Ankle plantarflexion    Ankle inversion    Ankle eversion    (Blank rows = not tested)    TRANSFERS: Assistive device utilized: Environmental consultant - 4 wheeled  Sit to stand: Modified independence Stand to sit: Modified independence   GAIT: Gait pattern: step through pattern, decreased step length- Right, decreased stride length, and wide BOS Distance walked: 50 ft x 2 Assistive device utilized: Single point cane and Walker - 4 wheeled Level of assistance: SBA and CGA Comments: Pt brought in her cane (round tip base with SPC)  FUNCTIONAL TESTS:  5 times sit to stand: 13.63 sec arms crossed at chest Timed up and go (TUG): 17.37 with rollator; 20.91 sec with cane  10 meter walk test: 18.97 sec with rollator (1.73 ft/sec); 27.19 sec with cane (1.21 f/tsec) Lars Balance Scale: 32/56                                                                                                                                         GOALS: Goals reviewed with patient? Yes  SHORT TERM GOALS: Target date: 01/13/2024  Pt will be independent with HEP for improved strength, balance, gait. Baseline: reports some days better than others Goal status: MET 09/22/23  2.  Pt will improve 5x sit<>stand to less than or equal to 12 sec to demonstrate improved functional strength and transfer efficiency. Baseline:  13.63 sec; 14.49 sec without UE support 09/22/23 Goal status: NOT MET 09/22/23  3.  Pt will improve Berg score to at least 38/56 to decrease fall risk. Baseline: 32/56; 49/56 09/22/23 Goal status: MET 09/22/23  4.  Pt will improve gait velocity to at least 2 ft/sec with rollator for improved gait efficiency and safety. Baseline: 1.72  ft/sec; 2.1 ft/sec 09/22/23 Goal status: MET 09/22/23  5.  Demo ability to pick up items from ground without UE support to improve safety in home  Baseline: UE support  Goal status: INITIAL  LONG TERM GOALS: Target date: 10/10/2023>UPDATED TARGET 11/21/2023>UPDATED TARGET 02/03/2024    Pt will be independent with progression of HEP for improved strength, balance, gait. Baseline: continues to be updated Goal status: IN PROGRESS  11/20/2023  2.  Pt will improve TUG score to less than or equal to 13.5 sec for decreased fall risk. Baseline: 15.9 sec cane 11/20/2023; 14 sec w/ cane Goal status: IN PROGRESS 12/23/23  3.  Pt will improve Berg score to at least 45/56 to decrease fall risk. Baseline: 32/56; 49/56 09/22/23 Goal status: MET 09/22/23  4.  Pt will improve gait velocity to at least 1.8 ft/sec with cane for improved gait efficiency and safety.   Baseline: 1.58 ft/sec with cane 09/22/23, 1.75 ft/sec with cane 10/10/2023; 2.05 ft/sec 11/11/2023 Goal status: MET 11/11/2023  5.  Pt will verbalize continued community fitness upon d/c from PT. Baseline: have provided education, not yet started Goal status: IN PROGRESS 12/23/23  6. Pt will improve DGI score to at least 18/24 to decrease fall risk.  Baseline:  11/24>15/24 11/20/2023; 16/24  Goal status:  IN PROGRESS 12/23/2023  7.  Pt will negotiate single curb step with cane and modified independent for improved community mobility.  Baseline:  CGA 11/20/2023; Supervision w/ cane  Goal status:  REVISED 12/23/23  8.  Pt will improve gait velocity to at least 2.3 ft/sec for improved gait efficiency and safety.  Baseline:  2.07 ft/sec; 1.7 ft/sec w/ cane  Goal status:  IN PROGRESS, 12/23/23    9.  Pt will perform Condition 4 on MCTSIB 30 sec with mod sway or better for improved balance.    Baseline:  19 sec; moderate x 9 sec     Goal status:  IN PROGRESS 12/23/23  ASSESSMENT:  CLINICAL IMPRESSION: Gait training to improve safety with ambulation  without use of AD with emphasis on obstacle negotiation and stair ambulation requiring CGA-SBA for safety but no instances of LOB other than with descending stairs and walking in tight spaces such as figure 8.  LE PRE to improve muscular endurance to improve activity tolerance  OBJECTIVE IMPAIRMENTS: Abnormal gait, decreased balance, decreased mobility, difficulty walking, and decreased strength.   ACTIVITY LIMITATIONS: bending, standing, squatting, transfers, bed mobility, and locomotion level  PARTICIPATION LIMITATIONS: driving, shopping, and community activity  PERSONAL FACTORS: 3+ comorbidities: see above are also affecting patient's functional outcome.   REHAB POTENTIAL: Good  CLINICAL DECISION MAKING: Evolving/moderate complexity  EVALUATION COMPLEXITY: Moderate  PLAN:  PT FREQUENCY: 1x/week  PT DURATION: 6 weeks per recert 12/23/23  PLANNED INTERVENTIONS: 97750- Physical Performance Testing, 97110-Therapeutic exercises, 97530- Therapeutic activity, [id number]- Neuromuscular re-education, 97535- Self Care, [id number]- Manual  therapy, [phone]- Gait training, Patient/Family education, and Balance training  PLAN FOR NEXT SESSION: gait w/out AD, squat lift items from floor, aerobics/breathing   10:19 AM, 01/06/24 Jonette MARLA Sandifer, PT, DPT Physical Therapist- [place] Office Number: [phone]

## 2024-01-07 ENCOUNTER — Other Ambulatory Visit: Payer: Self-pay

## 2024-01-12 NOTE — Therapy (Signed)
 OUTPATIENT PHYSICAL THERAPY NEURO TREATMENT   Patient Name: Hayley Wu MRN: [id number] DOB:October 08, 1950, 73 y.o., female Today's Date: 01/13/2024  PCP: Lanis Thresa JAYSON DEVONNA REFERRING PROVIDER: Crecencio [name], FNP     END OF SESSION:  PT End of Session - 01/13/24 1102     Visit Number 31    Number of Visits 34    Date for PT Re-Evaluation 02/03/24    Authorization Type [employer]    Progress Note Due on Visit 38    PT Start Time 1016    PT Stop Time 1101    PT Time Calculation (min) 45 min    Equipment Utilized During Treatment Gait belt    Activity Tolerance Patient tolerated treatment well    Behavior During Therapy WFL for tasks assessed/performed                Past Medical History:  Diagnosis Date   Allergy    Contact lens/glasses fitting    Hypertension    Past Surgical History:  Procedure Laterality Date   ABDOMINAL HYSTERECTOMY     BREAST SURGERY  2010   rt lumpectomy-neg   IR IMAGING GUIDED PORT INSERTION  12/18/2022   LAMINECTOMY N/A 09/21/2022   Procedure: THORACIC LAMINECTOMY FOR TUMOR;  Surgeon: Joshua Alm RAMAN, MD;  Location: [facility] OR;  Service: Neurosurgery;  Laterality: N/A;   OPEN REDUCTION INTERNAL FIXATION (ORIF) DISTAL RADIAL FRACTURE Left 11/26/2013   Procedure: OPEN REDUCTION INTERNAL FIXATION (ORIF) LEFT  DISTAL RADIUS ;  Surgeon: Dempsey JINNY Sensor, MD;  Location: [facility];  Service: Orthopedics;  Laterality: Left;  with block   TUBAL LIGATION     Patient Active Problem List   Diagnosis Date Noted   Acute deep vein thrombosis (DVT) of femoral vein of both lower extremities (HCC) 03/19/2023   Normocytic anemia 03/01/2023   Moderate protein malnutrition (HCC) 03/01/2023   Acute respiratory failure with hypoxia (HCC) 03/01/2023   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   Cardiomegaly 03/01/2023   Pleural effusion due to CHF (congestive heart failure) (HCC) 03/01/2023   Port-A-Cath in place  12/30/2022   Encounter for antineoplastic immunotherapy 11/18/2022   Encounter for antineoplastic chemotherapy 11/18/2022   Constipation 11/05/2022   Goals of care, counseling/discussion 10/21/2022   Cancer, metastatic to bone (HCC) 10/04/2022   Adenocarcinoma of right lung, stage 4 (HCC) 10/01/2022   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 09/20/2022   Hypokalemia 09/20/2022   Lung mass 09/20/2022   Hypothyroidism 09/20/2022   Cholelithiasis 09/20/2022   Lumbar spondylosis 08/26/2022   Thoracic back pain 04/04/2022   Low back pain 04/04/2022   Hypertension 09/23/2011    ONSET DATE: 08/08/2023 (MD order)  REFERRING DIAG:  Z91.81 (ICD-10-CM) - History of falling  C34.91 (ICD-10-CM) - Malignant neoplasm of unspecified part of right bronchus or lung    THERAPY DIAG:  Other abnormalities of gait and mobility  Unsteadiness on feet  Muscle weakness (generalized)  Other symptoms and signs involving the musculoskeletal system  Difficulty in walking, not elsewhere classified  Rationale for Evaluation and Treatment: Rehabilitation  SUBJECTIVE:  SUBJECTIVE STATEMENT: Tired. Reports that she took her meds later than usual last night.   Pt accompanied by: self  PERTINENT HISTORY: Stage IV (T1b, N2, M1 C) non-small cell lung cancer, adenocarcinoma presented with right lower lobe lung nodule in addition to small bilateral pulmonary nodules and right hilar and mediastinal lymphadenopathy in addition to liver and bone metastasis diagnosed in April 2024. She is status post decompressive thoracic laminectomy of the T7 with spinal cord decompression and biopsy of the  epidural mass in April 2024.    PAIN:  Are you having pain? Yes: NPRS scale: no/10 Pain location: low back Pain description: sore Aggravating factors: bending, sitting wrong  Relieving factors: tylenol    PRECAUTIONS: Fall and Other: currently in treatment for lung cancer  RED FLAGS: None   WEIGHT BEARING RESTRICTIONS: No  FALLS: Has patient fallen in last 6 months? No  LIVING ENVIRONMENT: Lives with: lives alone Lives in: House/apartment Stairs: 1 step to get into home Has following equipment at home: Single point cane, Environmental consultant - 4 wheeled, Wheelchair (manual), and shower chair  PLOF: Independent with household mobility with device and Independent with community mobility with device  PATIENT GOALS: Still need to work on balance, endurance, and would like to work on cane more.  OBJECTIVE:      TODAY'S TREATMENT: 01/13/24 Activity Comments  Vitals at start of session 130/81 mmHg, 119 bpm 93% spO2 Pt denies chest pain, SOB, dizziness. She reports tachycardia at baseline and was told it is due to all the medications I'm taking  gait training on TM for endurance and dynamic balance while monitoring vitals, 1.1-1.3 mph B UE support; cueing for longer rather than quicker steps  After 3 min: 86%, 119-130 bpm  Allowed standing, then sitting rest break and cueing for purse lip breathing until O2 reached >90% and HR went down to baseline   gait, picking up beanbags from the floor  CGA; occasionally reaching to lean on items in gym for stability  Cornhole bending to each side to pick up beanbags from basket Good stability. Cueing to bend through knees rather than spine. Sitting rest break d/t fatigue  95% spO2, 114bpm   squat to lift small step stool from the floor, simulating lifting a box from the floor Required CGA and cueing for proper mechanics, maintaining chest upright rather than leaning down   gait + head turns/nods with and without SPC  Good stability with cane,  required CGA without cane d/t mild instability   86% spO2, 124bpm Allowed sitting rest break until O2 reached >90% and HR went down to baseline     PATIENT EDUCATION: Education details: discussed normal vital signs and to discuss pt's O2 and HR with oncologist at next scheduled appt on 01/21/24 Person educated: Patient Education method: Explanation Education comprehension: verbalized understanding     HOME EXERCISE PROGRAM: Access Code: [email or web address] URL: [email or web address] Date: 11/18/2023 Prepared by: [facility] - [facility]  Program Notes practice walking inside the house with your cane for 5 minutes per day.   Exercises - Seated Hamstring Curls with Resistance  - 1 x daily - 5 x weekly - 3 sets - 10 reps - Seated Knee Extension with Resistance  - 1 x daily - 5 x weekly - 3 sets - 10 reps - Seated Hip Abduction with Resistance  - 1 x daily - 5 x weekly - 2 sets - 10 reps - Side Stepping with Counter Support  - 1 x daily -  5 x weekly - 2-3 sets - 10 reps - Alternating Step Taps with Counter Support  - 1 x daily - 5 x weekly - 3 sets - 10 reps - Backward Walking with Counter Support  - 1 x daily - 5 x weekly - 1 sets - 5 reps - Squat with Chair Touch  - 1 x daily - 5 x weekly - 2 sets - 10 reps - Corner Balance Feet Together With Eyes Open  - 1 x daily - 5 x weekly - 2 sets - 30 sec hold - Corner Balance Feet Apart: Eyes Open With Head Turns  - 1 x daily - 5 x weekly - 2 sets - 30 sec hold - Romberg Stance with Head Nods  - 1 x daily - 5 x weekly - 2 sets - 30 sec hold - Stride Stance Weight Shift  - 1-2 x daily - 7 x weekly - 1-2 sets - 10 reps - Standing Gastroc Stretch on Step  - 1 x daily - 7 x weekly - 1 sets - 3 reps - 15-30 sec hold - Seated Figure 4 Piriformis Stretch  - 1 x daily - 7 x weekly - 1 sets - 3 reps - 15 sec hold       Note: Objective measures were completed at Evaluation unless otherwise noted.  DIAGNOSTIC  FINDINGS: NA for this episode  COGNITION: Overall cognitive status: Within functional limits for tasks assessed   SENSATION: Light touch: WFL Sometimes reports pressure/heaviness in feet  MUSCLE TONE: WFL BLEs   POSTURE: rounded shoulders and forward head  LOWER EXTREMITY ROM:   ACTIVE ROM WFL  LOWER EXTREMITY MMT:    MMT Right Eval Left Eval  Hip flexion 3+ 3+  Hip extension    Hip abduction 4 4  Hip adduction 4 4  Hip internal rotation    Hip external rotation    Knee flexion 3+ 4  Knee extension 4 4  Ankle dorsiflexion 3+ 3+  Ankle plantarflexion    Ankle inversion    Ankle eversion    (Blank rows = not tested)    TRANSFERS: Assistive device utilized: Environmental consultant - 4 wheeled  Sit to stand: Modified independence Stand to sit: Modified independence   GAIT: Gait pattern: step through pattern, decreased step length- Right, decreased stride length, and wide BOS Distance walked: 50 ft x 2 Assistive device utilized: Single point cane and Walker - 4 wheeled Level of assistance: SBA and CGA Comments: Pt brought in her cane (round tip base with SPC)  FUNCTIONAL TESTS:  5 times sit to stand: 13.63 sec arms crossed at chest Timed up and go (TUG): 17.37 with rollator; 20.91 sec with cane  10 meter walk test: 18.97 sec with rollator (1.73 ft/sec); 27.19 sec with cane (1.21 f/tsec) Berg Balance Scale: 32/56  GOALS: Goals reviewed with patient? Yes  SHORT TERM GOALS: Target date: 01/13/2024    Pt will be independent with HEP for improved strength, balance, gait. Baseline: reports some days better than others Goal status: MET 09/22/23  2.  Pt will improve 5x sit<>stand to less than or equal to 12 sec to demonstrate improved functional strength and transfer efficiency. Baseline:  13.63 sec; 14.49 sec without UE support  09/22/23 Goal status: NOT MET 09/22/23  3.  Pt will improve Berg score to at least 38/56 to decrease fall risk. Baseline: 32/56; 49/56 09/22/23 Goal status: MET 09/22/23  4.  Pt will improve gait velocity to at least 2 ft/sec with rollator for improved gait efficiency and safety. Baseline: 1.72 ft/sec; 2.1 ft/sec 09/22/23 Goal status: MET 09/22/23  5.  Demo ability to pick up items from ground without UE support to improve safety in home  Baseline: UE support  Goal status: INITIAL  LONG TERM GOALS: Target date: 10/10/2023>UPDATED TARGET 11/21/2023>UPDATED TARGET 02/03/2024    Pt will be independent with progression of HEP for improved strength, balance, gait. Baseline: continues to be updated Goal status: IN PROGRESS  11/20/2023  2.  Pt will improve TUG score to less than or equal to 13.5 sec for decreased fall risk. Baseline: 15.9 sec cane 11/20/2023; 14 sec w/ cane Goal status: IN PROGRESS 12/23/23  3.  Pt will improve Berg score to at least 45/56 to decrease fall risk. Baseline: 32/56; 49/56 09/22/23 Goal status: MET 09/22/23  4.  Pt will improve gait velocity to at least 1.8 ft/sec with cane for improved gait efficiency and safety.   Baseline: 1.58 ft/sec with cane 09/22/23, 1.75 ft/sec with cane 10/10/2023; 2.05 ft/sec 11/11/2023 Goal status: MET 11/11/2023  5.  Pt will verbalize continued community fitness upon d/c from PT. Baseline: have provided education, not yet started Goal status: IN PROGRESS 12/23/23  6. Pt will improve DGI score to at least 18/24 to decrease fall risk.  Baseline:  11/24>15/24 11/20/2023; 16/24  Goal status:  IN PROGRESS 12/23/2023  7.  Pt will negotiate single curb step with cane and modified independent for improved community mobility.  Baseline:  CGA 11/20/2023; Supervision w/ cane  Goal status:  REVISED 12/23/23  8.  Pt will improve gait velocity to at least 2.3 ft/sec for improved gait efficiency and safety.  Baseline:  2.07 ft/sec; 1.7 ft/sec w/ cane  Goal  status:  IN PROGRESS, 12/23/23    9.  Pt will perform Condition 4 on MCTSIB 30 sec with mod sway or better for improved balance.    Baseline:  19 sec; moderate x 9 sec     Goal status:  IN PROGRESS 12/23/23  ASSESSMENT:  CLINICAL IMPRESSION: Patient arrived to session with report of fatigue. Vitals at start of session revealed elevated HR, similar to previous sessions. Patient asymptomatic and endorses a hx of tachycardia, thus proceeded with session while monitoring vitals. Patient demonstrated reduction is spO2 after short walk on TM, requiring rest break to recover vitals. Proceeded with simulation of functional activities focusing on standing balance with bending. Cueing provided to ensure neutral spine. Patient's balance with cane continues to improve; remaining imbalance evident without AD. No complaints at end of session and vitals returned to baseline upon leaving.   OBJECTIVE IMPAIRMENTS: Abnormal gait, decreased balance, decreased mobility, difficulty walking, and decreased strength.   ACTIVITY LIMITATIONS: bending, standing, squatting, transfers, bed mobility, and locomotion level  PARTICIPATION LIMITATIONS: driving, shopping, and community activity  PERSONAL FACTORS: 3+  comorbidities: see above are also affecting patient's functional outcome.   REHAB POTENTIAL: Good  CLINICAL DECISION MAKING: Evolving/moderate complexity  EVALUATION COMPLEXITY: Moderate  PLAN:  PT FREQUENCY: 1x/week  PT DURATION: 6 weeks per recert 12/23/23  PLANNED INTERVENTIONS: 97750- Physical Performance Testing, 97110-Therapeutic exercises, 97530- Therapeutic activity, 97112- Neuromuscular re-education, 97535- Self Care, [id number]- Manual therapy, [id number]- Gait training, Patient/Family education, and Balance training  PLAN FOR NEXT SESSION: gait w/out AD, squat lift items from floor, aerobics/breathing   Louana Terrilyn Christians, PT, DPT 01/13/24 11:04 AM  [facility] [address], [address] [place], [place] [address] Phone # [phone] Fax # [phone]

## 2024-01-13 ENCOUNTER — Ambulatory Visit: Attending: Family Medicine | Admitting: Physical Therapy

## 2024-01-13 ENCOUNTER — Encounter: Payer: Self-pay | Admitting: Physical Therapy

## 2024-01-13 DIAGNOSIS — R2681 Unsteadiness on feet: Secondary | ICD-10-CM | POA: Insufficient documentation

## 2024-01-13 DIAGNOSIS — R262 Difficulty in walking, not elsewhere classified: Secondary | ICD-10-CM | POA: Insufficient documentation

## 2024-01-13 DIAGNOSIS — M6281 Muscle weakness (generalized): Secondary | ICD-10-CM | POA: Diagnosis not present

## 2024-01-13 DIAGNOSIS — R2689 Other abnormalities of gait and mobility: Secondary | ICD-10-CM | POA: Diagnosis not present

## 2024-01-13 DIAGNOSIS — R29898 Other symptoms and signs involving the musculoskeletal system: Secondary | ICD-10-CM | POA: Insufficient documentation

## 2024-01-16 ENCOUNTER — Ambulatory Visit (HOSPITAL_COMMUNITY)
Admission: RE | Admit: 2024-01-16 | Discharge: 2024-01-16 | Disposition: A | Discharge status: Home / Self Care | Source: Ambulatory Visit | Attending: Physician Assistant | Admitting: Physician Assistant

## 2024-01-16 DIAGNOSIS — C7951 Secondary malignant neoplasm of bone: Secondary | ICD-10-CM | POA: Diagnosis not present

## 2024-01-16 DIAGNOSIS — C3491 Malignant neoplasm of unspecified part of right bronchus or lung: Secondary | ICD-10-CM | POA: Diagnosis not present

## 2024-01-16 DIAGNOSIS — C3490 Malignant neoplasm of unspecified part of unspecified bronchus or lung: Secondary | ICD-10-CM | POA: Diagnosis not present

## 2024-01-16 DIAGNOSIS — I700 Atherosclerosis of aorta: Secondary | ICD-10-CM | POA: Diagnosis not present

## 2024-01-16 MED ORDER — IOHEXOL 300 MG/ML  SOLN
100.0000 mL | Freq: Once | INTRAMUSCULAR | Status: AC | PRN
  Administered 2024-01-16: 100 mL via INTRAVENOUS

## 2024-01-20 ENCOUNTER — Ambulatory Visit

## 2024-01-20 DIAGNOSIS — R262 Difficulty in walking, not elsewhere classified: Secondary | ICD-10-CM

## 2024-01-20 DIAGNOSIS — R2689 Other abnormalities of gait and mobility: Secondary | ICD-10-CM

## 2024-01-20 DIAGNOSIS — M6281 Muscle weakness (generalized): Secondary | ICD-10-CM

## 2024-01-20 DIAGNOSIS — R29898 Other symptoms and signs involving the musculoskeletal system: Secondary | ICD-10-CM

## 2024-01-20 DIAGNOSIS — R2681 Unsteadiness on feet: Secondary | ICD-10-CM

## 2024-01-20 NOTE — Therapy (Signed)
 OUTPATIENT PHYSICAL THERAPY NEURO TREATMENT   Patient Name: Hayley Wu MRN: [id number] DOB:02-05-1951, 73 y.o., female Today's Date: 01/20/2024  PCP: Lanis Thresa JAYSON DEVONNA REFERRING PROVIDER: Crecencio [name], FNP     END OF SESSION:  PT End of Session - 01/20/24 1010     Visit Number 32    Number of Visits 34    Date for PT Re-Evaluation 02/03/24    Authorization Type [employer]    Progress Note Due on Visit 38    PT Start Time 1015    PT Stop Time 1100    PT Time Calculation (min) 45 min    Equipment Utilized During Treatment Gait belt    Activity Tolerance Patient tolerated treatment well    Behavior During Therapy WFL for tasks assessed/performed                Past Medical History:  Diagnosis Date   Allergy    Contact lens/glasses fitting    Hypertension    Past Surgical History:  Procedure Laterality Date   ABDOMINAL HYSTERECTOMY     BREAST SURGERY  2010   rt lumpectomy-neg   IR IMAGING GUIDED PORT INSERTION  12/18/2022   LAMINECTOMY N/A 09/21/2022   Procedure: THORACIC LAMINECTOMY FOR TUMOR;  Surgeon: Joshua Alm RAMAN, MD;  Location[facility];  Service: Neurosurgery;  Laterality: N/A;   OPEN REDUCTION INTERNAL FIXATION (ORIF) DISTAL RADIAL FRACTURE Left 11/26/2013   Procedure: OPEN REDUCTION INTERNAL FIXATION (ORIF) LEFT  DISTAL RADIUS ;  Surgeon: Dempsey JINNY Sensor, MD;  Location: [facility];  Service: Orthopedics;  Laterality: Left;  with block   TUBAL LIGATION     Patient Active Problem List   Diagnosis Date Noted   Acute deep vein thrombosis (DVT) of femoral vein of both lower extremities (HCC) 03/19/2023   Normocytic anemia 03/01/2023   Moderate protein malnutrition (HCC) 03/01/2023   Acute respiratory failure with hypoxia (HCC) 03/01/2023   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   Cardiomegaly 03/01/2023   Pleural effusion due to CHF (congestive heart failure) (HCC) 03/01/2023   Port-A-Cath in place  12/30/2022   Encounter for antineoplastic immunotherapy 11/18/2022   Encounter for antineoplastic chemotherapy 11/18/2022   Constipation 11/05/2022   Goals of care, counseling/discussion 10/21/2022   Cancer, metastatic to bone (HCC) 10/04/2022   Adenocarcinoma of right lung, stage 4 (HCC) 10/01/2022   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 09/20/2022   Hypokalemia 09/20/2022   Lung mass 09/20/2022   Hypothyroidism 09/20/2022   Cholelithiasis 09/20/2022   Lumbar spondylosis 08/26/2022   Thoracic back pain 04/04/2022   Low back pain 04/04/2022   Hypertension 09/23/2011    ONSET DATE: 08/08/2023 (MD order)  REFERRING DIAG:  Z91.81 (ICD-10-CM) - History of falling  C34.91 (ICD-10-CM) - Malignant neoplasm of unspecified part of right bronchus or lung    THERAPY DIAG:  Other abnormalities of gait and mobility  Unsteadiness on feet  Muscle weakness (generalized)  Other symptoms and signs involving the musculoskeletal system  Difficulty in walking, not elsewhere classified  Rationale for Evaluation and Treatment: Rehabilitation  SUBJECTIVE:  SUBJECTIVE STATEMENT: Doing ok, still fatigue and low energy  Pt accompanied by: self  PERTINENT HISTORY: Stage IV (T1b, N2, M1 C) non-small cell lung cancer, adenocarcinoma presented with right lower lobe lung nodule in addition to small bilateral pulmonary nodules and right hilar and mediastinal lymphadenopathy in addition to liver and bone metastasis diagnosed in April 2024. 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     PAIN:  Are you having pain? Yes: NPRS scale: no/10 Pain location: low back Pain description: sore Aggravating factors: bending, sitting wrong  Relieving factors: tylenol    PRECAUTIONS: Fall and Other: currently in treatment for lung cancer  RED FLAGS: None   WEIGHT BEARING RESTRICTIONS: No  FALLS: Has patient fallen in last 6 months? No  LIVING ENVIRONMENT: Lives with: lives alone Lives in: House/apartment Stairs: 1 step to get into home Has following equipment at home: Single point cane, Environmental consultant - 4 wheeled, Wheelchair (manual), and shower chair  PLOF: Independent with household mobility with device and Independent with community mobility with device  PATIENT GOALS: Still need to work on balance, endurance, and would like to work on cane more.  OBJECTIVE:    TODAY'S TREATMENT: 01/20/24 Activity Comments  LE PRE-endurance 3x12 -LAQ 3# -hip add iso -seated hip abd, green -seated hamstring curls, double green -standing alt stair taps, 3#, 6  Modified deadlift 1x12 8#, 4  Treadmill training 2 min intervals 1.6 mph w/ 60 sec rest between. 3 rounds (6 min walking). HR 119 bpm Cues for pursed lip breathing               TODAY'S TREATMENT: 01/13/24 Activity Comments  Vitals at start of session 130/81 mmHg, 119 bpm 93% spO2 Pt denies chest pain, SOB, dizziness. She reports tachycardia at baseline and was told it is due to all the medications I'm taking  gait training on TM for endurance and dynamic balance while monitoring vitals, 1.1-1.3 mph B UE support; cueing for longer rather than quicker steps  After 3 min: 86%, 119-130 bpm  Allowed standing, then sitting rest break and cueing for purse lip breathing until O2 reached >90% and HR went down to baseline   gait, picking up beanbags from the floor  CGA; occasionally reaching to lean on items in gym for stability  Cornhole bending to each side to pick up beanbags from basket Good stability. Cueing to bend  through knees rather than spine. Sitting rest break d/t fatigue  95% spO2, 114bpm   squat to lift small step stool from the floor, simulating lifting a box from the floor Required CGA and cueing for proper mechanics, maintaining chest upright rather than leaning down   gait + head turns/nods with and without SPC  Good stability with cane, required CGA without cane d/t mild instability   86% spO2, 124bpm Allowed sitting rest break until O2 reached >90% and HR went down to baseline     PATIENT EDUCATION: Education details: discussed normal vital signs and to discuss pt's O2 and HR with oncologist at next scheduled appt on 01/21/24 Person educated: Patient Education method: Explanation Education comprehension: verbalized understanding     HOME EXERCISE PROGRAM: Access Code: [email or web address] URL: [email or web address] Date: 11/18/2023 Prepared by: [facility] - Outpatient  Rehab - [facility]  Program Notes practice walking inside the house with your cane for 5 minutes per day.   Exercises - Seated Hamstring Curls with Resistance  -  1 x daily - 5 x weekly - 3 sets - 10 reps - Seated Knee Extension with Resistance  - 1 x daily - 5 x weekly - 3 sets - 10 reps - Seated Hip Abduction with Resistance  - 1 x daily - 5 x weekly - 2 sets - 10 reps - Side Stepping with Counter Support  - 1 x daily - 5 x weekly - 2-3 sets - 10 reps - Alternating Step Taps with Counter Support  - 1 x daily - 5 x weekly - 3 sets - 10 reps - Backward Walking with Counter Support  - 1 x daily - 5 x weekly - 1 sets - 5 reps - Squat with Chair Touch  - 1 x daily - 5 x weekly - 2 sets - 10 reps - Corner Balance Feet Together With Eyes Open  - 1 x daily - 5 x weekly - 2 sets - 30 sec hold - Corner Balance Feet Apart: Eyes Open With Head Turns  - 1 x daily - 5 x weekly - 2 sets - 30 sec hold - Romberg Stance with Head Nods  - 1 x daily - 5 x weekly - 2 sets - 30 sec hold - Stride Stance Weight Shift  - 1-2 x  daily - 7 x weekly - 1-2 sets - 10 reps - Standing Gastroc Stretch on Step  - 1 x daily - 7 x weekly - 1 sets - 3 reps - 15-30 sec hold - Seated Figure 4 Piriformis Stretch  - 1 x daily - 7 x weekly - 1 sets - 3 reps - 15 sec hold       Note: Objective measures were completed at Evaluation unless otherwise noted.  DIAGNOSTIC FINDINGS: NA for this episode  COGNITION: Overall cognitive status: Within functional limits for tasks assessed   SENSATION: Light touch: WFL Sometimes reports pressure/heaviness in feet  MUSCLE TONE: WFL BLEs   POSTURE: rounded shoulders and forward head  LOWER EXTREMITY ROM:   ACTIVE ROM WFL  LOWER EXTREMITY MMT:    MMT Right Eval Left Eval  Hip flexion 3+ 3+  Hip extension    Hip abduction 4 4  Hip adduction 4 4  Hip internal rotation    Hip external rotation    Knee flexion 3+ 4  Knee extension 4 4  Ankle dorsiflexion 3+ 3+  Ankle plantarflexion    Ankle inversion    Ankle eversion    (Blank rows = not tested)    TRANSFERS: Assistive device utilized: Environmental consultant - 4 wheeled  Sit to stand: Modified independence Stand to sit: Modified independence   GAIT: Gait pattern: step through pattern, decreased step length- Right, decreased stride length, and wide BOS Distance walked: 50 ft x 2 Assistive device utilized: Single point cane and Walker - 4 wheeled Level of assistance: SBA and CGA Comments: Pt brought in her cane (round tip base with SPC)  FUNCTIONAL TESTS:  5 times sit to stand: 13.63 sec arms crossed at chest Timed up and go (TUG): 17.37 with rollator; 20.91 sec with cane  10 meter walk test: 18.97 sec with rollator (1.73 ft/sec); 27.19 sec with cane (1.21 f/tsec) Berg Balance Scale: 32/56  GOALS: Goals reviewed with patient? Yes  SHORT TERM GOALS: Target date: 01/13/2024    Pt  will be independent with HEP for improved strength, balance, gait. Baseline: reports some days better than others Goal status: MET 09/22/23  2.  Pt will improve 5x sit<>stand to less than or equal to 12 sec to demonstrate improved functional strength and transfer efficiency. Baseline:  13.63 sec; 14.49 sec without UE support 09/22/23 Goal status: NOT MET 09/22/23  3.  Pt will improve Berg score to at least 38/56 to decrease fall risk. Baseline: 32/56; 49/56 09/22/23 Goal status: MET 09/22/23  4.  Pt will improve gait velocity to at least 2 ft/sec with rollator for improved gait efficiency and safety. Baseline: 1.72 ft/sec; 2.1 ft/sec 09/22/23 Goal status: MET 09/22/23  5.  Demo ability to pick up items from ground without UE support to improve safety in home  Baseline: UE support  Goal status: INITIAL  LONG TERM GOALS: Target date: 10/10/2023>UPDATED TARGET 11/21/2023>UPDATED TARGET 02/03/2024    Pt will be independent with progression of HEP for improved strength, balance, gait. Baseline: continues to be updated Goal status: IN PROGRESS  11/20/2023  2.  Pt will improve TUG score to less than or equal to 13.5 sec for decreased fall risk. Baseline: 15.9 sec cane 11/20/2023; 14 sec w/ cane Goal status: IN PROGRESS 12/23/23  3.  Pt will improve Berg score to at least 45/56 to decrease fall risk. Baseline: 32/56; 49/56 09/22/23 Goal status: MET 09/22/23  4.  Pt will improve gait velocity to at least 1.8 ft/sec with cane for improved gait efficiency and safety.   Baseline: 1.58 ft/sec with cane 09/22/23, 1.75 ft/sec with cane 10/10/2023; 2.05 ft/sec 11/11/2023 Goal status: MET 11/11/2023  5.  Pt will verbalize continued community fitness upon d/c from PT. Baseline: have provided education, not yet started Goal status: IN PROGRESS 12/23/23  6. Pt will improve DGI score to at least 18/24 to decrease fall risk.  Baseline:  11/24>15/24 11/20/2023; 16/24  Goal status:  IN PROGRESS 12/23/2023  7.  Pt  will negotiate single curb step with cane and modified independent for improved community mobility.  Baseline:  CGA 11/20/2023; Supervision w/ cane  Goal status:  REVISED 12/23/23  8.  Pt will improve gait velocity to at least 2.3 ft/sec for improved gait efficiency and safety.  Baseline:  2.07 ft/sec; 1.7 ft/sec w/ cane  Goal status:  IN PROGRESS, 12/23/23    9.  Pt will perform Condition 4 on MCTSIB 30 sec with mod sway or better for improved balance.    Baseline:  19 sec; moderate x 9 sec     Goal status:  IN PROGRESS 12/23/23  ASSESSMENT:  CLINICAL IMPRESSION: Session with focus on improving endurance to increase community level ambulation prioritizing lighter weight and higher repetiation count. Treadmill training w/ CGA performed in 2:1 format to increase gait speed/endurance with fatigue by 6 min of walking despite standing rest periods.  Continued sessions to progress POC details to improve mobility, balance, and activity tolerance  OBJECTIVE IMPAIRMENTS: Abnormal gait, decreased balance, decreased mobility, difficulty walking, and decreased strength.   ACTIVITY LIMITATIONS: bending, standing, squatting, transfers, bed mobility, and locomotion level  PARTICIPATION LIMITATIONS: driving, shopping, and community activity  PERSONAL FACTORS: 3+ comorbidities: see above are also affecting patient's functional outcome.   REHAB POTENTIAL: Good  CLINICAL DECISION MAKING: Evolving/moderate complexity  EVALUATION COMPLEXITY: Moderate  PLAN:  PT FREQUENCY: 1x/week  PT DURATION: 6 weeks per recert 12/23/23  PLANNED  INTERVENTIONS: 97750- Physical Performance Testing, 97110-Therapeutic exercises, 97530- Therapeutic activity, [id number]- Neuromuscular re-education, 97535- Self Care, [id number]- Manual therapy, [phone]- Gait training, Patient/Family education, and Balance training  PLAN FOR NEXT SESSION: gait w/out AD, squat lift items from floor, aerobics/breathing   11:15 AM, 01/20/24 M. Kelly  Dorlis Judice, PT, DPT Physical Therapist- Day Office Number: [phone]

## 2024-01-21 ENCOUNTER — Inpatient Hospital Stay

## 2024-01-21 ENCOUNTER — Inpatient Hospital Stay: Admitting: Internal Medicine

## 2024-01-21 ENCOUNTER — Inpatient Hospital Stay: Attending: Internal Medicine

## 2024-01-21 VITALS — HR 103

## 2024-01-21 VITALS — BP 121/85 | HR 110 | Temp 98.4°F | Resp 16 | Ht 63.0 in | Wt 161.0 lb

## 2024-01-21 DIAGNOSIS — J910 Malignant pleural effusion: Secondary | ICD-10-CM | POA: Insufficient documentation

## 2024-01-21 DIAGNOSIS — C7951 Secondary malignant neoplasm of bone: Secondary | ICD-10-CM

## 2024-01-21 DIAGNOSIS — Z79899 Other long term (current) drug therapy: Secondary | ICD-10-CM | POA: Insufficient documentation

## 2024-01-21 DIAGNOSIS — Z95828 Presence of other vascular implants and grafts: Secondary | ICD-10-CM

## 2024-01-21 DIAGNOSIS — J704 Drug-induced interstitial lung disorders, unspecified: Secondary | ICD-10-CM | POA: Insufficient documentation

## 2024-01-21 DIAGNOSIS — Z5111 Encounter for antineoplastic chemotherapy: Secondary | ICD-10-CM | POA: Insufficient documentation

## 2024-01-21 DIAGNOSIS — C3491 Malignant neoplasm of unspecified part of right bronchus or lung: Secondary | ICD-10-CM

## 2024-01-21 DIAGNOSIS — Z79890 Hormone replacement therapy (postmenopausal): Secondary | ICD-10-CM | POA: Diagnosis not present

## 2024-01-21 DIAGNOSIS — E039 Hypothyroidism, unspecified: Secondary | ICD-10-CM

## 2024-01-21 DIAGNOSIS — C787 Secondary malignant neoplasm of liver and intrahepatic bile duct: Secondary | ICD-10-CM | POA: Insufficient documentation

## 2024-01-21 DIAGNOSIS — C3431 Malignant neoplasm of lower lobe, right bronchus or lung: Secondary | ICD-10-CM | POA: Diagnosis not present

## 2024-01-21 DIAGNOSIS — R110 Nausea: Secondary | ICD-10-CM | POA: Diagnosis not present

## 2024-01-21 LAB — CBC WITH DIFFERENTIAL (CANCER CENTER ONLY)
Abs Immature Granulocytes: 0.02 K/uL (ref 0.00–0.07)
Basophils Absolute: 0 K/uL (ref 0.0–0.1)
Basophils Relative: 1 %
Eosinophils Absolute: 0.1 K/uL (ref 0.0–0.5)
Eosinophils Relative: 2 %
HCT: 41.5 % (ref 36.0–46.0)
Hemoglobin: 13.6 g/dL (ref 12.0–15.0)
Immature Granulocytes: 1 %
Lymphocytes Relative: 15 %
Lymphs Abs: 0.6 K/uL — ABNORMAL LOW (ref 0.7–4.0)
MCH: 28.5 pg (ref 26.0–34.0)
MCHC: 32.8 g/dL (ref 30.0–36.0)
MCV: 87 fL (ref 80.0–100.0)
Monocytes Absolute: 0.4 K/uL (ref 0.1–1.0)
Monocytes Relative: 9 %
Neutro Abs: 2.9 K/uL (ref 1.7–7.7)
Neutrophils Relative %: 72 %
Platelet Count: 213 K/uL (ref 150–400)
RBC: 4.77 MIL/uL (ref 3.87–5.11)
RDW: 14.6 % (ref 11.5–15.5)
WBC Count: 4 K/uL (ref 4.0–10.5)
nRBC: 0 % (ref 0.0–0.2)

## 2024-01-21 LAB — CMP (CANCER CENTER ONLY)
ALT: 13 U/L (ref 0–44)
AST: 17 U/L (ref 15–41)
Albumin: 4.3 g/dL (ref 3.5–5.0)
Alkaline Phosphatase: 92 U/L (ref 38–126)
Anion gap: 7 (ref 5–15)
BUN: 10 mg/dL (ref 8–23)
CO2: 29 mmol/L (ref 22–32)
Calcium: 9.2 mg/dL (ref 8.9–10.3)
Chloride: 104 mmol/L (ref 98–111)
Creatinine: 0.63 mg/dL (ref 0.44–1.00)
GFR, Estimated: >60 mL/min (ref >60)
Glucose, Bld: 112 mg/dL — ABNORMAL HIGH (ref 70–99)
Potassium: 3.5 mmol/L (ref 3.5–5.1)
Sodium: 140 mmol/L (ref 135–145)
Total Bilirubin: 0.6 mg/dL (ref 0.0–1.2)
Total Protein: 7 g/dL (ref 6.5–8.1)

## 2024-01-21 LAB — TSH: TSH: 2.98 u[IU]/mL (ref 0.350–4.500)

## 2024-01-21 LAB — T4, FREE: Free T4: 1.74 ng/dL — ABNORMAL HIGH (ref 0.61–1.12)

## 2024-01-21 MED ORDER — SODIUM CHLORIDE 0.9% FLUSH
10.0000 mL | Freq: Once | INTRAVENOUS | Status: AC
Start: 2024-01-21 — End: 2024-01-21
  Administered 2024-01-21 (×2): 10 mL

## 2024-01-21 MED ORDER — PROCHLORPERAZINE MALEATE 10 MG PO TABS
10.0000 mg | ORAL_TABLET | Freq: Once | ORAL | Status: AC
  Administered 2024-01-21 (×2): 10 mg via ORAL
  Filled 2024-01-21: qty 1

## 2024-01-21 MED ORDER — DEXAMETHASONE SODIUM PHOSPHATE 10 MG/ML IJ SOLN
10.0000 mg | Freq: Once | INTRAMUSCULAR | Status: AC
  Administered 2024-01-21 (×2): 10 mg via INTRAVENOUS
  Filled 2024-01-21: qty 1

## 2024-01-21 MED ORDER — SODIUM CHLORIDE 0.9 % IV SOLN
Freq: Once | INTRAVENOUS | Status: AC
Start: 2024-01-21 — End: 2024-01-21

## 2024-01-21 MED ORDER — SODIUM CHLORIDE 0.9 % IV SOLN
500.0000 mg/m2 | Freq: Once | INTRAVENOUS | Status: AC
  Administered 2024-01-21 (×2): 900 mg via INTRAVENOUS
  Filled 2024-01-21: qty 20

## 2024-01-21 NOTE — Patient Instructions (Signed)
 [facility] - A DEPT OF [facility]  Discharge Instructions: Thank you for choosing [facility] to provide your oncology and hematology care.   If you have a lab appointment with the Cancer Center, please go directly to the Cancer Center and check in at the registration area.   Wear comfortable clothing and clothing appropriate for easy access to any Portacath or PICC line.   We strive to give you quality time with your provider. You may need to reschedule your appointment if you arrive late (15 or more minutes).  Arriving late affects you and other patients whose appointments are after yours.  Also, if you miss three or more appointments without notifying the office, you may be dismissed from the clinic at the provider's discretion.      For prescription refill requests, have your pharmacy contact our office and allow 72 hours for refills to be completed.    Today you received the following chemotherapy and/or immunotherapy agents Alimta      To help prevent nausea and vomiting after your treatment, we encourage you to take your nausea medication as directed.  BELOW ARE SYMPTOMS THAT SHOULD BE REPORTED IMMEDIATELY: *FEVER GREATER THAN 100.4 F (38 C) OR HIGHER *CHILLS OR SWEATING *NAUSEA AND VOMITING THAT IS NOT CONTROLLED WITH YOUR NAUSEA MEDICATION *UNUSUAL SHORTNESS OF BREATH *UNUSUAL BRUISING OR BLEEDING *URINARY PROBLEMS (pain or burning when urinating, or frequent urination) *BOWEL PROBLEMS (unusual diarrhea, constipation, pain near the anus) TENDERNESS IN MOUTH AND THROAT WITH OR WITHOUT PRESENCE OF ULCERS (sore throat, sores in mouth, or a toothache) UNUSUAL RASH, SWELLING OR PAIN  UNUSUAL VAGINAL DISCHARGE OR ITCHING   Items with * indicate a potential emergency and should be followed up as soon as possible or go to the Emergency Department if any problems should occur.  Please show the CHEMOTHERAPY ALERT CARD or IMMUNOTHERAPY  ALERT CARD at check-in to the Emergency Department and triage nurse.  Should you have questions after your visit or need to cancel or reschedule your appointment, please contact [facility] - A DEPT OF [facility]  Dept: [phone]  and follow the prompts.  Office hours are 8:00 a.m. to 4:30 p.m. Monday - Friday. Please note that voicemails left after 4:00 p.m. may not be returned until the following business day.  We are closed weekends and major holidays. You have access to a nurse at all times for urgent questions. Please call the main number to the clinic Dept: [phone] and follow the prompts.   For any non-urgent questions, you may also contact your provider using MyChart. We now offer e-Visits for anyone 79 and older to request care online for non-urgent symptoms. For details visit [email or web address].   Also download the MyChart app! Go to the app store, search "MyChart", open the app, select [name], and log in with your MyChart username and password.

## 2024-01-21 NOTE — Progress Notes (Signed)
 [facility] [phone]   [phone]  OFFICE PROGRESS NOTE  Wu, Hayley POUR, FNP [address] [place] [address]  DIAGNOSIS: Stage IV (T1b, N2, M1 C) non-small cell lung cancer, adenocarcinoma presented with right lower lobe lung nodule in addition to small bilateral pulmonary nodules and right hilar and mediastinal lymphadenopathy in addition to liver and bone metastasis diagnosed in April 2024. 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   PDL1: 20%   Guardant 360: no actionable mutations.    PRIOR THERAPY: 1) Decompressive thoracic laminectomy of the T7 with spinal cord decompression and biopsy of the epidural mass in April 2024.  2) Palliative radiation to the bone under the care of Dr. Izell. Last day scheduled for 10/25/22    CURRENT THERAPY: Carboplatin  for an AUC of 5, Alimta  500 mg/m, Keytruda  200 mg IV every 3 weeks.  First dose expected on 10/28/2022. Status post 19 cycles.  Starting from cycle #5 the patient is on maintenance treatment with Alimta  and Keytruda  every 3 weeks.  Starting from cycle #7 the patient will be treated with single agent Alimta  500 Mg/M2 every 3 weeks.  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  INTERVAL HISTORY: Hayley Wu 73 y.o. female returns to the clinic today for follow-up visit.  Discussed the use of AI scribe software for clinical note transcription with the patient, who gave verbal consent to proceed.  History of Present Illness Hayley Wu is a 73 year old female with stage four non-small cell lung cancer who presents for evaluation before cycle number twenty of her treatment. She is accompanied by her daughter.  Diagnosed with stage four non-small cell lung cancer, adenocarcinoma, in April 2024, she initially underwent palliative systemic  chemo immunotherapy with carboplatin , Alimta , and Keytruda  for four cycles. Since then, she has been on maintenance treatment with single agent Alimta . 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  She is here for evaluation before her twentieth cycle of treatment. No new chest pain, breathing issues, nausea, vomiting, rash, or diarrhea since her last visit. She has been taking her nausea medication for a longer duration after experiencing some nausea previously.  A recent CT scan of the chest, abdomen, and pelvis was performed a few days ago, but the results have not yet been read.       MEDICAL HISTORY: Past Medical History:  Diagnosis Date   Allergy    Contact lens/glasses fitting    Hypertension     ALLERGIES:  is allergic to erythromycin, amoxicillin, latex, and prednisone .  MEDICATIONS:  Current Outpatient Medications  Medication Sig Dispense Refill   acetaminophen  (TYLENOL ) 500 MG tablet Take 500 mg by mouth every 6 (six) hours as needed for moderate pain.     amLODipine  (NORVASC ) 10 MG tablet Take 10 mg by mouth daily.     apixaban  (ELIQUIS ) 5 MG TABS tablet Take 1 tablet (5 mg total) by mouth 2 (two) times daily. 60 tablet 2   folic acid  (FOLVITE ) 1 MG tablet Take 1 tablet (1 mg total) by mouth daily. 90 tablet 1   Ipratropium-Albuterol  (COMBIVENT ) 20-100 MCG/ACT AERS respimat Inhale 1 puff into the lungs every 6 (six) hours as needed for wheezing or shortness of breath. 4 g 1   levothyroxine  (SYNTHROID ) 112 MCG tablet Take 112 mcg by mouth daily before breakfast.  levothyroxine  (SYNTHROID ) 150 MCG tablet Take 150 mcg by mouth daily before breakfast.     lidocaine -prilocaine  (EMLA ) cream Apply 1 Application topically as needed. 30 g 2   mirtazapine  (REMERON ) 30 MG tablet TAKE 1 TABLET BY MOUTH AT BEDTIME 90 tablet 0   potassium chloride  SA (KLOR-CON  M) 20 MEQ tablet Take 1 tablet (20 mEq total) by mouth daily. (Patient not taking: Reported on  01/01/2024) 6 tablet 0   prochlorperazine  (COMPAZINE ) 10 MG tablet Take 1 tablet (10 mg total) by mouth every 6 (six) hours as needed. 30 tablet 2   VITAMIN D PO Take 1 tablet by mouth daily.     No current facility-administered medications for this visit.    SURGICAL HISTORY:  Past Surgical History:  Procedure Laterality Date   ABDOMINAL HYSTERECTOMY     BREAST SURGERY  2010   rt lumpectomy-neg   IR IMAGING GUIDED PORT INSERTION  12/18/2022   LAMINECTOMY N/A 09/21/2022   Procedure: THORACIC LAMINECTOMY FOR TUMOR;  Surgeon: Joshua Alm RAMAN, MD;  Location: [facility] OR;  Service: Neurosurgery;  Laterality: N/A;   OPEN REDUCTION INTERNAL FIXATION (ORIF) DISTAL RADIAL FRACTURE Left 11/26/2013   Procedure: OPEN REDUCTION INTERNAL FIXATION (ORIF) LEFT  DISTAL RADIUS ;  Surgeon: Dempsey JINNY Sensor, MD;  Location: [facility];  Service: Orthopedics;  Laterality: Left;  with block   TUBAL LIGATION      REVIEW OF SYSTEMS:  Constitutional: negative Eyes: negative Ears, nose, mouth, throat, and face: negative Respiratory: negative Cardiovascular: negative Gastrointestinal: positive for nausea Genitourinary:negative Integument/breast: negative Hematologic/lymphatic: negative Musculoskeletal:negative Neurological: negative Behavioral/Psych: negative Endocrine: negative Allergic/Immunologic: negative   PHYSICAL EXAMINATION: General appearance: alert, cooperative, fatigued, and no distress Head: Normocephalic, without obvious abnormality, atraumatic Neck: no adenopathy, no JVD, supple, symmetrical, trachea midline, and thyroid  not enlarged, symmetric, no tenderness/mass/nodules Lymph nodes: Cervical, supraclavicular, and axillary nodes normal. Resp: clear to auscultation bilaterally Back: symmetric, no curvature. ROM normal. No CVA tenderness. Cardio: regular rate and rhythm, S1, S2 normal, no murmur, click, rub or gallop GI: soft, non-tender; bowel sounds normal; no masses,  no  organomegaly Extremities: extremities normal, atraumatic, no cyanosis or edema Neurologic: Alert and oriented X 3, normal strength and tone. Normal symmetric reflexes. Normal coordination and gait  ECOG PERFORMANCE STATUS: 1 - Symptomatic but completely ambulatory  Blood pressure 121/85, pulse (!) 110, temperature 98.4 F (36.9 C), temperature source Temporal, resp. rate 16, height 5' 3 (1.6 m), weight 161 lb (73 kg), SpO2 96%.  LABORATORY DATA: Lab Results  Component Value Date   WBC 3.8 (L) 01/01/2024   HGB 13.3 01/01/2024   HCT 41.1 01/01/2024   MCV 88.2 01/01/2024   PLT 242 01/01/2024      Chemistry      Component Value Date/Time   NA 141 01/01/2024 1058   K 3.6 01/01/2024 1058   CL 106 01/01/2024 1058   CO2 29 01/01/2024 1058   BUN 12 01/01/2024 1058   CREATININE 0.72 01/01/2024 1058      Component Value Date/Time   CALCIUM 9.3 01/01/2024 1058   ALKPHOS 86 01/01/2024 1058   AST 19 01/01/2024 1058   ALT 12 01/01/2024 1058   BILITOT 0.5 01/01/2024 1058       RADIOGRAPHIC STUDIES: No results found.    ASSESSMENT AND PLAN: This is a very pleasant 73 years old white female with Stage IV (T1b, N2, M1 C) non-small cell lung cancer, adenocarcinoma presented with right lower lobe lung nodule in addition to small bilateral  pulmonary nodules and right hilar and mediastinal lymphadenopathy in addition to liver and bone metastasis diagnosed in April 2024. 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 Molecular studies showed no actionable mutations and PD-L1 expression of 20%. 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  She also had palliative radiation to the bone under the care of Dr. Izell. Last day scheduled for 10/25/22. She is currently on palliative systemic chemotherapy  with carboplatin  for AUC of 5, Alimta  500 Mg/M2 and Keytruda  200 Mg IV every 3 weeks status post 19 cycles.  Starting from cycle #5 the patient is on maintenance treatment with Alimta  and Keytruda  every 3 weeks.  After cycle #6 Keytruda  was discontinued secondary to suspicious immunotherapy mediated pneumonitis with recent hospitalization and acute respiratory failure. She continues to tolerate her treatment with single agent Alimta  fairly well. She had repeat CT scan of the chest, chest performed recently.  I personally and independently reviewed the scan images and discussed the result with the patient and her daughter.  Unfortunately the final report of the scan is still pending I did not see any clear evidence for disease progression except for the enlarging right pleural effusion. I will wait for the final report for confirmation. Assessment and Plan Assessment & Plan Stage IV non-small cell lung cancer, adenocarcinoma Stage IV non-small cell lung cancer, adenocarcinoma, diagnosed in April 2024. Currently on maintenance treatment with single agent Alimta  after initial treatment with carboplatin , Alimta , and Keytruda . 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 Recent CT scan of the chest, abdomen, and pelvis shows similar findings with possible increase in fluid around the right side. Awaiting official radiology report for confirmation. - Continue maintenance treatment with Alimta . - Await official radiology report for CT scan findings.  Malignant right-sided pleural effusion Possible increase in fluid around the right side noted on recent CT scan. No current symptoms of shortness of breath. - Monitor for symptoms of shortness of breath. - Consider drainage if shortness of breath develops.  Chemotherapy-induced nausea (controlled) Nausea previously experienced but currently controlled with medication. No new complaints of nausea, vomiting, rash, or diarrhea. - Continue  current anti-nausea medication regimen. She was advised to call immediately if she has any other concerning symptoms in the interval.  The patient voices understanding of current disease status and treatment options and is in agreement with the current care plan.  All questions were answered. The patient knows to call the clinic with any problems, questions or concerns. We can certainly see the patient much sooner if necessary.  The total time spent in the appointment was 30 minutes.  Disclaimer: This note was dictated with voice recognition software. Similar sounding words can inadvertently be transcribed and may not be corrected upon review.

## 2024-01-27 ENCOUNTER — Telehealth: Payer: Self-pay

## 2024-01-27 ENCOUNTER — Ambulatory Visit

## 2024-01-27 DIAGNOSIS — R2689 Other abnormalities of gait and mobility: Secondary | ICD-10-CM

## 2024-01-27 DIAGNOSIS — M6281 Muscle weakness (generalized): Secondary | ICD-10-CM

## 2024-01-27 DIAGNOSIS — R2681 Unsteadiness on feet: Secondary | ICD-10-CM

## 2024-01-27 DIAGNOSIS — R262 Difficulty in walking, not elsewhere classified: Secondary | ICD-10-CM

## 2024-01-27 DIAGNOSIS — R29898 Other symptoms and signs involving the musculoskeletal system: Secondary | ICD-10-CM

## 2024-01-27 NOTE — Therapy (Signed)
 OUTPATIENT PHYSICAL THERAPY NEURO TREATMENT   Patient Name: Hayley Wu MRN: [id number] DOB:1950/11/05, 73 y.o., female Today's Date: 01/27/2024  PCP: Lanis Thresa JAYSON DEVONNA REFERRING PROVIDER: Crecencio [name], FNP     END OF SESSION:  PT End of Session - 01/27/24 1016     Visit Number 33    Number of Visits 34    Date for PT Re-Evaluation 02/03/24    Authorization Type [employer]    Progress Note Due on Visit 38    PT Start Time 1015    PT Stop Time 1100    PT Time Calculation (min) 45 min    Equipment Utilized During Treatment Gait belt    Activity Tolerance Patient tolerated treatment well    Behavior During Therapy WFL for tasks assessed/performed                Past Medical History:  Diagnosis Date   Allergy    Contact lens/glasses fitting    Hypertension    Past Surgical History:  Procedure Laterality Date   ABDOMINAL HYSTERECTOMY     BREAST SURGERY  2010   rt lumpectomy-neg   IR IMAGING GUIDED PORT INSERTION  12/18/2022   LAMINECTOMY N/A 09/21/2022   Procedure: THORACIC LAMINECTOMY FOR TUMOR;  Surgeon: Joshua Alm RAMAN, MD;  Location: [facility] OR;  Service: Neurosurgery;  Laterality: N/A;   OPEN REDUCTION INTERNAL FIXATION (ORIF) DISTAL RADIAL FRACTURE Left 11/26/2013   Procedure: OPEN REDUCTION INTERNAL FIXATION (ORIF) LEFT  DISTAL RADIUS ;  Surgeon: Dempsey JINNY Sensor, MD;  Location: [facility];  Service: Orthopedics;  Laterality: Left;  with block   TUBAL LIGATION     Patient Active Problem List   Diagnosis Date Noted   Acute deep vein thrombosis (DVT) of femoral vein of both lower extremities (HCC) 03/19/2023   Normocytic anemia 03/01/2023   Moderate protein malnutrition (HCC) 03/01/2023   Acute respiratory failure with hypoxia (HCC) 03/01/2023   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   Cardiomegaly 03/01/2023   Pleural effusion due to CHF (congestive heart failure) (HCC) 03/01/2023   Port-A-Cath in place  12/30/2022   Encounter for antineoplastic immunotherapy 11/18/2022   Encounter for antineoplastic chemotherapy 11/18/2022   Constipation 11/05/2022   Goals of care, counseling/discussion 10/21/2022   Cancer, metastatic to bone (HCC) 10/04/2022   Adenocarcinoma of right lung, stage 4 (HCC) 10/01/2022   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 09/20/2022   Hypokalemia 09/20/2022   Lung mass 09/20/2022   Hypothyroidism 09/20/2022   Cholelithiasis 09/20/2022   Lumbar spondylosis 08/26/2022   Thoracic back pain 04/04/2022   Low back pain 04/04/2022   Hypertension 09/23/2011    ONSET DATE: 08/08/2023 (MD order)  REFERRING DIAG:  Z91.81 (ICD-10-CM) - History of falling  C34.91 (ICD-10-CM) - Malignant neoplasm of unspecified part of right bronchus or lung    THERAPY DIAG:  Other abnormalities of gait and mobility  Unsteadiness on feet  Muscle weakness (generalized)  Other symptoms and signs involving the musculoskeletal system  Difficulty in walking, not elsewhere classified  Rationale for Evaluation and Treatment: Rehabilitation  SUBJECTIVE:  SUBJECTIVE STATEMENT: Low energy/strength from chemo last week. Have fluid on the lung, no SOB slight DOE  Pt accompanied by: self  PERTINENT HISTORY: Stage IV (T1b, N2, M1 C) non-small cell lung cancer, adenocarcinoma presented with right lower lobe lung nodule in addition to small bilateral pulmonary nodules and right hilar and mediastinal lymphadenopathy in addition to liver and bone metastasis diagnosed in April 2024. She is status post decompressive thoracic laminectomy of the T7 with spinal cord decompression and  biopsy of the epidural mass in April 2024.    PAIN:  Are you having pain? Yes: NPRS scale: no/10 Pain location: low back Pain description: sore Aggravating factors: bending, sitting wrong  Relieving factors: tylenol    PRECAUTIONS: Fall and Other: currently in treatment for lung cancer  RED FLAGS: None   WEIGHT BEARING RESTRICTIONS: No  FALLS: Has patient fallen in last 6 months? No  LIVING ENVIRONMENT: Lives with: lives alone Lives in: House/apartment Stairs: 1 step to get into home Has following equipment at home: Single point cane, Environmental consultant - 4 wheeled, Wheelchair (manual), and shower chair  PLOF: Independent with household mobility with device and Independent with community mobility with device  PATIENT GOALS: Still need to work on balance, endurance, and would like to work on cane more.  OBJECTIVE:   TODAY'S TREATMENT: 01/27/24 Activity Comments  93%, 110 bpm   LE PRE LAQ 3x10, 4# Hip add iso 3x10 Hip abd/ER double green loop 3x10 Hamstring curls double green 3x10 Alt stair taps 2x60 sec, 4#, 6 box Sidestepping 2x60 sec, 4# at counter for support  93%, 115 bpm during exertion   Upper body strengt -seated row 1x10: 10-20# -seated lat row 3x10 25# Standing single arm row 2x10 10#           TODAY'S TREATMENT: 01/20/24 Activity Comments  LE PRE-endurance 3x12 -LAQ 3# -hip add iso -seated hip abd, green -seated hamstring curls, double green -standing alt stair taps, 3#, 6  Modified deadlift 1x12 8#, 4  Treadmill training 2 min intervals 1.6 mph w/ 60 sec rest between. 3 rounds (6 min walking). HR 119 bpm Cues for pursed lip breathing               TODAY'S TREATMENT: 01/13/24 Activity Comments  Vitals at start of session 130/81 mmHg, 119 bpm 93% spO2 Pt denies chest pain, SOB, dizziness. She reports tachycardia at baseline and was told it is due to all the medications I'm taking  gait training on TM for endurance and dynamic balance while monitoring  vitals, 1.1-1.3 mph B UE support; cueing for longer rather than quicker steps  After 3 min: 86%, 119-130 bpm  Allowed standing, then sitting rest break and cueing for purse lip breathing until O2 reached >90% and HR went down to baseline   gait, picking up beanbags from the floor  CGA; occasionally reaching to lean on items in gym for stability  Cornhole bending to each side to pick up beanbags from basket Good stability. Cueing to bend through knees rather than spine. Sitting rest break d/t fatigue  95% spO2, 114bpm   squat to lift small step stool from the floor, simulating lifting a box from the floor Required CGA and cueing for proper mechanics, maintaining chest upright rather than leaning down   gait + head turns/nods with and without SPC  Good stability with cane, required CGA without cane d/t mild instability   86% spO2, 124bpm Allowed sitting rest break until O2 reached >90% and HR went down  to baseline     PATIENT EDUCATION: Education details: discussed normal vital signs and to discuss pt's O2 and HR with oncologist at next scheduled appt on 01/21/24 Person educated: Patient Education method: Explanation Education comprehension: verbalized understanding     HOME EXERCISE PROGRAM: Access Code: [email or web address] URL: [email or web address] Date: 11/18/2023 Prepared by: [facility] - Outpatient  Rehab - [facility]  Program Notes practice walking inside the house with your cane for 5 minutes per day.   Exercises - Seated Hamstring Curls with Resistance  - 1 x daily - 5 x weekly - 3 sets - 10 reps - Seated Knee Extension with Resistance  - 1 x daily - 5 x weekly - 3 sets - 10 reps - Seated Hip Abduction with Resistance  - 1 x daily - 5 x weekly - 2 sets - 10 reps - Side Stepping with Counter Support  - 1 x daily - 5 x weekly - 2-3 sets - 10 reps - Alternating Step Taps with Counter Support  - 1 x daily - 5 x weekly - 3 sets - 10 reps - Backward Walking with  Counter Support  - 1 x daily - 5 x weekly - 1 sets - 5 reps - Squat with Chair Touch  - 1 x daily - 5 x weekly - 2 sets - 10 reps - Corner Balance Feet Together With Eyes Open  - 1 x daily - 5 x weekly - 2 sets - 30 sec hold - Corner Balance Feet Apart: Eyes Open With Head Turns  - 1 x daily - 5 x weekly - 2 sets - 30 sec hold - Romberg Stance with Head Nods  - 1 x daily - 5 x weekly - 2 sets - 30 sec hold - Stride Stance Weight Shift  - 1-2 x daily - 7 x weekly - 1-2 sets - 10 reps - Standing Gastroc Stretch on Step  - 1 x daily - 7 x weekly - 1 sets - 3 reps - 15-30 sec hold - Seated Figure 4 Piriformis Stretch  - 1 x daily - 7 x weekly - 1 sets - 3 reps - 15 sec hold       Note: Objective measures were completed at Evaluation unless otherwise noted.  DIAGNOSTIC FINDINGS: NA for this episode  COGNITION: Overall cognitive status: Within functional limits for tasks assessed   SENSATION: Light touch: WFL Sometimes reports pressure/heaviness in feet  MUSCLE TONE: WFL BLEs   POSTURE: rounded shoulders and forward head  LOWER EXTREMITY ROM:   ACTIVE ROM WFL  LOWER EXTREMITY MMT:    MMT Right Eval Left Eval  Hip flexion 3+ 3+  Hip extension    Hip abduction 4 4  Hip adduction 4 4  Hip internal rotation    Hip external rotation    Knee flexion 3+ 4  Knee extension 4 4  Ankle dorsiflexion 3+ 3+  Ankle plantarflexion    Ankle inversion    Ankle eversion    (Blank rows = not tested)    TRANSFERS: Assistive device utilized: Environmental consultant - 4 wheeled  Sit to stand: Modified independence Stand to sit: Modified independence   GAIT: Gait pattern: step through pattern, decreased step length- Right, decreased stride length, and wide BOS Distance walked: 50 ft x 2 Assistive device utilized: Single point cane and Walker - 4 wheeled Level of assistance: SBA and CGA Comments: Pt brought in her cane (round tip base with SPC)  FUNCTIONAL TESTS:  5 times  sit to stand: 13.63 sec  arms crossed at chest Timed up and go (TUG): 17.37 with rollator; 20.91 sec with cane  10 meter walk test: 18.97 sec with rollator (1.73 ft/sec); 27.19 sec with cane (1.21 f/tsec) Lars Balance Scale: 32/56                                                                                                                                         GOALS: Goals reviewed with patient? Yes  SHORT TERM GOALS: Target date: 01/13/2024    Pt will be independent with HEP for improved strength, balance, gait. Baseline: reports some days better than others Goal status: MET 09/22/23  2.  Pt will improve 5x sit<>stand to less than or equal to 12 sec to demonstrate improved functional strength and transfer efficiency. Baseline:  13.63 sec; 14.49 sec without UE support 09/22/23 Goal status: NOT MET 09/22/23  3.  Pt will improve Berg score to at least 38/56 to decrease fall risk. Baseline: 32/56; 49/56 09/22/23 Goal status: MET 09/22/23  4.  Pt will improve gait velocity to at least 2 ft/sec with rollator for improved gait efficiency and safety. Baseline: 1.72 ft/sec; 2.1 ft/sec 09/22/23 Goal status: MET 09/22/23  5.  Demo ability to pick up items from ground without UE support to improve safety in home  Baseline: UE support  Goal status: INITIAL  LONG TERM GOALS: Target date: 10/10/2023>UPDATED TARGET 11/21/2023>UPDATED TARGET 02/03/2024    Pt will be independent with progression of HEP for improved strength, balance, gait. Baseline: continues to be updated Goal status: IN PROGRESS  11/20/2023  2.  Pt will improve TUG score to less than or equal to 13.5 sec for decreased fall risk. Baseline: 15.9 sec cane 11/20/2023; 14 sec w/ cane Goal status: IN PROGRESS 12/23/23  3.  Pt will improve Berg score to at least 45/56 to decrease fall risk. Baseline: 32/56; 49/56 09/22/23 Goal status: MET 09/22/23  4.  Pt will improve gait velocity to at least 1.8 ft/sec with cane for improved gait efficiency and  safety.   Baseline: 1.58 ft/sec with cane 09/22/23, 1.75 ft/sec with cane 10/10/2023; 2.05 ft/sec 11/11/2023 Goal status: MET 11/11/2023  5.  Pt will verbalize continued community fitness upon d/c from PT. Baseline: have provided education, not yet started Goal status: IN PROGRESS 12/23/23  6. Pt will improve DGI score to at least 18/24 to decrease fall risk.  Baseline:  11/24>15/24 11/20/2023; 16/24  Goal status:  IN PROGRESS 12/23/2023  7.  Pt will negotiate single curb step with cane and modified independent for improved community mobility.  Baseline:  CGA 11/20/2023; Supervision w/ cane  Goal status:  REVISED 12/23/23  8.  Pt will improve gait velocity to at least 2.3 ft/sec for improved gait efficiency and safety.  Baseline:  2.07 ft/sec; 1.7 ft/sec w/ cane  Goal status:  IN PROGRESS, 12/23/23    9.  Pt will perform Condition 4 on MCTSIB 30 sec with mod sway or better for improved balance.    Baseline:  19 sec; moderate x 9 sec     Goal status:  IN PROGRESS 12/23/23  ASSESSMENT:  CLINICAL IMPRESSION: Strength training to improve activity tolerance and power to improve mobility. Upper body strength to increase arm strength for lifting/pulling motions. Continued sessions to progess POC details to improve balance/mobility, activity tolerance/endurance and progressing with close attention to vital signs.   OBJECTIVE IMPAIRMENTS: Abnormal gait, decreased balance, decreased mobility, difficulty walking, and decreased strength.   ACTIVITY LIMITATIONS: bending, standing, squatting, transfers, bed mobility, and locomotion level  PARTICIPATION LIMITATIONS: driving, shopping, and community activity  PERSONAL FACTORS: 3+ comorbidities: see above are also affecting patient's functional outcome.   REHAB POTENTIAL: Good  CLINICAL DECISION MAKING: Evolving/moderate complexity  EVALUATION COMPLEXITY: Moderate  PLAN:  PT FREQUENCY: 1x/week  PT DURATION: 6 weeks per recert 12/23/23  PLANNED  INTERVENTIONS: 97750- Physical Performance Testing, 97110-Therapeutic exercises, 97530- Therapeutic activity, 97112- Neuromuscular re-education, 97535- Self Care, [id number]- Manual therapy, [phone]- Gait training, Patient/Family education, and Balance training  PLAN FOR NEXT SESSION: gait w/out AD, squat lift items from floor, aerobics/breathing   10:16 AM, 01/27/24 M. Kelly Json Koelzer, PT, DPT Physical Therapist- [place] Office Number: [phone]

## 2024-01-27 NOTE — Telephone Encounter (Signed)
 Spoke with patient regarding recent CT scan results, which showed a pleural effusion. Call was made to assess current symptoms. Patient denies any shortness of breath or difficulty breathing at this time. Instructed patient to contact our office if she begins to experience shortness of breath, and informed her that a thoracentesis can be scheduled if needed. Patient voiced understanding.

## 2024-02-03 ENCOUNTER — Ambulatory Visit

## 2024-02-03 DIAGNOSIS — R2689 Other abnormalities of gait and mobility: Secondary | ICD-10-CM | POA: Diagnosis not present

## 2024-02-03 DIAGNOSIS — R262 Difficulty in walking, not elsewhere classified: Secondary | ICD-10-CM

## 2024-02-03 DIAGNOSIS — R29898 Other symptoms and signs involving the musculoskeletal system: Secondary | ICD-10-CM

## 2024-02-03 DIAGNOSIS — R2681 Unsteadiness on feet: Secondary | ICD-10-CM

## 2024-02-03 DIAGNOSIS — M6281 Muscle weakness (generalized): Secondary | ICD-10-CM

## 2024-02-03 NOTE — Therapy (Signed)
 OUTPATIENT PHYSICAL THERAPY NEURO TREATMENT, Progress Note, Recertification   Patient Name: Hayley Wu MRN: [id number] DOB:February 06, 1951, 73 y.o., female Today's Date: 02/03/2024  PCP: Lanis Thresa JAYSON DEVONNA REFERRING PROVIDER: Crecencio [name], FNP    Progress Note Reporting Period 12/23/23 to 02/03/24  See note below for Objective Data and Assessment of Progress/Goals.     END OF SESSION:  PT End of Session - 02/03/24 1025     Visit Number 34    Number of Visits 42    Date for PT Re-Evaluation 03/30/24    Authorization Type [employer]    Progress Note Due on Visit 44    PT Start Time 1020    PT Stop Time 1100    PT Time Calculation (min) 40 min    Equipment Utilized During Treatment Gait belt    Activity Tolerance Patient tolerated treatment well    Behavior During Therapy WFL for tasks assessed/performed                Past Medical History:  Diagnosis Date   Allergy    Contact lens/glasses fitting    Hypertension    Past Surgical History:  Procedure Laterality Date   ABDOMINAL HYSTERECTOMY     BREAST SURGERY  2010   rt lumpectomy-neg   IR IMAGING GUIDED PORT INSERTION  12/18/2022   LAMINECTOMY N/A 09/21/2022   Procedure: THORACIC LAMINECTOMY FOR TUMOR;  Surgeon: Joshua Alm RAMAN, MD;  [facility];  Service: Neurosurgery;  Laterality: N/A;   OPEN REDUCTION INTERNAL FIXATION (ORIF) DISTAL RADIAL FRACTURE Left 11/26/2013   Procedure: OPEN REDUCTION INTERNAL FIXATION (ORIF) LEFT  DISTAL RADIUS ;  Surgeon: Dempsey JINNY Sensor, MD;  Location: [facility];  Service: Orthopedics;  Laterality: Left;  with block   TUBAL LIGATION     Patient Active Problem List   Diagnosis Date Noted   Acute deep vein thrombosis (DVT) of femoral vein of both lower extremities (HCC) 03/19/2023   Normocytic anemia 03/01/2023   Moderate protein malnutrition (HCC) 03/01/2023   Acute respiratory failure with hypoxia (HCC) 03/01/2023   Occlusion of left pulmonary  artery (HCC) 03/01/2023   Cardiomegaly 03/01/2023   Pleural effusion due to CHF (congestive heart failure) (HCC) 03/01/2023   Port-A-Cath in place 12/30/2022   Encounter for antineoplastic immunotherapy 11/18/2022   Encounter for antineoplastic chemotherapy 11/18/2022   Constipation 11/05/2022   Goals of care, counseling/discussion 10/21/2022   Cancer, metastatic to bone (HCC) 10/04/2022   Adenocarcinoma of right lung, stage 4 (HCC) 10/01/2022   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 09/20/2022   Hypokalemia 09/20/2022   Lung mass 09/20/2022   Hypothyroidism 09/20/2022   Cholelithiasis 09/20/2022   Lumbar spondylosis 08/26/2022   Thoracic back pain 04/04/2022   Low back pain 04/04/2022   Hypertension 09/23/2011    ONSET DATE: 08/08/2023 (MD order)  REFERRING DIAG:  Z91.81 (ICD-10-CM) - History of falling  C34.91 (ICD-10-CM) - Malignant neoplasm of unspecified part of right bronchus or lung    THERAPY DIAG:  Other abnormalities of gait and mobility  Unsteadiness on feet  Muscle weakness (generalized)  Other symptoms and signs involving the musculoskeletal system  Difficulty in walking, not elsewhere classified  Rationale for Evaluation and Treatment: Rehabilitation  SUBJECTIVE:  SUBJECTIVE STATEMENT: Doing ok, no falls. Energy level better today  Pt accompanied by: self  PERTINENT HISTORY: Stage IV (T1b, N2, M1 C) non-small cell lung cancer, adenocarcinoma presented with right lower lobe lung nodule in addition to small bilateral pulmonary nodules and right hilar and mediastinal lymphadenopathy in addition to liver and bone metastasis diagnosed in  April 2024. 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    PAIN:  Are you having pain? Yes: NPRS scale: no/10 Pain location: low back Pain description: sore Aggravating factors: bending, sitting wrong  Relieving factors: tylenol    PRECAUTIONS: Fall and Other: currently in treatment for lung cancer  RED FLAGS: None   WEIGHT BEARING RESTRICTIONS: No  FALLS: Has patient fallen in last 6 months? No  LIVING ENVIRONMENT: Lives with: lives alone Lives in: House/apartment Stairs: 1 step to get into home Has following equipment at home: Single point cane, Environmental consultant - 4 wheeled, Wheelchair (manual), and shower chair  PLOF: Independent with household mobility with device and Independent with community mobility with device  PATIENT GOALS: Still need to work on balance, endurance, and would like to work on cane more.  OBJECTIVE:   TODAY'S TREATMENT: 02/03/24 Activity Comments  LTG performance/review   Pt education regarding relevant exercise videos to access on Youtube                     PATIENT EDUCATION: Education details: discussed normal vital signs and to discuss pt's O2 and HR with oncologist at next scheduled appt on 01/21/24 Person educated: Patient Education method: Explanation Education comprehension: verbalized understanding     HOME EXERCISE PROGRAM: Access Code: [email or web address] URL: [email or web address] Date: 11/18/2023 Prepared by: [facility] - [facility]  Program Notes practice walking inside the house with your cane for 5 minutes per day.   Exercises - Seated Hamstring Curls with Resistance  - 1 x daily - 5 x weekly - 3 sets - 10 reps - Seated Knee Extension with Resistance  - 1 x daily - 5 x weekly - 3 sets - 10 reps - Seated Hip Abduction with Resistance  - 1 x daily - 5 x weekly - 2 sets - 10 reps - Side Stepping with  Counter Support  - 1 x daily - 5 x weekly - 2-3 sets - 10 reps - Alternating Step Taps with Counter Support  - 1 x daily - 5 x weekly - 3 sets - 10 reps - Backward Walking with Counter Support  - 1 x daily - 5 x weekly - 1 sets - 5 reps - Squat with Chair Touch  - 1 x daily - 5 x weekly - 2 sets - 10 reps - Corner Balance Feet Together With Eyes Open  - 1 x daily - 5 x weekly - 2 sets - 30 sec hold - Corner Balance Feet Apart: Eyes Open With Head Turns  - 1 x daily - 5 x weekly - 2 sets - 30 sec hold - Romberg Stance with Head Nods  - 1 x daily - 5 x weekly - 2 sets - 30 sec hold - Stride Stance Weight Shift  - 1-2 x daily - 7 x weekly - 1-2 sets - 10 reps - Standing Gastroc Stretch on Step  - 1 x daily - 7 x weekly - 1 sets - 3 reps - 15-30 sec hold - Seated  Figure 4 Piriformis Stretch  - 1 x daily - 7 x weekly - 1 sets - 3 reps - 15 sec hold       Note: Objective measures were completed at Evaluation unless otherwise noted.  DIAGNOSTIC FINDINGS: NA for this episode  COGNITION: Overall cognitive status: Within functional limits for tasks assessed   SENSATION: Light touch: WFL Sometimes reports pressure/heaviness in feet  MUSCLE TONE: WFL BLEs   POSTURE: rounded shoulders and forward head  LOWER EXTREMITY ROM:   ACTIVE ROM WFL  LOWER EXTREMITY MMT:    MMT Right Eval Left Eval  Hip flexion 3+ 3+  Hip extension    Hip abduction 4 4  Hip adduction 4 4  Hip internal rotation    Hip external rotation    Knee flexion 3+ 4  Knee extension 4 4  Ankle dorsiflexion 3+ 3+  Ankle plantarflexion    Ankle inversion    Ankle eversion    (Blank rows = not tested)    TRANSFERS: Assistive device utilized: Environmental consultant - 4 wheeled  Sit to stand: Modified independence Stand to sit: Modified independence   GAIT: Gait pattern: step through pattern, decreased step length- Right, decreased stride length, and wide BOS Distance walked: 50 ft x 2 Assistive device utilized: Single point  cane and Walker - 4 wheeled Level of assistance: SBA and CGA Comments: Pt brought in her cane (round tip base with SPC)  FUNCTIONAL TESTS:  5 times sit to stand: 13.63 sec arms crossed at chest Timed up and go (TUG): 17.37 with rollator; 20.91 sec with cane  10 meter walk test: 18.97 sec with rollator (1.73 ft/sec); 27.19 sec with cane (1.21 f/tsec) Berg Balance Scale: 32/56  [facility] PT Assessment - 02/03/24 0001       Dynamic Gait Index   Level Surface Mild Impairment    Change in Gait Speed Mild Impairment    Gait with Horizontal Head Turns Mild Impairment    Gait with Vertical Head Turns Mild Impairment    Gait and Pivot Turn Mild Impairment    Step Over Obstacle Mild Impairment    Step Around Obstacles Mild Impairment    Steps Mild Impairment    Total Score 16      Timed Up and Go Test   Normal TUG (seconds) 14   w/out cane                                                                                                                                               GOALS: Goals reviewed with patient? Yes  SHORT TERM GOALS: Target date: 03/02/2024      Pt will be independent with HEP for improved strength, balance, gait. Baseline: reports some days better than others Goal status: MET 09/22/23  2.  Pt will improve 5x sit<>stand to less than or equal to 12 sec to  demonstrate improved functional strength and transfer efficiency. Baseline:  13.63 sec; 14.49 sec without UE support 09/22/23 Goal status: NOT MET 09/22/23  3.  Pt will improve Berg score to at least 38/56 to decrease fall risk. Baseline: 32/56; 49/56 09/22/23 Goal status: MET 09/22/23  4.  Pt will improve gait velocity to at least 2 ft/sec with rollator for improved gait efficiency and safety. Baseline: 1.72 ft/sec; 2.1 ft/sec 09/22/23 Goal status: MET 09/22/23  5.  Demo ability to pick up items from ground without UE support to improve safety in home  Baseline: UE support; no support  Goal status:  MET  6.  Report ability to ambulate household distances 50% of the time without AD  Baseline: 10%  Goal status: IN PROGRESS  LONG TERM GOALS: Target date: 10/10/2023>UPDATED TARGET 11/21/2023>UPDATED TARGET 03/30/2024      Pt will be independent with progression of HEP for improved strength, balance, gait. Baseline: continues to be updated Goal status: IN PROGRESS  11/20/2023  2.  Pt will improve TUG score to less than or equal to 13.5 sec for decreased fall risk. Baseline: 15.9 sec cane 11/20/2023; 14 sec w/ cane; 15 sec w/ cane, 14 sec w/out cane Goal status: IN PROGRESS 02/03/24  3.  Pt will improve Berg score to at least 45/56 to decrease fall risk. Baseline: 32/56; 49/56 09/22/23 Goal status: MET 09/22/23   5.  Pt will verbalize continued community fitness upon d/c from PT. Baseline: have provided education, endorse at home Youtube videos Goal status: IN PROGRESS 02/03/24  6. Pt will improve DGI score to at least 18/24 to decrease fall risk.  Baseline:  11/24>15/24; 16/24; 16/24  Goal status:  IN PROGRESS 02/03/24  7.  Pt will negotiate single curb step with cane and modified independent for improved community mobility.  Baseline:  CGA 11/20/2023; Supervision w/ cane; modified indep  Goal status:  Goal met  8.  Pt will improve gait velocity to at least 2.3 ft/sec for improved gait efficiency and safety.  Baseline:  2.07 ft/sec; 1.7 ft/sec w/ cane; 1.8 ft/sec w/ cane  Goal status:  IN PROGRESS, 02/03/24    9.  Pt will perform Condition 4 on MCTSIB 30 sec with mod sway or better for improved balance.    Baseline:  19 sec; moderate x 9 sec; 30 sec mild-mod    Goal status:  MET     10.     Baseline: TBD    Goal status: INITIAL  ASSESSMENT:  CLINICAL IMPRESSION: Performance of POC details demo improvement with TUG test without use of AD to 14 sec which would indicate high risk for falls. Dynamic Gait Index score 16/24 using cane throughout which indicates high risk for  falls. Improved postural control M-CTSIB condition 4 mild-moderate sway x 30 sec.  Gait speed remains largely unchanged using cane and 1.8 ft/sec.  Curb ambulation w/ modified independent using cane. Pt reports not performing HEP and provided education and resources for appropriate guided exercise classes on Youtube for improved home performance/compliance.  Pt would benefit from ongoing sessions due to high risk for falls and need for assistance to progress balance and ambulation to enable community-level ambulation status.    OBJECTIVE IMPAIRMENTS: Abnormal gait, decreased balance, decreased mobility, difficulty walking, and decreased strength.   ACTIVITY LIMITATIONS: bending, standing, squatting, transfers, bed mobility, and locomotion level  PARTICIPATION LIMITATIONS: driving, shopping, and community activity  PERSONAL FACTORS: 3+ comorbidities: see above are also affecting patient's functional outcome.   REHAB  POTENTIAL: Good  CLINICAL DECISION MAKING: Evolving/moderate complexity  EVALUATION COMPLEXITY: Moderate  PLAN:  PT FREQUENCY: 1x/week  PT DURATION: 8 weeks   PLANNED INTERVENTIONS: 97750- Physical Performance Testing, 97110-Therapeutic exercises, 97530- Therapeutic activity, [id number]- Neuromuscular re-education, 97535- Self Care, [id number]- Manual therapy, [phone]- Gait training, Patient/Family education, and Balance training  PLAN FOR NEXT SESSION: , gait w/out AD, squat lift items from floor, aerobics/breathing,   11:04 AM, 02/03/24 M. Kelly Aleah Ahlgrim, PT, DPT Physical Therapist- [place] Office Number: [phone]

## 2024-02-04 ENCOUNTER — Other Ambulatory Visit: Payer: Self-pay

## 2024-02-06 NOTE — Progress Notes (Signed)
 [facility] OFFICE PROGRESS NOTE  Stamey, Hayley POUR, FNP [address] [place] [address]  DIAGNOSIS: Stage IV (T1b, N2, M1 C) non-small cell lung cancer, adenocarcinoma presented with right lower lobe lung nodule in addition to small bilateral pulmonary nodules and right hilar and mediastinal lymphadenopathy in addition to liver and bone metastasis diagnosed in April 2024. 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   PDL1: 20%   Guardant 360: no actionable mutations.  PRIOR THERAPY: 1) Decompressive thoracic laminectomy of the T7 with spinal cord decompression and biopsy of the epidural mass in April 2024.  2) Palliative radiation to the bone under the care of Dr. Izell. Last day scheduled for 10/25/22   CURRENT THERAPY: Carboplatin  for an AUC of 5, Alimta  500 mg/m, Keytruda  200 mg IV every 3 weeks.  First dose expected on 10/28/2022. Status post 20 cycles.  Starting from cycle #5 the patient is on maintenance treatment with Alimta  and Keytruda  every 3 weeks.  Starting from cycle #7 the patient will be treated with single agent Alimta  500 Mg/M2 every 3 weeks.  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   INTERVAL HISTORY: Hayley Wu 73 y.o. female returns to the clinic today for a follow-up visit. She was last seen by Dr. Sherrod 3 weeks ago. She is currently on single agent Alimta . Keytruda  was discontinued due to suspicious immunotherapy mediated pneumonitis after a hospitalization in September 2024.   In the interval since last being seen, she denies any major changes in her health. She denies fever, chills, or night sweats.   Her appetite remains low, described as 'blah', and she is on Remeron  but has not noticed significant improvement. She attempts to maintain nutrition with protein shakes and bars, although she does  not feel hungry often. Her weight is stable.  Her thyroid  function was checked recently, and she is on a 150 mcg dose of thyroid  medication. She notes her hair is starting to thicken as her thyroid  levels stabilize.  She experiences shortness of breath, which she describes as 'fine' and consistent with previous visits. She continues physical therapy once a week and is progressing to walking with a cane, able to walk approximately twenty feet without it. She uses additional support when walking longer distances in the clinic.  Her last scan showed a large pericardial effusion.  She is not very symptomatic and therefore was not interested in a thoracentesis.  She has never required a thoracentesis before.  No recent issues with nausea, vomiting, or other gastrointestinal symptoms. No recent fevers, chills, night sweats, nausea, vomiting, diarrhea, constipation, headaches, vision changes, or unusual abdominal pain. Appetite is low. Shortness of breath is stable. No major swellings, only stable ankle swelling.   She is here for evaluation and repeat blood work before undergoing cycle #21.   MEDICAL HISTORY: Past Medical History:  Diagnosis Date   Allergy    Contact lens/glasses fitting    Hypertension     ALLERGIES:  is allergic to erythromycin, amoxicillin, latex, and prednisone .  MEDICATIONS:  Current Outpatient Medications  Medication Sig Dispense Refill   acetaminophen  (TYLENOL ) 500 MG tablet Take 500 mg by mouth every 6 (six) hours as needed for moderate pain.     amLODipine  (NORVASC ) 10 MG tablet Take 10 mg by mouth daily.     apixaban  (ELIQUIS ) 5 MG TABS tablet Take 1  tablet (5 mg total) by mouth 2 (two) times daily. 60 tablet 2   folic acid  (FOLVITE ) 1 MG tablet Take 1 tablet by mouth once daily 90 tablet 0   Ipratropium-Albuterol  (COMBIVENT ) 20-100 MCG/ACT AERS respimat Inhale 1 puff into the lungs every 6 (six) hours as needed for wheezing or shortness of breath. 4 g 1    levothyroxine  (SYNTHROID ) 112 MCG tablet Take 112 mcg by mouth daily before breakfast.     levothyroxine  (SYNTHROID ) 150 MCG tablet Take 150 mcg by mouth daily before breakfast.     lidocaine -prilocaine  (EMLA ) cream Apply 1 Application topically as needed. 30 g 2   mirtazapine  (REMERON ) 30 MG tablet TAKE 1 TABLET BY MOUTH AT BEDTIME 90 tablet 0   potassium chloride  SA (KLOR-CON  M) 20 MEQ tablet Take 1 tablet (20 mEq total) by mouth daily. (Patient not taking: Reported on 01/01/2024) 6 tablet 0   prochlorperazine  (COMPAZINE ) 10 MG tablet Take 1 tablet (10 mg total) by mouth every 6 (six) hours as needed. 30 tablet 2   VITAMIN D PO Take 1 tablet by mouth daily.     No current facility-administered medications for this visit.    SURGICAL HISTORY:  Past Surgical History:  Procedure Laterality Date   ABDOMINAL HYSTERECTOMY     BREAST SURGERY  2010   rt lumpectomy-neg   IR IMAGING GUIDED PORT INSERTION  12/18/2022   LAMINECTOMY N/A 09/21/2022   Procedure: THORACIC LAMINECTOMY FOR TUMOR;  Surgeon: Joshua Alm RAMAN, MD;  Location: [facility] OR;  Service: Neurosurgery;  Laterality: N/A;   OPEN REDUCTION INTERNAL FIXATION (ORIF) DISTAL RADIAL FRACTURE Left 11/26/2013   Procedure: OPEN REDUCTION INTERNAL FIXATION (ORIF) LEFT  DISTAL RADIUS ;  Surgeon: Dempsey JINNY Sensor, MD;  Location: [facility];  Service: Orthopedics;  Laterality: Left;  with block   TUBAL LIGATION      REVIEW OF SYSTEMS:   Review of Systems  Constitutional: Positive for fatigue and stable diminished appetite. Negative for chills, fever and unexpected weight change.  HENT: Positive for occasional nasal congestion. Negative for mouth sores, nosebleeds, sore throat and trouble swallowing.   Eyes: Negative for eye problems and icterus.  Respiratory: Positive for stable dyspnea on exertion. Negative for cough, hemoptysis, and wheezing.     Cardiovascular: Negative for chest pain and swelling.  Gastrointestinal: Negative for abdominal  pain, nausea, constipation, diarrhea, and vomiting.  Genitourinary: Negative for bladder incontinence, difficulty urinating, dysuria, frequency and hematuria.   Musculoskeletal: Negative for back pain, gait problem, neck pain and neck stiffness.  Skin: Negative for itching and rash.  Neurological: Negative for dizziness, extremity weakness, gait problem, headaches, light-headedness and seizures.  Hematological: Negative for adenopathy. Does not bruise/bleed easily.  Psychiatric/Behavioral: Negative for confusion, depression and sleep disturbance. The patient is not nervous/anxious.        PHYSICAL EXAMINATION:  Blood pressure (!) 134/94, pulse (!) 109, temperature 98.1 F (36.7 C), temperature source Temporal, resp. rate 17, weight 161 lb (73 kg), SpO2 95%.  ECOG PERFORMANCE STATUS: 1  Physical Exam  Constitutional: Oriented to person, place, and time and well-developed, well-nourished, and in no distress. HENT:  Head: Normocephalic and atraumatic.  Mouth/Throat: Oropharynx is clear and moist. No oropharyngeal exudate.  Eyes: Conjunctivae are normal. Right eye exhibits no discharge. Left eye exhibits no discharge. No scleral icterus.  Neck: Normal range of motion. Neck supple.  Cardiovascular: Normal rate, regular rhythm, normal heart sounds and intact distal pulses.   Pulmonary/Chest: Effort normal. Decreased breath sounds in  right lung. No respiratory distress. No wheezes. No rales.  Abdominal: Soft. Bowel sounds are normal. Exhibits no distension and no mass. There is no tenderness.  Musculoskeletal: Normal range of motion.  Lymphadenopathy:    No cervical adenopathy.  Neurological: Alert and oriented to person, place, and time. Exhibits muscle wasting. Ambulates with a walker.  Skin: Skin is warm and dry. No rash noted. Not diaphoretic. No erythema. No pallor.  Psychiatric: Mood, memory and judgment normal.  Vitals reviewed.  LABORATORY DATA: Lab Results  Component Value Date    WBC 5.1 02/12/2024   HGB 13.2 02/12/2024   HCT 40.2 02/12/2024   MCV 88.0 02/12/2024   PLT 234 02/12/2024      Chemistry      Component Value Date/Time   NA 141 02/12/2024 1324   K 3.5 02/12/2024 1324   CL 105 02/12/2024 1324   CO2 28 02/12/2024 1324   BUN 9 02/12/2024 1324   CREATININE 0.61 02/12/2024 1324      Component Value Date/Time   CALCIUM 9.2 02/12/2024 1324   ALKPHOS 84 02/12/2024 1324   AST 18 02/12/2024 1324   ALT 11 02/12/2024 1324   BILITOT 0.4 02/12/2024 1324       RADIOGRAPHIC STUDIES:  CT CHEST ABDOMEN PELVIS W CONTRAST Result Date: 01/21/2024 CLINICAL DATA:  Metastatic non-small cell lung cancer restaging * Tracking Code: BO * EXAM: CT CHEST, ABDOMEN, AND PELVIS WITH CONTRAST TECHNIQUE: Multidetector CT imaging of the chest, abdomen and pelvis was performed following the standard protocol during bolus administration of intravenous contrast. RADIATION DOSE REDUCTION: This exam was performed according to the departmental dose-optimization program which includes automated exposure control, adjustment of the mA and/or kV according to patient size and/or use of iterative reconstruction technique. CONTRAST:  OMNIPAQUE  IOHEXOL  300 MG/ML  SOLN COMPARISON:  11/13/2023 FINDINGS: CT CHEST FINDINGS Cardiovascular: Right chest port catheter. Aortic atherosclerosis. Normal heart size. Left and right coronary artery calcifications. Unchanged, moderate pericardial effusion. Mediastinum/Nodes: No enlarged mediastinal, hilar, or axillary lymph nodes. Thyroid  gland, trachea, and esophagus demonstrate no significant findings. Lungs/Pleura: Interval increase in volume of a right pleural effusion, now large with associated atelectasis or consolidation. Moderate centrilobular and paraseptal emphysema. Unchanged bandlike paramedian scarring and volume loss. Unchanged spiculated nodule of the right upper lobe measuring 0.7 cm (series 7, image 31). Unchanged spiculated nodule of the  right lower lobe measuring 1.4 x 1.1 cm (series 7, image 76). Musculoskeletal: No chest wall abnormality. No acute osseous findings. CT ABDOMEN PELVIS FINDINGS Hepatobiliary: No solid liver abnormality is seen. Multiple liver cysts unchanged. Gallstone. Unchanged intra and extrahepatic biliary ductal dilatation, common hepatic duct again measuring up to 1.4 cm in caliber (series 2, image 54). Pancreas: Unremarkable. No pancreatic ductal dilatation or surrounding inflammatory changes. Spleen: Normal in size without significant abnormality. Adrenals/Urinary Tract: Adrenal glands are unremarkable. Kidneys are normal, without renal calculi, solid lesion, or hydronephrosis. Bladder is unremarkable. Stomach/Bowel: Stomach is within normal limits. Appendix appears normal. No evidence of bowel wall thickening, distention, or inflammatory changes. Sigmoid diverticulosis. Vascular/Lymphatic: Aortic atherosclerosis. No enlarged abdominal or pelvic lymph nodes. Reproductive: Hysterectomy. Other: No abdominal wall hernia or abnormality. No ascites. Musculoskeletal: No acute osseous findings. Unchanged sclerotic osseous metastatic disease of the vertebral bodies, notable for a high-grade vertebral plana deformity of T7 and associated focal kyphosis (series 6, image 118). IMPRESSION: 1. Interval increase in volume of a right pleural effusion, now large with associated atelectasis or consolidation. 2. Unchanged spiculated nodules of the right upper  and lower lobes. 3. Unchanged sclerotic osseous metastatic disease of the vertebral bodies, notable for a high-grade vertebral plana deformity of T7 and associated focal kyphosis. 4. No evidence of lymphadenopathy or soft tissue metastatic disease in the abdomen or pelvis. 5. Unchanged moderate pericardial effusion. 6. Cholelithiasis with unchanged biliary ductal dilatation. 7. Emphysema. 8. Coronary artery disease. Aortic Atherosclerosis (ICD10-I70.0) and Emphysema (ICD10-J43.9).  Electronically Signed   By: Marolyn JONETTA Jaksch M.D.   On: 01/21/2024 21:28     ASSESSMENT/PLAN:  This is a very pleasant 73 year old Caucasian female recently diagnosed with stage IV (T1b, N2, M1 C) non-small cell lung cancer, adenocarcinoma presented with right lower lobe lung nodule in addition to small bilateral pulmonary nodules and right hilar and mediastinal lymphadenopathy in addition to liver and bone metastasis diagnosed in April 2024. 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 Her PDL1 expression is 20%. She has no actionable mutations.    She is completed palliative radiation to the painful metastatic bone lesions under the care of Dr. Izell in the last day radiation was on 10/25/2022.    She is currently undergoing systemic chemotherapy with carboplatin  for an AUC of 5, Alimta  500 mg/m, and Keytruda  200 mg IV every 3 weeks. She is status post 20 cycles.  Starting from cycle #5 she was on maintenance treatment with Alimta  and Keytruda .  Starting after cycle #6 Keytruda  was discontinued from the care plan due to suspicious immunotherapy mediated pneumonitis.  Therefore, she is currently undergoing single agent chemotherapy with Alimta  IV every 3 weeks.   Labs were reviewed. Recommend she proceed with cycle #21 today as scheduled. She is ok to treat with her pulse of 109 bmp.   We will see her back for follow-up visit in 3 weeks for evaluation and repeat blood work before undergoing cycle #22.    I refilled her folic acid . She was instructed to continue taking this.    Her TSH and T4 are pending for today.    Encourage use of protein drinks and small frequent meals.  We talked about the pleural effusion.  She denies any unusual symptoms at this time.  We will keep monitoring but she is advised to please contact us  if she feels like she has any worsening in her breathing as I would recommend a  thoracentesis at that time.  I did discuss risks and benefits of thoracentesis.  She will continue with PT once a week.    The patient was advised to call immediately if she has any concerning symptoms in the interval. The patient voices understanding of current disease status and treatment options and is in agreement with the current care plan. All questions were answered. The patient knows to call the clinic with any problems, questions or concerns. We can certainly see the patient much sooner if necessary    No orders of the defined types were placed in this encounter.   The total time spent in the appointment was 20-29 minutes  Vianca Bracher L Dynasia Kercheval, PA-C 02/12/24

## 2024-02-07 ENCOUNTER — Other Ambulatory Visit: Payer: Self-pay | Admitting: Physician Assistant

## 2024-02-07 DIAGNOSIS — C3491 Malignant neoplasm of unspecified part of right bronchus or lung: Secondary | ICD-10-CM

## 2024-02-09 ENCOUNTER — Encounter: Payer: Self-pay | Admitting: Internal Medicine

## 2024-02-10 ENCOUNTER — Encounter: Payer: Self-pay | Admitting: Physical Therapy

## 2024-02-10 ENCOUNTER — Ambulatory Visit: Attending: Family Medicine | Admitting: Physical Therapy

## 2024-02-10 DIAGNOSIS — R2689 Other abnormalities of gait and mobility: Secondary | ICD-10-CM | POA: Diagnosis not present

## 2024-02-10 DIAGNOSIS — M6281 Muscle weakness (generalized): Secondary | ICD-10-CM | POA: Diagnosis not present

## 2024-02-10 DIAGNOSIS — R2681 Unsteadiness on feet: Secondary | ICD-10-CM | POA: Diagnosis not present

## 2024-02-10 DIAGNOSIS — R262 Difficulty in walking, not elsewhere classified: Secondary | ICD-10-CM | POA: Diagnosis not present

## 2024-02-10 DIAGNOSIS — R29898 Other symptoms and signs involving the musculoskeletal system: Secondary | ICD-10-CM | POA: Diagnosis not present

## 2024-02-10 NOTE — Therapy (Signed)
 OUTPATIENT PHYSICAL THERAPY NEURO TREATMENT   Patient Name: Hayley Wu MRN: [id number] DOB:27-Jan-1951, 73 y.o., female Today's Date: 02/10/2024  PCP: Lanis Thresa JAYSON DEVONNA REFERRING PROVIDER: Crecencio [name], FNP     END OF SESSION:  PT End of Session - 02/10/24 1016     Visit Number 35    Number of Visits 42    Date for PT Re-Evaluation 03/30/24    Authorization Type [employer]    Progress Note Due on Visit 44    PT Start Time 1016    PT Stop Time 1055    PT Time Calculation (min) 39 min    Equipment Utilized During Treatment Gait belt    Activity Tolerance Patient tolerated treatment well    Behavior During Therapy WFL for tasks assessed/performed           Past Medical History:  Diagnosis Date   Allergy    Contact lens/glasses fitting    Hypertension    Past Surgical History:  Procedure Laterality Date   ABDOMINAL HYSTERECTOMY     BREAST SURGERY  2010   rt lumpectomy-neg   IR IMAGING GUIDED PORT INSERTION  12/18/2022   LAMINECTOMY N/A 09/21/2022   Procedure: THORACIC LAMINECTOMY FOR TUMOR;  Surgeon: Joshua Alm RAMAN, MD;  Location: [facility] OR;  Service: Neurosurgery;  Laterality: N/A;   OPEN REDUCTION INTERNAL FIXATION (ORIF) DISTAL RADIAL FRACTURE Left 11/26/2013   Procedure: OPEN REDUCTION INTERNAL FIXATION (ORIF) LEFT  DISTAL RADIUS ;  Surgeon: Dempsey JINNY Sensor, MD;  Location: [facility];  Service: Orthopedics;  Laterality: Left;  with block   TUBAL LIGATION     Patient Active Problem List   Diagnosis Date Noted   Acute deep vein thrombosis (DVT) of femoral vein of both lower extremities (HCC) 03/19/2023   Normocytic anemia 03/01/2023   Moderate protein malnutrition (HCC) 03/01/2023   Acute respiratory failure with hypoxia (HCC) 03/01/2023   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   Cardiomegaly 03/01/2023   Pleural effusion due to CHF (congestive heart failure) (HCC) 03/01/2023   Port-A-Cath in place 12/30/2022    Encounter for antineoplastic immunotherapy 11/18/2022   Encounter for antineoplastic chemotherapy 11/18/2022   Constipation 11/05/2022   Goals of care, counseling/discussion 10/21/2022   Cancer, metastatic to bone (HCC) 10/04/2022   Adenocarcinoma of right lung, stage 4 (HCC) 10/01/2022   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 09/20/2022   Hypokalemia 09/20/2022   Lung mass 09/20/2022   Hypothyroidism 09/20/2022   Cholelithiasis 09/20/2022   Lumbar spondylosis 08/26/2022   Thoracic back pain 04/04/2022   Low back pain 04/04/2022   Hypertension 09/23/2011    ONSET DATE: 08/08/2023 (MD order)  REFERRING DIAG:  Z91.81 (ICD-10-CM) - History of falling  C34.91 (ICD-10-CM) - Malignant neoplasm of unspecified part of right bronchus or lung    THERAPY DIAG:  Other abnormalities of gait and mobility  Unsteadiness on feet  Muscle weakness (generalized)  Other symptoms and signs involving the musculoskeletal system  Difficulty in walking, not elsewhere classified  Rationale for Evaluation and Treatment: Rehabilitation  SUBJECTIVE:  SUBJECTIVE STATEMENT: Pt reports feeling a little low energy today. Endorses no pain.   Pt accompanied by: self  PERTINENT HISTORY: Stage IV (T1b, N2, M1 C) non-small cell lung cancer, adenocarcinoma presented with right lower lobe lung nodule in addition to small bilateral pulmonary nodules and right hilar and mediastinal lymphadenopathy in addition to liver and bone metastasis diagnosed in April 2024. She is status post decompressive thoracic laminectomy of the T7 with spinal cord decompression and biopsy of the epidural mass in  April 2024.    PAIN:  Are you having pain? Yes: NPRS scale: no/10 Pain location: low back Pain description: sore Aggravating factors: bending, sitting wrong  Relieving factors: tylenol    PRECAUTIONS: Fall and Other: currently in treatment for lung cancer  RED FLAGS: None   WEIGHT BEARING RESTRICTIONS: No  FALLS: Has patient fallen in last 6 months? No  LIVING ENVIRONMENT: Lives with: lives alone Lives in: House/apartment Stairs: 1 step to get into home Has following equipment at home: Single point cane, Environmental consultant - 4 wheeled, Wheelchair (manual), and shower chair  PLOF: Independent with household mobility with device and Independent with community mobility with device  PATIENT GOALS: Still need to work on balance, endurance, and would like to work on cane more.  OBJECTIVE:  TODAY'S TREATMENT: 02/10/24 Activity Comments  Nustep L4 - L6 x 10 min  Intermittently increased/decreased resistance  Modified deadlift 3# x10 Modified deadlift 8# x10   Alternating stair taps 6 2x60 4# ankle weights, 1 UE support  Side step tap 6 2x60 4# ankle weight, 1 UE support SpO2 88%, HR 120s after but recovered to 93% with seated break  Standing hip ext 4# ankle weight 2x10   Standing hamstring curl 4# ankle weight 2x10   Standing step over 2 pool noodles x 5 laps in // bars spO2 88%, 120 BPM; recovered to 94% and 116 BPM with seated break         PATIENT EDUCATION: Education details: HEP updates/modifications Person educated: Patient Education method: Explanation Education comprehension: verbalized understanding     HOME EXERCISE PROGRAM: Access Code: [email or web address] URL: [email or web address] Date: 11/18/2023 Prepared by: [facility] - Outpatient  Rehab - [facility]  Program Notes practice walking inside the house with your cane for 5 minutes per day.   Exercises - Seated Hamstring Curls with Resistance  - 1 x daily - 5 x weekly - 3 sets - 10 reps - Seated  Knee Extension with Resistance  - 1 x daily - 5 x weekly - 3 sets - 10 reps - Seated Hip Abduction with Resistance  - 1 x daily - 5 x weekly - 2 sets - 10 reps - Side Stepping with Counter Support  - 1 x daily - 5 x weekly - 2-3 sets - 10 reps - Alternating Step Taps with Counter Support  - 1 x daily - 5 x weekly - 3 sets - 10 reps - Backward Walking with Counter Support  - 1 x daily - 5 x weekly - 1 sets - 5 reps - Squat with Chair Touch  - 1 x daily - 5 x weekly - 2 sets - 10 reps - Corner Balance Feet Together With Eyes Open  - 1 x daily - 5 x weekly - 2 sets - 30 sec hold - Corner Balance Feet Apart: Eyes Open With Head Turns  - 1 x daily - 5 x weekly - 2 sets - 30 sec hold - Romberg Stance with Head Nods  -  1 x daily - 5 x weekly - 2 sets - 30 sec hold - Stride Stance Weight Shift  - 1-2 x daily - 7 x weekly - 1-2 sets - 10 reps - Standing Gastroc Stretch on Step  - 1 x daily - 7 x weekly - 1 sets - 3 reps - 15-30 sec hold - Seated Figure 4 Piriformis Stretch  - 1 x daily - 7 x weekly - 1 sets - 3 reps - 15 sec hold       Note: Objective measures were completed at Evaluation unless otherwise noted.  DIAGNOSTIC FINDINGS: NA for this episode  COGNITION: Overall cognitive status: Within functional limits for tasks assessed   SENSATION: Light touch: WFL Sometimes reports pressure/heaviness in feet  MUSCLE TONE: WFL BLEs   POSTURE: rounded shoulders and forward head  LOWER EXTREMITY ROM:   ACTIVE ROM WFL  LOWER EXTREMITY MMT:    MMT Right Eval Left Eval  Hip flexion 3+ 3+  Hip extension    Hip abduction 4 4  Hip adduction 4 4  Hip internal rotation    Hip external rotation    Knee flexion 3+ 4  Knee extension 4 4  Ankle dorsiflexion 3+ 3+  Ankle plantarflexion    Ankle inversion    Ankle eversion    (Blank rows = not tested)    TRANSFERS: Assistive device utilized: Environmental consultant - 4 wheeled  Sit to stand: Modified independence Stand to sit: Modified  independence   GAIT: Gait pattern: step through pattern, decreased step length- Right, decreased stride length, and wide BOS Distance walked: 50 ft x 2 Assistive device utilized: Single point cane and Walker - 4 wheeled Level of assistance: SBA and CGA Comments: Pt brought in her cane (round tip base with SPC)  FUNCTIONAL TESTS:  5 times sit to stand: 13.63 sec arms crossed at chest Timed up and go (TUG): 17.37 with rollator; 20.91 sec with cane  10 meter walk test: 18.97 sec with rollator (1.73 ft/sec); 27.19 sec with cane (1.21 f/tsec) Lars Balance Scale: 32/56                                                                                                                                          GOALS: Goals reviewed with patient? Yes  SHORT TERM GOALS: Target date: 03/02/2024      Pt will be independent with HEP for improved strength, balance, gait. Baseline: reports some days better than others Goal status: MET 09/22/23  2.  Pt will improve 5x sit<>stand to less than or equal to 12 sec to demonstrate improved functional strength and transfer efficiency. Baseline:  13.63 sec; 14.49 sec without UE support 09/22/23 Goal status: NOT MET 09/22/23  3.  Pt will improve Berg score to at least 38/56 to decrease fall risk. Baseline: 32/56; 49/56 09/22/23 Goal status: MET 09/22/23  4.  Pt will improve  gait velocity to at least 2 ft/sec with rollator for improved gait efficiency and safety. Baseline: 1.72 ft/sec; 2.1 ft/sec 09/22/23 Goal status: MET 09/22/23  5.  Demo ability to pick up items from ground without UE support to improve safety in home  Baseline: UE support; no support  Goal status: MET  6.  Report ability to ambulate household distances 50% of the time without AD  Baseline: 10%  Goal status: IN PROGRESS  LONG TERM GOALS: Target date: 10/10/2023>UPDATED TARGET 11/21/2023>UPDATED TARGET 03/30/2024      Pt will be independent with progression of HEP for  improved strength, balance, gait. Baseline: continues to be updated Goal status: IN PROGRESS  11/20/2023  2.  Pt will improve TUG score to less than or equal to 13.5 sec for decreased fall risk. Baseline: 15.9 sec cane 11/20/2023; 14 sec w/ cane; 15 sec w/ cane, 14 sec w/out cane Goal status: IN PROGRESS 02/03/24  3.  Pt will improve Berg score to at least 45/56 to decrease fall risk. Baseline: 32/56; 49/56 09/22/23 Goal status: MET 09/22/23   5.  Pt will verbalize continued community fitness upon d/c from PT. Baseline: have provided education, endorse at home Youtube videos Goal status: IN PROGRESS 02/03/24  6. Pt will improve DGI score to at least 18/24 to decrease fall risk.  Baseline:  11/24>15/24; 16/24; 16/24  Goal status:  IN PROGRESS 02/03/24  7.  Pt will negotiate single curb step with cane and modified independent for improved community mobility.  Baseline:  CGA 11/20/2023; Supervision w/ cane; modified indep  Goal status:  Goal met  8.  Pt will improve gait velocity to at least 2.3 ft/sec for improved gait efficiency and safety.  Baseline:  2.07 ft/sec; 1.7 ft/sec w/ cane; 1.8 ft/sec w/ cane  Goal status:  IN PROGRESS, 02/03/24    9.  Pt will perform Condition 4 on MCTSIB 30 sec with mod sway or better for improved balance.    Baseline:  19 sec; moderate x 9 sec; 30 sec mild-mod    Goal status:  MET     10.     Baseline: TBD    Goal status: INITIAL  ASSESSMENT:  CLINICAL IMPRESSION: Pt not feeling like she has as much energy today. HR maintained mostly around 120s even at rest today. Deferred 6 MWT. Worked on strength and endurance. Will perform 6 MWT when vitals are more within her norm (states HR is typically >100 BPM but not normally in 120s). SpO2 was also dropping today and required more rest breaks to recover.   OBJECTIVE IMPAIRMENTS: Abnormal gait, decreased balance, decreased mobility, difficulty walking, and decreased strength.   ACTIVITY LIMITATIONS:  bending, standing, squatting, transfers, bed mobility, and locomotion level  PARTICIPATION LIMITATIONS: driving, shopping, and community activity  PERSONAL FACTORS: 3+ comorbidities: see above are also affecting patient's functional outcome.   REHAB POTENTIAL: Good  CLINICAL DECISION MAKING: Evolving/moderate complexity  EVALUATION COMPLEXITY: Moderate  PLAN:  PT FREQUENCY: 1x/week  PT DURATION: 8 weeks   PLANNED INTERVENTIONS: 97750- Physical Performance Testing, 97110-Therapeutic exercises, 97530- Therapeutic activity, 97112- Neuromuscular re-education, 97535- Self Care, [id number]- Manual therapy, [phone]- Gait training, Patient/Family education, and Balance training  PLAN FOR NEXT SESSION: , gait w/out AD, squat lift items from floor, aerobics/breathing,   10:16 AM, 02/10/24 Antar Milks April Ma L Meshell Abdulaziz, PT, DPT Physical Therapist- [place] Office Number: [phone]

## 2024-02-12 ENCOUNTER — Inpatient Hospital Stay: Attending: Internal Medicine

## 2024-02-12 ENCOUNTER — Inpatient Hospital Stay

## 2024-02-12 ENCOUNTER — Other Ambulatory Visit

## 2024-02-12 ENCOUNTER — Inpatient Hospital Stay: Attending: Internal Medicine | Admitting: Physician Assistant

## 2024-02-12 ENCOUNTER — Ambulatory Visit

## 2024-02-12 ENCOUNTER — Ambulatory Visit: Admitting: Internal Medicine

## 2024-02-12 VITALS — BP 134/94 | HR 109 | Temp 98.1°F | Resp 17 | Wt 161.0 lb

## 2024-02-12 DIAGNOSIS — Z5111 Encounter for antineoplastic chemotherapy: Secondary | ICD-10-CM

## 2024-02-12 DIAGNOSIS — C7951 Secondary malignant neoplasm of bone: Secondary | ICD-10-CM | POA: Diagnosis not present

## 2024-02-12 DIAGNOSIS — R634 Abnormal weight loss: Secondary | ICD-10-CM | POA: Diagnosis not present

## 2024-02-12 DIAGNOSIS — C787 Secondary malignant neoplasm of liver and intrahepatic bile duct: Secondary | ICD-10-CM | POA: Diagnosis not present

## 2024-02-12 DIAGNOSIS — J90 Pleural effusion, not elsewhere classified: Secondary | ICD-10-CM | POA: Diagnosis not present

## 2024-02-12 DIAGNOSIS — C3491 Malignant neoplasm of unspecified part of right bronchus or lung: Secondary | ICD-10-CM

## 2024-02-12 DIAGNOSIS — C3431 Malignant neoplasm of lower lobe, right bronchus or lung: Secondary | ICD-10-CM | POA: Insufficient documentation

## 2024-02-12 DIAGNOSIS — Z79899 Other long term (current) drug therapy: Secondary | ICD-10-CM | POA: Diagnosis not present

## 2024-02-12 LAB — CBC WITH DIFFERENTIAL (CANCER CENTER ONLY)
Abs Immature Granulocytes: 0.01 K/uL (ref 0.00–0.07)
Basophils Absolute: 0 K/uL (ref 0.0–0.1)
Basophils Relative: 1 %
Eosinophils Absolute: 0.1 K/uL (ref 0.0–0.5)
Eosinophils Relative: 1 %
HCT: 40.2 % (ref 36.0–46.0)
Hemoglobin: 13.2 g/dL (ref 12.0–15.0)
Immature Granulocytes: 0 %
Lymphocytes Relative: 12 %
Lymphs Abs: 0.6 K/uL — ABNORMAL LOW (ref 0.7–4.0)
MCH: 28.9 pg (ref 26.0–34.0)
MCHC: 32.8 g/dL (ref 30.0–36.0)
MCV: 88 fL (ref 80.0–100.0)
Monocytes Absolute: 0.4 K/uL (ref 0.1–1.0)
Monocytes Relative: 8 %
Neutro Abs: 4 K/uL (ref 1.7–7.7)
Neutrophils Relative %: 78 %
Platelet Count: 234 K/uL (ref 150–400)
RBC: 4.57 MIL/uL (ref 3.87–5.11)
RDW: 15.2 % (ref 11.5–15.5)
WBC Count: 5.1 K/uL (ref 4.0–10.5)
nRBC: 0 % (ref 0.0–0.2)

## 2024-02-12 LAB — CMP (CANCER CENTER ONLY)
ALT: 11 U/L (ref 0–44)
AST: 18 U/L (ref 15–41)
Albumin: 4.2 g/dL (ref 3.5–5.0)
Alkaline Phosphatase: 84 U/L (ref 38–126)
Anion gap: 8 (ref 5–15)
BUN: 9 mg/dL (ref 8–23)
CO2: 28 mmol/L (ref 22–32)
Calcium: 9.2 mg/dL (ref 8.9–10.3)
Chloride: 105 mmol/L (ref 98–111)
Creatinine: 0.61 mg/dL (ref 0.44–1.00)
GFR, Estimated: >60 mL/min (ref >60)
Glucose, Bld: 119 mg/dL — ABNORMAL HIGH (ref 70–99)
Potassium: 3.5 mmol/L (ref 3.5–5.1)
Sodium: 141 mmol/L (ref 135–145)
Total Bilirubin: 0.4 mg/dL (ref 0.0–1.2)
Total Protein: 6.9 g/dL (ref 6.5–8.1)

## 2024-02-12 LAB — TSH: TSH: 1.61 u[IU]/mL (ref 0.350–4.500)

## 2024-02-12 MED ORDER — FOLIC ACID 1 MG PO TABS: 1.0000 mg | ORAL_TABLET | Freq: Every day | ORAL | 0 refills | Status: AC

## 2024-02-12 MED ORDER — CYANOCOBALAMIN 1000 MCG/ML IJ SOLN
1000.0000 ug | Freq: Once | INTRAMUSCULAR | Status: AC
  Administered 2024-02-12: 1000 ug via INTRAMUSCULAR
  Filled 2024-02-12: qty 1

## 2024-02-12 MED ORDER — PROCHLORPERAZINE MALEATE 10 MG PO TABS
10.0000 mg | ORAL_TABLET | Freq: Once | ORAL | Status: AC
  Administered 2024-02-12: 10 mg via ORAL
  Filled 2024-02-12: qty 1

## 2024-02-12 MED ORDER — DEXAMETHASONE SODIUM PHOSPHATE 10 MG/ML IJ SOLN
10.0000 mg | Freq: Once | INTRAMUSCULAR | Status: AC
  Administered 2024-02-12: 10 mg via INTRAVENOUS
  Filled 2024-02-12: qty 1

## 2024-02-12 MED ORDER — SODIUM CHLORIDE 0.9 % IV SOLN: Freq: Once | INTRAVENOUS | Status: AC

## 2024-02-12 MED ORDER — SODIUM CHLORIDE 0.9 % IV SOLN
500.0000 mg/m2 | Freq: Once | INTRAVENOUS | Status: AC
  Administered 2024-02-12: 900 mg via INTRAVENOUS
  Filled 2024-02-12: qty 20

## 2024-02-12 NOTE — Patient Instructions (Signed)
 [facility] - A DEPT OF [facility]  Discharge Instructions: Thank you for choosing [facility] to provide your oncology and hematology care.   If you have a lab appointment with the Cancer Center, please go directly to the Cancer Center and check in at the registration area.   Wear comfortable clothing and clothing appropriate for easy access to any Portacath or PICC line.   We strive to give you quality time with your provider. You may need to reschedule your appointment if you arrive late (15 or more minutes).  Arriving late affects you and other patients whose appointments are after yours.  Also, if you miss three or more appointments without notifying the office, you may be dismissed from the clinic at the provider's discretion.      For prescription refill requests, have your pharmacy contact our office and allow 72 hours for refills to be completed.    Today you received the following chemotherapy and/or immunotherapy agents: pemetrexed      To help prevent nausea and vomiting after your treatment, we encourage you to take your nausea medication as directed.  BELOW ARE SYMPTOMS THAT SHOULD BE REPORTED IMMEDIATELY: *FEVER GREATER THAN 100.4 F (38 C) OR HIGHER *CHILLS OR SWEATING *NAUSEA AND VOMITING THAT IS NOT CONTROLLED WITH YOUR NAUSEA MEDICATION *UNUSUAL SHORTNESS OF BREATH *UNUSUAL BRUISING OR BLEEDING *URINARY PROBLEMS (pain or burning when urinating, or frequent urination) *BOWEL PROBLEMS (unusual diarrhea, constipation, pain near the anus) TENDERNESS IN MOUTH AND THROAT WITH OR WITHOUT PRESENCE OF ULCERS (sore throat, sores in mouth, or a toothache) UNUSUAL RASH, SWELLING OR PAIN  UNUSUAL VAGINAL DISCHARGE OR ITCHING   Items with * indicate a potential emergency and should be followed up as soon as possible or go to the Emergency Department if any problems should occur.  Please show the CHEMOTHERAPY ALERT CARD or IMMUNOTHERAPY  ALERT CARD at check-in to the Emergency Department and triage nurse.  Should you have questions after your visit or need to cancel or reschedule your appointment, please contact [facility] - A DEPT OF [facility]  Dept: [phone]  and follow the prompts.  Office hours are 8:00 a.m. to 4:30 p.m. Monday - Friday. Please note that voicemails left after 4:00 p.m. may not be returned until the following business day.  We are closed weekends and major holidays. You have access to a nurse at all times for urgent questions. Please call the main number to the clinic Dept: [phone] and follow the prompts.   For any non-urgent questions, you may also contact your provider using MyChart. We now offer e-Visits for anyone 30 and older to request care online for non-urgent symptoms. For details visit [email or web address].   Also download the MyChart app! Go to the app store, search "MyChart", open the app, select [place], and log in with your MyChart username and password.

## 2024-02-13 LAB — T4: T4, Total: 15.9 ug/dL — ABNORMAL HIGH (ref 4.5–12.0)

## 2024-02-17 ENCOUNTER — Ambulatory Visit: Admitting: Physical Therapy

## 2024-02-17 ENCOUNTER — Encounter: Payer: Self-pay | Admitting: Physical Therapy

## 2024-02-17 DIAGNOSIS — R29898 Other symptoms and signs involving the musculoskeletal system: Secondary | ICD-10-CM

## 2024-02-17 DIAGNOSIS — R2689 Other abnormalities of gait and mobility: Secondary | ICD-10-CM

## 2024-02-17 DIAGNOSIS — R2681 Unsteadiness on feet: Secondary | ICD-10-CM

## 2024-02-17 DIAGNOSIS — M6281 Muscle weakness (generalized): Secondary | ICD-10-CM

## 2024-02-17 DIAGNOSIS — R262 Difficulty in walking, not elsewhere classified: Secondary | ICD-10-CM

## 2024-02-17 NOTE — Therapy (Signed)
 OUTPATIENT PHYSICAL THERAPY NEURO TREATMENT   Patient Name: Hayley Wu MRN: [id number] DOB:Jul 12, 1950, 73 y.o., female Today's Date: 02/17/2024  PCP: Lanis Thresa JAYSON DEVONNA REFERRING PROVIDER: Crecencio [name], FNP     END OF SESSION:  PT End of Session - 02/17/24 1020     Visit Number 36    Number of Visits 42    Date for PT Re-Evaluation 03/30/24    Authorization Type [employer]    Progress Note Due on Visit 44    PT Start Time 1017    PT Stop Time 1055    PT Time Calculation (min) 38 min    Equipment Utilized During Treatment Gait belt    Activity Tolerance Patient tolerated treatment well    Behavior During Therapy WFL for tasks assessed/performed            Past Medical History:  Diagnosis Date   Allergy    Contact lens/glasses fitting    Hypertension    Past Surgical History:  Procedure Laterality Date   ABDOMINAL HYSTERECTOMY     BREAST SURGERY  2010   rt lumpectomy-neg   IR IMAGING GUIDED PORT INSERTION  12/18/2022   LAMINECTOMY N/A 09/21/2022   Procedure: THORACIC LAMINECTOMY FOR TUMOR;  Surgeon: Joshua Alm RAMAN, MD;  Location: [facility] OR;  Service: Neurosurgery;  Laterality: N/A;   OPEN REDUCTION INTERNAL FIXATION (ORIF) DISTAL RADIAL FRACTURE Left 11/26/2013   Procedure: OPEN REDUCTION INTERNAL FIXATION (ORIF) LEFT  DISTAL RADIUS ;  Surgeon: Dempsey JINNY Sensor, MD;  Location: [facility];  Service: Orthopedics;  Laterality: Left;  with block   TUBAL LIGATION     Patient Active Problem List   Diagnosis Date Noted   Acute deep vein thrombosis (DVT) of femoral vein of both lower extremities (HCC) 03/19/2023   Normocytic anemia 03/01/2023   Moderate protein malnutrition (HCC) 03/01/2023   Acute respiratory failure with hypoxia (HCC) 03/01/2023   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   Cardiomegaly 03/01/2023   Pleural effusion due to CHF (congestive heart failure) (HCC) 03/01/2023   Port-A-Cath in place 12/30/2022    Encounter for antineoplastic immunotherapy 11/18/2022   Encounter for antineoplastic chemotherapy 11/18/2022   Constipation 11/05/2022   Goals of care, counseling/discussion 10/21/2022   Cancer, metastatic to bone (HCC) 10/04/2022   Adenocarcinoma of right lung, stage 4 (HCC) 10/01/2022   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 09/20/2022   Hypokalemia 09/20/2022   Lung mass 09/20/2022   Hypothyroidism 09/20/2022   Cholelithiasis 09/20/2022   Lumbar spondylosis 08/26/2022   Thoracic back pain 04/04/2022   Low back pain 04/04/2022   Hypertension 09/23/2011    ONSET DATE: 08/08/2023 (MD order)  REFERRING DIAG:  Z91.81 (ICD-10-CM) - History of falling  C34.91 (ICD-10-CM) - Malignant neoplasm of unspecified part of right bronchus or lung    THERAPY DIAG:  Other abnormalities of gait and mobility  Unsteadiness on feet  Muscle weakness (generalized)  Other symptoms and signs involving the musculoskeletal system  Difficulty in walking, not elsewhere classified  Rationale for Evaluation and Treatment: Rehabilitation  SUBJECTIVE:  SUBJECTIVE STATEMENT: Pt notes feeling a little more energy today. States she had chemo on Thursday so still feeling the effects of that.   Pt accompanied by: self  PERTINENT HISTORY: Stage IV (T1b, N2, M1 C) non-small cell lung cancer, adenocarcinoma presented with right lower lobe lung nodule in addition to small bilateral pulmonary nodules and right hilar and mediastinal lymphadenopathy in addition to liver and bone metastasis diagnosed in April 2024. She is status post decompressive thoracic laminectomy of the T7 with spinal cord  decompression and biopsy of the epidural mass in April 2024.    PAIN:  Are you having pain? Yes: NPRS scale: no/10 Pain location: low back Pain description: sore Aggravating factors: bending, sitting wrong  Relieving factors: tylenol    PRECAUTIONS: Fall and Other: currently in treatment for lung cancer  RED FLAGS: None   WEIGHT BEARING RESTRICTIONS: No  FALLS: Has patient fallen in last 6 months? No  LIVING ENVIRONMENT: Lives with: lives alone Lives in: House/apartment Stairs: 1 step to get into home Has following equipment at home: Single point cane, Environmental consultant - 4 wheeled, Wheelchair (manual), and shower chair  PLOF: Independent with household mobility with device and Independent with community mobility with device  PATIENT GOALS: Still need to work on balance, endurance, and would like to work on cane more.  OBJECTIVE:  TODAY'S TREATMENT: 02/16/24 Activity Comments  Vitals sitting 0/10 RPE, 95% spO2, 115 BPM  6 min walk test 475'; 0/10 RPE, 91% spO2, 116 BPM 1 sitting break at around 5 min due to R lateral hip pain  Seated figure 4 stretch x 30 Seated piriformis stretch x 30   Standing hip abd 2x10 Standing hip ext 2x10 Standing clock reach 2x5   One foot on 6 surface 2x30 EO static balance, head turns and then head nods x10 each 4# ankle weights, bilat UE support  No UE support if able. First set with 4#, 2nd set with no weight due to increased HR to 130s  Fwd step over 4 hurdles with and then without cane Mild instability and decreased control without a/d                    PATIENT EDUCATION: Education details: HEP updates/modifications Person educated: Patient Education method: Explanation Education comprehension: verbalized understanding     HOME EXERCISE PROGRAM: Access Code: [email or web address] URL: [email or web address] Date: 11/18/2023 Prepared by: [facility]  Program Notes practice walking  inside the house with your cane for 5 minutes per day.   Exercises - Seated Hamstring Curls with Resistance  - 1 x daily - 5 x weekly - 3 sets - 10 reps - Seated Knee Extension with Resistance  - 1 x daily - 5 x weekly - 3 sets - 10 reps - Seated Hip Abduction with Resistance  - 1 x daily - 5 x weekly - 2 sets - 10 reps - Side Stepping with Counter Support  - 1 x daily - 5 x weekly - 2-3 sets - 10 reps - Alternating Step Taps with Counter Support  - 1 x daily - 5 x weekly - 3 sets - 10 reps - Backward Walking with Counter Support  - 1 x daily - 5 x weekly - 1 sets - 5 reps - Squat with Chair Touch  - 1 x daily - 5 x weekly - 2 sets - 10 reps - Corner Balance Feet Together With Eyes Open  - 1 x daily - 5  x weekly - 2 sets - 30 sec hold - Corner Balance Feet Apart: Eyes Open With Head Turns  - 1 x daily - 5 x weekly - 2 sets - 30 sec hold - Romberg Stance with Head Nods  - 1 x daily - 5 x weekly - 2 sets - 30 sec hold - Stride Stance Weight Shift  - 1-2 x daily - 7 x weekly - 1-2 sets - 10 reps - Standing Gastroc Stretch on Step  - 1 x daily - 7 x weekly - 1 sets - 3 reps - 15-30 sec hold - Seated Figure 4 Piriformis Stretch  - 1 x daily - 7 x weekly - 1 sets - 3 reps - 15 sec hold       Note: Objective measures were completed at Evaluation unless otherwise noted.  DIAGNOSTIC FINDINGS: NA for this episode  COGNITION: Overall cognitive status: Within functional limits for tasks assessed   SENSATION: Light touch: WFL Sometimes reports pressure/heaviness in feet  MUSCLE TONE: WFL BLEs   POSTURE: rounded shoulders and forward head  LOWER EXTREMITY ROM:   ACTIVE ROM WFL  LOWER EXTREMITY MMT:    MMT Right Eval Left Eval  Hip flexion 3+ 3+  Hip extension    Hip abduction 4 4  Hip adduction 4 4  Hip internal rotation    Hip external rotation    Knee flexion 3+ 4  Knee extension 4 4  Ankle dorsiflexion 3+ 3+  Ankle plantarflexion    Ankle inversion    Ankle eversion     (Blank rows = not tested)    TRANSFERS: Assistive device utilized: Environmental consultant - 4 wheeled  Sit to stand: Modified independence Stand to sit: Modified independence   GAIT: Gait pattern: step through pattern, decreased step length- Right, decreased stride length, and wide BOS Distance walked: 50 ft x 2 Assistive device utilized: Single point cane and Walker - 4 wheeled Level of assistance: SBA and CGA Comments: Pt brought in her cane (round tip base with SPC)  FUNCTIONAL TESTS:  5 times sit to stand: 13.63 sec arms crossed at chest Timed up and go (TUG): 17.37 with rollator; 20.91 sec with cane  10 meter walk test: 18.97 sec with rollator (1.73 ft/sec); 27.19 sec with cane (1.21 f/tsec) Lars Balance Scale: 32/56                                                                                                                                          GOALS: Goals reviewed with patient? Yes  SHORT TERM GOALS: Target date: 03/02/2024      Pt will be independent with HEP for improved strength, balance, gait. Baseline: reports some days better than others Goal status: MET 09/22/23  2.  Pt will improve 5x sit<>stand to less than or equal to 12 sec to demonstrate improved functional strength and transfer efficiency. Baseline:  13.63 sec; 14.49 sec without UE support 09/22/23 Goal status: NOT MET 09/22/23  3.  Pt will improve Berg score to at least 38/56 to decrease fall risk. Baseline: 32/56; 49/56 09/22/23 Goal status: MET 09/22/23  4.  Pt will improve gait velocity to at least 2 ft/sec with rollator for improved gait efficiency and safety. Baseline: 1.72 ft/sec; 2.1 ft/sec 09/22/23 Goal status: MET 09/22/23  5.  Demo ability to pick up items from ground without UE support to improve safety in home  Baseline: UE support; no support  Goal status: MET  6.  Report ability to ambulate household distances 50% of the time without AD  Baseline: 10%  Goal status: IN  PROGRESS  LONG TERM GOALS: Target date: 10/10/2023>UPDATED TARGET 11/21/2023>UPDATED TARGET 03/30/2024      Pt will be independent with progression of HEP for improved strength, balance, gait. Baseline: continues to be updated Goal status: IN PROGRESS  11/20/2023  2.  Pt will improve TUG score to less than or equal to 13.5 sec for decreased fall risk. Baseline: 15.9 sec cane 11/20/2023; 14 sec w/ cane; 15 sec w/ cane, 14 sec w/out cane Goal status: IN PROGRESS 02/03/24  3.  Pt will improve Berg score to at least 45/56 to decrease fall risk. Baseline: 32/56; 49/56 09/22/23 Goal status: MET 09/22/23   5.  Pt will verbalize continued community fitness upon d/c from PT. Baseline: have provided education, endorse at home Youtube videos Goal status: IN PROGRESS 02/03/24  6. Pt will improve DGI score to at least 18/24 to decrease fall risk.  Baseline:  11/24>15/24; 16/24; 16/24  Goal status:  IN PROGRESS 02/03/24  7.  Pt will negotiate single curb step with cane and modified independent for improved community mobility.  Baseline:  CGA 11/20/2023; Supervision w/ cane; modified indep  Goal status:  Goal met  8.  Pt will improve gait velocity to at least 2.3 ft/sec for improved gait efficiency and safety.  Baseline:  2.07 ft/sec; 1.7 ft/sec w/ cane; 1.8 ft/sec w/ cane  Goal status:  IN PROGRESS, 02/03/24    9.  Pt will perform Condition 4 on MCTSIB 30 sec with mod sway or better for improved balance.    Baseline:  19 sec; moderate x 9 sec; 30 sec mild-mod    Goal status:  MET  10. to improve to at least 645' to demo MCID    Baseline: 475' 02/17/24    Goal status: INITIAL  ASSESSMENT:  CLINICAL IMPRESSION: Able to perform 6 MWT today. Mostly limited due to R hip pain rather than SHOB. Continued to work on LE strength and stability in standing. Worked on improving dynamic balance with stepping over obstacles without a/d for support to work towards improving pt's DGI.   OBJECTIVE  IMPAIRMENTS: Abnormal gait, decreased balance, decreased mobility, difficulty walking, and decreased strength.   ACTIVITY LIMITATIONS: bending, standing, squatting, transfers, bed mobility, and locomotion level  PARTICIPATION LIMITATIONS: driving, shopping, and community activity  PERSONAL FACTORS: 3+ comorbidities: see above are also affecting patient's functional outcome.   REHAB POTENTIAL: Good  CLINICAL DECISION MAKING: Evolving/moderate complexity  EVALUATION COMPLEXITY: Moderate  PLAN:  PT FREQUENCY: 1x/week  PT DURATION: 8 weeks   PLANNED INTERVENTIONS: 97750- Physical Performance Testing, 97110-Therapeutic exercises, 97530- Therapeutic activity, [id number]- Neuromuscular re-education, 97535- Self Care, [id number]- Manual therapy, [phone]- Gait training, Patient/Family education, and Balance training  PLAN FOR NEXT SESSION:  gait w/out AD, squat lift items from floor, aerobics/breathing,   10:21 AM, 02/17/24 [place]  April Honor LITTIE Starring, PT, DPT Physical Therapist- [place] Office Number: [phone]

## 2024-02-20 ENCOUNTER — Other Ambulatory Visit: Payer: Self-pay | Admitting: Internal Medicine

## 2024-02-20 DIAGNOSIS — C3491 Malignant neoplasm of unspecified part of right bronchus or lung: Secondary | ICD-10-CM

## 2024-02-23 NOTE — Therapy (Signed)
 OUTPATIENT PHYSICAL THERAPY NEURO TREATMENT   Patient Name: Hayley Wu MRN: [id number] DOB:September 11, 1950, 73 y.o., female Today's Date: 02/24/2024  PCP: Lanis Thresa JAYSON DEVONNA REFERRING PROVIDER: Crecencio [name], FNP     END OF SESSION:  PT End of Session - 02/24/24 1042     Visit Number 37    Number of Visits 42    Date for PT Re-Evaluation 03/30/24    Authorization Type [employer]    Progress Note Due on Visit 44    PT Start Time 1019    PT Stop Time 1058    PT Time Calculation (min) 39 min    Equipment Utilized During Treatment Gait belt    Activity Tolerance Patient tolerated treatment well    Behavior During Therapy WFL for tasks assessed/performed;Anxious             Past Medical History:  Diagnosis Date   Allergy    Contact lens/glasses fitting    Hypertension    Past Surgical History:  Procedure Laterality Date   ABDOMINAL HYSTERECTOMY     BREAST SURGERY  2010   rt lumpectomy-neg   IR IMAGING GUIDED PORT INSERTION  12/18/2022   LAMINECTOMY N/A 09/21/2022   Procedure: THORACIC LAMINECTOMY FOR TUMOR;  Surgeon: Joshua Alm RAMAN, MD;  Location: [facility] OR;  Service: Neurosurgery;  Laterality: N/A;   OPEN REDUCTION INTERNAL FIXATION (ORIF) DISTAL RADIAL FRACTURE Left 11/26/2013   Procedure: OPEN REDUCTION INTERNAL FIXATION (ORIF) LEFT  DISTAL RADIUS ;  Surgeon: Dempsey JINNY Sensor, MD;  Location: [facility];  Service: Orthopedics;  Laterality: Left;  with block   TUBAL LIGATION     Patient Active Problem List   Diagnosis Date Noted   Acute deep vein thrombosis (DVT) of femoral vein of both lower extremities (HCC) 03/19/2023   Normocytic anemia 03/01/2023   Moderate protein malnutrition (HCC) 03/01/2023   Acute respiratory failure with hypoxia (HCC) 03/01/2023   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   Cardiomegaly 03/01/2023   Pleural effusion due to CHF (congestive heart failure) (HCC) 03/01/2023   Port-A-Cath in place  12/30/2022   Encounter for antineoplastic immunotherapy 11/18/2022   Encounter for antineoplastic chemotherapy 11/18/2022   Constipation 11/05/2022   Goals of care, counseling/discussion 10/21/2022   Cancer, metastatic to bone (HCC) 10/04/2022   Adenocarcinoma of right lung, stage 4 (HCC) 10/01/2022   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 09/20/2022   Hypokalemia 09/20/2022   Lung mass 09/20/2022   Hypothyroidism 09/20/2022   Cholelithiasis 09/20/2022   Lumbar spondylosis 08/26/2022   Thoracic back pain 04/04/2022   Low back pain 04/04/2022   Hypertension 09/23/2011    ONSET DATE: 08/08/2023 (MD order)  REFERRING DIAG:  Z91.81 (ICD-10-CM) - History of falling  C34.91 (ICD-10-CM) - Malignant neoplasm of unspecified part of right bronchus or lung    THERAPY DIAG:  Other abnormalities of gait and mobility  Unsteadiness on feet  Muscle weakness (generalized)  Other symptoms and signs involving the musculoskeletal system  Difficulty in walking, not elsewhere classified  Rationale for Evaluation and Treatment: Rehabilitation  SUBJECTIVE:  SUBJECTIVE STATEMENT: Feeling tired and I don't know. Suspects that this is probably d/t effects of chemo. Reports that she went to see a movie with her cane for the first time.   Pt accompanied by: self  PERTINENT HISTORY: Stage IV (T1b, N2, M1 C) non-small cell lung cancer, adenocarcinoma presented with right lower lobe lung nodule in addition to small bilateral pulmonary nodules and right hilar and mediastinal lymphadenopathy in addition to liver and bone metastasis diagnosed in April 2024. She is status post  decompressive thoracic laminectomy of the T7 with spinal cord decompression and biopsy of the epidural mass in April 2024.    PAIN:  Are you having pain? Yes: NPRS scale: no/10 Pain location: low back Pain description: sore Aggravating factors: bending, sitting wrong  Relieving factors: tylenol    PRECAUTIONS: Fall and Other: currently in treatment for lung cancer  RED FLAGS: None   WEIGHT BEARING RESTRICTIONS: No  FALLS: Has patient fallen in last 6 months? No  LIVING ENVIRONMENT: Lives with: lives alone Lives in: House/apartment Stairs: 1 step to get into home Has following equipment at home: Single point cane, Environmental consultant - 4 wheeled, Wheelchair (manual), and shower chair  PLOF: Independent with household mobility with device and Independent with community mobility with device  PATIENT GOALS: Still need to work on balance, endurance, and would like to work on cane more.  OBJECTIVE:    TODAY'S TREATMENT: 02/24/24 Activity Comments  Vitals at start of session  93% spO2, 104 bpm   gait training outside on sidewalk without AD with CGA-min A for 1 instance of LOB 2x159ft with sitting breaks while monitoring vitals and waiting for them to normalize while performing pursed lip breathing   85-94% spO2, 108-118 bpm   squat to airex on chair 2x10 Had to modify by adding in airex pad d/t difficulty lowering into lower squat   step ups on foam No UE support; CGA  gait + balancing tennis ball on flat surface  Pt apprehensive and guarded and requires breaks to stop, breathe, relax shoulders  88-95% spO2, 122 bpm. Allowed sitting break to to normalize vitals         HOME EXERCISE PROGRAM: Access Code: [email or web address] URL: [email or web address] Date: 11/18/2023 Prepared by: [facility] - Outpatient  Rehab - [facility]  Program Notes practice walking inside the house with your cane for 5 minutes per day.   Exercises - Seated Hamstring Curls with Resistance  - 1 x daily -  5 x weekly - 3 sets - 10 reps - Seated Knee Extension with Resistance  - 1 x daily - 5 x weekly - 3 sets - 10 reps - Seated Hip Abduction with Resistance  - 1 x daily - 5 x weekly - 2 sets - 10 reps - Side Stepping with Counter Support  - 1 x daily - 5 x weekly - 2-3 sets - 10 reps - Alternating Step Taps with Counter Support  - 1 x daily - 5 x weekly - 3 sets - 10 reps - Backward Walking with Counter Support  - 1 x daily - 5 x weekly - 1 sets - 5 reps - Squat with Chair Touch  - 1 x daily - 5 x weekly - 2 sets - 10 reps - Corner Balance Feet Together With Eyes Open  - 1 x daily - 5 x weekly - 2 sets - 30 sec hold - Corner Balance Feet Apart: Eyes Open With Head Turns  - 1 x daily - 5  x weekly - 2 sets - 30 sec hold - Romberg Stance with Head Nods  - 1 x daily - 5 x weekly - 2 sets - 30 sec hold - Stride Stance Weight Shift  - 1-2 x daily - 7 x weekly - 1-2 sets - 10 reps - Standing Gastroc Stretch on Step  - 1 x daily - 7 x weekly - 1 sets - 3 reps - 15-30 sec hold - Seated Figure 4 Piriformis Stretch  - 1 x daily - 7 x weekly - 1 sets - 3 reps - 15 sec hold       Note: Objective measures were completed at Evaluation unless otherwise noted.  DIAGNOSTIC FINDINGS: NA for this episode  COGNITION: Overall cognitive status: Within functional limits for tasks assessed   SENSATION: Light touch: WFL Sometimes reports pressure/heaviness in feet  MUSCLE TONE: WFL BLEs   POSTURE: rounded shoulders and forward head  LOWER EXTREMITY ROM:   ACTIVE ROM WFL  LOWER EXTREMITY MMT:    MMT Right Eval Left Eval  Hip flexion 3+ 3+  Hip extension    Hip abduction 4 4  Hip adduction 4 4  Hip internal rotation    Hip external rotation    Knee flexion 3+ 4  Knee extension 4 4  Ankle dorsiflexion 3+ 3+  Ankle plantarflexion    Ankle inversion    Ankle eversion    (Blank rows = not tested)    TRANSFERS: Assistive device utilized: Environmental consultant - 4 wheeled  Sit to stand: Modified  independence Stand to sit: Modified independence   GAIT: Gait pattern: step through pattern, decreased step length- Right, decreased stride length, and wide BOS Distance walked: 50 ft x 2 Assistive device utilized: Single point cane and Walker - 4 wheeled Level of assistance: SBA and CGA Comments: Pt brought in her cane (round tip base with SPC)  FUNCTIONAL TESTS:  5 times sit to stand: 13.63 sec arms crossed at chest Timed up and go (TUG): 17.37 with rollator; 20.91 sec with cane  10 meter walk test: 18.97 sec with rollator (1.73 ft/sec); 27.19 sec with cane (1.21 f/tsec) Lars Balance Scale: 32/56                                                                                                                                          GOALS: Goals reviewed with patient? Yes  SHORT TERM GOALS: Target date: 03/02/2024      Pt will be independent with HEP for improved strength, balance, gait. Baseline: reports some days better than others Goal status: MET 09/22/23  2.  Pt will improve 5x sit<>stand to less than or equal to 12 sec to demonstrate improved functional strength and transfer efficiency. Baseline:  13.63 sec; 14.49 sec without UE support 09/22/23 Goal status: NOT MET 09/22/23  3.  Pt will improve Berg score to at least 38/56 to  decrease fall risk. Baseline: 32/56; 49/56 09/22/23 Goal status: MET 09/22/23  4.  Pt will improve gait velocity to at least 2 ft/sec with rollator for improved gait efficiency and safety. Baseline: 1.72 ft/sec; 2.1 ft/sec 09/22/23 Goal status: MET 09/22/23  5.  Demo ability to pick up items from ground without UE support to improve safety in home  Baseline: UE support; no support  Goal status: MET  6.  Report ability to ambulate household distances 50% of the time without AD  Baseline: 10%  Goal status: IN PROGRESS  LONG TERM GOALS: Target date: 10/10/2023>UPDATED TARGET 11/21/2023>UPDATED TARGET 03/30/2024      Pt will be  independent with progression of HEP for improved strength, balance, gait. Baseline: continues to be updated Goal status: IN PROGRESS  11/20/2023  2.  Pt will improve TUG score to less than or equal to 13.5 sec for decreased fall risk. Baseline: 15.9 sec cane 11/20/2023; 14 sec w/ cane; 15 sec w/ cane, 14 sec w/out cane Goal status: IN PROGRESS 02/03/24  3.  Pt will improve Berg score to at least 45/56 to decrease fall risk. Baseline: 32/56; 49/56 09/22/23 Goal status: MET 09/22/23   5.  Pt will verbalize continued community fitness upon d/c from PT. Baseline: have provided education, endorse at home Youtube videos Goal status: IN PROGRESS 02/03/24  6. Pt will improve DGI score to at least 18/24 to decrease fall risk.  Baseline:  11/24>15/24; 16/24; 16/24  Goal status:  IN PROGRESS 02/03/24  7.  Pt will negotiate single curb step with cane and modified independent for improved community mobility.  Baseline:  CGA 11/20/2023; Supervision w/ cane; modified indep  Goal status:  Goal met  8.  Pt will improve gait velocity to at least 2.3 ft/sec for improved gait efficiency and safety.  Baseline:  2.07 ft/sec; 1.7 ft/sec w/ cane; 1.8 ft/sec w/ cane  Goal status:  IN PROGRESS, 02/03/24    9.  Pt will perform Condition 4 on MCTSIB 30 sec with mod sway or better for improved balance.    Baseline:  19 sec; moderate x 9 sec; 30 sec mild-mod    Goal status:  MET  10. to improve to at least 645' to demo MCID    Baseline: 475' 02/17/24    Goal status: INITIAL  ASSESSMENT:  CLINICAL IMPRESSION: Patient arrived to session with report of fatigue which she suspects is related to ongoing chemo treatment. Vitals at start of session revealed ongoing tachycardia but improved compared to last session and patient otherwise asymptomatic. Gait training without AD revealed apprehension and mild instability. Patient's spO2 levels reduced to 85% at lowest after activity which took a few minutes to recover- this  is somewhat anticipated d/t hx of lung CA. Patient's HR also increased up to 122bpm today and this is partially due to patient's apprehension with gait training today. Vitals returned to baseline upon leaving and patient asymptomatic.   OBJECTIVE IMPAIRMENTS: Abnormal gait, decreased balance, decreased mobility, difficulty walking, and decreased strength.   ACTIVITY LIMITATIONS: bending, standing, squatting, transfers, bed mobility, and locomotion level  PARTICIPATION LIMITATIONS: driving, shopping, and community activity  PERSONAL FACTORS: 3+ comorbidities: see above are also affecting patient's functional outcome.   REHAB POTENTIAL: Good  CLINICAL DECISION MAKING: Evolving/moderate complexity  EVALUATION COMPLEXITY: Moderate  PLAN:  PT FREQUENCY: 1x/week  PT DURATION: 8 weeks   PLANNED INTERVENTIONS: 97750- Physical Performance Testing, 97110-Therapeutic exercises, 97530- Therapeutic activity, [id number]- Neuromuscular re-education, 97535- Self Care, [id number]- Manual therapy, 02883-  Gait training, Patient/Family education, and Balance training  PLAN FOR NEXT SESSION:  gait w/out AD, squat lift items from floor, aerobics/breathing,    Louana Terrilyn Christians, PT, DPT 02/24/24 11:01 AM   Outpatient Rehab at [facility] [address] [place], [address] [place], [place] [address] Phone # [phone] Fax # [phone]

## 2024-02-24 ENCOUNTER — Ambulatory Visit: Admitting: Physical Therapy

## 2024-02-24 ENCOUNTER — Encounter: Payer: Self-pay | Admitting: Physical Therapy

## 2024-02-24 DIAGNOSIS — M6281 Muscle weakness (generalized): Secondary | ICD-10-CM

## 2024-02-24 DIAGNOSIS — R2689 Other abnormalities of gait and mobility: Secondary | ICD-10-CM

## 2024-02-24 DIAGNOSIS — R29898 Other symptoms and signs involving the musculoskeletal system: Secondary | ICD-10-CM

## 2024-02-24 DIAGNOSIS — R2681 Unsteadiness on feet: Secondary | ICD-10-CM

## 2024-02-24 DIAGNOSIS — R262 Difficulty in walking, not elsewhere classified: Secondary | ICD-10-CM

## 2024-02-26 NOTE — Progress Notes (Signed)
 [facility] OFFICE PROGRESS NOTE  Stamey, Hayley POUR, FNP [address] [place] [address]  DIAGNOSIS: Stage IV (T1b, N2, M1 C) non-small cell lung cancer, adenocarcinoma presented with right lower lobe lung nodule in addition to small bilateral pulmonary nodules and right hilar and mediastinal lymphadenopathy in addition to liver and bone metastasis diagnosed in April 2024. 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   PDL1: 20%   Guardant 360: no actionable mutations.  PRIOR THERAPY: 1) Decompressive thoracic laminectomy of the T7 with spinal cord decompression and biopsy of the epidural mass in April 2024.  2) Palliative radiation to the bone under the care of Dr. Izell. Last day scheduled for 10/25/22   CURRENT THERAPY: Carboplatin  for an AUC of 5, Alimta  500 mg/m, Keytruda  200 mg IV every 3 weeks.  First dose expected on 10/28/2022. Status post 21 cycles.  Starting from cycle #5 the patient is on maintenance treatment with Alimta  and Keytruda  every 3 weeks.  Starting from cycle #7 the patient will be treated with single agent Alimta  500 Mg/M2 every 3 weeks.  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   INTERVAL HISTORY: Hayley Wu 73 y.o. female returns to the clinic today for a follow-up visit. She was last seen by Dr. Sherrod 3 weeks ago. She is currently on single agent Alimta . Keytruda  was discontinued due to suspicious immunotherapy mediated pneumonitis after a hospitalization in September 2024.  In the interval since last being seen, she denies any major changes in her health. She denies fever, chills, or night sweats. She has a large effusion noted on imaging studies but she is hesitant to undergo thoracentesis due to the logistics with needles, especially since she does not notice any significant breathing concerns.    She does engage in PT a few times a week. Sometimes her oxygen  saturation drops with exertion but she states it recovers quickly.   No fevers, chills, night sweats, or significant changes in shortness of breath. No coughing, chest pain, or hemoptysis.  Her heart rate typically increases to 110-112 bpm after walking but returns to normal shortly after resting. She does not wear oxygen  regularly.  Her appetite remains low, with a weight loss of a couple of pounds over the past week due to decreased hunger and some foods tasting unpleasant. No nausea, but she describes a 'weird' stomach feeling. No typical reflux or heartburn. Foods like lettuce taste like dirt, while [place] food is more appealing. She is on Remeron , which has not significantly helped her appetite.  No vomiting, diarrhea, constipation, headaches, or vision changes. No significant changes in shortness of breath or new symptoms since her last visit.   She is here for evaluation and repeat blood work before undergoing cycle #22.   MEDICAL HISTORY: Past Medical History:  Diagnosis Date   Allergy    Contact lens/glasses fitting    Hypertension     ALLERGIES:  is allergic to erythromycin, amoxicillin, latex, and prednisone .  MEDICATIONS:  Current Outpatient Medications  Medication Sig Dispense Refill   acetaminophen  (TYLENOL ) 500 MG tablet Take 500 mg by mouth every 6 (six) hours as needed for moderate pain.     amLODipine  (NORVASC ) 10 MG tablet Take 10 mg by mouth daily.     apixaban  (ELIQUIS ) 5 MG TABS tablet Take 1 tablet (5 mg total) by mouth 2 (two) times  daily. 60 tablet 2   folic acid  (FOLVITE ) 1 MG tablet Take 1 tablet (1 mg total) by mouth daily. 90 tablet 0   Ipratropium-Albuterol  (COMBIVENT ) 20-100 MCG/ACT AERS respimat Inhale 1 puff into the lungs every 6 (six) hours as needed for wheezing or shortness of breath. 4 g 1   levothyroxine  (SYNTHROID ) 150 MCG tablet Take 150 mcg by mouth daily before breakfast.      lidocaine -prilocaine  (EMLA ) cream Apply 1 Application topically as needed. 30 g 2   mirtazapine  (REMERON ) 30 MG tablet TAKE 1 TABLET BY MOUTH AT BEDTIME 90 tablet 0   potassium chloride  SA (KLOR-CON  M) 20 MEQ tablet Take 1 tablet (20 mEq total) by mouth daily. (Patient not taking: Reported on 01/01/2024) 6 tablet 0   prochlorperazine  (COMPAZINE ) 10 MG tablet Take 1 tablet (10 mg total) by mouth every 6 (six) hours as needed. 30 tablet 2   VITAMIN D PO Take 1 tablet by mouth daily.     No current facility-administered medications for this visit.    SURGICAL HISTORY:  Past Surgical History:  Procedure Laterality Date   ABDOMINAL HYSTERECTOMY     BREAST SURGERY  2010   rt lumpectomy-neg   IR IMAGING GUIDED PORT INSERTION  12/18/2022   LAMINECTOMY N/A 09/21/2022   Procedure: THORACIC LAMINECTOMY FOR TUMOR;  Surgeon: Joshua Alm RAMAN, MD;  Location: [facility] OR;  Service: Neurosurgery;  Laterality: N/A;   OPEN REDUCTION INTERNAL FIXATION (ORIF) DISTAL RADIAL FRACTURE Left 11/26/2013   Procedure: OPEN REDUCTION INTERNAL FIXATION (ORIF) LEFT  DISTAL RADIUS ;  Surgeon: Dempsey JINNY Sensor, MD;  Location:  SURGERY CENTER;  Service: Orthopedics;  Laterality: Left;  with block   TUBAL LIGATION      REVIEW OF SYSTEMS:   Review of Systems  Constitutional: Positive for stable fatigue and stable diminished appetite. Negative for chills, fever and unexpected weight change.  HENT: Positive for occasional nasal congestion. Negative for mouth sores, nosebleeds, sore throat and trouble swallowing.   Eyes: Negative for eye problems and icterus.  Respiratory: Positive for stable dyspnea on exertion. Negative for cough, hemoptysis, and wheezing.     Cardiovascular: Negative for chest pain and swelling.  Gastrointestinal: Negative for abdominal pain, nausea, constipation, diarrhea, and vomiting.  Genitourinary: Negative for bladder incontinence, difficulty urinating, dysuria, frequency and hematuria.    Musculoskeletal: Negative for back pain, gait problem, neck pain and neck stiffness.  Skin: Negative for itching and rash.  Neurological: Negative for dizziness, extremity weakness, gait problem, headaches, light-headedness and seizures.  Hematological: Negative for adenopathy. Does not bruise/bleed easily.  Psychiatric/Behavioral: Negative for confusion, depression and sleep disturbance. The patient is not nervous/anxious.     PHYSICAL EXAMINATION:  Blood pressure 135/87, pulse (!) 115, temperature 98.2 F (36.8 C), temperature source Temporal, resp. rate 16, weight 158 lb 11.2 oz (72 kg), SpO2 94%.  ECOG PERFORMANCE STATUS: 1  Physical Exam  Constitutional: Oriented to person, place, and time and well-developed, well-nourished, and in no distress. HENT:  Head: Normocephalic and atraumatic.  Mouth/Throat: Oropharynx is clear and moist. No oropharyngeal exudate.  Eyes: Conjunctivae are normal. Right eye exhibits no discharge. Left eye exhibits no discharge. No scleral icterus.  Neck: Normal range of motion. Neck supple.  Cardiovascular: Normal rate, regular rhythm, normal heart sounds and intact distal pulses.   Pulmonary/Chest: Effort normal. Decreased breath sounds in right lung. No respiratory distress. No wheezes. No rales.  Abdominal: Soft. Bowel sounds are normal. Exhibits no distension and no mass. There is no  tenderness.  Musculoskeletal: Normal range of motion.  Lymphadenopathy:    No cervical adenopathy.  Neurological: Alert and oriented to person, place, and time. Exhibits muscle wasting. Ambulates with a walker.  Skin: Skin is warm and dry. No rash noted. Not diaphoretic. No erythema. No pallor.  Psychiatric: Mood, memory and judgment normal.  Vitals reviewed.  LABORATORY DATA: Lab Results  Component Value Date   WBC 6.0 03/04/2024   HGB 13.7 03/04/2024   HCT 41.8 03/04/2024   MCV 88.4 03/04/2024   PLT 251 03/04/2024      Chemistry      Component Value  Date/Time   NA 141 02/12/2024 1324   K 3.5 02/12/2024 1324   CL 105 02/12/2024 1324   CO2 28 02/12/2024 1324   BUN 9 02/12/2024 1324   CREATININE 0.61 02/12/2024 1324      Component Value Date/Time   CALCIUM 9.2 02/12/2024 1324   ALKPHOS 84 02/12/2024 1324   AST 18 02/12/2024 1324   ALT 11 02/12/2024 1324   BILITOT 0.4 02/12/2024 1324       RADIOGRAPHIC STUDIES:  No results found.   ASSESSMENT/PLAN:  This is a very pleasant 73 year old Caucasian female recently diagnosed with stage IV (T1b, N2, M1 C) non-small cell lung cancer, adenocarcinoma presented with right lower lobe lung nodule in addition to small bilateral pulmonary nodules and right hilar and mediastinal lymphadenopathy in addition to liver and bone metastasis diagnosed in April 2024. 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 Her PDL1 expression is 20%. She has no actionable mutations.    She is completed palliative radiation to the painful metastatic bone lesions under the care of Dr. Izell in the last day radiation was on 10/25/2022.    She is currently undergoing systemic chemotherapy with carboplatin  for an AUC of 5, Alimta  500 mg/m, and Keytruda  200 mg IV every 3 weeks. She is status post 21 cycles.  Starting from cycle #5 she was on maintenance treatment with Alimta  and Keytruda .  Starting after cycle #6 Keytruda  was discontinued from the care plan due to suspicious immunotherapy mediated pneumonitis.  Therefore, she is currently undergoing single agent chemotherapy with Alimta  IV every 3 weeks.   Labs were reviewed. Her CMP is pending. As long as this is in parameters, recommend she proceed with cycle #22 today as scheduled. She is ok to treat with her pulse of 115 bmp.   We will see her back for follow-up visit in 3 weeks for evaluation and repeat blood work before undergoing cycle #23    I have added orders to  recheck her TSH and T4 at her next appointment.    Encourage use of protein drinks and small frequent meals. Discussed salt water rinses and biotene.    We talked about the pleural effusion.  She denies any unusual symptoms at this time. I did advised with a large effusion like that I would recommend thoracentesis. She understand but is hesitant to undergo an invasive procedure and reports her breathing is stable. We will keep monitoring but she is advised to please contact us  if she feels like she has any worsening in her breathing as I would recommend a thoracentesis at that time.  I did discuss risks and benefits of thoracentesis.   She will continue with PT once a week.  Suggested trial of indigestion medication and nausea medication before meals.   The  patient was advised to call immediately if she has any concerning symptoms in the interval. The patient voices understanding of current disease status and treatment options and is in agreement with the current care plan. All questions were answered. The patient knows to call the clinic with any problems, questions or concerns. We can certainly see the patient much sooner if necessary    Orders Placed This Encounter  Procedures   T4, free    Standing Status:   Future    Expected Date:   03/25/2024    Expiration Date:   03/04/2025   TSH    Standing Status:   Future    Expected Date:   03/25/2024    Expiration Date:   03/04/2025    The total time spent in the appointment was 20-29 minutes  Michale Emmerich L Louise Victory, PA-C 03/04/24

## 2024-03-02 ENCOUNTER — Ambulatory Visit: Admitting: Physical Therapy

## 2024-03-02 ENCOUNTER — Encounter: Payer: Self-pay | Admitting: Physical Therapy

## 2024-03-02 DIAGNOSIS — R2689 Other abnormalities of gait and mobility: Secondary | ICD-10-CM

## 2024-03-02 DIAGNOSIS — R2681 Unsteadiness on feet: Secondary | ICD-10-CM

## 2024-03-02 DIAGNOSIS — M6281 Muscle weakness (generalized): Secondary | ICD-10-CM

## 2024-03-02 NOTE — Therapy (Signed)
 OUTPATIENT PHYSICAL THERAPY NEURO TREATMENT   Patient Name: Hayley Wu MRN: [id number] DOB:01/24/1951, 73 y.o., female Today's Date: 03/02/2024  PCP: Lanis Thresa JAYSON DEVONNA REFERRING PROVIDER: Crecencio [name], FNP     END OF SESSION:  PT End of Session - 03/02/24 1018     Visit Number 38    Number of Visits 42    Date for Recertification  03/30/24    Authorization Type [employer]    Progress Note Due on Visit 44    PT Start Time 1019    PT Stop Time 1100    PT Time Calculation (min) 41 min    Equipment Utilized During Treatment Gait belt    Activity Tolerance Patient tolerated treatment well    Behavior During Therapy WFL for tasks assessed/performed              Past Medical History:  Diagnosis Date   Allergy    Contact lens/glasses fitting    Hypertension    Past Surgical History:  Procedure Laterality Date   ABDOMINAL HYSTERECTOMY     BREAST SURGERY  2010   rt lumpectomy-neg   IR IMAGING GUIDED PORT INSERTION  12/18/2022   LAMINECTOMY N/A 09/21/2022   Procedure: THORACIC LAMINECTOMY FOR TUMOR;  Surgeon: Joshua Alm RAMAN, MD;  Location: [facility];  Service: Neurosurgery;  Laterality: N/A;   OPEN REDUCTION INTERNAL FIXATION (ORIF) DISTAL RADIAL FRACTURE Left 11/26/2013   Procedure: OPEN REDUCTION INTERNAL FIXATION (ORIF) LEFT  DISTAL RADIUS ;  Surgeon: Dempsey JINNY Sensor, MD;  Location: [facility];  Service: Orthopedics;  Laterality: Left;  with block   TUBAL LIGATION     Patient Active Problem List   Diagnosis Date Noted   Acute deep vein thrombosis (DVT) of femoral vein of both lower extremities (HCC) 03/19/2023   Normocytic anemia 03/01/2023   Moderate protein malnutrition 03/01/2023   Acute respiratory failure with hypoxia (HCC) 03/01/2023   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   Cardiomegaly 03/01/2023   Pleural effusion due to CHF (congestive heart failure) (HCC) 03/01/2023   Port-A-Cath in place 12/30/2022    Encounter for antineoplastic immunotherapy 11/18/2022   Encounter for antineoplastic chemotherapy 11/18/2022   Constipation 11/05/2022   Goals of care, counseling/discussion 10/21/2022   Cancer, metastatic to bone (HCC) 10/04/2022   Adenocarcinoma of right lung, stage 4 (HCC) 10/01/2022   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 09/20/2022   Hypokalemia 09/20/2022   Lung mass 09/20/2022   Hypothyroidism 09/20/2022   Cholelithiasis 09/20/2022   Lumbar spondylosis 08/26/2022   Thoracic back pain 04/04/2022   Low back pain 04/04/2022   Hypertension 09/23/2011    ONSET DATE: 08/08/2023 (MD order)  REFERRING DIAG:  Z91.81 (ICD-10-CM) - History of falling  C34.91 (ICD-10-CM) - Malignant neoplasm of unspecified part of right bronchus or lung    THERAPY DIAG:  Other abnormalities of gait and mobility  Unsteadiness on feet  Muscle weakness (generalized)  Rationale for Evaluation and Treatment: Rehabilitation  SUBJECTIVE:  SUBJECTIVE STATEMENT: Things are going fine.  The last CT showed fluid on my lung and they will need to tap it out.  Trying to wait as long as possible.  Have chemo Thursday so we will check again.  Feel somewhat congested this morning.  Pt accompanied by: self  PERTINENT HISTORY: Stage IV (T1b, N2, M1 C) non-small cell lung cancer, adenocarcinoma presented with right lower lobe lung nodule in addition to small bilateral pulmonary nodules and right hilar and mediastinal lymphadenopathy in addition to liver and bone metastasis diagnosed in April 2024. She is status post decompressive thoracic laminectomy of the T7 with spinal cord decompression and  biopsy of the epidural mass in April 2024.    PAIN:  Are you having pain? Yes: NPRS scale: no/10 Pain location: low back Pain description: sore Aggravating factors: bending, sitting wrong  Relieving factors: tylenol    PRECAUTIONS: Fall and Other: currently in treatment for lung cancer  RED FLAGS: None   WEIGHT BEARING RESTRICTIONS: No  FALLS: Has patient fallen in last 6 months? No  LIVING ENVIRONMENT: Lives with: lives alone Lives in: House/apartment Stairs: 1 step to get into home Has following equipment at home: Single point cane, Environmental consultant - 4 wheeled, Wheelchair (manual), and shower chair  PLOF: Independent with household mobility with device and Independent with community mobility with device  PATIENT GOALS: Still need to work on balance, endurance, and would like to work on cane more.  OBJECTIVE:    TODAY'S TREATMENT: 03/02/2024 Activity Comments  Vitals at start of session  92-94 % spO2, 105 bpm  Sit to stand with TUG (10 ft x 2) and return to sit, 5 reps No device; 83-90%, 112 bpm  Squat to stand 2 x 10 reps   Alt step taps to 8 step, x 30 sec, then standing on Airex x 30 sec 84-91% , 115 bpm  Curb negotiation with 1 handrail, then no UE support, 2 reps each   Gait with curb negotiation and then stepping around/over obstacles 82>90% , 112 bpm  Four square step activity over canes, then to targets Unsteadiness to L and in posterior direction      HOME EXERCISE PROGRAM: Access Code: [email or web address] URL: [email or web address] Date: 11/18/2023 Prepared by: [facility] - Outpatient  Rehab - [facility]  Program Notes practice walking inside the house with your cane for 5 minutes per day.   Exercises - Seated Hamstring Curls with Resistance  - 1 x daily - 5 x weekly - 3 sets - 10 reps - Seated Knee Extension with Resistance  - 1 x daily - 5 x weekly - 3 sets - 10 reps - Seated Hip Abduction with Resistance  - 1 x daily - 5 x weekly - 2 sets - 10  reps - Side Stepping with Counter Support  - 1 x daily - 5 x weekly - 2-3 sets - 10 reps - Alternating Step Taps with Counter Support  - 1 x daily - 5 x weekly - 3 sets - 10 reps - Backward Walking with Counter Support  - 1 x daily - 5 x weekly - 1 sets - 5 reps - Squat with Chair Touch  - 1 x daily - 5 x weekly - 2 sets - 10 reps - Corner Balance Feet Together With Eyes Open  - 1 x daily - 5 x weekly - 2 sets - 30 sec hold - Corner Balance Feet Apart: Eyes Open With Head Turns  - 1 x daily - 5  x weekly - 2 sets - 30 sec hold - Romberg Stance with Head Nods  - 1 x daily - 5 x weekly - 2 sets - 30 sec hold - Stride Stance Weight Shift  - 1-2 x daily - 7 x weekly - 1-2 sets - 10 reps - Standing Gastroc Stretch on Step  - 1 x daily - 7 x weekly - 1 sets - 3 reps - 15-30 sec hold - Seated Figure 4 Piriformis Stretch  - 1 x daily - 7 x weekly - 1 sets - 3 reps - 15 sec hold    PATIENT EDUCATION: Education details: monitoring O2 sats at home and discussing with MD Person educated: Patient Education method: Explanation Education comprehension: verbalized understanding   Note: Objective measures were completed at Evaluation unless otherwise noted.  DIAGNOSTIC FINDINGS: NA for this episode  COGNITION: Overall cognitive status: Within functional limits for tasks assessed   SENSATION: Light touch: WFL Sometimes reports pressure/heaviness in feet  MUSCLE TONE: WFL BLEs   POSTURE: rounded shoulders and forward head  LOWER EXTREMITY ROM:   ACTIVE ROM WFL  LOWER EXTREMITY MMT:    MMT Right Eval Left Eval  Hip flexion 3+ 3+  Hip extension    Hip abduction 4 4  Hip adduction 4 4  Hip internal rotation    Hip external rotation    Knee flexion 3+ 4  Knee extension 4 4  Ankle dorsiflexion 3+ 3+  Ankle plantarflexion    Ankle inversion    Ankle eversion    (Blank rows = not tested)    TRANSFERS: Assistive device utilized: Environmental consultant - 4 wheeled  Sit to stand: Modified  independence Stand to sit: Modified independence   GAIT: Gait pattern: step through pattern, decreased step length- Right, decreased stride length, and wide BOS Distance walked: 50 ft x 2 Assistive device utilized: Single point cane and Walker - 4 wheeled Level of assistance: SBA and CGA Comments: Pt brought in her cane (round tip base with SPC)  FUNCTIONAL TESTS:  5 times sit to stand: 13.63 sec arms crossed at chest Timed up and go (TUG): 17.37 with rollator; 20.91 sec with cane  10 meter walk test: 18.97 sec with rollator (1.73 ft/sec); 27.19 sec with cane (1.21 f/tsec) Lars Balance Scale: 32/56                                                                                                                                          GOALS: Goals reviewed with patient? Yes  SHORT TERM GOALS: Target date: 03/02/2024      Pt will be independent with HEP for improved strength, balance, gait. Baseline: reports some days better than others Goal status: MET 09/22/23  2.  Pt will improve 5x sit<>stand to less than or equal to 12 sec to demonstrate improved functional strength and transfer efficiency. Baseline:  13.63 sec; 14.49 sec  without UE support 09/22/23 Goal status: NOT MET 09/22/23  3.  Pt will improve Berg score to at least 38/56 to decrease fall risk. Baseline: 32/56; 49/56 09/22/23 Goal status: MET 09/22/23  4.  Pt will improve gait velocity to at least 2 ft/sec with rollator for improved gait efficiency and safety. Baseline: 1.72 ft/sec; 2.1 ft/sec 09/22/23 Goal status: MET 09/22/23  5.  Demo ability to pick up items from ground without UE support to improve safety in home  Baseline: UE support; no support  Goal status: MET  6.  Report ability to ambulate household distances 50% of the time without AD  Baseline: 10%  Goal status: IN PROGRESS  LONG TERM GOALS: Target date: 10/10/2023>UPDATED TARGET 11/21/2023>UPDATED TARGET 03/30/2024      Pt will be  independent with progression of HEP for improved strength, balance, gait. Baseline: continues to be updated Goal status: IN PROGRESS  11/20/2023  2.  Pt will improve TUG score to less than or equal to 13.5 sec for decreased fall risk. Baseline: 15.9 sec cane 11/20/2023; 14 sec w/ cane; 15 sec w/ cane, 14 sec w/out cane Goal status: IN PROGRESS 02/03/24  3.  Pt will improve Berg score to at least 45/56 to decrease fall risk. Baseline: 32/56; 49/56 09/22/23 Goal status: MET 09/22/23   5.  Pt will verbalize continued community fitness upon d/c from PT. Baseline: have provided education, endorse at home Youtube videos Goal status: IN PROGRESS 02/03/24  6. Pt will improve DGI score to at least 18/24 to decrease fall risk.  Baseline:  11/24>15/24; 16/24; 16/24  Goal status:  IN PROGRESS 02/03/24  7.  Pt will negotiate single curb step with cane and modified independent for improved community mobility.  Baseline:  CGA 11/20/2023; Supervision w/ cane; modified indep  Goal status:  Goal met  8.  Pt will improve gait velocity to at least 2.3 ft/sec for improved gait efficiency and safety.  Baseline:  2.07 ft/sec; 1.7 ft/sec w/ cane; 1.8 ft/sec w/ cane  Goal status:  IN PROGRESS, 02/03/24    9.  Pt will perform Condition 4 on MCTSIB 30 sec with mod sway or better for improved balance.    Baseline:  19 sec; moderate x 9 sec; 30 sec mild-mod    Goal status:  MET  10. to improve to at least 645' to demo MCID    Baseline: 475' 02/17/24    Goal status: INITIAL  ASSESSMENT:  CLINICAL IMPRESSION: Patient arrived to session with report of congestion today and plans for next chemo treatment Thursday. Vitals at start of session revealed ongoing tachycardia, but able to proceed with activities in session, with several rest breaks.  Short distance gait training without AD and dynamic balance activities revealed mild instability. Patient's spO2 levels reduced to 82% at lowest after activity which took a  few minutes to recover- somewhat anticipated d/t hx of lung CA and pt plans to mention to her MD at visit Thursday. Vitals monitored throughout session, returning to baseline WFL at end of session without complaints.  OBJECTIVE IMPAIRMENTS: Abnormal gait, decreased balance, decreased mobility, difficulty walking, and decreased strength.   ACTIVITY LIMITATIONS: bending, standing, squatting, transfers, bed mobility, and locomotion level  PARTICIPATION LIMITATIONS: driving, shopping, and community activity  PERSONAL FACTORS: 3+ comorbidities: see above are also affecting patient's functional outcome.   REHAB POTENTIAL: Good  CLINICAL DECISION MAKING: Evolving/moderate complexity  EVALUATION COMPLEXITY: Moderate  PLAN:  PT FREQUENCY: 1x/week  PT DURATION: 8 weeks  PLANNED INTERVENTIONS: 97750- Physical Performance Testing, 97110-Therapeutic exercises, 97530- Therapeutic activity, 97112- Neuromuscular re-education, 97535- Self Care, 02859- Manual therapy, [id number]- Gait training, Patient/Family education, and Balance training  PLAN FOR NEXT SESSION:  dynamic balance and gait w/out AD, squat lift items from floor, working towards [employer], PT 03/02/24 11:11 AM Phone: [phone] Fax: [phone]  [facility] at [facility] [address] [place], [place] [address] Phone # [phone] Fax # [phone]

## 2024-03-04 ENCOUNTER — Ambulatory Visit: Admitting: Physician Assistant

## 2024-03-04 ENCOUNTER — Ambulatory Visit

## 2024-03-04 ENCOUNTER — Inpatient Hospital Stay (HOSPITAL_BASED_OUTPATIENT_CLINIC_OR_DEPARTMENT_OTHER): Admitting: Physician Assistant

## 2024-03-04 ENCOUNTER — Inpatient Hospital Stay

## 2024-03-04 ENCOUNTER — Other Ambulatory Visit

## 2024-03-04 ENCOUNTER — Other Ambulatory Visit: Payer: Self-pay

## 2024-03-04 VITALS — BP 135/87 | HR 115 | Temp 98.2°F | Resp 16 | Wt 158.7 lb

## 2024-03-04 DIAGNOSIS — Z5111 Encounter for antineoplastic chemotherapy: Secondary | ICD-10-CM | POA: Diagnosis not present

## 2024-03-04 DIAGNOSIS — C3491 Malignant neoplasm of unspecified part of right bronchus or lung: Secondary | ICD-10-CM

## 2024-03-04 DIAGNOSIS — E039 Hypothyroidism, unspecified: Secondary | ICD-10-CM | POA: Diagnosis not present

## 2024-03-04 LAB — CMP (CANCER CENTER ONLY)
ALT: 11 U/L (ref 0–44)
AST: 17 U/L (ref 15–41)
Albumin: 4.2 g/dL (ref 3.5–5.0)
Alkaline Phosphatase: 88 U/L (ref 38–126)
Anion gap: 7 (ref 5–15)
BUN: 10 mg/dL (ref 8–23)
CO2: 29 mmol/L (ref 22–32)
Calcium: 9.3 mg/dL (ref 8.9–10.3)
Chloride: 105 mmol/L (ref 98–111)
Creatinine: 0.73 mg/dL (ref 0.44–1.00)
GFR, Estimated: >60 mL/min (ref >60)
Glucose, Bld: 108 mg/dL — ABNORMAL HIGH (ref 70–99)
Potassium: 3.7 mmol/L (ref 3.5–5.1)
Sodium: 141 mmol/L (ref 135–145)
Total Bilirubin: 0.5 mg/dL (ref 0.0–1.2)
Total Protein: 7 g/dL (ref 6.5–8.1)

## 2024-03-04 LAB — CBC WITH DIFFERENTIAL (CANCER CENTER ONLY)
Abs Immature Granulocytes: 0.02 K/uL (ref 0.00–0.07)
Basophils Absolute: 0 K/uL (ref 0.0–0.1)
Basophils Relative: 0 %
Eosinophils Absolute: 0 K/uL (ref 0.0–0.5)
Eosinophils Relative: 1 %
HCT: 41.8 % (ref 36.0–46.0)
Hemoglobin: 13.7 g/dL (ref 12.0–15.0)
Immature Granulocytes: 0 %
Lymphocytes Relative: 10 %
Lymphs Abs: 0.6 K/uL — ABNORMAL LOW (ref 0.7–4.0)
MCH: 29 pg (ref 26.0–34.0)
MCHC: 32.8 g/dL (ref 30.0–36.0)
MCV: 88.4 fL (ref 80.0–100.0)
Monocytes Absolute: 0.4 K/uL (ref 0.1–1.0)
Monocytes Relative: 7 %
Neutro Abs: 5 K/uL (ref 1.7–7.7)
Neutrophils Relative %: 82 %
Platelet Count: 251 K/uL (ref 150–400)
RBC: 4.73 MIL/uL (ref 3.87–5.11)
RDW: 15.2 % (ref 11.5–15.5)
WBC Count: 6 K/uL (ref 4.0–10.5)
nRBC: 0 % (ref 0.0–0.2)

## 2024-03-04 MED ORDER — DEXAMETHASONE SODIUM PHOSPHATE 10 MG/ML IJ SOLN
10.0000 mg | Freq: Once | INTRAMUSCULAR | Status: AC
  Administered 2024-03-04: 10 mg via INTRAVENOUS
  Filled 2024-03-04: qty 1

## 2024-03-04 MED ORDER — PROCHLORPERAZINE MALEATE 10 MG PO TABS
10.0000 mg | ORAL_TABLET | Freq: Once | ORAL | Status: AC
  Administered 2024-03-04: 10 mg via ORAL
  Filled 2024-03-04: qty 1

## 2024-03-04 MED ORDER — SODIUM CHLORIDE 0.9 % IV SOLN: Freq: Once | INTRAVENOUS | Status: AC

## 2024-03-04 MED ORDER — SODIUM CHLORIDE 0.9% FLUSH: 10.0000 mL | INTRAVENOUS | Status: DC | PRN

## 2024-03-04 MED ORDER — SODIUM CHLORIDE 0.9 % IV SOLN
500.0000 mg/m2 | Freq: Once | INTRAVENOUS | Status: AC
  Administered 2024-03-04: 900 mg via INTRAVENOUS
  Filled 2024-03-04: qty 20

## 2024-03-04 NOTE — Patient Instructions (Signed)
 [facility] - A DEPT OF [facility]  Discharge Instructions: Thank you for choosing [facility] to provide your oncology and hematology care.   If you have a lab appointment with the Cancer Center, please go directly to the Cancer Center and check in at the registration area.   Wear comfortable clothing and clothing appropriate for easy access to any Portacath or PICC line.   We strive to give you quality time with your provider. You may need to reschedule your appointment if you arrive late (15 or more minutes).  Arriving late affects you and other patients whose appointments are after yours.  Also, if you miss three or more appointments without notifying the office, you may be dismissed from the clinic at the provider's discretion.      For prescription refill requests, have your pharmacy contact our office and allow 72 hours for refills to be completed.    Today you received the following chemotherapy and/or immunotherapy agents alimta      To help prevent nausea and vomiting after your treatment, we encourage you to take your nausea medication as directed.  BELOW ARE SYMPTOMS THAT SHOULD BE REPORTED IMMEDIATELY: *FEVER GREATER THAN 100.4 F (38 C) OR HIGHER *CHILLS OR SWEATING *NAUSEA AND VOMITING THAT IS NOT CONTROLLED WITH YOUR NAUSEA MEDICATION *UNUSUAL SHORTNESS OF BREATH *UNUSUAL BRUISING OR BLEEDING *URINARY PROBLEMS (pain or burning when urinating, or frequent urination) *BOWEL PROBLEMS (unusual diarrhea, constipation, pain near the anus) TENDERNESS IN MOUTH AND THROAT WITH OR WITHOUT PRESENCE OF ULCERS (sore throat, sores in mouth, or a toothache) UNUSUAL RASH, SWELLING OR PAIN  UNUSUAL VAGINAL DISCHARGE OR ITCHING   Items with * indicate a potential emergency and should be followed up as soon as possible or go to the Emergency Department if any problems should occur.  Please show the CHEMOTHERAPY ALERT CARD or IMMUNOTHERAPY  ALERT CARD at check-in to the Emergency Department and triage nurse.  Should you have questions after your visit or need to cancel or reschedule your appointment, please contact [facility] - A DEPT OF [facility]  Dept: [phone]  and follow the prompts.  Office hours are 8:00 a.m. to 4:30 p.m. Monday - Friday. Please note that voicemails left after 4:00 p.m. may not be returned until the following business day.  We are closed weekends and major holidays. You have access to a nurse at all times for urgent questions. Please call the main number to the clinic Dept: [phone] and follow the prompts.   For any non-urgent questions, you may also contact your provider using MyChart. We now offer e-Visits for anyone 50 and older to request care online for non-urgent symptoms. For details visit [email or web address].   Also download the MyChart app! Go to the app store, search "MyChart", open the app, select [place], and log in with your MyChart username and password.

## 2024-03-08 NOTE — Therapy (Signed)
 OUTPATIENT PHYSICAL THERAPY NEURO TREATMENT   Patient Name: Hayley Wu MRN: [id number] DOB:07-01-1950, 73 y.o., female Today's Date: 03/09/2024  PCP: Lanis Thresa JAYSON DEVONNA REFERRING PROVIDER: Crecencio [name], FNP     END OF SESSION:  PT End of Session - 03/09/24 1056     Visit Number 39    Number of Visits 42    Date for Recertification  03/30/24    Authorization Type [employer]    Progress Note Due on Visit 44    PT Start Time 1019    PT Stop Time 1057    PT Time Calculation (min) 38 min    Equipment Utilized During Treatment Gait belt    Activity Tolerance Patient tolerated treatment well    Behavior During Therapy WFL for tasks assessed/performed               Past Medical History:  Diagnosis Date   Allergy    Contact lens/glasses fitting    Hypertension    Past Surgical History:  Procedure Laterality Date   ABDOMINAL HYSTERECTOMY     BREAST SURGERY  2010   rt lumpectomy-neg   IR IMAGING GUIDED PORT INSERTION  12/18/2022   LAMINECTOMY N/A 09/21/2022   Procedure: THORACIC LAMINECTOMY FOR TUMOR;  Surgeon: Joshua Alm RAMAN, MD;  Location: [facility] OR;  Service: Neurosurgery;  Laterality: N/A;   OPEN REDUCTION INTERNAL FIXATION (ORIF) DISTAL RADIAL FRACTURE Left 11/26/2013   Procedure: OPEN REDUCTION INTERNAL FIXATION (ORIF) LEFT  DISTAL RADIUS ;  Surgeon: Dempsey JINNY Sensor, MD;  Location: [facility];  Service: Orthopedics;  Laterality: Left;  with block   TUBAL LIGATION     Patient Active Problem List   Diagnosis Date Noted   Acute deep vein thrombosis (DVT) of femoral vein of both lower extremities (HCC) 03/19/2023   Normocytic anemia 03/01/2023   Moderate protein malnutrition 03/01/2023   Acute respiratory failure with hypoxia (HCC) 03/01/2023   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   Cardiomegaly 03/01/2023   Pleural effusion due to CHF (congestive heart failure) (HCC) 03/01/2023   Port-A-Cath in place 12/30/2022    Encounter for antineoplastic immunotherapy 11/18/2022   Encounter for antineoplastic chemotherapy 11/18/2022   Constipation 11/05/2022   Goals of care, counseling/discussion 10/21/2022   Cancer, metastatic to bone (HCC) 10/04/2022   Adenocarcinoma of right lung, stage 4 (HCC) 10/01/2022   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 09/20/2022   Hypokalemia 09/20/2022   Lung mass 09/20/2022   Hypothyroidism 09/20/2022   Cholelithiasis 09/20/2022   Lumbar spondylosis 08/26/2022   Thoracic back pain 04/04/2022   Low back pain 04/04/2022   Hypertension 09/23/2011    ONSET DATE: 08/08/2023 (MD order)  REFERRING DIAG:  Z91.81 (ICD-10-CM) - History of falling  C34.91 (ICD-10-CM) - Malignant neoplasm of unspecified part of right bronchus or lung    THERAPY DIAG:  Other abnormalities of gait and mobility  Unsteadiness on feet  Muscle weakness (generalized)  Other symptoms and signs involving the musculoskeletal system  Difficulty in walking, not elsewhere classified  Rationale for Evaluation and Treatment: Rehabilitation  SUBJECTIVE:  SUBJECTIVE STATEMENT: This is the session after chemo (last Thursday). Feeling tired. Suspect that the nausea meds I take are making me tired. Reports that when she gets chemo they monitor her elevated HR and are aware of it. Denies dizziness, chest pain, SOB.   Pt accompanied by: self  PERTINENT HISTORY: Stage IV (T1b, N2, M1 C) non-small cell lung cancer, adenocarcinoma presented with right lower lobe lung nodule in addition to small bilateral pulmonary nodules and right hilar and mediastinal lymphadenopathy in addition to liver and  bone metastasis diagnosed in April 2024. 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    PAIN:  Are you having pain? Yes: NPRS scale: no/10 Pain location: low back Pain description: sore Aggravating factors: bending, sitting wrong  Relieving factors: tylenol    PRECAUTIONS: Fall and Other: currently in treatment for lung cancer  RED FLAGS: None   WEIGHT BEARING RESTRICTIONS: No  FALLS: Has patient fallen in last 6 months? No  LIVING ENVIRONMENT: Lives with: lives alone Lives in: House/apartment Stairs: 1 step to get into home Has following equipment at home: Single point cane, Environmental consultant - 4 wheeled, Wheelchair (manual), and shower chair  PLOF: Independent with household mobility with device and Independent with community mobility with device  PATIENT GOALS: Still need to work on balance, endurance, and would like to work on cane more.  OBJECTIVE:     TODAY'S TREATMENT: 03/09/24 Activity Comments  Vitals at start of session 124/89 mmHg, 120bpm, 94%  HR recheck 122bpm   Prayer stretch with pball: forward, R/L diagonals 5x each  To tolerance   Sitting KTOS and fig 4 2x30 each   Vitals recheck 93% spO2, 113 bpm   Sitting diaphragmatic breathing Manual feedback with hands on belly. Edu on benefits of diaphragmatic breathing rather than shallow chest breathing  Overhead reach + torso twist  Cueing to adjust R arm position to mitigate R shoulder pain         PATIENT EDUCATION: Education details: edu on hydrating with water rather than coffee to address HR; advised to continue gentle HEP to address chemo-related fatigue   Person educated: Patient Education method: Explanation Education comprehension: verbalized understanding    HOME EXERCISE PROGRAM: Access Code: [email or web address] URL: [email or web address] Date: 11/18/2023 Prepared by: [facility]  [facility] -  [facility]  Program Notes practice walking inside the house with your cane for 5 minutes per day.   Exercises - Seated Hamstring Curls with Resistance  - 1 x daily - 5 x weekly - 3 sets - 10 reps - Seated Knee Extension with Resistance  - 1 x daily - 5 x weekly - 3 sets - 10 reps - Seated Hip Abduction with Resistance  - 1 x daily - 5 x weekly - 2 sets - 10 reps - Side Stepping with Counter Support  - 1 x daily - 5 x weekly - 2-3 sets - 10 reps - Alternating Step Taps with Counter Support  - 1 x daily - 5 x weekly - 3 sets - 10 reps - Backward Walking with Counter Support  - 1 x daily - 5 x weekly - 1 sets - 5 reps - Squat with Chair Touch  - 1 x daily - 5 x weekly - 2 sets - 10 reps - Corner Balance Feet Together With Eyes Open  - 1 x daily - 5 x weekly - 2 sets - 30  sec hold - Corner Balance Feet Apart: Eyes Open With Head Turns  - 1 x daily - 5 x weekly - 2 sets - 30 sec hold - Romberg Stance with Head Nods  - 1 x daily - 5 x weekly - 2 sets - 30 sec hold - Stride Stance Weight Shift  - 1-2 x daily - 7 x weekly - 1-2 sets - 10 reps - Standing Gastroc Stretch on Step  - 1 x daily - 7 x weekly - 1 sets - 3 reps - 15-30 sec hold - Seated Figure 4 Piriformis Stretch  - 1 x daily - 7 x weekly - 1 sets - 3 reps - 15 sec hold    PATIENT EDUCATION: Education details: monitoring O2 sats at home and discussing with MD Person educated: Patient Education method: Explanation Education comprehension: verbalized understanding   Note: Objective measures were completed at Evaluation unless otherwise noted.  DIAGNOSTIC FINDINGS: NA for this episode  COGNITION: Overall cognitive status: Within functional limits for tasks assessed   SENSATION: Light touch: WFL Sometimes reports pressure/heaviness in feet  MUSCLE TONE: WFL BLEs   POSTURE: rounded shoulders and forward head  LOWER EXTREMITY ROM:   ACTIVE ROM WFL  LOWER EXTREMITY MMT:    MMT Right Eval Left Eval  Hip  flexion 3+ 3+  Hip extension    Hip abduction 4 4  Hip adduction 4 4  Hip internal rotation    Hip external rotation    Knee flexion 3+ 4  Knee extension 4 4  Ankle dorsiflexion 3+ 3+  Ankle plantarflexion    Ankle inversion    Ankle eversion    (Blank rows = not tested)    TRANSFERS: Assistive device utilized: Environmental consultant - 4 wheeled  Sit to stand: Modified independence Stand to sit: Modified independence   GAIT: Gait pattern: step through pattern, decreased step length- Right, decreased stride length, and wide BOS Distance walked: 50 ft x 2 Assistive device utilized: Single point cane and Walker - 4 wheeled Level of assistance: SBA and CGA Comments: Pt brought in her cane (round tip base with SPC)  FUNCTIONAL TESTS:  5 times sit to stand: 13.63 sec arms crossed at chest Timed up and go (TUG): 17.37 with rollator; 20.91 sec with cane  10 meter walk test: 18.97 sec with rollator (1.73 ft/sec); 27.19 sec with cane (1.21 f/tsec) Lars Balance Scale: 32/56                                                                                                                                          GOALS: Goals reviewed with patient? Yes  SHORT TERM GOALS: Target date: 03/02/2024      Pt will be independent with HEP for improved strength, balance, gait. Baseline: reports some days better than others Goal status: MET 09/22/23  2.  Pt will improve 5x sit<>stand to less than  or equal to 12 sec to demonstrate improved functional strength and transfer efficiency. Baseline:  13.63 sec; 14.49 sec without UE support 09/22/23 Goal status: NOT MET 09/22/23  3.  Pt will improve Berg score to at least 38/56 to decrease fall risk. Baseline: 32/56; 49/56 09/22/23 Goal status: MET 09/22/23  4.  Pt will improve gait velocity to at least 2 ft/sec with rollator for improved gait efficiency and safety. Baseline: 1.72 ft/sec; 2.1 ft/sec 09/22/23 Goal status: MET 09/22/23  5.  Demo ability  to pick up items from ground without UE support to improve safety in home  Baseline: UE support; no support  Goal status: MET  6.  Report ability to ambulate household distances 50% of the time without AD  Baseline: 10%  Goal status: IN PROGRESS  LONG TERM GOALS: Target date: 10/10/2023>UPDATED TARGET 11/21/2023>UPDATED TARGET 03/30/2024      Pt will be independent with progression of HEP for improved strength, balance, gait. Baseline: continues to be updated Goal status: IN PROGRESS  11/20/2023  2.  Pt will improve TUG score to less than or equal to 13.5 sec for decreased fall risk. Baseline: 15.9 sec cane 11/20/2023; 14 sec w/ cane; 15 sec w/ cane, 14 sec w/out cane Goal status: IN PROGRESS 02/03/24  3.  Pt will improve Berg score to at least 45/56 to decrease fall risk. Baseline: 32/56; 49/56 09/22/23 Goal status: MET 09/22/23   5.  Pt will verbalize continued community fitness upon d/c from PT. Baseline: have provided education, endorse at home Youtube videos Goal status: IN PROGRESS 02/03/24  6. Pt will improve DGI score to at least 18/24 to decrease fall risk.  Baseline:  11/24>15/24; 16/24; 16/24  Goal status:  IN PROGRESS 02/03/24  7.  Pt will negotiate single curb step with cane and modified independent for improved community mobility.  Baseline:  CGA 11/20/2023; Supervision w/ cane; modified indep  Goal status:  Goal met  8.  Pt will improve gait velocity to at least 2.3 ft/sec for improved gait efficiency and safety.  Baseline:  2.07 ft/sec; 1.7 ft/sec w/ cane; 1.8 ft/sec w/ cane  Goal status:  IN PROGRESS, 02/03/24    9.  Pt will perform Condition 4 on MCTSIB 30 sec with mod sway or better for improved balance.    Baseline:  19 sec; moderate x 9 sec; 30 sec mild-mod    Goal status:  MET  10. to improve to at least 645' to demo MCID    Baseline: 475' 02/17/24    Goal status: INITIAL  ASSESSMENT:  CLINICAL IMPRESSION: Patient arrived to session s/p chemo infusion  on 03/04/24 with c/o increased fatigue. Vitals revealed elevated HR, higher than patient's baseline. She denies red flag symptoms. Session was much less intense d/t abnormal vitals and focused on gentle stretching, breathing techniques, and posture. Patient tolerated session well and without complaints upon leaving.   OBJECTIVE IMPAIRMENTS: Abnormal gait, decreased balance, decreased mobility, difficulty walking, and decreased strength.   ACTIVITY LIMITATIONS: bending, standing, squatting, transfers, bed mobility, and locomotion level  PARTICIPATION LIMITATIONS: driving, shopping, and community activity  PERSONAL FACTORS: 3+ comorbidities: see above are also affecting patient's functional outcome.   REHAB POTENTIAL: Good  CLINICAL DECISION MAKING: Evolving/moderate complexity  EVALUATION COMPLEXITY: Moderate  PLAN:  PT FREQUENCY: 1x/week  PT DURATION: 8 weeks   PLANNED INTERVENTIONS: 97750- Physical Performance Testing, 97110-Therapeutic exercises, 97530- Therapeutic activity, [id number]- Neuromuscular re-education, 97535- Self Care, [id number]- Manual therapy, [phone]- Gait training, Patient/Family education, and Balance  training  PLAN FOR NEXT SESSION:  continue to monitor vitals; dynamic balance and gait w/out AD, squat lift items from floor, working towards LTGs     Louana Terrilyn Christians, PT, DPT 03/09/24 10:59 AM  [facility] at [facility] [address], [address] [place], [place] [address] Phone # [phone] Fax # [phone]

## 2024-03-09 ENCOUNTER — Ambulatory Visit: Admitting: Physical Therapy

## 2024-03-09 ENCOUNTER — Encounter: Payer: Self-pay | Admitting: Physical Therapy

## 2024-03-09 DIAGNOSIS — R2689 Other abnormalities of gait and mobility: Secondary | ICD-10-CM

## 2024-03-09 DIAGNOSIS — M6281 Muscle weakness (generalized): Secondary | ICD-10-CM

## 2024-03-09 DIAGNOSIS — R29898 Other symptoms and signs involving the musculoskeletal system: Secondary | ICD-10-CM

## 2024-03-09 DIAGNOSIS — R262 Difficulty in walking, not elsewhere classified: Secondary | ICD-10-CM

## 2024-03-09 DIAGNOSIS — R2681 Unsteadiness on feet: Secondary | ICD-10-CM

## 2024-03-10 DIAGNOSIS — H2513 Age-related nuclear cataract, bilateral: Secondary | ICD-10-CM | POA: Diagnosis not present

## 2024-03-15 NOTE — Therapy (Signed)
 OUTPATIENT PHYSICAL THERAPY NEURO TREATMENT   Patient Name: Hayley Wu MRN: [id number] DOB:[birth year], 73 y.o., female Today's Date: 03/16/2024  PCP: Lanis Thresa JAYSON DEVONNA REFERRING PROVIDER: Crecencio [name], FNP     END OF SESSION:  PT End of Session - 03/16/24 1038     Visit Number 40    Number of Visits 42    Date for Recertification  03/30/24    Authorization Type [employer]    Progress Note Due on Visit 44    PT Start Time 1021    PT Stop Time 1101    PT Time Calculation (min) 40 min    Equipment Utilized During Treatment Gait belt    Activity Tolerance Patient tolerated treatment well    Behavior During Therapy WFL for tasks assessed/performed                Past Medical History:  Diagnosis Date   Allergy    Contact lens/glasses fitting    Hypertension    Past Surgical History:  Procedure Laterality Date   ABDOMINAL HYSTERECTOMY     BREAST SURGERY  2010   rt lumpectomy-neg   IR IMAGING GUIDED PORT INSERTION  12/18/2022   LAMINECTOMY N/A 09/21/2022   Procedure: THORACIC LAMINECTOMY FOR TUMOR;  Surgeon: Joshua Alm RAMAN, MD;  Location: [facility];  Service: Neurosurgery;  Laterality: N/A;   OPEN REDUCTION INTERNAL FIXATION (ORIF) DISTAL RADIAL FRACTURE Left 11/26/2013   Procedure: OPEN REDUCTION INTERNAL FIXATION (ORIF) LEFT  DISTAL RADIUS ;  Surgeon: Dempsey JINNY Sensor, MD;  Location: [facility];  Service: Orthopedics;  Laterality: Left;  with block   TUBAL LIGATION     Patient Active Problem List   Diagnosis Date Noted   Acute deep vein thrombosis (DVT) of femoral vein of both lower extremities (HCC) 03/19/2023   Normocytic anemia 03/01/2023   Moderate protein malnutrition 03/01/2023   Acute respiratory failure with hypoxia (HCC) 03/01/2023   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   Cardiomegaly 03/01/2023   Pleural effusion due to CHF (congestive heart failure) (HCC) 03/01/2023   Port-A-Cath in place 12/30/2022    Encounter for antineoplastic immunotherapy 11/18/2022   Encounter for antineoplastic chemotherapy 11/18/2022   Constipation 11/05/2022   Goals of care, counseling/discussion 10/21/2022   Cancer, metastatic to bone (HCC) 10/04/2022   Adenocarcinoma of right lung, stage 4 (HCC) 10/01/2022   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 09/20/2022   Hypokalemia 09/20/2022   Lung mass 09/20/2022   Hypothyroidism 09/20/2022   Cholelithiasis 09/20/2022   Lumbar spondylosis 08/26/2022   Thoracic back pain 04/04/2022   Low back pain 04/04/2022   Hypertension 09/23/2011    ONSET DATE: 08/08/2023 (MD order)  REFERRING DIAG:  Z91.81 (ICD-10-CM) - History of falling  C34.91 (ICD-10-CM) - Malignant neoplasm of unspecified part of right bronchus or lung    THERAPY DIAG:  Other abnormalities of gait and mobility  Unsteadiness on feet  Muscle weakness (generalized)  Other symptoms and signs involving the musculoskeletal system  Difficulty in walking, not elsewhere classified  Rationale for Evaluation and Treatment: Rehabilitation  SUBJECTIVE:  SUBJECTIVE STATEMENT: Feeling better. Went to a soccer game and used the rollator on the grass.   Pt accompanied by: self  PERTINENT HISTORY: Stage IV (T1b, N2, M1 C) non-small cell lung cancer, adenocarcinoma presented with right lower lobe lung nodule in addition to small bilateral pulmonary nodules and right hilar and mediastinal lymphadenopathy in addition to liver and bone metastasis diagnosed in April 2024. She is status post decompressive thoracic laminectomy of the T7 with spinal cord decompression and biopsy of the epidural  mass in April 2024.    PAIN:  Are you having pain? Yes: NPRS scale: no/10 Pain location: low back Pain description: sore Aggravating factors: bending, sitting wrong  Relieving factors: tylenol    PRECAUTIONS: Fall and Other: currently in treatment for lung cancer  RED FLAGS: None   WEIGHT BEARING RESTRICTIONS: No  FALLS: Has patient fallen in last 6 months? No  LIVING ENVIRONMENT: Lives with: lives alone Lives in: House/apartment Stairs: 1 step to get into home Has following equipment at home: Single point cane, Environmental consultant - 4 wheeled, Wheelchair (manual), and shower chair  PLOF: Independent with household mobility with device and Independent with community mobility with device  PATIENT GOALS: Still need to work on balance, endurance, and would like to work on cane more.  OBJECTIVE:     TODAY'S TREATMENT: 03/16/24 Activity Comments  Vitals at start of session 124/84 mmHg, 109 bpm, 87-90% spO2; pt denies SOB   walking high knee march CGA; pt reports hesitancy and demonstrates mild-mod imbalance   backwards walking CGA; cues for wider BOS  gait + head turns/nods Occasional balance checks with self-recovery with CGA  S walk around several cones Short, guarded steps with a few balance checks with CGA  Vitals check  93% spO2, 107 bpm   sidestepping on foam beam Occasional UE support on II bar         PATIENT EDUCATION: Education details: discussed recommended DC within coming visits with return after hiatus to allow more focused refinement of remaining impairments - patient in agreement  Person educated: Patient Education method: Explanation Education comprehension: verbalized understanding     HOME EXERCISE PROGRAM: Access Code: [email or web address] URL: [email or web address] Date: 11/18/2023 Prepared by: [facility] - Outpatient  Rehab - [facility]  Program Notes practice walking inside the house with your cane for 5 minutes per day.   Exercises -  Seated Hamstring Curls with Resistance  - 1 x daily - 5 x weekly - 3 sets - 10 reps - Seated Knee Extension with Resistance  - 1 x daily - 5 x weekly - 3 sets - 10 reps - Seated Hip Abduction with Resistance  - 1 x daily - 5 x weekly - 2 sets - 10 reps - Side Stepping with Counter Support  - 1 x daily - 5 x weekly - 2-3 sets - 10 reps - Alternating Step Taps with Counter Support  - 1 x daily - 5 x weekly - 3 sets - 10 reps - Backward Walking with Counter Support  - 1 x daily - 5 x weekly - 1 sets - 5 reps - Squat with Chair Touch  - 1 x daily - 5 x weekly - 2 sets - 10 reps - Corner Balance Feet Together With Eyes Open  - 1 x daily - 5 x weekly - 2 sets - 30 sec hold - Corner Balance Feet Apart: Eyes Open With Head Turns  - 1 x daily - 5 x weekly - 2 sets -  30 sec hold - Romberg Stance with Head Nods  - 1 x daily - 5 x weekly - 2 sets - 30 sec hold - Stride Stance Weight Shift  - 1-2 x daily - 7 x weekly - 1-2 sets - 10 reps - Standing Gastroc Stretch on Step  - 1 x daily - 7 x weekly - 1 sets - 3 reps - 15-30 sec hold - Seated Figure 4 Piriformis Stretch  - 1 x daily - 7 x weekly - 1 sets - 3 reps - 15 sec hold   Note: Objective measures were completed at Evaluation unless otherwise noted.  DIAGNOSTIC FINDINGS: NA for this episode  COGNITION: Overall cognitive status: Within functional limits for tasks assessed   SENSATION: Light touch: WFL Sometimes reports pressure/heaviness in feet  MUSCLE TONE: WFL BLEs   POSTURE: rounded shoulders and forward head  LOWER EXTREMITY ROM:   ACTIVE ROM WFL  LOWER EXTREMITY MMT:    MMT Right Eval Left Eval  Hip flexion 3+ 3+  Hip extension    Hip abduction 4 4  Hip adduction 4 4  Hip internal rotation    Hip external rotation    Knee flexion 3+ 4  Knee extension 4 4  Ankle dorsiflexion 3+ 3+  Ankle plantarflexion    Ankle inversion    Ankle eversion    (Blank rows = not tested)    TRANSFERS: Assistive device utilized: Environmental consultant -  4 wheeled  Sit to stand: Modified independence Stand to sit: Modified independence   GAIT: Gait pattern: step through pattern, decreased step length- Right, decreased stride length, and wide BOS Distance walked: 50 ft x 2 Assistive device utilized: Single point cane and Walker - 4 wheeled Level of assistance: SBA and CGA Comments: Pt brought in her cane (round tip base with SPC)  FUNCTIONAL TESTS:  5 times sit to stand: 13.63 sec arms crossed at chest Timed up and go (TUG): 17.37 with rollator; 20.91 sec with cane  10 meter walk test: 18.97 sec with rollator (1.73 ft/sec); 27.19 sec with cane (1.21 f/tsec) Lars Balance Scale: 32/56                                                                                                                                          GOALS: Goals reviewed with patient? Yes  SHORT TERM GOALS: Target date: 03/02/2024      Pt will be independent with HEP for improved strength, balance, gait. Baseline: reports some days better than others Goal status: MET 09/22/23  2.  Pt will improve 5x sit<>stand to less than or equal to 12 sec to demonstrate improved functional strength and transfer efficiency. Baseline:  13.63 sec; 14.49 sec without UE support 09/22/23 Goal status: NOT MET 09/22/23  3.  Pt will improve Berg score to at least 38/56 to decrease fall risk. Baseline: 32/56; 49/56 09/22/23 Goal status: MET  09/22/23  4.  Pt will improve gait velocity to at least 2 ft/sec with rollator for improved gait efficiency and safety. Baseline: 1.72 ft/sec; 2.1 ft/sec 09/22/23 Goal status: MET 09/22/23  5.  Demo ability to pick up items from ground without UE support to improve safety in home  Baseline: UE support; no support  Goal status: MET  6.  Report ability to ambulate household distances 50% of the time without AD  Baseline: 10%  Goal status: IN PROGRESS  LONG TERM GOALS: Target date: 10/10/2023>UPDATED TARGET 11/21/2023>UPDATED TARGET  03/30/2024      Pt will be independent with progression of HEP for improved strength, balance, gait. Baseline: continues to be updated Goal status: IN PROGRESS  11/20/2023  2.  Pt will improve TUG score to less than or equal to 13.5 sec for decreased fall risk. Baseline: 15.9 sec cane 11/20/2023; 14 sec w/ cane; 15 sec w/ cane, 14 sec w/out cane Goal status: IN PROGRESS 02/03/24  3.  Pt will improve Berg score to at least 45/56 to decrease fall risk. Baseline: 32/56; 49/56 09/22/23 Goal status: MET 09/22/23   5.  Pt will verbalize continued community fitness upon d/c from PT. Baseline: have provided education, endorse at home Youtube videos Goal status: IN PROGRESS 02/03/24  6. Pt will improve DGI score to at least 18/24 to decrease fall risk.  Baseline:  11/24>15/24; 16/24; 16/24  Goal status:  IN PROGRESS 02/03/24  7.  Pt will negotiate single curb step with cane and modified independent for improved community mobility.  Baseline:  CGA 11/20/2023; Supervision w/ cane; modified indep  Goal status:  Goal met  8.  Pt will improve gait velocity to at least 2.3 ft/sec for improved gait efficiency and safety.  Baseline:  2.07 ft/sec; 1.7 ft/sec w/ cane; 1.8 ft/sec w/ cane  Goal status:  IN PROGRESS, 02/03/24    9.  Pt will perform Condition 4 on MCTSIB 30 sec with mod sway or better for improved balance.    Baseline:  19 sec; moderate x 9 sec; 30 sec mild-mod    Goal status:  MET  10. to improve to at least 645' to demo MCID    Baseline: 475' 02/17/24    Goal status: INITIAL  ASSESSMENT:  CLINICAL IMPRESSION: Patient arrived to session with report of feeling better compared to last session. Vitals revealed ongoing elevated HR but improved from continued session. Proceeded with gait with balance challenges with pt reporting hesitancy and apprehension. Fairly good stability was evident despite this and patient does a great job of recovering stability with only CGA. Believe pt will be  ready for DC in coming visits and pt is in agreement with this plan.   OBJECTIVE IMPAIRMENTS: Abnormal gait, decreased balance, decreased mobility, difficulty walking, and decreased strength.   ACTIVITY LIMITATIONS: bending, standing, squatting, transfers, bed mobility, and locomotion level  PARTICIPATION LIMITATIONS: driving, shopping, and community activity  PERSONAL FACTORS: 3+ comorbidities: see above are also affecting patient's functional outcome.   REHAB POTENTIAL: Good  CLINICAL DECISION MAKING: Evolving/moderate complexity  EVALUATION COMPLEXITY: Moderate  PLAN:  PT FREQUENCY: 1x/week  PT DURATION: 8 weeks   PLANNED INTERVENTIONS: 97750- Physical Performance Testing, 97110-Therapeutic exercises, 97530- Therapeutic activity, 97112- Neuromuscular re-education, 97535- Self Care, [id number]- Manual therapy, [phone]- Gait training, Patient/Family education, and Balance training  PLAN FOR NEXT SESSION:  pick back up on discussion for plans for DC; continue to monitor vitals; dynamic balance and gait w/out AD, squat lift items from floor,  working towards [employer], PT, DPT 03/16/24 11:02 AM  [facility] at [facility] [address] [place], [address] [place], [place] [address] Phone # [phone] Fax # [phone]

## 2024-03-16 ENCOUNTER — Ambulatory Visit: Attending: Family Medicine | Admitting: Physical Therapy

## 2024-03-16 ENCOUNTER — Encounter: Payer: Self-pay | Admitting: Physical Therapy

## 2024-03-16 DIAGNOSIS — R2689 Other abnormalities of gait and mobility: Secondary | ICD-10-CM | POA: Insufficient documentation

## 2024-03-16 DIAGNOSIS — M6281 Muscle weakness (generalized): Secondary | ICD-10-CM | POA: Insufficient documentation

## 2024-03-16 DIAGNOSIS — R262 Difficulty in walking, not elsewhere classified: Secondary | ICD-10-CM | POA: Insufficient documentation

## 2024-03-16 DIAGNOSIS — R2681 Unsteadiness on feet: Secondary | ICD-10-CM | POA: Diagnosis not present

## 2024-03-16 DIAGNOSIS — R29898 Other symptoms and signs involving the musculoskeletal system: Secondary | ICD-10-CM | POA: Insufficient documentation

## 2024-03-17 NOTE — Progress Notes (Signed)
 [facility] OFFICE PROGRESS NOTE  Stamey, [name], FNP [address] [place] [address]  DIAGNOSIS: Stage IV (T1b, N2, M1 C) non-small cell lung cancer, adenocarcinoma presented with right lower lobe lung nodule in addition to small bilateral pulmonary nodules and right hilar and mediastinal lymphadenopathy in addition to liver and bone metastasis diagnosed in April 2024. 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   PDL1: 20%   Guardant 360: no actionable mutations.  PRIOR THERAPY: 1) Decompressive thoracic laminectomy of the T7 with spinal cord decompression and biopsy of the epidural mass in April 2024.  2) Palliative radiation to the bone under the care of Dr. Izell. Last day scheduled for 10/25/22   CURRENT THERAPY: Carboplatin  for an AUC of 5, Alimta  500 mg/m, Keytruda  200 mg IV every 3 weeks.  First dose expected on 10/28/2022. Status post 22 cycles.  Starting from cycle #5 the patient is on maintenance treatment with Alimta  and Keytruda  every 3 weeks.  Starting from cycle #7 the patient will be treated with single agent Alimta  500 Mg/M2 every 3 weeks.  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   INTERVAL HISTORY: Hayley Wu 73 y.o. female returns to the clinic today for a follow-up visit. She was last seen by myself 3 weeks ago. She is currently on single agent Alimta . Keytruda  was discontinued due to suspicious immunotherapy mediated pneumonitis after a hospitalization in September 2024.  She is feeling fairly well today without any concerning complaints.  She is engaging in physical therapy a few times a week.  Sometimes her oxygen  saturation drops with exertion but recovers after resting.  We discussed performing a 6-minute walking test today which she declined.  She also has a known large pleural effusion that  was seen on her August CT scan.  However the patient was not very symptomatic and she opted to try and avoid a thoracentesis if needed.  Since her oxygen  saturations drop with exertion she would be open to draining the fluid at this time.  She also has some ongoing tachycardia.  She is asymptomatic.    She does experience decreased appetite and weight loss.  She is already on 30 mg of Remeron  at nighttime.  She also has some generalized anxiety.  She has taste alterations related to her treatment without any evidence of thrush.  We previously talked about protein supplemental drinks and small frequent meals.  She is compliant with salt water rinses and Biotene which is ineffective.  Otherwise she denies any fever, chills, or night sweats.  Denies any changes in her breathing or worsening cough, chest pain, or hemoptysis.  Denies any unusual nausea, vomiting, diarrhea, or constipation.  Denies any headache or visual changes.  Denies any rashes or skin changes.  She is here today for evaluation and repeat blood work before undergoing cycle #32.    MEDICAL HISTORY: Past Medical History:  Diagnosis Date   Allergy    Contact lens/glasses fitting    Hypertension     ALLERGIES:  is allergic to erythromycin, amoxicillin, latex, and prednisone .  MEDICATIONS:  Current Outpatient Medications  Medication Sig Dispense Refill   acetaminophen  (TYLENOL ) 500 MG tablet Take 500 mg by mouth every 6 (six) hours as needed for moderate pain.     amLODipine  (NORVASC ) 10 MG tablet Take 10 mg by mouth daily.  apixaban  (ELIQUIS ) 5 MG TABS tablet Take 1 tablet (5 mg total) by mouth 2 (two) times daily. 60 tablet 2   folic acid  (FOLVITE ) 1 MG tablet Take 1 tablet (1 mg total) by mouth daily. 90 tablet 0   Ipratropium-Albuterol  (COMBIVENT ) 20-100 MCG/ACT AERS respimat Inhale 1 puff into the lungs every 6 (six) hours as needed for wheezing or shortness of breath. 4 g 1   levothyroxine  (SYNTHROID ) 150 MCG tablet Take  150 mcg by mouth daily before breakfast.     lidocaine -prilocaine  (EMLA ) cream Apply 1 Application topically as needed. 30 g 2   mirtazapine  (REMERON ) 30 MG tablet TAKE 1 TABLET BY MOUTH AT BEDTIME 90 tablet 0   potassium chloride  SA (KLOR-CON  M) 20 MEQ tablet Take 1 tablet (20 mEq total) by mouth daily. (Patient not taking: Reported on 01/01/2024) 6 tablet 0   prochlorperazine  (COMPAZINE ) 10 MG tablet Take 1 tablet (10 mg total) by mouth every 6 (six) hours as needed. 30 tablet 2   VITAMIN D PO Take 1 tablet by mouth daily.     No current facility-administered medications for this visit.    SURGICAL HISTORY:  Past Surgical History:  Procedure Laterality Date   ABDOMINAL HYSTERECTOMY     BREAST SURGERY  2010   rt lumpectomy-neg   IR IMAGING GUIDED PORT INSERTION  12/18/2022   LAMINECTOMY N/A 09/21/2022   Procedure: THORACIC LAMINECTOMY FOR TUMOR;  Surgeon: Joshua Alm RAMAN, MD;  Location: [facility] OR;  Service: Neurosurgery;  Laterality: N/A;   OPEN REDUCTION INTERNAL FIXATION (ORIF) DISTAL RADIAL FRACTURE Left 11/26/2013   Procedure: OPEN REDUCTION INTERNAL FIXATION (ORIF) LEFT  DISTAL RADIUS ;  Surgeon: Dempsey JINNY Sensor, MD;  Location: [facility];  Service: Orthopedics;  Laterality: Left;  with block   TUBAL LIGATION      REVIEW OF SYSTEMS:   Constitutional: Positive for stable fatigue, weight loss, and stable diminished appetite. Negative for chills and fever. HENT: Positive for occasional nasal congestion. Negative for mouth sores, nosebleeds, sore throat and trouble swallowing.   Eyes: Negative for eye problems and icterus.  Respiratory: Positive for stable dyspnea on exertion. Negative for cough, hemoptysis, and wheezing.     Cardiovascular: Negative for chest pain and swelling.  Gastrointestinal: Negative for abdominal pain, nausea, constipation, diarrhea, and vomiting.  Genitourinary: Negative for bladder incontinence, difficulty urinating, dysuria, frequency and hematuria.    Musculoskeletal: Negative for back pain, gait problem, neck pain and neck stiffness.  Skin: Negative for itching and rash.  Neurological: Negative for dizziness, extremity weakness, gait problem, headaches, light-headedness and seizures.  Hematological: Negative for adenopathy. Does not bruise/bleed easily.  Psychiatric/Behavioral: Negative for confusion, depression and sleep disturbance. The patient is not nervous/anxious.   PHYSICAL EXAMINATION:  There were no vitals taken for this visit.  ECOG PERFORMANCE STATUS: 1  Physical Exam  Constitutional: Oriented to person, place, and time and well-developed, well-nourished, and in no distress. HENT:  Head: Normocephalic and atraumatic.  Mouth/Throat: Oropharynx is clear and moist. No oropharyngeal exudate.  Eyes: Conjunctivae are normal. Right eye exhibits no discharge. Left eye exhibits no discharge. No scleral icterus.  Neck: Normal range of motion. Neck supple.  Cardiovascular: Normal rate, regular rhythm, normal heart sounds and intact distal pulses.   Pulmonary/Chest: Effort normal. Decreased breath sounds in right lung. No respiratory distress. No wheezes. No rales.  Abdominal: Soft. Bowel sounds are normal. Exhibits no distension and no mass. There is no tenderness.  Musculoskeletal: Normal range of motion.  Lymphadenopathy:  No cervical adenopathy.  Neurological: Alert and oriented to person, place, and time. Exhibits muscle wasting. Ambulates with a walker.  Skin: Skin is warm and dry. No rash noted. Not diaphoretic. No erythema. No pallor.  Psychiatric: Mood, memory and judgment normal.  Vitals reviewed.  LABORATORY DATA: Lab Results  Component Value Date   WBC 6.0 03/04/2024   HGB 13.7 03/04/2024   HCT 41.8 03/04/2024   MCV 88.4 03/04/2024   PLT 251 03/04/2024      Chemistry      Component Value Date/Time   NA 141 03/04/2024 1408   K 3.7 03/04/2024 1408   CL 105 03/04/2024 1408   CO2 29 03/04/2024 1408   BUN  10 03/04/2024 1408   CREATININE 0.73 03/04/2024 1408      Component Value Date/Time   CALCIUM 9.3 03/04/2024 1408   ALKPHOS 88 03/04/2024 1408   AST 17 03/04/2024 1408   ALT 11 03/04/2024 1408   BILITOT 0.5 03/04/2024 1408       RADIOGRAPHIC STUDIES:  No results found.   ASSESSMENT/PLAN:  This is a very pleasant 73 year old Caucasian female recently diagnosed with stage IV (T1b, N2, M1 C) non-small cell lung cancer, adenocarcinoma presented with right lower lobe lung nodule in addition to small bilateral pulmonary nodules and right hilar and mediastinal lymphadenopathy in addition to liver and bone metastasis diagnosed in April 2024. 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 Her PDL1 expression is 20%. She has no actionable mutations.    She is completed palliative radiation to the painful metastatic bone lesions under the care of Dr. Izell in the last day radiation was on 10/25/2022.    She is currently undergoing systemic chemotherapy with carboplatin  for an AUC of 5, Alimta  500 mg/m, and Keytruda  200 mg IV every 3 weeks. She is status post 22 cycles.  Starting from cycle #5 she was on maintenance treatment with Alimta  and Keytruda .  Starting after cycle #6 Keytruda  was discontinued from the care plan due to suspicious immunotherapy mediated pneumonitis.  Therefore, she is currently undergoing single agent chemotherapy with Alimta  IV every 3 weeks.   Labs were reviewed. Her CMP is pending. As long as this is in parameters, recommend she proceed with cycle #23 today as scheduled. She is ok to treat with her pulse of 108 bmp but we will check EKG since she has been having ongoing tachycardia to ensure no arrhythmia.    We will see her back for follow-up visit in 3 weeks for evaluation and repeat blood work before undergoing cycle #24    She has standing orders for TSH and T4.   Will  arrange for restaging CT of the chest, abdomen, pelvis prior to her next cycle of treatment.  We reviewed her pulse and oxygen  saturation during her physical therapy appointments.  I strongly encouraged the patient to consider a thoracentesis.  She is agreeable.  I have placed an order.   I offered a 6-minute walking test to see if the patient qualifies for supplemental oxygen .  She declined at this time as her oxygen  saturation improves quickly upon resting.  Encourage use of protein drinks and small frequent meals. Discussed salt water rinses and biotene.  She is already on 30 mg of Remeron .  I encouraged her to follow-up with her PCP to see what their medication of choice and preferences regarding anxiety management.  We will see her back for labs and a follow-up visit and to review her CT scan in 3 weeks prior to undergoing cycle #35.  The patient was advised to call immediately if she has any concerning symptoms in the interval. The patient voices understanding of current disease status and treatment options and is in agreement with the current care plan. All questions were answered. The patient knows to call the clinic with any problems, questions or concerns. We can certainly see the patient much sooner if necessary    No orders of the defined types were placed in this encounter.    The total time spent in the appointment was 20-29 minutes  Janda Cargo L Anacarolina Evelyn, PA-C 03/17/24

## 2024-03-19 ENCOUNTER — Other Ambulatory Visit: Payer: Self-pay

## 2024-03-19 DIAGNOSIS — N390 Urinary tract infection, site not specified: Secondary | ICD-10-CM | POA: Diagnosis not present

## 2024-03-19 DIAGNOSIS — Z6828 Body mass index (BMI) 28.0-28.9, adult: Secondary | ICD-10-CM | POA: Diagnosis not present

## 2024-03-23 ENCOUNTER — Ambulatory Visit: Admitting: Physical Therapy

## 2024-03-23 ENCOUNTER — Encounter: Payer: Self-pay | Admitting: Physical Therapy

## 2024-03-23 DIAGNOSIS — R2689 Other abnormalities of gait and mobility: Secondary | ICD-10-CM | POA: Diagnosis not present

## 2024-03-23 DIAGNOSIS — R2681 Unsteadiness on feet: Secondary | ICD-10-CM

## 2024-03-23 DIAGNOSIS — M6281 Muscle weakness (generalized): Secondary | ICD-10-CM

## 2024-03-23 NOTE — Therapy (Signed)
 OUTPATIENT PHYSICAL THERAPY NEURO TREATMENT   Patient Name: Hayley Wu MRN: [id number] DOB:Nov 15, 1950, 73 y.o., female Today's Date: 03/23/2024  PCP: Lanis Thresa JAYSON DEVONNA REFERRING PROVIDER: Crecencio [name], FNP     END OF SESSION:  PT End of Session - 03/23/24 1019     Visit Number 41    Number of Visits 42    Date for Recertification  03/30/24    Authorization Type [employer]    Progress Note Due on Visit 44    PT Start Time 1020    PT Stop Time 1059    PT Time Calculation (min) 39 min    Equipment Utilized During Treatment Gait belt    Activity Tolerance Patient tolerated treatment well    Behavior During Therapy WFL for tasks assessed/performed                 Past Medical History:  Diagnosis Date   Allergy    Contact lens/glasses fitting    Hypertension    Past Surgical History:  Procedure Laterality Date   ABDOMINAL HYSTERECTOMY     BREAST SURGERY  2010   rt lumpectomy-neg   IR IMAGING GUIDED PORT INSERTION  12/18/2022   LAMINECTOMY N/A 09/21/2022   Procedure: THORACIC LAMINECTOMY FOR TUMOR;  Surgeon: Joshua Alm RAMAN, MD;  Location: [facility] OR;  Service: Neurosurgery;  Laterality: N/A;   OPEN REDUCTION INTERNAL FIXATION (ORIF) DISTAL RADIAL FRACTURE Left 11/26/2013   Procedure: OPEN REDUCTION INTERNAL FIXATION (ORIF) LEFT  DISTAL RADIUS ;  Surgeon: Dempsey JINNY Sensor, MD;  Location: [facility];  Service: Orthopedics;  Laterality: Left;  with block   TUBAL LIGATION     Patient Active Problem List   Diagnosis Date Noted   Acute deep vein thrombosis (DVT) of femoral vein of both lower extremities (HCC) 03/19/2023   Normocytic anemia 03/01/2023   Moderate protein malnutrition 03/01/2023   Acute respiratory failure with hypoxia (HCC) 03/01/2023   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   Cardiomegaly 03/01/2023   Pleural effusion due to CHF (congestive heart failure) (HCC) 03/01/2023   Port-A-Cath in place 12/30/2022    Encounter for antineoplastic immunotherapy 11/18/2022   Encounter for antineoplastic chemotherapy 11/18/2022   Constipation 11/05/2022   Goals of care, counseling/discussion 10/21/2022   Cancer, metastatic to bone (HCC) 10/04/2022   Adenocarcinoma of right lung, stage 4 (HCC) 10/01/2022   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 09/20/2022   Hypokalemia 09/20/2022   Lung mass 09/20/2022   Hypothyroidism 09/20/2022   Cholelithiasis 09/20/2022   Lumbar spondylosis 08/26/2022   Thoracic back pain 04/04/2022   Low back pain 04/04/2022   Hypertension 09/23/2011    ONSET DATE: 08/08/2023 (MD order)  REFERRING DIAG:  Z91.81 (ICD-10-CM) - History of falling  C34.91 (ICD-10-CM) - Malignant neoplasm of unspecified part of right bronchus or lung    THERAPY DIAG:  Other abnormalities of gait and mobility  Unsteadiness on feet  Muscle weakness (generalized)  Rationale for Evaluation and Treatment: Rehabilitation  SUBJECTIVE:  SUBJECTIVE STATEMENT: Tired today.  Did have a UTI and have antibiotics.  Will have chemo tomorrow.    Pt accompanied by: self  PERTINENT HISTORY: Stage IV (T1b, N2, M1 C) non-small cell lung cancer, adenocarcinoma presented with right lower lobe lung nodule in addition to small bilateral pulmonary nodules and right hilar and mediastinal lymphadenopathy in addition to liver and bone metastasis diagnosed in April 2024. 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    PAIN:  Are you having pain? Yes: NPRS scale: no/10 Pain location: low back Pain  description: sore Aggravating factors: bending, sitting wrong  Relieving factors: tylenol    PRECAUTIONS: Fall and Other: currently in treatment for lung cancer  RED FLAGS: None   WEIGHT BEARING RESTRICTIONS: No  FALLS: Has patient fallen in last 6 months? No  LIVING ENVIRONMENT: Lives with: lives alone Lives in: House/apartment Stairs: 1 step to get into home Has following equipment at home: Single point cane, Environmental consultant - 4 wheeled, Wheelchair (manual), and shower chair  PLOF: Independent with household mobility with device and Independent with community mobility with device  PATIENT GOALS: Still need to work on balance, endurance, and would like to work on cane more.  OBJECTIVE:    TODAY'S TREATMENT: 03/23/2024 Activity Comments  Vitals at start of session 90-92%, HR 112>107 bpm; 122/80  Forwards/back walking 20 ft x 2 reps HR 116 bpm, O2 sats 84-88>90%  Gait with rollator, 2:30, 85 ft x 3 reps    Gait with cane, no cane around obstacles 83-90% O2 sats  Gait with cane + head turns to look at cards   Gait 85 ft x 2, with rollator 81>92% O2 sats       PATIENT EDUCATION: Education details: Sent O2 sats info with patient so she can discuss with MD at chemo infusion tomorrow.  Discussed recommended DC within next visit with return after hiatus to allow more focused refinement of remaining impairments - patient in agreement  Person educated: Patient Education method: Explanation Education comprehension: verbalized understanding     HOME EXERCISE PROGRAM: Access Code: [email or web address] URL: [email or web address] Date: 11/18/2023 Prepared by: [facility] - Outpatient  Rehab - [facility]  Program Notes practice walking inside the house with your cane for 5 minutes per day.   Exercises - Seated Hamstring Curls with Resistance  - 1 x daily - 5 x weekly - 3 sets - 10 reps - Seated Knee Extension with Resistance  - 1 x daily - 5 x weekly - 3 sets - 10 reps -  Seated Hip Abduction with Resistance  - 1 x daily - 5 x weekly - 2 sets - 10 reps - Side Stepping with Counter Support  - 1 x daily - 5 x weekly - 2-3 sets - 10 reps - Alternating Step Taps with Counter Support  - 1 x daily - 5 x weekly - 3 sets - 10 reps - Backward Walking with Counter Support  - 1 x daily - 5 x weekly - 1 sets - 5 reps - Squat with Chair Touch  - 1 x daily - 5 x weekly - 2 sets - 10 reps - Corner Balance Feet Together With Eyes Open  - 1 x daily - 5 x weekly - 2 sets - 30 sec hold - Corner Balance Feet Apart: Eyes Open With Head Turns  - 1 x daily - 5 x weekly - 2 sets -  30 sec hold - Romberg Stance with Head Nods  - 1 x daily - 5 x weekly - 2 sets - 30 sec hold - Stride Stance Weight Shift  - 1-2 x daily - 7 x weekly - 1-2 sets - 10 reps - Standing Gastroc Stretch on Step  - 1 x daily - 7 x weekly - 1 sets - 3 reps - 15-30 sec hold - Seated Figure 4 Piriformis Stretch  - 1 x daily - 7 x weekly - 1 sets - 3 reps - 15 sec hold   Note: Objective measures were completed at Evaluation unless otherwise noted.  DIAGNOSTIC FINDINGS: NA for this episode  COGNITION: Overall cognitive status: Within functional limits for tasks assessed   SENSATION: Light touch: WFL Sometimes reports pressure/heaviness in feet  MUSCLE TONE: WFL BLEs   POSTURE: rounded shoulders and forward head  LOWER EXTREMITY ROM:   ACTIVE ROM WFL  LOWER EXTREMITY MMT:    MMT Right Eval Left Eval  Hip flexion 3+ 3+  Hip extension    Hip abduction 4 4  Hip adduction 4 4  Hip internal rotation    Hip external rotation    Knee flexion 3+ 4  Knee extension 4 4  Ankle dorsiflexion 3+ 3+  Ankle plantarflexion    Ankle inversion    Ankle eversion    (Blank rows = not tested)    TRANSFERS: Assistive device utilized: Environmental consultant - 4 wheeled  Sit to stand: Modified independence Stand to sit: Modified independence   GAIT: Gait pattern: step through pattern, decreased step length- Right, decreased  stride length, and wide BOS Distance walked: 50 ft x 2 Assistive device utilized: Single point cane and Walker - 4 wheeled Level of assistance: SBA and CGA Comments: Pt brought in her cane (round tip base with SPC)  FUNCTIONAL TESTS:  5 times sit to stand: 13.63 sec arms crossed at chest Timed up and go (TUG): 17.37 with rollator; 20.91 sec with cane  10 meter walk test: 18.97 sec with rollator (1.73 ft/sec); 27.19 sec with cane (1.21 f/tsec) Lars Balance Scale: 32/56                                                                                                                                          GOALS: Goals reviewed with patient? Yes  SHORT TERM GOALS: Target date: 03/02/2024      Pt will be independent with HEP for improved strength, balance, gait. Baseline: reports some days better than others Goal status: MET 09/22/23  2.  Pt will improve 5x sit<>stand to less than or equal to 12 sec to demonstrate improved functional strength and transfer efficiency. Baseline:  13.63 sec; 14.49 sec without UE support 09/22/23 Goal status: NOT MET 09/22/23  3.  Pt will improve Berg score to at least 38/56 to decrease fall risk. Baseline: 32/56; 49/56 09/22/23 Goal status: MET  09/22/23  4.  Pt will improve gait velocity to at least 2 ft/sec with rollator for improved gait efficiency and safety. Baseline: 1.72 ft/sec; 2.1 ft/sec 09/22/23 Goal status: MET 09/22/23  5.  Demo ability to pick up items from ground without UE support to improve safety in home  Baseline: UE support; no support  Goal status: MET  6.  Report ability to ambulate household distances 50% of the time without AD  Baseline: 10%  Goal status: IN PROGRESS  LONG TERM GOALS: Target date: 10/10/2023>UPDATED TARGET 11/21/2023>UPDATED TARGET 03/30/2024      Pt will be independent with progression of HEP for improved strength, balance, gait. Baseline: continues to be updated Goal status: IN PROGRESS   11/20/2023  2.  Pt will improve TUG score to less than or equal to 13.5 sec for decreased fall risk. Baseline: 15.9 sec cane 11/20/2023; 14 sec w/ cane; 15 sec w/ cane, 14 sec w/out cane Goal status: IN PROGRESS 02/03/24  3.  Pt will improve Berg score to at least 45/56 to decrease fall risk. Baseline: 32/56; 49/56 09/22/23 Goal status: MET 09/22/23   5.  Pt will verbalize continued community fitness upon d/c from PT. Baseline: have provided education, endorse at home Youtube videos Goal status: IN PROGRESS 02/03/24  6. Pt will improve DGI score to at least 18/24 to decrease fall risk.  Baseline:  11/24>15/24; 16/24; 16/24  Goal status:  IN PROGRESS 02/03/24  7.  Pt will negotiate single curb step with cane and modified independent for improved community mobility.  Baseline:  CGA 11/20/2023; Supervision w/ cane; modified indep  Goal status:  Goal met  8.  Pt will improve gait velocity to at least 2.3 ft/sec for improved gait efficiency and safety.  Baseline:  2.07 ft/sec; 1.7 ft/sec w/ cane; 1.8 ft/sec w/ cane  Goal status:  IN PROGRESS, 02/03/24    9.  Pt will perform Condition 4 on MCTSIB 30 sec with mod sway or better for improved balance.    Baseline:  19 sec; moderate x 9 sec; 30 sec mild-mod    Goal status:  MET  10. to improve to at least 645' to demo MCID    Baseline: 475' 02/17/24    Goal status: INITIAL  ASSESSMENT:  CLINICAL IMPRESSION: Pt presents today with reports of fatigue and tiredness; continues with elevated HR and monitored today throughout session.  Also, with vitals check, pt noted to have decreased O2 sats (83-84% after short bouts of activity, then rebounding to 90%).  Advised pt to follow up with MD at chemo session tomorrow so they are aware.  Skilled PT session focused on educating ways she can incorporate activity and exercise into her routine, likely using 4WW for energy conservation.  Will continue with plan for discharge next visit.  OBJECTIVE  IMPAIRMENTS: Abnormal gait, decreased balance, decreased mobility, difficulty walking, and decreased strength.   ACTIVITY LIMITATIONS: bending, standing, squatting, transfers, bed mobility, and locomotion level  PARTICIPATION LIMITATIONS: driving, shopping, and community activity  PERSONAL FACTORS: 3+ comorbidities: see above are also affecting patient's functional outcome.   REHAB POTENTIAL: Good  CLINICAL DECISION MAKING: Evolving/moderate complexity  EVALUATION COMPLEXITY: Moderate  PLAN:  PT FREQUENCY: 1x/week  PT DURATION: 8 weeks   PLANNED INTERVENTIONS: 97750- Physical Performance Testing, 97110-Therapeutic exercises, 97530- Therapeutic activity, [id number]- Neuromuscular re-education, 97535- Self Care, [id number]- Manual therapy, [id number]- Gait training, Patient/Family education, and Balance training  PLAN FOR NEXT SESSION:  Check LTGs and plan for dishcarge next visit.  Greig Anon, PT 03/23/24 12:00 PM Phone: [phone] Fax: [phone]  [facility] at [facility] [address], [address] [place], [place] [address] Phone # [phone] Fax # [phone]

## 2024-03-23 NOTE — Patient Instructions (Addendum)
 Baseline:  HR 116 bpm, O2 sats 84>90% after walking into session 03/23/2024  With gait training activities with cane in PT session:  HR 122 bpm, O2 sats 83>88%, up to 90% after 1-2 minutes  Gait training with 4 wheeled walker:   HR 122 bpm, O2 sats 81>85>92% after 2 minutes  No SOB, no chest pain

## 2024-03-24 ENCOUNTER — Inpatient Hospital Stay

## 2024-03-24 ENCOUNTER — Inpatient Hospital Stay: Attending: Internal Medicine

## 2024-03-24 ENCOUNTER — Inpatient Hospital Stay: Admitting: Physician Assistant

## 2024-03-24 VITALS — BP 125/86 | HR 108 | Temp 98.0°F | Resp 18 | Ht 63.0 in | Wt 155.3 lb

## 2024-03-24 VITALS — BP 111/68 | HR 102 | Temp 98.2°F | Resp 18

## 2024-03-24 DIAGNOSIS — C3491 Malignant neoplasm of unspecified part of right bronchus or lung: Secondary | ICD-10-CM

## 2024-03-24 DIAGNOSIS — C787 Secondary malignant neoplasm of liver and intrahepatic bile duct: Secondary | ICD-10-CM | POA: Insufficient documentation

## 2024-03-24 DIAGNOSIS — Z5111 Encounter for antineoplastic chemotherapy: Secondary | ICD-10-CM | POA: Insufficient documentation

## 2024-03-24 DIAGNOSIS — Z79899 Other long term (current) drug therapy: Secondary | ICD-10-CM | POA: Diagnosis not present

## 2024-03-24 DIAGNOSIS — J90 Pleural effusion, not elsewhere classified: Secondary | ICD-10-CM | POA: Insufficient documentation

## 2024-03-24 DIAGNOSIS — E039 Hypothyroidism, unspecified: Secondary | ICD-10-CM

## 2024-03-24 DIAGNOSIS — C3431 Malignant neoplasm of lower lobe, right bronchus or lung: Secondary | ICD-10-CM | POA: Diagnosis not present

## 2024-03-24 DIAGNOSIS — C7951 Secondary malignant neoplasm of bone: Secondary | ICD-10-CM | POA: Diagnosis not present

## 2024-03-24 DIAGNOSIS — R000 Tachycardia, unspecified: Secondary | ICD-10-CM | POA: Diagnosis not present

## 2024-03-24 LAB — CBC WITH DIFFERENTIAL (CANCER CENTER ONLY)
Abs Immature Granulocytes: 0.01 K/uL (ref 0.00–0.07)
Basophils Absolute: 0 K/uL (ref 0.0–0.1)
Basophils Relative: 1 %
Eosinophils Absolute: 0.1 K/uL (ref 0.0–0.5)
Eosinophils Relative: 1 %
HCT: 41 % (ref 36.0–46.0)
Hemoglobin: 13.2 g/dL (ref 12.0–15.0)
Immature Granulocytes: 0 %
Lymphocytes Relative: 12 %
Lymphs Abs: 0.7 K/uL (ref 0.7–4.0)
MCH: 28.7 pg (ref 26.0–34.0)
MCHC: 32.2 g/dL (ref 30.0–36.0)
MCV: 89.1 fL (ref 80.0–100.0)
Monocytes Absolute: 0.5 K/uL (ref 0.1–1.0)
Monocytes Relative: 8 %
Neutro Abs: 4.3 K/uL (ref 1.7–7.7)
Neutrophils Relative %: 78 %
Platelet Count: 250 K/uL (ref 150–400)
RBC: 4.6 MIL/uL (ref 3.87–5.11)
RDW: 14.5 % (ref 11.5–15.5)
WBC Count: 5.5 K/uL (ref 4.0–10.5)
nRBC: 0 % (ref 0.0–0.2)

## 2024-03-24 LAB — CMP (CANCER CENTER ONLY)
ALT: 14 U/L (ref 0–44)
AST: 18 U/L (ref 15–41)
Albumin: 4.2 g/dL (ref 3.5–5.0)
Alkaline Phosphatase: 87 U/L (ref 38–126)
Anion gap: 6 (ref 5–15)
BUN: 11 mg/dL (ref 8–23)
CO2: 28 mmol/L (ref 22–32)
Calcium: 9.9 mg/dL (ref 8.9–10.3)
Chloride: 104 mmol/L (ref 98–111)
Creatinine: 0.72 mg/dL (ref 0.44–1.00)
GFR, Estimated: >60 mL/min (ref >60)
Glucose, Bld: 120 mg/dL — ABNORMAL HIGH (ref 70–99)
Potassium: 3.9 mmol/L (ref 3.5–5.1)
Sodium: 138 mmol/L (ref 135–145)
Total Bilirubin: 0.4 mg/dL (ref 0.0–1.2)
Total Protein: 6.9 g/dL (ref 6.5–8.1)

## 2024-03-24 LAB — TSH: TSH: 1.41 u[IU]/mL (ref 0.350–4.500)

## 2024-03-24 LAB — T4, FREE: Free T4: 1.67 ng/dL — ABNORMAL HIGH (ref 0.61–1.12)

## 2024-03-24 MED ORDER — DEXAMETHASONE SOD PHOSPHATE PF 10 MG/ML IJ SOLN
10.0000 mg | Freq: Once | INTRAMUSCULAR | Status: AC
  Administered 2024-03-24: 10 mg via INTRAVENOUS

## 2024-03-24 MED ORDER — SODIUM CHLORIDE 0.9 % IV SOLN: Freq: Once | INTRAVENOUS | Status: AC

## 2024-03-24 MED ORDER — SODIUM CHLORIDE 0.9 % IV SOLN
500.0000 mg/m2 | Freq: Once | INTRAVENOUS | Status: AC
  Administered 2024-03-24: 900 mg via INTRAVENOUS
  Filled 2024-03-24: qty 20

## 2024-03-24 MED ORDER — PROCHLORPERAZINE MALEATE 10 MG PO TABS
10.0000 mg | ORAL_TABLET | Freq: Once | ORAL | Status: AC
  Administered 2024-03-24: 10 mg via ORAL
  Filled 2024-03-24: qty 1

## 2024-03-24 NOTE — Patient Instructions (Signed)
 [facility] - A DEPT OF [facility]  Discharge Instructions: Thank you for choosing [facility] to provide your oncology and hematology care.   If you have a lab appointment with the Cancer Center, please go directly to the Cancer Center and check in at the registration area.   Wear comfortable clothing and clothing appropriate for easy access to any Portacath or PICC line.   We strive to give you quality time with your provider. You may need to reschedule your appointment if you arrive late (15 or more minutes).  Arriving late affects you and other patients whose appointments are after yours.  Also, if you miss three or more appointments without notifying the office, you may be dismissed from the clinic at the provider's discretion.      For prescription refill requests, have your pharmacy contact our office and allow 72 hours for refills to be completed.    Today you received the following chemotherapy and/or immunotherapy agents alimta      To help prevent nausea and vomiting after your treatment, we encourage you to take your nausea medication as directed.  BELOW ARE SYMPTOMS THAT SHOULD BE REPORTED IMMEDIATELY: *FEVER GREATER THAN 100.4 F (38 C) OR HIGHER *CHILLS OR SWEATING *NAUSEA AND VOMITING THAT IS NOT CONTROLLED WITH YOUR NAUSEA MEDICATION *UNUSUAL SHORTNESS OF BREATH *UNUSUAL BRUISING OR BLEEDING *URINARY PROBLEMS (pain or burning when urinating, or frequent urination) *BOWEL PROBLEMS (unusual diarrhea, constipation, pain near the anus) TENDERNESS IN MOUTH AND THROAT WITH OR WITHOUT PRESENCE OF ULCERS (sore throat, sores in mouth, or a toothache) UNUSUAL RASH, SWELLING OR PAIN  UNUSUAL VAGINAL DISCHARGE OR ITCHING   Items with * indicate a potential emergency and should be followed up as soon as possible or go to the Emergency Department if any problems should occur.  Please show the CHEMOTHERAPY ALERT CARD or IMMUNOTHERAPY  ALERT CARD at check-in to the Emergency Department and triage nurse.  Should you have questions after your visit or need to cancel or reschedule your appointment, please contact [facility] - A DEPT OF [facility]  Dept: [phone]  and follow the prompts.  Office hours are 8:00 a.m. to 4:30 p.m. Monday - Friday. Please note that voicemails left after 4:00 p.m. may not be returned until the following business day.  We are closed weekends and major holidays. You have access to a nurse at all times for urgent questions. Please call the main number to the clinic Dept: [phone] and follow the prompts.   For any non-urgent questions, you may also contact your provider using MyChart. We now offer e-Visits for anyone 50 and older to request care online for non-urgent symptoms. For details visit [email or web address].   Also download the MyChart app! Go to the app store, search "MyChart", open the app, select [place], and log in with your MyChart username and password.

## 2024-03-29 ENCOUNTER — Ambulatory Visit (HOSPITAL_COMMUNITY)
Admission: RE | Admit: 2024-03-29 | Discharge: 2024-03-29 | Disposition: A | Discharge status: Home / Self Care | Source: Ambulatory Visit | Attending: Radiology | Admitting: Radiology

## 2024-03-29 ENCOUNTER — Ambulatory Visit (HOSPITAL_COMMUNITY)
Admission: RE | Admit: 2024-03-29 | Discharge: 2024-03-29 | Disposition: A | Discharge status: Home / Self Care | Source: Ambulatory Visit | Attending: Physician Assistant | Admitting: Physician Assistant

## 2024-03-29 DIAGNOSIS — J90 Pleural effusion, not elsewhere classified: Secondary | ICD-10-CM | POA: Diagnosis not present

## 2024-03-29 DIAGNOSIS — I517 Cardiomegaly: Secondary | ICD-10-CM | POA: Diagnosis not present

## 2024-03-29 DIAGNOSIS — C3491 Malignant neoplasm of unspecified part of right bronchus or lung: Secondary | ICD-10-CM | POA: Diagnosis not present

## 2024-03-29 DIAGNOSIS — R0609 Other forms of dyspnea: Secondary | ICD-10-CM | POA: Diagnosis present

## 2024-03-29 DIAGNOSIS — Z48813 Encounter for surgical aftercare following surgery on the respiratory system: Secondary | ICD-10-CM | POA: Diagnosis not present

## 2024-03-29 MED ORDER — LIDOCAINE HCL 1 % IJ SOLN
INTRAMUSCULAR | Status: AC
  Filled 2024-03-29: qty 20

## 2024-03-29 NOTE — Procedures (Signed)
 Ultrasound-guided  therapeutic right thoracentesis performed yielding 1 liter of yellow fluid. No immediate complications. Follow-up chest x-ray pending. EBL none.

## 2024-03-30 ENCOUNTER — Ambulatory Visit: Admitting: Physical Therapy

## 2024-03-30 ENCOUNTER — Encounter: Payer: Self-pay | Admitting: Physical Therapy

## 2024-03-30 DIAGNOSIS — R2689 Other abnormalities of gait and mobility: Secondary | ICD-10-CM | POA: Diagnosis not present

## 2024-03-30 DIAGNOSIS — R2681 Unsteadiness on feet: Secondary | ICD-10-CM

## 2024-03-30 NOTE — Therapy (Signed)
 OUTPATIENT PHYSICAL THERAPY NEURO TREATMENT/DISCHARGE SUMMARY   Patient Name: Hayley Wu MRN: [id number] DOB:[birth year], 73 y.o., female Today's Date: 03/30/2024  PCP: Lanis Thresa JAYSON DEVONNA REFERRING PROVIDER: Stamey, [name], FNP     PHYSICAL THERAPY DISCHARGE SUMMARY  Visits from Start of Care: 73  Current functional level related to goals / functional outcomes: See below for LTGs.  She has met LTG 5; she has progressed towards but not met LTG 8 and 10 for improved gait velocity and 6 MWT for gait endurance.     Remaining deficits: Strength, balance, endurance (pt is continuing active chemo, so activity level and fatigue varies day to day)   Education / Equipment: Educated in [employer], ways to exercise using You Tube videos, progression of gait to cane/no device   Patient agrees to discharge. Patient goals were not met. Patient is being discharged due to lack of progress.  Pt has maintained a similar score for objective measures over the past few goal checks; she has been educated as above and will discharge this visit.  END OF SESSION:  PT End of Session - 03/30/24 1020     Visit Number 42    Number of Visits 42    Date for Recertification  03/30/24    Authorization Type [employer]    Progress Note Due on Visit 44    PT Start Time 1020    PT Stop Time 1100    PT Time Calculation (min) 40 min    Equipment Utilized During Treatment Gait belt    Activity Tolerance Patient tolerated treatment well    Behavior During Therapy WFL for tasks assessed/performed                  Past Medical History:  Diagnosis Date   Allergy    Contact lens/glasses fitting    Hypertension    Past Surgical History:  Procedure Laterality Date   ABDOMINAL HYSTERECTOMY     BREAST SURGERY  2010   rt lumpectomy-neg   IR IMAGING GUIDED PORT INSERTION  12/18/2022   LAMINECTOMY N/A 09/21/2022   Procedure: THORACIC LAMINECTOMY FOR TUMOR;  Surgeon: Joshua Alm RAMAN, MD;   Location: [facility] OR;  Service: Neurosurgery;  Laterality: N/A;   OPEN REDUCTION INTERNAL FIXATION (ORIF) DISTAL RADIAL FRACTURE Left 11/26/2013   Procedure: OPEN REDUCTION INTERNAL FIXATION (ORIF) LEFT  DISTAL RADIUS ;  Surgeon: Dempsey JINNY Sensor, MD;  Location: [facility];  Service: Orthopedics;  Laterality: Left;  with block   TUBAL LIGATION     Patient Active Problem List   Diagnosis Date Noted   Pleural effusion 03/24/2024   Acute deep vein thrombosis (DVT) of femoral vein of both lower extremities (HCC) 03/19/2023   Normocytic anemia 03/01/2023   Moderate protein malnutrition 03/01/2023   Acute respiratory failure with hypoxia (HCC) 03/01/2023   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   Cardiomegaly 03/01/2023   Pleural effusion due to CHF (congestive heart failure) (HCC) 03/01/2023   Port-A-Cath in place 12/30/2022   Encounter for antineoplastic immunotherapy 11/18/2022   Encounter for antineoplastic chemotherapy 11/18/2022   Constipation 11/05/2022   Goals of care, counseling/discussion 10/21/2022   Cancer, metastatic to bone (HCC) 10/04/2022   Adenocarcinoma of right lung, stage 4 (HCC) 10/01/2022   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 09/20/2022   Hypokalemia 09/20/2022   Lung mass 09/20/2022  Hypothyroidism 09/20/2022   Cholelithiasis 09/20/2022   Lumbar spondylosis 08/26/2022   Thoracic back pain 04/04/2022   Low back pain 04/04/2022   Hypertension 09/23/2011    ONSET DATE: 08/08/2023 (MD order)  REFERRING DIAG:  Z91.81 (ICD-10-CM) - History of falling  C34.91 (ICD-10-CM) - Malignant neoplasm of unspecified part of right bronchus or lung    THERAPY DIAG:  Other abnormalities of gait and mobility  Unsteadiness on feet  Rationale for Evaluation and Treatment: Rehabilitation  SUBJECTIVE:                                                                                                                                                                                              SUBJECTIVE STATEMENT: Had the fluid removed yesterday.  I'll be sore for a little while; they took out a liter of fluid.    Pt accompanied by: self  PERTINENT HISTORY: Stage IV (T1b, N2, M1 C) non-small cell lung cancer, adenocarcinoma presented with right lower lobe lung nodule in addition to small bilateral pulmonary nodules and right hilar and mediastinal lymphadenopathy in addition to liver and bone metastasis diagnosed in April 2024. 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    PAIN:  Are you having pain? Yes: NPRS scale: no/10 Pain location: low back Pain description: sore Aggravating factors: bending, sitting wrong  Relieving factors: tylenol    PRECAUTIONS: Fall and Other: currently in treatment for lung cancer  RED FLAGS: None   WEIGHT BEARING RESTRICTIONS: No  FALLS: Has patient fallen in last 6 months? No  LIVING ENVIRONMENT: Lives with: lives alone Lives in: House/apartment Stairs: 1 step to get into home Has following equipment at home: Single point cane, Environmental consultant - 4 wheeled, Wheelchair (manual), and shower chair  PLOF: Independent with household mobility with device and Independent with community mobility with device  PATIENT GOALS: Still need to work on balance, endurance, and would like to work on cane more.  OBJECTIVE:    TODAY'S TREATMENT: 03/30/2024 Activity Comments  O2 sats 93-96% HR 115 bpm   TUG:  17.88 sec with no device 16.66 sec with cane   Gait velocity:  16.28 sec  cane (2.01 ft/sec) 17.41 sec no device  (1.88 ft/sec)   DGI :  16/24 O2 sats 91%, HR 120 bpm  6 MWT:  495 ft, no seated breaks; improved from 475 ft O2 sats 90-91%, HR 124 bpm           [facility] PT Assessment -  03/30/24 0001  Dynamic Gait Index   Level Surface Mild Impairment    Change in Gait Speed Mild Impairment    Gait with Horizontal Head Turns Mild Impairment    Gait with Vertical Head Turns Mild Impairment    Gait and Pivot Turn Mild Impairment    Step Over Obstacle Mild Impairment    Step Around Obstacles Mild Impairment    Steps Mild Impairment    Total Score 16            PATIENT EDUCATION: Education details: Discussed POC and plans for discharge this visit; revisited information about YouTube videos, encouraged continued activity level/energy conservation  Person educated: Patient Education method: Explanation Education comprehension: verbalized understanding     HOME EXERCISE PROGRAM: Access Code: [email or web address] URL: [email or web address] Date: 11/18/2023 Prepared by: [facility] - Outpatient  Rehab - [facility]  Program Notes practice walking inside the house with your cane for 5 minutes per day.   Exercises - Seated Hamstring Curls with Resistance  - 1 x daily - 5 x weekly - 3 sets - 10 reps - Seated Knee Extension with Resistance  - 1 x daily - 5 x weekly - 3 sets - 10 reps - Seated Hip Abduction with Resistance  - 1 x daily - 5 x weekly - 2 sets - 10 reps - Side Stepping with Counter Support  - 1 x daily - 5 x weekly - 2-3 sets - 10 reps - Alternating Step Taps with Counter Support  - 1 x daily - 5 x weekly - 3 sets - 10 reps - Backward Walking with Counter Support  - 1 x daily - 5 x weekly - 1 sets - 5 reps - Squat with Chair Touch  - 1 x daily - 5 x weekly - 2 sets - 10 reps - Corner Balance Feet Together With Eyes Open  - 1 x daily - 5 x weekly - 2 sets - 30 sec hold - Corner Balance Feet Apart: Eyes Open With Head Turns  - 1 x daily - 5 x weekly - 2 sets - 30 sec hold - Romberg Stance with Head Nods  - 1 x daily - 5 x weekly - 2 sets - 30 sec hold - Stride Stance Weight Shift  - 1-2 x daily - 7 x weekly - 1-2 sets - 10 reps - Standing  Gastroc Stretch on Step  - 1 x daily - 7 x weekly - 1 sets - 3 reps - 15-30 sec hold - Seated Figure 4 Piriformis Stretch  - 1 x daily - 7 x weekly - 1 sets - 3 reps - 15 sec hold   Note: Objective measures were completed at Evaluation unless otherwise noted.  DIAGNOSTIC FINDINGS: NA for this episode  COGNITION: Overall cognitive status: Within functional limits for tasks assessed   SENSATION: Light touch: WFL Sometimes reports pressure/heaviness in feet  MUSCLE TONE: WFL BLEs   POSTURE: rounded shoulders and forward head  LOWER EXTREMITY ROM:   ACTIVE ROM WFL  LOWER EXTREMITY MMT:    MMT Right Eval Left Eval  Hip flexion 3+ 3+  Hip extension    Hip abduction 4 4  Hip adduction 4 4  Hip internal rotation    Hip external rotation    Knee flexion 3+ 4  Knee extension 4 4  Ankle dorsiflexion 3+ 3+  Ankle plantarflexion    Ankle inversion    Ankle eversion    (Blank rows = not tested)    TRANSFERS:  Assistive device utilized: Environmental consultant - 4 wheeled  Sit to stand: Modified independence Stand to sit: Modified independence   GAIT: Gait pattern: step through pattern, decreased step length- Right, decreased stride length, and wide BOS Distance walked: 50 ft x 2 Assistive device utilized: Single point cane and Walker - 4 wheeled Level of assistance: SBA and CGA Comments: Pt brought in her cane (round tip base with SPC)  FUNCTIONAL TESTS:  5 times sit to stand: 13.63 sec arms crossed at chest Timed up and go (TUG): 17.37 with rollator; 20.91 sec with cane  10 meter walk test: 18.97 sec with rollator (1.73 ft/sec); 27.19 sec with cane (1.21 f/tsec) Berg Balance Scale: 32/56  [facility] PT Assessment - 03/30/24 0001       Dynamic Gait Index   Level Surface Mild Impairment    Change in Gait Speed Mild Impairment    Gait with Horizontal Head Turns Mild Impairment    Gait with Vertical Head Turns Mild Impairment    Gait and Pivot Turn Mild Impairment    Step Over Obstacle  Mild Impairment    Step Around Obstacles Mild Impairment    Steps Mild Impairment    Total Score 16                                                                                                                                                 GOALS: Goals reviewed with patient? Yes  SHORT TERM GOALS: Target date: 03/02/2024      Pt will be independent with HEP for improved strength, balance, gait. Baseline: reports some days better than others Goal status: MET 09/22/23  2.  Pt will improve 5x sit<>stand to less than or equal to 12 sec to demonstrate improved functional strength and transfer efficiency. Baseline:  13.63 sec; 14.49 sec without UE support 09/22/23 Goal status: NOT MET 09/22/23  3.  Pt will improve Berg score to at least 38/56 to decrease fall risk. Baseline: 32/56; 49/56 09/22/23 Goal status: MET 09/22/23  4.  Pt will improve gait velocity to at least 2 ft/sec with rollator for improved gait efficiency and safety. Baseline: 1.72 ft/sec; 2.1 ft/sec 09/22/23 Goal status: MET 09/22/23  5.  Demo ability to pick up items from ground without UE support to improve safety in home  Baseline: UE support; no support  Goal status: MET  6.  Report ability to ambulate household distances 50% of the time without AD  Baseline: 10%  Goal status: IN PROGRESS  LONG TERM GOALS: Target date: 10/10/2023>UPDATED TARGET 11/21/2023>UPDATED TARGET 03/30/2024      Pt will be independent with progression of HEP for improved strength, balance, gait. Baseline: continues to be updated; pt not consistently doing 03/30/2024 Goal status: NOT MET, 03/30/2024  2.  Pt will improve TUG score to less than or equal to 13.5 sec for decreased fall  risk. Baseline: 15.9 sec cane 11/20/2023; 14 sec w/ cane; 15 sec w/ cane, 14 sec w/out cane; (17.88 sec no device; 16.66 sec with cane 03/30/2024) Goal status: NOT MET 03/30/2024  3.  Pt will improve Berg score to at least 45/56 to decrease fall  risk. Baseline: 32/56; 49/56 09/22/23 Goal status: MET 09/22/23   5.  Pt will verbalize continued community fitness upon d/c from PT. Baseline: have provided education, endorse at home Youtube videos Goal status: MET, 03/30/2024  6. Pt will improve DGI score to at least 18/24 to decrease fall risk.  Baseline:  11/24>15/24; 16/24; 16/24 03/30/2024  Goal status:  NOT MET 03/30/2024  7.  Pt will negotiate single curb step with cane and modified independent for improved community mobility.  Baseline:  CGA 11/20/2023; Supervision w/ cane; modified indep  Goal status:  Goal met  8.  Pt will improve gait velocity to at least 2.3 ft/sec for improved gait efficiency and safety.  Baseline:  2.07 ft/sec; 1.7 ft/sec w/ cane; 1.8 ft/sec w/ cane; (2 ft/sec with cane; 1.88 ft/sec no device 03/30/2024)  Goal status: NOT MET 03/30/2024    9.  Pt will perform Condition 4 on MCTSIB 30 sec with mod sway or better for improved balance.    Baseline:  19 sec; moderate x 9 sec; 30 sec mild-mod    Goal status:  MET  10. to improve to at least 645' to demo MCID    Baseline: 475' 02/17/24; 495 ft with cane 03/30/2024    Goal status: NOT MET 03/30/2024  ASSESSMENT:  CLINICAL IMPRESSION: Pt presents today with reports that they took about a liter of fluid from lining of her lungs yesterday; her O2 sats do remain above 90% during PT session today (compared to 80's last visit).  Skilled PT session focused on assessing goals.  DGI score is 16/24, unchanged; TUG score is 16-17 seconds, slightly increased from last checks.  Gait velocity and 6 MWT scores are slightly improved, just not to goal level.  Pt has been educated in various exercises for HEP; however, pt has consistently been reporting she is not performing HEP.  She has also been educated in [employer] as ways to get in exercises.  Pt's ongoing chemo demands, recent fluid in lungs (and elevated HR), fatigue and energy levels have limited further  progression with therapy.  During the course of therapy, she has been able to progress gait primarily to cane, with use of RW for longer distances; she is able to ambulate short distances with no device, though fear of falling limits further independent gait.  She appears to have maximized rehab potential at this time and has been educated that she may benefit from skilled PT again in the future.  Discharge completed today. OBJECTIVE IMPAIRMENTS: Abnormal gait, decreased balance, decreased mobility, difficulty walking, and decreased strength.   ACTIVITY LIMITATIONS: bending, standing, squatting, transfers, bed mobility, and locomotion level  PARTICIPATION LIMITATIONS: driving, shopping, and community activity  PERSONAL FACTORS: 3+ comorbidities: see above are also affecting patient's functional outcome.   REHAB POTENTIAL: Good  CLINICAL DECISION MAKING: Evolving/moderate complexity  EVALUATION COMPLEXITY: Moderate  PLAN:  PT FREQUENCY: 1x/week  PT DURATION: 8 weeks   PLANNED INTERVENTIONS: 97750- Physical Performance Testing, 97110-Therapeutic exercises, 97530- Therapeutic activity, [id number]- Neuromuscular re-education, 97535- Self Care, [id number]- Manual therapy, [phone]- Gait training, Patient/Family education, and Balance training  PLAN FOR NEXT SESSION:  Discharge this visit.       Greig Anon, PT 03/30/24 3:23  PM Phone: [phone] Fax: [phone]  [facility] at [facility] [address], [address] [place], [place] [address] Phone # [phone] Fax # [phone]

## 2024-04-07 ENCOUNTER — Ambulatory Visit (HOSPITAL_COMMUNITY)
Admission: RE | Admit: 2024-04-07 | Discharge: 2024-04-07 | Disposition: A | Discharge status: Home / Self Care | Source: Ambulatory Visit | Attending: Physician Assistant | Admitting: Physician Assistant

## 2024-04-07 DIAGNOSIS — J90 Pleural effusion, not elsewhere classified: Secondary | ICD-10-CM | POA: Diagnosis not present

## 2024-04-07 DIAGNOSIS — C3491 Malignant neoplasm of unspecified part of right bronchus or lung: Secondary | ICD-10-CM | POA: Diagnosis not present

## 2024-04-07 DIAGNOSIS — J439 Emphysema, unspecified: Secondary | ICD-10-CM | POA: Diagnosis not present

## 2024-04-07 MED ORDER — HEPARIN SOD (PORK) LOCK FLUSH 100 UNIT/ML IV SOLN
500.0000 [IU] | Freq: Once | INTRAVENOUS | Status: AC
  Administered 2024-04-07: 500 [IU] via INTRAVENOUS

## 2024-04-07 MED ORDER — IOHEXOL 300 MG/ML  SOLN
100.0000 mL | Freq: Once | INTRAMUSCULAR | Status: AC | PRN
  Administered 2024-04-07: 100 mL via INTRAVENOUS

## 2024-04-08 NOTE — Progress Notes (Signed)
 [facility] OFFICE PROGRESS NOTE  Wu, Hayley POUR, FNP [address] [place] [address]  DIAGNOSIS: Stage IV (T1b, N2, M1 C) non-small cell lung cancer, adenocarcinoma presented with right lower lobe lung nodule in addition to small bilateral pulmonary nodules and right hilar and mediastinal lymphadenopathy in addition to liver and bone metastasis diagnosed in April 2024. 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   PDL1: 20%   Guardant 360: no actionable mutations.  PRIOR THERAPY: 1) Decompressive thoracic laminectomy of the T7 with spinal cord decompression and biopsy of the epidural mass in April 2024.  2) Palliative radiation to the bone under the care of Dr. Izell. Last day scheduled for 10/25/22   CURRENT THERAPY: Carboplatin  for an AUC of 5, Alimta  500 mg/m, Keytruda  200 mg IV every 3 weeks.  First dose expected on 10/28/2022. Status post 23 cycles.  Starting from cycle #5 the patient is on maintenance treatment with Alimta  and Keytruda  every 3 weeks.  Starting from cycle #7 the patient will be treated with single agent Alimta  500 Mg/M2 every 3 weeks.  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   INTERVAL HISTORY: Hayley Wu 73 y.o. female returns to the clinic today for a follow-up visit. She was last seen by myself 3 weeks ago. She is currently on single agent Alimta . Keytruda  was discontinued due to suspicious immunotherapy mediated pneumonitis after a hospitalization in September 2024.  She underwent a thoracentesis in the interval in which they obtained 1 L of fluid. Her breathing is improved at this time although her most recent scan looks like her fluid re-accumulated.   No nausea or vomiting with her last treatment, although she experiences a queasy stomach feeling. She takes nausea medication, which helps  alleviate the queasiness. No diarrhea, constipation, headaches, vision changes, or rashes.  Her weight has remained stable, and she has been forcing herself to eat better in hopes of gaining weight. Her weight is about the same though.    Otherwise she denies any fever, chills, or night sweats.  She recently had a restaing CT scan of the CAP.  She is here today for evaluation and repeat blood work before undergoing cycle #24.    MEDICAL HISTORY: Past Medical History:  Diagnosis Date   Allergy    Contact lens/glasses fitting    Hypertension     ALLERGIES:  is allergic to erythromycin, amoxicillin, latex, and prednisone .  MEDICATIONS:  Current Outpatient Medications  Medication Sig Dispense Refill   acetaminophen  (TYLENOL ) 500 MG tablet Take 500 mg by mouth every 6 (six) hours as needed for moderate pain.     amLODipine  (NORVASC ) 10 MG tablet Take 10 mg by mouth daily.     apixaban  (ELIQUIS ) 5 MG TABS tablet Take 1 tablet (5 mg total) by mouth 2 (two) times daily. 60 tablet 2   folic acid  (FOLVITE ) 1 MG tablet Take 1 tablet (1 mg total) by mouth daily. 90 tablet 0   Ipratropium-Albuterol  (COMBIVENT ) 20-100 MCG/ACT AERS respimat Inhale 1 puff into the lungs every 6 (six) hours as needed for wheezing or shortness of breath. 4 g 1   levothyroxine  (SYNTHROID ) 150 MCG tablet Take 150 mcg by mouth daily before breakfast.     lidocaine -prilocaine  (EMLA ) cream Apply 1 Application topically as needed. 30 g 2   mirtazapine  (REMERON ) 30 MG tablet TAKE 1 TABLET  BY MOUTH AT BEDTIME 90 tablet 0   prochlorperazine  (COMPAZINE ) 10 MG tablet Take 1 tablet (10 mg total) by mouth every 6 (six) hours as needed. 30 tablet 2   VITAMIN D PO Take 1 tablet by mouth daily.     No current facility-administered medications for this visit.    SURGICAL HISTORY:  Past Surgical History:  Procedure Laterality Date   ABDOMINAL HYSTERECTOMY     BREAST SURGERY  2010   rt lumpectomy-neg   IR IMAGING GUIDED PORT  INSERTION  12/18/2022   LAMINECTOMY N/A 09/21/2022   Procedure: THORACIC LAMINECTOMY FOR TUMOR;  Surgeon: Joshua Alm RAMAN, MD;  Location: [facility] OR;  Service: Neurosurgery;  Laterality: N/A;   OPEN REDUCTION INTERNAL FIXATION (ORIF) DISTAL RADIAL FRACTURE Left 11/26/2013   Procedure: OPEN REDUCTION INTERNAL FIXATION (ORIF) LEFT  DISTAL RADIUS ;  Surgeon: Dempsey JINNY Sensor, MD;  Location: [facility];  Service: Orthopedics;  Laterality: Left;  with block   TUBAL LIGATION      REVIEW OF SYSTEMS:   Constitutional: Positive for stable fatigue, weight loss, and stable diminished appetite. Negative for chills and fever. HENT: Negative for mouth sores, nosebleeds, sore throat and trouble swallowing.   Eyes: Negative for eye problems and icterus.  Respiratory: Positive for stable dyspnea on exertion. Negative for cough, hemoptysis, and wheezing.     Cardiovascular: Negative for chest pain and swelling.  Gastrointestinal: Negative for abdominal pain, nausea, constipation, diarrhea, and vomiting.  Genitourinary: Negative for bladder incontinence, difficulty urinating, dysuria, frequency and hematuria.   Musculoskeletal: Negative for back pain, gait problem, neck pain and neck stiffness.  Skin: Negative for itching and rash.  Neurological: Negative for dizziness, extremity weakness, gait problem, headaches, light-headedness and seizures.  Hematological: Negative for adenopathy. Does not bruise/bleed easily.  Psychiatric/Behavioral: Negative for confusion, depression and sleep disturbance. The patient is not nervous/anxious.   PHYSICAL EXAMINATION:  There were no vitals taken for this visit.  ECOG PERFORMANCE STATUS: 1  Physical Exam  Constitutional: Oriented to person, place, and time and well-developed, well-nourished, and in no distress. HENT:  Head: Normocephalic and atraumatic.  Mouth/Throat: Oropharynx is clear and moist. No oropharyngeal exudate.  Eyes: Conjunctivae are normal. Right  eye exhibits no discharge. Left eye exhibits no discharge. No scleral icterus.  Neck: Normal range of motion. Neck supple.  Cardiovascular: Tachycardic, regular rhythm, normal heart sounds and intact distal pulses.   Pulmonary/Chest: Effort normal. Decreased breath sounds in right lung. No respiratory distress. No wheezes. No rales.  Abdominal: Soft. Bowel sounds are normal. Exhibits no distension and no mass. There is no tenderness.  Musculoskeletal: Normal range of motion.  Lymphadenopathy:    No cervical adenopathy.  Neurological: Alert and oriented to person, place, and time. Exhibits muscle wasting. Ambulates with a walker.  Skin: Skin is warm and dry. No rash noted. Not diaphoretic. No erythema. No pallor.  Psychiatric: Mood, memory and judgment normal.  Vitals reviewed.  LABORATORY DATA: Lab Results  Component Value Date   WBC 5.5 03/24/2024   HGB 13.2 03/24/2024   HCT 41.0 03/24/2024   MCV 89.1 03/24/2024   PLT 250 03/24/2024      Chemistry      Component Value Date/Time   NA 138 03/24/2024 1406   K 3.9 03/24/2024 1406   CL 104 03/24/2024 1406   CO2 28 03/24/2024 1406   BUN 11 03/24/2024 1406   CREATININE 0.72 03/24/2024 1406      Component Value Date/Time   CALCIUM 9.9  03/24/2024 1406   ALKPHOS 87 03/24/2024 1406   AST 18 03/24/2024 1406   ALT 14 03/24/2024 1406   BILITOT 0.4 03/24/2024 1406       RADIOGRAPHIC STUDIES:  US  THORACENTESIS ASP PLEURAL SPACE W/IMG GUIDE Result Date: 03/29/2024 INDICATION: Patient with history of stage IV right lung adenocarcinoma, dyspnea, right pleural effusion. Request received for therapeutic right thoracentesis. EXAM: ULTRASOUND GUIDED THERAPEUTIC RIGHT THORACENTESIS MEDICATIONS: 8 mL 1% lidocaine  to skin and subcutaneous tissue COMPLICATIONS: None immediate. PROCEDURE: An ultrasound guided thoracentesis was thoroughly discussed with the patient and questions answered. The benefits, risks, alternatives and complications were  also discussed. The patient understands and wishes to proceed with the procedure. Written consent was obtained. Ultrasound was performed to localize and mark an adequate pocket of fluid in the right chest. The area was then prepped and draped in the normal sterile fashion. 1% Lidocaine  was used for local anesthesia. Under ultrasound guidance a 6 Fr Safe-T-Centesis catheter was introduced. Thoracentesis was performed. The catheter was removed and a dressing applied. FINDINGS: A total of approximately 1 liter of yellow fluid was removed. IMPRESSION: Successful ultrasound guided therapeutic right thoracentesis yielding 1 liter of pleural fluid. Performed by: Franky Rakers, PA-C Electronically Signed   By: Cordella Banner   On: 03/29/2024 16:00   DG Chest 1 View Result Date: 03/29/2024 CLINICAL DATA:  Status post right-sided thoracentesis EXAM: CHEST  1 VIEW COMPARISON:  Chest one view portable 03/17/2023 FINDINGS: Cardiomegaly. Both lungs are clear. The visualized skeletal structures are unremarkable. Right-sided chest port in position with tip at the cavoatrial junction. Small residual right-sided pleural effusion. No pneumothorax. IMPRESSION: No pneumothorax.  Small residual right-sided pleural effusion. Electronically Signed   By: Cordella Banner   On: 03/29/2024 14:48     ASSESSMENT/PLAN:  This is a very pleasant 73 year old Caucasian female recently diagnosed with stage IV (T1b, N2, M1 C) non-small cell lung cancer, adenocarcinoma presented with right lower lobe lung nodule in addition to small bilateral pulmonary nodules and right hilar and mediastinal lymphadenopathy in addition to liver and bone metastasis diagnosed in April 2024. 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 Her PDL1 expression is 20%. She has no actionable mutations.    She is completed palliative radiation to the painful metastatic  bone lesions under the care of Dr. Izell in the last day radiation was on 10/25/2022.    She is currently undergoing systemic chemotherapy with carboplatin  for an AUC of 5, Alimta  500 mg/m, and Keytruda  200 mg IV every 3 weeks. She is status post 23 cycles.  Starting from cycle #5 she was on maintenance treatment with Alimta  and Keytruda .  Starting after cycle #6 Keytruda  was discontinued from the care plan due to suspicious immunotherapy mediated pneumonitis.  Therefore, she is currently undergoing single agent chemotherapy with Alimta  IV every 3 weeks.  The patient was seen with Dr. Sherrod today.  Dr. Sherrod personally and independently reviewed the scan and discussed results with the patient today.  The scan showed no evidence of disease progression.  Dr. Sherrod recommends she continue on the same treatment at the same dose.    Labs were reviewed. Recommend she proceed with cycle #24 today as scheduled. She is ok to treat with her pulse of 121, we will recheck her pulse in infusion. She had EKG at her last appointment which showed sinus tachycardia with pulse of 105.  We will see her back for follow-up visit in 3 weeks for evaluation and repeat blood work before undergoing cycle #24    She has standing orders for TSH and T4. Her results are pending from today.    She recently had thoracentesis and notes improvement in her breathing, although she did not realize she was symptomatic before. She continues to how moderate effusion. We will monitor for now but she knows to contact us  when she would like to move forward with repeat thoracentesis.    The patient was advised to call immediately if she has any concerning symptoms in the interval. The patient voices understanding of current disease status and treatment options and is in agreement with the current care plan. All questions were answered. The patient knows to call the clinic with any problems, questions or concerns. We can certainly  see the patient much sooner if necessary   No orders of the defined types were placed in this encounter.   Hayley Fitton L Brodric Schauer, PA-C 04/08/24  ADDENDUM: Hematology/Oncology Attending:  I had a face-to-face encounter with the patient today.  I reviewed her records, lab, scan and recommended her care plan.  This is a very pleasant 73 years old white female with stage IV non-small cell lung cancer, adenocarcinoma diagnosed in April 2024 with no actionable mutation and PD-L1 expression of 20%.  She started palliative systemic chemoimmunotherapy with carboplatin , Alimta  and Keytruda  every 3 weeks for 4 cycles and starting from cycle #5 she has been on maintenance treatment initially with Alimta  and Keytruda  and then Keytruda  was discontinued starting from cycle #7 secondary to suspicious immunotherapy mediated pneumonitis and the patient has been on single agent Alimta  since that time.  She received a total of 23 cycles.  She has been tolerating her treatment with Alimta  fairly well with no significant adverse effect except for occasional fatigue. She had repeat CT scan of the chest, abdomen and pelvis performed recently.  I personally independently reviewed the scan and discussed the result with the patient today.  Her scan showed no concerning findings for disease progression but she continues to have moderate pleural effusion. I recommended for the patient to continue her current treatment with maintenance Alimta  and she will proceed with cycle #24 today. We will see her back for follow-up visit in 3 weeks for evaluation before the next cycle of her treatment.  The patient was advised to call immediately if she has any other concerning symptoms in the interval. Disclaimer: This note was dictated with voice recognition software. Similar sounding words can inadvertently be transcribed and may be missed upon review. Sherrod MARLA Sherrod, MD

## 2024-04-12 ENCOUNTER — Other Ambulatory Visit: Payer: Self-pay

## 2024-04-14 ENCOUNTER — Inpatient Hospital Stay

## 2024-04-14 ENCOUNTER — Ambulatory Visit

## 2024-04-14 ENCOUNTER — Ambulatory Visit: Admitting: Internal Medicine

## 2024-04-14 ENCOUNTER — Inpatient Hospital Stay (HOSPITAL_BASED_OUTPATIENT_CLINIC_OR_DEPARTMENT_OTHER): Admitting: Physician Assistant

## 2024-04-14 ENCOUNTER — Inpatient Hospital Stay: Attending: Internal Medicine

## 2024-04-14 ENCOUNTER — Other Ambulatory Visit

## 2024-04-14 VITALS — BP 137/85 | HR 121 | Temp 97.9°F | Resp 18 | Ht 63.0 in | Wt 155.9 lb

## 2024-04-14 VITALS — HR 102

## 2024-04-14 DIAGNOSIS — C787 Secondary malignant neoplasm of liver and intrahepatic bile duct: Secondary | ICD-10-CM | POA: Diagnosis not present

## 2024-04-14 DIAGNOSIS — C3491 Malignant neoplasm of unspecified part of right bronchus or lung: Secondary | ICD-10-CM | POA: Diagnosis not present

## 2024-04-14 DIAGNOSIS — T50995D Adverse effect of other drugs, medicaments and biological substances, subsequent encounter: Secondary | ICD-10-CM | POA: Insufficient documentation

## 2024-04-14 DIAGNOSIS — C7951 Secondary malignant neoplasm of bone: Secondary | ICD-10-CM | POA: Diagnosis not present

## 2024-04-14 DIAGNOSIS — R000 Tachycardia, unspecified: Secondary | ICD-10-CM | POA: Insufficient documentation

## 2024-04-14 DIAGNOSIS — J90 Pleural effusion, not elsewhere classified: Secondary | ICD-10-CM | POA: Insufficient documentation

## 2024-04-14 DIAGNOSIS — Z9226 Personal history of immune checkpoint inhibitor therapy: Secondary | ICD-10-CM | POA: Insufficient documentation

## 2024-04-14 DIAGNOSIS — C3431 Malignant neoplasm of lower lobe, right bronchus or lung: Secondary | ICD-10-CM | POA: Insufficient documentation

## 2024-04-14 DIAGNOSIS — Z5111 Encounter for antineoplastic chemotherapy: Secondary | ICD-10-CM

## 2024-04-14 DIAGNOSIS — J704 Drug-induced interstitial lung disorders, unspecified: Secondary | ICD-10-CM | POA: Insufficient documentation

## 2024-04-14 DIAGNOSIS — Z79899 Other long term (current) drug therapy: Secondary | ICD-10-CM | POA: Diagnosis not present

## 2024-04-14 LAB — CBC WITH DIFFERENTIAL (CANCER CENTER ONLY)
Abs Immature Granulocytes: 0.03 K/uL (ref 0.00–0.07)
Basophils Absolute: 0 K/uL (ref 0.0–0.1)
Basophils Relative: 0 %
Eosinophils Absolute: 0 K/uL (ref 0.0–0.5)
Eosinophils Relative: 1 %
HCT: 42.7 % (ref 36.0–46.0)
Hemoglobin: 14 g/dL (ref 12.0–15.0)
Immature Granulocytes: 1 %
Lymphocytes Relative: 8 %
Lymphs Abs: 0.5 K/uL — ABNORMAL LOW (ref 0.7–4.0)
MCH: 29.6 pg (ref 26.0–34.0)
MCHC: 32.8 g/dL (ref 30.0–36.0)
MCV: 90.3 fL (ref 80.0–100.0)
Monocytes Absolute: 0.5 K/uL (ref 0.1–1.0)
Monocytes Relative: 7 %
Neutro Abs: 5.3 K/uL (ref 1.7–7.7)
Neutrophils Relative %: 83 %
Platelet Count: 245 K/uL (ref 150–400)
RBC: 4.73 MIL/uL (ref 3.87–5.11)
RDW: 14.3 % (ref 11.5–15.5)
WBC Count: 6.4 K/uL (ref 4.0–10.5)
nRBC: 0 % (ref 0.0–0.2)

## 2024-04-14 LAB — CMP (CANCER CENTER ONLY)
ALT: 14 U/L (ref 0–44)
AST: 21 U/L (ref 15–41)
Albumin: 4.1 g/dL (ref 3.5–5.0)
Alkaline Phosphatase: 83 U/L (ref 38–126)
Anion gap: 8 (ref 5–15)
BUN: 11 mg/dL (ref 8–23)
CO2: 28 mmol/L (ref 22–32)
Calcium: 9.3 mg/dL (ref 8.9–10.3)
Chloride: 103 mmol/L (ref 98–111)
Creatinine: 0.76 mg/dL (ref 0.44–1.00)
GFR, Estimated: >60 mL/min (ref >60)
Glucose, Bld: 132 mg/dL — ABNORMAL HIGH (ref 70–99)
Potassium: 3.7 mmol/L (ref 3.5–5.1)
Sodium: 139 mmol/L (ref 135–145)
Total Bilirubin: 0.5 mg/dL (ref 0.0–1.2)
Total Protein: 7 g/dL (ref 6.5–8.1)

## 2024-04-14 MED ORDER — DEXAMETHASONE SODIUM PHOSPHATE 10 MG/ML IJ SOLN
10.0000 mg | Freq: Once | INTRAMUSCULAR | Status: AC
  Administered 2024-04-14: 10 mg via INTRAVENOUS

## 2024-04-14 MED ORDER — PROCHLORPERAZINE MALEATE 10 MG PO TABS
10.0000 mg | ORAL_TABLET | Freq: Once | ORAL | Status: AC
  Administered 2024-04-14: 10 mg via ORAL
  Filled 2024-04-14: qty 1

## 2024-04-14 MED ORDER — SODIUM CHLORIDE 0.9 % IV SOLN
500.0000 mg/m2 | Freq: Once | INTRAVENOUS | Status: AC
  Administered 2024-04-14: 900 mg via INTRAVENOUS
  Filled 2024-04-14: qty 20

## 2024-04-14 MED ORDER — SODIUM CHLORIDE 0.9 % IV SOLN: Freq: Once | INTRAVENOUS | Status: AC

## 2024-04-14 MED ORDER — CYANOCOBALAMIN 1000 MCG/ML IJ SOLN
1000.0000 ug | Freq: Once | INTRAMUSCULAR | Status: AC
  Administered 2024-04-14: 1000 ug via INTRAMUSCULAR
  Filled 2024-04-14: qty 1

## 2024-04-14 NOTE — Patient Instructions (Signed)
 [facility] - A DEPT OF [facility]  Discharge Instructions: Thank you for choosing [facility] to provide your oncology and hematology care.   If you have a lab appointment with the Cancer Center, please go directly to the Cancer Center and check in at the registration area.   Wear comfortable clothing and clothing appropriate for easy access to any Portacath or PICC line.   We strive to give you quality time with your provider. You may need to reschedule your appointment if you arrive late (15 or more minutes).  Arriving late affects you and other patients whose appointments are after yours.  Also, if you miss three or more appointments without notifying the office, you may be dismissed from the clinic at the provider's discretion.      For prescription refill requests, have your pharmacy contact our office and allow 72 hours for refills to be completed.    Today you received the following chemotherapy and/or immunotherapy agents: PEMEtrexed  Disodium (ALIMTA )      To help prevent nausea and vomiting after your treatment, we encourage you to take your nausea medication as directed.  BELOW ARE SYMPTOMS THAT SHOULD BE REPORTED IMMEDIATELY: *FEVER GREATER THAN 100.4 F (38 C) OR HIGHER *CHILLS OR SWEATING *NAUSEA AND VOMITING THAT IS NOT CONTROLLED WITH YOUR NAUSEA MEDICATION *UNUSUAL SHORTNESS OF BREATH *UNUSUAL BRUISING OR BLEEDING *URINARY PROBLEMS (pain or burning when urinating, or frequent urination) *BOWEL PROBLEMS (unusual diarrhea, constipation, pain near the anus) TENDERNESS IN MOUTH AND THROAT WITH OR WITHOUT PRESENCE OF ULCERS (sore throat, sores in mouth, or a toothache) UNUSUAL RASH, SWELLING OR PAIN  UNUSUAL VAGINAL DISCHARGE OR ITCHING   Items with * indicate a potential emergency and should be followed up as soon as possible or go to the Emergency Department if any problems should occur.  Please show the CHEMOTHERAPY ALERT  CARD or IMMUNOTHERAPY ALERT CARD at check-in to the Emergency Department and triage nurse.  Should you have questions after your visit or need to cancel or reschedule your appointment, please contact [facility] - A DEPT OF [facility]  Dept: [phone]  and follow the prompts.  Office hours are 8:00 a.m. to 4:30 p.m. Monday - Friday. Please note that voicemails left after 4:00 p.m. may not be returned until the following business day.  We are closed weekends and major holidays. You have access to a nurse at all times for urgent questions. Please call the main number to the clinic Dept: [phone] and follow the prompts.   For any non-urgent questions, you may also contact your provider using MyChart. We now offer e-Visits for anyone 8 and older to request care online for non-urgent symptoms. For details visit [email or web address].   Also download the MyChart app! Go to the app store, search MyChart, open the app, select [facility], and log in with your MyChart username and password.

## 2024-04-15 LAB — TSH: TSH: 2.14 u[IU]/mL (ref 0.350–4.500)

## 2024-04-15 LAB — T4: T4, Total: 16 ug/dL — ABNORMAL HIGH (ref 4.5–12.0)

## 2024-04-30 NOTE — Progress Notes (Unsigned)
 [facility] OFFICE PROGRESS NOTE  Stamey, Hayley POUR, FNP [address] [place] [address]  DIAGNOSIS: Stage IV (T1b, N2, M1 C) non-small cell lung cancer, adenocarcinoma presented with right lower lobe lung nodule in addition to small bilateral pulmonary nodules and right hilar and mediastinal lymphadenopathy in addition to liver and bone metastasis diagnosed in April 2024. 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   PDL1: 20%   Guardant 360: no actionable mutations.  PRIOR THERAPY: 1) Decompressive thoracic laminectomy of the T7 with spinal cord decompression and biopsy of the epidural mass in April 2024.  2) Palliative radiation to the bone under the care of Dr. Izell. Last day scheduled for 10/25/22   CURRENT THERAPY:  Carboplatin  for an AUC of 5, Alimta  500 mg/m, Keytruda  200 mg IV every 3 weeks.  First dose expected on 10/28/2022. Status post 24 cycles.  Starting from cycle #5 the patient is on maintenance treatment with Alimta  and Keytruda  every 3 weeks.  Starting from cycle #7 the patient will be treated with single agent Alimta  500 Mg/M2 every 3 weeks.  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   INTERVAL HISTORY: Hayley Wu 73 y.o. female returns to the clinic today for a follow-up visit. She was last seen by myself 3 weeks ago. She is currently on single agent Alimta . Keytruda  was discontinued due to suspicious immunotherapy mediated pneumonitis after a hospitalization in September 2024.   She underwent a thoracentesis in the interval in which they obtained 1 L of fluid. Her breathing is improved at this time although her most recent scan looks like her fluid re-accumulated.    No nausea or vomiting with her last treatment, although she experiences a queasy stomach feeling. She takes nausea medication, which  helps alleviate the queasiness. No diarrhea, constipation, headaches, vision changes, or rashes.   Her weight has remained stable, and she has been forcing herself to eat better in hopes of gaining weight. Her weight is about the same though.    Otherwise she denies any fever, chills, or night sweats. She is here today for evaluation and repeat blood work before undergoing cycle #25.    MEDICAL HISTORY: Past Medical History:  Diagnosis Date   Allergy    Contact lens/glasses fitting    Hypertension     ALLERGIES:  is allergic to erythromycin, amoxicillin, latex, and prednisone .  MEDICATIONS:  Current Outpatient Medications  Medication Sig Dispense Refill   acetaminophen  (TYLENOL ) 500 MG tablet Take 500 mg by mouth every 6 (six) hours as needed for moderate pain.     amLODipine  (NORVASC ) 10 MG tablet Take 10 mg by mouth daily.     apixaban  (ELIQUIS ) 5 MG TABS tablet Take 1 tablet (5 mg total) by mouth 2 (two) times daily. 60 tablet 2   folic acid  (FOLVITE ) 1 MG tablet Take 1 tablet (1 mg total) by mouth daily. 90 tablet 0   Ipratropium-Albuterol  (COMBIVENT ) 20-100 MCG/ACT AERS respimat Inhale 1 puff into the lungs every 6 (six) hours as needed for wheezing or shortness of breath. 4 g 1   levothyroxine  (SYNTHROID ) 150 MCG tablet Take 150 mcg by mouth daily before breakfast.     lidocaine -prilocaine  (EMLA ) cream Apply 1 Application topically as needed. 30 g 2   mirtazapine  (REMERON ) 30 MG tablet TAKE 1 TABLET BY MOUTH AT BEDTIME 90 tablet 0  prochlorperazine  (COMPAZINE ) 10 MG tablet Take 1 tablet (10 mg total) by mouth every 6 (six) hours as needed. 30 tablet 2   VITAMIN D PO Take 1 tablet by mouth daily.     No current facility-administered medications for this visit.    SURGICAL HISTORY:  Past Surgical History:  Procedure Laterality Date   ABDOMINAL HYSTERECTOMY     BREAST SURGERY  2010   rt lumpectomy-neg   IR IMAGING GUIDED PORT INSERTION  12/18/2022   LAMINECTOMY N/A  09/21/2022   Procedure: THORACIC LAMINECTOMY FOR TUMOR;  Surgeon: Joshua Alm RAMAN, MD;  Location: [facility] OR;  Service: Neurosurgery;  Laterality: N/A;   OPEN REDUCTION INTERNAL FIXATION (ORIF) DISTAL RADIAL FRACTURE Left 11/26/2013   Procedure: OPEN REDUCTION INTERNAL FIXATION (ORIF) LEFT  DISTAL RADIUS ;  Surgeon: Dempsey JINNY Sensor, MD;  Location: [facility];  Service: Orthopedics;  Laterality: Left;  with block   TUBAL LIGATION      REVIEW OF SYSTEMS:   Review of Systems  Constitutional: Negative for appetite change, chills, fatigue, fever and unexpected weight change.  HENT:   Negative for mouth sores, nosebleeds, sore throat and trouble swallowing.   Eyes: Negative for eye problems and icterus.  Respiratory: Negative for cough, hemoptysis, shortness of breath and wheezing.   Cardiovascular: Negative for chest pain and leg swelling.  Gastrointestinal: Negative for abdominal pain, constipation, diarrhea, nausea and vomiting.  Genitourinary: Negative for bladder incontinence, difficulty urinating, dysuria, frequency and hematuria.   Musculoskeletal: Negative for back pain, gait problem, neck pain and neck stiffness.  Skin: Negative for itching and rash.  Neurological: Negative for dizziness, extremity weakness, gait problem, headaches, light-headedness and seizures.  Hematological: Negative for adenopathy. Does not bruise/bleed easily.  Psychiatric/Behavioral: Negative for confusion, depression and sleep disturbance. The patient is not nervous/anxious.     PHYSICAL EXAMINATION:  There were no vitals taken for this visit.  ECOG PERFORMANCE STATUS: {CHL ONC ECOG [id number]  Physical Exam  Constitutional: Oriented to person, place, and time and well-developed, well-nourished, and in no distress. No distress.  HENT:  Head: Normocephalic and atraumatic.  Mouth/Throat: Oropharynx is clear and moist. No oropharyngeal exudate.  Eyes: Conjunctivae are normal. Right eye exhibits no  discharge. Left eye exhibits no discharge. No scleral icterus.  Neck: Normal range of motion. Neck supple.  Cardiovascular: Normal rate, regular rhythm, normal heart sounds and intact distal pulses.   Pulmonary/Chest: Effort normal and breath sounds normal. No respiratory distress. No wheezes. No rales.  Abdominal: Soft. Bowel sounds are normal. Exhibits no distension and no mass. There is no tenderness.  Musculoskeletal: Normal range of motion. Exhibits no edema.  Lymphadenopathy:    No cervical adenopathy.  Neurological: Alert and oriented to person, place, and time. Exhibits normal muscle tone. Gait normal. Coordination normal.  Skin: Skin is warm and dry. No rash noted. Not diaphoretic. No erythema. No pallor.  Psychiatric: Mood, memory and judgment normal.  Vitals reviewed.  LABORATORY DATA: Lab Results  Component Value Date   WBC 6.4 04/14/2024   HGB 14.0 04/14/2024   HCT 42.7 04/14/2024   MCV 90.3 04/14/2024   PLT 245 04/14/2024      Chemistry      Component Value Date/Time   NA 139 04/14/2024 1342   K 3.7 04/14/2024 1342   CL 103 04/14/2024 1342   CO2 28 04/14/2024 1342   BUN 11 04/14/2024 1342   CREATININE 0.76 04/14/2024 1342      Component Value Date/Time   CALCIUM  9.3 04/14/2024 1342   ALKPHOS 83 04/14/2024 1342   AST 21 04/14/2024 1342   ALT 14 04/14/2024 1342   BILITOT 0.5 04/14/2024 1342       RADIOGRAPHIC STUDIES:  CT CHEST ABDOMEN PELVIS W CONTRAST Result Date: 04/08/2024 EXAM: CT CHEST, ABDOMEN AND PELVIS WITH CONTRAST 04/07/2024 01:44:00 PM TECHNIQUE: CT of the chest, abdomen and pelvis was performed with the administration of intravenous contrast. Multiplanar reformatted images are provided for review. Automated exposure control, iterative reconstruction, and/or weight based adjustment of the mA/kV was utilized to reduce the radiation dose to as low as reasonably achievable. CONTRAST: 100 mL of Omnipaque  300. COMPARISON: CT Chest Abdomen Pelvis  Without IV Contrast 01/16/2024. CLINICAL HISTORY: Metastatic disease evaluation, right lung cancer. *tracking code: Bo* moderate multivessel coronary artery calcification. FINDINGS: CHEST: MEDIASTINUM AND LYMPH NODES: Heart: Global cardiac size is within normal limits. Stable small pericardial effusion. Moderate multivessel coronary artery calcification. Vessels: Central pulmonary arteries are of normal caliber. Mild atherosclerotic calcification within the thoracic aorta. No aortic aneurysm. Port tip: Right internal jugular chest port tip seen within the deep right atrium in the region of the tricuspid valve. Thyroid : Thyroid  gland is absent or atrophic with a small calcified remnant noted. Esophagus: Unremarkable. Lymph nodes: No pathologic thoracic adenopathy. LUNGS AND PLEURA: Emphysema: Moderate emphysema. Chronic changes: Chronic collapse with traction bronchiectasis involving the paramediastinal upper lobes bilaterally may reflect sequela of prior radiation therapy. Nodules: Spiculated nodule within the right lower lobe appears stable measuring 8 x 18 mm and measured in smaller fashion 83 / 7. 6 mm spiculated pulmonary nodule within the right upper lobe image 36 / 7, is stable. Fibrosis: Bibasilar pulmonary fibrotic changes again noted. Other: No new focal pulmonary nodules or infiltrates. Effusions: Moderate right pleural effusion, stable since prior examination. Trace left pleural effusion, stable. Pneumothorax: No pneumothorax. ABDOMEN AND PELVIS: LIVER: Stable cyst within the left hepatic lobe. No enhancing intrahepatic mass. GALLBLADDER AND BILE DUCTS: Cholelithiasis without superimposed pericholecystic inflammatory change. No intrahepatic biliary ductal dilation. Stable extrahepatic biliary ductal dilation measuring up to 14 - 15 mm is stable from multiple prior examinations. SPLEEN: No acute abnormality. PANCREAS: Pancreas is atrophic but otherwise unremarkable. ADRENAL GLANDS: No acute abnormality.  KIDNEYS, URETERS AND BLADDER: No stones in the kidneys or ureters. No hydronephrosis. No perinephric or periureteral stranding. Urinary bladder is unremarkable. GI AND BOWEL: Appendix normal. Moderate sigmoid diverticulosis. The stomach, small bowel, and large bowel are otherwise unremarkable. There is no bowel obstruction. REPRODUCTIVE ORGANS: Status post hysterectomy. Stable 2 cm simple-appearing cyst within the residual left ovary. Residual right ovary is unremarkable. PERITONEUM AND RETROPERITONEUM: No ascites. No free air. VASCULATURE: Extensive atherosclerotic calcifications within the aortoiliac vasculature. No aortic aneurysm. ABDOMINAL AND PELVIS LYMPH NODES: No lymphadenopathy. BONES AND SOFT TISSUES: Vertebroplana compression deformity of T7 with adjacent pathologic compression deformities of C6 and T8 again identified. Retropulsion of the posterior aspect of the T7 vertebral body. Postsurgical changes of the posterior decompression of T6-7. Stable focal kyphosis at this level. Stable sclerotic metastasis involving the L2 vertebral body. No new lytic or blastic bone lesions. Osseous structures are otherwise age appropriate. No focal soft tissue abnormality. IMPRESSION: 1. Stable disease with unchanged spiculated nodules within the right upper and right lower lobes and stable sclerotic thoracolumbar metastatic foci. . 2. Moderate right pleural effusion and trace left pleural effusion, both stable. 3. Emphysema. 4. Moderate coronary artery calcification. 5. Cholelithiasis. Stable extrahepatic biliary ductal dilation. 6. Stable pathologic compression deformities T6-T8 with resultant focal  kyphosis at this level, status post posterior decompression of T6-7. Electronically signed by: Dorethia Molt MD 04/08/2024 11:54 PM EDT RP Workstation: [id number]     ASSESSMENT/PLAN:  This is a very pleasant 73 year old Caucasian female recently diagnosed with stage IV (T1b, N2, M1 C) non-small cell lung cancer,  adenocarcinoma presented with right lower lobe lung nodule in addition to small bilateral pulmonary nodules and right hilar and mediastinal lymphadenopathy in addition to liver and bone metastasis diagnosed in April 2024. 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 Her PDL1 expression is 20%. She has no actionable mutations.    She is completed palliative radiation to the painful metastatic bone lesions under the care of Dr. Izell in the last day radiation was on 10/25/2022.    She is currently undergoing systemic chemotherapy with carboplatin  for an AUC of 5, Alimta  500 mg/m, and Keytruda  200 mg IV every 3 weeks. She is status post 24 cycles.  Starting from cycle #5 she was on maintenance treatment with Alimta  and Keytruda .  Starting after cycle #6 Keytruda  was discontinued from the care plan due to suspicious immunotherapy mediated pneumonitis.  Therefore, she is currently undergoing single agent chemotherapy with Alimta  IV every 3 weeks.    Labs were reviewed. Recommend she proceed with cycle #25 today as scheduled. She is ok to treat with her pulse of ***, we will recheck her pulse in infusion. She had EKG at her last appointment which showed sinus tachycardia with pulse of 105.     We will see her back for follow-up visit in 3 weeks for evaluation and repeat blood work before undergoing cycle #26    She has standing orders for TSH and T4. Her results are pending from today. ***   She recently had thoracentesis and notes improvement in her breathing, although she did not realize she was symptomatic before. She continues to how moderate effusion. We will monitor for now but she knows to contact us  when she would like to move forward with repeat thoracentesis. ***  The patient was advised to call immediately if she has any concerning symptoms in the interval. The patient voices understanding of  current disease status and treatment options and is in agreement with the current care plan. All questions were answered. The patient knows to call the clinic with any problems, questions or concerns. We can certainly see the patient much sooner if necessary   No orders of the defined types were placed in this encounter.    I spent {CHL ONC TIME VISIT - DTPQU:[id number]} counseling the patient face to face. The total time spent in the appointment was {CHL ONC TIME VISIT - DTPQU:[id number]}.  Amalee Olsen L Derald Lorge, PA-C 04/30/24

## 2024-05-05 ENCOUNTER — Inpatient Hospital Stay

## 2024-05-05 ENCOUNTER — Inpatient Hospital Stay: Admitting: Physician Assistant

## 2024-05-05 ENCOUNTER — Telehealth: Payer: Self-pay | Admitting: Physician Assistant

## 2024-05-05 ENCOUNTER — Other Ambulatory Visit: Payer: Self-pay

## 2024-05-05 ENCOUNTER — Ambulatory Visit (HOSPITAL_COMMUNITY)
Admission: RE | Admit: 2024-05-05 | Discharge: 2024-05-05 | Disposition: A | Source: Ambulatory Visit | Attending: Physician Assistant

## 2024-05-05 VITALS — BP 125/84 | HR 115 | Temp 97.3°F | Resp 16 | Ht 63.0 in | Wt 151.0 lb

## 2024-05-05 DIAGNOSIS — J90 Pleural effusion, not elsewhere classified: Secondary | ICD-10-CM | POA: Diagnosis not present

## 2024-05-05 DIAGNOSIS — C3491 Malignant neoplasm of unspecified part of right bronchus or lung: Secondary | ICD-10-CM

## 2024-05-05 DIAGNOSIS — I517 Cardiomegaly: Secondary | ICD-10-CM | POA: Diagnosis not present

## 2024-05-05 DIAGNOSIS — C3490 Malignant neoplasm of unspecified part of unspecified bronchus or lung: Secondary | ICD-10-CM | POA: Diagnosis not present

## 2024-05-05 DIAGNOSIS — J9811 Atelectasis: Secondary | ICD-10-CM | POA: Diagnosis not present

## 2024-05-05 DIAGNOSIS — Z5111 Encounter for antineoplastic chemotherapy: Secondary | ICD-10-CM | POA: Diagnosis not present

## 2024-05-05 LAB — CMP (CANCER CENTER ONLY)
ALT: 10 U/L (ref 0–44)
AST: 24 U/L (ref 15–41)
Albumin: 4.4 g/dL (ref 3.5–5.0)
Alkaline Phosphatase: 87 U/L (ref 38–126)
Anion gap: 12 (ref 5–15)
BUN: 15 mg/dL (ref 8–23)
CO2: 26 mmol/L (ref 22–32)
Calcium: 9.7 mg/dL (ref 8.9–10.3)
Chloride: 104 mmol/L (ref 98–111)
Creatinine: 0.69 mg/dL (ref 0.44–1.00)
GFR, Estimated: >60 mL/min (ref >60)
Glucose, Bld: 99 mg/dL (ref 70–99)
Potassium: 3.6 mmol/L (ref 3.5–5.1)
Sodium: 141 mmol/L (ref 135–145)
Total Bilirubin: 0.5 mg/dL (ref 0.0–1.2)
Total Protein: 7.2 g/dL (ref 6.5–8.1)

## 2024-05-05 LAB — CBC WITH DIFFERENTIAL (CANCER CENTER ONLY)
Abs Immature Granulocytes: 0.01 K/uL (ref 0.00–0.07)
Basophils Absolute: 0 K/uL (ref 0.0–0.1)
Basophils Relative: 1 %
Eosinophils Absolute: 0.2 K/uL (ref 0.0–0.5)
Eosinophils Relative: 3 %
HCT: 42.3 % (ref 36.0–46.0)
Hemoglobin: 13.9 g/dL (ref 12.0–15.0)
Immature Granulocytes: 0 %
Lymphocytes Relative: 14 %
Lymphs Abs: 0.7 K/uL (ref 0.7–4.0)
MCH: 29.8 pg (ref 26.0–34.0)
MCHC: 32.9 g/dL (ref 30.0–36.0)
MCV: 90.8 fL (ref 80.0–100.0)
Monocytes Absolute: 0.4 K/uL (ref 0.1–1.0)
Monocytes Relative: 8 %
Neutro Abs: 3.5 K/uL (ref 1.7–7.7)
Neutrophils Relative %: 74 %
Platelet Count: 244 K/uL (ref 150–400)
RBC: 4.66 MIL/uL (ref 3.87–5.11)
RDW: 14.2 % (ref 11.5–15.5)
WBC Count: 4.8 K/uL (ref 4.0–10.5)
nRBC: 0 % (ref 0.0–0.2)

## 2024-05-05 MED ORDER — SODIUM CHLORIDE 0.9 % IV SOLN: Freq: Once | INTRAVENOUS | Status: AC

## 2024-05-05 MED ORDER — PROCHLORPERAZINE MALEATE 10 MG PO TABS
10.0000 mg | ORAL_TABLET | Freq: Once | ORAL | Status: AC
  Administered 2024-05-05: 10 mg via ORAL
  Filled 2024-05-05: qty 1

## 2024-05-05 MED ORDER — DEXAMETHASONE SODIUM PHOSPHATE 10 MG/ML IJ SOLN
10.0000 mg | Freq: Once | INTRAMUSCULAR | Status: AC
  Administered 2024-05-05: 10 mg via INTRAVENOUS

## 2024-05-05 MED ORDER — SODIUM CHLORIDE 0.9 % IV SOLN
500.0000 mg/m2 | Freq: Once | INTRAVENOUS | Status: AC
  Administered 2024-05-05: 900 mg via INTRAVENOUS
  Filled 2024-05-05: qty 20

## 2024-05-05 NOTE — Patient Instructions (Signed)
 [facility] - A DEPT OF [facility]  Discharge Instructions: Thank you for choosing [facility] to provide your oncology and hematology care.   If you have a lab appointment with the Cancer Center, please go directly to the Cancer Center and check in at the registration area.   Wear comfortable clothing and clothing appropriate for easy access to any Portacath or PICC line.   We strive to give you quality time with your provider. You may need to reschedule your appointment if you arrive late (15 or more minutes).  Arriving late affects you and other patients whose appointments are after yours.  Also, if you miss three or more appointments without notifying the office, you may be dismissed from the clinic at the provider's discretion.      For prescription refill requests, have your pharmacy contact our office and allow 72 hours for refills to be completed.    Today you received the following chemotherapy and/or immunotherapy agents: PEMEtrexed  Disodium (ALIMTA )      To help prevent nausea and vomiting after your treatment, we encourage you to take your nausea medication as directed.  BELOW ARE SYMPTOMS THAT SHOULD BE REPORTED IMMEDIATELY: *FEVER GREATER THAN 100.4 F (38 C) OR HIGHER *CHILLS OR SWEATING *NAUSEA AND VOMITING THAT IS NOT CONTROLLED WITH YOUR NAUSEA MEDICATION *UNUSUAL SHORTNESS OF BREATH *UNUSUAL BRUISING OR BLEEDING *URINARY PROBLEMS (pain or burning when urinating, or frequent urination) *BOWEL PROBLEMS (unusual diarrhea, constipation, pain near the anus) TENDERNESS IN MOUTH AND THROAT WITH OR WITHOUT PRESENCE OF ULCERS (sore throat, sores in mouth, or a toothache) UNUSUAL RASH, SWELLING OR PAIN  UNUSUAL VAGINAL DISCHARGE OR ITCHING   Items with * indicate a potential emergency and should be followed up as soon as possible or go to the Emergency Department if any problems should occur.  Please show the CHEMOTHERAPY ALERT  CARD or IMMUNOTHERAPY ALERT CARD at check-in to the Emergency Department and triage nurse.  Should you have questions after your visit or need to cancel or reschedule your appointment, please contact [facility] - A DEPT OF [facility]  Dept: [phone]  and follow the prompts.  Office hours are 8:00 a.m. to 4:30 p.m. Monday - Friday. Please note that voicemails left after 4:00 p.m. may not be returned until the following business day.  We are closed weekends and major holidays. You have access to a nurse at all times for urgent questions. Please call the main number to the clinic Dept: [phone] and follow the prompts.   For any non-urgent questions, you may also contact your provider using MyChart. We now offer e-Visits for anyone 8 and older to request care online for non-urgent symptoms. For details visit [email or web address].   Also download the MyChart app! Go to the app store, search MyChart, open the app, select [facility], and log in with your MyChart username and password.

## 2024-05-05 NOTE — Telephone Encounter (Signed)
 I called the patient to review her CXR. We discussed thoracentesis. After discussion, the patient would like to wait. She knows if she develops worsening symptoms to call us  and we can arrange for thoracentesis.

## 2024-05-08 ENCOUNTER — Other Ambulatory Visit: Payer: Self-pay

## 2024-05-14 ENCOUNTER — Other Ambulatory Visit: Payer: Self-pay

## 2024-05-22 ENCOUNTER — Other Ambulatory Visit: Payer: Self-pay | Admitting: Internal Medicine

## 2024-05-22 DIAGNOSIS — C3491 Malignant neoplasm of unspecified part of right bronchus or lung: Secondary | ICD-10-CM

## 2024-05-23 ENCOUNTER — Encounter: Payer: Self-pay | Admitting: Internal Medicine

## 2024-05-24 ENCOUNTER — Other Ambulatory Visit: Payer: Self-pay

## 2024-05-26 ENCOUNTER — Inpatient Hospital Stay: Attending: Internal Medicine

## 2024-05-26 ENCOUNTER — Inpatient Hospital Stay: Admitting: Internal Medicine

## 2024-05-26 ENCOUNTER — Ambulatory Visit (HOSPITAL_COMMUNITY)
Admission: RE | Admit: 2024-05-26 | Discharge: 2024-05-26 | Disposition: A | Discharge status: Home / Self Care | Source: Ambulatory Visit | Attending: Internal Medicine | Admitting: Internal Medicine

## 2024-05-26 VITALS — BP 122/74 | HR 120 | Temp 97.9°F | Resp 17 | Ht 63.0 in | Wt 156.8 lb

## 2024-05-26 VITALS — HR 105

## 2024-05-26 DIAGNOSIS — C787 Secondary malignant neoplasm of liver and intrahepatic bile duct: Secondary | ICD-10-CM | POA: Diagnosis present

## 2024-05-26 DIAGNOSIS — Z5111 Encounter for antineoplastic chemotherapy: Secondary | ICD-10-CM | POA: Diagnosis present

## 2024-05-26 DIAGNOSIS — C3431 Malignant neoplasm of lower lobe, right bronchus or lung: Secondary | ICD-10-CM | POA: Insufficient documentation

## 2024-05-26 DIAGNOSIS — C7951 Secondary malignant neoplasm of bone: Secondary | ICD-10-CM | POA: Diagnosis not present

## 2024-05-26 DIAGNOSIS — C3491 Malignant neoplasm of unspecified part of right bronchus or lung: Secondary | ICD-10-CM

## 2024-05-26 DIAGNOSIS — J910 Malignant pleural effusion: Secondary | ICD-10-CM | POA: Diagnosis not present

## 2024-05-26 LAB — CMP (CANCER CENTER ONLY)
ALT: 13 U/L (ref 0–44)
AST: 26 U/L (ref 15–41)
Albumin: 4.4 g/dL (ref 3.5–5.0)
Alkaline Phosphatase: 93 U/L (ref 38–126)
Anion gap: 12 (ref 5–15)
BUN: 10 mg/dL (ref 8–23)
CO2: 28 mmol/L (ref 22–32)
Calcium: 9.5 mg/dL (ref 8.9–10.3)
Chloride: 102 mmol/L (ref 98–111)
Creatinine: 0.77 mg/dL (ref 0.44–1.00)
GFR, Estimated: >60 mL/min (ref >60)
Glucose, Bld: 137 mg/dL — ABNORMAL HIGH (ref 70–99)
Potassium: 3.6 mmol/L (ref 3.5–5.1)
Sodium: 142 mmol/L (ref 135–145)
Total Bilirubin: 0.5 mg/dL (ref 0.0–1.2)
Total Protein: 7.4 g/dL (ref 6.5–8.1)

## 2024-05-26 LAB — CBC WITH DIFFERENTIAL (CANCER CENTER ONLY)
Abs Immature Granulocytes: 0.01 K/uL (ref 0.00–0.07)
Basophils Absolute: 0 K/uL (ref 0.0–0.1)
Basophils Relative: 1 %
Eosinophils Absolute: 0.1 K/uL (ref 0.0–0.5)
Eosinophils Relative: 2 %
HCT: 43.1 % (ref 36.0–46.0)
Hemoglobin: 13.9 g/dL (ref 12.0–15.0)
Immature Granulocytes: 0 %
Lymphocytes Relative: 9 %
Lymphs Abs: 0.5 K/uL — ABNORMAL LOW (ref 0.7–4.0)
MCH: 29.7 pg (ref 26.0–34.0)
MCHC: 32.3 g/dL (ref 30.0–36.0)
MCV: 92.1 fL (ref 80.0–100.0)
Monocytes Absolute: 0.4 K/uL (ref 0.1–1.0)
Monocytes Relative: 7 %
Neutro Abs: 4.7 K/uL (ref 1.7–7.7)
Neutrophils Relative %: 81 %
Platelet Count: 270 K/uL (ref 150–400)
RBC: 4.68 MIL/uL (ref 3.87–5.11)
RDW: 14.6 % (ref 11.5–15.5)
WBC Count: 5.8 K/uL (ref 4.0–10.5)
nRBC: 0 % (ref 0.0–0.2)

## 2024-05-26 MED ORDER — SODIUM CHLORIDE 0.9 % IV SOLN
500.0000 mg/m2 | Freq: Once | INTRAVENOUS | Status: AC
  Administered 2024-05-26: 12:00:00 900 mg via INTRAVENOUS
  Filled 2024-05-26: qty 20

## 2024-05-26 MED ORDER — DEXAMETHASONE SODIUM PHOSPHATE 10 MG/ML IJ SOLN
10.0000 mg | Freq: Once | INTRAMUSCULAR | Status: AC
  Administered 2024-05-26: 12:00:00 10 mg via INTRAVENOUS

## 2024-05-26 MED ORDER — SODIUM CHLORIDE 0.9 % IV SOLN: Freq: Once | INTRAVENOUS | Status: AC

## 2024-05-26 MED ORDER — PROCHLORPERAZINE MALEATE 10 MG PO TABS
10.0000 mg | ORAL_TABLET | Freq: Once | ORAL | Status: AC
  Administered 2024-05-26: 12:00:00 10 mg via ORAL
  Filled 2024-05-26: qty 1

## 2024-05-26 NOTE — Patient Instructions (Signed)
 [facility] - A DEPT OF [facility]  Discharge Instructions: Thank you for choosing [facility] to provide your oncology and hematology care.   If you have a lab appointment with the Cancer Center, please go directly to the Cancer Center and check in at the registration area.   Wear comfortable clothing and clothing appropriate for easy access to any Portacath or PICC line.   We strive to give you quality time with your provider. You may need to reschedule your appointment if you arrive late (15 or more minutes).  Arriving late affects you and other patients whose appointments are after yours.  Also, if you miss three or more appointments without notifying the office, you may be dismissed from the clinic at the provider's discretion.      For prescription refill requests, have your pharmacy contact our office and allow 72 hours for refills to be completed.    Today you received the following chemotherapy and/or immunotherapy agents: PEMEtrexed  Disodium (ALIMTA )      To help prevent nausea and vomiting after your treatment, we encourage you to take your nausea medication as directed.  BELOW ARE SYMPTOMS THAT SHOULD BE REPORTED IMMEDIATELY: *FEVER GREATER THAN 100.4 F (38 C) OR HIGHER *CHILLS OR SWEATING *NAUSEA AND VOMITING THAT IS NOT CONTROLLED WITH YOUR NAUSEA MEDICATION *UNUSUAL SHORTNESS OF BREATH *UNUSUAL BRUISING OR BLEEDING *URINARY PROBLEMS (pain or burning when urinating, or frequent urination) *BOWEL PROBLEMS (unusual diarrhea, constipation, pain near the anus) TENDERNESS IN MOUTH AND THROAT WITH OR WITHOUT PRESENCE OF ULCERS (sore throat, sores in mouth, or a toothache) UNUSUAL RASH, SWELLING OR PAIN  UNUSUAL VAGINAL DISCHARGE OR ITCHING   Items with * indicate a potential emergency and should be followed up as soon as possible or go to the Emergency Department if any problems should occur.  Please show the CHEMOTHERAPY ALERT  CARD or IMMUNOTHERAPY ALERT CARD at check-in to the Emergency Department and triage nurse.  Should you have questions after your visit or need to cancel or reschedule your appointment, please contact [facility] - A DEPT OF [facility]  Dept: [phone]  and follow the prompts.  Office hours are 8:00 a.m. to 4:30 p.m. Monday - Friday. Please note that voicemails left after 4:00 p.m. may not be returned until the following business day.  We are closed weekends and major holidays. You have access to a nurse at all times for urgent questions. Please call the main number to the clinic Dept: [phone] and follow the prompts.   For any non-urgent questions, you may also contact your provider using MyChart. We now offer e-Visits for anyone 8 and older to request care online for non-urgent symptoms. For details visit [email or web address].   Also download the MyChart app! Go to the app store, search MyChart, open the app, select [facility], and log in with your MyChart username and password.

## 2024-05-26 NOTE — Progress Notes (Signed)
 SATURATION QUALIFICATIONS: (This note is used to comply with regulatory documentation for home oxygen )  Patient Saturations on Room Air at Rest = 88%  Patient Saturations on Room Air while Ambulating = 78%  Patient Saturations on 2 Liters of oxygen  while Ambulating = 93%  Please briefly explain why patient needs home oxygen : no other means to increase pts oxygen 

## 2024-05-26 NOTE — Progress Notes (Signed)
 [facility] [phone]   [phone]  OFFICE PROGRESS NOTE  Stamey, Hayley POUR, FNP [address] [place] [address]  DIAGNOSIS: Stage IV (T1b, N2, M1 C) non-small cell lung cancer, adenocarcinoma presented with right lower lobe lung nodule in addition to small bilateral pulmonary nodules and right hilar and mediastinal lymphadenopathy in addition to liver and bone metastasis diagnosed in April 2024. 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   PDL1: 20%   Guardant 360: no actionable mutations.    PRIOR THERAPY: 1) Decompressive thoracic laminectomy of the T7 with spinal cord decompression and biopsy of the epidural mass in April 2024.  2) Palliative radiation to the bone under the care of Dr. Izell. Last day scheduled for 10/25/22    CURRENT THERAPY: Carboplatin  for an AUC of 5, Alimta  500 mg/m, Keytruda  200 mg IV every 3 weeks.  First dose expected on 10/28/2022. Status post 25 cycles.  Starting from cycle #5 the patient is on maintenance treatment with Alimta  and Keytruda  every 3 weeks.  Starting from cycle #7 the patient will be treated with single agent Alimta  500 Mg/M2 every 3 weeks.  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  INTERVAL HISTORY: Hayley Wu 73 y.o. female returns to the clinic today for follow-up visit accompanied by her sister.Discussed the use of AI scribe software for clinical note transcription with the patient, who gave verbal consent to proceed.  History of Present Illness Hayley Wu is a 73 year old female with stage IV non-small cell lung adenocarcinoma and recurrent malignant pleural effusion who presents for pre-chemotherapy evaluation.  She was diagnosed with stage IV non-small cell lung adenocarcinoma (adenocarcinoma subtype, PD-L1 20%, no actionable  mutations) in April 2024 and has completed 25 cycles of palliative systemic chemotherapy, initially with carboplatin  and Alimta . Her disease course has been complicated by recurrent malignant right-sided pleural effusions, previously drained, with moderate residual fluid noted on her most recent imaging (October 29).  Over the past three to four days, she has experienced increased dyspnea. Her sister corroborates the recent worsening of dyspnea and notes decreased oral intake. She recalls a chest x-ray performed three weeks ago and reports that her oxygen  saturation monitoring device was not functioning.  She describes persistent chills and shivering, feeling cold consistently, which she attributes to anticoagulation therapy with Eliquis . She denies symptoms of anemia, and her hemoglobin has remained stable.     MEDICAL HISTORY: Past Medical History:  Diagnosis Date   Allergy    Contact lens/glasses fitting    Hypertension     ALLERGIES:  is allergic to erythromycin, amoxicillin, latex, and prednisone .  MEDICATIONS:  Current Outpatient Medications  Medication Sig Dispense Refill   acetaminophen  (TYLENOL ) 500 MG tablet Take 500 mg by mouth every 6 (six) hours as needed for moderate pain.     amLODipine  (NORVASC ) 10 MG tablet Take 10 mg by mouth daily.     apixaban  (ELIQUIS ) 5 MG TABS tablet Take 1 tablet (5 mg total) by mouth 2 (two) times daily. 60 tablet 2   folic acid  (FOLVITE ) 1 MG tablet Take 1 tablet (1 mg total) by mouth daily. 90 tablet 0   Ipratropium-Albuterol  (COMBIVENT ) 20-100 MCG/ACT AERS respimat Inhale 1 puff into the lungs every 6 (six) hours as needed for wheezing or shortness of breath. 4 g 1  levothyroxine  (SYNTHROID ) 150 MCG tablet Take 150 mcg by mouth daily before breakfast.     lidocaine -prilocaine  (EMLA ) cream Apply 1 Application topically as needed. 30 g 2   mirtazapine  (REMERON ) 30 MG tablet TAKE 1 TABLET BY MOUTH AT BEDTIME 90 tablet 0   prochlorperazine   (COMPAZINE ) 10 MG tablet Take 1 tablet (10 mg total) by mouth every 6 (six) hours as needed. 30 tablet 2   VITAMIN D PO Take 1 tablet by mouth daily.     No current facility-administered medications for this visit.    SURGICAL HISTORY:  Past Surgical History:  Procedure Laterality Date   ABDOMINAL HYSTERECTOMY     BREAST SURGERY  2010   rt lumpectomy-neg   IR IMAGING GUIDED PORT INSERTION  12/18/2022   LAMINECTOMY N/A 09/21/2022   Procedure: THORACIC LAMINECTOMY FOR TUMOR;  Surgeon: Joshua Alm RAMAN, MD;  Location: [facility] OR;  Service: Neurosurgery;  Laterality: N/A;   OPEN REDUCTION INTERNAL FIXATION (ORIF) DISTAL RADIAL FRACTURE Left 11/26/2013   Procedure: OPEN REDUCTION INTERNAL FIXATION (ORIF) LEFT  DISTAL RADIUS ;  Surgeon: Dempsey JINNY Sensor, MD;  Location: [facility];  Service: Orthopedics;  Laterality: Left;  with block   TUBAL LIGATION      REVIEW OF SYSTEMS:  Constitutional: positive for fatigue Eyes: negative Ears, nose, mouth, throat, and face: negative Respiratory: positive for dyspnea on exertion Cardiovascular: negative Gastrointestinal: negative Genitourinary:negative Integument/breast: negative Hematologic/lymphatic: negative Musculoskeletal:negative Neurological: negative Behavioral/Psych: negative Endocrine: negative Allergic/Immunologic: negative   PHYSICAL EXAMINATION: General appearance: alert, cooperative, fatigued, and no distress Head: Normocephalic, without obvious abnormality, atraumatic Neck: no adenopathy, no JVD, supple, symmetrical, trachea midline, and thyroid  not enlarged, symmetric, no tenderness/mass/nodules Lymph nodes: Cervical, supraclavicular, and axillary nodes normal. Resp: clear to auscultation bilaterally Back: symmetric, no curvature. ROM normal. No CVA tenderness. Cardio: regular rate and rhythm, S1, S2 normal, no murmur, click, rub or gallop GI: soft, non-tender; bowel sounds normal; no masses,  no organomegaly Extremities:  extremities normal, atraumatic, no cyanosis or edema Neurologic: Alert and oriented X 3, normal strength and tone. Normal symmetric reflexes. Normal coordination and gait  ECOG PERFORMANCE STATUS: 1 - Symptomatic but completely ambulatory  Blood pressure 122/74, pulse (!) 120, temperature 97.9 F (36.6 C), temperature source Temporal, resp. rate 17, height 5' 3 (1.6 m), weight 156 lb 12.8 oz (71.1 kg), SpO2 (!) 88%.  LABORATORY DATA: Lab Results  Component Value Date   WBC 5.8 05/26/2024   HGB 13.9 05/26/2024   HCT 43.1 05/26/2024   MCV 92.1 05/26/2024   PLT 270 05/26/2024      Chemistry      Component Value Date/Time   NA 141 05/05/2024 0928   K 3.6 05/05/2024 0928   CL 104 05/05/2024 0928   CO2 26 05/05/2024 0928   BUN 15 05/05/2024 0928   CREATININE 0.69 05/05/2024 0928      Component Value Date/Time   CALCIUM 9.7 05/05/2024 0928   ALKPHOS 87 05/05/2024 0928   AST 24 05/05/2024 0928   ALT 10 05/05/2024 0928   BILITOT 0.5 05/05/2024 0928       RADIOGRAPHIC STUDIES: DG Chest 2 View Result Date: 05/05/2024 EXAM: 2 VIEW(S) XRAY OF THE CHEST 05/05/2024 12:06:00 PM COMPARISON: 03/29/2024 CLINICAL HISTORY: Right pleural effusion/lung cancer FINDINGS: LINES, TUBES AND DEVICES: Right chest wall porta catheter stable in place with tip overlying the superior vena cava/right atrial junction. LUNGS AND PLEURA: Interval increase in now small right pleural effusion. Associated compressive atelectasis in right lung base.  HEART AND MEDIASTINUM: Mild cardiomegaly. BONES AND SOFT TISSUES: High-grade compression fracture of mid-thoracic spine vertebral body with mild-to-moderate anterior wedging of lower thoracic spine vertebral body. IMPRESSION: 1. Interval increase in now small right pleural effusion with associated compressive atelectasis in the right lung base. 2. Mild cardiomegaly. 3. Thoracic and vertebral compression fractures, as described above . Electronically signed by: Norleen Kil  MD 05/05/2024 12:53 PM EST RP Workstation: [id number]      ASSESSMENT AND PLAN: This is a very pleasant 73 years old white female with Stage IV (T1b, N2, M1 C) non-small cell lung cancer, adenocarcinoma presented with right lower lobe lung nodule in addition to small bilateral pulmonary nodules and right hilar and mediastinal lymphadenopathy in addition to liver and bone metastasis diagnosed in April 2024. 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 Molecular studies showed no actionable mutations and PD-L1 expression of 20%. 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  She also had palliative radiation to the bone under the care of Dr. Izell. Last day scheduled for 10/25/22. She is currently on palliative systemic chemotherapy with carboplatin  for AUC of 5, Alimta  500 Mg/M2 and Keytruda  200 Mg IV every 3 weeks status post 25 cycles.  Starting from cycle #5 the patient is on maintenance treatment with Alimta  and Keytruda  every 3 weeks.  After cycle #6 Keytruda  was discontinued secondary to suspicious immunotherapy mediated pneumonitis with recent hospitalization and acute respiratory failure. She continues to tolerate her treatment with single agent Alimta  fairly well. Assessment and Plan Assessment & Plan Stage IV non-small cell lung adenocarcinoma Metastatic lung adenocarcinoma diagnosed April 2024, negative for actionable mutations, with PD-L1 expression of 20%. She has received 25 cycles of palliative systemic chemotherapy, initially with carboplatin  and then switched to pemetrexed , and remains clinically well-managed with laboratory values appropriate for continued therapy. No new acute oncologic complications identified. - Scheduled follow-up in three weeks.  Malignant pleural  effusion Recurrent right-sided malignant pleural effusion with increased dyspnea over the past three to four days, suggesting possible re-accumulation of pleural fluid. Last chest radiograph three weeks ago; most recent imaging in late October showed moderate right-sided effusion. She has required prior drainage for symptomatic relief. - Ordered chest radiograph post-chemotherapy to evaluate for pleural fluid. - If significant effusion is present, arrange pleural drainage. - Monitored oxygen  saturation prior to discharge. The patient was advised to call immediately if she has any other concerning symptoms in the interval. The patient voices understanding of current disease status and treatment options and is in agreement with the current care plan.  All questions were answered. The patient knows to call the clinic with any problems, questions or concerns. We can certainly see the patient much sooner if necessary.  The total time spent in the appointment was 30 minutes including review of chart and various tests results, discussions about plan of care and coordination of care plan .   Disclaimer: This note was dictated with voice recognition software. Similar sounding words can inadvertently be transcribed and may not be corrected upon review.

## 2024-05-26 NOTE — Addendum Note (Signed)
 Addended by: LANDY BRUNET A on: 05/26/2024 01:17 PM   Modules accepted: Orders

## 2024-06-05 ENCOUNTER — Encounter (HOSPITAL_COMMUNITY): Payer: Self-pay

## 2024-06-05 ENCOUNTER — Ambulatory Visit (HOSPITAL_COMMUNITY): Payer: Self-pay | Admitting: Family Medicine

## 2024-06-05 ENCOUNTER — Ambulatory Visit (HOSPITAL_COMMUNITY)

## 2024-06-05 ENCOUNTER — Ambulatory Visit (HOSPITAL_COMMUNITY)
Admission: EM | Admit: 2024-06-05 | Discharge: 2024-06-05 | Disposition: A | Discharge status: Home / Self Care | Attending: Family Medicine | Admitting: Family Medicine

## 2024-06-05 DIAGNOSIS — J90 Pleural effusion, not elsewhere classified: Secondary | ICD-10-CM

## 2024-06-05 DIAGNOSIS — R0600 Dyspnea, unspecified: Secondary | ICD-10-CM | POA: Diagnosis present

## 2024-06-05 DIAGNOSIS — J069 Acute upper respiratory infection, unspecified: Secondary | ICD-10-CM | POA: Diagnosis present

## 2024-06-05 LAB — CBC WITH DIFFERENTIAL/PLATELET
Abs Immature Granulocytes: 0.01 K/uL (ref 0.00–0.07)
Basophils Absolute: 0 K/uL (ref 0.0–0.1)
Basophils Relative: 1 %
Eosinophils Absolute: 0.1 K/uL (ref 0.0–0.5)
Eosinophils Relative: 4 %
HCT: 40.1 % (ref 36.0–46.0)
Hemoglobin: 13.1 g/dL (ref 12.0–15.0)
Immature Granulocytes: 1 %
Lymphocytes Relative: 21 %
Lymphs Abs: 0.4 K/uL — ABNORMAL LOW (ref 0.7–4.0)
MCH: 30.4 pg (ref 26.0–34.0)
MCHC: 32.7 g/dL (ref 30.0–36.0)
MCV: 93 fL (ref 80.0–100.0)
Monocytes Absolute: 0.4 K/uL (ref 0.1–1.0)
Monocytes Relative: 21 %
Neutro Abs: 1.1 K/uL — ABNORMAL LOW (ref 1.7–7.7)
Neutrophils Relative %: 52 %
Platelets: 96 K/uL — ABNORMAL LOW (ref 150–400)
RBC: 4.31 MIL/uL (ref 3.87–5.11)
RDW: 13.3 % (ref 11.5–15.5)
WBC: 2.1 K/uL — ABNORMAL LOW (ref 4.0–10.5)
nRBC: 0 % (ref 0.0–0.2)

## 2024-06-05 LAB — COMPREHENSIVE METABOLIC PANEL WITH GFR
ALT: 13 U/L (ref 0–44)
AST: 25 U/L (ref 15–41)
Albumin: 4.1 g/dL (ref 3.5–5.0)
Alkaline Phosphatase: 82 U/L (ref 38–126)
Anion gap: 10 (ref 5–15)
BUN: 9 mg/dL (ref 8–23)
CO2: 32 mmol/L (ref 22–32)
Calcium: 9.5 mg/dL (ref 8.9–10.3)
Chloride: 97 mmol/L — ABNORMAL LOW (ref 98–111)
Creatinine, Ser: 0.62 mg/dL (ref 0.44–1.00)
GFR, Estimated: >60 mL/min
Glucose, Bld: 101 mg/dL — ABNORMAL HIGH (ref 70–99)
Potassium: 3.6 mmol/L (ref 3.5–5.1)
Sodium: 139 mmol/L (ref 135–145)
Total Bilirubin: 0.5 mg/dL (ref 0.0–1.2)
Total Protein: 7 g/dL (ref 6.5–8.1)

## 2024-06-05 LAB — POC SOFIA SARS ANTIGEN FIA: SARS Coronavirus 2 Ag: NEGATIVE

## 2024-06-05 LAB — POCT INFLUENZA A/B
Influenza A, POC: NEGATIVE
Influenza B, POC: NEGATIVE

## 2024-06-05 MED ORDER — DOXYCYCLINE HYCLATE 100 MG PO CAPS: 100.0000 mg | ORAL_CAPSULE | Freq: Two times a day (BID) | ORAL | 0 refills | Status: AC

## 2024-06-05 NOTE — ED Provider Notes (Signed)
 " [facility]    CSN: [id number] Arrival date & time: 06/05/24  1406      History   Chief Complaint Chief Complaint  Patient presents with   Cough    HPI Hayley Wu is a 73 y.o. female.    Cough  Here for nasal congestion and cough and rhinorrhea and increased fatigue and shortness of breath.  Symptoms began about December 24.  She has not had fever  She had chemotherapy for her lung cancer on December 17 and was told there was an effusion that increased in size.  She was placed on oxygen  for hypoxia within the next couple of days.  She is using 2 L home oxygen  continuously.  O2 sat has been in the 93 to 94% range most the time when she has been seen  Chest x-ray about 10 days ago showed an enlarging effusion.  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   Cardiomegaly 03/01/2023   Pleural effusion due to CHF (congestive heart failure) (HCC) 03/01/2023   Port-A-Cath in place 12/30/2022   Encounter for antineoplastic immunotherapy 11/18/2022   Encounter for antineoplastic chemotherapy 11/18/2022   Constipation 11/05/2022   Goals of care, counseling/discussion 10/21/2022   Cancer, metastatic to bone (HCC) 10/04/2022   Adenocarcinoma of right lung, stage 4 (HCC) 10/01/2022   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 09/20/2022    Hypokalemia 09/20/2022   Lung mass 09/20/2022   Hypothyroidism 09/20/2022   Cholelithiasis 09/20/2022   Lumbar spondylosis 08/26/2022   Thoracic back pain 04/04/2022   Low back pain 04/04/2022   Hypertension 09/23/2011    Past Surgical History:  Procedure Laterality Date   ABDOMINAL HYSTERECTOMY     BREAST SURGERY  2010   rt lumpectomy-neg   IR IMAGING GUIDED PORT INSERTION  12/18/2022   LAMINECTOMY N/A 09/21/2022   Procedure: THORACIC LAMINECTOMY FOR TUMOR;  Surgeon: Joshua Alm RAMAN, MD;  Location: [facility] OR;  Service: Neurosurgery;  Laterality: N/A;   OPEN REDUCTION INTERNAL FIXATION (ORIF) DISTAL RADIAL FRACTURE Left 11/26/2013   Procedure: OPEN REDUCTION INTERNAL FIXATION (ORIF) LEFT  DISTAL RADIUS ;  Surgeon: Dempsey JINNY Sensor, MD;  Location: [facility];  Service: Orthopedics;  Laterality: Left;  with block   TUBAL LIGATION      OB History   No obstetric history on file.      Home Medications    Prior to Admission medications  Medication Sig Start Date End Date Taking? Authorizing Provider  acetaminophen  (TYLENOL ) 500 MG tablet Take 500 mg by mouth every 6 (six) hours as needed for moderate pain.   Yes [provider]  amLODipine  (NORVASC ) 10 MG tablet Take 10 mg by mouth daily.   Yes [provider]  apixaban  (ELIQUIS ) 5 MG TABS tablet Take 1 tablet (5 mg total) by mouth 2 (two) times daily. 03/25/23  Yes Sebastian Toribio GAILS, MD  folic acid  (FOLVITE ) 1 MG tablet Take 1 tablet (1 mg total) by mouth daily. 02/12/24  Yes Heilingoetter, Cassandra L, PA-C  Ipratropium-Albuterol  (COMBIVENT ) 20-100 MCG/ACT AERS respimat Inhale 1 puff into the lungs every 6 (six) hours as needed for wheezing or shortness of breath. 03/25/23  Yes Sebastian Toribio GAILS, MD  levothyroxine  (SYNTHROID ) 150 MCG tablet Take 150 mcg by mouth daily before breakfast.   Yes [provider]  lidocaine -prilocaine  (EMLA ) cream Apply 1 Application topically as needed. 12/30/22  Yes  Heilingoetter, Cassandra L, PA-C  mirtazapine  (REMERON ) 30 MG tablet TAKE 1 TABLET BY MOUTH AT BEDTIME 05/23/24  Yes Sherrod Sherrod, MD  prochlorperazine  (COMPAZINE ) 10 MG tablet Take 1 tablet (10 mg total) by mouth every 6 (six) hours as needed. 01/01/24  Yes Heilingoetter, Cassandra L, PA-C  VITAMIN D PO Take 1 tablet by mouth daily.   Yes [provider]  doxycycline  (VIBRAMYCIN ) 100 MG capsule Take 1 capsule (100 mg total) by mouth 2 (two) times daily for 7 days. 06/05/24 06/12/24  Vonna Sharlet POUR, MD    Family History Family History  Problem Relation Age of Onset   Cancer Mother    Hypertension Father    Heart attack Father    Hypertension Sister    Hypertension Brother    Heart attack Brother    Cancer Daughter        thyroid    Diabetes Maternal Grandfather     Social History Social History[1]   Allergies   Erythromycin, Amoxicillin, Latex, and Prednisone    Review of Systems Review of Systems  Respiratory:  Positive for cough.      Physical Exam Triage Vital Signs ED Triage Vitals  Encounter Vitals Group     BP 06/05/24 1639 121/68     Girls Systolic BP Percentile --      Girls Diastolic BP Percentile --      Boys Systolic BP Percentile --      Boys Diastolic BP Percentile --      Pulse Rate 06/05/24 1639 (!) 105     Resp 06/05/24 1639 18     Temp 06/05/24 1639 98.1 F (36.7 C)     Temp Source 06/05/24 1639 Oral     SpO2 06/05/24 1639 91 %     Weight --      Height 06/05/24 1639 5' 3 (1.6 m)     Head Circumference --      Peak Flow --      Pain Score 06/05/24 1638 0     Pain Loc --      Pain Education --      Exclude from Growth Chart --    No data found.  Updated Vital Signs BP 121/68 (BP Location: Left Arm)   Pulse (!) 105   Temp 98.1 F (36.7 C) (Oral)   Resp 18   Ht 5' 3 (1.6 m)   SpO2 91%   BMI 27.78 kg/m   Visual Acuity Right Eye Distance:   Left Eye Distance:   Bilateral Distance:    Right Eye Near:   Left Eye  Near:    Bilateral Near:     Physical Exam Vitals reviewed.  Constitutional:      General: She is not in acute distress.    Appearance: She is not ill-appearing, toxic-appearing or diaphoretic.  HENT:     Mouth/Throat:     Mouth: Mucous membranes are moist.     Pharynx: No oropharyngeal  exudate or posterior oropharyngeal erythema.  Eyes:     Extraocular Movements: Extraocular movements intact.     Conjunctiva/sclera: Conjunctivae normal.     Pupils: Pupils are equal, round, and reactive to light.  Cardiovascular:     Rate and Rhythm: Normal rate and regular rhythm.     Heart sounds: No murmur heard. Pulmonary:     Effort: No respiratory distress.     Breath sounds: No stridor. No wheezing, rhonchi or rales.     Comments: There is decreased breath sounds on the right lower lung field.  I cannot discern any dullness. Musculoskeletal:     Cervical back: Neck supple.  Lymphadenopathy:     Cervical: No cervical adenopathy.  Skin:    Capillary Refill: Capillary refill takes less than 2 seconds.     Coloration: Skin is not jaundiced or pale.  Neurological:     General: No focal deficit present.     Mental Status: She is alert and oriented to person, place, and time.  Psychiatric:        Behavior: Behavior normal.      UC Treatments / Results  Labs (all labs ordered are listed, but only abnormal results are displayed) Labs Reviewed  CBC WITH DIFFERENTIAL/PLATELET - Abnormal; Notable for the following components:      Result Value   WBC 2.1 (*)    Platelets 96 (*)    Neutro Abs 1.1 (*)    Lymphs Abs 0.4 (*)    All other components within normal limits  COMPREHENSIVE METABOLIC PANEL WITH GFR - Abnormal; Notable for the following components:   Chloride 97 (*)    Glucose, Bld 101 (*)    All other components within normal limits  POC SOFIA SARS ANTIGEN FIA  POCT INFLUENZA A/B    EKG   Radiology DG Chest 2 View Result Date: 06/05/2024 CLINICAL DATA:  History of lung  cancer undergoing chemotherapy, presenting with cough, runny nose, congestion and fatigue for 3 days. EXAM: CHEST - 2 VIEW COMPARISON:  May 26, 2024 FINDINGS: There is stable right-sided venous Port-A-Cath positioning. The cardiac silhouette is enlarged and unchanged in size. Mild, stable diffusely increased interstitial lung markings are seen. A moderate size right-sided pleural effusion is noted with pleural fluid also seen along the lateral aspect of the mid right lung. Underlying right basilar atelectasis and/or infiltrate is suspected. Mild left basilar atelectasis is also noted. Multilevel degenerative changes are seen throughout the thoracic spine. IMPRESSION: 1. Moderate size right-sided pleural effusion with pleural fluid also seen along the lateral aspect of the mid right lung. Correlation with chest CT is recommended. 2. Underlying right basilar atelectasis and/or infiltrate. 3. Mild left basilar atelectasis. Electronically Signed   By: Suzen Dials M.D.   On: 06/05/2024 18:53    Procedures Procedures (including critical care time)  Medications Ordered in UC Medications - No data to display  Initial Impression / Assessment and Plan / UC Course  I have reviewed the triage vital signs and the nursing notes.  Pertinent labs & imaging results that were available during my care of the patient were reviewed by me and considered in my medical decision making (see chart for details).     Testing for flu and COVID was negative. Chest x-ray by my review really was not any worse than before.  Please see above report.  I have spoken with the patient and given her her blood work results and chest x-ray results, several hours after she left.  I have sent in doxycycline  for her to take for possible pneumonia since they thought there was an infiltrate underlying the effusion.  She will follow-up with her usual doctors.  She will go to emergency room if she has any fever or increasing  shortness of breath Final Clinical Impressions(s) / UC Diagnoses   Final diagnoses:  Viral URI  Dyspnea, unspecified type  Pleural effusion     Discharge Instructions      The testing for flu and COVID was negative   I reviewed the chest x-ray looks about the same as far as the amount of fluid, though it might be slightly increased.    The radiologist will also read your x-ray, and if their interpretation differs significantly from mine, and the management of your condition would change, we will call you.   We have drawn blood to check your blood counts and electrolytes.  Our staff should call if there is anything significantly abnormal on the blood work    ED Prescriptions   None    PDMP not reviewed this encounter.    [1]  Social History Tobacco Use   Smoking status: Every Day    Current packs/day: 1.00    Types: Cigarettes  Substance Use Topics   Alcohol  use: No    Comment: rare   Drug use: No     Vonna Sharlet POUR, MD 06/05/24 2122  "

## 2024-06-05 NOTE — ED Triage Notes (Signed)
 Presenting with cough, runny nose, congestion, and fatigued onset 3 days ago. Patient had chemo therapy 12/17 and was told there is fluid on the lungs. Symptoms have progressed as far as increased cough and fatigue. Patient was placed on home oxygen  1 week ago for levels in the 70s. Currently on 2L home oxygen  continuous.   Patient has not taken any meds for cough or congestion.

## 2024-06-05 NOTE — Discharge Instructions (Addendum)
 The testing for flu and COVID was negative   I reviewed the chest x-ray looks about the same as far as the amount of fluid, though it might be slightly increased.    The radiologist will also read your x-ray, and if their interpretation differs significantly from mine, and the management of your condition would change, we will call you.   We have drawn blood to check your blood counts and electrolytes.  Our staff should call if there is anything significantly abnormal on the blood work

## 2024-06-07 ENCOUNTER — Other Ambulatory Visit: Payer: Self-pay | Admitting: Internal Medicine

## 2024-06-07 ENCOUNTER — Telehealth: Payer: Self-pay

## 2024-06-07 DIAGNOSIS — C3491 Malignant neoplasm of unspecified part of right bronchus or lung: Secondary | ICD-10-CM

## 2024-06-07 NOTE — Telephone Encounter (Signed)
 Pts daughter Josette called to inform us  that pt was seen at Urgent Care for cough. Flu/covid were negative.  CXR done. Pt was started on A/B for PNA:  Daughter states pt may need to have pleural fluid drained soon.

## 2024-06-09 ENCOUNTER — Ambulatory Visit (HOSPITAL_COMMUNITY)
Admission: RE | Admit: 2024-06-09 | Discharge: 2024-06-09 | Disposition: A | Discharge status: Home / Self Care | Source: Ambulatory Visit | Attending: Internal Medicine | Admitting: Internal Medicine

## 2024-06-09 ENCOUNTER — Ambulatory Visit (HOSPITAL_COMMUNITY)
Admission: RE | Admit: 2024-06-09 | Discharge: 2024-06-09 | Disposition: A | Discharge status: Home / Self Care | Source: Ambulatory Visit

## 2024-06-09 ENCOUNTER — Other Ambulatory Visit: Payer: Self-pay | Admitting: Internal Medicine

## 2024-06-09 DIAGNOSIS — C3491 Malignant neoplasm of unspecified part of right bronchus or lung: Secondary | ICD-10-CM | POA: Insufficient documentation

## 2024-06-09 DIAGNOSIS — J90 Pleural effusion, not elsewhere classified: Secondary | ICD-10-CM | POA: Diagnosis present

## 2024-06-09 NOTE — Progress Notes (Signed)
 [facility] OFFICE PROGRESS NOTE  Stamey, [name], FNP [address] [place] [address]  DIAGNOSIS: Stage IV (T1b, N2, M1 C) non-small cell lung cancer, adenocarcinoma presented with right lower lobe lung nodule in addition to small bilateral pulmonary nodules and right hilar and mediastinal lymphadenopathy in addition to liver and bone metastasis diagnosed in April 2024. 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   PDL1: 20%  Guardant 360: no actionable mutations.   PRIOR THERAPY: 1) Decompressive thoracic laminectomy of the T7 with spinal cord decompression and biopsy of the epidural mass in April 2024.  2) Palliative radiation to the bone under the care of Dr. Izell. Last day scheduled for 10/25/22   CURRENT THERAPY: Carboplatin  for an AUC of 5, Alimta  500 mg/m, Keytruda  200 mg IV every 3 weeks.  First dose expected on 10/28/2022. Status post 26 cycles.  Starting from cycle #5 the patient is on maintenance treatment with Alimta  and Keytruda  every 3 weeks.  Starting from cycle #7 the patient will be treated with single agent Alimta  500 Mg/M2 every 3 weeks.  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   INTERVAL HISTORY: Hayley Wu 73 y.o. female returns to the clinic today for a follow-up visit.  The patient was last seen in the clinic on 05/26/2024.  She is currently on single agent Alimta . Keytruda  was discontinued due to suspicious immunotherapy mediated pneumonitis after hospitalization in September 2024.   In the interval since being seen she did go to an urgent care and treated with antibiotics for possible pneumonia due to developing cough and mild sputum production.  She was treated with doxycycline .  Her flu swab was negative.  She denies any fever, chills, night sweats, nausea, vomiting, headaches, or visual  disturbances.  She has baseline dyspnea on exertion for which she is on 2 L of supplemental oxygen .  She does have thoracentesis periodically.  She did have this performed on 06/09/2024 which obtained 1450 mL of fluid.  She did report some transient soreness which resolved and her shortness of breath did improve after the procedure.  She does continue to have variable appetite and lost a couple pounds recently.  He completed physical therapy a few months ago.  She does not always perform the exercises that she was taught at home.  She is here today for evaluation and repeat blood work before undergoing the next cycle of treatment cycle #27.  MEDICAL HISTORY: Past Medical History:  Diagnosis Date   Allergy    Contact lens/glasses fitting    Hypertension     ALLERGIES:  is allergic to erythromycin, amoxicillin, latex, and prednisone .  MEDICATIONS:  Current Outpatient Medications  Medication Sig Dispense Refill   acetaminophen  (TYLENOL ) 500 MG tablet Take 500 mg by mouth every 6 (six) hours as needed for moderate pain.     amLODipine  (NORVASC ) 10 MG tablet Take 10 mg by mouth daily.     apixaban  (ELIQUIS ) 5 MG TABS tablet Take 1 tablet (5 mg total) by mouth 2 (two) times daily. 60 tablet 2   folic acid  (FOLVITE ) 1 MG tablet Take 1 tablet (1 mg total) by mouth daily. 90 tablet 0   Ipratropium-Albuterol  (COMBIVENT ) 20-100 MCG/ACT AERS respimat Inhale 1 puff into the lungs every 6 (six) hours as needed for wheezing or shortness of breath. 4 g 1   levothyroxine  (SYNTHROID )  150 MCG tablet Take 150 mcg by mouth daily before breakfast.     lidocaine -prilocaine  (EMLA ) cream Apply 1 Application topically as needed. 30 g 2   mirtazapine  (REMERON ) 30 MG tablet TAKE 1 TABLET BY MOUTH AT BEDTIME 90 tablet 0   prochlorperazine  (COMPAZINE ) 10 MG tablet Take 1 tablet (10 mg total) by mouth every 6 (six) hours as needed. 30 tablet 2   VITAMIN D PO Take 1 tablet by mouth daily.     No current  facility-administered medications for this visit.    SURGICAL HISTORY:  Past Surgical History:  Procedure Laterality Date   ABDOMINAL HYSTERECTOMY     BREAST SURGERY  2010   rt lumpectomy-neg   IR IMAGING GUIDED PORT INSERTION  12/18/2022   LAMINECTOMY N/A 09/21/2022   Procedure: THORACIC LAMINECTOMY FOR TUMOR;  Surgeon: Joshua Alm RAMAN, MD;  Location: [facility] OR;  Service: Neurosurgery;  Laterality: N/A;   OPEN REDUCTION INTERNAL FIXATION (ORIF) DISTAL RADIAL FRACTURE Left 11/26/2013   Procedure: OPEN REDUCTION INTERNAL FIXATION (ORIF) LEFT  DISTAL RADIUS ;  Surgeon: Dempsey JINNY Sensor, MD;  Location: [facility];  Service: Orthopedics;  Laterality: Left;  with block   TUBAL LIGATION      REVIEW OF SYSTEMS:   Review of Systems  Constitutional: Positive for stable fatigue, weight loss, and stable diminished appetite. Negative for chills and fever. HENT: Negative for mouth sores, nosebleeds, sore throat and trouble swallowing.   Eyes: Negative for eye problems and icterus.  Respiratory: Positive for dyspnea on exertion. She denies significant cough today. Negative for hemoptysis, and wheezing.     Cardiovascular: Negative for chest pain and swelling.  Gastrointestinal: Negative for abdominal pain, nausea, constipation, diarrhea, and vomiting.  Genitourinary: Negative for bladder incontinence, difficulty urinating, dysuria, frequency and hematuria.   Musculoskeletal: Negative for back pain, gait problem, neck pain and neck stiffness.  Skin: Negative for itching and rash.  Neurological: Negative for dizziness, extremity weakness, gait problem, headaches, light-headedness and seizures.  Hematological: Negative for adenopathy. Does not bruise/bleed easily.  Psychiatric/Behavioral: Negative for confusion, depression and sleep disturbance. The patient is not nervous/anxious.     PHYSICAL EXAMINATION:  Blood pressure 122/69, pulse (!) 113, temperature 97.9 F (36.6 C), temperature source  Temporal, resp. rate 17, weight 152 lb 2 oz (69 kg).  ECOG PERFORMANCE STATUS: 1-2  Physical Exam  Constitutional: Oriented to person, place, and time and chronically ill appearing female, and in no distress. HENT:  Head: Normocephalic and atraumatic.  Mouth/Throat: Oropharynx is clear and moist. No oropharyngeal exudate.  Eyes: Conjunctivae are normal. Right eye exhibits no discharge. Left eye exhibits no discharge. No scleral icterus.  Neck: Normal range of motion. Neck supple.  Cardiovascular: tachycardic, regular rhythm, normal heart sounds and intact distal pulses.   Pulmonary/Chest: Effort normal. Quiet breath sounds in the right lung. No respiratory distress. No wheezes. No rales. Wears supplemental oxygen . Abdominal: Soft. Bowel sounds are normal. Exhibits no distension and no mass. There is no tenderness.  Musculoskeletal: Normal range of motion. Exhibits no edema.  Lymphadenopathy:    No cervical adenopathy.  Neurological: Alert and oriented to person, place, and time. Exhibits muscle wasting. Ambulates with a walker.  Skin: Skin is warm and dry. No rash noted. Not diaphoretic. No erythema. No pallor.  Psychiatric: Mood, memory and judgment normal.  Vitals reviewed.  LABORATORY DATA: Lab Results  Component Value Date   WBC 4.6 06/16/2024   HGB 12.8 06/16/2024   HCT 40.3 06/16/2024  MCV 92.2 06/16/2024   PLT 249 06/16/2024      Chemistry      Component Value Date/Time   NA 142 06/16/2024 1005   K 3.6 06/16/2024 1005   CL 102 06/16/2024 1005   CO2 31 06/16/2024 1005   BUN 10 06/16/2024 1005   CREATININE 0.62 06/16/2024 1005      Component Value Date/Time   CALCIUM 9.4 06/16/2024 1005   ALKPHOS 84 06/16/2024 1005   AST 23 06/16/2024 1005   ALT 8 06/16/2024 1005   BILITOT 0.5 06/16/2024 1005       RADIOGRAPHIC STUDIES:  DG Chest 1 View Result Date: 06/12/2024 CLINICAL DATA:  Lung cancer pleural effusion EXAM: CHEST  1 VIEW COMPARISON:  05/05/2024, CT  04/07/2024 FINDINGS: Right-sided central venous port tip at the cavoatrial region. Cardiomegaly. Moderate right pleural effusion increased compared to prior. Small left effusion. Emphysema and diffuse increased interstitial opacity, potentially due to mild interstitial edema. IMPRESSION: 1. Moderate right pleural effusion increased compared to prior. Small left effusion. 2. Cardiomegaly. Emphysema with diffuse increased interstitial opacity, potentially due to mild interstitial edema. Electronically Signed   By: Luke Bun M.D.   On: 06/12/2024 23:17   US  THORACENTESIS ASP PLEURAL SPACE W/IMG GUIDE Result Date: 06/09/2024 INDICATION: 73 year old female with history of stage IV right lung adenocarcinoma with recurrent pleural effusion. Received request for therapeutic thoracentesis. EXAM: ULTRASOUND GUIDED THERAPEUTIC, RIGHT-SIDED THORACENTESIS MEDICATIONS: 8 mL 1% lidocaine  COMPLICATIONS: None immediate. PROCEDURE: An ultrasound guided thoracentesis was thoroughly discussed with the patient and questions answered. The benefits, risks, alternatives and complications were also discussed. The patient understands and wishes to proceed with the procedure. Written consent was obtained. Ultrasound was performed to localize and mark an adequate pocket of fluid in the right chest. The area was then prepped and draped in the normal sterile fashion. 1% lidocaine  was used for local anesthesia. Under ultrasound guidance a 6 Fr Safe-T-Centesis catheter was introduced. Thoracentesis was performed. The catheter was removed and a dressing applied. FINDINGS: A total of approximately 1450 mL of yellow fluid was removed. IMPRESSION: Successful ultrasound guided right thoracentesis yielding 1450 mL of pleural fluid. Performed by: Kristi Davenport, NP under the supervision of Dr. Juliene Balder Electronically Signed   By: Juliene Balder M.D.   On: 06/09/2024 16:37   DG Chest 1 View Result Date: 06/09/2024 EXAM: 1 VIEW(S) XRAY OF THE  CHEST 06/09/2024 02:22:00 PM COMPARISON: 06/05/2024 CLINICAL HISTORY: S/P thoracentesis FINDINGS: LINES, TUBES AND DEVICES: Stable right IJ port catheter. LUNGS AND PLEURA: Small pleural effusion which is decreased in size compared to the prior radiograph. There is slight interval improvement in aeration of the right mid and lower lung. Possible small left pleural effusion. No focal pulmonary opacity. No pneumothorax. HEART AND MEDIASTINUM: Cardiomegaly, unchanged. BONES AND SOFT TISSUES: No acute osseous abnormality. IMPRESSION: 1. Small right pleural effusion, decreased in size compared to the prior radiograph. 2. Possible small left pleural effusion. Electronically signed by: Donnice Mania MD 06/09/2024 02:48 PM EST RP Workstation: [id number]   DG Chest 2 View Result Date: 06/05/2024 CLINICAL DATA:  History of lung cancer undergoing chemotherapy, presenting with cough, runny nose, congestion and fatigue for 3 days. EXAM: CHEST - 2 VIEW COMPARISON:  May 26, 2024 FINDINGS: There is stable right-sided venous Port-A-Cath positioning. The cardiac silhouette is enlarged and unchanged in size. Mild, stable diffusely increased interstitial lung markings are seen. A moderate size right-sided pleural effusion is noted with pleural fluid also seen along the lateral  aspect of the mid right lung. Underlying right basilar atelectasis and/or infiltrate is suspected. Mild left basilar atelectasis is also noted. Multilevel degenerative changes are seen throughout the thoracic spine. IMPRESSION: 1. Moderate size right-sided pleural effusion with pleural fluid also seen along the lateral aspect of the mid right lung. Correlation with chest CT is recommended. 2. Underlying right basilar atelectasis and/or infiltrate. 3. Mild left basilar atelectasis. Electronically Signed   By: Suzen Dials M.D.   On: 06/05/2024 18:53     ASSESSMENT/PLAN:  This is a very pleasant 73 year old Caucasian female recently diagnosed with  stage IV (T1b, N2, M1 C) non-small cell lung cancer, adenocarcinoma presented with right lower lobe lung nodule in addition to small bilateral pulmonary nodules and right hilar and mediastinal lymphadenopathy in addition to liver and bone metastasis diagnosed in April 2024. 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 Her PDL1 expression is 20%. She has no actionable mutations.    She is completed palliative radiation to the painful metastatic bone lesions under the care of Dr. Izell in the last day radiation was on 10/25/2022.    She is currently undergoing systemic chemotherapy with carboplatin  for an AUC of 5, Alimta  500 mg/m, and Keytruda  200 mg IV every 3 weeks. She is status post 26 cycles.  Starting from cycle #5 she was on maintenance treatment with Alimta  and Keytruda .  Starting after cycle #6 Keytruda  was discontinued from the care plan due to suspicious immunotherapy mediated pneumonitis.  Therefore, she is currently undergoing single agent chemotherapy with Alimta  IV every 3 weeks.   Labs were reviewed. Recommend she proceed with cycle #26 today as scheduled. She is ok to treat with her pulse of 113. She had EKG's checked which showed sinus tachycardia. I did encourage her to follow up with her PCP though.    We will see her back for follow-up visit in 3 weeks for evaluation and repeat blood work before undergoing cycle #28    She has standing orders for TSH and T4. Her results are pending from today.   I will arrange for a restaging CT scan of the CAP prior to the next cycle of treatment.   - Instructed to contact office if increased dyspnea, cough, or oxygen  desaturation occurs before scheduled scan; chest x-ray option if indicated to follow up on pleural effusion and to arrange thoracentesis if needed.   - Reinforced importance of home oxygen  saturation monitoring; advised evaluation if  levels fall below baseline.  Encouraged flu vaccination  - Encouraged continuation of home physical therapy exercises to maintain muscle mass and prevent further wasting.   The patient was advised to call immediately if she has any concerning symptoms in the interval. The patient voices understanding of current disease status and treatment options and is in agreement with the current care plan. All questions were answered. The patient knows to call the clinic with any problems, questions or concerns. We can certainly see the patient much sooner if necessary    Orders Placed This Encounter  Procedures   CT CHEST ABDOMEN PELVIS W CONTRAST    Standing Status:   Future    Expected Date:   06/30/2024    Expiration Date:   06/16/2025    If indicated for the ordered procedure, I authorize the administration of contrast media per Radiology protocol:   Yes    Does the patient have a  contrast media/X-ray dye allergy?:   No    Preferred imaging location?:   [facility]    If indicated for the ordered procedure, I authorize the administration of oral contrast media per Radiology protocol:   Yes     The total time spent in the appointment was 20-29 minutes  Timmy Cleverly L Dameir Gentzler, PA-C 06/16/2024

## 2024-06-09 NOTE — Procedures (Signed)
 PROCEDURE SUMMARY:  Successful image-guided therapeutic thoracentesis from the right chest.  Yielded 1450 mL of yellow fluid.  No immediate complications.  EBL: zero Patient tolerated well.   Specimen not sent for labs.  Post-procedure CXR ordered and reviewed by Dr. Ester Sides prior to departure from department.   Please see imaging section of Epic for full dictation.  Nasser Ku B Faizon Capozzi NP 06/09/2024 2:10 PM

## 2024-06-11 ENCOUNTER — Other Ambulatory Visit: Payer: Self-pay

## 2024-06-16 ENCOUNTER — Inpatient Hospital Stay

## 2024-06-16 ENCOUNTER — Inpatient Hospital Stay: Admitting: Physician Assistant

## 2024-06-16 ENCOUNTER — Encounter: Payer: Self-pay | Admitting: Internal Medicine

## 2024-06-16 ENCOUNTER — Inpatient Hospital Stay: Attending: Internal Medicine | Admitting: Physician Assistant

## 2024-06-16 ENCOUNTER — Inpatient Hospital Stay: Attending: Internal Medicine

## 2024-06-16 VITALS — BP 122/69 | HR 113 | Temp 97.9°F | Resp 17 | Wt 152.1 lb

## 2024-06-16 VITALS — BP 109/68 | HR 99 | Temp 98.0°F | Resp 20

## 2024-06-16 DIAGNOSIS — I3139 Other pericardial effusion (noninflammatory): Secondary | ICD-10-CM | POA: Insufficient documentation

## 2024-06-16 DIAGNOSIS — Z5111 Encounter for antineoplastic chemotherapy: Secondary | ICD-10-CM

## 2024-06-16 DIAGNOSIS — C3491 Malignant neoplasm of unspecified part of right bronchus or lung: Secondary | ICD-10-CM

## 2024-06-16 DIAGNOSIS — J910 Malignant pleural effusion: Secondary | ICD-10-CM | POA: Diagnosis not present

## 2024-06-16 DIAGNOSIS — C3431 Malignant neoplasm of lower lobe, right bronchus or lung: Secondary | ICD-10-CM | POA: Insufficient documentation

## 2024-06-16 DIAGNOSIS — C787 Secondary malignant neoplasm of liver and intrahepatic bile duct: Secondary | ICD-10-CM | POA: Diagnosis present

## 2024-06-16 DIAGNOSIS — R000 Tachycardia, unspecified: Secondary | ICD-10-CM | POA: Diagnosis not present

## 2024-06-16 DIAGNOSIS — T50995D Adverse effect of other drugs, medicaments and biological substances, subsequent encounter: Secondary | ICD-10-CM | POA: Insufficient documentation

## 2024-06-16 DIAGNOSIS — C7951 Secondary malignant neoplasm of bone: Secondary | ICD-10-CM | POA: Insufficient documentation

## 2024-06-16 LAB — CBC WITH DIFFERENTIAL (CANCER CENTER ONLY)
Abs Immature Granulocytes: 0.01 K/uL (ref 0.00–0.07)
Basophils Absolute: 0 K/uL (ref 0.0–0.1)
Basophils Relative: 1 %
Eosinophils Absolute: 0.1 K/uL (ref 0.0–0.5)
Eosinophils Relative: 3 %
HCT: 40.3 % (ref 36.0–46.0)
Hemoglobin: 12.8 g/dL (ref 12.0–15.0)
Immature Granulocytes: 0 %
Lymphocytes Relative: 11 %
Lymphs Abs: 0.5 K/uL — ABNORMAL LOW (ref 0.7–4.0)
MCH: 29.3 pg (ref 26.0–34.0)
MCHC: 31.8 g/dL (ref 30.0–36.0)
MCV: 92.2 fL (ref 80.0–100.0)
Monocytes Absolute: 0.4 K/uL (ref 0.1–1.0)
Monocytes Relative: 9 %
Neutro Abs: 3.6 K/uL (ref 1.7–7.7)
Neutrophils Relative %: 76 %
Platelet Count: 249 K/uL (ref 150–400)
RBC: 4.37 MIL/uL (ref 3.87–5.11)
RDW: 13.9 % (ref 11.5–15.5)
WBC Count: 4.6 K/uL (ref 4.0–10.5)
nRBC: 0 % (ref 0.0–0.2)

## 2024-06-16 LAB — CMP (CANCER CENTER ONLY)
ALT: 8 U/L (ref 0–44)
AST: 23 U/L (ref 15–41)
Albumin: 4.1 g/dL (ref 3.5–5.0)
Alkaline Phosphatase: 84 U/L (ref 38–126)
Anion gap: 9 (ref 5–15)
BUN: 10 mg/dL (ref 8–23)
CO2: 31 mmol/L (ref 22–32)
Calcium: 9.4 mg/dL (ref 8.9–10.3)
Chloride: 102 mmol/L (ref 98–111)
Creatinine: 0.62 mg/dL (ref 0.44–1.00)
GFR, Estimated: >60 mL/min
Glucose, Bld: 118 mg/dL — ABNORMAL HIGH (ref 70–99)
Potassium: 3.6 mmol/L (ref 3.5–5.1)
Sodium: 142 mmol/L (ref 135–145)
Total Bilirubin: 0.5 mg/dL (ref 0.0–1.2)
Total Protein: 6.8 g/dL (ref 6.5–8.1)

## 2024-06-16 LAB — TSH: TSH: 1.34 u[IU]/mL (ref 0.350–4.500)

## 2024-06-16 MED ORDER — SODIUM CHLORIDE 0.9 % IV SOLN: Freq: Once | INTRAVENOUS | Status: AC

## 2024-06-16 MED ORDER — PROCHLORPERAZINE MALEATE 10 MG PO TABS
10.0000 mg | ORAL_TABLET | Freq: Once | ORAL | Status: AC
  Administered 2024-06-16: 10 mg via ORAL
  Filled 2024-06-16: qty 1

## 2024-06-16 MED ORDER — CYANOCOBALAMIN 1000 MCG/ML IJ SOLN
1000.0000 ug | Freq: Once | INTRAMUSCULAR | Status: AC
  Administered 2024-06-16: 1000 ug via INTRAMUSCULAR
  Filled 2024-06-16: qty 1

## 2024-06-16 MED ORDER — DEXAMETHASONE SODIUM PHOSPHATE 10 MG/ML IJ SOLN
10.0000 mg | Freq: Once | INTRAMUSCULAR | Status: AC
  Administered 2024-06-16: 10 mg via INTRAVENOUS

## 2024-06-16 MED ORDER — SODIUM CHLORIDE 0.9 % IV SOLN
500.0000 mg/m2 | Freq: Once | INTRAVENOUS | Status: AC
  Administered 2024-06-16: 900 mg via INTRAVENOUS
  Filled 2024-06-16: qty 20

## 2024-06-16 NOTE — Patient Instructions (Addendum)
 [facility] - A DEPT OF [facility]  Discharge Instructions: Thank you for choosing [facility] to provide your oncology and hematology care.   If you have a lab appointment with the Cancer Center, please go directly to the Cancer Center and check in at the registration area.   Wear comfortable clothing and clothing appropriate for easy access to any Portacath or PICC line.   We strive to give you quality time with your provider. You may need to reschedule your appointment if you arrive late (15 or more minutes).  Arriving late affects you and other patients whose appointments are after yours.  Also, if you miss three or more appointments without notifying the office, you may be dismissed from the clinic at the provider's discretion.      For prescription refill requests, have your pharmacy contact our office and allow 72 hours for refills to be completed.    Today you received the following chemotherapy and/or immunotherapy agents: PEMEtrexed  Disodium (ALIMTA )      To help prevent nausea and vomiting after your treatment, we encourage you to take your nausea medication as directed.  BELOW ARE SYMPTOMS THAT SHOULD BE REPORTED IMMEDIATELY: *FEVER GREATER THAN 100.4 F (38 C) OR HIGHER *CHILLS OR SWEATING *NAUSEA AND VOMITING THAT IS NOT CONTROLLED WITH YOUR NAUSEA MEDICATION *UNUSUAL SHORTNESS OF BREATH *UNUSUAL BRUISING OR BLEEDING *URINARY PROBLEMS (pain or burning when urinating, or frequent urination) *BOWEL PROBLEMS (unusual diarrhea, constipation, pain near the anus) TENDERNESS IN MOUTH AND THROAT WITH OR WITHOUT PRESENCE OF ULCERS (sore throat, sores in mouth, or a toothache) UNUSUAL RASH, SWELLING OR PAIN  UNUSUAL VAGINAL DISCHARGE OR ITCHING   Items with * indicate a potential emergency and should be followed up as soon as possible or go to the Emergency Department if any problems should occur.  Please show the CHEMOTHERAPY ALERT  CARD or IMMUNOTHERAPY ALERT CARD at check-in to the Emergency Department and triage nurse.  Should you have questions after your visit or need to cancel or reschedule your appointment, please contact [facility] - A DEPT OF [facility]  Dept: [phone]  and follow the prompts.  Office hours are 8:00 a.m. to 4:30 p.m. Monday - Friday. Please note that voicemails left after 4:00 p.m. may not be returned until the following business day.  We are closed weekends and major holidays. You have access to a nurse at all times for urgent questions. Please call the main number to the clinic Dept: [phone] and follow the prompts.   For any non-urgent questions, you may also contact your provider using MyChart. We now offer e-Visits for anyone 8 and older to request care online for non-urgent symptoms. For details visit [email or web address].   Also download the MyChart app! Go to the app store, search MyChart, open the app, select [facility], and log in with your MyChart username and password.

## 2024-06-17 LAB — T4: T4, Total: 16.7 ug/dL — ABNORMAL HIGH (ref 4.5–12.0)

## 2024-06-19 ENCOUNTER — Other Ambulatory Visit: Payer: Self-pay

## 2024-06-23 ENCOUNTER — Other Ambulatory Visit: Payer: Self-pay

## 2024-06-27 ENCOUNTER — Ambulatory Visit (HOSPITAL_BASED_OUTPATIENT_CLINIC_OR_DEPARTMENT_OTHER)
Admission: RE | Admit: 2024-06-27 | Discharge: 2024-06-27 | Disposition: A | Discharge status: Home / Self Care | Source: Ambulatory Visit | Attending: Physician Assistant | Admitting: Physician Assistant

## 2024-06-27 DIAGNOSIS — C3491 Malignant neoplasm of unspecified part of right bronchus or lung: Secondary | ICD-10-CM | POA: Insufficient documentation

## 2024-06-27 MED ORDER — IOHEXOL 300 MG/ML  SOLN
100.0000 mL | Freq: Once | INTRAMUSCULAR | Status: AC | PRN
  Administered 2024-06-27: 100 mL via INTRAVENOUS

## 2024-07-02 ENCOUNTER — Other Ambulatory Visit: Payer: Self-pay | Admitting: Physician Assistant

## 2024-07-02 ENCOUNTER — Telehealth: Payer: Self-pay | Admitting: Physician Assistant

## 2024-07-02 NOTE — Telephone Encounter (Signed)
 I called the patient to let her know that her CT scan resulted which shows recurrent large pleural effusion.  We have put in an order for thoracentesis but may not be able to have this scheduled until early next week.  Therefore, the patient was cautioned on signs and symptoms that would warrant emergency room evaluation such as hypoxia, shortness of breath, chest discomfort, etc. She also let me know that she has supplemental oxygen  at home if needed.  She expressed understanding with the instructions.

## 2024-07-06 ENCOUNTER — Other Ambulatory Visit: Payer: Self-pay | Admitting: Physician Assistant

## 2024-07-06 ENCOUNTER — Telehealth: Payer: Self-pay

## 2024-07-06 ENCOUNTER — Other Ambulatory Visit: Payer: Self-pay

## 2024-07-06 ENCOUNTER — Ambulatory Visit (HOSPITAL_COMMUNITY)
Admission: RE | Admit: 2024-07-06 | Discharge: 2024-07-06 | Disposition: A | Source: Ambulatory Visit | Attending: Radiology | Admitting: Radiology

## 2024-07-06 ENCOUNTER — Ambulatory Visit (HOSPITAL_COMMUNITY)
Admission: RE | Admit: 2024-07-06 | Discharge: 2024-07-06 | Disposition: A | Source: Ambulatory Visit | Attending: Physician Assistant

## 2024-07-06 DIAGNOSIS — J90 Pleural effusion, not elsewhere classified: Secondary | ICD-10-CM

## 2024-07-06 MED ORDER — LIDOCAINE-EPINEPHRINE (PF) 2 %-1:200000 IJ SOLN
INTRAMUSCULAR | Status: AC
  Filled 2024-07-06: qty 20

## 2024-07-06 NOTE — Telephone Encounter (Signed)
 Spoke with patient in regards to thoracentesis.  Offered patient an appt today @ 230 PM.  She voiced understanding.

## 2024-07-06 NOTE — Procedures (Signed)
 Image guided thoracentesis therapeutic from the right pleural space. Procedure yielded 1.3L of clear straw colored fluid. No immediate complications. EBL: zero. Patient tolerated the procedure well. Chest X ray was ordered and reviewed by Dr. Luverne prior to departure from department.

## 2024-07-07 ENCOUNTER — Inpatient Hospital Stay

## 2024-07-07 ENCOUNTER — Inpatient Hospital Stay: Admitting: Physician Assistant

## 2024-07-08 ENCOUNTER — Inpatient Hospital Stay

## 2024-07-08 ENCOUNTER — Inpatient Hospital Stay: Admitting: Internal Medicine

## 2024-07-08 VITALS — HR 104

## 2024-07-08 VITALS — BP 117/78 | HR 117 | Temp 98.1°F | Resp 17 | Ht 63.0 in | Wt 150.2 lb

## 2024-07-08 DIAGNOSIS — C3491 Malignant neoplasm of unspecified part of right bronchus or lung: Secondary | ICD-10-CM

## 2024-07-08 DIAGNOSIS — Z5111 Encounter for antineoplastic chemotherapy: Secondary | ICD-10-CM | POA: Diagnosis not present

## 2024-07-08 LAB — CBC WITH DIFFERENTIAL (CANCER CENTER ONLY)
Abs Immature Granulocytes: 0.01 10*3/uL (ref 0.00–0.07)
Basophils Absolute: 0 10*3/uL (ref 0.0–0.1)
Basophils Relative: 1 %
Eosinophils Absolute: 0.1 10*3/uL (ref 0.0–0.5)
Eosinophils Relative: 2 %
HCT: 38.9 % (ref 36.0–46.0)
Hemoglobin: 12.5 g/dL (ref 12.0–15.0)
Immature Granulocytes: 0 %
Lymphocytes Relative: 9 %
Lymphs Abs: 0.4 10*3/uL — ABNORMAL LOW (ref 0.7–4.0)
MCH: 29.4 pg (ref 26.0–34.0)
MCHC: 32.1 g/dL (ref 30.0–36.0)
MCV: 91.5 fL (ref 80.0–100.0)
Monocytes Absolute: 0.4 10*3/uL (ref 0.1–1.0)
Monocytes Relative: 9 %
Neutro Abs: 3.4 10*3/uL (ref 1.7–7.7)
Neutrophils Relative %: 79 %
Platelet Count: 259 10*3/uL (ref 150–400)
RBC: 4.25 MIL/uL (ref 3.87–5.11)
RDW: 14.6 % (ref 11.5–15.5)
WBC Count: 4.3 10*3/uL (ref 4.0–10.5)
nRBC: 0 % (ref 0.0–0.2)

## 2024-07-08 LAB — CMP (CANCER CENTER ONLY)
ALT: 10 U/L (ref 0–44)
AST: 21 U/L (ref 15–41)
Albumin: 4 g/dL (ref 3.5–5.0)
Alkaline Phosphatase: 80 U/L (ref 38–126)
Anion gap: 8 (ref 5–15)
BUN: 12 mg/dL (ref 8–23)
CO2: 34 mmol/L — ABNORMAL HIGH (ref 22–32)
Calcium: 9.2 mg/dL (ref 8.9–10.3)
Chloride: 101 mmol/L (ref 98–111)
Creatinine: 0.61 mg/dL (ref 0.44–1.00)
GFR, Estimated: >60 mL/min
Glucose, Bld: 129 mg/dL — ABNORMAL HIGH (ref 70–99)
Potassium: 3.7 mmol/L (ref 3.5–5.1)
Sodium: 143 mmol/L (ref 135–145)
Total Bilirubin: 0.4 mg/dL (ref 0.0–1.2)
Total Protein: 6.6 g/dL (ref 6.5–8.1)

## 2024-07-08 MED ORDER — PROCHLORPERAZINE MALEATE 10 MG PO TABS
10.0000 mg | ORAL_TABLET | Freq: Once | ORAL | Status: AC
  Administered 2024-07-08: 10 mg via ORAL
  Filled 2024-07-08: qty 1

## 2024-07-08 MED ORDER — SODIUM CHLORIDE 0.9 % IV SOLN: Freq: Once | INTRAVENOUS | Status: AC

## 2024-07-08 MED ORDER — DEXAMETHASONE SODIUM PHOSPHATE 10 MG/ML IJ SOLN
10.0000 mg | Freq: Once | INTRAMUSCULAR | Status: AC
  Administered 2024-07-08: 10 mg via INTRAVENOUS

## 2024-07-08 MED ORDER — SODIUM CHLORIDE 0.9 % IV SOLN
500.0000 mg/m2 | Freq: Once | INTRAVENOUS | Status: AC
  Administered 2024-07-08: 900 mg via INTRAVENOUS
  Filled 2024-07-08: qty 20

## 2024-07-08 NOTE — Patient Instructions (Signed)
 [facility] - A DEPT OF [facility]  Discharge Instructions: Thank you for choosing [facility] to provide your oncology and hematology care.   If you have a lab appointment with the Cancer Center, please go directly to the Cancer Center and check in at the registration area.   Wear comfortable clothing and clothing appropriate for easy access to any Portacath or PICC line.   We strive to give you quality time with your provider. You may need to reschedule your appointment if you arrive late (15 or more minutes).  Arriving late affects you and other patients whose appointments are after yours.  Also, if you miss three or more appointments without notifying the office, you may be dismissed from the clinic at the provider's discretion.      For prescription refill requests, have your pharmacy contact our office and allow 72 hours for refills to be completed.    Today you received the following chemotherapy and/or immunotherapy agents Alimta      To help prevent nausea and vomiting after your treatment, we encourage you to take your nausea medication as directed.  BELOW ARE SYMPTOMS THAT SHOULD BE REPORTED IMMEDIATELY: *FEVER GREATER THAN 100.4 F (38 C) OR HIGHER *CHILLS OR SWEATING *NAUSEA AND VOMITING THAT IS NOT CONTROLLED WITH YOUR NAUSEA MEDICATION *UNUSUAL SHORTNESS OF BREATH *UNUSUAL BRUISING OR BLEEDING *URINARY PROBLEMS (pain or burning when urinating, or frequent urination) *BOWEL PROBLEMS (unusual diarrhea, constipation, pain near the anus) TENDERNESS IN MOUTH AND THROAT WITH OR WITHOUT PRESENCE OF ULCERS (sore throat, sores in mouth, or a toothache) UNUSUAL RASH, SWELLING OR PAIN  UNUSUAL VAGINAL DISCHARGE OR ITCHING   Items with * indicate a potential emergency and should be followed up as soon as possible or go to the Emergency Department if any problems should occur.  Please show the CHEMOTHERAPY ALERT CARD or IMMUNOTHERAPY  ALERT CARD at check-in to the Emergency Department and triage nurse.  Should you have questions after your visit or need to cancel or reschedule your appointment, please contact [facility] - A DEPT OF [facility]  Dept: [phone]  and follow the prompts.  Office hours are 8:00 a.m. to 4:30 p.m. Monday - Friday. Please note that voicemails left after 4:00 p.m. may not be returned until the following business day.  We are closed weekends and major holidays. You have access to a nurse at all times for urgent questions. Please call the main number to the clinic Dept: [phone] and follow the prompts.   For any non-urgent questions, you may also contact your provider using MyChart. We now offer e-Visits for anyone 79 and older to request care online for non-urgent symptoms. For details visit [email or web address].   Also download the MyChart app! Go to the app store, search "MyChart", open the app, select [name], and log in with your MyChart username and password.

## 2024-07-08 NOTE — Progress Notes (Signed)
 "     [facility] [phone]   [phone]  OFFICE PROGRESS NOTE  Stamey, [name], FNP [address] [place] [address]  DIAGNOSIS: Stage IV (T1b, N2, M1 C) non-small cell lung cancer, adenocarcinoma presented with right lower lobe lung nodule in addition to small bilateral pulmonary nodules and right hilar and mediastinal lymphadenopathy in addition to liver and bone metastasis diagnosed in April 2024. 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   PDL1: 20%   Guardant 360: no actionable mutations.    PRIOR THERAPY: 1) Decompressive thoracic laminectomy of the T7 with spinal cord decompression and biopsy of the epidural mass in April 2024.  2) Palliative radiation to the bone under the care of Dr. Izell. Last day scheduled for 10/25/22    CURRENT THERAPY: Carboplatin  for an AUC of 5, Alimta  500 mg/m, Keytruda  200 mg IV every 3 weeks.  First dose expected on 10/28/2022. Status post 27 cycles.  Starting from cycle #5 the patient is on maintenance treatment with Alimta  and Keytruda  every 3 weeks.  Starting from cycle #7 the patient will be treated with single agent Alimta  500 Mg/M2 every 3 weeks.  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  INTERVAL HISTORY: Hayley Wu 74 y.o. female returns to the clinic today for follow-up visit.Discussed the use of AI scribe software for clinical note transcription with the patient, who gave verbal consent to proceed.  History of Present Illness Hayley Wu is a 74 year old female with stage IV non-small cell lung adenocarcinoma with liver and bone metastases who presents for evaluation and restaging with repeat CT imaging.  She was diagnosed with stage IV non-small cell lung adenocarcinoma (adenocarcinoma subtype) in April 2024, with liver and bone metastases,  malignant right-sided pleural effusion, and small to moderate pericardial effusion. Molecular testing revealed no actionable mutations and PD-L1 expression of 20%.  Initial systemic therapy included carboplatin , pemetrexed , and pembrolizumab  for four cycles, followed by maintenance pemetrexed  and pembrolizumab , and subsequently pemetrexed  monotherapy. She has completed 27 cycles of systemic therapy to date. 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  Since her last visit, she underwent thoracentesis with removal of 1300 mL of malignant pleural fluid from the right lung. She currently has a small to moderate pericardial effusion without chest pain, palpitations, syncope, or lower extremity edema. She denies shortness of breath, cough, or hemoptysis. She does not have a current cardiologist and last saw one many years ago.  She denies new complaints since her last visit. She reports no nausea, vomiting, or diarrhea. She notes decreased appetite but otherwise feels well and remains independent in her activities of daily living.       MEDICAL HISTORY: Past Medical History:  Diagnosis Date   Allergy    Contact lens/glasses fitting    Hypertension     ALLERGIES:  is allergic to erythromycin, amoxicillin, latex, and prednisone .  MEDICATIONS:  Current Outpatient Medications  Medication Sig Dispense Refill   acetaminophen  (TYLENOL ) 500 MG tablet Take 500 mg by mouth every 6 (six) hours as needed for moderate pain.     amLODipine  (NORVASC ) 10 MG tablet Take 10 mg by mouth daily.     apixaban  (ELIQUIS ) 5 MG TABS tablet Take 1 tablet (5 mg total) by mouth 2 (two) times daily. 60 tablet 2  folic acid  (FOLVITE ) 1 MG tablet Take 1 tablet (1 mg total) by mouth daily. 90 tablet 0   Ipratropium-Albuterol  (COMBIVENT ) 20-100 MCG/ACT AERS respimat Inhale 1 puff into the lungs every 6 (six) hours as needed for wheezing or shortness of  breath. 4 g 1   levothyroxine  (SYNTHROID ) 150 MCG tablet Take 150 mcg by mouth daily before breakfast.     lidocaine -prilocaine  (EMLA ) cream Apply 1 Application topically as needed. 30 g 2   mirtazapine  (REMERON ) 30 MG tablet TAKE 1 TABLET BY MOUTH AT BEDTIME 90 tablet 0   prochlorperazine  (COMPAZINE ) 10 MG tablet Take 1 tablet (10 mg total) by mouth every 6 (six) hours as needed. 30 tablet 2   VITAMIN D PO Take 1 tablet by mouth daily.     No current facility-administered medications for this visit.    SURGICAL HISTORY:  Past Surgical History:  Procedure Laterality Date   ABDOMINAL HYSTERECTOMY     BREAST SURGERY  2010   rt lumpectomy-neg   IR IMAGING GUIDED PORT INSERTION  12/18/2022   LAMINECTOMY N/A 09/21/2022   Procedure: THORACIC LAMINECTOMY FOR TUMOR;  Surgeon: Joshua Alm RAMAN, MD;  Location: [facility] OR;  Service: Neurosurgery;  Laterality: N/A;   OPEN REDUCTION INTERNAL FIXATION (ORIF) DISTAL RADIAL FRACTURE Left 11/26/2013   Procedure: OPEN REDUCTION INTERNAL FIXATION (ORIF) LEFT  DISTAL RADIUS ;  Surgeon: Dempsey JINNY Sensor, MD;  Location: [facility];  Service: Orthopedics;  Laterality: Left;  with block   TUBAL LIGATION      REVIEW OF SYSTEMS:  Constitutional: positive for fatigue Eyes: negative Ears, nose, mouth, throat, and face: negative Respiratory: positive for dyspnea on exertion Cardiovascular: negative Gastrointestinal: negative Genitourinary:negative Integument/breast: negative Hematologic/lymphatic: negative Musculoskeletal:negative Neurological: negative Behavioral/Psych: negative Endocrine: negative Allergic/Immunologic: negative   PHYSICAL EXAMINATION: General appearance: alert, cooperative, fatigued, and no distress Head: Normocephalic, without obvious abnormality, atraumatic Neck: no adenopathy, no JVD, supple, symmetrical, trachea midline, and thyroid  not enlarged, symmetric, no tenderness/mass/nodules Lymph nodes: Cervical, supraclavicular, and  axillary nodes normal. Resp: clear to auscultation bilaterally Back: symmetric, no curvature. ROM normal. No CVA tenderness. Cardio: regular rate and rhythm, S1, S2 normal, no murmur, click, rub or gallop GI: soft, non-tender; bowel sounds normal; no masses,  no organomegaly Extremities: extremities normal, atraumatic, no cyanosis or edema Neurologic: Alert and oriented X 3, normal strength and tone. Normal symmetric reflexes. Normal coordination and gait  ECOG PERFORMANCE STATUS: 1 - Symptomatic but completely ambulatory  Blood pressure 117/78, pulse (!) 117, temperature 98.1 F (36.7 C), temperature source Temporal, resp. rate 17, height 5' 3 (1.6 m), weight 150 lb 3.2 oz (68.1 kg), SpO2 93%.  LABORATORY DATA: Lab Results  Component Value Date   WBC 4.3 07/08/2024   HGB 12.5 07/08/2024   HCT 38.9 07/08/2024   MCV 91.5 07/08/2024   PLT 259 07/08/2024      Chemistry      Component Value Date/Time   NA 143 07/08/2024 1046   K 3.7 07/08/2024 1046   CL 101 07/08/2024 1046   CO2 34 (H) 07/08/2024 1046   BUN 12 07/08/2024 1046   CREATININE 0.61 07/08/2024 1046      Component Value Date/Time   CALCIUM 9.2 07/08/2024 1046   ALKPHOS 80 07/08/2024 1046   AST 21 07/08/2024 1046   ALT 10 07/08/2024 1046   BILITOT 0.4 07/08/2024 1046       RADIOGRAPHIC STUDIES: DG Chest 1 View Result Date: 07/06/2024 CLINICAL DATA:  Post thoracentesis EXAM: CHEST  1  VIEW COMPARISON:  CT 06/27/2024 FINDINGS: Right-sided central venous port tip over the right atrium. Cardiomegaly. Small right greater than left pleural effusions no right pneumothorax. Airspace disease at both lung bases. Known right-sided lung nodules are better appreciated on CT. IMPRESSION: No pneumothorax. Small right greater than left pleural effusions with bibasilar airspace disease. Known right-sided lung nodules are better appreciated on CT. Electronically Signed   By: Luke Bun M.D.   On: 07/06/2024 15:58   US   THORACENTESIS RIGHT ASP PLEURAL SPACE W/IMG GUIDE Result Date: 07/06/2024 INDICATION: Patient with history of stage IV right lung adenocarcinoma with recurrent right pleural effusion, dyspnea. Request received for therapeutic right thoracentesis. EXAM: ULTRASOUND GUIDED THERAPEUTIC RIGHT THORACENTESIS MEDICATIONS: 8 mL 1% lidocaine  with epinephrine  to skin/subcutaneous tissue COMPLICATIONS: None immediate. PROCEDURE: An ultrasound guided thoracentesis was thoroughly discussed with the patient and questions answered. The benefits, risks, alternatives and complications were also discussed. The patient understands and wishes to proceed with the procedure. Written consent was obtained. Ultrasound was performed to localize and mark an adequate pocket of fluid in the right chest. The area was then prepped and draped in the normal sterile fashion. 1% Lidocaine  with epinephrine  was used for local anesthesia. Under ultrasound guidance a 6 Fr Safe-T-Centesis catheter was introduced. Thoracentesis was performed. The catheter was removed and a dressing applied. FINDINGS: A total of approximately 1.3 liters of yellow fluid was removed. IMPRESSION: Successful ultrasound guided therapeutic right thoracentesis yielding 1.3 liters of pleural fluid. Performed by: Keenan Kaltenbacher,PA-C/Kevin Allred, PA-C Electronically Signed   By: Marcey Moan M.D.   On: 07/06/2024 15:42   CT CHEST ABDOMEN PELVIS W CONTRAST Result Date: 07/02/2024 CLINICAL DATA:  Non-small-cell lung cancer. Restaging. Insert body on EXAM: CT CHEST, ABDOMEN, AND PELVIS WITH CONTRAST TECHNIQUE: Multidetector CT imaging of the chest, abdomen and pelvis was performed following the standard protocol during bolus administration of intravenous contrast. RADIATION DOSE REDUCTION: This exam was performed according to the departmental dose-optimization program which includes automated exposure control, adjustment of the mA and/or kV according to patient size and/or  use of iterative reconstruction technique. CONTRAST:  OMNIPAQUE  IOHEXOL  300 MG/ML  SOLN COMPARISON:  04/07/2024 FINDINGS: CT CHEST FINDINGS Cardiovascular: The heart size is normal. No substantial pericardial effusion. Slight interval progression of mild to moderate pericardial effusion. Right Port-A-Cath tip is positioned in the low right atrium. Mild atherosclerotic calcification is noted in the wall of the thoracic aorta. Mediastinum/Nodes: No mediastinal lymphadenopathy. There is no hilar lymphadenopathy. The esophagus has normal imaging features. There is no axillary lymphadenopathy. Lungs/Pleura: Centrilobular and paraseptal emphysema evident. 18 x 8 mm spiculated right lower lobe nodule identified previously is partially obscured by the worsening right lower lobe airspace disease but is probably visible on image 85/3 measuring 11 x 8 mm. Previously characterized 7 mm spiculated right upper lobe nodule is stable on image 43/3. Progressive bibasilar collapse/consolidation, right greater than left. Large right pleural effusion is progressive in the interval. Small left pleural effusion is also progressive since the prior study. Musculoskeletal: Stable appearance of compression deformity involving the T6 through T8 vertebral bodies, most advanced at T7. Previously characterized as pathologic. Posterior surgical changes again noted at the same levels. CT ABDOMEN PELVIS FINDINGS Hepatobiliary: Scattered tiny hypodensities in the liver parenchyma are too small to characterize but are statistically most likely benign. No followup imaging is recommended. Focal hypo attenuation in the medial segment left liver along the falciform ligament is minimally more conspicuous today and probably reflects focal fatty deposition although  close attention on follow-up warranted. Calcified gallstone again noted. No intrahepatic or extrahepatic biliary dilation. Pancreas: No focal mass lesion. No dilatation of the main duct. No  intraparenchymal cyst. No peripancreatic edema. Spleen: No splenomegaly. No suspicious focal mass lesion. Adrenals/Urinary Tract: No adrenal nodule or mass. Tiny well-defined homogeneous low-density lesion in the right kidney is too small to characterize but statistically most likely benign and probably a cyst. No followup imaging is recommended. Left kidney unremarkable. 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 No evidence for hydroureter. Bladder is decompressed although appearance does raise concern for associated component of bladder wall thickening. Stomach/Bowel: Stomach is unremarkable. No gastric wall thickening. No evidence of outlet obstruction. Duodenum is normally positioned as is the ligament of Treitz. No small bowel wall thickening. No small bowel dilatation. The terminal ileum is normal. The appendix is not well visualized, but there is no edema or inflammation in the region of the cecal tip to suggest appendicitis. Cecal tip is identified in the anterior pelvis. No gross colonic mass. No colonic wall thickening. Diverticular changes are noted in the left colon without evidence of diverticulitis. Vascular/Lymphatic: There is moderate atherosclerotic calcification of the abdominal aorta without aneurysm. There is no gastrohepatic or hepatoduodenal ligament lymphadenopathy. No retroperitoneal or mesenteric lymphadenopathy. No pelvic sidewall lymphadenopathy. Reproductive: Hysterectomy. Stable 18 mm left adnexal cyst, likely benign. No right adnexal mass. Other: No substantial intraperitoneal free fluid. Musculoskeletal: Sclerosis in the L2 vertebral body is stable, previously characterized metastatic involvement. IMPRESSION: 1. 18 x 8 mm spiculated right lower lobe nodule identified previously is partially obscured by the worsening right lower lobe airspace disease but is probably visible, measuring 11 x 8 mm. 2. Previously characterized 7 mm spiculated right upper lobe nodule is  stable. 3. Progressive bibasilar collapse/consolidation, right greater than left. 4. Large right pleural effusion is progressive in the interval. Small left pleural effusion is also progressive since the prior study. 5. Slight interval progression of mild to moderate pericardial effusion. 6. Stable appearance of compression deformity involving the T6 through T8 vertebral bodies, most advanced at T7. Previously characterized as pathologic. 7. Stable sclerosis in the L2 vertebral body, compatible with metastatic involvement. 8. Cholelithiasis. 9. Left colonic diverticulosis without diverticulitis. 10. Bladder is decompressed although appearance does raise concern for associated component of bladder wall thickening. Correlation with urinalysis recommended to exclude cystitis. 11. Aortic Atherosclerosis (ICD10-I70.0) and Emphysema (ICD10-J43.9). Electronically Signed   By: Camellia Candle M.D.   On: 07/02/2024 08:52   US  THORACENTESIS ASP PLEURAL SPACE W/IMG GUIDE Result Date: 06/09/2024 INDICATION: 74 year old female with history of stage IV right lung adenocarcinoma with recurrent pleural effusion. Received request for therapeutic thoracentesis. EXAM: ULTRASOUND GUIDED THERAPEUTIC, RIGHT-SIDED THORACENTESIS MEDICATIONS: 8 mL 1% lidocaine  COMPLICATIONS: None immediate. PROCEDURE: An ultrasound guided thoracentesis was thoroughly discussed with the patient and questions answered. The benefits, risks, alternatives and complications were also discussed. The patient understands and wishes to proceed with the procedure. Written consent was obtained. Ultrasound was performed to localize and mark an adequate pocket of fluid in the right chest. The area was then prepped and draped in the normal sterile fashion. 1% lidocaine  was used for local anesthesia. Under ultrasound guidance a 6 Fr Safe-T-Centesis catheter was introduced. Thoracentesis was performed. The catheter was removed and a dressing applied. FINDINGS: A total of  approximately 1450 mL of yellow fluid was removed. IMPRESSION: Successful ultrasound guided right thoracentesis yielding 1450 mL of pleural fluid. Performed by: Kristi Davenport, NP under the  supervision of Dr. Juliene Balder Electronically Signed   By: Juliene Balder M.D.   On: 06/09/2024 16:37   DG Chest 1 View Result Date: 06/09/2024 EXAM: 1 VIEW(S) XRAY OF THE CHEST 06/09/2024 02:22:00 PM COMPARISON: 06/05/2024 CLINICAL HISTORY: S/P thoracentesis FINDINGS: LINES, TUBES AND DEVICES: Stable right IJ port catheter. LUNGS AND PLEURA: Small pleural effusion which is decreased in size compared to the prior radiograph. There is slight interval improvement in aeration of the right mid and lower lung. Possible small left pleural effusion. No focal pulmonary opacity. No pneumothorax. HEART AND MEDIASTINUM: Cardiomegaly, unchanged. BONES AND SOFT TISSUES: No acute osseous abnormality. IMPRESSION: 1. Small right pleural effusion, decreased in size compared to the prior radiograph. 2. Possible small left pleural effusion. Electronically signed by: Donnice Mania MD 06/09/2024 02:48 PM EST RP Workstation: [id number]      ASSESSMENT AND PLAN: This is a very pleasant 74 years old white female with Stage IV (T1b, N2, M1 C) non-small cell lung cancer, adenocarcinoma presented with right lower lobe lung nodule in addition to small bilateral pulmonary nodules and right hilar and mediastinal lymphadenopathy in addition to liver and bone metastasis diagnosed in April 2024. 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 Molecular studies showed no actionable mutations and PD-L1 expression of 20%. 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  She also had palliative radiation to the bone under the care of Dr. Izell. Last day scheduled for  10/25/22. She is currently on palliative systemic chemotherapy with carboplatin  for AUC of 5, Alimta  500 Mg/M2 and Keytruda  200 Mg IV every 3 weeks status post 27 cycles.  Starting from cycle #5 the patient is on maintenance treatment with Alimta  and Keytruda  every 3 weeks.  After cycle #6 Keytruda  was discontinued secondary to suspicious immunotherapy mediated pneumonitis with recent hospitalization and acute respiratory failure. The patient had repeat CT scan of the chest, abdomen and pelvis performed recently.  I personally independently reviewed the scan images and discussed the result with the patient today.  Her scan showed no concerning findings for disease progression except for slightly increased size of the right lung nodule in addition to the moderate to large right pleural effusion she underwent ultrasound-guided thoracentesis with drainage of 1600 cc of right pleural fluid. Assessment and Plan Assessment & Plan Stage IV non-small cell lung adenocarcinoma Metastatic adenocarcinoma with hepatic and osseous involvement, diagnosed April 2024. Disease remains stable on single-agent pemetrexed  maintenance therapy, with no significant progression on recent restaging CT. She is clinically stable, tolerating therapy without new complaints or acute toxicities. Prognosis remains poor due to metastatic burden, but she is maintaining stability on standard palliative therapy. - Continue single-agent pemetrexed  maintenance therapy every 3 weeks, holding for cytopenias or clinical instability. - Monitor for cytopenias, fatigue, dyspnea, and symptoms of pleural effusion prior to each cycle. - Repeat CT chest, abdomen, and pelvis every 3 cycles or as clinically indicated for restaging. - Obtain chest radiograph post-chemotherapy to assess for pleural fluid. - Perform clinical evaluation and laboratory studies prior to each cycle. - Follow up in three weeks for next treatment cycle.  Malignant pleural  effusion Thoracentesis with removal of 1300 mL right-sided pleural fluid since last visit. She has persistent moderate right-sided pleural effusion but remains asymptomatic with no new complaints related to the effusion. - Monitor  for recurrence of symptoms related to pleural effusion, including dyspnea. - Obtain chest radiograph post-chemotherapy to assess for pleural fluid. - Clinical evaluation every 3 weeks prior to chemotherapy.  Pericardial effusion Small to moderate pericardial effusion persists on imaging. She is asymptomatic and has not been evaluated by cardiology. Further assessment is needed to determine hemodynamic significance and guide management. - Refer to cardiology for evaluation and consideration of echocardiogram. - Monitor for symptoms attributable to pericardial effusion. The patient was advised to call immediately if she has any concerning symptoms in the interval.  The patient voices understanding of current disease status and treatment options and is in agreement with the current care plan.  All questions were answered. The patient knows to call the clinic with any problems, questions or concerns. We can certainly see the patient much sooner if necessary.  The total time spent in the appointment was 30 minutes including review of chart and various tests results, discussions about plan of care and coordination of care plan .   Disclaimer: This note was dictated with voice recognition software. Similar sounding words can inadvertently be transcribed and may not be corrected upon review.        "

## 2024-07-12 NOTE — Progress Notes (Unsigned)
 " Cardiology Office Note:  .   Date:  07/13/2024  ID:  Hayley Wu, DOB 1950-09-19, MRN [id number] PCP: Crecencio, [name], FNP  [facility] Providers Cardiologist:  None   History of Present Illness: .    Chief Complaint  Patient presents with   Pericardial Effusion    Hayley Wu is a 74 y.o. female with below history who presents for the evaluation of pericardial effusion at the request of Sherrod Sherrod, MD.  Discussed the use of AI scribe software for clinical note transcription with the patient, who gave verbal consent to proceed.  History of Present Illness   Hayley Wu is a 74 year old female with stage four lung cancer who presents with pericardial effusion. She is accompanied by her sister. She was referred by Dr. Gatha for evaluation of pericardial effusion seen on chest CT.  She has stage four lung cancer with metastases to the liver and bone and is currently on palliative chemotherapy. She was referred after being told there was fluid around her heart seen on a chest CT. She has experienced recurrent pleural effusions, particularly on the right side, which have been drained periodically, most recently last week. The fluid buildup affects her breathing, and she has been on supplemental oxygen  for the past month, though not continuously. No chest pain, fevers, or chills.  Her medical history includes hypertension and hyperlipidemia. 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 She also has a history of a thyroid  nodule treated with radioactive iodine approximately eight years ago, resulting in partial thyroid  ablation. No history of heart attacks, strokes, or diabetes.  She experienced a deep vein thrombosis in her leg in October of 2024 and is on Eliquis  indefinitely. She reports occasional leg swelling, previously managed with HCTZ, but she has  not been on it since her hospitalization a year ago.  She is a former smoker, which is likely related to her lung cancer, but she has since quit. She denies current alcohol  use. She is widowed, has three children, and six grandchildren.           Problem List Lung CA -Stage IV lung adenocarcinoma  -liver/bone mets  -paliative chemo 2. Malignant pleural effusion  3. Pericardial effusion  -moderate CT 07/02/2024 4. HTN 5. HLD 6. L DVT -02/2023    ROS: All other ROS reviewed and negative. Pertinent positives noted in the HPI.     Studies Reviewed: SABRA   EKG Interpretation Date/Time:  Tuesday July 13 2024 14:35:51 EST Ventricular Rate:  119 PR Interval:  134 QRS Duration:  76 QT Interval:  320 QTC Calculation: 450 R Axis:   18  Text Interpretation: Sinus tachycardia Cannot rule out Anterior infarct (cited on or before 24-Mar-2024) Confirmed by Barbaraann Kotyk [phone]) on 07/13/2024 2:37:38 PM   Physical Exam:   VS:  BP 112/76   Pulse (!) 110   Ht 5' 3 (1.6 m)   Wt 153 lb 3.2 oz (69.5 kg)   SpO2 94%   BMI 27.14 kg/m    Wt Readings from Last 3 Encounters:  07/13/24 153 lb 3.2 oz (69.5 kg)  07/08/24 150 lb 3.2 oz (68.1 kg)  06/16/24 152 lb 2 oz (69 kg)    GEN: Well nourished, well developed in no acute distress NECK: No JVD; No carotid bruits CARDIAC: RRR, no murmurs, rubs, gallops RESPIRATORY:  Clear to auscultation without rales, wheezing or rhonchi  ABDOMEN: Soft, non-tender, non-distended EXTREMITIES:  No edema; No deformity  ASSESSMENT AND PLAN: .   Assessment and Plan    Malignant pericardial effusion Likely secondary to metastatic lung cancer. CT shows pericardial fluid without significant impact on cardiac function. Monitoring advised unless function is compromised. - Ordered echocardiogram to assess pericardial effusion and cardiac function. - Prescribed Lasix  20 mg PRN for edema with potassium supplementation. - Ordered BNP.  Tachycardia Chronic  tachycardia, possibly due to cancer treatment or other conditions. Elevated heart rate without shock. Blood pressure stable at 112/76. - Performed EKG to evaluate heart rate and rhythm. - Monitor heart rate and rhythm.       UPDATE: We were able to add her on for an echocardiogram today.  This does show a moderate to large pericardial effusion.  There is no obvious RV diastolic collapse.  Her IVC only measures 1.9 cm and does collapse with inspiration.  We did discuss that she does not look like she has echocardiographic evidence of tamponade but I suspect this effusion may need to be drained at some point.  I will plan to see her back in 2 weeks to see how she is doing.         Follow-up: Return in about 6 months (around 01/10/2025).  Signed, Darryle DASEN. Barbaraann, MD, [facility]  [facility]  [address] [place], [place] [address] [phone]  2:58 PM   "

## 2024-07-13 ENCOUNTER — Encounter (HOSPITAL_BASED_OUTPATIENT_CLINIC_OR_DEPARTMENT_OTHER): Payer: Self-pay | Admitting: Cardiovascular Disease

## 2024-07-13 ENCOUNTER — Ambulatory Visit (INDEPENDENT_AMBULATORY_CARE_PROVIDER_SITE_OTHER)

## 2024-07-13 ENCOUNTER — Ambulatory Visit (INDEPENDENT_AMBULATORY_CARE_PROVIDER_SITE_OTHER): Admitting: Cardiovascular Disease

## 2024-07-13 VITALS — BP 112/76 | HR 110 | Ht 63.0 in | Wt 153.2 lb

## 2024-07-13 DIAGNOSIS — I3131 Malignant pericardial effusion in diseases classified elsewhere: Secondary | ICD-10-CM

## 2024-07-13 DIAGNOSIS — R000 Tachycardia, unspecified: Secondary | ICD-10-CM

## 2024-07-13 LAB — ECHOCARDIOGRAM COMPLETE
AV Vena cont: 0.3 cm
Area-P 1/2: 5.23 cm2
Height: 63 in
P 1/2 time: 480 ms
S' Lateral: 2.73 cm
Weight: 2451.2 [oz_av]

## 2024-07-13 MED ORDER — POTASSIUM CHLORIDE CRYS ER 10 MEQ PO TBCR: EXTENDED_RELEASE_TABLET | ORAL | 0 refills | Status: AC

## 2024-07-13 MED ORDER — FUROSEMIDE 20 MG PO TABS: ORAL_TABLET | ORAL | 0 refills | Status: AC

## 2024-07-13 NOTE — Patient Instructions (Addendum)
 Medication Instructions:  TAKE FUROSEMIDE  20 MG DAILY AS NEEDED FOR SWELLING OR WORSENING IN YOUR BREATHING   TAKE POTASSIUM 10 MEQ ON DAYS YOU TAKE THE FUROSEMIDE    *If you need a refill on your cardiac medications before your next appointment, please call your pharmacy*  Lab Work: BNP TODAY AFTER YOUR ECHO   If you have labs (blood work) drawn today and your tests are completely normal, you will receive your results only by: MyChart Message (if you have MyChart) OR A paper copy in the mail If you have any lab test that is abnormal or we need to change your treatment, we will call you to review the results.  Testing/Procedures: Your physician has requested that you have an echocardiogram. Echocardiography is a painless test that uses sound waves to create images of your heart. It provides your doctor with information about the size and shape of your heart and how well your hearts chambers and valves are working. This procedure takes approximately one hour. There are no restrictions for this procedure. Please do NOT wear cologne, perfume, aftershave, or lotions (deodorant is allowed). Please arrive 15 minutes prior to your appointment time.  Please note: We ask at that you not bring children with you during ultrasound (echo/ vascular) testing. Due to room size and safety concerns, children are not allowed in the ultrasound rooms during exams. Our front office staff cannot provide observation of children in our lobby area while testing is being conducted. An adult accompanying a patient to their appointment will only be allowed in the ultrasound room at the discretion of the ultrasound technician under special circumstances. We apologize for any inconvenience. TODAY   Follow-Up: 07/28/2024 1:40 PM AT [facility] LOC[facility]   We recommend signing up for the patient portal called MyChart.  Sign up information is provided on this After Visit Summary.  MyChart is used to connect with patients for  Virtual Visits (Telemedicine).  Patients are able to view lab/test results, encounter notes, upcoming appointments, etc.  Non-urgent messages can be sent to your provider as well.   To learn more about what you can do with MyChart, go to [email or web address].   Other Instructions

## 2024-07-14 ENCOUNTER — Ambulatory Visit: Payer: Self-pay | Admitting: Cardiovascular Disease

## 2024-07-14 LAB — BRAIN NATRIURETIC PEPTIDE: BNP: 79.1 pg/mL (ref 0.0–100.0)

## 2024-07-14 NOTE — Telephone Encounter (Signed)
 Called Ms. Iannacone back.  Discussed large pericardial effusion.  No overt tamponade at this would not improve.  We discussed pericardiocentesis we can do Friday.  I would recommend she be admitted to the hospital stay overnight for pericardial drain placement.  She can be discharged as long as the fluid accumulation is minimal (less than 50 cc).  She do not take Eliquis  today.  She will continue to hold this.  We will set her up.  Risk and benefits were explained.  She will then see me back in 2 weeks.  Informed Consent   Shared Decision Making/Informed Consent The risks [bleeding, infection, cardiac arrest, damage to the heart or blood vessels requiring surgery, pneumothorax requiring chest tube placement, arrhythmias, changes in blood pressure, organ injury, and rarely death], benefits [therapeutic relief of fluid collection around the heart, diagnostic support], and alternatives of a pericardiocentesis were discussed in detail with Ms. Nawaz and she agrees to proceed.     Signed, Darryle DASEN. Barbaraann, MD, [facility]  [facility]  [address] [place], [place] [address] [phone]  9:48 AM

## 2024-07-14 NOTE — Telephone Encounter (Signed)
 Called Hayley Wu.  Pericardial fusion is large as seen on echo yesterday.  No obvious signs of tamponade but given the size of the effusion and ongoing malignancy I believe this needs to be drained.  She did not answer.  I left a message for her to call us  back.  We can tentatively plan to schedule her for outpatient pericardiocentesis on Friday.  We will reach back out to her this morning.  Signed, Darryle DASEN. Barbaraann, MD, [facility]  [facility]  [address] [place], [place] [address] [phone]  8:01 AM

## 2024-07-16 ENCOUNTER — Encounter (HOSPITAL_COMMUNITY): Admission: RE | Payer: Self-pay

## 2024-07-16 ENCOUNTER — Other Ambulatory Visit: Payer: Self-pay

## 2024-07-16 ENCOUNTER — Inpatient Hospital Stay (HOSPITAL_COMMUNITY): Admission: RE | Admit: 2024-07-16 | Admitting: Cardiology

## 2024-07-16 ENCOUNTER — Ambulatory Visit (HOSPITAL_COMMUNITY)

## 2024-07-16 DIAGNOSIS — I3139 Other pericardial effusion (noninflammatory): Principal | ICD-10-CM | POA: Diagnosis present

## 2024-07-16 LAB — GRAM STAIN

## 2024-07-16 LAB — BODY FLUID CELL COUNT WITH DIFFERENTIAL
Eos, Fluid: 1 %
Lymphs, Fluid: 16 %
Monocyte-Macrophage-Serous Fluid: 19 % — ABNORMAL LOW (ref 50–90)
Neutrophil Count, Fluid: 64 % — ABNORMAL HIGH (ref 0–25)
Total Nucleated Cell Count, Fluid: 95 uL (ref 0–1000)

## 2024-07-16 LAB — CBC
HCT: 33.3 % — ABNORMAL LOW (ref 36.0–46.0)
Hemoglobin: 10.7 g/dL — ABNORMAL LOW (ref 12.0–15.0)
MCH: 29.5 pg (ref 26.0–34.0)
MCHC: 32.1 g/dL (ref 30.0–36.0)
MCV: 91.7 fL (ref 80.0–100.0)
Platelets: 91 10*3/uL — ABNORMAL LOW (ref 150–400)
RBC: 3.63 MIL/uL — ABNORMAL LOW (ref 3.87–5.11)
RDW: 13.7 % (ref 11.5–15.5)
WBC: 1.5 10*3/uL — ABNORMAL LOW (ref 4.0–10.5)
nRBC: 0 % (ref 0.0–0.2)

## 2024-07-16 LAB — CREATININE, SERUM
Creatinine, Ser: 0.62 mg/dL (ref 0.44–1.00)
GFR, Estimated: >60 mL/min

## 2024-07-16 LAB — ECHOCARDIOGRAM LIMITED
Height: 63 in
Weight: 2416 [oz_av]

## 2024-07-16 MED ORDER — SODIUM CHLORIDE 0.9 % IV SOLN
INTRAVENOUS | Status: AC | PRN
  Administered 2024-07-16: 250 mL via INTRAVENOUS

## 2024-07-16 MED ORDER — FOLIC ACID 1 MG PO TABS
1.0000 mg | ORAL_TABLET | Freq: Every day | ORAL | Status: AC
  Administered 2024-07-16: 1 mg via ORAL
  Filled 2024-07-16: qty 1

## 2024-07-16 MED ORDER — SODIUM CHLORIDE 0.9 % IV SOLN: 250.0000 mL | INTRAVENOUS | Status: DC | PRN

## 2024-07-16 MED ORDER — SODIUM CHLORIDE 0.9 % IV SOLN: 250.0000 mL | INTRAVENOUS | Status: AC | PRN

## 2024-07-16 MED ORDER — CHLORHEXIDINE GLUCONATE CLOTH 2 % EX PADS
6.0000 | MEDICATED_PAD | Freq: Every day | CUTANEOUS | Status: AC
  Administered 2024-07-16: 6 via TOPICAL

## 2024-07-16 MED ORDER — SODIUM CHLORIDE 0.9% FLUSH: 3.0000 mL | INTRAVENOUS | Status: AC | PRN

## 2024-07-16 MED ORDER — FENTANYL CITRATE (PF) 100 MCG/2ML IJ SOLN
INTRAMUSCULAR | Status: AC
  Filled 2024-07-16: qty 2

## 2024-07-16 MED ORDER — FENTANYL CITRATE (PF) 100 MCG/2ML IJ SOLN
INTRAMUSCULAR | Status: DC | PRN
  Administered 2024-07-16 (×2): 25 ug via INTRAVENOUS

## 2024-07-16 MED ORDER — CALCIUM CARBONATE ANTACID 500 MG PO CHEW
2.0000 | CHEWABLE_TABLET | Freq: Two times a day (BID) | ORAL | Status: AC | PRN
  Administered 2024-07-16: 400 mg via ORAL
  Filled 2024-07-16: qty 2

## 2024-07-16 MED ORDER — MIRTAZAPINE 15 MG PO TABS
30.0000 mg | ORAL_TABLET | Freq: Every day | ORAL | Status: AC
  Administered 2024-07-16: 30 mg via ORAL
  Filled 2024-07-16: qty 2

## 2024-07-16 MED ORDER — ACETAMINOPHEN 325 MG PO TABS
650.0000 mg | ORAL_TABLET | ORAL | Status: AC | PRN
  Administered 2024-07-16 (×2): 650 mg via ORAL
  Filled 2024-07-16 (×2): qty 2

## 2024-07-16 MED ORDER — FREE WATER
500.0000 mL | Freq: Once | Status: AC
  Administered 2024-07-16: 500 mL via ORAL

## 2024-07-16 MED ORDER — MIDAZOLAM HCL 2 MG/2ML IJ SOLN
INTRAMUSCULAR | Status: AC
  Filled 2024-07-16: qty 2

## 2024-07-16 MED ORDER — ORAL CARE MOUTH RINSE: 15.0000 mL | OROMUCOSAL | Status: AC | PRN

## 2024-07-16 MED ORDER — HYDRALAZINE HCL 20 MG/ML IJ SOLN: 10.0000 mg | INTRAMUSCULAR | Status: AC | PRN

## 2024-07-16 MED ORDER — HEPARIN SODIUM (PORCINE) 5000 UNIT/ML IJ SOLN: 5000.0000 [IU] | Freq: Three times a day (TID) | INTRAMUSCULAR | Status: AC

## 2024-07-16 MED ORDER — SODIUM CHLORIDE 0.9% FLUSH
3.0000 mL | Freq: Two times a day (BID) | INTRAVENOUS | Status: DC
  Administered 2024-07-16: 3 mL via INTRAVENOUS

## 2024-07-16 MED ORDER — SODIUM CHLORIDE 0.9% FLUSH
3.0000 mL | Freq: Two times a day (BID) | INTRAVENOUS | Status: AC
  Administered 2024-07-16 (×2): 3 mL via INTRAVENOUS

## 2024-07-16 MED ORDER — LIDOCAINE HCL (PF) 1 % IJ SOLN
INTRAMUSCULAR | Status: AC
  Filled 2024-07-16: qty 30

## 2024-07-16 MED ORDER — MIDAZOLAM HCL (PF) 2 MG/2ML IJ SOLN
INTRAMUSCULAR | Status: DC | PRN
  Administered 2024-07-16 (×2): 1 mg via INTRAVENOUS

## 2024-07-16 MED ORDER — ONDANSETRON HCL 4 MG/2ML IJ SOLN: 4.0000 mg | Freq: Four times a day (QID) | INTRAMUSCULAR | Status: AC | PRN

## 2024-07-16 MED ORDER — LIDOCAINE HCL (PF) 1 % IJ SOLN
INTRAMUSCULAR | Status: DC | PRN
  Administered 2024-07-16 (×3): 2 mL

## 2024-07-16 MED ORDER — SODIUM CHLORIDE 0.9% FLUSH: 3.0000 mL | INTRAVENOUS | Status: DC | PRN

## 2024-07-16 MED ORDER — LABETALOL HCL 5 MG/ML IV SOLN: 10.0000 mg | INTRAVENOUS | Status: AC | PRN

## 2024-07-16 MED ORDER — HEPARIN (PORCINE) IN NACL 1000-0.9 UT/500ML-% IV SOLN
INTRAVENOUS | Status: DC | PRN
  Administered 2024-07-16: 1000 mL

## 2024-07-16 NOTE — TOC Initial Note (Signed)
 Transition of Care [facility]) - Initial/Assessment Note    Patient Details  Name: Hayley Wu MRN: [id number] Date of Birth: [birth year]  [facility] CM/SW Contact:    Hayley Wu Phone Number: [phone] 07/16/2024, 4:10 PM  Clinical Narrative:      [facility] CSW met with patient and daughter at bedside. Patient stated that she lives alone. Patient stated that she does not drive and relies on her sisters or daughter to help with transportation. Patient stated that she she has history of HH services, but nothing current. Patient stated that she's uses 02 and a rollator at home. Patient stated that she has a scale. Patient stated that she has a PCP. CSW explained that a hospital follow up appointment is typically scheduled closer towards dc. Patient is agreeable. Family will provide transportation at dc.   [facility] CSW/Unit CM will continue to follow and monitor for dc readiness.              Expected Discharge Plan: Home/Self Care Barriers to Discharge: Continued Medical Work up   Patient Goals and CMS Choice Patient states their goals for this hospitalization and ongoing recovery are:: return home CMS Medicare.gov Compare Post Acute Care list provided to:: Patient Choice offered to / list presented to : Patient [place] ownership interest in [facility].provided to:: Patient    Expected Discharge Plan and Services       Living arrangements for the past 2 months: Single Family Home                                      Prior Living Arrangements/Services Living arrangements for the past 2 months: Single Family Home Lives with:: Self Patient language and need for interpreter reviewed:: Yes Do you feel safe going back to the place where you live?: Yes      Need for Family Participation in Patient Care: No (Comment) Care giver support system in place?: Yes (comment)   Criminal Activity/Legal Involvement Pertinent to Current  Situation/Hospitalization: No - Comment as needed  Activities of Daily Living      Permission Sought/Granted Permission sought to share information with : Family Supports, Case Manager, PCP                Emotional Assessment Appearance:: Appears stated age Attitude/Demeanor/Rapport: Engaged Affect (typically observed): Appropriate Orientation: : Oriented to Self, Oriented to Place, Oriented to  Time, Oriented to Situation Alcohol  / Substance Use: Not Applicable Psych Involvement: No (comment)  Admission diagnosis:  Pericardial effusion [I31.39] Patient Active Problem List   Diagnosis Date Noted   Pericardial effusion 07/16/2024   Pleural effusion 03/24/2024   Acute deep vein thrombosis (DVT) of femoral vein of both lower extremities (HCC) 03/19/2023   Normocytic anemia 03/01/2023   Moderate protein malnutrition 03/01/2023   Acute respiratory failure with hypoxia (HCC) 03/01/2023   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   Cardiomegaly 03/01/2023   Pleural effusion due to CHF (congestive heart failure) (HCC) 03/01/2023   Port-A-Cath in place 12/30/2022   Encounter for antineoplastic immunotherapy 11/18/2022   Encounter for antineoplastic chemotherapy 11/18/2022   Constipation 11/05/2022   Goals of care, counseling/discussion 10/21/2022   Cancer, metastatic to bone (HCC) 10/04/2022   Adenocarcinoma of right lung, stage 4 (HCC) 10/01/2022   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 09/20/2022   Hypokalemia 09/20/2022   Lung mass 09/20/2022   Hypothyroidism 09/20/2022   Cholelithiasis 09/20/2022   Lumbar spondylosis 08/26/2022   Thoracic back pain 04/04/2022   Low back pain 04/04/2022   Hypertension 09/23/2011   PCP:  Stamey, [name], FNP Pharmacy:   [facility] [address], [place] - [address] [place] [address] Phone: [phone] Fax: [phone]     Social Drivers of Health (SDOH) Social History: SDOH Screenings   Food Insecurity: No Food Insecurity (07/16/2024)  Housing: Low Risk (07/16/2024)  Transportation Needs: No Transportation Needs (07/16/2024)  Utilities: Not At Risk (07/16/2024)  Depression (PHQ2-9): Low Risk (07/08/2024)  Social Connections: Moderately Isolated (07/16/2024)  Tobacco Use: Medium Risk (07/13/2024)   SDOH Interventions:     Readmission Risk Interventions    03/04/2023    6:23 PM  Readmission Risk Prevention Plan  Transportation Screening Complete  PCP or Specialist Appt within 3-5 Days Complete  HRI or Home Care Consult Complete  Social Work Consult for Recovery Care Planning/Counseling Complete  Palliative Care Screening Not Applicable  Medication Review Oceanographer) Complete

## 2024-07-16 NOTE — Interval H&P Note (Signed)
 History and Physical Interval Note:  07/16/2024 7:47 AM  Hayley Wu  has presented today for surgery, with the diagnosis of pericardial effusion.  The various methods of treatment have been discussed with the patient and family. After consideration of risks, benefits and other options for treatment, the patient has consented to  Procedures: PERICARDIOCENTESIS (N/A) as a surgical intervention.  The patient's history has been reviewed, patient examined, no change in status, stable for surgery.  I have reviewed the patient's chart and labs.  Questions were answered to the patient's satisfaction.     Denzil Bristol J Emory Leaver

## 2024-07-16 NOTE — H&P (Signed)
 OV 07/13/2024 copied for documentation   Cardiology Office Note:  .   Date:  07/16/2024  ID:  Hayley Wu, DOB 01-Jan-1951, MRN [id number] PCP: Stamey, [name], FNP  [facility] Providers Cardiologist:  None   History of Present Illness: .    C/C: Pericardial effusion   Hayley Wu is a 74 y.o. female with below history who presents for the evaluation of pericardial effusion at the request of No ref. provider found.  Discussed the use of AI scribe software for clinical note transcription with the patient, who gave verbal consent to proceed.  History of Present Illness               Problem List Lung CA -Stage IV lung adenocarcinoma  -liver/bone mets  -paliative chemo 2. Malignant pleural effusion  3. Pericardial effusion  -moderate CT 07/02/2024 4. HTN 5. HLD 6. L DVT -02/2023    ROS: All other ROS reviewed and negative. Pertinent positives noted in the HPI.     Studies Reviewed: SABRA       Physical Exam:   VS:  BP 119/81   Pulse (!) 112   Temp 97.6 F (36.4 C) (Oral)   Resp 19   Ht 5' 3 (1.6 m)   Wt 68.5 kg   SpO2 91%   BMI 26.75 kg/m    Wt Readings from Last 3 Encounters:  07/16/24 68.5 kg  07/13/24 69.5 kg  07/08/24 68.1 kg    GEN: Well nourished, well developed in no acute distress NECK: No JVD; No carotid bruits CARDIAC: RRR, no murmurs, rubs, gallops RESPIRATORY:  Clear to auscultation without rales, wheezing or rhonchi  ABDOMEN: Soft, non-tender, non-distended EXTREMITIES:  No edema; No deformity  ASSESSMENT AND PLAN: .   Assessment and Plan            UPDATE: We were able to add her on for an echocardiogram today.  This does show a moderate to large pericardial effusion.  There is no obvious RV diastolic collapse.  Her IVC only measures 1.9 cm and does collapse with inspiration.  We did discuss that she does not look like she has echocardiographic evidence of tamponade but I suspect this effusion may need to be drained at some  point.  I will plan to see her back in 2 weeks to see how she is doing.  Called Hayley Wu back.  Discussed large pericardial effusion.  No overt tamponade at this would not improve.  We discussed pericardiocentesis we can do Friday.  I would recommend she be admitted to the hospital stay overnight for pericardial drain placement.  She can be discharged as long as the fluid accumulation is minimal (less than 50 cc).  She do not take Eliquis  today.  She will continue to hold this.  We will set her up.  Risk and benefits were explained.  She will then see me back in 2 weeks.          Follow-up: No follow-ups on file.  Signed, Darryle DASEN. Barbaraann, MD, [facility]  [facility]  [address] [place], [place] [address] [phone]  7:46 AM

## 2024-07-16 NOTE — Progress Notes (Signed)
 CCM not following patient.  Asked by nursing if home folic acid  and remeron  could be ordered for tonight at patient request.   I have reviewed her home medication list. She takes both of these. Do not see any contraindications   Ordered.   Sign off. Please call cardiology for any questions about patient care.   Tinnie FORBES Furth, PA-C [facility] 07/16/24 9:45 PM  Please see [email or web address] for pager details. From 7A-7P if no response, please call [phone]

## 2024-07-16 NOTE — Consult Note (Addendum)
 "   Advanced Heart Failure Team Consult Note   Primary Physician: Crecencio [name], FNP Cardiologist:  Darryle ONEIDA Decent, MD CC: Large Pericardial Effusion S/p Pericardiocentesis  HPI:    Hayley Wu is a 74 year old female w/ stage IV metastatic lung cancer, who was referred to outpatient cardiology, by her oncologist, for further evaluation of a large pericardial effusion noted on chest CT.   2D Echo done on 2/3 confirmed a large, circumferential, pericardial effusion w/ ventricular interdependence and respiratory  variation of the tricuspid inflow velocities. No tamponade. EF 50-55%, RV hyperdynamic.   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 Eliquis  held and she was bridged w/ SQ heparin .   She was referred for pericardiocentesis, done today w/ 570 cc of clear pericardial fluid was drained.  Post procedural limited echo showed small residual effusion.   She has been admitted to the CCU for surveillance.   Subxyphoid drain remains in place. BP normotensive.   Cytology pending     Home Medications Prior to Admission medications  Medication Sig Start Date End Date Taking? Authorizing Provider  acetaminophen  (TYLENOL ) 500 MG tablet Take 500 mg by mouth every 6 (six) hours as needed for moderate pain.   Yes [provider]  amLODipine  (NORVASC ) 10 MG tablet Take 10 mg by mouth daily.   Yes [provider]  apixaban  (ELIQUIS ) 5 MG TABS tablet Take 1 tablet (5 mg total) by mouth 2 (two) times daily. 03/25/23  Yes Sebastian Toribio GAILS, MD  Cholecalciferol (VITAMIN D3) 50 MCG (2000 UT) capsule Take 2,000 Units by mouth daily.   Yes [provider]  folic acid  (FOLVITE ) 1 MG tablet Take 1 tablet (1 mg total) by mouth daily. Patient taking differently: Take 1 mg by mouth at bedtime. 02/12/24  Yes Heilingoetter, Cassandra L, PA-C  furosemide  (LASIX ) 20 MG tablet TAKE 1 TABLET DAILY AS NEEDED FOR shortness of breath OR SWELLING  07/13/24  Yes O'Neal, Darryle Ned, MD  levothyroxine  (SYNTHROID ) 150 MCG tablet Take 150 mcg by mouth daily before breakfast.   Yes [provider]  lidocaine -prilocaine  (EMLA ) cream Apply 1 Application topically as needed. 12/30/22  Yes Heilingoetter, Cassandra L, PA-C  mirtazapine  (REMERON ) 30 MG tablet TAKE 1 TABLET BY MOUTH AT BEDTIME 05/23/24  Yes Sherrod Sherrod, MD  potassium chloride  (KLOR-CON  M) 10 MEQ tablet TAKE 1 TABLET ON DAYS YOU TAKE FUROSEMIDE  Patient taking differently: Take 10 mEq by mouth as needed. TAKE 1 TABLET ON DAYS YOU TAKE FUROSEMIDE  07/13/24  Yes O'Neal, Darryle Ned, MD  potassium chloride  (KLOR-CON ) 10 MEQ tablet Take 10 mEq by mouth daily. 07/13/24  Yes [provider]  prochlorperazine  (COMPAZINE ) 10 MG tablet Take 1 tablet (10 mg total) by mouth every 6 (six) hours as needed. 01/01/24  Yes Heilingoetter, Calton CROME, PA-C    Past Medical History: Past Medical History:  Diagnosis Date   Allergy    Contact lens/glasses fitting    Hypertension    Lung cancer [facility])     Past Surgical History: Past Surgical History:  Procedure Laterality Date   ABDOMINAL HYSTERECTOMY     BREAST SURGERY  06/10/2008   rt lumpectomy-neg   IR IMAGING GUIDED PORT INSERTION  12/18/2022   LAMINECTOMY N/A 09/21/2022   Procedure: THORACIC LAMINECTOMY FOR TUMOR;  Surgeon: Joshua Alm RAMAN, MD;  Location: [facility] OR;  Service: Neurosurgery;  Laterality: N/A;   OPEN REDUCTION INTERNAL FIXATION (ORIF) DISTAL RADIAL FRACTURE  Left 11/26/2013   Procedure: OPEN REDUCTION INTERNAL FIXATION (ORIF) LEFT  DISTAL RADIUS ;  Surgeon: Dempsey JINNY Sensor, MD;  Location: [facility];  Service: Orthopedics;  Laterality: Left;  with block   TUBAL LIGATION      Family History: Family History  Problem Relation Age of Onset   Cancer Mother    Hypertension Father    Heart attack Father    Hypertension Sister    Hypertension Brother    Heart attack Brother    Cancer Daughter         thyroid    Diabetes Maternal Grandfather     Social History: Social History   Socioeconomic History   Marital status: Widowed    Spouse name: Not on file   Number of children: 3   Years of education: Not on file   Highest education level: Not on file  Occupational History   Occupation: Retired Airline Pilot  Tobacco Use   Smoking status: Former    Current packs/day: 1.00    Types: Cigarettes   Smokeless tobacco: Not on file  Substance and Sexual Activity   Alcohol  use: No    Comment: rare   Drug use: No   Sexual activity: Never  Other Topics Concern   Not on file  Social History Narrative   Not on file   Social Drivers of Health   Tobacco Use: Medium Risk (07/13/2024)   Patient History    Smoking Tobacco Use: Former    Smokeless Tobacco Use: Unknown    Passive Exposure: Not on Actuary Strain: Not on file  Food Insecurity: No Food Insecurity (07/16/2024)   Epic    Worried About Radiation Protection Practitioner of Food in the Last Year: Never true    Ran Out of Food in the Last Year: Never true  Transportation Needs: No Transportation Needs (07/16/2024)   Epic    Lack of Transportation (Medical): No    Lack of Transportation (Non-Medical): No  Physical Activity: Not on file  Stress: Not on file  Social Connections: Moderately Isolated (07/16/2024)   Social Connection and Isolation Panel    Frequency of Communication with Friends and Family: Once a week    Frequency of Social Gatherings with Friends and Family: Once a week    Attends Religious Services: 1 to 4 times per year    Active Member of [employer] or Organizations: Yes    Attends Banker Meetings: 1 to 4 times per year    Marital Status: Widowed  Depression (PHQ2-9): Low Risk (07/08/2024)   Depression (PHQ2-9)    PHQ-2 Score: 0  Alcohol  Screen: Not on file  Housing: Low Risk (07/16/2024)   Epic    Unable to Pay for Housing in the Last Year: No    Number of Times Moved in the Last Year: 0    Homeless in the Last  Year: No  Utilities: Not At Risk (07/16/2024)   Epic    Threatened with loss of utilities: No  Health Literacy: Not on file    Allergies:  Allergies[1]  Objective:   Vital Signs:   Temp:  [97.6 F (36.4 C)-98.4 F (36.9 C)] 98.4 F (36.9 C) (02/06 1100) Pulse Rate:  [0-143] 84 (02/06 1315) Resp:  [12-30] 16 (02/06 1315) BP: (74-120)/(57-86) 100/57 (02/06 1315) SpO2:  [73 %-100 %] 95 % (02/06 1315) Weight:  [68.5 kg-70.8 kg] 70.8 kg (02/06 0921)    Weight change: Filed Weights   07/16/24 9347 07/16/24 9078  Weight: 68.5 kg 70.8 kg    Intake/Output:   Intake/Output Summary (Last 24 hours) at 07/16/2024 1450 Last data filed at 07/16/2024 1055 Gross per 24 hour  Intake 500 ml  Output --  Net 500 ml    Physical Exam  General:  well appearing.   Cor: Regular rate & rhythm. No murmurs. JVD not elevated, + pericardial drain   Lungs: clear Extremities: no edema   Telemetry   NSR 90s, personally reviewed   EKG    NSR low voltage, 90 bpm, personally reviewed   Labs   Basic Metabolic Panel: Recent Labs  Lab 07/16/24 1041  CREATININE 0.62    Liver Function Tests: No results for input(s): AST, ALT, ALKPHOS, BILITOT, PROT, ALBUMIN  in the last 168 hours. No results for input(s): LIPASE, AMYLASE in the last 168 hours. No results for input(s): AMMONIA in the last 168 hours.  CBC: Recent Labs  Lab 07/16/24 1041  WBC 1.5*  HGB 10.7*  HCT 33.3*  MCV 91.7  PLT 91*    Cardiac Enzymes: No results for input(s): CKTOTAL, CKMB, CKMBINDEX, TROPONINI in the last 168 hours.  BNP: BNP (last 3 results) Recent Labs    07/13/24 1529  BNP 79.1    ProBNP (last 3 results) No results for input(s): PROBNP in the last 8760 hours.   CBG: No results for input(s): GLUCAP in the last 168 hours.  Coagulation Studies: No results for input(s): LABPROT, INR in the last 72 hours.   Imaging   ECHOCARDIOGRAM LIMITED Result Date:  07/16/2024    ECHOCARDIOGRAM LIMITED REPORT   Patient Name:   Hayley Wu Date of Exam: 07/16/2024 Medical Rec #:  [id number]       Height:       63.0 in Accession #:    [id number]      Weight:       151.0 lb Date of Birth:  [birth year]      BSA:          1.716 m Patient Age:    73 years        BP:           119/81 mmHg Patient Gender: F               HR:           98 bpm. Exam Location:  Inpatient Procedure: Limited Echo (Both Spectral and Color Flow Doppler were utilized            during procedure). Indications:    Pericardial effusion  History:        Patient has prior history of Echocardiogram examinations, most                 recent 07/13/2024. Risk Factors:Hypertension.  Sonographer:    Philomena Daring Referring Phys: NEWMAN PARAS PATWARDHAN  Sonographer Comments: Pericardiocentesis. 5700 cc was removed. IMPRESSIONS  1. Limited echo for pericardiocentesis guidance. Large pericardial effusion measures 2.8cm adjacent to RV free wall. Successful pericardiocentesis, on final image small effusion remains FINDINGS  Left Ventricle: Pericardium: A large pericardial effusion is present. Lonni Nanas MD Electronically signed by Lonni Nanas MD Signature Date/Time: 07/16/2024/10:26:43 AM    Final    CARDIAC CATHETERIZATION Result Date: 07/16/2024 Successful pericardiocentesis Finding 70 cc of serous pericardial fluid drained and sent to the lab Admit to 2 heart with pericardial drain in space. Recommend limited echocardiogram on 07/17/2024 AM and remove pericardial drain as clinically appropriate. Patient is on Eliquis  at baseline due  to history of DVT and lung cancer.  Okay to resume Eliquis  on 07/17/2018 6 AM if no further procedure anticipated. Until then, reasonable to use subcu heparin  alone without IV heparin . Newman JINNY Lawrence, MD    Medications:   Current Medications:  [START ON 07/17/2024] heparin   5,000 Units Subcutaneous Q8H   sodium chloride  flush  3 mL Intravenous Q12H    Infusions:  sodium  chloride     Patient Profile   74 year old female w/ stage IV metastatic lung cancer and h/o DVT (on Eliquis ), who was referred to outpatient cardiology, by her oncologist, for further evaluation of a large pericardial effusion, now s/p pericardiocentesis.    Assessment/Plan   1. Large Pericardial Effusion  - noted on CT, confirmed by echo  - s/p pericardiocenteis 2/5; 570 cc of clear pericardial fluid was drained - post procedural limited echo w/ small residual effusion; hemodynamics stable   - suspect malignant in the setting of known stage IV metastatic lung cancer but will await cytology  - keep drain until output < 50 cc in 24 hr - f/u echo in AM   2. Stage IV Lung Cancer - followed by oncology   3. H/o DVT - occurred in 2024   - on Eliquis  PTA for secondary prevention, held for pericardicentesis  - for now, continue SQ heparin  for DVT prophylaxis - per IC, ok to resume Eliquis  on 07/17/2018 6 AM if no further procedure anticipated   Pt to remain in CCU until drain output < 50 cc/24 hrs  CRITICAL CARE Performed by: Caffie Shed   Total critical care time: 10 minutes  Critical care time was exclusive of separately billable procedures and treating other patients.  Critical care was necessary to treat or prevent imminent or life-threatening deterioration.  Critical care was time spent personally by me on the following activities: development of treatment plan with patient and/or surrogate as well as nursing, discussions with consultants, evaluation of patient's response to treatment, examination of patient, obtaining history from patient or surrogate, ordering and performing treatments and interventions, ordering and review of laboratory studies, ordering and review of radiographic studies, pulse oximetry and re-evaluation of patient's condition.    Length of Stay: 0  Caffie Shed, PA-C  07/16/2024, 2:50 PM  Advanced Heart Failure Team Pager [phone] (M-F; 7a -  5p)   Please visit [email or web address]: For overnight coverage please call cardiology fellow first. If fellow not available call Shock/ECMO MD on call.  For ECMO / Mechanical Support (Impella, IABP, LVAD) issues call Shock / ECMO MD on call.   Agree with above  70 y/o woman with metastatic lung CA admitted to ICU after drainage of pericardial effusion   570 cc of clear pericardial fluid was drained  Feels ok. Denies CP or SOB  She has steady drainage from her pericardium   General:  Sitting up in bed. No resp difficulty HEENT: normal Neck: supple. no JVD.  Cor: Regular rate & rhythm. No rubs, gallops or murmurs. + drain  Lungs: clear Abdomen: soft, nontender, nondistended.Good bowel sounds. Extremities: no cyanosis, clubbing, rash, edema Neuro: alert & orientedx3, cranial nerves grossly intact. moves all 4 extremities w/o difficulty. Affect pleasant  Will keep drain in until drainage < 50cc/24hr. Await serology but suspect it will be malignant in nature and she is likely at high risk for reaccumulation.  CRITICAL CARE Performed by: Cherrie Sieving  Total critical care time: 33 minutes  Critical care time was exclusive of separately billable procedures  and treating other patients.  Critical care was necessary to treat or prevent imminent or life-threatening deterioration.  Critical care was time spent personally by me (independent of midlevel providers or residents) on the following activities: development of treatment plan with patient and/or surrogate as well as nursing, discussions with consultants, evaluation of patient's response to treatment, examination of patient, obtaining history from patient or surrogate, ordering and performing treatments and interventions, ordering and review of laboratory studies, ordering and review of radiographic studies, pulse oximetry and re-evaluation of patient's condition.  Irwin Toran, MD  4:15 PM       [1]  Allergies Allergen  Reactions   Erythromycin Diarrhea   Amoxicillin Rash   Latex Rash   Prednisone  Rash   "

## 2024-07-28 ENCOUNTER — Ambulatory Visit: Admitting: Cardiovascular Disease

## 2024-07-29 ENCOUNTER — Inpatient Hospital Stay: Admitting: Internal Medicine

## 2024-07-29 ENCOUNTER — Inpatient Hospital Stay

## 2024-07-29 ENCOUNTER — Inpatient Hospital Stay: Attending: Internal Medicine

## 2024-08-19 ENCOUNTER — Inpatient Hospital Stay

## 2024-08-19 ENCOUNTER — Inpatient Hospital Stay: Attending: Internal Medicine

## 2024-08-19 ENCOUNTER — Inpatient Hospital Stay: Admitting: Physician Assistant

## 2024-09-09 ENCOUNTER — Inpatient Hospital Stay

## 2024-09-09 ENCOUNTER — Inpatient Hospital Stay: Attending: Internal Medicine

## 2024-09-09 ENCOUNTER — Inpatient Hospital Stay: Admitting: Internal Medicine

## 2024-09-30 ENCOUNTER — Inpatient Hospital Stay

## 2024-09-30 ENCOUNTER — Inpatient Hospital Stay: Admitting: Internal Medicine
